# Patient Record
Sex: Male | Born: 1995 | Race: White | Hispanic: No | Marital: Single | Smoking: Never smoker
Health system: Southern US, Community
[De-identification: ages and names within clinical notes are randomized; demographics above are authoritative.]

## PROBLEM LIST (undated history)

## (undated) DIAGNOSIS — F840 Autistic disorder: Secondary | ICD-10-CM

## (undated) DIAGNOSIS — G4730 Sleep apnea, unspecified: Secondary | ICD-10-CM

## (undated) DIAGNOSIS — E669 Obesity, unspecified: Secondary | ICD-10-CM

## (undated) DIAGNOSIS — K859 Acute pancreatitis without necrosis or infection, unspecified: Secondary | ICD-10-CM

## (undated) DIAGNOSIS — R7303 Prediabetes: Secondary | ICD-10-CM

## (undated) DIAGNOSIS — I10 Essential (primary) hypertension: Secondary | ICD-10-CM

## (undated) DIAGNOSIS — R569 Unspecified convulsions: Secondary | ICD-10-CM

## (undated) DIAGNOSIS — F952 Tourette's disorder: Secondary | ICD-10-CM

## (undated) DIAGNOSIS — K760 Fatty (change of) liver, not elsewhere classified: Secondary | ICD-10-CM

## (undated) DIAGNOSIS — G2569 Other tics of organic origin: Secondary | ICD-10-CM

## (undated) DIAGNOSIS — J189 Pneumonia, unspecified organism: Secondary | ICD-10-CM

## (undated) HISTORY — DX: Other tics of organic origin: G25.69

## (undated) HISTORY — PX: TONSILLECTOMY: SUR1361

## (undated) HISTORY — DX: Autistic disorder: F84.0

---
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
## 2018-08-15 ENCOUNTER — Other Ambulatory Visit: Payer: Self-pay

## 2018-08-15 DIAGNOSIS — Z20822 Contact with and (suspected) exposure to covid-19: Secondary | ICD-10-CM

## 2018-08-17 LAB — NOVEL CORONAVIRUS, NAA: SARS-CoV-2, NAA: NOT DETECTED

## 2018-08-22 ENCOUNTER — Telehealth: Payer: Self-pay

## 2018-08-22 NOTE — Telephone Encounter (Signed)
Patient's mother advised his COVID 65 test result is negative.

## 2018-10-31 ENCOUNTER — Encounter: Payer: Self-pay | Admitting: Gastroenterology

## 2018-11-06 ENCOUNTER — Telehealth: Payer: Self-pay | Admitting: Gastroenterology

## 2018-11-06 NOTE — Telephone Encounter (Signed)
Attempted to reach patient/mother at phone number given-no answer on [phone].  Attempted to reach patient/mother at phone number given-left message for patient/mother to call back to the office at [phone];

## 2018-11-06 NOTE — Telephone Encounter (Signed)
Thanks for the heads up.  Vivien Rota, it looks like you are working with me this Friday morning- any way you could request records form [facility] hospital? Thanks-JLL

## 2018-11-06 NOTE — Telephone Encounter (Signed)
Pt's guardian called again requesting for an appt ASAP.

## 2018-11-06 NOTE — Telephone Encounter (Signed)
Please review previous message and advise Note: APP's do not have any availability until later in the week next week (RN checked schedule)

## 2018-11-06 NOTE — Telephone Encounter (Signed)
Patient's mother-[name]- called into office- RN explained to her that the patient cannot be seen today or tomorrow as Dr. Lyndel Safe does not have any openings any sooner than her son is scheduled-offered for her son to be seen by an APP on 11/09/2018 at 10:30 -[name] was appreciative of this appt; [name] reports that Dr. Ovid Curd had referred her to our office due to the patient needing to be seen immediately;   RN also informed [name] that she is welcome to take her son the the [facility] ER to be seen for immediate evaluation if she feels he needs to be seen sooner; [name] reports she has taken her son to [facility] ER numerous times; [name] was informed that this appt w/ APP Anderson Malta is at the [facility] office; [name] verbalized understanding of information/instructions; [name] was advised to call back to the office if further questions/concerns arise;   Anderson Malta -FYI=please make sure you get the records from [facility] to review as the patient's mother is assertive with [place] "knowing what all has happened to her son while in the emergency department-he needs to have the light down his throat to see what is going on-this is what Dr. Ovid Curd said he needed";

## 2018-11-06 NOTE — Telephone Encounter (Signed)
Referral MD (Dr. Lerry Paterson) requested to speak to Dr. Lyndel Safe regarding pt.  [phone]

## 2018-11-06 NOTE — Telephone Encounter (Signed)
Pt's mother called and said that her son has been vomiting all day and that there is a hernia- Not using the restroom and has lost weight. She took him to the emergency room this past week and that they did not really help him. She is trying to see if he can be seen today- she said she is worried about him and really wants him seen now that she does not think he will be okay til the 29th to his appt.

## 2018-11-07 ENCOUNTER — Encounter (HOSPITAL_COMMUNITY): Payer: Self-pay

## 2018-11-07 ENCOUNTER — Other Ambulatory Visit: Payer: Self-pay

## 2018-11-07 ENCOUNTER — Telehealth: Payer: Self-pay | Admitting: Gastroenterology

## 2018-11-07 ENCOUNTER — Observation Stay (HOSPITAL_COMMUNITY)
Admission: EM | Admit: 2018-11-07 | Discharge: 2018-11-08 | Disposition: A | Discharge status: Home / Self Care | Payer: Medicaid Other | Attending: Internal Medicine | Admitting: Internal Medicine

## 2018-11-07 DIAGNOSIS — E860 Dehydration: Secondary | ICD-10-CM | POA: Diagnosis not present

## 2018-11-07 DIAGNOSIS — F79 Unspecified intellectual disabilities: Secondary | ICD-10-CM | POA: Insufficient documentation

## 2018-11-07 DIAGNOSIS — K2100 Gastro-esophageal reflux disease with esophagitis, without bleeding: Secondary | ICD-10-CM | POA: Insufficient documentation

## 2018-11-07 DIAGNOSIS — Z6839 Body mass index (BMI) 39.0-39.9, adult: Secondary | ICD-10-CM | POA: Insufficient documentation

## 2018-11-07 DIAGNOSIS — I10 Essential (primary) hypertension: Secondary | ICD-10-CM | POA: Insufficient documentation

## 2018-11-07 DIAGNOSIS — Z8249 Family history of ischemic heart disease and other diseases of the circulatory system: Secondary | ICD-10-CM | POA: Diagnosis not present

## 2018-11-07 DIAGNOSIS — K5900 Constipation, unspecified: Secondary | ICD-10-CM | POA: Diagnosis not present

## 2018-11-07 DIAGNOSIS — R1110 Vomiting, unspecified: Secondary | ICD-10-CM | POA: Diagnosis present

## 2018-11-07 DIAGNOSIS — G4730 Sleep apnea, unspecified: Secondary | ICD-10-CM | POA: Diagnosis not present

## 2018-11-07 DIAGNOSIS — Z79899 Other long term (current) drug therapy: Secondary | ICD-10-CM | POA: Insufficient documentation

## 2018-11-07 DIAGNOSIS — F840 Autistic disorder: Secondary | ICD-10-CM | POA: Diagnosis not present

## 2018-11-07 DIAGNOSIS — R748 Abnormal levels of other serum enzymes: Secondary | ICD-10-CM | POA: Diagnosis not present

## 2018-11-07 DIAGNOSIS — K228 Other specified diseases of esophagus: Secondary | ICD-10-CM | POA: Diagnosis not present

## 2018-11-07 DIAGNOSIS — R1084 Generalized abdominal pain: Secondary | ICD-10-CM | POA: Insufficient documentation

## 2018-11-07 DIAGNOSIS — Z20828 Contact with and (suspected) exposure to other viral communicable diseases: Secondary | ICD-10-CM | POA: Insufficient documentation

## 2018-11-07 DIAGNOSIS — E669 Obesity, unspecified: Secondary | ICD-10-CM | POA: Diagnosis not present

## 2018-11-07 DIAGNOSIS — K760 Fatty (change of) liver, not elsewhere classified: Secondary | ICD-10-CM | POA: Diagnosis not present

## 2018-11-07 DIAGNOSIS — G40909 Epilepsy, unspecified, not intractable, without status epilepticus: Secondary | ICD-10-CM | POA: Insufficient documentation

## 2018-11-07 DIAGNOSIS — R451 Restlessness and agitation: Secondary | ICD-10-CM | POA: Diagnosis not present

## 2018-11-07 DIAGNOSIS — R112 Nausea with vomiting, unspecified: Principal | ICD-10-CM | POA: Insufficient documentation

## 2018-11-07 LAB — CBC WITH DIFFERENTIAL/PLATELET
Abs Immature Granulocytes: 0.01 10*3/uL (ref 0.00–0.07)
Basophils Absolute: 0 10*3/uL (ref 0.0–0.1)
Basophils Relative: 1 %
Eosinophils Absolute: 0.1 10*3/uL (ref 0.0–0.5)
Eosinophils Relative: 1 %
HCT: 45.1 % (ref 39.0–52.0)
Hemoglobin: 14.4 g/dL (ref 13.0–17.0)
Immature Granulocytes: 0 %
Lymphocytes Relative: 41 %
Lymphs Abs: 3.1 10*3/uL (ref 0.7–4.0)
MCH: 27.7 pg (ref 26.0–34.0)
MCHC: 31.9 g/dL (ref 30.0–36.0)
MCV: 86.7 fL (ref 80.0–100.0)
Monocytes Absolute: 0.7 10*3/uL (ref 0.1–1.0)
Monocytes Relative: 10 %
Neutro Abs: 3.5 10*3/uL (ref 1.7–7.7)
Neutrophils Relative %: 47 %
Platelets: 484 10*3/uL — ABNORMAL HIGH (ref 150–400)
RBC: 5.2 MIL/uL (ref 4.22–5.81)
RDW: 12.3 % (ref 11.5–15.5)
WBC: 7.5 10*3/uL (ref 4.0–10.5)
nRBC: 0 % (ref 0.0–0.2)

## 2018-11-07 LAB — URINALYSIS, ROUTINE W REFLEX MICROSCOPIC
Bacteria, UA: NONE SEEN
Glucose, UA: NEGATIVE mg/dL
Hgb urine dipstick: NEGATIVE
Ketones, ur: NEGATIVE mg/dL
Leukocytes,Ua: NEGATIVE
Nitrite: NEGATIVE
Protein, ur: 30 mg/dL — AB
Specific Gravity, Urine: 1.027 (ref 1.005–1.030)
pH: 5 (ref 5.0–8.0)

## 2018-11-07 LAB — COMPREHENSIVE METABOLIC PANEL
ALT: 98 U/L — ABNORMAL HIGH (ref 0–44)
AST: 65 U/L — ABNORMAL HIGH (ref 15–41)
Albumin: 4.5 g/dL (ref 3.5–5.0)
Alkaline Phosphatase: 79 U/L (ref 38–126)
Anion gap: 12 (ref 5–15)
BUN: 8 mg/dL (ref 6–20)
CO2: 26 mmol/L (ref 22–32)
Calcium: 9.5 mg/dL (ref 8.9–10.3)
Chloride: 99 mmol/L (ref 98–111)
Creatinine, Ser: 0.81 mg/dL (ref 0.61–1.24)
GFR calc Af Amer: >60 mL/min (ref >60)
GFR calc non Af Amer: >60 mL/min (ref >60)
Glucose, Bld: 107 mg/dL — ABNORMAL HIGH (ref 70–99)
Potassium: 3.9 mmol/L (ref 3.5–5.1)
Sodium: 137 mmol/L (ref 135–145)
Total Bilirubin: 1.3 mg/dL — ABNORMAL HIGH (ref 0.3–1.2)
Total Protein: 7.6 g/dL (ref 6.5–8.1)

## 2018-11-07 LAB — SARS CORONAVIRUS 2 (TAT 6-24 HRS): SARS Coronavirus 2: NEGATIVE

## 2018-11-07 LAB — LIPASE, BLOOD: Lipase: 21 U/L (ref 11–51)

## 2018-11-07 MED ORDER — SODIUM CHLORIDE 0.9 % IV SOLN
100.0000 mg | Freq: Two times a day (BID) | INTRAVENOUS | Status: DC
  Administered 2018-11-07 – 2018-11-08 (×2): 100 mg via INTRAVENOUS
  Filled 2018-11-07 (×4): qty 10

## 2018-11-07 MED ORDER — ENOXAPARIN SODIUM 40 MG/0.4ML ~~LOC~~ SOLN
40.0000 mg | SUBCUTANEOUS | Status: DC
  Administered 2018-11-07: 40 mg via SUBCUTANEOUS
  Filled 2018-11-07: qty 0.4

## 2018-11-07 MED ORDER — LORAZEPAM 2 MG/ML IJ SOLN: 1.0000 mg | INTRAMUSCULAR | Status: DC | PRN

## 2018-11-07 MED ORDER — CLOBAZAM 10 MG PO TABS
60.0000 mg | ORAL_TABLET | Freq: Two times a day (BID) | ORAL | Status: DC
  Administered 2018-11-07 – 2018-11-08 (×2): 60 mg via ORAL
  Filled 2018-11-07 (×2): qty 6

## 2018-11-07 MED ORDER — ONDANSETRON HCL 4 MG/2ML IJ SOLN
4.0000 mg | Freq: Once | INTRAMUSCULAR | Status: AC
  Administered 2018-11-07: 4 mg via INTRAVENOUS
  Filled 2018-11-07: qty 2

## 2018-11-07 MED ORDER — ONDANSETRON HCL 4 MG/2ML IJ SOLN: 4.0000 mg | Freq: Four times a day (QID) | INTRAMUSCULAR | Status: DC | PRN

## 2018-11-07 MED ORDER — PANTOPRAZOLE SODIUM 40 MG IV SOLR
40.0000 mg | INTRAVENOUS | Status: DC
  Administered 2018-11-07: 40 mg via INTRAVENOUS
  Filled 2018-11-07: qty 40

## 2018-11-07 MED ORDER — LORAZEPAM 2 MG/ML IJ SOLN
1.0000 mg | INTRAMUSCULAR | Status: DC | PRN
  Administered 2018-11-07 – 2018-11-08 (×3): 1 mg via INTRAVENOUS
  Filled 2018-11-07 (×3): qty 1

## 2018-11-07 MED ORDER — SODIUM CHLORIDE 0.9 % IV SOLN
INTRAVENOUS | Status: AC
  Administered 2018-11-07 – 2018-11-08 (×2): via INTRAVENOUS

## 2018-11-07 MED ORDER — HYDROMORPHONE HCL 1 MG/ML IJ SOLN
1.0000 mg | Freq: Once | INTRAMUSCULAR | Status: AC
  Administered 2018-11-07: 1 mg via INTRAVENOUS
  Filled 2018-11-07: qty 1

## 2018-11-07 MED ORDER — SODIUM CHLORIDE 0.9 % IV BOLUS
1000.0000 mL | Freq: Once | INTRAVENOUS | Status: AC
  Administered 2018-11-07: 15:00:00 1000 mL via INTRAVENOUS

## 2018-11-07 MED ORDER — SODIUM CHLORIDE 0.9 % IV BOLUS
1000.0000 mL | Freq: Once | INTRAVENOUS | Status: AC
  Administered 2018-11-07: 1000 mL via INTRAVENOUS

## 2018-11-07 MED ORDER — DIAZEPAM 20 MG RE GEL: 20.0000 mg | Freq: Every day | RECTAL | Status: DC | PRN

## 2018-11-07 NOTE — Consult Note (Signed)
Referring Provider:   [facility]        Primary Care Physician:  [facility] Primary Gastroenterologist:  Has appt with [place] this week ( establish care)           Reason for Consultation:    Abdominal pain, nausea, vomiting.                ASSESSMENT /  PLAN    21. 23 yo male with severe autism, mental retardation, seizure disorder, obesity in ED with a one month history of progressive nausea, vomiting, generalized abdominal pain and constipation. Two CT scans at outside facility have been unremarkable. Lipase normal. Mild elevation in liver tests, likely unrelated especially given unremarkable CT scan. Hospitalist has obtained records and will be in hard chart.  --Etiology of symptoms unclear at this point. Will proceed with EGD tomorrow. The risks and benefits of EGD were discussed with the patient's Mother and she agrees to proceed.  -Need to address constipation but it will be difficult right now. He isn't tolerating PO and furthermore he won't drink Miralax. We could crush Senokot and put in fluid which mother thinks might work (when he is tolerating PO).  --prn anti-emetics. --Spoke with Hospitalist and he will order anxiolytic to use as needed. Patient is pulling at IV, he is agitated.  --IV hydration. Received 2 L bolus, getting 125 ml / hr maintenance.   2. Abnormal liver tests, mild. Tbili 1.3, mildly elevated transaminases. History of hepatic steatosis.  --Repeat liver tests tomorrow.  3. Hypertension. Home BP meds on hold. Normotensive at present.   4. Hx of seizures, on antiepileptic at home. Need IV seizure med while here? Will ask Hospitalist  HPI:     Aaron Vega is a 23 y.o. male with a PMH of severe autism / retardation, seizure disorder,  Obesity, pancreatitis at age 45. He is unable to verbally communicate. Mother at bedside. A month ago patient started having intermittent nausea, vomiting. Mother detected that he was having abdominal  pain and around the same time was struggling with constipation. He would typically have 1-2 BMs a day but over the last month has been having a small BM every several days. On the toilet patient is sticking finger down throat to induce vomiting. She has used suppositories for the constipation. but they are non-productive. His symptoms have gotten progressively worse over the last month. He has had two CT scans this month ( outside facility) which have been unremarkable. Patient has an appt with [place] in two days but due to inability to keep anything down Mother brought him to ED.     Past Medical History:  Diagnosis Date  . Autistic disorder, current or active state   . Fatty liver   . Hypertension   . Obesity   . Pancreatitis    at age 36 years  . Pre-diabetes   . Seizures ([place])    as of 11/23/15 - no seizures for 3 month  . Sleep apnea    not on cpap (can't tolerate)  . Tics of organic origin   . Tourette syndrome     Past Surgical History:  Procedure Laterality Date  . RADIOLOGY WITH ANESTHESIA N/A 11/24/2015   Procedure: CT SCAN ABDOMEN AND PELVIS WITH CONTRAST;  Surgeon: Medication Radiologist, MD;  Location: [facility];  Service: Radiology;  Laterality: N/A;  . TONSILLECTOMY     and adenoidectomy    Prior to Admission medications   Medication Sig Start Date  End Date Taking? Authorizing Provider  citalopram (CELEXA) 40 MG tablet Take 40 mg by mouth daily. 04/24/14  Yes [provider]  cloBAZam (ONFI) 20 MG tablet Take 60 mg by mouth 2 (two) times daily. 10/16/18  Yes [provider]  diazepam (DIASAT) 20 MG GEL Place 20 mg rectally daily as needed (for seizures lasting more than 5 minutes.).  07/03/13  Yes [provider]  diazepam (VALIUM) 5 MG tablet Take 7.5 mg by mouth every morning. May give 0.5 tablet (2.5mg ) as needed for agitation/anxiety 10/15/18  Yes [provider]  lisinopril (PRINIVIL,ZESTRIL) 20 MG tablet Take 20 mg by mouth daily. Take  with 5mg  tablet for a total dose of 25mg  04/15/14  Yes [provider]  lisinopril (ZESTRIL) 5 MG tablet Take 5 mg by mouth daily. Take with 20mg  tablet for a total dose of 25mg  10/14/18  Yes [provider]  Melatonin 5 MG TABS Take 5 mg by mouth at bedtime.   Yes [provider]  Midazolam (NAYZILAM) 5 MG/0.1ML SOLN Place 5 mg into the nose daily as needed (seizure).   Yes [provider]  nystatin (MYCOSTATIN/NYSTOP) 100000 UNIT/GM POWD Apply 1 application topically daily. Neck, crevices, etc. 04/23/14  Yes [provider]  VIMPAT 10 MG/ML oral solution Take 10 mLs by mouth 2 (two) times daily. 10/28/18  Yes [provider]    Current Facility-Administered Medications  Medication Dose Route Frequency Provider Last Rate Last Dose  . 0.9 %  sodium chloride infusion   Intravenous Continuous Dhungel, Nishant, MD      . LORazepam (ATIVAN) injection 1 mg  1 mg Intravenous Q4H PRN Dhungel, Nishant, MD       Current Outpatient Medications  Medication Sig Dispense Refill  . citalopram (CELEXA) 40 MG tablet Take 40 mg by mouth daily.  4  . cloBAZam (ONFI) 20 MG tablet Take 60 mg by mouth 2 (two) times daily.    . diazepam (DIASAT) 20 MG GEL Place 20 mg rectally daily as needed (for seizures lasting more than 5 minutes.).     Marland Kitchen. diazepam (VALIUM) 5 MG tablet Take 7.5 mg by mouth every morning. May give 0.5 tablet (2.5mg ) as needed for agitation/anxiety    . lisinopril (PRINIVIL,ZESTRIL) 20 MG tablet Take 20 mg by mouth daily. Take with 5mg  tablet for a total dose of 25mg   9  . lisinopril (ZESTRIL) 5 MG tablet Take 5 mg by mouth daily. Take with 20mg  tablet for a total dose of 25mg     . Melatonin 5 MG TABS Take 5 mg by mouth at bedtime.    . Midazolam (NAYZILAM) 5 MG/0.1ML SOLN Place 5 mg into the nose daily as needed (seizure).    . nystatin (MYCOSTATIN/NYSTOP) 100000 UNIT/GM POWD Apply 1 application topically daily. Neck, crevices, etc.  2  . VIMPAT  10 MG/ML oral solution Take 10 mLs by mouth 2 (two) times daily.      Allergies as of 11/07/2018  . (No Known Allergies)    Family History  Problem Relation Age of Onset  . Seizures Maternal Aunt   . Anxiety disorder Paternal Uncle   . Depression Paternal Uncle   . Bipolar disorder Paternal Uncle   . Seizures Cousin        Maternal 1st and 2nd cousins have seizures  . Anxiety disorder Other        Paternal fhx  . Depression Other        Paternal fhx  .  Hypertension Other        Paternal fhx  . Bipolar disorder Other        Paternal fhx    Social History   Socioeconomic History  . Marital status: Single    Spouse name: Not on file  . Number of children: Not on file  . Years of education: Not on file  . Highest education level: Not on file  Occupational History  . Not on file  Social Needs  . Financial resource strain: Not on file  . Food insecurity    Worry: Not on file    Inability: Not on file  . Transportation needs    Medical: Not on file    Non-medical: Not on file  Tobacco Use  . Smoking status: Never Smoker  . Smokeless tobacco: Never Used  Substance and Sexual Activity  . Alcohol use: Never    Frequency: Never  . Drug use: Never  . Sexual activity: Not on file  Lifestyle  . Physical activity    Days per week: Not on file    Minutes per session: Not on file  . Stress: Not on file  Relationships  . Social Musician on phone: Not on file    Gets together: Not on file    Attends religious service: Not on file    Active member of club or organization: Not on file    Attends meetings of clubs or organizations: Not on file    Relationship status: Not on file  . Intimate partner violence    Fear of current or ex partner: Not on file    Emotionally abused: Not on file    Physically abused: Not on file    Forced sexual activity: Not on file  Other Topics Concern  . Not on file  Social History Narrative  . Not on file    Review of  Systems: All systems reviewed and negative except where noted in HPI.  Physical Exam: Vital signs in last 24 hours: Temp:  [99.3 F (37.4 C)] 99.3 F (37.4 C) (10/14 1110) Pulse Rate:  [104-124] 104 (10/14 1536) Resp:  [16-22] 22 (10/14 1536) BP: (123-149)/(56-93) 123/93 (10/14 1536) SpO2:  [98 %-100 %] 98 % (10/14 1536)   General:   Alert, obese male in NAD Psych:  cooperative. Follows most commands but seems agitated Eyes:  Pupils equal, sclera clear, no icterus.   Conjunctiva pink. Ears:  Normal auditory acuity. Nose:  No deformity, discharge,  or lesions. Neck:  Supple; no masses Lungs:  Clear throughout to auscultation.   No wheezes, crackles, or rhonchi.  Heart:  Regular rate and rhythm; no murmurs, no lower extremity edema Abdomen:  Soft, obese, non-distended, appears nontender, BS active, no palp mass   Rectal:  Deferred  Msk:  Symmetrical without gross deformities. . Neurologic:  Alert and  oriented x4;  grossly normal neurologically. Skin:  Intact without significant lesions or rashes.   Intake/Output from previous day: No intake/output data recorded. Intake/Output this shift: Total I/O In: 1000 [IV Piggyback:1000] Out: -   Lab Results: Recent Labs    11/07/18 1253  WBC 7.5  HGB 14.4  HCT 45.1  PLT 484*   BMET Recent Labs    11/07/18 1253  NA 137  K 3.9  CL 99  CO2 26  GLUCOSE 107*  BUN 8  CREATININE 0.81  CALCIUM 9.5   LFT Recent Labs    11/07/18 1253  PROT 7.6  ALBUMIN 4.5  AST 65*  ALT 98*  ALKPHOS 79  BILITOT 1.3*   PT/INR No results for input(s): LABPROT, INR in the last 72 hours. Hepatitis Panel No results for input(s): HEPBSAG, HCVAB, HEPAIGM, HEPBIGM in the last 72 hours.   . CBC Latest Ref Rng & Units 11/07/2018 11/24/2015 05/03/2014  WBC 4.0 - 10.5 K/uL 7.5 9.3 15.5(H)  Hemoglobin 13.0 - 17.0 g/dL 64.6 12.8(L) 12.5  Hematocrit 39.0 - 52.0 % 45.1 39.2 39.0  Platelets 150 - 400 K/uL 484(H) 348 430(H)    . CMP Latest  Ref Rng & Units 11/07/2018 11/24/2015 05/03/2014  Glucose 70 - 99 mg/dL 803(O) 122(Q) 825(O)  BUN 6 - 20 mg/dL 8 9 11   Creatinine 0.61 - 1.24 mg/dL 0.37) 0.48(G  Sodium 135 - 145 mmol/L 137 137 138  Potassium 3.5 - 5.1 mmol/L 3.9 3.7 3.7  Chloride 98 - 111 mmol/L 99 106 107  CO2 22 - 32 mmol/L 26 25 18(L)  Calcium 8.9 - 10.3 mg/dL 9.5 9.5 9.6  Total Protein 6.5 - 8.1 g/dL 7.6 - 6.7  Total Bilirubin 0.3 - 1.2 mg/dL 8.91) - 0.3  Alkaline Phos 38 - 126 U/L 79 - 108  AST 15 - 41 U/L 65(H) - 31  ALT 0 - 44 U/L 98(H) - 47   Studies/Results: No results found.  Active Problems:   Autistic disorder   Intractable nausea and vomiting   Obesity (BMI 30-39.9)   Seizure disorder (HCC)    6.9(I, NP-C @  11/07/2018, 4:58 PM

## 2018-11-07 NOTE — Telephone Encounter (Signed)
Attempted to reach patient/patient's mother at 1:18 pm- no answer (not a working number)-[phone]

## 2018-11-07 NOTE — ED Provider Notes (Signed)
[facility] DEPT Provider Note   CSN: [id number] Arrival date & time: 11/07/18  1036   LEVEL 5 CAVEAT - AUTISM/MENTAL DELAY  History   Chief Complaint Chief Complaint  Patient presents with  . Emesis  . Abdominal Pain    HPI Aaron Vega is a 23 y.o. male.     HPI  23 year old male presents with vomiting.  History is taken from mom based on the patient's autism disorder.  Patient has been sick for a couple months with progressively worsening vomiting.  He has been vomiting during these last couple months but now it is becoming pretty much constant.  He often is sticking his finger down his throat to vomit.  He is only had one small bowel movement in the last few weeks.  Has been to [facility] a couple times, most recently 4 days ago.  States he had a CT scan that showed an abdominal hernia but also a "blockage".  States that he was referred to gastroenterology, Dr. Lyndel Safe.  They are unable to see him until later this month.  Patient and family told to come to the ER and GI would see them here.  States next up would be endoscopy.  The patient is still passing gas. Has had low grade fevers. No cough. Mom states he's had multiple xrays and CT scans "from head to toe".  Past Medical History:  Diagnosis Date  . Autistic disorder, current or active state   . Fatty liver   . Hypertension   . Obesity   . Pancreatitis    at age 41 years  . Pre-diabetes   . Seizures ([place])    as of 11/23/15 - no seizures for 3 month  . Sleep apnea    not on cpap (can't tolerate)  . Tics of organic origin   . Tourette syndrome     Patient Active Problem List   Diagnosis Date Noted  . Intractable nausea and vomiting 11/07/2018  . Tics of organic origin 09/12/2012  . Autistic disorder, current or active state 09/12/2012    Past Surgical History:  Procedure Laterality Date  . RADIOLOGY WITH ANESTHESIA N/A 11/24/2015   Procedure: CT SCAN ABDOMEN AND PELVIS WITH  CONTRAST;  Surgeon: Medication Radiologist, MD;  Location: [facility];  Service: Radiology;  Laterality: N/A;  . TONSILLECTOMY     and adenoidectomy        Home Medications    Prior to Admission medications   Medication Sig Start Date End Date Taking? Authorizing Provider  citalopram (CELEXA) 40 MG tablet Take 40 mg by mouth daily. 04/24/14  Yes [provider]  cloBAZam (ONFI) 20 MG tablet Take 60 mg by mouth 2 (two) times daily. 10/16/18  Yes [provider]  diazepam (DIASAT) 20 MG GEL Place 20 mg rectally daily as needed (for seizures lasting more than 5 minutes.).  07/03/13  Yes [provider]  diazepam (VALIUM) 5 MG tablet Take 7.5 mg by mouth every morning. May give 0.5 tablet (2.5mg ) as needed for agitation/anxiety 10/15/18  Yes [provider]  lisinopril (PRINIVIL,ZESTRIL) 20 MG tablet Take 20 mg by mouth daily. Take with 5mg  tablet for a total dose of 25mg  04/15/14  Yes [provider]  lisinopril (ZESTRIL) 5 MG tablet Take 5 mg by mouth daily. Take with 20mg  tablet for a total dose of 25mg  10/14/18  Yes [provider]  Melatonin 5 MG TABS Take 5 mg by mouth at bedtime.   Yes [provider]  Midazolam (NAYZILAM) 5 MG/0.1ML SOLN Place 5 mg into the nose daily as needed (seizure).   Yes [provider]  nystatin (MYCOSTATIN/NYSTOP) 100000 UNIT/GM POWD Apply 1 application topically daily. Neck, crevices, etc. 04/23/14  Yes [provider]  VIMPAT 10 MG/ML oral solution Take 10 mLs by mouth 2 (two) times daily. 10/28/18  Yes [provider]    Family History Family History  Problem Relation Age of Onset  . Seizures Maternal Aunt   . Anxiety disorder Paternal Uncle   . Depression Paternal Uncle   . Bipolar disorder Paternal Uncle   . Seizures Cousin        Maternal 1st and 2nd cousins have seizures  . Anxiety disorder Other        Paternal fhx  . Depression Other        Paternal fhx  .  Hypertension Other        Paternal fhx  . Bipolar disorder Other        Paternal fhx    Social History Social History   Tobacco Use  . Smoking status: Never Smoker  . Smokeless tobacco: Never Used  Substance Use Topics  . Alcohol use: Not on file  . Drug use: Not on file     Allergies   Patient has no known allergies.   Review of Systems Review of Systems  Unable to perform ROS: Patient nonverbal     Physical Exam Updated Vital Signs BP (!) 128/56 (BP Location: Left Wrist)   Pulse (!) 118   Temp 99.3 F (37.4 C) (Axillary)   Resp 20   SpO2 98%   Physical Exam Vitals signs and nursing note reviewed.  Constitutional:      Appearance: He is well-developed. He is obese.  HENT:     Head: Normocephalic and atraumatic.     Right Ear: External ear normal.     Left Ear: External ear normal.     Nose: Nose normal.  Eyes:     General:        Right eye: No discharge.        Left eye: No discharge.  Neck:     Musculoskeletal: Neck supple.  Cardiovascular:     Rate and Rhythm: Regular rhythm. Tachycardia present.     Heart sounds: Normal heart sounds.  Pulmonary:     Effort: Pulmonary effort is normal.     Breath sounds: Normal breath sounds.  Abdominal:     Palpations: Abdomen is soft.     Tenderness: There is abdominal tenderness.     Comments: Limited exam, as patient resists during palpation but does grimace.  Skin:    General: Skin is warm and dry.  Neurological:     Mental Status: He is alert.  Psychiatric:        Mood and Affect: Mood is not anxious.      ED Treatments / Results  Labs (all labs ordered are listed, but only abnormal results are displayed) Labs Reviewed  COMPREHENSIVE METABOLIC PANEL - Abnormal; Notable for the following components:      Result Value   Glucose, Bld 107 (*)    AST 65 (*)    ALT 98 (*)    Total Bilirubin 1.3 (*)    All other components within normal limits  CBC WITH DIFFERENTIAL/PLATELET - Abnormal; Notable for  the following components:   Platelets 484 (*)    All other components within normal limits  SARS CORONAVIRUS 2 (TAT  6-24 HRS)  LIPASE, BLOOD  CBC WITH DIFFERENTIAL/PLATELET  URINALYSIS, ROUTINE W REFLEX MICROSCOPIC    EKG None  Radiology No results found.  Procedures Procedures (including critical care time)  Medications Ordered in ED Medications  sodium chloride 0.9 % bolus 1,000 mL (has no administration in time range)  sodium chloride 0.9 % bolus 1,000 mL (0 mLs Intravenous Stopped 11/07/18 1501)  ondansetron (ZOFRAN) injection 4 mg (4 mg Intravenous Given 11/07/18 1306)  HYDROmorphone (DILAUDID) injection 1 mg (1 mg Intravenous Given 11/07/18 1307)     Initial Impression / Assessment and Plan / ED Course  I have reviewed the triage vital signs and the nursing notes.  Pertinent labs & imaging results that were available during my care of the patient were reviewed by me and considered in my medical decision making (see chart for details).        Patient has not had any vomiting here.  I have reviewed his outside records, the patient has had 2 different CT scans, 10/10 and 10/5.  Both do not show any obstruction, obvious hernia, or acute abnormalities according to the reads.  I do not think he needs a third CT scan.  His labs are overall similar to outside records including mild LFT abnormalities.  I discussed with lobe our GI, Lucrezia Europe, who has arranged for patient to get an EGD tomorrow.  Dr. Gonzella Lex will admit.  He is noted to still be tachycardic despite resting comfortably after first liter of IV fluids I think there is some dehydration component and he will be given more fluids and observed.  Final Clinical Impressions(s) / ED Diagnoses   Final diagnoses:  Vomiting in adult  Dehydration    ED Discharge Orders    None       Pricilla Loveless, MD 11/07/18 [phone]

## 2018-11-07 NOTE — Telephone Encounter (Signed)
Records request sent to [facility] for all ER visits.

## 2018-11-07 NOTE — ED Notes (Signed)
ED TO INPATIENT HANDOFF REPORT  Name/Age/Gender Aaron Vega 23 y.o. male  Code Status   Home/SNF/Other Rehab  Chief Complaint constipation / emesis   Level of Care/Admitting Diagnosis ED Disposition    ED Disposition Condition Comment   Admit  [facility] [[id number]]  Level of Care: Med-Surg [16]  Covid Evaluation: N/A  Diagnosis: Intractable nausea and vomiting [720114]  Admitting Physician: Eddie NorthHUNGEL, NISHANT [phone][[id number]]  Attending Physician: Eddie NorthHUNGEL, NISHANT [[id number]]  PT Class (Do Not Modify): Observation [104]  PT Acc Code (Do Not Modify): Observation [10022]       Medical History Past Medical History:  Diagnosis Date  . Autistic disorder, current or active state   . Fatty liver   . Hypertension   . Obesity   . Pancreatitis    at age 28 years  . Pre-diabetes   . Seizures (HCC)    as of 11/23/15 - no seizures for 3 month  . Sleep apnea    not on cpap (can't tolerate)  . Tics of organic origin   . Tourette syndrome     Allergies No Known Allergies  IV Location/Drains/Wounds Patient Lines/Drains/Airways Status   Active Line/Drains/Airways    Name:   Placement date:   Placement time:   Site:   Days:   Peripheral IV 11/07/18 Left Antecubital   11/07/18    1304    Antecubital   less than 1          Labs/Imaging Results for orders placed or performed during the hospital encounter of 11/07/18 (from the past 48 hour(s))  Comprehensive metabolic panel     Status: Abnormal   Collection Time: 11/07/18 12:53 PM  Result Value Ref Range   Sodium 137 135 - 145 mmol/L   Potassium 3.9 3.5 - 5.1 mmol/L   Chloride 99 98 - 111 mmol/L   CO2 26 22 - 32 mmol/L   Glucose, Bld 107 (H) 70 - 99 mg/dL   BUN 8 6 - 20 mg/dL   Creatinine, Ser 9.600.81 0.61 - 1.24 mg/dL   Calcium 9.5 8.9 - 45.410.3 mg/dL   Total Protein 7.6 6.5 - 8.1 g/dL   Albumin 4.5 3.5 - 5.0 g/dL   AST 65 (H) 15 - 41 U/L   ALT 98 (H) 0 - 44 U/L   Alkaline Phosphatase 79 38 - 126  U/L   Total Bilirubin 1.3 (H) 0.3 - 1.2 mg/dL   GFR calc non Af Amer >60 >60 mL/min   GFR calc Af Amer >60 >60 mL/min   Anion gap 12 5 - 15    Comment: Performed at [facility], [address] [place], [place] [address]  Lipase, blood     Status: None   Collection Time: 11/07/18 12:53 PM  Result Value Ref Range   Lipase 21 11 - 51 U/L    Comment: Performed at [facility], [address] [place], [place] [address]  CBC with Differential     Status: Abnormal   Collection Time: 11/07/18 12:53 PM  Result Value Ref Range   WBC 7.5 4.0 - 10.5 K/uL   RBC 5.20 4.22 - 5.81 MIL/uL   Hemoglobin 14.4 13.0 - 17.0 g/dL   HCT 29.545.1 62.139.0 - 30.852.0 %   MCV 86.7 80.0 - 100.0 fL   MCH 27.7 26.0 - 34.0 pg   MCHC 31.9 30.0 - 36.0 g/dL   RDW 65.712.3 84.611.5 - 96.215.5 %   Platelets 484 (H) 150 - 400  K/uL   nRBC 0.0 0.0 - 0.2 %   Neutrophils Relative % 47 %   Neutro Abs 3.5 1.7 - 7.7 K/uL   Lymphocytes Relative 41 %   Lymphs Abs 3.1 0.7 - 4.0 K/uL   Monocytes Relative 10 %   Monocytes Absolute 0.7 0.1 - 1.0 K/uL   Eosinophils Relative 1 %   Eosinophils Absolute 0.1 0.0 - 0.5 K/uL   Basophils Relative 1 %   Basophils Absolute 0.0 0.0 - 0.1 K/uL   Immature Granulocytes 0 %   Abs Immature Granulocytes 0.01 0.00 - 0.07 K/uL    Comment: Performed at [facility], [place] [address] [place], [place] [address]  Urinalysis, Routine w reflex microscopic     Status: Abnormal   Collection Time: 11/07/18  3:30 PM  Result Value Ref Range   Color, Urine AMBER (A) YELLOW    Comment: BIOCHEMICALS MAY BE AFFECTED BY COLOR   APPearance CLOUDY (A) CLEAR   Specific Gravity, Urine 1.027 1.005 - 1.030   pH 5.0 5.0 - 8.0   Glucose, UA NEGATIVE NEGATIVE mg/dL   Hgb urine dipstick NEGATIVE NEGATIVE   Bilirubin Urine SMALL (A) NEGATIVE   Ketones, ur NEGATIVE NEGATIVE mg/dL   Protein, ur 30 (A) NEGATIVE mg/dL   Nitrite NEGATIVE NEGATIVE   Leukocytes,Ua NEGATIVE NEGATIVE    RBC / HPF 0-5 0 - 5 RBC/hpf   WBC, UA 11-20 0 - 5 WBC/hpf   Bacteria, UA NONE SEEN NONE SEEN   Squamous Epithelial / LPF 0-5 0 - 5   Mucus PRESENT    Hyaline Casts, UA PRESENT    Sperm, UA PRESENT     Comment: Performed at [facility], [place] [address], [place], [place] [address]   No results found.  Pending Labs Unresulted Labs (From admission, onward)    Start     Ordered   11/07/18 1601  Urine culture  ONCE - STAT,   STAT     11/07/18 1600   11/07/18 1445  SARS CORONAVIRUS 2 (TAT 6-24 HRS) Nasopharyngeal Nasopharyngeal Swab  (Asymptomatic/Tier 2 Patients Labs)  Once,   STAT    Question Answer Comment  Is this test for diagnosis or screening Screening   Symptomatic for COVID-19 as defined by CDC No   Hospitalized for COVID-19 No   Admitted to ICU for COVID-19 No   Previously tested for COVID-19 Yes   Resident in a congregate (group) care setting No   Employed in healthcare setting No      11/07/18 1444   11/07/18 1038  CBC with Differential  ONCE - STAT,   STAT     11/07/18 1038   Signed and Held  HIV Antibody (routine testing w rflx)  (HIV Antibody (Routine testing w reflex) panel)  Once,   R     Signed and Held   Signed and Held  HIV4GL Save Tube  (HIV Antibody (Routine testing w reflex) panel)  Once,   R     Signed and Held   Signed and Held  CBC  (enoxaparin (LOVENOX)    CrCl >/= 30 ml/min)  Once,   R    Comments: Baseline for enoxaparin therapy IF NOT ALREADY DRAWN.  Notify MD if PLT < 100 K.    Signed and Held   Signed and Held  Creatinine, serum  (enoxaparin (LOVENOX)    CrCl >/= 30 ml/min)  Once,   R    Comments: Baseline for enoxaparin therapy IF NOT  ALREADY DRAWN.    Signed and Held   Signed and Held  Creatinine, serum  (enoxaparin (LOVENOX)    CrCl >/= 30 ml/min)  Weekly,   R    Comments: while on enoxaparin therapy    Signed and Held   Signed and Held  Basic metabolic panel  Tomorrow morning,   R     Signed and Held   Signed and Held   Comprehensive metabolic panel  Tomorrow morning,   R     Signed and Held          Vitals/Pain Today's Vitals   11/07/18 1110 11/07/18 1446 11/07/18 1536 11/07/18 1800  BP: (!) 149/83 (!) 128/56 (!) 123/93 (!) 120/56  Pulse: (!) 124 (!) 118 (!) 104 (!) 119  Resp: 16 20 (!) 22 (!) 22  Temp: 99.3 F (37.4 C)     TempSrc: Axillary     SpO2: 100% 98% 98% 97%    Isolation Precautions No active isolations  Medications Medications  0.9 %  sodium chloride infusion (has no administration in time range)  LORazepam (ATIVAN) injection 1 mg (1 mg Intravenous Given 11/07/18 1659)  sodium chloride 0.9 % bolus 1,000 mL (0 mLs Intravenous Stopped 11/07/18 1501)  ondansetron (ZOFRAN) injection 4 mg (4 mg Intravenous Given 11/07/18 1306)  HYDROmorphone (DILAUDID) injection 1 mg (1 mg Intravenous Given 11/07/18 1307)  sodium chloride 0.9 % bolus 1,000 mL (1,000 mLs Intravenous New Bag/Given 11/07/18 1523)    Mobility walks

## 2018-11-07 NOTE — Telephone Encounter (Signed)
Pt's mother called stating that pt is at [facility], they told her that they have to wait for an hour and a half so pt can be seen. She is getting desesperate because patient has special needs and she states that he will not be able to wait that long. She would like a call back asap.

## 2018-11-07 NOTE — Telephone Encounter (Signed)
Pt's mother reported that she is taking pt to [facility] this morning.

## 2018-11-07 NOTE — Telephone Encounter (Signed)
Called [phone] and spoke with patient's mother- [name] verbalized she is better now that her son is in the ER and "they are keeping him overnight and are going to run some test and try to see what is going on with him"-[name] verbalized she is very appreciative for recommendations to take her son to the ER at [facility]; [name] was advised to call back to the office at [phone] should questions/concerns arise;  [name] verbalized understanding of information/instructions;

## 2018-11-07 NOTE — H&P (View-Only) (Signed)
Referring Provider:   [facility]        Primary Care Physician:  [facility] Primary Gastroenterologist:  Has appt with [place] this week ( establish care)           Reason for Consultation:    Abdominal pain, nausea, vomiting.                ASSESSMENT /  PLAN    21. 23 yo male with severe autism, mental retardation, seizure disorder, obesity in ED with a one month history of progressive nausea, vomiting, generalized abdominal pain and constipation. Two CT scans at outside facility have been unremarkable. Lipase normal. Mild elevation in liver tests, likely unrelated especially given unremarkable CT scan. Hospitalist has obtained records and will be in hard chart.  --Etiology of symptoms unclear at this point. Will proceed with EGD tomorrow. The risks and benefits of EGD were discussed with the patient's Mother and she agrees to proceed.  -Need to address constipation but it will be difficult right now. He isn't tolerating PO and furthermore he won't drink Miralax. We could crush Senokot and put in fluid which mother thinks might work (when he is tolerating PO).  --prn anti-emetics. --Spoke with Hospitalist and he will order anxiolytic to use as needed. Patient is pulling at IV, he is agitated.  --IV hydration. Received 2 L bolus, getting 125 ml / hr maintenance.   2. Abnormal liver tests, mild. Tbili 1.3, mildly elevated transaminases. History of hepatic steatosis.  --Repeat liver tests tomorrow.  3. Hypertension. Home BP meds on hold. Normotensive at present.   4. Hx of seizures, on antiepileptic at home. Need IV seizure med while here? Will ask Hospitalist  HPI:     Aaron Vega is a 23 y.o. male with a PMH of severe autism / retardation, seizure disorder,  Obesity, pancreatitis at age 45. He is unable to verbally communicate. Mother at bedside. A month ago patient started having intermittent nausea, vomiting. Mother detected that he was having abdominal  pain and around the same time was struggling with constipation. He would typically have 1-2 BMs a day but over the last month has been having a small BM every several days. On the toilet patient is sticking finger down throat to induce vomiting. She has used suppositories for the constipation. but they are non-productive. His symptoms have gotten progressively worse over the last month. He has had two CT scans this month ( outside facility) which have been unremarkable. Patient has an appt with [place] in two days but due to inability to keep anything down Mother brought him to ED.     Past Medical History:  Diagnosis Date  . Autistic disorder, current or active state   . Fatty liver   . Hypertension   . Obesity   . Pancreatitis    at age 36 years  . Pre-diabetes   . Seizures ([place])    as of 11/23/15 - no seizures for 3 month  . Sleep apnea    not on cpap (can't tolerate)  . Tics of organic origin   . Tourette syndrome     Past Surgical History:  Procedure Laterality Date  . RADIOLOGY WITH ANESTHESIA N/A 11/24/2015   Procedure: CT SCAN ABDOMEN AND PELVIS WITH CONTRAST;  Surgeon: Medication Radiologist, MD;  Location: [facility];  Service: Radiology;  Laterality: N/A;  . TONSILLECTOMY     and adenoidectomy    Prior to Admission medications   Medication Sig Start Date  End Date Taking? Authorizing Provider  citalopram (CELEXA) 40 MG tablet Take 40 mg by mouth daily. 04/24/14  Yes [provider]  cloBAZam (ONFI) 20 MG tablet Take 60 mg by mouth 2 (two) times daily. 10/16/18  Yes [provider]  diazepam (DIASAT) 20 MG GEL Place 20 mg rectally daily as needed (for seizures lasting more than 5 minutes.).  07/03/13  Yes [provider]  diazepam (VALIUM) 5 MG tablet Take 7.5 mg by mouth every morning. May give 0.5 tablet (2.5mg ) as needed for agitation/anxiety 10/15/18  Yes [provider]  lisinopril (PRINIVIL,ZESTRIL) 20 MG tablet Take 20 mg by mouth daily. Take  with 5mg  tablet for a total dose of 25mg  04/15/14  Yes [provider]  lisinopril (ZESTRIL) 5 MG tablet Take 5 mg by mouth daily. Take with 20mg  tablet for a total dose of 25mg  10/14/18  Yes [provider]  Melatonin 5 MG TABS Take 5 mg by mouth at bedtime.   Yes [provider]  Midazolam (NAYZILAM) 5 MG/0.1ML SOLN Place 5 mg into the nose daily as needed (seizure).   Yes [provider]  nystatin (MYCOSTATIN/NYSTOP) 100000 UNIT/GM POWD Apply 1 application topically daily. Neck, crevices, etc. 04/23/14  Yes [provider]  VIMPAT 10 MG/ML oral solution Take 10 mLs by mouth 2 (two) times daily. 10/28/18  Yes [provider]    Current Facility-Administered Medications  Medication Dose Route Frequency Provider Last Rate Last Dose  . 0.9 %  sodium chloride infusion   Intravenous Continuous Dhungel, Nishant, MD      . LORazepam (ATIVAN) injection 1 mg  1 mg Intravenous Q4H PRN Dhungel, Nishant, MD       Current Outpatient Medications  Medication Sig Dispense Refill  . citalopram (CELEXA) 40 MG tablet Take 40 mg by mouth daily.  4  . cloBAZam (ONFI) 20 MG tablet Take 60 mg by mouth 2 (two) times daily.    . diazepam (DIASAT) 20 MG GEL Place 20 mg rectally daily as needed (for seizures lasting more than 5 minutes.).     Marland Kitchen. diazepam (VALIUM) 5 MG tablet Take 7.5 mg by mouth every morning. May give 0.5 tablet (2.5mg ) as needed for agitation/anxiety    . lisinopril (PRINIVIL,ZESTRIL) 20 MG tablet Take 20 mg by mouth daily. Take with 5mg  tablet for a total dose of 25mg   9  . lisinopril (ZESTRIL) 5 MG tablet Take 5 mg by mouth daily. Take with 20mg  tablet for a total dose of 25mg     . Melatonin 5 MG TABS Take 5 mg by mouth at bedtime.    . Midazolam (NAYZILAM) 5 MG/0.1ML SOLN Place 5 mg into the nose daily as needed (seizure).    . nystatin (MYCOSTATIN/NYSTOP) 100000 UNIT/GM POWD Apply 1 application topically daily. Neck, crevices, etc.  2  . VIMPAT  10 MG/ML oral solution Take 10 mLs by mouth 2 (two) times daily.      Allergies as of 11/07/2018  . (No Known Allergies)    Family History  Problem Relation Age of Onset  . Seizures Maternal Aunt   . Anxiety disorder Paternal Uncle   . Depression Paternal Uncle   . Bipolar disorder Paternal Uncle   . Seizures Cousin        Maternal 1st and 2nd cousins have seizures  . Anxiety disorder Other        Paternal fhx  . Depression Other        Paternal fhx  .  Hypertension Other        Paternal fhx  . Bipolar disorder Other        Paternal fhx    Social History   Socioeconomic History  . Marital status: Single    Spouse name: Not on file  . Number of children: Not on file  . Years of education: Not on file  . Highest education level: Not on file  Occupational History  . Not on file  Social Needs  . Financial resource strain: Not on file  . Food insecurity    Worry: Not on file    Inability: Not on file  . Transportation needs    Medical: Not on file    Non-medical: Not on file  Tobacco Use  . Smoking status: Never Smoker  . Smokeless tobacco: Never Used  Substance and Sexual Activity  . Alcohol use: Never    Frequency: Never  . Drug use: Never  . Sexual activity: Not on file  Lifestyle  . Physical activity    Days per week: Not on file    Minutes per session: Not on file  . Stress: Not on file  Relationships  . Social connections    Talks on phone: Not on file    Gets together: Not on file    Attends religious service: Not on file    Active member of club or organization: Not on file    Attends meetings of clubs or organizations: Not on file    Relationship status: Not on file  . Intimate partner violence    Fear of current or ex partner: Not on file    Emotionally abused: Not on file    Physically abused: Not on file    Forced sexual activity: Not on file  Other Topics Concern  . Not on file  Social History Narrative  . Not on file    Review of  Systems: All systems reviewed and negative except where noted in HPI.  Physical Exam: Vital signs in last 24 hours: Temp:  [99.3 F (37.4 C)] 99.3 F (37.4 C) (10/14 1110) Pulse Rate:  [104-124] 104 (10/14 1536) Resp:  [16-22] 22 (10/14 1536) BP: (123-149)/(56-93) 123/93 (10/14 1536) SpO2:  [98 %-100 %] 98 % (10/14 1536)   General:   Alert, obese male in NAD Psych:  cooperative. Follows most commands but seems agitated Eyes:  Pupils equal, sclera clear, no icterus.   Conjunctiva pink. Ears:  Normal auditory acuity. Nose:  No deformity, discharge,  or lesions. Neck:  Supple; no masses Lungs:  Clear throughout to auscultation.   No wheezes, crackles, or rhonchi.  Heart:  Regular rate and rhythm; no murmurs, no lower extremity edema Abdomen:  Soft, obese, non-distended, appears nontender, BS active, no palp mass   Rectal:  Deferred  Msk:  Symmetrical without gross deformities. . Neurologic:  Alert and  oriented x4;  grossly normal neurologically. Skin:  Intact without significant lesions or rashes.   Intake/Output from previous day: No intake/output data recorded. Intake/Output this shift: Total I/O In: 1000 [IV Piggyback:1000] Out: -   Lab Results: Recent Labs    11/07/18 1253  WBC 7.5  HGB 14.4  HCT 45.1  PLT 484*   BMET Recent Labs    11/07/18 1253  NA 137  K 3.9  CL 99  CO2 26  GLUCOSE 107*  BUN 8  CREATININE 0.81  CALCIUM 9.5   LFT Recent Labs    11/07/18 1253  PROT 7.6  ALBUMIN 4.5    AST 65*  ALT 98*  ALKPHOS 79  BILITOT 1.3*   PT/INR No results for input(s): LABPROT, INR in the last 72 hours. Hepatitis Panel No results for input(s): HEPBSAG, HCVAB, HEPAIGM, HEPBIGM in the last 72 hours.   . CBC Latest Ref Rng & Units 11/07/2018 11/24/2015 05/03/2014  WBC 4.0 - 10.5 K/uL 7.5 9.3 15.5(H)  Hemoglobin 13.0 - 17.0 g/dL 64.6 12.8(L) 12.5  Hematocrit 39.0 - 52.0 % 45.1 39.2 39.0  Platelets 150 - 400 K/uL 484(H) 348 430(H)    . CMP Latest  Ref Rng & Units 11/07/2018 11/24/2015 05/03/2014  Glucose 70 - 99 mg/dL 803(O) 122(Q) 825(O)  BUN 6 - 20 mg/dL 8 9 11   Creatinine 0.61 - 1.24 mg/dL 0.37) 0.48(G  Sodium 135 - 145 mmol/L 137 137 138  Potassium 3.5 - 5.1 mmol/L 3.9 3.7 3.7  Chloride 98 - 111 mmol/L 99 106 107  CO2 22 - 32 mmol/L 26 25 18(L)  Calcium 8.9 - 10.3 mg/dL 9.5 9.5 9.6  Total Protein 6.5 - 8.1 g/dL 7.6 - 6.7  Total Bilirubin 0.3 - 1.2 mg/dL 8.91) - 0.3  Alkaline Phos 38 - 126 U/L 79 - 108  AST 15 - 41 U/L 65(H) - 31  ALT 0 - 44 U/L 98(H) - 47   Studies/Results: No results found.  Active Problems:   Autistic disorder   Intractable nausea and vomiting   Obesity (BMI 30-39.9)   Seizure disorder (HCC)    6.9(I, NP-C @  11/07/2018, 4:58 PM

## 2018-11-07 NOTE — ED Notes (Signed)
Per mother's request, blood draw with IV start to prevent multiple sticks.

## 2018-11-07 NOTE — Telephone Encounter (Signed)
Thank you so much

## 2018-11-07 NOTE — H&P (Signed)
TRH H&P   Patient Demographics:    Aaron Vega, is a 23 y.o. male  MRN: [id number]   DOB - 02/14/1995  Admit Date - 11/07/2018  Outpatient Primary MD for the patient is [facility]  Referring MD: Dr. Criss AlvineGoldston  Outpatient Specialists: None  Patient coming from: Home  Chief Complaint  Patient presents with  . Emesis  . Abdominal Pain      HPI:    Aaron Vega  is a 23 y.o. male, with severe autism, nonverbal, history of seizure disorder, obesity who was brought to the ED by his mother for intractable nausea and vomiting for past several weeks.  History provided by the mother as patient unable to communicate.  As per her patient had a seizure episode and fell at home about 2 months back.  Following this episode he started having infrequent episodes of gagging and vomiting of food particles.  Symptoms started to get worse and patient for the past 2-3 weeks had persistent vomiting, unable to keep any food or medications down..  Also noticed patient to be constipated with only 1-2 bowel movements in past 2 weeks.  Reports low-grade fever of 99 F.  On some occasions she has noticed patient to be retching, putting his fingers down his throat and trying to vomit when she has noticed streaks of blood mixed with the liquid that he vomits.  She denies similar symptoms prior to 2 months ago.  No recent illness, sick contact or travel.  No recent change in his medications.  She denies patient complaining of headache, dizziness, chest discomfort, shortness of breath, dysuria or diarrhea.  Patient does hold his abdomen frequently and does not let her press on it. As per ED physician he was [facility] on 10/5 in 10/10 and had abdominal CT done.  As per the mother she was told he had an abdominal hernia and needed to see GI Dr. Chales AbrahamsGupta as outpatient.  However  per ED physician on reviewing the CT report it did not comment on any acute abdominal findings, obvious hernia or obstruction. Upon discussion with GI, patient has an outpatient appointment scheduled in 2 days for evaluation. In the ED, patient had temperature of 9 9.3 F, tachycardic to 120s, respiratory rate of 22, blood pressure elevated at 149/83 mmHg.  Blood work showed normal CBC, hemoglobin of 14.4, normal platelets and electrolytes.  Mild transaminitis with AST of 65 and ALT of 98.  Total bili of 1.3.  Lipase was normal. Patient given 2 L IV normal saline bolus after which she is heart rate started to improve slowly and also given IV Zofran.  Hospitalist consulted for observation to medical floor.  GI consulted as well.    Review of systems:    In addition to the HPI above, Patient noncommunicative at baseline due to severe autism and unable to  provide any history.   With Past History of the following :    Past Medical History:  Diagnosis Date  . Autistic disorder, current or active state   . Fatty liver   . Hypertension   . Obesity   . Pancreatitis    at age 24 years  . Pre-diabetes   . Seizures (HCC)    as of 11/23/15 - no seizures for 3 month  . Sleep apnea    not on cpap (can't tolerate)  . Tics of organic origin   . Tourette syndrome       Past Surgical History:  Procedure Laterality Date  . RADIOLOGY WITH ANESTHESIA N/A 11/24/2015   Procedure: CT SCAN ABDOMEN AND PELVIS WITH CONTRAST;  Surgeon: Medication Radiologist, MD;  Location: [facility] OR;  Service: Radiology;  Laterality: N/A;  . TONSILLECTOMY     and adenoidectomy      Social History:     Social History   Tobacco Use  . Smoking status: Never Smoker  . Smokeless tobacco: Never Used  Substance Use Topics  . Alcohol use: Never    Frequency: Never     Lives -home with mother  Mobility -dependent with some unsteady gait per mother     Family History :     Family History  Problem Relation Age  of Onset  . Seizures Maternal Aunt   . Anxiety disorder Paternal Uncle   . Depression Paternal Uncle   . Bipolar disorder Paternal Uncle   . Seizures Cousin        Maternal 1st and 2nd cousins have seizures  . Anxiety disorder Other        Paternal fhx  . Depression Other        Paternal fhx  . Hypertension Other        Paternal fhx  . Bipolar disorder Other        Paternal fhx      Home Medications:   Prior to Admission medications   Medication Sig Start Date End Date Taking? Authorizing Provider  citalopram (CELEXA) 40 MG tablet Take 40 mg by mouth daily. 04/24/14  Yes [provider]  cloBAZam (ONFI) 20 MG tablet Take 60 mg by mouth 2 (two) times daily. 10/16/18  Yes [provider]  diazepam (DIASAT) 20 MG GEL Place 20 mg rectally daily as needed (for seizures lasting more than 5 minutes.).  07/03/13  Yes [provider]  diazepam (VALIUM) 5 MG tablet Take 7.5 mg by mouth every morning. May give 0.5 tablet (2.5mg ) as needed for agitation/anxiety 10/15/18  Yes [provider]  lisinopril (PRINIVIL,ZESTRIL) 20 MG tablet Take 20 mg by mouth daily. Take with 5mg  tablet for a total dose of 25mg  04/15/14  Yes [provider]  lisinopril (ZESTRIL) 5 MG tablet Take 5 mg by mouth daily. Take with 20mg  tablet for a total dose of 25mg  10/14/18  Yes [provider]  Melatonin 5 MG TABS Take 5 mg by mouth at bedtime.   Yes [provider]  Midazolam (NAYZILAM) 5 MG/0.1ML SOLN Place 5 mg into the nose daily as needed (seizure).   Yes [provider]  nystatin (MYCOSTATIN/NYSTOP) 100000 UNIT/GM POWD Apply 1 application topically daily. Neck, crevices, etc. 04/23/14  Yes [provider]  VIMPAT 10 MG/ML oral solution Take 10 mLs by mouth 2 (two) times daily. 10/28/18  Yes [provider]     Allergies:    No Known Allergies   Physical Exam:  Vitals  Blood pressure (!) 123/93, pulse (!) 104, temperature  99.3 F (37.4 C), temperature source Axillary, resp. rate (!) 22, SpO2 98 %.   Young obese male lying in bed in no acute distress, poor eye contact HEENT: No pallor, no icterus, moist mucosa, unable to examine for oral thrush (patient poorly cooperative), no cervical lymphadenopathy, supple neck Chest: Clear to auscultation bilaterally CVS: S1 and S2 tachycardic, no murmurs rub or gallop GI: Soft, nondistended, bowel sounds present, patient withdraws with discomfort on abdominal palpation Musculoskeletal: Warm, no edema CNS: Alert and awake, nonverbal and poorly communicative   Data Review:    CBC Recent Labs  Lab 11/07/18 1253  WBC 7.5  HGB 14.4  HCT 45.1  PLT 484*  MCV 86.7  MCH 27.7  MCHC 31.9  RDW 12.3  LYMPHSABS 3.1  MONOABS 0.7  EOSABS 0.1  BASOSABS 0.0   ------------------------------------------------------------------------------------------------------------------  Chemistries  Recent Labs  Lab 11/07/18 1253  NA 137  K 3.9  CL 99  CO2 26  GLUCOSE 107*  BUN 8  CREATININE 0.81  CALCIUM 9.5  AST 65*  ALT 98*  ALKPHOS 79  BILITOT 1.3*   ------------------------------------------------------------------------------------------------------------------ CrCl cannot be calculated (Unknown ideal weight.). ------------------------------------------------------------------------------------------------------------------ No results for input(s): TSH, T4TOTAL, T3FREE, THYROIDAB in the last 72 hours.  Invalid input(s): FREET3  Coagulation profile No results for input(s): INR, PROTIME in the last 168 hours. ------------------------------------------------------------------------------------------------------------------- No results for input(s): DDIMER in the last 72 hours. -------------------------------------------------------------------------------------------------------------------  Cardiac Enzymes No results for input(s): CKMB, TROPONINI, MYOGLOBIN in  the last 168 hours.  Invalid input(s): CK ------------------------------------------------------------------------------------------------------------------ No results found for: BNP   ---------------------------------------------------------------------------------------------------------------  Urinalysis    Component Value Date/Time   COLORURINE AMBER (A) 11/07/2018 1530   APPEARANCEUR CLOUDY (A) 11/07/2018 1530   LABSPEC 1.027 11/07/2018 1530   PHURINE 5.0 11/07/2018 1530   GLUCOSEU NEGATIVE 11/07/2018 [place] 11/07/2018 1530   BILIRUBINUR SMALL (A) 11/07/2018 [place] 11/07/2018 1530   PROTEINUR 30 (A) 11/07/2018 1530   NITRITE NEGATIVE 11/07/2018 1530   LEUKOCYTESUR NEGATIVE 11/07/2018 1530    ----------------------------------------------------------------------------------------------------------------   Imaging Results:    No results found.  My personal review of EKG: Pending   Assessment & Plan:   Principal problem   Intractable nausea and vomiting Differential includes esophagitis, PUD, oral/esophageal candidiasis.  As per ED note recent CT was negative for any hernia or abdominal obstruction. Keep n.p.o.  IV hydration with normal saline at 125 cc/h.  Monitor electrolytes.  IV PPI daily and PRN IV Zofran.  Check EKG. Further recommendations per GI.     Seizure disorder ([place])  resume Vimpat, switch to IV.  Resume clobazam tablet if tolerated.  PRN diazepam (rectally) for seizures (patient is on this at home).  Will order PRN IV Ativan for acute seizures.  Transaminitis Mild.  Monitor labs closely in a.m.  Dehydration Secondary to nausea and vomiting and poor p.o. intake.  Monitor with hydration  Essential hypertension Hold home blood pressure medication for now.  PRN IV hydralazine  Autistic disorder     Obesity (BMI 30-39.9)    DVT Prophylaxis subcu Lovenox  AM Labs Ordered, also please review Full Orders   Family Communication: Admission, patients condition and plan of care including tests being ordered have been discussed with the patient's mother at bedside Code Status full code  Likely DC to home in the next 24 hours once evaluated by GI and work-up completed  Condition: [place]  called: [facility]  Admission status: Observation  Time spent in minutes : 50   Jermayne Sweeney M.D on 11/07/2018 at 4:04 PM  Between 7am to 7pm - Pager - [phone]. After 7pm go to [email or web address] - password [facility]  [facility] - Office  [phone]

## 2018-11-07 NOTE — ED Triage Notes (Signed)
Patient reports to the ER with his mother. Patient has reportedly had an unsteady balance, has not had a BM in over a week, and has been having nausea and emesis. Patient reportedly has lost 28 lbs in the last few weeks.  Patient's mother called GI and they told her to come to the ED for evaluation.

## 2018-11-08 ENCOUNTER — Observation Stay (HOSPITAL_COMMUNITY): Payer: Medicaid Other | Admitting: Certified Registered Nurse Anesthetist

## 2018-11-08 ENCOUNTER — Encounter (HOSPITAL_COMMUNITY): Payer: Self-pay | Admitting: *Deleted

## 2018-11-08 ENCOUNTER — Encounter (HOSPITAL_COMMUNITY)
Admission: EM | Disposition: A | Discharge status: Home / Self Care | Payer: Self-pay | Source: Home / Self Care | Attending: Emergency Medicine

## 2018-11-08 DIAGNOSIS — K5900 Constipation, unspecified: Secondary | ICD-10-CM | POA: Diagnosis not present

## 2018-11-08 DIAGNOSIS — K2100 Gastro-esophageal reflux disease with esophagitis, without bleeding: Secondary | ICD-10-CM

## 2018-11-08 DIAGNOSIS — R7401 Elevation of levels of liver transaminase levels: Secondary | ICD-10-CM

## 2018-11-08 DIAGNOSIS — K228 Other specified diseases of esophagus: Secondary | ICD-10-CM

## 2018-11-08 DIAGNOSIS — R112 Nausea with vomiting, unspecified: Secondary | ICD-10-CM | POA: Diagnosis not present

## 2018-11-08 DIAGNOSIS — E860 Dehydration: Secondary | ICD-10-CM | POA: Diagnosis not present

## 2018-11-08 DIAGNOSIS — R1110 Vomiting, unspecified: Secondary | ICD-10-CM | POA: Diagnosis present

## 2018-11-08 DIAGNOSIS — F840 Autistic disorder: Secondary | ICD-10-CM | POA: Diagnosis not present

## 2018-11-08 DIAGNOSIS — R1084 Generalized abdominal pain: Secondary | ICD-10-CM | POA: Diagnosis not present

## 2018-11-08 HISTORY — PX: BIOPSY: SHX5522

## 2018-11-08 HISTORY — PX: ESOPHAGOGASTRODUODENOSCOPY (EGD) WITH PROPOFOL: SHX5813

## 2018-11-08 LAB — HEPATIC FUNCTION PANEL
ALT: 93 U/L — ABNORMAL HIGH (ref 0–44)
AST: 69 U/L — ABNORMAL HIGH (ref 15–41)
Albumin: 3.9 g/dL (ref 3.5–5.0)
Alkaline Phosphatase: 66 U/L (ref 38–126)
Bilirubin, Direct: 0.4 mg/dL — ABNORMAL HIGH (ref 0.0–0.2)
Indirect Bilirubin: 0.8 mg/dL (ref 0.3–0.9)
Total Bilirubin: 1.2 mg/dL (ref 0.3–1.2)
Total Protein: 6.5 g/dL (ref 6.5–8.1)

## 2018-11-08 LAB — BASIC METABOLIC PANEL
Anion gap: 12 (ref 5–15)
BUN: 8 mg/dL (ref 6–20)
CO2: 23 mmol/L (ref 22–32)
Calcium: 9.1 mg/dL (ref 8.9–10.3)
Chloride: 101 mmol/L (ref 98–111)
Creatinine, Ser: 0.69 mg/dL (ref 0.61–1.24)
GFR calc Af Amer: >60 mL/min (ref >60)
GFR calc non Af Amer: >60 mL/min (ref >60)
Glucose, Bld: 105 mg/dL — ABNORMAL HIGH (ref 70–99)
Potassium: 4.1 mmol/L (ref 3.5–5.1)
Sodium: 136 mmol/L (ref 135–145)

## 2018-11-08 LAB — URINE CULTURE

## 2018-11-08 SURGERY — ESOPHAGOGASTRODUODENOSCOPY (EGD) WITH PROPOFOL
Anesthesia: Monitor Anesthesia Care

## 2018-11-08 MED ORDER — PROPOFOL 10 MG/ML IV BOLUS
INTRAVENOUS | Status: AC
  Filled 2018-11-08: qty 20

## 2018-11-08 MED ORDER — FLEET ENEMA 7-19 GM/118ML RE ENEM: 1.0000 | ENEMA | Freq: Two times a day (BID) | RECTAL | Status: DC

## 2018-11-08 MED ORDER — MIDAZOLAM HCL 2 MG/2ML IJ SOLN
INTRAMUSCULAR | Status: DC | PRN
  Administered 2018-11-08 (×2): 1 mg via INTRAVENOUS

## 2018-11-08 MED ORDER — POLYETHYLENE GLYCOL 3350 17 GM/SCOOP PO POWD
1.0000 | Freq: Once | ORAL | Status: DC
  Filled 2018-11-08: qty 255

## 2018-11-08 MED ORDER — PROPOFOL 500 MG/50ML IV EMUL
INTRAVENOUS | Status: DC | PRN
  Administered 2018-11-08: 50 ug/kg/min via INTRAVENOUS

## 2018-11-08 MED ORDER — PROPOFOL 10 MG/ML IV BOLUS
INTRAVENOUS | Status: DC | PRN
  Administered 2018-11-08 (×8): 30 mg via INTRAVENOUS

## 2018-11-08 MED ORDER — POLYETHYLENE GLYCOL 3350 17 G PO PACK: 17.0000 g | PACK | Freq: Every day | ORAL | 0 refills | Status: DC

## 2018-11-08 MED ORDER — PROPOFOL 500 MG/50ML IV EMUL
INTRAVENOUS | Status: AC
  Filled 2018-11-08: qty 50

## 2018-11-08 MED ORDER — POLYETHYLENE GLYCOL 3350 17 GM/SCOOP PO POWD: 1.0000 | Freq: Once | ORAL | 0 refills | Status: AC

## 2018-11-08 MED ORDER — ONDANSETRON HCL 4 MG/2ML IJ SOLN: 4.0000 mg | Freq: Three times a day (TID) | INTRAMUSCULAR | Status: DC

## 2018-11-08 MED ORDER — ONDANSETRON HCL 4 MG PO TABS: 4.0000 mg | ORAL_TABLET | Freq: Three times a day (TID) | ORAL | 0 refills | Status: DC | PRN

## 2018-11-08 MED ORDER — LACTATED RINGERS IV SOLN
INTRAVENOUS | Status: DC | PRN
  Administered 2018-11-08: 13:00:00 via INTRAVENOUS

## 2018-11-08 MED ORDER — OMEPRAZOLE 2 MG/ML ORAL SUSPENSION: 40.0000 mg | Freq: Every day | ORAL | 0 refills | Status: DC

## 2018-11-08 MED ORDER — MIDAZOLAM HCL 2 MG/2ML IJ SOLN
INTRAMUSCULAR | Status: AC
  Filled 2018-11-08: qty 2

## 2018-11-08 MED ORDER — OMEPRAZOLE 2 MG/ML ORAL SUSPENSION
40.0000 mg | Freq: Every day | ORAL | Status: DC
  Filled 2018-11-08 (×2): qty 20

## 2018-11-08 SURGICAL SUPPLY — 15 items

## 2018-11-08 NOTE — Discharge Summary (Signed)
Physician Discharge Summary  Aaron Vega [name] DOB: 08-25-1995 DOA: 11/07/2018  [name]  Admit date: 11/07/2018 Discharge date: 11/08/2018  Admitted From: Home Disposition: Home  Recommendations for Outpatient Follow-up:  1. Follow-up with PCP in 1 week 2. GI will call if outpatient appointment needed and with results of the biopsy from EGD  Home Health: None Equipment/Devices: None  Discharge Condition: Fair CODE STATUS: Full code Diet recommendation: Regular    Discharge Diagnoses:  Principal Problem:   Reflux esophagitis   Active Problems:   Autistic disorder   Intractable nausea and vomiting   Obesity (BMI 30-39.9)   Seizure disorder (HCC)   Vomiting in adult   Constipation in male  Brief narrative/HPI 23 y.o. male, with severe autism, nonverbal, history of seizure disorder, obesity who was brought to the ED by his mother for intractable nausea and vomiting for past several weeks.  History provided by mother at bedside.  Symptoms ongoing for past 2 months with progressive vomiting, unable to keep food down for past 2 weeks associated with constipation.  Also had low-grade fever.  Episodes of induced vomiting.  No recent illness, sick contact or travel.  No change in his medication.  Patient was seen in the [facility] ED on 10/5 and 10/10 where abdominal CT was done which was negative for any hernia, obstruction or intra-abdominal abnormality. Patient was discharged from there with outpatient appointment arranged with [facility] which was scheduled for 10/16.  However since patient's symptoms persisted he was brought to the ED. In the ED he had temperature of 99.3 F, tachycardic to 120s, elevated blood pressure.  Blood work showed normal CBC with mild transaminitis (AST 65 and ALT 98), normal bilirubin and lipase. Given IV normal saline bolus, IV Zofran and hospitalist called for observation to medical floor.  GI  consulted.  Hospital course Intractable nausea and vomiting Patient given IV hydration, IV PPI and antiemetics as needed.  No further episodes of nausea or vomiting while in the hospital. EGD done today shows findings of  esophagitis (LA grade B) without any bleeding.  Suspect either due to acid reflux versus recent vomiting.  Nodularity found at the GE junction, biopsies taken (suspect inflammatory), sent for histology and H. pylori.  Stomach was otherwise normal.  Duodenum normal. Suspect this could be contributed by his constipation as well.  Order for MiraLAX enema but since patient was getting irritable and family wanted to take him home prescriptions given for polyethylene glycol powder X1  and daily MiraLAX. Also prescribed liquid PPI and PRN Zofran.  Transaminitis Mild.  Possible for fatty liver.  Follow as outpatient  Rest of the medical problems are stable.  Resume all home meds.  Stable for discharge.  Consults: GI Procedures: EGD Family communication: Mother at bedside Disposition: Home   Hospital course  Discharge Instructions   Allergies as of 11/08/2018   No Known Allergies     Medication List    TAKE these medications   citalopram 40 MG tablet Commonly known as: CELEXA Take 40 mg by mouth daily.   cloBAZam 20 MG tablet Commonly known as: ONFI Take 60 mg by mouth 2 (two) times daily.   diazepam 20 MG Gel Commonly known as: DIASAT Place 20 mg rectally daily as needed (for seizures lasting more than 5 minutes.).   diazepam 5 MG tablet Commonly known as: VALIUM Take 7.5 mg by mouth every morning. May give 0.5 tablet (2.5mg ) as needed for agitation/anxiety   lisinopril 20  MG tablet Commonly known as: ZESTRIL Take 20 mg by mouth daily. Take with 5mg  tablet for a total dose of 25mg    lisinopril 5 MG tablet Commonly known as: ZESTRIL Take 5 mg by mouth daily. Take with 20mg  tablet for a total dose of 25mg    Melatonin 5 MG Tabs Take 5 mg by mouth at  bedtime.   Nayzilam 5 MG/0.1ML Soln Generic drug: Midazolam Place 5 mg into the nose daily as needed (seizure).   nystatin powder Generic drug: nystatin Apply 1 application topically daily. Neck, crevices, etc.   omeprazole 2 mg/mL Susp oral suspension Commonly known as: FIRST-Omeprazole Take 20 mLs (40 mg total) by mouth daily before breakfast.   ondansetron 4 MG tablet Commonly known as: Zofran Take 1 tablet (4 mg total) by mouth every 8 (eight) hours as needed for nausea or vomiting.   polyethylene glycol 17 g packet Commonly known as: MiraLax Take 17 g by mouth daily.   polyethylene glycol powder 17 GM/SCOOP powder Commonly known as: GLYCOLAX/MIRALAX Take 255 g by mouth once for 1 dose.   Vimpat 10 MG/ML oral solution Generic drug: lacosamide Take 10 mLs by mouth 2 (two) times daily.      Follow-up Information    [facility]. Schedule an appointment as soon as possible for a visit in 1 week(s).   Specialty: Pediatrics Contact information: [place] [address] [phone]        Jerene Bears, MD Follow up.   Specialty: Gastroenterology Why: office will call with appt as necessary and following bx results Contact information: [address] [place] [address] [phone]          No Known Allergies    Subjective: No acute distress  Discharge Exam: Vitals:   11/08/18 1400 11/08/18 1410  BP: 140/81 (!) 150/95  Pulse: (!) 103 100  Resp: 18 16  Temp:    SpO2: 99% 100%   Vitals:   11/08/18 1206 11/08/18 1356 11/08/18 1400 11/08/18 1410  BP: (!) 164/93 139/72 140/81 (!) 150/95  Pulse: (!) 113 100 (!) 103 100  Resp: 20 (!) 23 18 16   Temp: 99.3 F (37.4 C) 99.5 F (37.5 C)    TempSrc: Oral Temporal    SpO2: 97% 99% 99% 100%  Weight: (!) 148.8 kg     Height: 5\' 9"  (1.753 m)       General: Young obese autistic male not in acute distress HEENT: Moist mucosa, supple  neck Chest: Clear bilaterally CVs: Normal S1-S2 GI: Soft, nondistended, nontender Musculoskeletal: Warm, no edema     The results of significant diagnostics from this hospitalization (including imaging, microbiology, ancillary and laboratory) are listed below for reference.     Microbiology: Recent Results (from the past 240 hour(s))  SARS CORONAVIRUS 2 (TAT 6-24 HRS) Nasopharyngeal Nasopharyngeal Swab     Status: None   Collection Time: 11/07/18  3:00 PM   Specimen: Nasopharyngeal Swab  Result Value Ref Range Status   SARS Coronavirus 2 NEGATIVE NEGATIVE Final    Comment: (NOTE) SARS-CoV-2 target nucleic acids are NOT DETECTED. The SARS-CoV-2 RNA is generally detectable in upper and lower respiratory specimens during the acute phase of infection. Negative results do not preclude SARS-CoV-2 infection, do not rule out co-infections with other pathogens, and should not be used as the sole basis for treatment or other patient management decisions. Negative results must be combined with clinical observations, patient history, and epidemiological information. The expected result is Negative.  Fact Sheet for Patients: HairSlick.nohttps://www.fda.gov/media/138098/download Fact Sheet for Healthcare Providers: quierodirigir.comhttps://www.fda.gov/media/138095/download This test is not yet approved or cleared by the [place] FDA and  has been authorized for detection and/or diagnosis of SARS-CoV-2 by FDA under an Emergency Use Authorization (EUA). This EUA will remain  in effect (meaning this test can be used) for the duration of the COVID-19 declaration under Section 56 4(b)(1) of the Act, 21 U.S.C. section 360bbb-3(b)(1), unless the authorization is terminated or revoked sooner. Performed at [facility], [address] [place], [place] [address]      Labs: BNP (last 3 results) No results for input(s): BNP in the last 8760 hours. Basic Metabolic Panel: Recent Labs  Lab 11/07/18 1253  11/08/18 0605  NA 137 136  K 3.9 4.1  CL 99 101  CO2 26 23  GLUCOSE 107* 105*  BUN 8 8  CREATININE 0.81 0.69  CALCIUM 9.5 9.1   Liver Function Tests: Recent Labs  Lab 11/07/18 1253 11/08/18 0605  AST 65* 69*  ALT 98* 93*  ALKPHOS 79 66  BILITOT 1.3* 1.2  PROT 7.6 6.5  ALBUMIN 4.5 3.9   Recent Labs  Lab 11/07/18 1253  LIPASE 21   No results for input(s): AMMONIA in the last 168 hours. CBC: Recent Labs  Lab 11/07/18 1253  WBC 7.5  NEUTROABS 3.5  HGB 14.4  HCT 45.1  MCV 86.7  PLT 484*   Cardiac Enzymes: No results for input(s): CKTOTAL, CKMB, CKMBINDEX, TROPONINI in the last 168 hours. BNP: Invalid input(s): POCBNP CBG: No results for input(s): GLUCAP in the last 168 hours. D-Dimer No results for input(s): DDIMER in the last 72 hours. Hgb A1c No results for input(s): HGBA1C in the last 72 hours. Lipid Profile No results for input(s): CHOL, HDL, LDLCALC, TRIG, CHOLHDL, LDLDIRECT in the last 72 hours. Thyroid function studies No results for input(s): TSH, T4TOTAL, T3FREE, THYROIDAB in the last 72 hours.  Invalid input(s): FREET3 Anemia work up No results for input(s): VITAMINB12, FOLATE, FERRITIN, TIBC, IRON, RETICCTPCT in the last 72 hours. Urinalysis    Component Value Date/Time   COLORURINE AMBER (A) 11/07/2018 1530   APPEARANCEUR CLOUDY (A) 11/07/2018 1530   LABSPEC 1.027 11/07/2018 1530   PHURINE 5.0 11/07/2018 1530   GLUCOSEU NEGATIVE 11/07/2018 1530   HGBUR NEGATIVE 11/07/2018 1530   BILIRUBINUR SMALL (A) 11/07/2018 1530   KETONESUR NEGATIVE 11/07/2018 1530   PROTEINUR 30 (A) 11/07/2018 1530   NITRITE NEGATIVE 11/07/2018 1530   LEUKOCYTESUR NEGATIVE 11/07/2018 1530   Sepsis Labs Invalid input(s): PROCALCITONIN,  WBC,  LACTICIDVEN Microbiology Recent Results (from the past 240 hour(s))  SARS CORONAVIRUS 2 (TAT 6-24 HRS) Nasopharyngeal Nasopharyngeal Swab     Status: None   Collection Time: 11/07/18  3:00 PM   Specimen: Nasopharyngeal  Swab  Result Value Ref Range Status   SARS Coronavirus 2 NEGATIVE NEGATIVE Final    Comment: (NOTE) SARS-CoV-2 target nucleic acids are NOT DETECTED. The SARS-CoV-2 RNA is generally detectable in upper and lower respiratory specimens during the acute phase of infection. Negative results do not preclude SARS-CoV-2 infection, do not rule out co-infections with other pathogens, and should not be used as the sole basis for treatment or other patient management decisions. Negative results must be combined with clinical observations, patient history, and epidemiological information. The expected result is Negative. Fact Sheet for Patients: HairSlick.nohttps://www.fda.gov/media/138098/download Fact Sheet for Healthcare Providers: quierodirigir.comhttps://www.fda.gov/media/138095/download This test is not yet approved or cleared by the [place] FDA and  has  been authorized for detection and/or diagnosis of SARS-CoV-2 by FDA under an Emergency Use Authorization (EUA). This EUA will remain  in effect (meaning this test can be used) for the duration of the COVID-19 declaration under Section 56 4(b)(1) of the Act, 21 U.S.C. section 360bbb-3(b)(1), unless the authorization is terminated or revoked sooner. Performed at [facility], [address] [place], [place] [address]      Time coordinating discharge: < 30 minutes  SIGNED:   Eddie North, MD  [facility] 11/08/2018, 5:09 PM Pager   If 7PM-7AM, please contact night-coverage [email or web address] Password [email or web address]

## 2018-11-08 NOTE — Interval H&P Note (Signed)
History and Physical Interval Note: For EGD today to eval N/V, poor PO intake. The nature of the procedure, as well as the risks, benefits, and alternatives were carefully and thoroughly reviewed with the patient's mother. Ample time for discussion and questions allowed. The patient's mother understood, was satisfied, and agreed to proceed.     11/08/2018 12:35 PM  Christella Scheuermann  has presented today for surgery, with the diagnosis of nausea, vomiting, abdominal pain.  The various methods of treatment have been discussed with the patient and family. After consideration of risks, benefits and other options for treatment, the patient has consented to  Procedure(s): ESOPHAGOGASTRODUODENOSCOPY (EGD) WITH PROPOFOL (N/A) as a surgical intervention.  The patient's history has been reviewed, patient examined, no change in status, stable for surgery.  I have reviewed the patient's chart and labs.  Questions were answered to the patient's satisfaction.     Lajuan Lines Terrelle Ruffolo

## 2018-11-08 NOTE — Anesthesia Procedure Notes (Signed)
Procedure Name: MAC Date/Time: 11/08/2018 1:04 PM Performed by: Deliah Boston, CRNA Pre-anesthesia Checklist: Patient identified, Emergency Drugs available, Suction available and Patient being monitored Patient Re-evaluated:Patient Re-evaluated prior to induction Preoxygenation: Pre-oxygenation with 100% oxygen Placement Confirmation: positive ETCO2 and breath sounds checked- equal and bilateral Dental Injury: Teeth and Oropharynx as per pre-operative assessment

## 2018-11-08 NOTE — Anesthesia Preprocedure Evaluation (Addendum)
Anesthesia Evaluation  Patient identified by MRN, date of birth, ID band Patient unresponsive    History of Anesthesia Complications Negative for: history of anesthetic complications  Airway Mallampati: IV  TM Distance: >3 FB Neck ROM: Full    Dental  (+) Dental Advisory Given   Pulmonary sleep apnea ,    breath sounds clear to auscultation       Cardiovascular hypertension,  Rhythm:Regular     Neuro/Psych Seizures -, Well Controlled,  PSYCHIATRIC DISORDERS    GI/Hepatic negative GI ROS, Neg liver ROS,   Endo/Other  negative endocrine ROS  Renal/GU negative Renal ROS     Musculoskeletal negative musculoskeletal ROS (+)   Abdominal   Peds  Hematology negative hematology ROS (+)   Anesthesia Other Findings   Reproductive/Obstetrics                           Anesthesia Physical Anesthesia Plan  ASA: III  Anesthesia Plan: MAC   Post-op Pain Management:    Induction: Intravenous  PONV Risk Score and Plan: 1 and Treatment may vary due to age or medical condition and Propofol infusion  Airway Management Planned: Nasal Cannula  Additional Equipment: None  Intra-op Plan:   Post-operative Plan:   Informed Consent: I have reviewed the patients History and Physical, chart, labs and discussed the procedure including the risks, benefits and alternatives for the proposed anesthesia with the patient or authorized representative who has indicated his/her understanding and acceptance.     Dental advisory given  Plan Discussed with: Surgeon and CRNA  Anesthesia Plan Comments:         Anesthesia Quick Evaluation

## 2018-11-08 NOTE — Transfer of Care (Signed)
Immediate Anesthesia Transfer of Care Note  Patient: Aaron Vega  Procedure(s) Performed: Procedure(s): ESOPHAGOGASTRODUODENOSCOPY (EGD) WITH PROPOFOL (N/A)  Patient Location: PACU  Anesthesia Type:MAC  Level of Consciousness: Patient easily awoken, sedated, comfortable, cooperative, following commands, responds to stimulation.   Airway & Oxygen Therapy: Patient spontaneously breathing, ventilating well, oxygen via simple oxygen mask.  Post-op Assessment: Report given to PACU RN, vital signs reviewed and stable, moving all extremities.   Post vital signs: Reviewed and stable.  Complications: No apparent anesthesia complications Last Vitals:  Vitals Value Taken Time  BP    Temp 37.5 C 11/08/18 1356  Pulse 100 11/08/18 1356  Resp 23 11/08/18 1356  SpO2 99 % 11/08/18 1356    Last Pain:  Vitals:   11/08/18 1356  TempSrc: Temporal  PainSc: 0-No pain         Complications: No apparent anesthesia complications

## 2018-11-08 NOTE — Progress Notes (Signed)
The pt's Grandparents came up to [place] to visit the pt after the pt was already taken down to Endo. The Grandparents were informed regarding the current visitor policy. The pt's Mother was already with the pt, which is the pt's one visitor. The Grandparents became verbally aggressive with staff and left the unit.   Upon the pts return to [place], the pt's Mother and Grandparents returned to the unit as well. The Grandparents were educated again regarding the visitor policy and asked to leave. The pt's Mother became verbally aggressive towards myself and indicated that they were all going to stay and there was nothing I could do about it. The Mother insisted the endo MD gave them verbal permission to have 3 visitors. Endo was called and denied giving them permission. Endo said they told the Grandparents they couldn't stay or come up to the floor d/t the current visitor policy.      Pt's Mother accused the staff of not being able to properly care for her son d/t his hx of autism. Pt's Mother refused any help from the staff and insisted we don't know what we're doing; only the Grandparents know how to help.  The Grandfather left the unit visibly upset and yelling as he walked through the hall. I was notified by endo shortly after the Grandfather left the floor that he went back down to endo and was verbally harassing the staff.    Prior to this event, the pt's Mother was given permission to stay with the pt 24/7 d/t the pt's cognitive function.     CN, Mudlogger, Surveyor, quantity, and [facility] aware of situation, which they all addressed.   Pt's Mother refused to keep the pt here another night unless the grandparents stay also.   Dhungel, Flonnie Overman, MD aware and discharging pt.

## 2018-11-08 NOTE — Progress Notes (Signed)
Pt has MEWS yellow, not an acute change; VS: BP 129/85, 98.7, HR 112, RR 18, 90% RA; pt is asleep no signs of distress noted; has standing order to page MD if HR greater than 120; will continue to monitor pt;

## 2018-11-08 NOTE — Op Note (Signed)
[facility] Patient Name: Aaron Vega Procedure Date: 11/08/2018 MRN: [id number] Attending MD: Beverley Fiedler , MD Date of Birth: 1996-01-22 CSN: [id number] Age: 23 Admit Type: Inpatient Procedure:                Upper GI endoscopy Indications:              Generalized abdominal pain, Nausea with vomiting Providers:                Carie Caddy. Rhea Belton, MD, Tillie Fantasia, RN, Beryle Beams, Technician, Randon Goldsmith, CRNA Referring MD:             [facility] Medicines:                Monitored Anesthesia Care Complications:            No immediate complications. Estimated Blood Loss:     Estimated blood loss was minimal. Procedure:                Pre-Anesthesia Assessment:                           - Prior to the procedure, a History and Physical                            was performed, and patient medications and                            allergies were reviewed. The patient's tolerance of                            previous anesthesia was also reviewed. The risks                            and benefits of the procedure and the sedation                            options and risks were discussed with the patient.                            All questions were answered, and informed consent                            was obtained. Prior Anticoagulants: The patient has                            taken no previous anticoagulant or antiplatelet                            agents. ASA Grade Assessment: III - A patient with                            severe systemic disease. After reviewing the risks  and benefits, the patient was deemed in                            satisfactory condition to undergo the procedure.                           After obtaining informed consent, the endoscope was                            passed under direct vision. Throughout the                            procedure, the patient's  blood pressure, pulse, and                            oxygen saturations were monitored continuously. The                            GIF-H190 ([id number]) Olympus gastroscope was                            introduced through the mouth, and advanced to the                            second part of duodenum. The upper GI endoscopy was                            accomplished without difficulty. The patient                            tolerated the procedure well. Scope In: Scope Out: Findings:      LA Grade B (one or more mucosal breaks greater than 5 mm, not extending       between the tops of two mucosal folds) esophagitis with no bleeding was       found 37 to 40 cm from the incisors. This is either due to acid reflux       or recent vomiting.      Mucosal changes characterized by nodularity were found at the       gastroesophageal junction. Likely inflammatory, but biopsies were taken       with a cold forceps for histology to exclude dysplasia.      The entire examined stomach was normal. Biopsies were taken with a cold       forceps for histology and Helicobacter pylori testing.      The examined duodenum was normal. Impression:               - LA Grade B reflux/vomiting related esophagitis.                           - Nodular mucosa in the esophagus. Biopsied.                           - Normal stomach. Biopsied.                           -  Normal examined duodenum.                           - No clear cause identified for nausea, vomiting.                            Constipation could be main culprit. Moderate Sedation:      N/A Recommendation:           - Return patient to hospital ward for ongoing care.                           - Advance diet as tolerated.                           - Continue present medications.                           - Begin daily (liquid given difficulty with taking                            pills) PPI.                           - Zofran PRN for  nausea/vomiting.                           - Await pathology results.                           - Bowel purge and then consideration of oral                            laxative such as Amitiza or Linzess. Procedure Code(s):        --- Professional ---                           [phone], Esophagogastroduodenoscopy, flexible,                            transoral; with biopsy, single or multiple Diagnosis Code(s):        --- Professional ---                           K21.0, Gastro-esophageal reflux disease with                            esophagitis                           K22.8, Other specified diseases of esophagus                           R10.84, Generalized abdominal pain                           R11.2, Nausea with vomiting, unspecified CPT copyright 2019 American Medical Association. All rights reserved. The codes documented  in this report are preliminary and upon coder review may  be revised to meet current compliance requirements. Beverley FiedlerJay M Pyrtle, MD 11/08/2018 1:47:55 PM This report has been signed electronically. Number of Addenda: 0

## 2018-11-08 NOTE — Progress Notes (Signed)
The pt's Mother (legal guardian) were provided with d/c instructions and prescriptions. After discussing the pt's plan of care upon d/c home, the pt's Mother reported no further questions or concerns.

## 2018-11-08 NOTE — Discharge Instructions (Signed)

## 2018-11-09 ENCOUNTER — Encounter: Payer: Self-pay | Admitting: Internal Medicine

## 2018-11-09 ENCOUNTER — Ambulatory Visit: Payer: Self-pay | Admitting: Physician Assistant

## 2018-11-09 ENCOUNTER — Encounter (HOSPITAL_COMMUNITY): Payer: Self-pay | Admitting: Internal Medicine

## 2018-11-09 LAB — SURGICAL PATHOLOGY

## 2018-11-09 NOTE — Telephone Encounter (Signed)
Spoke to Mom, will not be in today for appointment with Aaron Vega. Was in the hospital yesterday and was told to follow up in one week with Dr. Lyndel Safe. There are no appointments available in 1 week please advice what to do.

## 2018-11-09 NOTE — Telephone Encounter (Signed)
Called and spoke with patient's mother-[name]-DPR verified-[name] is requesting to make an appt with Dr. Lyndel Safe as follow up from the patient being admitted to the hospital and treated; patient has been scheduled with Dr. Lyndel Safe on 11/15/2018 at 4:00 pm; [name] advised to call back to the office at [phone] should questions/concerns arise; [name] verbalized understanding of information/instructions;   [place] from [place] office is inter office mailing the records for this patient for Dr. Lyndel Safe to review

## 2018-11-12 ENCOUNTER — Encounter (HOSPITAL_COMMUNITY): Payer: Self-pay | Admitting: Emergency Medicine

## 2018-11-12 ENCOUNTER — Emergency Department (HOSPITAL_COMMUNITY)
Admission: EM | Admit: 2018-11-12 | Discharge: 2018-11-12 | Disposition: A | Discharge status: Home / Self Care | Payer: Medicaid Other | Attending: Emergency Medicine | Admitting: Emergency Medicine

## 2018-11-12 ENCOUNTER — Other Ambulatory Visit: Payer: Self-pay

## 2018-11-12 DIAGNOSIS — K5900 Constipation, unspecified: Secondary | ICD-10-CM

## 2018-11-12 DIAGNOSIS — I10 Essential (primary) hypertension: Secondary | ICD-10-CM | POA: Insufficient documentation

## 2018-11-12 DIAGNOSIS — Z79899 Other long term (current) drug therapy: Secondary | ICD-10-CM | POA: Diagnosis not present

## 2018-11-12 MED ORDER — PEG-KCL-NACL-NASULF-NA ASC-C 100 G PO SOLR
1.0000 | Freq: Once | ORAL | Status: AC
  Administered 2018-11-12: 16:00:00 200 g via ORAL
  Filled 2018-11-12: qty 1

## 2018-11-12 MED ORDER — PROMETHAZINE HCL 25 MG RE SUPP: 25.0000 mg | Freq: Four times a day (QID) | RECTAL | 0 refills | Status: AC | PRN

## 2018-11-12 MED ORDER — ONDANSETRON 4 MG PO TBDP: 4.0000 mg | ORAL_TABLET | Freq: Three times a day (TID) | ORAL | 0 refills | Status: DC | PRN

## 2018-11-12 MED ORDER — LORAZEPAM 2 MG/ML IJ SOLN
2.0000 mg | Freq: Once | INTRAMUSCULAR | Status: AC
  Administered 2018-11-12: 2 mg via INTRAMUSCULAR
  Filled 2018-11-12: qty 1

## 2018-11-12 NOTE — Telephone Encounter (Signed)
Called and spoke with patient's mother-DPR verified-Aaron Vega reports she is wanting to take her son to the ER so they can manually disimpact him because he is trying to have a bowel movement-patient has taken stool softeners and laxatives all weekend long and has not had a bowel movement and he is putting his fist down his throat to get "things out of him because he can't poop"; RN advised Aaron Vega that would be advisable to take him to the ER since she feels like "someone needs to get the hard poop out of him before he makes himself throw up"- patient reported to his mother that he is not able to feel good because of this situation-Aaron Vega reports she will call back to the office should she need to update the MD on progress- Aaron Vega advised to call back to the office should questions/concerns arise; Aaron Vega verbalized understanding of information/instructions;

## 2018-11-12 NOTE — ED Notes (Signed)
Provider, 2 staff members, and pt mother unable to hold pt in side position for disimpaction attempt. Pt clenching his rectum and showing signs of agitation.

## 2018-11-12 NOTE — Discharge Instructions (Addendum)
You were seen in the ER for constipation  We were unable to perform rectal exam to assess for impaction  GI saw you in the ER and they recommended bowel prep to help induce bowel movement.

## 2018-11-12 NOTE — ED Triage Notes (Signed)
Patient BIB mother, reports patient has not had BM in 4 weeks. Admitted last week with endoscopy showing stool buildup.

## 2018-11-12 NOTE — Consult Note (Addendum)
Consultation  Referring Provider: Carmon Sails, PA-C     Primary Care Physician:  [facility] [place] Primary Gastroenterologist:   Dr. Lyndel Safe      Reason for Consultation: Constipation, ?Impaction?             HPI:   Aaron Vega is a 23 y.o. male with a past medical history of severe autism, mental retardation, seizure disorder and obesity, who is unable to verbally communicate, who presented to the ER today with his mother with complaints of constipation and possible impaction.    Recent admission 11/07/2018-11/08/2022 nausea and vomiting.  Patient had 2 CT scans at outside facility which were unremarkable.  Lipase is normal.  Mild elevation of liver test, likely unrelated especially given unremarkable CT scan.  Patient had an EGD that admission.  EGD 11/08/2018 with LA grade B reflux/vomiting related to esophagitis, nodular mucosa in the esophagus and otherwise normal.  There is no clear cause identified for nausea and vomiting it was thought constipation could be the main culprit.  It was recommended that time the patient undergo a bowel prep while in the hospital but patient became agitated on his second day here and family thought it best to take him home.    Today, the patient presents to the ER accompanied by his mother who tells me that "I just do not know what to do with him anymore".  Tells me he has only had one very small ball of stool over the past 4 weeks.  Apparently he fell while having a seizure about 4 weeks ago and since then has been having episodes of nausea and vomiting with constipation.  She said "maybe he hit his stomach".  As above patient has had multiple CT scans since then as well as labs.  After recent discharge mom says that she did to suppositories, she was unable to do an enema because he would not cooperate.  She was giving him Dulcolax stool softeners over the weekend, but the patient has still not had a bowel movement.  She does tell me  that he has some "hard farts" at times, and has continued to pass gas over this time period.  At home mom says that he looks uncomfortable explaining that he will move around back and forth on the couch and lean back and hold his stomach, on the toilet he seems to rock as well with no bowel movement.  He did have further nausea and vomiting on Thursday of last week, but none on Friday or Saturday and then on Sunday he started vomiting again.  She tells me that he was able to get in some food though over the past few days without vomiting.  This morning he again started to put his fingers down his throat to make himself vomit but "he vomits on his own sometimes to".    During last hospitalization the patient was very agitated and mom says it was traumatic for him.  It sounds as though there are some family social factors which play a role in this.  Mom gets a lot of opinions from all sides per her.  No one thinks that she is doing anything right for her.    Denies fever, chills or blood in his stool.  Past Medical History:  Diagnosis Date  . Autistic disorder, current or active state   . Fatty liver   . Hypertension   . Obesity   . Pancreatitis    at age 13 years  .  Pre-diabetes   . Seizures ([place])    as of 11/23/15 - no seizures for 3 month  . Sleep apnea    not on cpap (can't tolerate)  . Tics of organic origin   . Tourette syndrome     Past Surgical History:  Procedure Laterality Date  . BIOPSY  11/08/2018   Procedure: BIOPSY;  Surgeon: Jerene Bears, MD;  Location: [facility];  Service: Gastroenterology;;  . ESOPHAGOGASTRODUODENOSCOPY (EGD) WITH PROPOFOL N/A 11/08/2018   Procedure: ESOPHAGOGASTRODUODENOSCOPY (EGD) WITH PROPOFOL;  Surgeon: Jerene Bears, MD;  Location: [facility];  Service: Gastroenterology;  Laterality: N/A;  . RADIOLOGY WITH ANESTHESIA N/A 11/24/2015   Procedure: CT SCAN ABDOMEN AND PELVIS WITH CONTRAST;  Surgeon: Medication Radiologist, MD;  Location: [facility];   Service: Radiology;  Laterality: N/A;  . TONSILLECTOMY     and adenoidectomy    Family History  Problem Relation Age of Onset  . Seizures Maternal Aunt   . Anxiety disorder Paternal Uncle   . Depression Paternal Uncle   . Bipolar disorder Paternal Uncle   . Seizures Cousin        Maternal 1st and 2nd cousins have seizures  . Anxiety disorder Other        Paternal fhx  . Depression Other        Paternal fhx  . Hypertension Other        Paternal fhx  . Bipolar disorder Other        Paternal fhx    Social History   Tobacco Use  . Smoking status: Never Smoker  . Smokeless tobacco: Never Used  Substance Use Topics  . Alcohol use: Never    Frequency: Never  . Drug use: Never    Prior to Admission medications   Medication Sig Start Date End Date Taking? Authorizing Provider  citalopram (CELEXA) 40 MG tablet Take 40 mg by mouth daily. 04/24/14   [provider]  cloBAZam (ONFI) 20 MG tablet Take 60 mg by mouth 2 (two) times daily. 10/16/18   [provider]  diazepam (DIASAT) 20 MG GEL Place 20 mg rectally daily as needed (for seizures lasting more than 5 minutes.).  07/03/13   [provider]  diazepam (VALIUM) 5 MG tablet Take 7.5 mg by mouth every morning. May give 0.5 tablet (2.8m) as needed for agitation/anxiety 10/15/18   [provider]  lisinopril (PRINIVIL,ZESTRIL) 20 MG tablet Take 20 mg by mouth daily. Take with 537mtablet for a total dose of 2553m/22/16   [provider]  lisinopril (ZESTRIL) 5 MG tablet Take 5 mg by mouth daily. Take with 14m73mblet for a total dose of 25mg81m0/20   [provider]  Melatonin 5 MG TABS Take 5 mg by mouth at bedtime.    [provider]  Midazolam (NAYZILAM) 5 MG/0.1ML SOLN Place 5 mg into the nose daily as needed (seizure).    [provider]  nystatin (MYCOSTATIN/NYSTOP) 100000 UNIT/GM POWD Apply 1 application topically daily. Neck, crevices, etc. 04/23/14    [provider]  omeprazole (FIRST-OMEPRAZOLE) 2 mg/mL SUSP oral suspension Take 20 mLs (40 mg total) by mouth daily before breakfast. 11/08/18   Dhungel, Nishant, MD  ondansetron (ZOFRAN) 4 MG tablet Take 1 tablet (4 mg total) by mouth every 8 (eight) hours as needed for nausea or vomiting. 11/08/18 11/08/19  Dhungel, Nishant, MD  polyethylene glycol (MIRALAX) 17 g packet Take 17 g by mouth daily. 11/08/18   DhungLouellen Molder  VIMPAT 10 MG/ML oral solution Take 10 mLs by mouth 2 (two) times daily. 10/28/18   [provider]    Current Facility-Administered Medications  Medication Dose Route Frequency Provider Last Rate Last Dose  . peg 3350 powder (MOVIPREP) kit 200 g  1 kit Oral Once Kinnie Feil, PA-C       Current Outpatient Medications  Medication Sig Dispense Refill  . citalopram (CELEXA) 40 MG tablet Take 40 mg by mouth daily.  4  . cloBAZam (ONFI) 20 MG tablet Take 60 mg by mouth 2 (two) times daily.    . diazepam (DIASAT) 20 MG GEL Place 20 mg rectally daily as needed (for seizures lasting more than 5 minutes.).     Marland Kitchen diazepam (VALIUM) 5 MG tablet Take 7.5 mg by mouth every morning. May give 0.5 tablet (2.43m) as needed for agitation/anxiety    . lisinopril (PRINIVIL,ZESTRIL) 20 MG tablet Take 20 mg by mouth daily. Take with 569mtablet for a total dose of 2562m9  . lisinopril (ZESTRIL) 5 MG tablet Take 5 mg by mouth daily. Take with 52m11mblet for a total dose of 25mg36m. Melatonin 5 MG TABS Take 5 mg by mouth at bedtime.    . Midazolam (NAYZILAM) 5 MG/0.1ML SOLN Place 5 mg into the nose daily as needed (seizure).    . nystatin (MYCOSTATIN/NYSTOP) 100000 UNIT/GM POWD Apply 1 application topically daily. Neck, crevices, etc.  2  . omeprazole (FIRST-OMEPRAZOLE) 2 mg/mL SUSP oral suspension Take 20 mLs (40 mg total) by mouth daily before breakfast. 300 mL 0  . ondansetron (ZOFRAN) 4 MG tablet Take 1 tablet (4 mg total) by mouth every 8 (eight) hours as needed  for nausea or vomiting. 30 tablet 0  . polyethylene glycol (MIRALAX) 17 g packet Take 17 g by mouth daily. 14 each 0  . VIMPAT 10 MG/ML oral solution Take 10 mLs by mouth 2 (two) times daily.      Allergies as of 11/12/2018  . (No Known Allergies)     Review of Systems:   (per mom) Constitutional: No weight loss, fever or chills Skin: No rash  Cardiovascular: No chest pain Respiratory: No SOB Gastrointestinal: See HPI and otherwise negative Genitourinary: No dysuria  Neurological: No headache, dizziness or syncope Musculoskeletal: No new muscle or joint pain Hematologic: No bleeding  Psychiatric: +severe autism   Physical Exam:  Vital signs in last 24 hours: Pulse Rate:  [130] 130 (10/19 1243) Resp:  [18] 18 (10/19 1243) BP: (127)/(92) 127/92 (10/19 1243) SpO2:  [99 %] 99 % (10/19 1243)   General:   Pleasant obese Caucasian male appears to be in NAD, Well developed, Well nourished, alert and cooperative Head:  Normocephalic and atraumatic. Eyes:   PEERL, EOMI. No icterus. Conjunctiva pink. Ears:  Normal auditory acuity. Neck:  Supple Throat: Oral cavity and pharynx without inflammation, swelling or lesion.  Lungs: Respirations even and unlabored. Lungs clear to auscultation bilaterally.   No wheezes, crackles, or rhonchi.  Heart: Normal S1, S2. No MRG. Regular rate and rhythm. No peripheral edema, cyanosis or pallor.  Abdomen:  Soft, nondistended, nontender. No rebound or guarding. Decreased BS all 4 quadrants. No appreciable masses or hepatomegaly.(limited as patient would not get back in the bed to lay down) Rectal:  Patient did not allow Msk:  Symmetrical without gross deformities. Peripheral pulses intact.  Extremities:  Without edema, no deformity or joint abnormality. Normal ROM, normal sensation. Neurologic:  Alert  Skin:   Dry and intact without significant lesions or rashes. Psychiatric: unable to verbally communicate   Impression / Plan:   Impression: 1.   Constipation: For the past 4 weeks per mother, CT over that time period was normal, will try a bowel prep  Plan: 1.  Discussed options with mom today.  Explained that if he were admitted to the hospital we would try and have him drink a bowel prep overnight, if that did not work then would take him down to the Endo lab and give him some anesthesia and try to clean him out under anesthesia.  Reviewing the patient's last stay in the hospital it seems as it was very traumatic for him.  Together we decided that it would be a better idea to send her home with the bowel prep and have her give that a try overnight. 2.  Patient will be sent home with movi prep.  Explained to mom that he should drink as much as he possibly can tonight, at least half if possible and another half tomorrow.  She can mix this with absolutely anything in order to get him to drink it. 3.  If patient remains constipated or cannot complete bowel prep mom will call our clinic in the morning.  He may need a flex sigmoidoscopy to try and remove some stool.  She is aware of the plan.  They do have follow-up in our outpatient clinic scheduled for this Thursday 10/22 with Dr. Lyndel Safe.  In the long run patient will likely need to be on an oral laxative such as Linzess or something that can be broken up in his food. 4.  Discussed above plan with Dr. Ardis Hughs.  Thank you for your kind consultation, we will sign off.  Lavone Nian Lemmon  11/12/2018, 3:18 PM  ________________________________________________________________________  Velora Heckler GI MD note:  I personally examined the patient, reviewed the data and agree with the assessment and plan described above. Mom reports no substantial BM in weeks.  Eating poorly and he's lost some weight. He appears comfortable and clearly does not have a bowel obstruction.  I think bowel prep to purge his system is the best first step here.  His mother hopes he'll keep it down.  I recommended she alternate  cups of water with cups of prep but emphasized it is important he take all the prep.    Owens Loffler, MD [facility] Gastroenterology Pager [phone]

## 2018-11-12 NOTE — Telephone Encounter (Signed)
Is unable to get him to have a BM, tells me she has tried "everything" is looking for advice please.

## 2018-11-12 NOTE — ED Provider Notes (Addendum)
[facility] DEPT Provider Note   CSN: [id number] Arrival date & time: 11/12/18  1227     History   Chief Complaint Chief Complaint  Patient presents with  . Constipation    HPI Aaron Vega is a 23 y.o. male with history of autism, non verbal, seizures, obesity brought to ER by mother for evaluation of ongoing refractory constipation for the last 4 weeks.  She reports no bowel movements at all.  Minimal flatus.  Patient has been vomiting both spontaneously after eating and drinking but he is also shoving finger down his throat to make himself throw up. Mother can tell he is uncomfortable.  Temperature as high as 100.3.  No hematemesis, coffee ground emesis.  Mother reports several ER visits this month for same. Was seen at [facility] ER on 10/5 and 10/10 and had CT scans done. Per mother he was admitted last week and had an EGD and doctors told him he was "impacted".  Doctors tired to "disimpact" in the hospital but patient was not cooperative and was ripping off his IVs. Eventually GI unable to disimpact and mother took patient home with hopes of attempting oral medicines and laxatives. Mother has been doing laxatives, suppositories without relief.  Patient's mother just wants the stool "out". She called GI and Dr Lyndel Safe told them to come to ER for disimpaction.      HPI  Past Medical History:  Diagnosis Date  . Autistic disorder, current or active state   . Fatty liver   . Hypertension   . Obesity   . Pancreatitis    at age 57 years  . Pre-diabetes   . Seizures ([place])    as of 11/23/15 - no seizures for 3 month  . Sleep apnea    not on cpap (can't tolerate)  . Tics of organic origin   . Tourette syndrome     Patient Active Problem List   Diagnosis Date Noted  . Reflux esophagitis 11/08/2018  . Constipation in male 11/08/2018  . Vomiting in adult   . Intractable nausea and vomiting 11/07/2018  . Obesity (BMI 30-39.9) 11/07/2018  . Seizure  disorder ([place]) 11/07/2018  . Dehydration   . Tics of organic origin 09/12/2012  . Autistic disorder 09/12/2012    Past Surgical History:  Procedure Laterality Date  . BIOPSY  11/08/2018   Procedure: BIOPSY;  Surgeon: Jerene Bears, MD;  Location: [facility];  Service: Gastroenterology;;  . ESOPHAGOGASTRODUODENOSCOPY (EGD) WITH PROPOFOL N/A 11/08/2018   Procedure: ESOPHAGOGASTRODUODENOSCOPY (EGD) WITH PROPOFOL;  Surgeon: Jerene Bears, MD;  Location: [facility];  Service: Gastroenterology;  Laterality: N/A;  . RADIOLOGY WITH ANESTHESIA N/A 11/24/2015   Procedure: CT SCAN ABDOMEN AND PELVIS WITH CONTRAST;  Surgeon: Medication Radiologist, MD;  Location: [facility];  Service: Radiology;  Laterality: N/A;  . TONSILLECTOMY     and adenoidectomy        Home Medications    Prior to Admission medications   Medication Sig Start Date End Date Taking? Authorizing Provider  citalopram (CELEXA) 40 MG tablet Take 40 mg by mouth daily. 04/24/14   [provider]  cloBAZam (ONFI) 20 MG tablet Take 60 mg by mouth 2 (two) times daily. 10/16/18   [provider]  diazepam (DIASAT) 20 MG GEL Place 20 mg rectally daily as needed (for seizures lasting more than 5 minutes.).  07/03/13   [provider]  diazepam (VALIUM) 5 MG tablet Take 7.5 mg by mouth every morning. May give  0.5 tablet (2.68m) as needed for agitation/anxiety 10/15/18   [provider]  lisinopril (PRINIVIL,ZESTRIL) 20 MG tablet Take 20 mg by mouth daily. Take with 554mtablet for a total dose of 2551m/22/16   [provider]  lisinopril (ZESTRIL) 5 MG tablet Take 5 mg by mouth daily. Take with 74m100mblet for a total dose of 25mg81m0/20   [provider]  Melatonin 5 MG TABS Take 5 mg by mouth at bedtime.    [provider]  Midazolam (NAYZILAM) 5 MG/0.1ML SOLN Place 5 mg into the nose daily as needed (seizure).    [provider]  nystatin (MYCOSTATIN/NYSTOP) 100000  UNIT/GM POWD Apply 1 application topically daily. Neck, crevices, etc. 04/23/14   [provider]  omeprazole (FIRST-OMEPRAZOLE) 2 mg/mL SUSP oral suspension Take 20 mLs (40 mg total) by mouth daily before breakfast. 11/08/18   Dhungel, Nishant, MD  ondansetron (ZOFRAN ODT) 4 MG disintegrating tablet Take 1 tablet (4 mg total) by mouth every 8 (eight) hours as needed for nausea or vomiting. 11/12/18   GibboKinnie FeilC  ondansetron (ZOFRAN) 4 MG tablet Take 1 tablet (4 mg total) by mouth every 8 (eight) hours as needed for nausea or vomiting. 11/08/18 11/08/19  Dhungel, Nishant, MD  polyethylene glycol (MIRALAX) 17 g packet Take 17 g by mouth daily. 11/08/18   Dhungel, NishaFlonnie Overman promethazine (PHENERGAN) 25 MG suppository Place 1 suppository (25 mg total) rectally every 6 (six) hours as needed for nausea or vomiting. 11/12/18   GibboKinnie FeilC  VIMPAT 10 MG/ML oral solution Take 10 mLs by mouth 2 (two) times daily. 10/28/18   [provider]    Family History Family History  Problem Relation Age of Onset  . Seizures Maternal Aunt   . Anxiety disorder Paternal Uncle   . Depression Paternal Uncle   . Bipolar disorder Paternal Uncle   . Seizures Cousin        Maternal 1st and 2nd cousins have seizures  . Anxiety disorder Other        Paternal fhx  . Depression Other        Paternal fhx  . Hypertension Other        Paternal fhx  . Bipolar disorder Other        Paternal fhx    Social History Social History   Tobacco Use  . Smoking status: Never Smoker  . Smokeless tobacco: Never Used  Substance Use Topics  . Alcohol use: Never    Frequency: Never  . Drug use: Never     Allergies   Patient has no known allergies.   Review of Systems Review of Systems  Unable to perform ROS: Patient nonverbal  Gastrointestinal: Positive for constipation, nausea and vomiting.  All other systems reviewed and are negative.    Physical Exam Updated Vital  Signs BP (!) 127/92 (BP Location: Right Arm)   Pulse (!) 130   Resp 18   SpO2 99%   Physical Exam Vitals signs and nursing note reviewed.  Constitutional:      Appearance: He is well-developed.     Comments: No distress. Listening to music and playing with his mirror. Non verbal.   HENT:     Head: Normocephalic and atraumatic.     Nose: Nose normal.  Eyes:     Conjunctiva/sclera: Conjunctivae normal.  Neck:     Musculoskeletal: Normal range of motion.  Cardiovascular:     Rate and Rhythm: Normal rate  and regular rhythm.     Heart sounds: Normal heart sounds.  Pulmonary:     Effort: Pulmonary effort is normal.     Breath sounds: Normal breath sounds.  Abdominal:     General: Bowel sounds are normal.     Palpations: Abdomen is soft.     Tenderness: There is no abdominal tenderness.     Comments: Unreliable exam due to patient cooperation.  Abdomen obese, soft.  Patient is blocking my hand from touching his abdomen, mother at bedside assists with exam. Some grimace with diffuse palpation. No G/R.  Unable to auscultate BS  Genitourinary:    Comments: Attempted to do DRE and disimpaction.  Patient given ativan 2 mg prior to exam.  Wife, RN, EMT and myself unable to perform exam patient kicking, pulling away and clenching buttocks.  Unable to assess perianal skin.  Musculoskeletal: Normal range of motion.  Skin:    General: Skin is warm and dry.     Capillary Refill: Capillary refill takes less than 2 seconds.  Neurological:     Mental Status: He is alert.  Psychiatric:        Behavior: Behavior normal.      ED Treatments / Results  Labs (all labs ordered are listed, but only abnormal results are displayed) Labs Reviewed - No data to display  EKG None  Radiology No results found.  Procedures Procedures (including critical care time)  Medications Ordered in ED Medications  peg 3350 powder (MOVIPREP) kit 200 g (has no administration in time range)  LORazepam  (ATIVAN) injection 2 mg (2 mg Intramuscular Given 11/12/18 1331)     Initial Impression / Assessment and Plan / ED Course  I have reviewed the triage vital signs and the nursing notes.  Pertinent labs & imaging results that were available during my care of the patient were reviewed by me and considered in my medical decision making (see chart for details).  Clinical Course as of Nov 11 1541  Mon Nov 12, 2018  1519 GI PA Fabio Asa evaluated patient in ER.  Recommends bowel prep and discharge. GI appointment on 10/22    [CG]    Clinical Course User Index [CG] Kinnie Feil, PA-C   EMR reviewed.    I have reviewed patient's recent hospitalization.  GI unable to give miralax due to patient agitation in hospital.    CT abdomen at outside facility 10/10 and 10/5 without obstruction, hernia or acute findings.  EGD at last admission reviewed showed esophigitis and GI suspected GERD or from recent vomiting, biopsies obtained and pending.    Evaluation today in ER is reassuring.  He appears to be in no distress.  He has no abd guarding, rigidity although exam is limited due to autism however when mother palpates abdomen patient does not appear to be in pain.  No emesis here.   Attempted disimpaction/DRE but unsuccessful.  Ativan was tried but patient was too agitated, kicking.    Given chronicity of symptoms, benign abd exam, and no clinical decline on re-evaluation I don't think emergent imaging, labs indicated.   GI PA evaluated patient, appreciate their input.  Recommend bowel prep and f/u in office. GI appointment on 10/22.    Return precautions discussed with mother who is in agreement. Discussed with EDP.  Final Clinical Impressions(s) / ED Diagnoses   Final diagnoses:  Constipation, unspecified constipation type    ED Discharge Orders         Ordered  promethazine (PHENERGAN) 25 MG suppository  Every 6 hours PRN     11/12/18 1543    ondansetron (ZOFRAN ODT) 4 MG  disintegrating tablet  Every 8 hours PRN     11/12/18 1543           Kinnie Feil, PA-C 11/12/18 1543    Sherwood Gambler, MD 11/13/18 [phone]

## 2018-11-12 NOTE — Telephone Encounter (Signed)
Pt's mother called again stating that she will take pt to [facility] ED because he is in pain due to not being able to have a bm. She wanted Dr. Lyndel Safe to be aware of that.

## 2018-11-12 NOTE — ED Notes (Signed)
Pt wouldn't get close enough to the monitor so I could get his vitals

## 2018-11-15 ENCOUNTER — Ambulatory Visit: Payer: Medicaid Other | Admitting: Gastroenterology

## 2018-11-15 NOTE — Anesthesia Postprocedure Evaluation (Signed)
Anesthesia Post Note  Patient: Aaron Vega  Procedure(s) Performed: ESOPHAGOGASTRODUODENOSCOPY (EGD) WITH PROPOFOL (N/A ) BIOPSY     Patient location during evaluation: Endoscopy Anesthesia Type: MAC Level of consciousness: awake and patient cooperative Pain management: pain level controlled Vital Signs Assessment: post-procedure vital signs reviewed and stable Respiratory status: spontaneous breathing, nonlabored ventilation, respiratory function stable and patient connected to nasal cannula oxygen Cardiovascular status: stable and blood pressure returned to baseline Postop Assessment: no apparent nausea or vomiting Anesthetic complications: no    Last Vitals:  Vitals:   11/08/18 1400 11/08/18 1410  BP: 140/81 (!) 150/95  Pulse: (!) 103 100  Resp: 18 16  Temp:    SpO2: 99% 100%    Last Pain:  Vitals:   11/08/18 1410  TempSrc:   PainSc: 0-No pain                 Dallana Mavity

## 2018-11-19 ENCOUNTER — Telehealth: Payer: Self-pay | Admitting: Gastroenterology

## 2018-11-19 NOTE — Telephone Encounter (Signed)
Duplicate message. 

## 2018-11-19 NOTE — Telephone Encounter (Signed)
Pt's mother stated that she is returning your call from Friday.

## 2018-11-19 NOTE — Telephone Encounter (Signed)
Called and spoke with patient's mother reports the patient is still having constipation with nausea and vomiting-[name] report she has tried "everything from suppositories to enemas, stool softeners and Miralax and nothing is helping"; [name] reports the patient will not let her assist in removing the impacted stool-[name] reports she will keep the appt with Dr. Lyndel Safe and she reports she will feel more comfortable with her son seeing a doctor"; Zofran is not working for the nausea-patient attempts to self-induce vomiting  [name] also reports the ER MD informed her the patient has a large amount of stool in colon  Please advise of next step in plan of care - "is there anything else I can do for him while we are at home so he can have some relief?"

## 2018-11-20 ENCOUNTER — Other Ambulatory Visit: Payer: Self-pay

## 2018-11-20 ENCOUNTER — Emergency Department (HOSPITAL_COMMUNITY): Payer: Medicaid Other

## 2018-11-20 ENCOUNTER — Encounter (HOSPITAL_COMMUNITY): Payer: Self-pay | Admitting: Emergency Medicine

## 2018-11-20 ENCOUNTER — Inpatient Hospital Stay (HOSPITAL_COMMUNITY)
Admission: EM | Admit: 2018-11-20 | Discharge: 2018-11-27 | DRG: 388 | Disposition: A | Discharge status: Home Health | Payer: Medicaid Other | Attending: Family Medicine | Admitting: Family Medicine

## 2018-11-20 ENCOUNTER — Telehealth: Payer: Self-pay | Admitting: Gastroenterology

## 2018-11-20 DIAGNOSIS — G4730 Sleep apnea, unspecified: Secondary | ICD-10-CM | POA: Diagnosis present

## 2018-11-20 DIAGNOSIS — Z818 Family history of other mental and behavioral disorders: Secondary | ICD-10-CM

## 2018-11-20 DIAGNOSIS — Z79890 Hormone replacement therapy (postmenopausal): Secondary | ICD-10-CM

## 2018-11-20 DIAGNOSIS — R935 Abnormal findings on diagnostic imaging of other abdominal regions, including retroperitoneum: Secondary | ICD-10-CM

## 2018-11-20 DIAGNOSIS — R1011 Right upper quadrant pain: Secondary | ICD-10-CM

## 2018-11-20 DIAGNOSIS — F952 Tourette's disorder: Secondary | ICD-10-CM | POA: Diagnosis present

## 2018-11-20 DIAGNOSIS — Z20828 Contact with and (suspected) exposure to other viral communicable diseases: Secondary | ICD-10-CM | POA: Diagnosis present

## 2018-11-20 DIAGNOSIS — Y92230 Patient room in hospital as the place of occurrence of the external cause: Secondary | ICD-10-CM | POA: Diagnosis present

## 2018-11-20 DIAGNOSIS — R7303 Prediabetes: Secondary | ICD-10-CM | POA: Diagnosis present

## 2018-11-20 DIAGNOSIS — I10 Essential (primary) hypertension: Secondary | ICD-10-CM | POA: Diagnosis present

## 2018-11-20 DIAGNOSIS — K5904 Chronic idiopathic constipation: Secondary | ICD-10-CM

## 2018-11-20 DIAGNOSIS — T426X5A Adverse effect of other antiepileptic and sedative-hypnotic drugs, initial encounter: Secondary | ICD-10-CM | POA: Diagnosis present

## 2018-11-20 DIAGNOSIS — R112 Nausea with vomiting, unspecified: Secondary | ICD-10-CM | POA: Diagnosis not present

## 2018-11-20 DIAGNOSIS — R739 Hyperglycemia, unspecified: Secondary | ICD-10-CM

## 2018-11-20 DIAGNOSIS — K5900 Constipation, unspecified: Secondary | ICD-10-CM | POA: Diagnosis not present

## 2018-11-20 DIAGNOSIS — G40909 Epilepsy, unspecified, not intractable, without status epilepticus: Secondary | ICD-10-CM | POA: Diagnosis present

## 2018-11-20 DIAGNOSIS — K5641 Fecal impaction: Principal | ICD-10-CM | POA: Diagnosis present

## 2018-11-20 DIAGNOSIS — Z6841 Body Mass Index (BMI) 40.0 and over, adult: Secondary | ICD-10-CM

## 2018-11-20 DIAGNOSIS — K920 Hematemesis: Secondary | ICD-10-CM

## 2018-11-20 DIAGNOSIS — K760 Fatty (change of) liver, not elsewhere classified: Secondary | ICD-10-CM | POA: Diagnosis present

## 2018-11-20 DIAGNOSIS — Z8249 Family history of ischemic heart disease and other diseases of the circulatory system: Secondary | ICD-10-CM

## 2018-11-20 DIAGNOSIS — K2101 Gastro-esophageal reflux disease with esophagitis, with bleeding: Secondary | ICD-10-CM | POA: Diagnosis present

## 2018-11-20 DIAGNOSIS — E876 Hypokalemia: Secondary | ICD-10-CM | POA: Diagnosis present

## 2018-11-20 DIAGNOSIS — K5649 Other impaction of intestine: Secondary | ICD-10-CM

## 2018-11-20 DIAGNOSIS — Z0189 Encounter for other specified special examinations: Secondary | ICD-10-CM

## 2018-11-20 DIAGNOSIS — F840 Autistic disorder: Secondary | ICD-10-CM | POA: Diagnosis present

## 2018-11-20 DIAGNOSIS — R945 Abnormal results of liver function studies: Secondary | ICD-10-CM

## 2018-11-20 DIAGNOSIS — R946 Abnormal results of thyroid function studies: Secondary | ICD-10-CM | POA: Diagnosis present

## 2018-11-20 DIAGNOSIS — F418 Other specified anxiety disorders: Secondary | ICD-10-CM | POA: Diagnosis present

## 2018-11-20 DIAGNOSIS — R7989 Other specified abnormal findings of blood chemistry: Secondary | ICD-10-CM

## 2018-11-20 DIAGNOSIS — K649 Unspecified hemorrhoids: Secondary | ICD-10-CM | POA: Diagnosis present

## 2018-11-20 DIAGNOSIS — R109 Unspecified abdominal pain: Secondary | ICD-10-CM

## 2018-11-20 DIAGNOSIS — E860 Dehydration: Secondary | ICD-10-CM | POA: Diagnosis present

## 2018-11-20 DIAGNOSIS — Z79899 Other long term (current) drug therapy: Secondary | ICD-10-CM

## 2018-11-20 LAB — COMPREHENSIVE METABOLIC PANEL
ALT: 112 U/L — ABNORMAL HIGH (ref 0–44)
AST: 75 U/L — ABNORMAL HIGH (ref 15–41)
Albumin: 4.6 g/dL (ref 3.5–5.0)
Alkaline Phosphatase: 78 U/L (ref 38–126)
Anion gap: 13 (ref 5–15)
BUN: 9 mg/dL (ref 6–20)
CO2: 25 mmol/L (ref 22–32)
Calcium: 9.7 mg/dL (ref 8.9–10.3)
Chloride: 99 mmol/L (ref 98–111)
Creatinine, Ser: 0.73 mg/dL (ref 0.61–1.24)
GFR calc Af Amer: >60 mL/min (ref >60)
GFR calc non Af Amer: >60 mL/min (ref >60)
Glucose, Bld: 109 mg/dL — ABNORMAL HIGH (ref 70–99)
Potassium: 3.9 mmol/L (ref 3.5–5.1)
Sodium: 137 mmol/L (ref 135–145)
Total Bilirubin: 1.4 mg/dL — ABNORMAL HIGH (ref 0.3–1.2)
Total Protein: 7.8 g/dL (ref 6.5–8.1)

## 2018-11-20 LAB — CBC WITH DIFFERENTIAL/PLATELET
Abs Immature Granulocytes: 0.03 10*3/uL (ref 0.00–0.07)
Basophils Absolute: 0 10*3/uL (ref 0.0–0.1)
Basophils Relative: 0 %
Eosinophils Absolute: 0 10*3/uL (ref 0.0–0.5)
Eosinophils Relative: 0 %
HCT: 43.1 % (ref 39.0–52.0)
Hemoglobin: 14.1 g/dL (ref 13.0–17.0)
Immature Granulocytes: 0 %
Lymphocytes Relative: 24 %
Lymphs Abs: 2 10*3/uL (ref 0.7–4.0)
MCH: 27.8 pg (ref 26.0–34.0)
MCHC: 32.7 g/dL (ref 30.0–36.0)
MCV: 85 fL (ref 80.0–100.0)
Monocytes Absolute: 0.8 10*3/uL (ref 0.1–1.0)
Monocytes Relative: 10 %
Neutro Abs: 5.3 10*3/uL (ref 1.7–7.7)
Neutrophils Relative %: 66 %
Platelets: 389 10*3/uL (ref 150–400)
RBC: 5.07 MIL/uL (ref 4.22–5.81)
RDW: 12.5 % (ref 11.5–15.5)
WBC: 8.2 10*3/uL (ref 4.0–10.5)
nRBC: 0 % (ref 0.0–0.2)

## 2018-11-20 IMAGING — CR DG ABDOMEN ACUTE W/ 1V CHEST
8 of 9 series · 8 of 9 positions shown · non-contrast
Comparison: [DATE]

CLINICAL DATA: Constipation, pain, vomiting

EXAM:
DG ABDOMEN ACUTE W/ 1V CHEST

[x chest ap (1 of 2)]
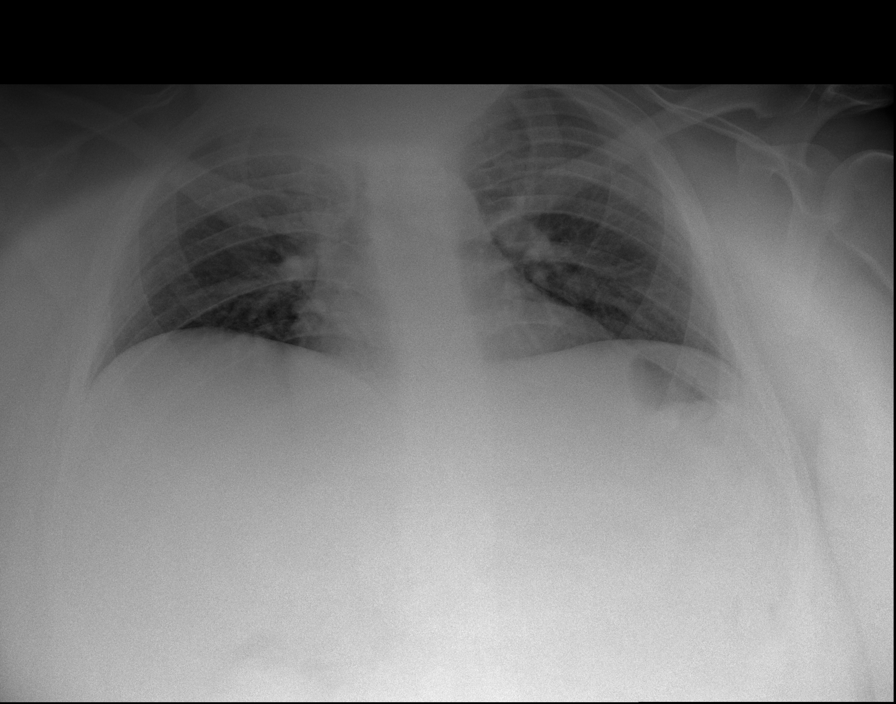

[x chest ap (2 of 2)]
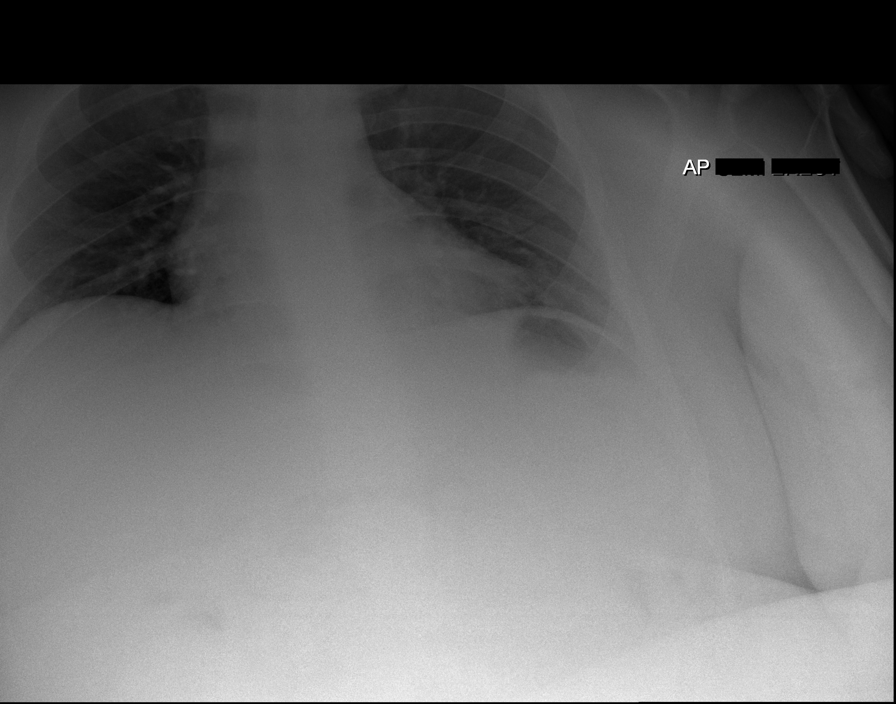

[x abdomen erect (1 of 3)]
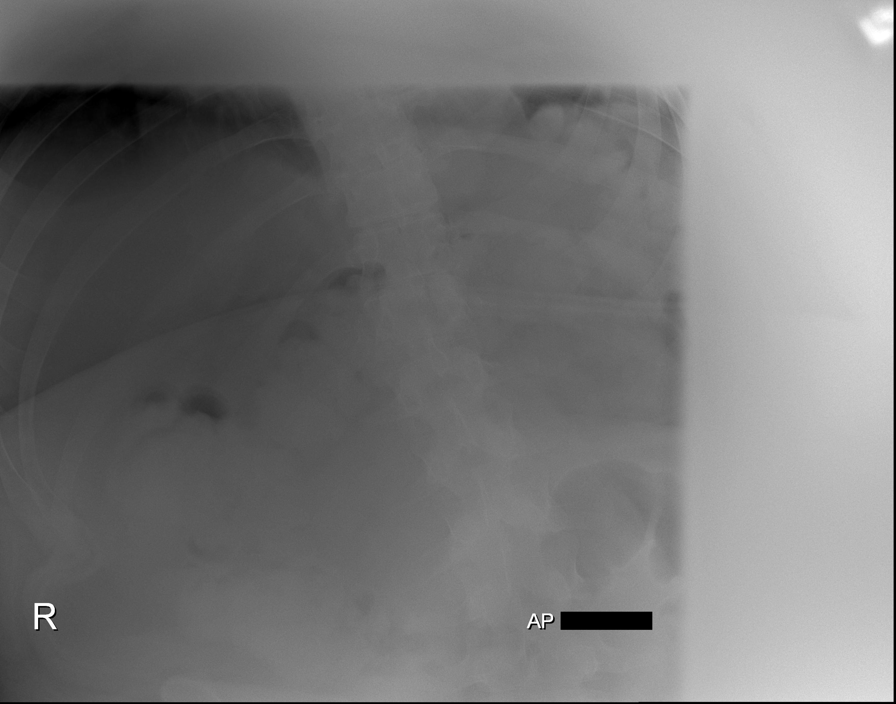

[x abdomen erect (2 of 3)]
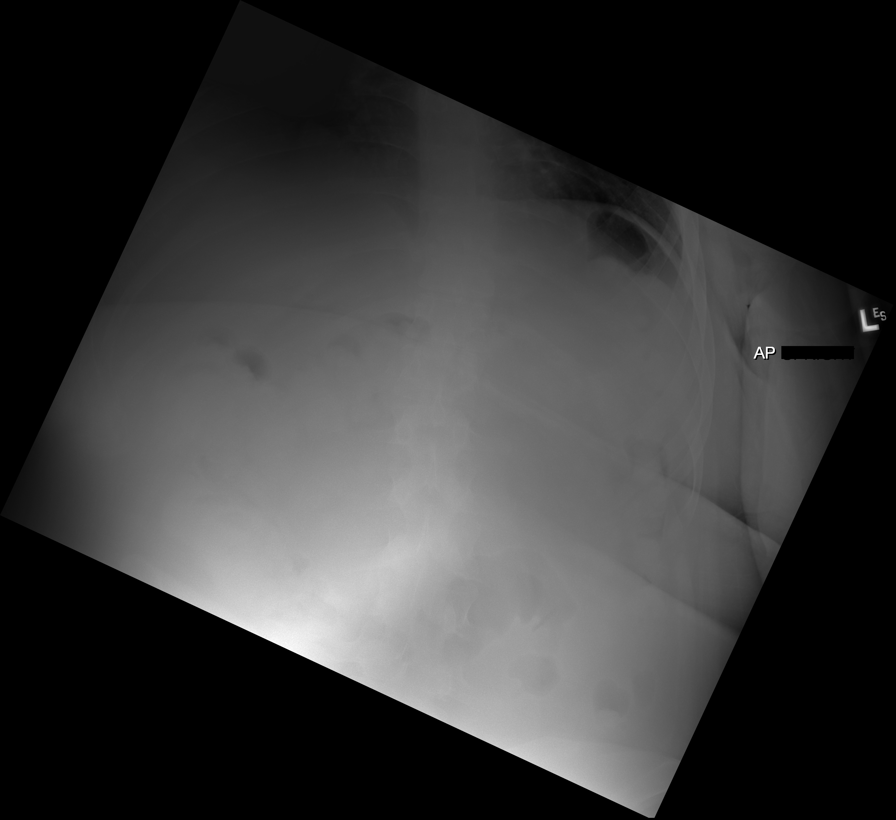

[x abdomen erect (3 of 3)]
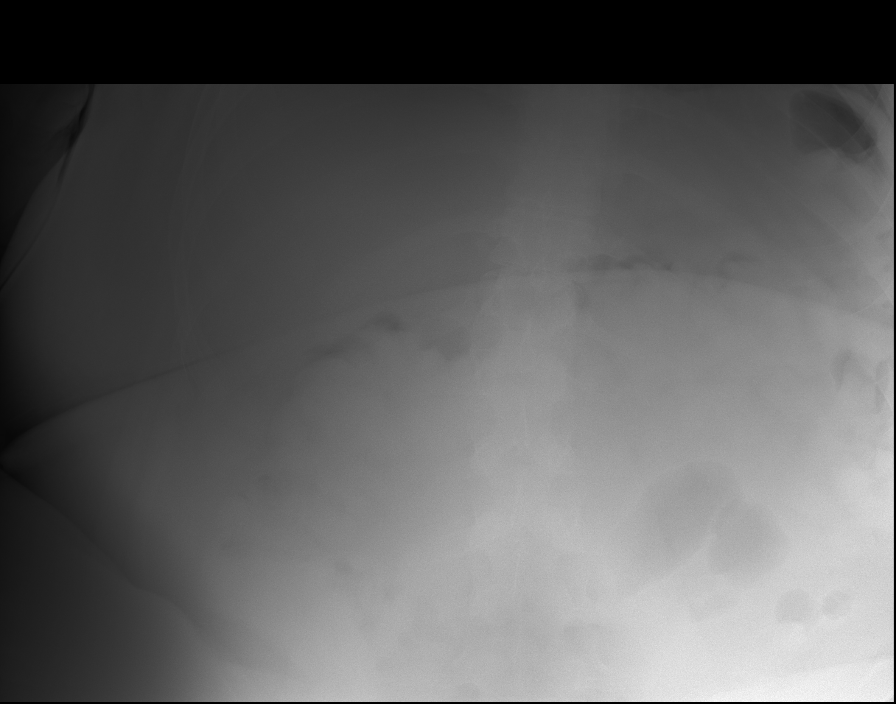

[t abdomen supine (1 of 3)]
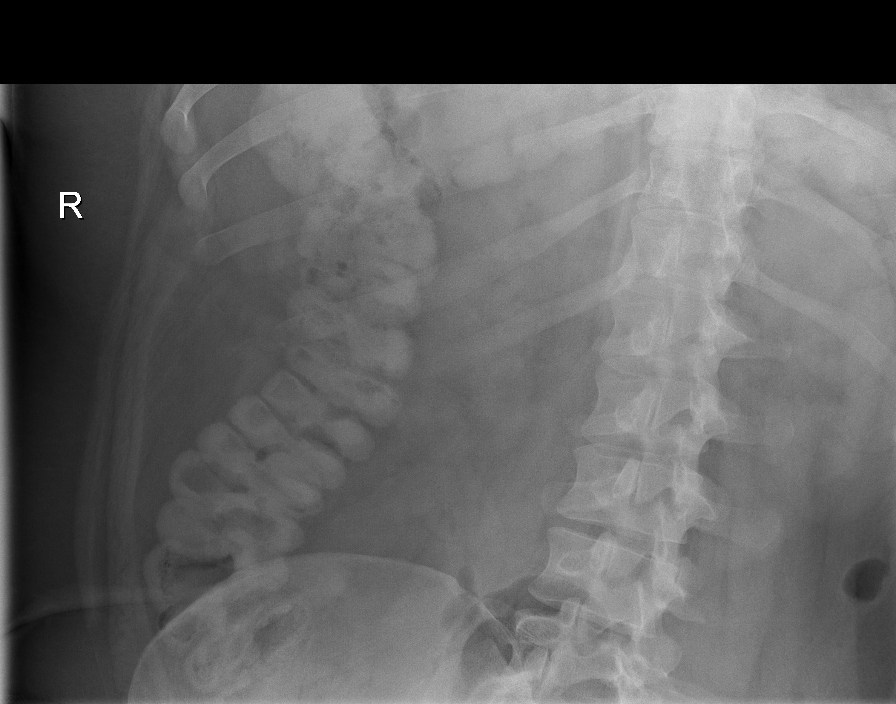

[t abdomen supine (2 of 3)]
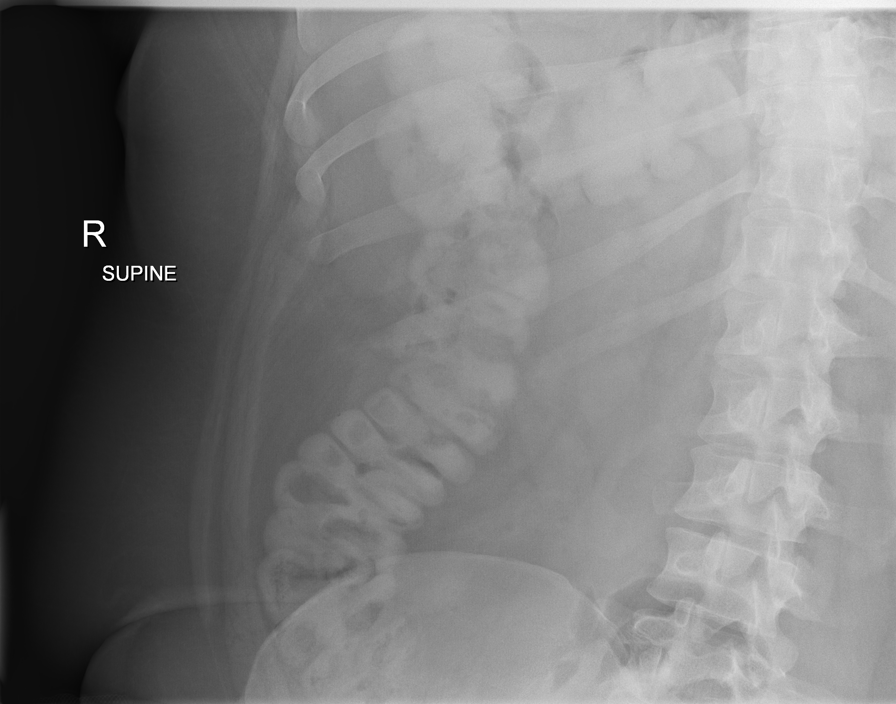

[t abdomen supine (3 of 3)]
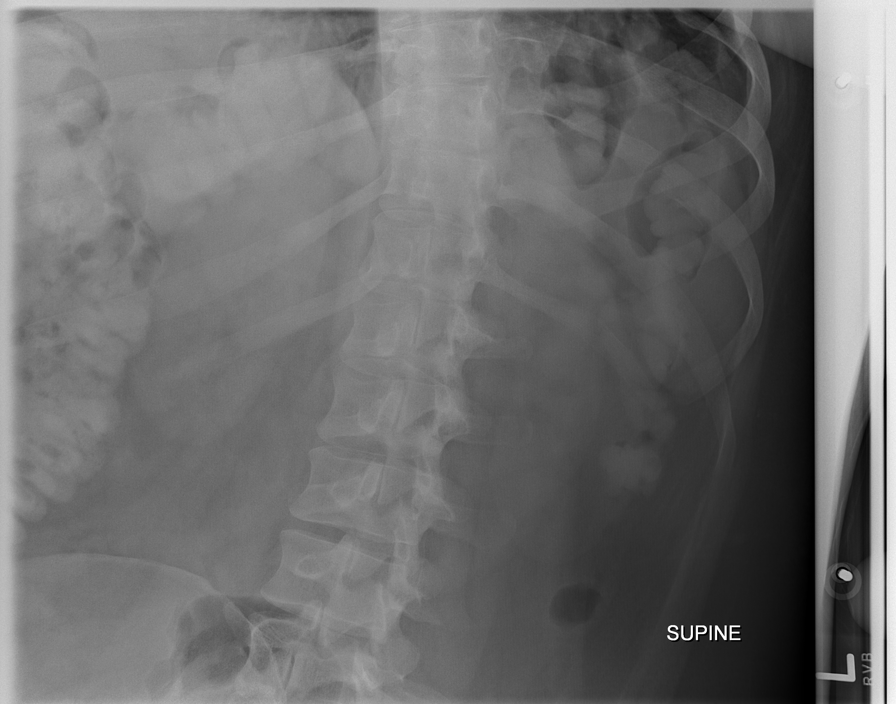

[8 of 9 positions shown; findings below may reference images not displayed]

FINDINGS: Suboptimal evaluation due to difficulty with patient positioning.
Low lung volumes secondary to poor inspiration. There is no free
air. There are no dilated loops of bowel or air-fluid levels to
suggest bowel obstruction. Overall stool burden appears mild,
greater on the right. Increased density of colon contents could
reflect some retained contrast material.
IMPRESSION: No evidence of bowel obstruction. Overall mild stool burden, greater
on the right.

## 2018-11-20 MED ORDER — MAGNESIUM CITRATE PO SOLN: 1.0000 | ORAL | Status: AC

## 2018-11-20 MED ORDER — DIAZEPAM 5 MG PO TABS
7.5000 mg | ORAL_TABLET | Freq: Every day | ORAL | Status: DC
  Administered 2018-11-23 – 2018-11-27 (×5): 7.5 mg via ORAL
  Filled 2018-11-20 (×6): qty 2

## 2018-11-20 MED ORDER — CITALOPRAM HYDROBROMIDE 20 MG PO TABS
40.0000 mg | ORAL_TABLET | Freq: Every day | ORAL | Status: DC
  Administered 2018-11-23 – 2018-11-27 (×5): 40 mg via ORAL
  Filled 2018-11-20: qty 2
  Filled 2018-11-20: qty 4
  Filled 2018-11-20 (×5): qty 2
  Filled 2018-11-20: qty 4

## 2018-11-20 MED ORDER — LORAZEPAM 2 MG/ML IJ SOLN
2.0000 mg | Freq: Four times a day (QID) | INTRAMUSCULAR | Status: DC | PRN
  Administered 2018-11-21 – 2018-11-26 (×7): 2 mg via INTRAVENOUS
  Filled 2018-11-20 (×9): qty 1

## 2018-11-20 MED ORDER — CLOBAZAM 10 MG PO TABS
60.0000 mg | ORAL_TABLET | Freq: Two times a day (BID) | ORAL | Status: DC
  Administered 2018-11-22 – 2018-11-27 (×9): 60 mg via ORAL
  Filled 2018-11-20 (×10): qty 6

## 2018-11-20 MED ORDER — SODIUM CHLORIDE 0.9 % IV SOLN
100.0000 mg | Freq: Two times a day (BID) | INTRAVENOUS | Status: DC
  Administered 2018-11-21 – 2018-11-26 (×13): 100 mg via INTRAVENOUS
  Filled 2018-11-20 (×18): qty 10

## 2018-11-20 MED ORDER — MAGNESIUM CITRATE PO SOLN
1.0000 | ORAL | Status: AC
  Filled 2018-11-20: qty 296

## 2018-11-20 MED ORDER — SODIUM CHLORIDE 0.9 % IV SOLN
INTRAVENOUS | Status: DC
  Administered 2018-11-20: 23:00:00 via INTRAVENOUS

## 2018-11-20 MED ORDER — BISACODYL 5 MG PO TBEC
10.0000 mg | DELAYED_RELEASE_TABLET | Freq: Two times a day (BID) | ORAL | Status: DC
  Administered 2018-11-23 – 2018-11-24 (×2): 10 mg via ORAL
  Filled 2018-11-20 (×7): qty 2

## 2018-11-20 MED ORDER — MIDAZOLAM 5 MG/0.1ML NA SOLN
5.0000 mg | Freq: Every day | NASAL | Status: DC | PRN
  Administered 2018-11-20: 5 mg via NASAL

## 2018-11-20 MED ORDER — LACOSAMIDE 50 MG PO TABS: 100.0000 mg | ORAL_TABLET | Freq: Two times a day (BID) | ORAL | Status: DC

## 2018-11-20 MED ORDER — POLYETHYLENE GLYCOL 3350 17 G PO PACK
17.0000 g | PACK | Freq: Two times a day (BID) | ORAL | Status: DC
  Administered 2018-11-23 – 2018-11-25 (×4): 17 g via ORAL
  Filled 2018-11-20 (×7): qty 1

## 2018-11-20 MED ORDER — DIAZEPAM 10 MG RE GEL
20.0000 mg | Freq: Every day | RECTAL | Status: DC | PRN
  Filled 2018-11-20: qty 20

## 2018-11-20 NOTE — ED Notes (Signed)
Awaiting medication from pharmacy.

## 2018-11-20 NOTE — Telephone Encounter (Signed)
Pt's mom calling this morning states pt has been having blood in rectal area and requests to speak with a nurse asap.

## 2018-11-20 NOTE — Telephone Encounter (Signed)
Pts mother called regarding this msg and would pt to seen today.

## 2018-11-20 NOTE — H&P (Signed)
History and Physical    Aaron Vega URK:[id number] DOB: Jun 17, 1995 DOA: 11/20/2018  [name] Patient coming from: Home  Chief Complaint: Constipation for 4 weeks or more  HPI: Aaron Vega is a 23 y.o. male with medical history significant of severe autism nonverbal with autism admitted with complaints of no bowel movements for the last 4 weeks.  Patient has been vomiting some of which have been induced by him by putting his finger in his mouth and vomiting.  He has been just drinking liquids 7-Up's ginger ale etc. but not been eating anything at home has lost weight mother does not know how much weight he has lost.  History is obtained from the patient's mother since patient is nonverbal.  Patient was admitted to [facility] 11/15/2018 CT scan showed constipation. Mother has tried Dulcolax, MiraLAX at home without any effect.  She denies any sick contacts.  She denies cough.  ED Course: Blood pressure 1 4698 pulse is 128 respiration 20 sats 98% on room air.  Review of Systems: As per HPI otherwise all other systems reviewed and are negative  Ambulatory Status: Ambulatory at baseline.  Past Medical History:  Diagnosis Date  . Autistic disorder, current or active state   . Fatty liver   . Hypertension   . Obesity   . Pancreatitis    at age 16 years  . Pre-diabetes   . Seizures (HCC)    as of 11/23/15 - no seizures for 3 month  . Sleep apnea    not on cpap (can't tolerate)  . Tics of organic origin   . Tourette syndrome     Past Surgical History:  Procedure Laterality Date  . BIOPSY  11/08/2018   Procedure: BIOPSY;  Surgeon: Beverley Fiedler, MD;  Location: [facility];  Service: Gastroenterology;;  . ESOPHAGOGASTRODUODENOSCOPY (EGD) WITH PROPOFOL N/A 11/08/2018   Procedure: ESOPHAGOGASTRODUODENOSCOPY (EGD) WITH PROPOFOL;  Surgeon: Beverley Fiedler, MD;  Location: [facility];  Service: Gastroenterology;  Laterality: N/A;  . RADIOLOGY WITH  ANESTHESIA N/A 11/24/2015   Procedure: CT SCAN ABDOMEN AND PELVIS WITH CONTRAST;  Surgeon: Medication Radiologist, MD;  Location: [facility];  Service: Radiology;  Laterality: N/A;  . TONSILLECTOMY     and adenoidectomy    Social History   Socioeconomic History  . Marital status: Single    Spouse name: Not on file  . Number of children: Not on file  . Years of education: Not on file  . Highest education level: Not on file  Occupational History  . Not on file  Social Needs  . Financial resource strain: Not on file  . Food insecurity    Worry: Not on file    Inability: Not on file  . Transportation needs    Medical: Not on file    Non-medical: Not on file  Tobacco Use  . Smoking status: Never Smoker  . Smokeless tobacco: Never Used  Substance and Sexual Activity  . Alcohol use: Never    Frequency: Never  . Drug use: Never  . Sexual activity: Not on file  Lifestyle  . Physical activity    Days per week: Not on file    Minutes per session: Not on file  . Stress: Not on file  Relationships  . Social Musician on phone: Not on file    Gets together: Not on file    Attends religious service: Not on file    Active member of club or organization: Not  on file    Attends meetings of clubs or organizations: Not on file    Relationship status: Not on file  . Intimate partner violence    Fear of current or ex partner: Not on file    Emotionally abused: Not on file    Physically abused: Not on file    Forced sexual activity: Not on file  Other Topics Concern  . Not on file  Social History Narrative  . Not on file    No Known Allergies  Family History  Problem Relation Age of Onset  . Seizures Maternal Aunt   . Anxiety disorder Paternal Uncle   . Depression Paternal Uncle   . Bipolar disorder Paternal Uncle   . Seizures Cousin        Maternal 1st and 2nd cousins have seizures  . Anxiety disorder Other        Paternal fhx  . Depression Other        Paternal  fhx  . Hypertension Other        Paternal fhx  . Bipolar disorder Other        Paternal fhx    Prior to Admission medications   Medication Sig Start Date End Date Taking? Authorizing Provider  acetaminophen (TYLENOL) 650 MG suppository Place 650 mg rectally every 6 (six) hours as needed. 11/05/18  Yes [provider]  citalopram (CELEXA) 40 MG tablet Take 40 mg by mouth daily. 04/24/14  Yes [provider]  cloBAZam (ONFI) 20 MG tablet Take 60 mg by mouth 2 (two) times daily. 10/16/18  Yes [provider]  diazepam (DIASAT) 20 MG GEL Place 20 mg rectally daily as needed (for seizures lasting more than 5 minutes.).  07/03/13  Yes [provider]  diazepam (VALIUM) 5 MG tablet Take 7.5 mg by mouth every morning. May give 0.5 tablet (2.5mg ) as needed for agitation/anxiety 10/15/18  Yes [provider]  docusate sodium (COLACE) 100 MG capsule Take 100 mg by mouth 2 (two) times daily. 11/15/18 11/25/18 Yes [provider]  lisinopril (PRINIVIL,ZESTRIL) 20 MG tablet Take 20 mg by mouth daily. Take with 5mg  tablet for a total dose of 25mg  04/15/14  Yes [provider]  lisinopril (ZESTRIL) 5 MG tablet Take 5 mg by mouth daily. Take with 20mg  tablet for a total dose of 25mg  10/14/18  Yes [provider]  Melatonin 5 MG TABS Take 5 mg by mouth at bedtime.   Yes [provider]  Midazolam (NAYZILAM) 5 MG/0.1ML SOLN Place 5 mg into the nose daily as needed (seizure).   Yes [provider]  nystatin (MYCOSTATIN/NYSTOP) 100000 UNIT/GM POWD Apply 1 application topically daily. Neck, crevices, etc. 04/23/14  Yes [provider]  omeprazole (FIRST-OMEPRAZOLE) 2 mg/mL SUSP oral suspension Take 20 mLs (40 mg total) by mouth daily before breakfast. 11/08/18  Yes Dhungel, Nishant, MD  ondansetron (ZOFRAN ODT) 4 MG disintegrating tablet Take 1 tablet (4 mg total) by mouth every 8 (eight) hours as needed for nausea or vomiting.  11/12/18  Yes Carmon Sails J, PA-C  polyethylene glycol (MIRALAX) 17 g packet Take 17 g by mouth daily. 11/08/18  Yes Dhungel, Nishant, MD  VIMPAT 10 MG/ML oral solution Take 10 mLs by mouth 2 (two) times daily. 10/28/18  Yes [provider]  ondansetron (ZOFRAN) 4 MG tablet Take 1 tablet (4 mg total) by mouth every 8 (eight) hours as needed for nausea or vomiting. Patient not taking: Reported on 11/20/2018 11/08/18 11/08/19  Dhungel, Nishant, MD  promethazine (PHENERGAN) 25 MG suppository Place 1 suppository (25 mg total) rectally every 6 (six) hours as needed for nausea or vomiting. 11/12/18   Liberty HandyGibbons, Claudia J, PA-C    Physical Exam: Vitals:   11/20/18 1154 11/20/18 1454  BP: 133/66 (!) 146/98  Pulse: (!) 124 (!) 128  Resp: 19 20  Temp: 98.6 F (37 C)   TempSrc: Oral   SpO2: 99% 98%     . General:  Appears in mild distress . Eyes:  PERRL, EOMI, normal lids, iris . ENT:  grossly normal hearing, lips & tongue, mmm . Neck:no LAD, masses or thyromegaly . Cardiovascular:  RRR, no m/r/g. . Respiratory:  CTA bilaterally, no w/r/r. Normal respiratory effort. . Abdomen: soft, distended NABS . Skin: no rash or induration seen on limited exam . Musculoskeletal:  grossly normal tone BUE/BLE, good ROM, no bony abnormality   Labs on Admission: I have personally reviewed following labs and imaging studies  CBC: Recent Labs  Lab 11/20/18 1436  WBC 8.2  NEUTROABS 5.3  HGB 14.1  HCT 43.1  MCV 85.0  PLT 389   Basic Metabolic Panel: Recent Labs  Lab 11/20/18 1436  NA 137  K 3.9  CL 99  CO2 25  GLUCOSE 109*  BUN 9  CREATININE 0.73  CALCIUM 9.7   GFR: Estimated Creatinine Clearance: 210.5 mL/min (by C-G formula based on SCr of 0.73 mg/dL). Liver Function Tests: Recent Labs  Lab 11/20/18 1436  AST 75*  ALT 112*  ALKPHOS 78  BILITOT 1.4*  PROT 7.8  ALBUMIN 4.6   No results for input(s): LIPASE, AMYLASE in the last 168 hours. No results for input(s):  AMMONIA in the last 168 hours. Coagulation Profile: No results for input(s): INR, PROTIME in the last 168 hours. Cardiac Enzymes: No results for input(s): CKTOTAL, CKMB, CKMBINDEX, TROPONINI in the last 168 hours. BNP (last 3 results) No results for input(s): PROBNP in the last 8760 hours. HbA1C: No results for input(s): HGBA1C in the last 72 hours. CBG: No results for input(s): GLUCAP in the last 168 hours. Lipid Profile: No results for input(s): CHOL, HDL, LDLCALC, TRIG, CHOLHDL, LDLDIRECT in the last 72 hours. Thyroid Function Tests: No results for input(s): TSH, T4TOTAL, FREET4, T3FREE, THYROIDAB in the last 72 hours. Anemia Panel: No results for input(s): VITAMINB12, FOLATE, FERRITIN, TIBC, IRON, RETICCTPCT in the last 72 hours. Urine analysis:    Component Value Date/Time   COLORURINE AMBER (A) 11/07/2018 1530   APPEARANCEUR CLOUDY (A) 11/07/2018 1530   LABSPEC 1.027 11/07/2018 1530   PHURINE 5.0 11/07/2018 1530   GLUCOSEU NEGATIVE 11/07/2018 1530   HGBUR NEGATIVE 11/07/2018 1530   BILIRUBINUR SMALL (A) 11/07/2018 1530   KETONESUR NEGATIVE 11/07/2018 1530   PROTEINUR 30 (A) 11/07/2018 1530   NITRITE NEGATIVE 11/07/2018 1530   LEUKOCYTESUR NEGATIVE 11/07/2018 1530    Creatinine Clearance: Estimated Creatinine Clearance: 210.5 mL/min (by C-G formula based on SCr of 0.73 mg/dL).  Sepsis Labs: @LABRCNTIP (procalcitonin:4,lacticidven:4) )No results found for this or any previous visit (from the past 240 hour(s)).   Radiological Exams on Admission: Dg Abdomen Acute W/chest  Result Date: 11/20/2018 CLINICAL DATA:  Constipation, pain, vomiting EXAM: DG ABDOMEN ACUTE W/ 1V CHEST COMPARISON:  October 05, 2018 FINDINGS: Suboptimal evaluation due to difficulty with patient positioning. Low lung volumes secondary to poor inspiration. There is no free air. There are no dilated loops of bowel or air-fluid levels to suggest bowel obstruction. Overall stool burden appears mild,  greater  on the right. Increased density of colon contents could reflect some retained contrast material. IMPRESSION: No evidence of bowel obstruction. Overall mild stool burden, greater on the right. Electronically Signed   By: Guadlupe Spanish M.D.   On: 11/20/2018 16:09     Assessment/Plan Active Problems:   * No active hospital problems. *    #1 impacted stool/constipation for over 4 weeks.  Will order mag citrate.  I hope you will drink that.  GI has ordered Dulcolax and MiraLAX and tap water enema.  Plan is for flex sig tomorrow to remove stool.  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  Patient is on Onfi, and Zofran.  I have put him on Ativan as needed just in case if he does not take midazolam or rectally stat.  #3 hypertension he takes lisinopril at home.  #4 depression anxiety continue Celexa  #5 mild hyperglycemia check TSH with the next blood draw  #6 elevated LFTs?  Related to seizure meds follow closely as an outpatient.  Severity of Illness: The appropriate patient status for this patient is INPATIENT. Inpatient status is judged to be reasonable and necessary in order to provide the required intensity of service to ensure the patient's safety. The patient's presenting symptoms, physical exam findings, and initial radiographic and laboratory data in the context of their chronic comorbidities is felt to place them at high risk for further clinical deterioration. Furthermore, it is not anticipated that the patient will be medically stable for discharge from the hospital within 2 midnights of admission. The following factors support the patient status of inpatient.   " The patient's presenting symptoms include nausea vomiting and constipation. " The worrisome physical exam findings include abdominal distention and constipation " The initial  radiographic and laboratory data are worrisome because of infection " The chronic co-morbidities include severe autism nonverbal epilepsy bipolar obesity   * I certify that at the point of admission it is my clinical judgment that the patient will require inpatient hospital care spanning beyond 2 midnights from the point of admission due to high intensity of service, high risk for further deterioration and high frequency of surveillance required.*  Estimated body mass index is 48.44 kg/m as calculated from the following:   Height as of 11/08/18:  (1.753 m).   Weight as of 11/08/18: 148.8 kg.   DVT prophylaxis: None due to patient's severe autism anxiety bipolar and agitation Code Status: Full code Family Communication: Discussed with mother Disposition Plan: Pending clinical improvement Consults called: GI Admission status: Observation   Alwyn Ren MD [facility]  If 7PM-7AM, please contact night-coverage [email or web address] Password [email or web address]  11/20/2018, 5:40 PM

## 2018-11-20 NOTE — H&P (View-Only) (Signed)
Consultation  Referring Provider: Dr.Dykstra     Primary Care Physician:  [facility] Primary Gastroenterologist:    Dr. Chales Abrahams     Reason for Consultation: Constipation, question impaction            HPI:   Aaron Vega is a 23 y.o. male with a past medical history of severe autism, mental retardation, seizure disorder and obesity, who is unable to verbally communicate, who returns to the ER today with his mother for complaints of constipation and possible impaction.    Recent consult note for the same on 11/12/2018.  Please see that for further detail.  Essentially patient was admitted 10/14-10/15 for nausea and vomiting and had 2 CT scans at outside facility which were unremarkable, lipase normal, mild elevation of liver tests and patient had a EGD that admission with LA grade B reflux/vomiting related to esophagitis, nodular mucosa in the esophagus and otherwise normal.  There is no clear cause for nausea and vomiting is thought constipation to be the main culprit.  It is recommended yet undergo bowel prep while in the hospital patient became agitated on the second day and family thought best to take him home.    At time of last consult 11/12/2018 patient presented to the ER and mom was frustrated.  He only had a small ball of stool over the past 4 weeks and mom explained that he "was hitting his stomach" and look like he cannot have a good bowel movement.  She was trying stool softeners etc.  At that time discussed admission with disimpaction in the Endo lab, but as patient became very irritated during last hospitalization plan was to give him movie prep and send them home.  At time of discharge patient was supposed to have an appointment with Dr. Chales Abrahams on 11/15/2018.  This was rescheduled by mom to 11/22/2018.    Today, the patient's mother tells me that he has continued with constipation, only a very small ball of stool over the past 4 to 5 weeks now.  She could  not give him the bowel prep because he just would not drink it.  She has been unsuccessful with laxatives at home.  Explains that he has nausea and vomiting with anything that he eats and tends to grab at his stomach like he is uncomfortable.  Now he is growing "weaker and weaker" in fact she tells me he is unable to make it to the bathroom by himself and she has to help him walk.  Tells me that she cannot bring him home like this.  Asking for something to sedate him.  Tells me that he has not been eating anything because he just vomits it.    Denies fever or chills.    Past Medical History:  Diagnosis Date  . Autistic disorder, current or active state   . Fatty liver   . Hypertension   . Obesity   . Pancreatitis    at age 69 years  . Pre-diabetes   . Seizures (HCC)    as of 11/23/15 - no seizures for 3 month  . Sleep apnea    not on cpap (can't tolerate)  . Tics of organic origin   . Tourette syndrome     Past Surgical History:  Procedure Laterality Date  . BIOPSY  11/08/2018   Procedure: BIOPSY;  Surgeon: Beverley Fiedler, MD;  Location: [facility];  Service: Gastroenterology;;  . ESOPHAGOGASTRODUODENOSCOPY (EGD) WITH PROPOFOL N/A 11/08/2018  Procedure: ESOPHAGOGASTRODUODENOSCOPY (EGD) WITH PROPOFOL;  Surgeon: Beverley FiedlerPyrtle, Jay M, MD;  Location: [facility];  Service: Gastroenterology;  Laterality: N/A;  . RADIOLOGY WITH ANESTHESIA N/A 11/24/2015   Procedure: CT SCAN ABDOMEN AND PELVIS WITH CONTRAST;  Surgeon: Medication Radiologist, MD;  Location: [facility] OR;  Service: Radiology;  Laterality: N/A;  . TONSILLECTOMY     and adenoidectomy    Family History  Problem Relation Age of Onset  . Seizures Maternal Aunt   . Anxiety disorder Paternal Uncle   . Depression Paternal Uncle   . Bipolar disorder Paternal Uncle   . Seizures Cousin        Maternal 1st and 2nd cousins have seizures  . Anxiety disorder Other        Paternal fhx  . Depression Other        Paternal fhx  . Hypertension  Other        Paternal fhx  . Bipolar disorder Other        Paternal fhx    Social History   Tobacco Use  . Smoking status: Never Smoker  . Smokeless tobacco: Never Used  Substance Use Topics  . Alcohol use: Never    Frequency: Never  . Drug use: Never    Prior to Admission medications   Medication Sig Start Date End Date Taking? Authorizing Provider  acetaminophen (TYLENOL) 650 MG suppository Place 650 mg rectally every 6 (six) hours as needed. 11/05/18  Yes [provider]  citalopram (CELEXA) 40 MG tablet Take 40 mg by mouth daily. 04/24/14  Yes [provider]  cloBAZam (ONFI) 20 MG tablet Take 60 mg by mouth 2 (two) times daily. 10/16/18  Yes [provider]  diazepam (DIASAT) 20 MG GEL Place 20 mg rectally daily as needed (for seizures lasting more than 5 minutes.).  07/03/13  Yes [provider]  diazepam (VALIUM) 5 MG tablet Take 7.5 mg by mouth every morning. May give 0.5 tablet (2.5mg ) as needed for agitation/anxiety 10/15/18  Yes [provider]  docusate sodium (COLACE) 100 MG capsule Take 100 mg by mouth 2 (two) times daily. 11/15/18 11/25/18 Yes [provider]  lisinopril (PRINIVIL,ZESTRIL) 20 MG tablet Take 20 mg by mouth daily. Take with 5mg  tablet for a total dose of 25mg  04/15/14  Yes [provider]  lisinopril (ZESTRIL) 5 MG tablet Take 5 mg by mouth daily. Take with 20mg  tablet for a total dose of 25mg  10/14/18  Yes [provider]  Melatonin 5 MG TABS Take 5 mg by mouth at bedtime.   Yes [provider]  Midazolam (NAYZILAM) 5 MG/0.1ML SOLN Place 5 mg into the nose daily as needed (seizure).   Yes [provider]  nystatin (MYCOSTATIN/NYSTOP) 100000 UNIT/GM POWD Apply 1 application topically daily. Neck, crevices, etc. 04/23/14  Yes [provider]  omeprazole (FIRST-OMEPRAZOLE) 2 mg/mL SUSP oral suspension Take 20 mLs (40 mg total) by mouth daily before breakfast. 11/08/18   Yes Dhungel, Nishant, MD  ondansetron (ZOFRAN ODT) 4 MG disintegrating tablet Take 1 tablet (4 mg total) by mouth every 8 (eight) hours as needed for nausea or vomiting. 11/12/18  Yes Sharen HeckGibbons, Claudia J, PA-C  polyethylene glycol (MIRALAX) 17 g packet Take 17 g by mouth daily. 11/08/18  Yes Dhungel, Nishant, MD  VIMPAT 10 MG/ML oral solution Take 10 mLs by mouth 2 (two) times daily. 10/28/18  Yes [provider]  ondansetron (ZOFRAN) 4 MG tablet Take 1 tablet (4 mg total) by mouth every 8 (eight)  hours as needed for nausea or vomiting. Patient not taking: Reported on 11/20/2018 11/08/18 11/08/19  Dhungel, Theda Belfast, MD  promethazine (PHENERGAN) 25 MG suppository Place 1 suppository (25 mg total) rectally every 6 (six) hours as needed for nausea or vomiting. 11/12/18   Liberty Handy, PA-C    No current facility-administered medications for this encounter.    Current Outpatient Medications  Medication Sig Dispense Refill  . acetaminophen (TYLENOL) 650 MG suppository Place 650 mg rectally every 6 (six) hours as needed.    . citalopram (CELEXA) 40 MG tablet Take 40 mg by mouth daily.  4  . cloBAZam (ONFI) 20 MG tablet Take 60 mg by mouth 2 (two) times daily.    . diazepam (DIASAT) 20 MG GEL Place 20 mg rectally daily as needed (for seizures lasting more than 5 minutes.).     Marland Kitchen diazepam (VALIUM) 5 MG tablet Take 7.5 mg by mouth every morning. May give 0.5 tablet (2.5mg ) as needed for agitation/anxiety    . docusate sodium (COLACE) 100 MG capsule Take 100 mg by mouth 2 (two) times daily.    Marland Kitchen lisinopril (PRINIVIL,ZESTRIL) 20 MG tablet Take 20 mg by mouth daily. Take with 5mg  tablet for a total dose of 25mg   9  . lisinopril (ZESTRIL) 5 MG tablet Take 5 mg by mouth daily. Take with 20mg  tablet for a total dose of 25mg     . Melatonin 5 MG TABS Take 5 mg by mouth at bedtime.    . Midazolam (NAYZILAM) 5 MG/0.1ML SOLN Place 5 mg into the nose daily as needed (seizure).    . nystatin  (MYCOSTATIN/NYSTOP) 100000 UNIT/GM POWD Apply 1 application topically daily. Neck, crevices, etc.  2  . omeprazole (FIRST-OMEPRAZOLE) 2 mg/mL SUSP oral suspension Take 20 mLs (40 mg total) by mouth daily before breakfast. 300 mL 0  . ondansetron (ZOFRAN ODT) 4 MG disintegrating tablet Take 1 tablet (4 mg total) by mouth every 8 (eight) hours as needed for nausea or vomiting. 20 tablet 0  . polyethylene glycol (MIRALAX) 17 g packet Take 17 g by mouth daily. 14 each 0  . VIMPAT 10 MG/ML oral solution Take 10 mLs by mouth 2 (two) times daily.    . ondansetron (ZOFRAN) 4 MG tablet Take 1 tablet (4 mg total) by mouth every 8 (eight) hours as needed for nausea or vomiting. (Patient not taking: Reported on 11/20/2018) 30 tablet 0  . promethazine (PHENERGAN) 25 MG suppository Place 1 suppository (25 mg total) rectally every 6 (six) hours as needed for nausea or vomiting. 12 each 0    Allergies as of 11/20/2018  . (No Known Allergies)     Review of Systems:  (answered by mom)  Constitutional: No weight loss, fever or chills Skin: No rash  Cardiovascular: No chest pain  Respiratory: No SOB  Gastrointestinal: See HPI and otherwise negative Genitourinary: No dysuria Neurological: No headache, dizziness or syncope Musculoskeletal: No new muscle or joint pain Hematologic: No bleeding  Psychiatric: No history of depression or anxiety    Physical Exam:  Vital signs in last 24 hours: Temp:  [98.6 F (37 C)] 98.6 F (37 C) (10/27 1154) Pulse Rate:  [124-128] 128 (10/27 1454) Resp:  [19-20] 20 (10/27 1454) BP: (133-146)/(66-98) 146/98 (10/27 1454) SpO2:  [98 %-99 %] 98 % (10/27 1454)   General:   Pleasant obese Caucasian male appears to be in NAD, Well developed, Well nourished, alert and cooperative +pulling at IV tape Head:  Normocephalic and atraumatic. Eyes:  PEERL, EOMI. No icterus.  Ears:  Normal auditory acuity. Neck:  Supple Throat: Oral cavity and pharynx without inflammation,  swelling or lesion.  Lungs: Respirations even and unlabored. Lungs clear to auscultation bilaterally.   No wheezes, crackles, or rhonchi.  Heart: Normal S1, S2. No MRG. Regular rate and rhythm. No peripheral edema, cyanosis or pallor.  Abdomen:  Soft, nondistended, nontender. No rebound or guarding. Decreased bs all 4 quadrants. No appreciable masses or hepatomegaly.(limited as patient would not lay down) Rectal:  Not performed.  Msk:  Symmetrical without gross deformities. Peripheral pulses intact.  Extremities:  Without edema, no deformity or joint abnormality.  Neurologic:  Alert and  oriented x4;  grossly normal neurologically.  Skin:   Dry and intact without significant lesions or rashes. Psychiatric: unable to communicate verbally   LAB RESULTS: Recent Labs    11/20/18 1436  WBC 8.2  HGB 14.1  HCT 43.1  PLT 389   STUDIES: Dg Abdomen Acute W/chest  Result Date: 11/20/2018 CLINICAL DATA:  Constipation, pain, vomiting EXAM: DG ABDOMEN ACUTE W/ 1V CHEST COMPARISON:  October 05, 2018 FINDINGS: Suboptimal evaluation due to difficulty with patient positioning. Low lung volumes secondary to poor inspiration. There is no free air. There are no dilated loops of bowel or air-fluid levels to suggest bowel obstruction. Overall stool burden appears mild, greater on the right. Increased density of colon contents could reflect some retained contrast material. IMPRESSION: No evidence of bowel obstruction. Overall mild stool burden, greater on the right. Electronically Signed   By: Macy Mis M.D.   On: 11/20/2018 16:09     Impression / Plan:   Impression: 1.  Constipation: 23 year old male with severe autism, no bowel movement in the past 5 weeks per mother, she has tried multiple laxatives at home, he would not drink a bowel prep, has only had one small marble sized stool over those weeks per mom, constant nausea with vomiting, now not eating and becoming weaker per mom   Plan: 1.   Patient needs to be admitted to the hospital team.  We will try to give him some prep and/or stool softeners and/or MiraLAX and/or tapwater enema as overnight, whatever he will let us do. 2.  Patient will be scheduled for a flex sigmoidoscopy tomorrow for removal of whatever stool we can get out.  Did explain this to mom.  She is aware it would not be a "clean whistle" cleanout. 3.  Ordered MiraLAX 17 g x 2 4.  Ordered bisacodyl 10 mg x 2 5.  Ordered tapwater enema x2. 6.  Patient will be on clear liquid diet and n.p.o. after midnight. 7. Added am TSH and cortisol 8.  Please await any further recommendations from Dr. Rush Landmark later today  Thank you for your kind consultation, we will continue to follow.  Lavone Nian Zyniah Ferraiolo  11/20/2018, 4:25 PM

## 2018-11-20 NOTE — ED Triage Notes (Signed)
Mother reports that patient been constipated for 4 weeks after trying many different medications. Reports that was told by PCP to come to ED for sedation and fecal disimpaction.

## 2018-11-20 NOTE — ED Provider Notes (Signed)
Aaron Vega Provider Note   CSN: [id number] Arrival date & time: 11/20/18  1141     History   Chief Complaint Chief Complaint  Patient presents with  . Constipation    HPI Aaron Vega is a 23 y.o. male.  Autistic, nonverbal, history obtained from mother.  Per mother, for the past 4 weeks patient has been struggling with severe constipation.  He has been to the emergency department multiple times for this, as well as evaluation by [facility].   Patient was [facility], discharged on 1022.  [facility].  Patient has continued to have vomiting episodes, not passing stools.    HPI  Past Medical History:  Diagnosis Date  . Autistic disorder, current or active state   . Fatty liver   . Hypertension   . Obesity   . Pancreatitis    at age 54 years  . Pre-diabetes   . Seizures (HCC)    as of 11/23/15 - no seizures for 3 month  . Sleep apnea    not on cpap (can't tolerate)  . Tics of organic origin   . Tourette syndrome     Patient Active Problem List   Diagnosis Date Noted  . Reflux esophagitis 11/08/2018  . Constipation in male 11/08/2018  . Vomiting in adult   . Intractable nausea and vomiting 11/07/2018  . Obesity (BMI 30-39.9) 11/07/2018  . Seizure disorder (HCC) 11/07/2018  . Dehydration   . Tics of organic origin 09/12/2012  . Autistic disorder 09/12/2012    Past Surgical History:  Procedure Laterality Date  . BIOPSY  11/08/2018   Procedure: BIOPSY;  Surgeon: Beverley FiedlerPyrtle, Jay M, MD;  Location: [facility];  Service: Gastroenterology;;  . ESOPHAGOGASTRODUODENOSCOPY (EGD) WITH PROPOFOL N/A 11/08/2018   Procedure: ESOPHAGOGASTRODUODENOSCOPY (EGD) WITH PROPOFOL;  Surgeon: Beverley FiedlerPyrtle, Jay M, MD;  Location: [facility];  Service: Gastroenterology;  Laterality: N/A;  . RADIOLOGY WITH ANESTHESIA N/A 11/24/2015   Procedure: CT SCAN ABDOMEN AND PELVIS WITH CONTRAST;  Surgeon:  Medication Radiologist, MD;  Location: [facility] OR;  Service: Radiology;  Laterality: N/A;  . TONSILLECTOMY     and adenoidectomy        Home Medications    Prior to Admission medications   Medication Sig Start Date End Date Taking? Authorizing Provider  acetaminophen (TYLENOL) 650 MG suppository Place 650 mg rectally every 6 (six) hours as needed. 11/05/18  Yes [provider]  citalopram (CELEXA) 40 MG tablet Take 40 mg by mouth daily. 04/24/14  Yes [provider]  cloBAZam (ONFI) 20 MG tablet Take 60 mg by mouth 2 (two) times daily. 10/16/18  Yes [provider]  diazepam (DIASAT) 20 MG GEL Place 20 mg rectally daily as needed (for seizures lasting more than 5 minutes.).  07/03/13  Yes [provider]  diazepam (VALIUM) 5 MG tablet Take 7.5 mg by mouth every morning. May give 0.5 tablet (2.5mg ) as needed for agitation/anxiety 10/15/18  Yes [provider]  docusate sodium (COLACE) 100 MG capsule Take 100 mg by mouth 2 (two) times daily. 11/15/18 11/25/18 Yes [provider]  lisinopril (PRINIVIL,ZESTRIL) 20 MG tablet Take 20 mg by mouth daily. Take with 5mg  tablet for a total dose of 25mg  04/15/14  Yes [provider]  lisinopril (ZESTRIL) 5 MG tablet Take 5 mg by mouth daily. Take with 20mg  tablet for a total dose of 25mg  10/14/18  Yes [provider]  Melatonin 5  MG TABS Take 5 mg by mouth at bedtime.   Yes [provider]  Midazolam (NAYZILAM) 5 MG/0.1ML SOLN Place 5 mg into the nose daily as needed (seizure).   Yes [provider]  nystatin (MYCOSTATIN/NYSTOP) 100000 UNIT/GM POWD Apply 1 application topically daily. Neck, crevices, etc. 04/23/14  Yes [provider]  omeprazole (FIRST-OMEPRAZOLE) 2 mg/mL SUSP oral suspension Take 20 mLs (40 mg total) by mouth daily before breakfast. 11/08/18  Yes Dhungel, Nishant, MD  ondansetron (ZOFRAN ODT) 4 MG disintegrating tablet Take 1 tablet (4 mg total) by  mouth every 8 (eight) hours as needed for nausea or vomiting. 11/12/18  Yes Sharen Heck J, PA-C  polyethylene glycol (MIRALAX) 17 g packet Take 17 g by mouth daily. 11/08/18  Yes Dhungel, Nishant, MD  VIMPAT 10 MG/ML oral solution Take 10 mLs by mouth 2 (two) times daily. 10/28/18  Yes [provider]  ondansetron (ZOFRAN) 4 MG tablet Take 1 tablet (4 mg total) by mouth every 8 (eight) hours as needed for nausea or vomiting. Patient not taking: Reported on 11/20/2018 11/08/18 11/08/19  Dhungel, Theda Belfast, MD  promethazine (PHENERGAN) 25 MG suppository Place 1 suppository (25 mg total) rectally every 6 (six) hours as needed for nausea or vomiting. 11/12/18   Liberty Handy, PA-C    Family History Family History  Problem Relation Age of Onset  . Seizures Maternal Aunt   . Anxiety disorder Paternal Uncle   . Depression Paternal Uncle   . Bipolar disorder Paternal Uncle   . Seizures Cousin        Maternal 1st and 2nd cousins have seizures  . Anxiety disorder Other        Paternal fhx  . Depression Other        Paternal fhx  . Hypertension Other        Paternal fhx  . Bipolar disorder Other        Paternal fhx    Social History Social History   Tobacco Use  . Smoking status: Never Smoker  . Smokeless tobacco: Never Used  Substance Use Topics  . Alcohol use: Never    Frequency: Never  . Drug use: Never     Allergies   Patient has no known allergies.   Review of Systems Review of Systems  Unable to perform ROS: Patient nonverbal  Gastrointestinal: Positive for vomiting.     Physical Exam Updated Vital Signs BP (!) 146/98 (BP Location: Right Arm)   Pulse (!) 128   Temp 98.6 F (37 C) (Oral)   Resp 20   SpO2 98%   Physical Exam Vitals signs and nursing note reviewed.  Constitutional:      Appearance: He is well-developed.  HENT:     Head: Normocephalic and atraumatic.  Eyes:     Conjunctiva/sclera: Conjunctivae normal.  Neck:      Musculoskeletal: Neck supple.  Cardiovascular:     Rate and Rhythm: Normal rate and regular rhythm.     Heart sounds: No murmur.  Pulmonary:     Effort: Pulmonary effort is normal. No respiratory distress.     Breath sounds: Normal breath sounds.  Abdominal:     Palpations: Abdomen is soft.     Tenderness: There is no abdominal tenderness.  Skin:    General: Skin is warm and dry.     Capillary Refill: Capillary refill takes less than 2 seconds.  Neurological:     Mental Status: He is alert.      ED  Treatments / Results  Labs (all labs ordered are listed, but only abnormal results are displayed) Labs Reviewed  COMPREHENSIVE METABOLIC PANEL - Abnormal; Notable for the following components:      Result Value   Glucose, Bld 109 (*)    AST 75 (*)    ALT 112 (*)    Total Bilirubin 1.4 (*)    All other components within normal limits  SARS CORONAVIRUS 2 (TAT 6-24 HRS)  CBC WITH DIFFERENTIAL/PLATELET  CORTISOL-AM, BLOOD    EKG None  Radiology Dg Abdomen Acute W/chest  Result Date: 11/20/2018 CLINICAL DATA:  Constipation, pain, vomiting EXAM: DG ABDOMEN ACUTE W/ 1V CHEST COMPARISON:  October 05, 2018 FINDINGS: Suboptimal evaluation due to difficulty with patient positioning. Low lung volumes secondary to poor inspiration. There is no free air. There are no dilated loops of bowel or air-fluid levels to suggest bowel obstruction. Overall stool burden appears mild, greater on the right. Increased density of colon contents could reflect some retained contrast material. IMPRESSION: No evidence of bowel obstruction. Overall mild stool burden, greater on the right. Electronically Signed   By: Macy Mis M.D.   On: 11/20/2018 16:09    Procedures Procedures (including critical care time)  Medications Ordered in ED Medications  bisacodyl (DULCOLAX) EC tablet 10 mg (has no administration in time range)  polyethylene glycol (MIRALAX / GLYCOLAX) packet 17 g (has no administration  in time range)     Initial Impression / Assessment and Plan / ED Course  I have reviewed the triage vital signs and the nursing notes.  Pertinent labs & imaging results that were available during my care of the patient were reviewed by me and considered in my medical decision making (see chart for details).  Clinical Course as of Nov 20 1722  Tue Nov 20, 2018  1610 Rechecked patient, discussed with mother, will discuss again with [facility]   [RD]  1613 Discussed with Fabio Asa, she will evaluate   [RD]    Clinical Course User Index [RD] Lucrezia Starch, MD      23 year old autistic, nonverbal patient presents to ER with significant constipation, this has been ongoing for the past month, discussed case with GI who recommends patient have formal disimpaction, bowel cleanout.  Patient's not been able to tolerate prior attempts at bowel prep, suppositories, enemas.  Will admit to medicine for bowel prep, likely cleanout tomorrow with GI in a.m.  Discussed case with Dr. Ignatius Specking who will evaluate patient from [place].  Discussed case with hospitalist who will admit.  Dr. Zigmund Daniel to accept.  Final Clinical Impressions(s) / ED Diagnoses   Final diagnoses:  Abdominal pain  Constipation, unspecified constipation type    ED Discharge Orders    None       Lucrezia Starch, MD 11/20/18 1724

## 2018-11-20 NOTE — ED Notes (Signed)
Report called to [facility], [place].  Mother remains at bedside.  Saline lock to rt ACF in place.  Pending transfer to floor.

## 2018-11-20 NOTE — ED Notes (Signed)
Pt awake, alert & responsive, no distress noted, calm at present.  Mother at bedside with pt, pending report & transfer to rm 1514.  Monitoring for safety.

## 2018-11-20 NOTE — ED Notes (Signed)
COVID swab walked to lab by NT

## 2018-11-20 NOTE — Consult Note (Addendum)
   Consultation  Referring Provider: Dr.Dykstra     Primary Care Physician:  [facility] Primary Gastroenterologist:    Dr. Gupta     Reason for Consultation: Constipation, question impaction            HPI:   Aaron Vega is a 23 y.o. male with a past medical history of severe autism, mental retardation, seizure disorder and obesity, who is unable to verbally communicate, who returns to the ER today with his mother for complaints of constipation and possible impaction.    Recent consult note for the same on 11/12/2018.  Please see that for further detail.  Essentially patient was admitted 10/14-10/15 for nausea and vomiting and had 2 CT scans at outside facility which were unremarkable, lipase normal, mild elevation of liver tests and patient had a EGD that admission with LA grade B reflux/vomiting related to esophagitis, nodular mucosa in the esophagus and otherwise normal.  There is no clear cause for nausea and vomiting is thought constipation to be the main culprit.  It is recommended yet undergo bowel prep while in the hospital patient became agitated on the second day and family thought best to take him home.    At time of last consult 11/12/2018 patient presented to the ER and mom was frustrated.  He only had a small ball of stool over the past 4 weeks and mom explained that he "was hitting his stomach" and look like he cannot have a good bowel movement.  She was trying stool softeners etc.  At that time discussed admission with disimpaction in the Endo lab, but as patient became very irritated during last hospitalization plan was to give him movie prep and send them home.  At time of discharge patient was supposed to have an appointment with Dr. Gupta on 11/15/2018.  This was rescheduled by mom to 11/22/2018.    Today, the patient's mother tells me that he has continued with constipation, only a very small ball of stool over the past 4 to 5 weeks now.  She could  not give him the bowel prep because he just would not drink it.  She has been unsuccessful with laxatives at home.  Explains that he has nausea and vomiting with anything that he eats and tends to grab at his stomach like he is uncomfortable.  Now he is growing "weaker and weaker" in fact she tells me he is unable to make it to the bathroom by himself and she has to help him walk.  Tells me that she cannot bring him home like this.  Asking for something to sedate him.  Tells me that he has not been eating anything because he just vomits it.    Denies fever or chills.    Past Medical History:  Diagnosis Date  . Autistic disorder, current or active state   . Fatty liver   . Hypertension   . Obesity   . Pancreatitis    at age 9 years  . Pre-diabetes   . Seizures (HCC)    as of 11/23/15 - no seizures for 3 month  . Sleep apnea    not on cpap (can't tolerate)  . Tics of organic origin   . Tourette syndrome     Past Surgical History:  Procedure Laterality Date  . BIOPSY  11/08/2018   Procedure: BIOPSY;  Surgeon: Pyrtle, Jay M, MD;  Location: [facility];  Service: Gastroenterology;;  . ESOPHAGOGASTRODUODENOSCOPY (EGD) WITH PROPOFOL N/A 11/08/2018     Procedure: ESOPHAGOGASTRODUODENOSCOPY (EGD) WITH PROPOFOL;  Surgeon: Beverley FiedlerPyrtle, Jay M, MD;  Location: [facility];  Service: Gastroenterology;  Laterality: N/A;  . RADIOLOGY WITH ANESTHESIA N/A 11/24/2015   Procedure: CT SCAN ABDOMEN AND PELVIS WITH CONTRAST;  Surgeon: Medication Radiologist, MD;  Location: [facility] OR;  Service: Radiology;  Laterality: N/A;  . TONSILLECTOMY     and adenoidectomy    Family History  Problem Relation Age of Onset  . Seizures Maternal Aunt   . Anxiety disorder Paternal Uncle   . Depression Paternal Uncle   . Bipolar disorder Paternal Uncle   . Seizures Cousin        Maternal 1st and 2nd cousins have seizures  . Anxiety disorder Other        Paternal fhx  . Depression Other        Paternal fhx  . Hypertension  Other        Paternal fhx  . Bipolar disorder Other        Paternal fhx    Social History   Tobacco Use  . Smoking status: Never Smoker  . Smokeless tobacco: Never Used  Substance Use Topics  . Alcohol use: Never    Frequency: Never  . Drug use: Never    Prior to Admission medications   Medication Sig Start Date End Date Taking? Authorizing Provider  acetaminophen (TYLENOL) 650 MG suppository Place 650 mg rectally every 6 (six) hours as needed. 11/05/18  Yes [provider]  citalopram (CELEXA) 40 MG tablet Take 40 mg by mouth daily. 04/24/14  Yes [provider]  cloBAZam (ONFI) 20 MG tablet Take 60 mg by mouth 2 (two) times daily. 10/16/18  Yes [provider]  diazepam (DIASAT) 20 MG GEL Place 20 mg rectally daily as needed (for seizures lasting more than 5 minutes.).  07/03/13  Yes [provider]  diazepam (VALIUM) 5 MG tablet Take 7.5 mg by mouth every morning. May give 0.5 tablet (2.5mg ) as needed for agitation/anxiety 10/15/18  Yes [provider]  docusate sodium (COLACE) 100 MG capsule Take 100 mg by mouth 2 (two) times daily. 11/15/18 11/25/18 Yes [provider]  lisinopril (PRINIVIL,ZESTRIL) 20 MG tablet Take 20 mg by mouth daily. Take with 5mg  tablet for a total dose of 25mg  04/15/14  Yes [provider]  lisinopril (ZESTRIL) 5 MG tablet Take 5 mg by mouth daily. Take with 20mg  tablet for a total dose of 25mg  10/14/18  Yes [provider]  Melatonin 5 MG TABS Take 5 mg by mouth at bedtime.   Yes [provider]  Midazolam (NAYZILAM) 5 MG/0.1ML SOLN Place 5 mg into the nose daily as needed (seizure).   Yes [provider]  nystatin (MYCOSTATIN/NYSTOP) 100000 UNIT/GM POWD Apply 1 application topically daily. Neck, crevices, etc. 04/23/14  Yes [provider]  omeprazole (FIRST-OMEPRAZOLE) 2 mg/mL SUSP oral suspension Take 20 mLs (40 mg total) by mouth daily before breakfast. 11/08/18   Yes Dhungel, Nishant, MD  ondansetron (ZOFRAN ODT) 4 MG disintegrating tablet Take 1 tablet (4 mg total) by mouth every 8 (eight) hours as needed for nausea or vomiting. 11/12/18  Yes Sharen HeckGibbons, Claudia J, PA-C  polyethylene glycol (MIRALAX) 17 g packet Take 17 g by mouth daily. 11/08/18  Yes Dhungel, Nishant, MD  VIMPAT 10 MG/ML oral solution Take 10 mLs by mouth 2 (two) times daily. 10/28/18  Yes [provider]  ondansetron (ZOFRAN) 4 MG tablet Take 1 tablet (4 mg total) by mouth every 8 (eight)  hours as needed for nausea or vomiting. Patient not taking: Reported on 11/20/2018 11/08/18 11/08/19  Dhungel, Theda Belfast, MD  promethazine (PHENERGAN) 25 MG suppository Place 1 suppository (25 mg total) rectally every 6 (six) hours as needed for nausea or vomiting. 11/12/18   Liberty Handy, PA-C    No current facility-administered medications for this encounter.    Current Outpatient Medications  Medication Sig Dispense Refill  . acetaminophen (TYLENOL) 650 MG suppository Place 650 mg rectally every 6 (six) hours as needed.    . citalopram (CELEXA) 40 MG tablet Take 40 mg by mouth daily.  4  . cloBAZam (ONFI) 20 MG tablet Take 60 mg by mouth 2 (two) times daily.    . diazepam (DIASAT) 20 MG GEL Place 20 mg rectally daily as needed (for seizures lasting more than 5 minutes.).     Marland Kitchen diazepam (VALIUM) 5 MG tablet Take 7.5 mg by mouth every morning. May give 0.5 tablet (2.5mg ) as needed for agitation/anxiety    . docusate sodium (COLACE) 100 MG capsule Take 100 mg by mouth 2 (two) times daily.    Marland Kitchen lisinopril (PRINIVIL,ZESTRIL) 20 MG tablet Take 20 mg by mouth daily. Take with 5mg  tablet for a total dose of 25mg   9  . lisinopril (ZESTRIL) 5 MG tablet Take 5 mg by mouth daily. Take with 20mg  tablet for a total dose of 25mg     . Melatonin 5 MG TABS Take 5 mg by mouth at bedtime.    . Midazolam (NAYZILAM) 5 MG/0.1ML SOLN Place 5 mg into the nose daily as needed (seizure).    . nystatin  (MYCOSTATIN/NYSTOP) 100000 UNIT/GM POWD Apply 1 application topically daily. Neck, crevices, etc.  2  . omeprazole (FIRST-OMEPRAZOLE) 2 mg/mL SUSP oral suspension Take 20 mLs (40 mg total) by mouth daily before breakfast. 300 mL 0  . ondansetron (ZOFRAN ODT) 4 MG disintegrating tablet Take 1 tablet (4 mg total) by mouth every 8 (eight) hours as needed for nausea or vomiting. 20 tablet 0  . polyethylene glycol (MIRALAX) 17 g packet Take 17 g by mouth daily. 14 each 0  . VIMPAT 10 MG/ML oral solution Take 10 mLs by mouth 2 (two) times daily.    . ondansetron (ZOFRAN) 4 MG tablet Take 1 tablet (4 mg total) by mouth every 8 (eight) hours as needed for nausea or vomiting. (Patient not taking: Reported on 11/20/2018) 30 tablet 0  . promethazine (PHENERGAN) 25 MG suppository Place 1 suppository (25 mg total) rectally every 6 (six) hours as needed for nausea or vomiting. 12 each 0    Allergies as of 11/20/2018  . (No Known Allergies)     Review of Systems:  (answered by mom)  Constitutional: No weight loss, fever or chills Skin: No rash  Cardiovascular: No chest pain  Respiratory: No SOB  Gastrointestinal: See HPI and otherwise negative Genitourinary: No dysuria Neurological: No headache, dizziness or syncope Musculoskeletal: No new muscle or joint pain Hematologic: No bleeding  Psychiatric: No history of depression or anxiety    Physical Exam:  Vital signs in last 24 hours: Temp:  [98.6 F (37 C)] 98.6 F (37 C) (10/27 1154) Pulse Rate:  [124-128] 128 (10/27 1454) Resp:  [19-20] 20 (10/27 1454) BP: (133-146)/(66-98) 146/98 (10/27 1454) SpO2:  [98 %-99 %] 98 % (10/27 1454)   General:   Pleasant obese Caucasian male appears to be in NAD, Well developed, Well nourished, alert and cooperative +pulling at IV tape Head:  Normocephalic and atraumatic. Eyes:  PEERL, EOMI. No icterus.  Ears:  Normal auditory acuity. Neck:  Supple Throat: Oral cavity and pharynx without inflammation,  swelling or lesion.  Lungs: Respirations even and unlabored. Lungs clear to auscultation bilaterally.   No wheezes, crackles, or rhonchi.  Heart: Normal S1, S2. No MRG. Regular rate and rhythm. No peripheral edema, cyanosis or pallor.  Abdomen:  Soft, nondistended, nontender. No rebound or guarding. Decreased bs all 4 quadrants. No appreciable masses or hepatomegaly.(limited as patient would not lay down) Rectal:  Not performed.  Msk:  Symmetrical without gross deformities. Peripheral pulses intact.  Extremities:  Without edema, no deformity or joint abnormality.  Neurologic:  Alert and  oriented x4;  grossly normal neurologically.  Skin:   Dry and intact without significant lesions or rashes. Psychiatric: unable to communicate verbally   LAB RESULTS: Recent Labs    11/20/18 1436  WBC 8.2  HGB 14.1  HCT 43.1  PLT 389   STUDIES: Dg Abdomen Acute W/chest  Result Date: 11/20/2018 CLINICAL DATA:  Constipation, pain, vomiting EXAM: DG ABDOMEN ACUTE W/ 1V CHEST COMPARISON:  October 05, 2018 FINDINGS: Suboptimal evaluation due to difficulty with patient positioning. Low lung volumes secondary to poor inspiration. There is no free air. There are no dilated loops of bowel or air-fluid levels to suggest bowel obstruction. Overall stool burden appears mild, greater on the right. Increased density of colon contents could reflect some retained contrast material. IMPRESSION: No evidence of bowel obstruction. Overall mild stool burden, greater on the right. Electronically Signed   By: Macy Mis M.D.   On: 11/20/2018 16:09     Impression / Plan:   Impression: 1.  Constipation: 23 year old male with severe autism, no bowel movement in the past 5 weeks per mother, she has tried multiple laxatives at home, he would not drink a bowel prep, has only had one small marble sized stool over those weeks per mom, constant nausea with vomiting, now not eating and becoming weaker per mom   Plan: 1.   Patient needs to be admitted to the hospital team.  We will try to give him some prep and/or stool softeners and/or MiraLAX and/or tapwater enema as overnight, whatever he will let us do. 2.  Patient will be scheduled for a flex sigmoidoscopy tomorrow for removal of whatever stool we can get out.  Did explain this to mom.  She is aware it would not be a "clean whistle" cleanout. 3.  Ordered MiraLAX 17 g x 2 4.  Ordered bisacodyl 10 mg x 2 5.  Ordered tapwater enema x2. 6.  Patient will be on clear liquid diet and n.p.o. after midnight. 7. Added am TSH and cortisol 8.  Please await any further recommendations from Dr. Rush Landmark later today  Thank you for your kind consultation, we will continue to follow.  Lavone Nian Venice Marcucci  11/20/2018, 4:25 PM

## 2018-11-20 NOTE — ED Notes (Signed)
Lab at bedside to obtain bloodwork, reports labs will be moved to am with am blood draw for pt to be stuck in am.

## 2018-11-20 NOTE — Telephone Encounter (Signed)
Please respond to this as you indicated previously

## 2018-11-20 NOTE — Telephone Encounter (Signed)
Called and spoke with patient's mom-Christian-verified DPR-[name] reports the patient would not let her assist with an enema, however, the patient has had suppositories, Miralax, stool softeners, without any relief; [name] reports the patient is barely able to walk, not able to keep any food down, and has been able to have a bowel movement but blood has been seen at the rectum-but she does not know if the blood is from the suppositories or the patient straining attempting to have a bowel movement;   RN spoke with Dr. Lyndel Safe and RN has been advised to inform the patient's mother to take the patient to the [facility] ER and request the patient have sedation for disimpaction-Dr. Lyndel Safe reports he will call ahead to the ER for an update in symptoms from the last visit in the ER;  [name] has been informed of this information and will take the patient to the ER now;

## 2018-11-21 ENCOUNTER — Encounter (HOSPITAL_COMMUNITY)
Admission: EM | Disposition: A | Discharge status: Home Health | Payer: Self-pay | Source: Home / Self Care | Attending: Family Medicine

## 2018-11-21 ENCOUNTER — Observation Stay (HOSPITAL_COMMUNITY): Payer: Medicaid Other | Admitting: Certified Registered Nurse Anesthetist

## 2018-11-21 ENCOUNTER — Observation Stay (HOSPITAL_COMMUNITY): Payer: Medicaid Other

## 2018-11-21 ENCOUNTER — Encounter (HOSPITAL_COMMUNITY): Payer: Self-pay | Admitting: Gastroenterology

## 2018-11-21 DIAGNOSIS — T426X5A Adverse effect of other antiepileptic and sedative-hypnotic drugs, initial encounter: Secondary | ICD-10-CM | POA: Diagnosis present

## 2018-11-21 DIAGNOSIS — R946 Abnormal results of thyroid function studies: Secondary | ICD-10-CM | POA: Diagnosis present

## 2018-11-21 DIAGNOSIS — Z79899 Other long term (current) drug therapy: Secondary | ICD-10-CM | POA: Diagnosis not present

## 2018-11-21 DIAGNOSIS — I10 Essential (primary) hypertension: Secondary | ICD-10-CM | POA: Diagnosis present

## 2018-11-21 DIAGNOSIS — K5649 Other impaction of intestine: Secondary | ICD-10-CM | POA: Diagnosis not present

## 2018-11-21 DIAGNOSIS — K760 Fatty (change of) liver, not elsewhere classified: Secondary | ICD-10-CM | POA: Diagnosis present

## 2018-11-21 DIAGNOSIS — G40909 Epilepsy, unspecified, not intractable, without status epilepticus: Secondary | ICD-10-CM | POA: Diagnosis present

## 2018-11-21 DIAGNOSIS — Z818 Family history of other mental and behavioral disorders: Secondary | ICD-10-CM | POA: Diagnosis not present

## 2018-11-21 DIAGNOSIS — F952 Tourette's disorder: Secondary | ICD-10-CM | POA: Diagnosis present

## 2018-11-21 DIAGNOSIS — R109 Unspecified abdominal pain: Secondary | ICD-10-CM | POA: Diagnosis present

## 2018-11-21 DIAGNOSIS — K5900 Constipation, unspecified: Secondary | ICD-10-CM | POA: Diagnosis present

## 2018-11-21 DIAGNOSIS — G4730 Sleep apnea, unspecified: Secondary | ICD-10-CM | POA: Diagnosis present

## 2018-11-21 DIAGNOSIS — R739 Hyperglycemia, unspecified: Secondary | ICD-10-CM

## 2018-11-21 DIAGNOSIS — K2101 Gastro-esophageal reflux disease with esophagitis, with bleeding: Secondary | ICD-10-CM | POA: Diagnosis present

## 2018-11-21 DIAGNOSIS — F840 Autistic disorder: Secondary | ICD-10-CM | POA: Diagnosis present

## 2018-11-21 DIAGNOSIS — Z20828 Contact with and (suspected) exposure to other viral communicable diseases: Secondary | ICD-10-CM | POA: Diagnosis present

## 2018-11-21 DIAGNOSIS — Y92230 Patient room in hospital as the place of occurrence of the external cause: Secondary | ICD-10-CM | POA: Diagnosis present

## 2018-11-21 DIAGNOSIS — F418 Other specified anxiety disorders: Secondary | ICD-10-CM | POA: Diagnosis present

## 2018-11-21 DIAGNOSIS — Z8249 Family history of ischemic heart disease and other diseases of the circulatory system: Secondary | ICD-10-CM | POA: Diagnosis not present

## 2018-11-21 DIAGNOSIS — R7303 Prediabetes: Secondary | ICD-10-CM | POA: Diagnosis present

## 2018-11-21 DIAGNOSIS — K5909 Other constipation: Secondary | ICD-10-CM | POA: Diagnosis not present

## 2018-11-21 DIAGNOSIS — Z6841 Body Mass Index (BMI) 40.0 and over, adult: Secondary | ICD-10-CM | POA: Diagnosis not present

## 2018-11-21 DIAGNOSIS — E860 Dehydration: Secondary | ICD-10-CM | POA: Diagnosis present

## 2018-11-21 DIAGNOSIS — Z79890 Hormone replacement therapy (postmenopausal): Secondary | ICD-10-CM | POA: Diagnosis not present

## 2018-11-21 DIAGNOSIS — K649 Unspecified hemorrhoids: Secondary | ICD-10-CM | POA: Diagnosis present

## 2018-11-21 DIAGNOSIS — R945 Abnormal results of liver function studies: Secondary | ICD-10-CM | POA: Diagnosis present

## 2018-11-21 DIAGNOSIS — K5641 Fecal impaction: Secondary | ICD-10-CM

## 2018-11-21 DIAGNOSIS — K920 Hematemesis: Secondary | ICD-10-CM | POA: Diagnosis not present

## 2018-11-21 DIAGNOSIS — E876 Hypokalemia: Secondary | ICD-10-CM | POA: Diagnosis present

## 2018-11-21 HISTORY — PX: FLEXIBLE SIGMOIDOSCOPY: SHX5431

## 2018-11-21 HISTORY — PX: IMPACTION REMOVAL: SHX5858

## 2018-11-21 LAB — COMPREHENSIVE METABOLIC PANEL
ALT: 91 U/L — ABNORMAL HIGH (ref 0–44)
AST: 50 U/L — ABNORMAL HIGH (ref 15–41)
Albumin: 3.9 g/dL (ref 3.5–5.0)
Alkaline Phosphatase: 64 U/L (ref 38–126)
Anion gap: 11 (ref 5–15)
BUN: 9 mg/dL (ref 6–20)
CO2: 24 mmol/L (ref 22–32)
Calcium: 9 mg/dL (ref 8.9–10.3)
Chloride: 100 mmol/L (ref 98–111)
Creatinine, Ser: 0.66 mg/dL (ref 0.61–1.24)
GFR calc Af Amer: >60 mL/min (ref >60)
GFR calc non Af Amer: >60 mL/min (ref >60)
Glucose, Bld: 104 mg/dL — ABNORMAL HIGH (ref 70–99)
Potassium: 3.2 mmol/L — ABNORMAL LOW (ref 3.5–5.1)
Sodium: 135 mmol/L (ref 135–145)
Total Bilirubin: 0.9 mg/dL (ref 0.3–1.2)
Total Protein: 6.7 g/dL (ref 6.5–8.1)

## 2018-11-21 LAB — LIPASE, BLOOD: Lipase: 25 U/L (ref 11–51)

## 2018-11-21 LAB — HIV ANTIBODY (ROUTINE TESTING W REFLEX): HIV Screen 4th Generation wRfx: NONREACTIVE

## 2018-11-21 LAB — CORTISOL: Cortisol, Plasma: 16.2 ug/dL

## 2018-11-21 LAB — HEPATITIS B SURFACE ANTIGEN: Hepatitis B Surface Ag: NONREACTIVE

## 2018-11-21 LAB — TSH: TSH: 10.332 u[IU]/mL — ABNORMAL HIGH (ref 0.350–4.500)

## 2018-11-21 LAB — HEPATITIS B CORE ANTIBODY, TOTAL: Hep B Core Total Ab: NONREACTIVE

## 2018-11-21 LAB — HEPATITIS A ANTIBODY, TOTAL: hep A Total Ab: REACTIVE — AB

## 2018-11-21 LAB — HEPATITIS A ANTIBODY, IGM: Hep A IgM: NONREACTIVE

## 2018-11-21 LAB — SARS CORONAVIRUS 2 (TAT 6-24 HRS): SARS Coronavirus 2: NEGATIVE

## 2018-11-21 IMAGING — DX DG ABDOMEN 1V
1 series · 1 of 1 positions shown · non-contrast
Comparison: Radiographs dated [DATE]

CLINICAL DATA: NG tube placement.

EXAM:
ABDOMEN - 1 VIEW

[abdomen kub]
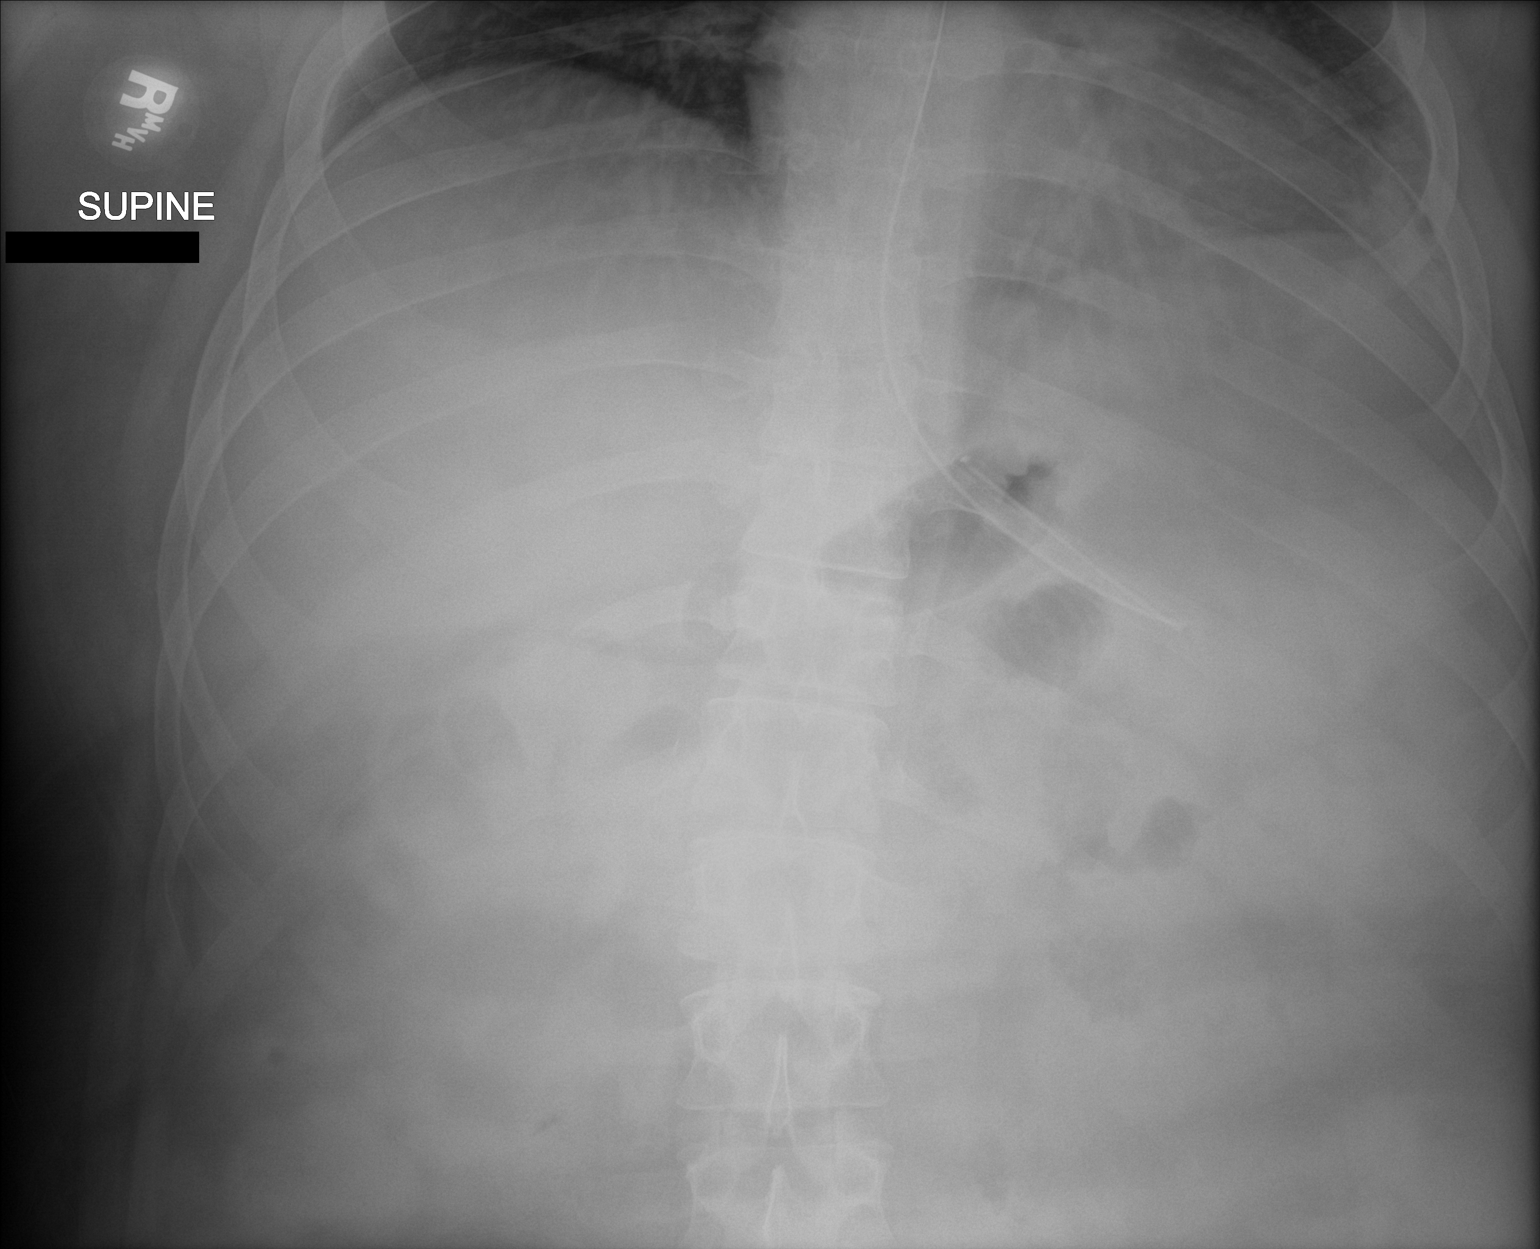

[1 of 1 positions shown; findings below may reference images not displayed]

FINDINGS: NG tube is been inserted and the tip is looped in the fundus of the
stomach. No dilated loops of large or small bowel. No visible free
air or free fluid. No significant bone abnormality.
IMPRESSION: Benign appearing abdomen. The tip of the tube is in the fundus of
the stomach.

## 2018-11-21 IMAGING — DX DG CHEST 1V PORT
1 series · 1 of 1 positions shown · non-contrast
Comparison: Chest x-ray dated [DATE]

CLINICAL DATA: NG tube placement.

EXAM:
PORTABLE CHEST 1 VIEW

[chest ap]
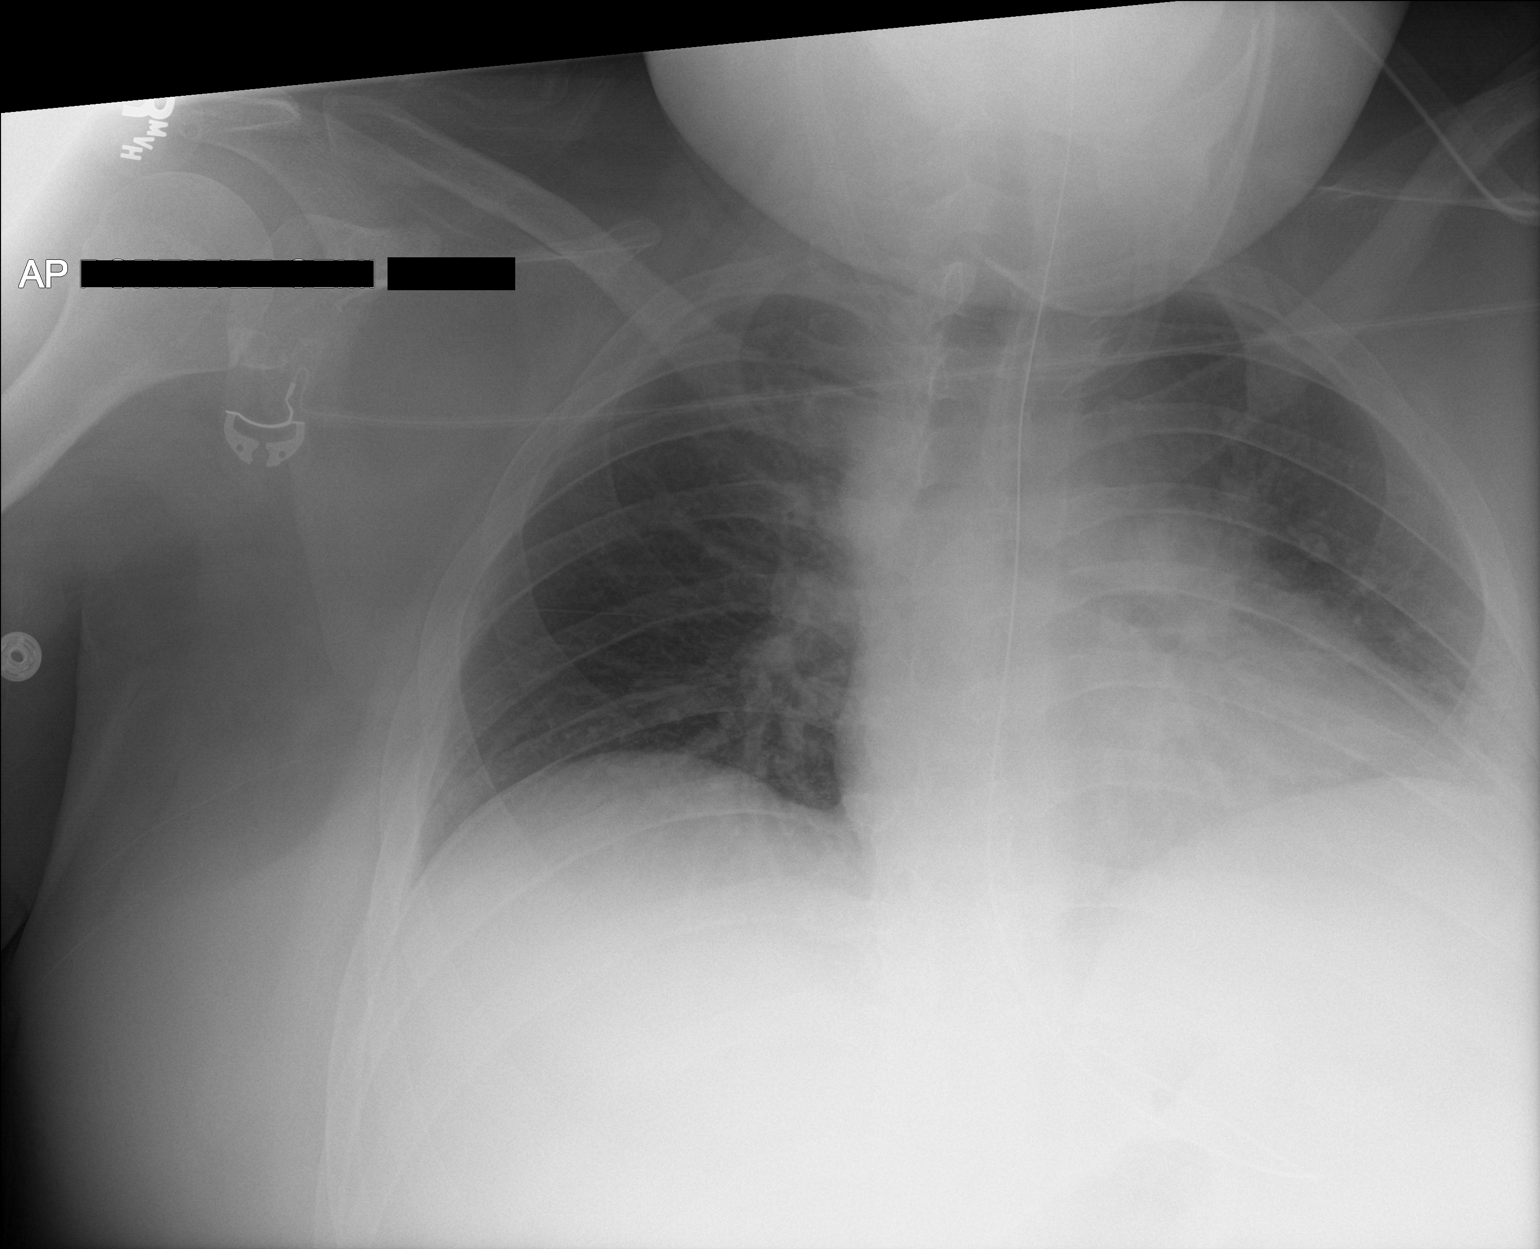

[1 of 1 positions shown; findings below may reference images not displayed]

FINDINGS: NG tube is looped in the fundus of the stomach.

Heart size and pulmonary vascularity are normal. Lungs are clear. No
acute bone abnormality.
IMPRESSION: No acute abnormalities. NG tube is looped in the fundus of the
stomach.

## 2018-11-21 SURGERY — SIGMOIDOSCOPY, FLEXIBLE
Anesthesia: Monitor Anesthesia Care

## 2018-11-21 MED ORDER — LIDOCAINE HCL URETHRAL/MUCOSAL 2 % EX GEL
CUTANEOUS | Status: AC
  Filled 2018-11-21: qty 10

## 2018-11-21 MED ORDER — PROPOFOL 500 MG/50ML IV EMUL
INTRAVENOUS | Status: DC | PRN
  Administered 2018-11-21: 100 ug/kg/min via INTRAVENOUS

## 2018-11-21 MED ORDER — FENTANYL CITRATE (PF) 100 MCG/2ML IJ SOLN
INTRAMUSCULAR | Status: DC | PRN
  Administered 2018-11-21 (×2): 50 ug via INTRAVENOUS

## 2018-11-21 MED ORDER — PROPOFOL 500 MG/50ML IV EMUL
INTRAVENOUS | Status: AC
  Filled 2018-11-21: qty 50

## 2018-11-21 MED ORDER — PROPOFOL 10 MG/ML IV BOLUS
INTRAVENOUS | Status: DC | PRN
  Administered 2018-11-21: 20 mg via INTRAVENOUS
  Administered 2018-11-21: 10 mg via INTRAVENOUS
  Administered 2018-11-21: 30 mg via INTRAVENOUS
  Administered 2018-11-21: 20 mg via INTRAVENOUS
  Administered 2018-11-21 (×2): 10 mg via INTRAVENOUS
  Administered 2018-11-21: 20 mg via INTRAVENOUS
  Administered 2018-11-21: 10 mg via INTRAVENOUS

## 2018-11-21 MED ORDER — LIDOCAINE HCL URETHRAL/MUCOSAL 2 % EX GEL
CUTANEOUS | Status: DC | PRN
  Administered 2018-11-21: 1

## 2018-11-21 MED ORDER — LACTATED RINGERS IV SOLN
INTRAVENOUS | Status: DC | PRN
  Administered 2018-11-21 (×2): via INTRAVENOUS

## 2018-11-21 MED ORDER — LISINOPRIL 5 MG PO TABS
25.0000 mg | ORAL_TABLET | Freq: Every day | ORAL | Status: DC
  Administered 2018-11-22 – 2018-11-27 (×6): 25 mg via ORAL
  Filled 2018-11-21 (×6): qty 1

## 2018-11-21 MED ORDER — FENTANYL CITRATE (PF) 100 MCG/2ML IJ SOLN
INTRAMUSCULAR | Status: AC
  Filled 2018-11-21: qty 2

## 2018-11-21 MED ORDER — PEG 3350-KCL-NA BICARB-NACL 420 G PO SOLR
4000.0000 mL | Freq: Once | ORAL | Status: AC
  Administered 2018-11-21: 4000 mL via ORAL

## 2018-11-21 MED ORDER — LIDOCAINE 2% (20 MG/ML) 5 ML SYRINGE
INTRAMUSCULAR | Status: DC | PRN
  Administered 2018-11-21: 100 mg via INTRAVENOUS

## 2018-11-21 MED ORDER — MIDAZOLAM HCL 5 MG/5ML IJ SOLN
INTRAMUSCULAR | Status: DC | PRN
  Administered 2018-11-21: 2 mg via INTRAVENOUS

## 2018-11-21 MED ORDER — SODIUM CHLORIDE 0.9 % IV SOLN: INTRAVENOUS | Status: DC

## 2018-11-21 MED ORDER — MIDAZOLAM HCL 2 MG/2ML IJ SOLN
INTRAMUSCULAR | Status: AC
  Filled 2018-11-21: qty 2

## 2018-11-21 NOTE — Op Note (Signed)
[facility] Patient Name: Aaron Vega Procedure Date: 11/21/2018 MRN: [id number] Attending MD: Justice Britain , MD Date of Birth: 03-28-1995 CSN: [id number] Age: 23 Admit Type: Inpatient Procedure:                Flexible Sigmoidoscopy Indications:              Abnormal abdominal x-ray of the GI tract, Abnormal                            CT of the GI tract, Constipation, Fecal impaction,                            Abdominal pain and nausea and vomiting Providers:                Justice Britain, MD, Carlyn Reichert, RN, Lazaro Arms, Technician, Adair Laundry, CRNA Referring MD:             Jackquline Denmark, MD, [facility] Medicines:                Monitored Anesthesia Care Complications:            No immediate complications. Estimated Blood Loss:     Estimated blood loss: none. Procedure:                Pre-Anesthesia Assessment:                           - Prior to the procedure, a History and Physical                            was performed, and patient medications and                            allergies were reviewed. The patient's tolerance of                            previous anesthesia was also reviewed. The risks                            and benefits of the procedure and the sedation                            options and risks were discussed with the patient.                            All questions were answered, and informed consent                            was obtained. Prior Anticoagulants: The patient has                            taken no previous anticoagulant or antiplatelet  agents. ASA Grade Assessment: III - A patient with                            severe systemic disease. After reviewing the risks                            and benefits, the patient was deemed in                            satisfactory condition to undergo the procedure.                           After  obtaining informed consent, the scope was                            passed under direct vision. The CF-HQ190L ([id number])                            Olympus colonoscope was introduced through the anus                            and advanced to the the left transverse colon. The                            flexible sigmoidoscopy was accomplished without                            difficulty. The patient tolerated the procedure. Scope In: 12:44:38 PM Scope Out: 1:25:21 PM Total Procedure Duration: 0 hours 40 minutes 43 seconds  Findings:      The digital rectal exam findings include hemorrhoids. For 5 minutes       disimpaction was performed manually.      Extensive amounts of semi-solid but mostly solid stool was found in the       entire colon, interfering with visualization. Lavage of the area was       performed using copious amounts, resulting in clearance with adequate       visualization. Removal of some of the stool was accomplished with a       combination of lavage and Roth net and snare. I was able to clear up       through the sigmoid colon the majority of hard stool. I lavaged       throughout the colon to aid in lubrication to hopefully help the patient.      Saline Enema was performed while patient was sedated at the completion       of procedure.      NGT placed while patient was sedated as noted below to aid in bowel       preparation. Impression:               - Hemorrhoids found on digital rectal exam.                           - Stool in the entire examined colon. Moderate Sedation:      Not Applicable - Patient had care per Anesthesia. Recommendation:           -  The patient will be observed post-procedure,                            until all discharge criteria are met.                           - Return patient to hospital ward for ongoing care.                           - NGT was placed while sedated after discussion                            with  mother/grandmother in effort of further bowel                            preparation ot aid in disimpaction. CXR/KUB for                            formal read but on my wet read look to be in good                            position. Bowel preparation 500 cc every 30 minutes                            in effort of completing the bowel preparation in                            the next 8-hours.                           - No further endoscopic intervention will aid in                            further decompression. If issues arise once again,                            then may require Cecostomy tube with instillation                            of laxative therapy or for decompression in future.                            Aggressive bowel regimen when discharged will be                            necessary.                           - Full Colonoscopy after decompression should be                            considered to ensure no subtle lesions or  obstructive processes are occuring.                           - The findings and recommendations were discussed                            with the patient's family. Procedure Code(s):        --- Professional ---                           [phone], Sigmoidoscopy, flexible; with removal of                            foreign body(s) Diagnosis Code(s):        --- Professional ---                           K64.9, Unspecified hemorrhoids                           R93.3, Abnormal findings on diagnostic imaging of                            other parts of digestive tract                           K59.00, Constipation, unspecified                           K56.41, Fecal impaction CPT copyright 2019 American Medical Association. All rights reserved. The codes documented in this report are preliminary and upon coder review may  be revised to meet current compliance requirements. Justice Britain, MD 11/21/2018 2:27:22  PM Number of Addenda: 0

## 2018-11-21 NOTE — Anesthesia Preprocedure Evaluation (Addendum)
Anesthesia Evaluation  Patient identified by MRN, date of birth, ID bandGeneral Assessment Comment:Patient awake  Reviewed: Allergy & Precautions, Patient's Chart, lab work & pertinent test results  Airway Mallampati: IV  TM Distance: >3 FB Neck ROM: Full    Dental no notable dental hx.    Pulmonary sleep apnea ,    Pulmonary exam normal breath sounds clear to auscultation       Cardiovascular hypertension, Pt. on medications Normal cardiovascular exam Rhythm:Regular Rate:Normal  ECG: ST, rate 116   Neuro/Psych Seizures -, Poorly Controlled,  PSYCHIATRIC DISORDERS Autistic disorder, current or active state    GI/Hepatic negative GI ROS, Neg liver ROS,   Endo/Other  Morbid obesity  Renal/GU negative Renal ROS     Musculoskeletal negative musculoskeletal ROS (+)   Abdominal (+) + obese,   Peds  Hematology negative hematology ROS (+)   Anesthesia Other Findings Constipation  Reproductive/Obstetrics                            Anesthesia Physical Anesthesia Plan  ASA: III  Anesthesia Plan: MAC   Post-op Pain Management:    Induction: Intravenous  PONV Risk Score and Plan: 1 and Propofol infusion and Treatment may vary due to age or medical condition  Airway Management Planned: Simple Face Mask  Additional Equipment:   Intra-op Plan:   Post-operative Plan:   Informed Consent: I have reviewed the patients History and Physical, chart, labs and discussed the procedure including the risks, benefits and alternatives for the proposed anesthesia with the patient or authorized representative who has indicated his/her understanding and acceptance.     Dental advisory given  Plan Discussed with: CRNA  Anesthesia Plan Comments:        Anesthesia Quick Evaluation

## 2018-11-21 NOTE — Progress Notes (Signed)
   11/20/18 2129  Vitals  Temp 98 F (36.7 C)  Temp Source Axillary  BP (!) 157/96  MAP (mmHg) 115  BP Location Left Arm  BP Method Automatic  Patient Position (if appropriate) Sitting  Pulse Rate (!) 134  Pulse Rate Source Dinamap  Resp (!) 23  Oxygen Therapy  SpO2 96 %  O2 Device Room Air  MEWS Score  MEWS RR 1  MEWS Pulse 3  MEWS Systolic 0  MEWS LOC 0  MEWS Temp 0  MEWS Score 4  MEWS Score Color Red  mews inititated/on call/ac notified

## 2018-11-21 NOTE — Progress Notes (Signed)
Tap water enema administered per Dr. Rush Landmark in procedure room following flexible sigmoidoscopy 11/21/2018 at 1320. Patient has not had return of stool or enema liquid at this time.

## 2018-11-21 NOTE — Transfer of Care (Signed)
Immediate Anesthesia Transfer of Care Note  Patient: Aaron Vega  Procedure(s) Performed: FLEXIBLE SIGMOIDOSCOPY (N/A ) IMPACTION REMOVAL  Patient Location: Endoscopy Unit  Anesthesia Type:MAC  Level of Consciousness: drowsy  Airway & Oxygen Therapy: Patient Spontanous Breathing and Patient connected to face mask oxygen  Post-op Assessment: Report given to RN and Post -op Vital signs reviewed and stable  Post vital signs: Reviewed and stable  Last Vitals:  Vitals Value Taken Time  BP    Temp    Pulse    Resp    SpO2      Last Pain:  Vitals:   11/21/18 1136  TempSrc: Axillary  PainSc: 0-No pain         Complications: No apparent anesthesia complications

## 2018-11-21 NOTE — Progress Notes (Signed)
Refused all Po meds/tap water enemas.  Mother at bedside

## 2018-11-21 NOTE — Interval H&P Note (Signed)
History and Physical Interval Note:  11/21/2018 12:27 PM  Aaron Vega  has presented today for surgery, with the diagnosis of Constipation.  The various methods of treatment have been discussed with the patient and family. After consideration of risks, benefits and other options for treatment, the patient has consented to  Procedure(s): FLEXIBLE SIGMOIDOSCOPY (N/A) as a surgical intervention.  The patient's history has been reviewed, patient examined, no change in status, stable for surgery.  I have reviewed the patient's chart and labs.  Questions were answered to the patient's satisfaction.     [employer]

## 2018-11-21 NOTE — Progress Notes (Signed)
During admission transport at 2105, patient with noted tonic/clonic seizure in wheelchair/hallway over one minute.  Midazolam 5mg  adm at 2110 with good relief.  Patient placed back to bed.  Postictal approx 30-45 miniutes.  Patient alert/somelent at present, mother at bedside

## 2018-11-21 NOTE — Progress Notes (Signed)
Patient pulled out ng tube with mother in the room, before she could stop it.  Patient had received 1 liter of prep before pulling out.

## 2018-11-21 NOTE — Progress Notes (Signed)
PROGRESS NOTE    Aaron Vega  ZOX:[id number] DOB: 07/31/1995 DOA: 11/20/2018 [facility]   Brief Narrative: Aaron Vega is a 23 y.o. male with a history of autism, seizure disorder, obesity. Patient presented secondary to constipation and decreased mobility. Concern for severe constipation/impaction. GI consulted for management.   Assessment & Plan:   Active Problems:   Constipation   Abnormal LFTs   Hyperglycemia   Obesity, Class III, BMI 40-49.9 (morbid obesity) (HCC)   Constipation/impaction Clinically significant. No BM in several weeks per mother. Imaging consistent with mild stool burden from recent CT performed at OSH in addition to abdominal x-ray performed on admission. GI consulted and is planning on flexible sigmoidoscopy for management. Patient not adherent with medication management secondary to behavioral baseline. -GI recommendations: Flex sig today -Continue Mag citrate if patient will take  Seizure disorder Patient with a seizure on 10/27. Patient has not received his AED medication secondary to recent vomiting. Seizure resolved with Versed. Currently baseline -Continue clobazam, Vimpat -Continue diazepam and midazolam prn  Elevated AST/ALT Likely secondary to AED medications. Recent CT scan without liver/gallbladder pathology.  Obesity Body mass index is 48.44 kg/m.   Hyperglycemia Mild.  Essential hypertension Patient is on lisinopril as an outpatient. BP is currently elevated. -Restart home lisinopril 25 mg daily  Autism/Behavioral -Continue Celexa -Continue diazepam prn   DVT prophylaxis: SCDs Code Status:   Code Status: Full Code Family Communication: Mother at bedside Disposition Plan: Discharge pending GI recommendations and improvement of impaction   Consultants:   [facility]  Procedures:   None  Antimicrobials:  None    Subjective: Seizure overnight. No other issues per mother.  Patient is non-verbal and non-communicative.   Objective: Vitals:   11/21/18 0237 11/21/18 0653 11/21/18 1051 11/21/18 1136  BP: (!) 147/85 (!) 146/90 (!) 130/95 (!) 166/87  Pulse: (!) 103 (!) 110 (!) 106 (!) 102  Resp: 17 18 18 18   Temp: 98.8 F (37.1 C) 99.5 F (37.5 C) 99.5 F (37.5 C) 98.8 F (37.1 C)  TempSrc: Axillary Axillary Oral Axillary  SpO2: 94% 97% 96% 98%  Weight:    (!) 148.8 kg  Height:    5\' 9"  (1.753 m)    Intake/Output Summary (Last 24 hours) at 11/21/2018 1339 Last data filed at 11/21/2018 0600 Gross per 24 hour  Intake 884.86 ml  Output -  Net 884.86 ml   Filed Weights   11/21/18 1136  Weight: (!) 148.8 kg    Examination:  General exam: Appears calm and comfortable Respiratory system: Clear to auscultation. Respiratory effort normal. Cardiovascular system: S1 & S2 heard, RRR. No murmurs, rubs, gallops or clicks. Gastrointestinal system: Abdomen is nondistended, soft and possibly tender as he tried to move my hands away on exam. No organomegaly or masses felt. Normal bowel sounds heard. Central nervous system: Alert Extremities: No edema. No calf tenderness Skin: No cyanosis. No rashes    Data Reviewed: I have personally reviewed following labs and imaging studies  CBC: Recent Labs  Lab 11/20/18 1436  WBC 8.2  NEUTROABS 5.3  HGB 14.1  HCT 43.1  MCV 85.0  PLT 389   Basic Metabolic Panel: Recent Labs  Lab 11/20/18 1436  NA 137  K 3.9  CL 99  CO2 25  GLUCOSE 109*  BUN 9  CREATININE 0.73  CALCIUM 9.7   GFR: Estimated Creatinine Clearance: 210.5 mL/min (by C-G formula based on SCr of 0.73 mg/dL). Liver Function Tests: Recent Labs  Lab 11/20/18  1436  AST 75*  ALT 112*  ALKPHOS 78  BILITOT 1.4*  PROT 7.8  ALBUMIN 4.6   No results for input(s): LIPASE, AMYLASE in the last 168 hours. No results for input(s): AMMONIA in the last 168 hours. Coagulation Profile: No results for input(s): INR, PROTIME in the last 168 hours.  Cardiac Enzymes: No results for input(s): CKTOTAL, CKMB, CKMBINDEX, TROPONINI in the last 168 hours. BNP (last 3 results) No results for input(s): PROBNP in the last 8760 hours. HbA1C: No results for input(s): HGBA1C in the last 72 hours. CBG: No results for input(s): GLUCAP in the last 168 hours. Lipid Profile: No results for input(s): CHOL, HDL, LDLCALC, TRIG, CHOLHDL, LDLDIRECT in the last 72 hours. Thyroid Function Tests: No results for input(s): TSH, T4TOTAL, FREET4, T3FREE, THYROIDAB in the last 72 hours. Anemia Panel: No results for input(s): VITAMINB12, FOLATE, FERRITIN, TIBC, IRON, RETICCTPCT in the last 72 hours. Sepsis Labs: No results for input(s): PROCALCITON, LATICACIDVEN in the last 168 hours.  Recent Results (from the past 240 hour(s))  SARS CORONAVIRUS 2 (TAT 6-24 HRS) Nasopharyngeal Nasopharyngeal Swab     Status: None   Collection Time: 11/20/18  6:43 PM   Specimen: Nasopharyngeal Swab  Result Value Ref Range Status   SARS Coronavirus 2 NEGATIVE NEGATIVE Final    Comment: (NOTE) SARS-CoV-2 target nucleic acids are NOT DETECTED. The SARS-CoV-2 RNA is generally detectable in upper and lower respiratory specimens during the acute phase of infection. Negative results do not preclude SARS-CoV-2 infection, do not rule out co-infections with other pathogens, and should not be used as the sole basis for treatment or other patient management decisions. Negative results must be combined with clinical observations, patient history, and epidemiological information. The expected result is Negative. Fact Sheet for Patients: HairSlick.no Fact Sheet for Healthcare Providers: quierodirigir.com This test is not yet approved or cleared by the [place] FDA and  has been authorized for detection and/or diagnosis of SARS-CoV-2 by FDA under an Emergency Use Authorization (EUA). This EUA will remain  in effect (meaning this  test can be used) for the duration of the COVID-19 declaration under Section 56 4(b)(1) of the Act, 21 U.S.C. section 360bbb-3(b)(1), unless the authorization is terminated or revoked sooner. Performed at [facility], [address] [place], [place] [address]          Radiology Studies: Dg Abdomen Acute W/chest  Result Date: 11/20/2018 CLINICAL DATA:  Constipation, pain, vomiting EXAM: DG ABDOMEN ACUTE W/ 1V CHEST COMPARISON:  October 05, 2018 FINDINGS: Suboptimal evaluation due to difficulty with patient positioning. Low lung volumes secondary to poor inspiration. There is no free air. There are no dilated loops of bowel or air-fluid levels to suggest bowel obstruction. Overall stool burden appears mild, greater on the right. Increased density of colon contents could reflect some retained contrast material. IMPRESSION: No evidence of bowel obstruction. Overall mild stool burden, greater on the right. Electronically Signed   By: Guadlupe Spanish M.D.   On: 11/20/2018 16:09        Scheduled Meds: . [MAR Hold] bisacodyl  10 mg Oral BID  . [MAR Hold] citalopram  40 mg Oral Daily  . [MAR Hold] cloBAZam  60 mg Oral BID  . [MAR Hold] diazepam  7.5 mg Oral Daily  . [MAR Hold] magnesium citrate  1 Bottle Oral NOW  . magnesium citrate  1 Bottle Oral Q4H  . [MAR Hold] polyethylene glycol  17 g Oral BID   Continuous Infusions: .  sodium chloride    . sodium chloride 150 mL/hr at 11/20/18 2246  . [MAR Hold] lacosamide (VIMPAT) IV 100 mg (11/21/18 3005)     LOS: 0 days     Cordelia Poche, MD [facility] 11/21/2018, 1:39 PM  If 7PM-7AM, please contact night-coverage [email or web address]

## 2018-11-22 ENCOUNTER — Encounter (HOSPITAL_COMMUNITY): Payer: Self-pay | Admitting: Gastroenterology

## 2018-11-22 ENCOUNTER — Encounter

## 2018-11-22 ENCOUNTER — Ambulatory Visit: Payer: Medicaid Other | Admitting: Gastroenterology

## 2018-11-22 ENCOUNTER — Ambulatory Visit: Payer: Self-pay | Admitting: Gastroenterology

## 2018-11-22 DIAGNOSIS — K5909 Other constipation: Secondary | ICD-10-CM

## 2018-11-22 DIAGNOSIS — K5641 Fecal impaction: Secondary | ICD-10-CM | POA: Diagnosis not present

## 2018-11-22 LAB — MAGNESIUM: Magnesium: 1.8 mg/dL (ref 1.7–2.4)

## 2018-11-22 LAB — HEPATITIS B SURFACE ANTIBODY, QUANTITATIVE: Hep B S AB Quant (Post): 4.6 m[IU]/mL — ABNORMAL LOW (ref >9.9)

## 2018-11-22 LAB — GLIA (IGA/G) + TTG IGA
Antigliadin Abs, IgA: 3 units (ref 0–19)
Gliadin IgG: 5 units (ref 0–19)
Tissue Transglutaminase Ab, IgA: <2 U/mL (ref 0–3)

## 2018-11-22 LAB — HCV INTERPRETATION

## 2018-11-22 LAB — HCV AB W REFLEX TO QUANT PCR: HCV Ab: <0.1 s/co ratio (ref 0.0–0.9)

## 2018-11-22 LAB — T4, FREE: Free T4: 0.99 ng/dL (ref 0.61–1.12)

## 2018-11-22 MED ORDER — POTASSIUM CHLORIDE CRYS ER 20 MEQ PO TBCR: 40.0000 meq | EXTENDED_RELEASE_TABLET | Freq: Once | ORAL | Status: DC

## 2018-11-22 MED ORDER — ONDANSETRON HCL 4 MG/2ML IJ SOLN
4.0000 mg | Freq: Four times a day (QID) | INTRAMUSCULAR | Status: DC | PRN
  Administered 2018-11-23 – 2018-11-25 (×4): 4 mg via INTRAVENOUS
  Filled 2018-11-22 (×5): qty 2

## 2018-11-22 MED ORDER — POTASSIUM CHLORIDE 10 MEQ/100ML IV SOLN
10.0000 meq | INTRAVENOUS | Status: AC
  Administered 2018-11-22 (×4): 10 meq via INTRAVENOUS
  Filled 2018-11-22 (×4): qty 100

## 2018-11-22 MED ORDER — LINACLOTIDE 145 MCG PO CAPS
290.0000 ug | ORAL_CAPSULE | Freq: Every day | ORAL | Status: DC
  Administered 2018-11-23 – 2018-11-25 (×3): 290 ug via ORAL
  Filled 2018-11-22 (×5): qty 2

## 2018-11-22 NOTE — Progress Notes (Signed)
This nurse was notified by phone that my patient in room 514 had a fall. This nurse went to check on patient staff was in the room assisting patient to bedside commode. Pt was alert. Vitals stable. Assessed patient no signs of injury. Mother was present in the room at bedside. Asked mother what happened. Mother stated she was resting heard the bed alarm going off. Pt was sitting on the side of the bed. Mother stated she was getting up to try to assist patient but he had already gotten up. Mother stated her and aide was able to assist the patient to his knee. Pt stable. Bed alarm on. Yellow socks on, fall bracelet on. Floor mats in place. Pt/mother educated about safety and using the call light for assistance. Pt possession in reach. Mother at bedside. MD notified.

## 2018-11-22 NOTE — Plan of Care (Signed)
  Problem: Safety: Goal: Ability to remain free from injury will improve Outcome: Not Progressing   

## 2018-11-22 NOTE — Progress Notes (Addendum)
This nurse and aide was assisting patient off bedside commode. Pt made it to the bed bent over with hands on bed pt getting cleaned up from bowel movement. Pt legs became weak. This nurse and aide was able to assist the pt down to his knees. Assessed patient no injuries. Mother at bedside. Unable to assist patient back to bed. Received call from monitor room that patient heart rate was sustaining at 140's pt was fatigue and unable to assist. Called a rapid response. Staff/ Rapid team arrived. Helped to assist patient to bed. Unable to get patient up. A hover lift was obtained to lift patient to bed. Patient tolerated well. Patient resting comfortably. Vitals stable. Heart rate 118 per heart monitor. Bed alarm on, yellow socks on, falls bracelet on, floor mats in place, pt possessions in reach, pt/family educated about safety and the use of call light. Mother at beside. MD notified.

## 2018-11-22 NOTE — Progress Notes (Signed)
PROGRESS NOTE    Aaron Vega  HYI:[id number] DOB: 1995-11-19 DOA: 11/20/2018 [name]   Brief Narrative: Aaron Vega is a 23 y.o. male with a history of autism, seizure disorder, obesity. Patient presented secondary to constipation and decreased mobility. Concern for severe constipation/impaction. GI consulted for management.   Assessment & Plan:   Active Problems:   Constipation   Abnormal LFTs   Hyperglycemia   Obesity, Class III, BMI 40-49.9 (morbid obesity) (HCC)   Constipation/impaction Clinically significant. No BM in several weeks per mother. Imaging consistent with mild stool burden from recent CT performed at OSH in addition to abdominal x-ray performed on admission. GI consulted and is planning on flexible sigmoidoscopy for management. Patient not adherent with medication management secondary to behavioral diagnoses. Flexible sigmoidoscopy performed on 10/28 and significant for stool in the entire examined colon; lavage performed. NG placed with bowel prep administered overnight. -GI recommendations: bowel regimen, colonoscopy at some point, will require cecostomy tube if recurrent issues arise  Weakness Patient had a witness/controlled fall overnight. No neurological deficits noted on exam. Weakness has been progressing over the past 4+ weeks per mother. -PT evaluation  Elevated TSH On chart review, he has had a recently normal free T4 with elevated TSH. It is possible he has subclinical hypothyroidism. -Free T3/T4  Seizure disorder Patient with a seizure on 10/27. Patient has not received his AED medication secondary to recent vomiting. Seizure resolved with Versed. Currently baseline -Continue clobazam, Vimpat -Continue diazepam and midazolam prn  Elevated AST/ALT Likely secondary to AED medications. Recent CT scan without liver/gallbladder pathology.  Obesity Body mass index is 48.44 kg/m.   Hyperglycemia Mild.   Essential hypertension Patient is on lisinopril as an outpatient. BP is currently elevated. -Restart home lisinopril 25 mg daily  Autism/Behavioral -Continue Celexa -Continue diazepam prn   DVT prophylaxis: SCDs Code Status:   Code Status: Full Code Family Communication: Mother at bedside Disposition Plan: Discharge pending GI recommendations and improvement of impaction   Consultants:   [facility]  Procedures:   None  Antimicrobials:  None    Subjective: Seizure overnight. No other issues per mother. Patient is non-verbal and non-communicative.   Objective: Vitals:   11/22/18 0225 11/22/18 0406 11/22/18 0620 11/22/18 1005  BP: 133/61 134/90 (!) 148/93 (!) 160/93  Pulse: (!) 125 (!) 112 (!) 110 (!) 118  Resp: 16 16 16    Temp: 97.7 F (36.5 C) 98.9 F (37.2 C) 98.6 F (37 C) 97.8 F (36.6 C)  TempSrc: Oral Oral Oral Oral  SpO2: 97% 94% 97% 93%  Weight:      Height:        Intake/Output Summary (Last 24 hours) at 11/22/2018 1238 Last data filed at 11/22/2018 0600 Gross per 24 hour  Intake 1270.61 ml  Output -  Net 1270.61 ml   Filed Weights   11/21/18 1136  Weight: (!) 148.8 kg    Examination:  General exam: Appears calm and comfortable Respiratory system: Clear to auscultation. Respiratory effort normal. Cardiovascular system: S1 & S2 heard, RRR. No murmurs, rubs, gallops or clicks. Gastrointestinal system: Abdomen is nondistended, soft and nontender. No organomegaly or masses felt. Normal bowel sounds heard. Central nervous system: Alert. No focal neurological deficits. Equal bilateral LE reflexes with good tone/strength bilaterally Extremities: No edema. No calf tenderness Skin: No cyanosis. No rashes Psych: non-verbal    Data Reviewed: I have personally reviewed following labs and imaging studies  CBC: Recent Labs  Lab 11/20/18 1436  WBC  8.2  NEUTROABS 5.3  HGB 14.1  HCT 43.1  MCV 85.0  PLT 389   Basic Metabolic Panel: Recent  Labs  Lab 11/20/18 1436 11/21/18 1519  NA 137 135  K 3.9 3.2*  CL 99 100  CO2 25 24  GLUCOSE 109* 104*  BUN 9 9  CREATININE 0.73 0.66  CALCIUM 9.7 9.0   GFR: Estimated Creatinine Clearance: 210.5 mL/min (by C-G formula based on SCr of 0.66 mg/dL). Liver Function Tests: Recent Labs  Lab 11/20/18 1436 11/21/18 1519  AST 75* 50*  ALT 112* 91*  ALKPHOS 78 64  BILITOT 1.4* 0.9  PROT 7.8 6.7  ALBUMIN 4.6 3.9   Recent Labs  Lab 11/21/18 1519  LIPASE 25   No results for input(s): AMMONIA in the last 168 hours. Coagulation Profile: No results for input(s): INR, PROTIME in the last 168 hours. Cardiac Enzymes: No results for input(s): CKTOTAL, CKMB, CKMBINDEX, TROPONINI in the last 168 hours. BNP (last 3 results) No results for input(s): PROBNP in the last 8760 hours. HbA1C: No results for input(s): HGBA1C in the last 72 hours. CBG: No results for input(s): GLUCAP in the last 168 hours. Lipid Profile: No results for input(s): CHOL, HDL, LDLCALC, TRIG, CHOLHDL, LDLDIRECT in the last 72 hours. Thyroid Function Tests: Recent Labs    11/21/18 1519  TSH 10.332*   Anemia Panel: No results for input(s): VITAMINB12, FOLATE, FERRITIN, TIBC, IRON, RETICCTPCT in the last 72 hours. Sepsis Labs: No results for input(s): PROCALCITON, LATICACIDVEN in the last 168 hours.  Recent Results (from the past 240 hour(s))  SARS CORONAVIRUS 2 (TAT 6-24 HRS) Nasopharyngeal Nasopharyngeal Swab     Status: None   Collection Time: 11/20/18  6:43 PM   Specimen: Nasopharyngeal Swab  Result Value Ref Range Status   SARS Coronavirus 2 NEGATIVE NEGATIVE Final    Comment: (NOTE) SARS-CoV-2 target nucleic acids are NOT DETECTED. The SARS-CoV-2 RNA is generally detectable in upper and lower respiratory specimens during the acute phase of infection. Negative results do not preclude SARS-CoV-2 infection, do not rule out co-infections with other pathogens, and should not be used as the sole basis  for treatment or other patient management decisions. Negative results must be combined with clinical observations, patient history, and epidemiological information. The expected result is Negative. Fact Sheet for Patients: HairSlick.nohttps://www.fda.gov/media/138098/download Fact Sheet for Healthcare Providers: quierodirigir.comhttps://www.fda.gov/media/138095/download This test is not yet approved or cleared by the [place] FDA and  has been authorized for detection and/or diagnosis of SARS-CoV-2 by FDA under an Emergency Use Authorization (EUA). This EUA will remain  in effect (meaning this test can be used) for the duration of the COVID-19 declaration under Section 56 4(b)(1) of the Act, 21 U.S.C. section 360bbb-3(b)(1), unless the authorization is terminated or revoked sooner. Performed at [facility], [address] [place], [place] [address]          Radiology Studies: Dg Abd 1 View  Result Date: 11/21/2018 CLINICAL DATA:  NG tube placement. EXAM: ABDOMEN - 1 VIEW COMPARISON:  Radiographs dated 11/20/2018 FINDINGS: NG tube is been inserted and the tip is looped in the fundus of the stomach. No dilated loops of large or small bowel. No visible free air or free fluid. No significant bone abnormality. IMPRESSION: Benign appearing abdomen. The tip of the tube is in the fundus of the stomach. Electronically Signed   By: Francene BoyersJames  Maxwell M.D.   On: 11/21/2018 14:25   Dg Chest Port 1 View  Result Date:  11/21/2018 CLINICAL DATA:  NG tube placement. EXAM: PORTABLE CHEST 1 VIEW COMPARISON:  Chest x-ray dated 11/03/2018 FINDINGS: NG tube is looped in the fundus of the stomach. Heart size and pulmonary vascularity are normal. Lungs are clear. No acute bone abnormality. IMPRESSION: No acute abnormalities. NG tube is looped in the fundus of the stomach. Electronically Signed   By: Lorriane Shire M.D.   On: 11/21/2018 14:24   Dg Abdomen Acute W/chest  Result Date: 11/20/2018 CLINICAL DATA:   Constipation, pain, vomiting EXAM: DG ABDOMEN ACUTE W/ 1V CHEST COMPARISON:  October 05, 2018 FINDINGS: Suboptimal evaluation due to difficulty with patient positioning. Low lung volumes secondary to poor inspiration. There is no free air. There are no dilated loops of bowel or air-fluid levels to suggest bowel obstruction. Overall stool burden appears mild, greater on the right. Increased density of colon contents could reflect some retained contrast material. IMPRESSION: No evidence of bowel obstruction. Overall mild stool burden, greater on the right. Electronically Signed   By: Macy Mis M.D.   On: 11/20/2018 16:09        Scheduled Meds: . bisacodyl  10 mg Oral BID  . citalopram  40 mg Oral Daily  . cloBAZam  60 mg Oral BID  . diazepam  7.5 mg Oral Daily  . [START ON 11/23/2018] linaclotide  290 mcg Oral QAC breakfast  . lisinopril  25 mg Oral Daily  . polyethylene glycol  17 g Oral BID  . potassium chloride  40 mEq Oral Once   Continuous Infusions: . lacosamide (VIMPAT) IV Stopped (11/21/18 2335)     LOS: 1 day     Cordelia Poche, MD [facility] 11/22/2018, 12:38 PM  If 7PM-7AM, please contact night-coverage [email or web address]

## 2018-11-22 NOTE — Progress Notes (Addendum)
Progress Note   Subjective  Chief Complaint: Constipation and impaction  This morning the patient is found sleeping under a sheet.  Per mom who is at bedside he fell twice yesterday because he is too weak to get up and go to the bathroom.  He was assisted to the floor by her and a nurse.  He did pull his NG tube out after eating about a liter of his prep yesterday.  Mom describes he is having liquid stool.   Objective   Vital signs in last 24 hours: Temp:  [97.7 F (36.5 C)-99.5 F (37.5 C)] 98.6 F (37 C) (10/29 0620) Pulse Rate:  [92-125] 110 (10/29 0620) Resp:  [16-19] 16 (10/29 0620) BP: (130-174)/(61-110) 148/93 (10/29 0620) SpO2:  [90 %-100 %] 97 % (10/29 0620) Weight:  [148.8 kg] 148.8 kg (10/28 1136) Last BM Date: 10/24/18(Per pt mother) General:    white male in NAD-asleep Heart:  Regular rate and rhythm; no murmurs Lungs: Respirations even and unlabored, lungs CTA bilaterally Abdomen:  Soft, nontender and nondistended. Normal bowel sounds.  Intake/Output from previous day: 10/28 0701 - 10/29 0700 In: 1786.5 [I.V.:1630.3; IV Piggyback:156.3] Out: -    Lab Results: Recent Labs    11/20/18 1436  WBC 8.2  HGB 14.1  HCT 43.1  PLT 389   BMET Recent Labs    11/20/18 1436 11/21/18 1519  NA 137 135  K 3.9 3.2*  CL 99 100  CO2 25 24  GLUCOSE 109* 104*  BUN 9 9  CREATININE 0.73 0.66  CALCIUM 9.7 9.0   LFT Recent Labs    11/21/18 1519  PROT 6.7  ALBUMIN 3.9  AST 50*  ALT 91*  ALKPHOS 64  BILITOT 0.9    Studies/Results: Dg Abd 1 View  Result Date: 11/21/2018 CLINICAL DATA:  NG tube placement. EXAM: ABDOMEN - 1 VIEW COMPARISON:  Radiographs dated 11/20/2018 FINDINGS: NG tube is been inserted and the tip is looped in the fundus of the stomach. No dilated loops of large or small bowel. No visible free air or free fluid. No significant bone abnormality. IMPRESSION: Benign appearing abdomen. The tip of the tube is in the fundus of the stomach.  Electronically Signed   By: Lorriane Shire M.D.   On: 11/21/2018 14:25   Dg Chest Port 1 View  Result Date: 11/21/2018 CLINICAL DATA:  NG tube placement. EXAM: PORTABLE CHEST 1 VIEW COMPARISON:  Chest x-ray dated 11/03/2018 FINDINGS: NG tube is looped in the fundus of the stomach. Heart size and pulmonary vascularity are normal. Lungs are clear. No acute bone abnormality. IMPRESSION: No acute abnormalities. NG tube is looped in the fundus of the stomach. Electronically Signed   By: Lorriane Shire M.D.   On: 11/21/2018 14:24   Dg Abdomen Acute W/chest  Result Date: 11/20/2018 CLINICAL DATA:  Constipation, pain, vomiting EXAM: DG ABDOMEN ACUTE W/ 1V CHEST COMPARISON:  October 05, 2018 FINDINGS: Suboptimal evaluation due to difficulty with patient positioning. Low lung volumes secondary to poor inspiration. There is no free air. There are no dilated loops of bowel or air-fluid levels to suggest bowel obstruction. Overall stool burden appears mild, greater on the right. Increased density of colon contents could reflect some retained contrast material. IMPRESSION: No evidence of bowel obstruction. Overall mild stool burden, greater on the right. Electronically Signed   By: Macy Mis M.D.   On: 11/20/2018 16:09     Assessment / Plan:   Assessment: 1.  Constipation with impaction:  23 year old male with severe autism, unable to take laxatives at home, underwent flex sig with disimpaction 11/21/2018, NG tube placed and 1 L of prep was given, then patient pulled NG tube, having liquid bowel movements per mom 2.  Nausea and vomiting: Patient is self inducing vomiting, also question relationship to above 3.  Hypokalemia  Plan: 1.  Patient has pulled his NG tube and will likely not let us give him any further prep.  Started Linzess 290 mcg qd- this should be continued outpatient-can open capsule and sprinkle granules in food-discussed with mom 2.  Mom is worried about his weakness, potassium does look  like it is dropped today, will leave to the hospitalist to correct 3.  Please await further recommendations from Dr. Meridee Score later today  Thank you for kind consultation, we will sign off.   LOS: 1 day   Unk Lightning  11/22/2018, 9:25 AM

## 2018-11-23 ENCOUNTER — Telehealth: Payer: Self-pay

## 2018-11-23 ENCOUNTER — Inpatient Hospital Stay (HOSPITAL_COMMUNITY): Payer: Medicaid Other

## 2018-11-23 LAB — CBC
HCT: 39.7 % (ref 39.0–52.0)
Hemoglobin: 12.7 g/dL — ABNORMAL LOW (ref 13.0–17.0)
MCH: 27.5 pg (ref 26.0–34.0)
MCHC: 32 g/dL (ref 30.0–36.0)
MCV: 85.9 fL (ref 80.0–100.0)
Platelets: 336 10*3/uL (ref 150–400)
RBC: 4.62 MIL/uL (ref 4.22–5.81)
RDW: 12.5 % (ref 11.5–15.5)
WBC: 6.1 10*3/uL (ref 4.0–10.5)
nRBC: 0 % (ref 0.0–0.2)

## 2018-11-23 LAB — COMPREHENSIVE METABOLIC PANEL
ALT: 93 U/L — ABNORMAL HIGH (ref 0–44)
AST: 53 U/L — ABNORMAL HIGH (ref 15–41)
Albumin: 3.9 g/dL (ref 3.5–5.0)
Alkaline Phosphatase: 67 U/L (ref 38–126)
Anion gap: 13 (ref 5–15)
BUN: 7 mg/dL (ref 6–20)
CO2: 24 mmol/L (ref 22–32)
Calcium: 9.3 mg/dL (ref 8.9–10.3)
Chloride: 100 mmol/L (ref 98–111)
Creatinine, Ser: 0.54 mg/dL — ABNORMAL LOW (ref 0.61–1.24)
GFR calc Af Amer: >60 mL/min (ref >60)
GFR calc non Af Amer: >60 mL/min (ref >60)
Glucose, Bld: 96 mg/dL (ref 70–99)
Potassium: 3.7 mmol/L (ref 3.5–5.1)
Sodium: 137 mmol/L (ref 135–145)
Total Bilirubin: 1.2 mg/dL (ref 0.3–1.2)
Total Protein: 6.9 g/dL (ref 6.5–8.1)

## 2018-11-23 LAB — T3, FREE: T3, Free: 3.3 pg/mL (ref 2.0–4.4)

## 2018-11-23 IMAGING — DX DG ABD PORTABLE 1V
2 series · 2 of 2 positions shown · non-contrast
Comparison: [DATE].

CLINICAL DATA: Colonic impaction.

EXAM:
PORTABLE ABDOMEN - 1 VIEW

[abdomen kub (1 of 2)]
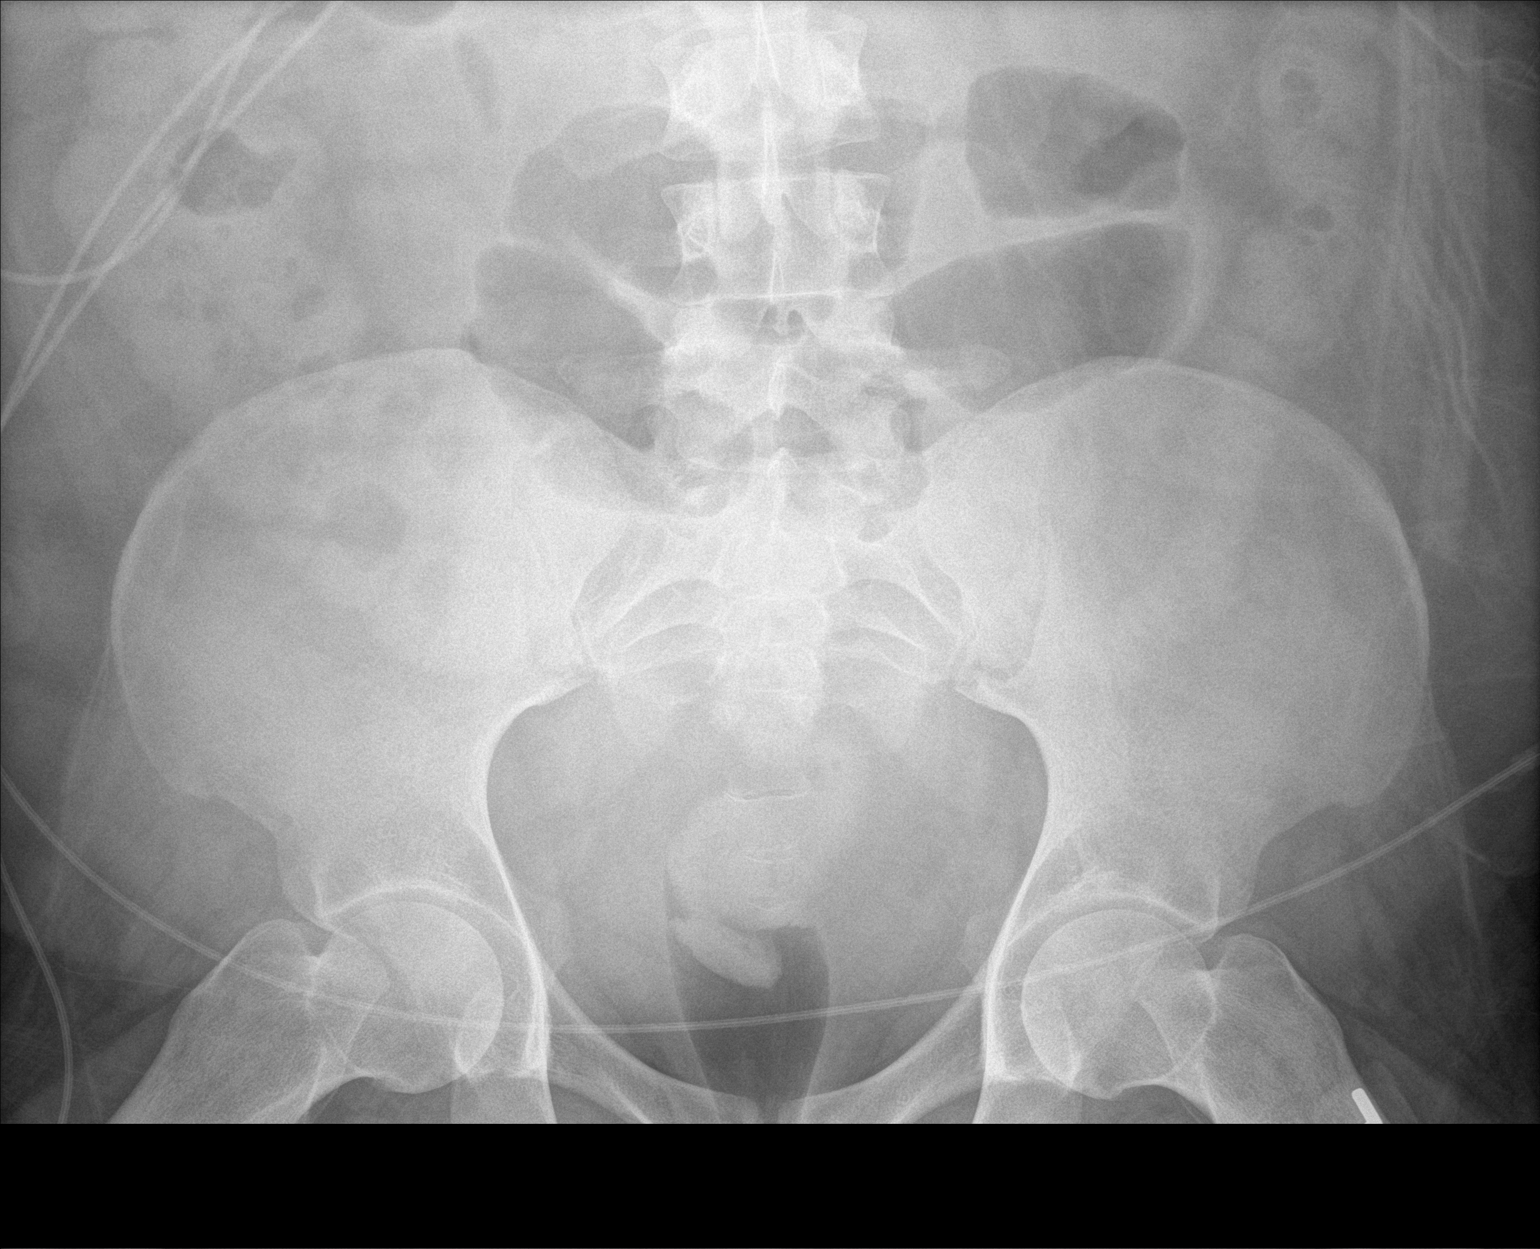

[abdomen kub (2 of 2)]
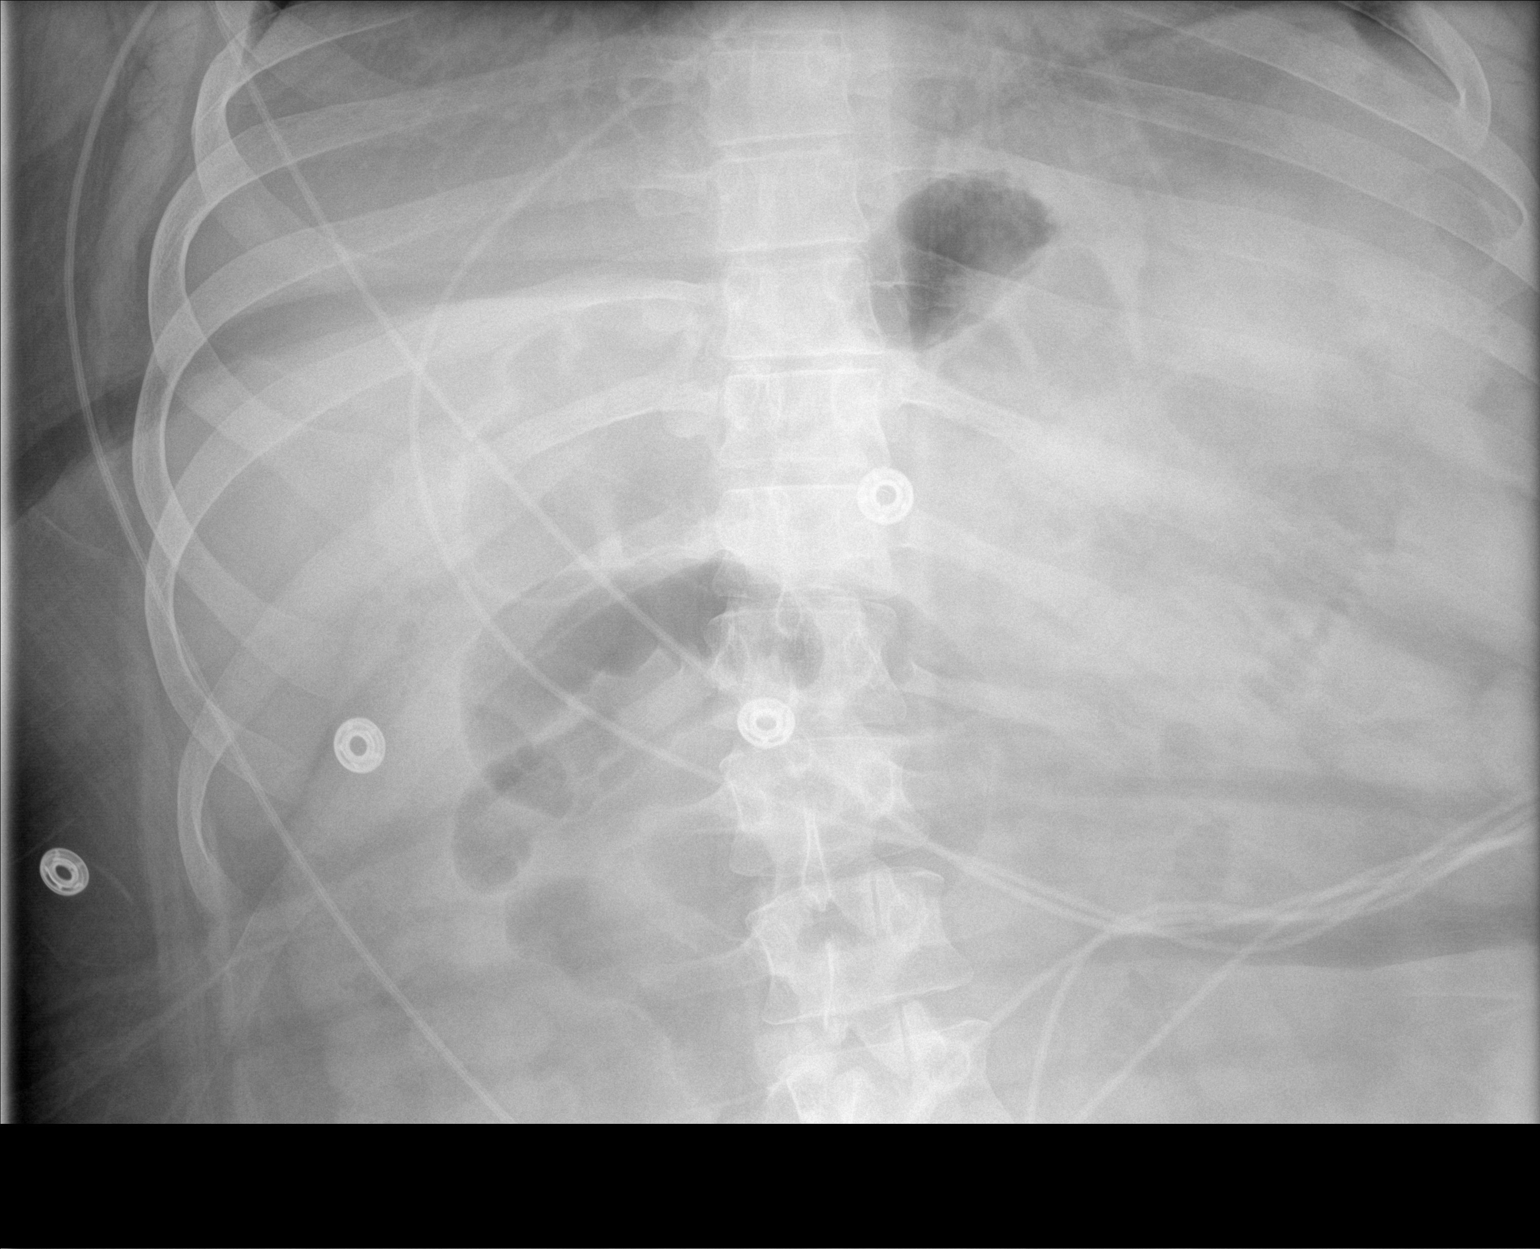

[2 of 2 positions shown; findings below may reference images not displayed]

FINDINGS: The bowel gas pattern is normal. No radio-opaque calculi or other
significant radiographic abnormality are seen.
IMPRESSION: No evidence of bowel obstruction or ileus. No significant stool
burden is noted.

## 2018-11-23 IMAGING — US US ABDOMEN LIMITED
1 series · 14 of 25 positions shown · non-contrast
Comparison: None.

CLINICAL DATA: Right upper quadrant pain 4 weeks.

EXAM:
ULTRASOUND ABDOMEN LIMITED RIGHT UPPER QUADRANT

[Series 1: us abdomen limited · 14 of 39 slices shown]
[im 1/39]
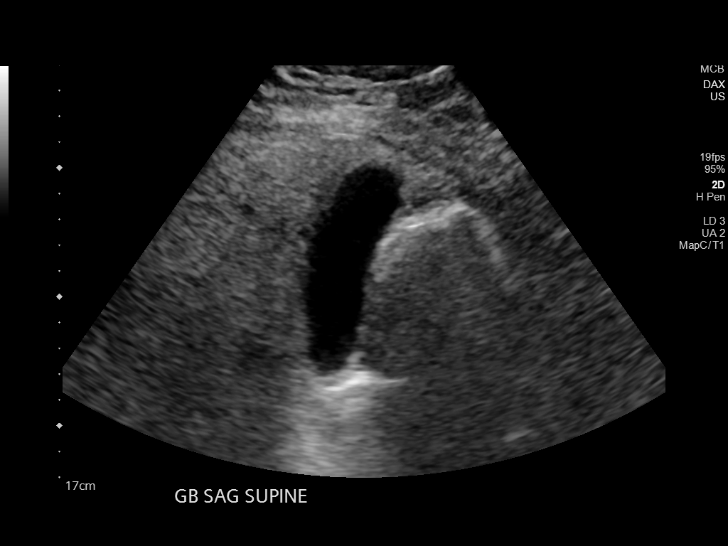
[im 4/39]
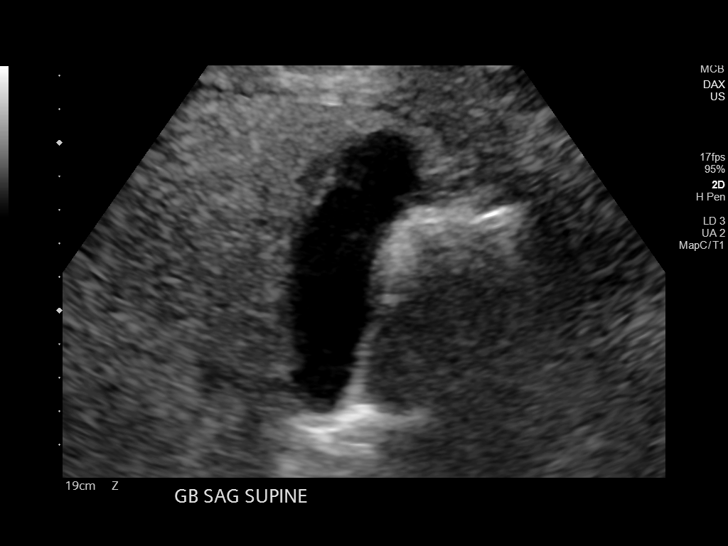
[im 7/39]
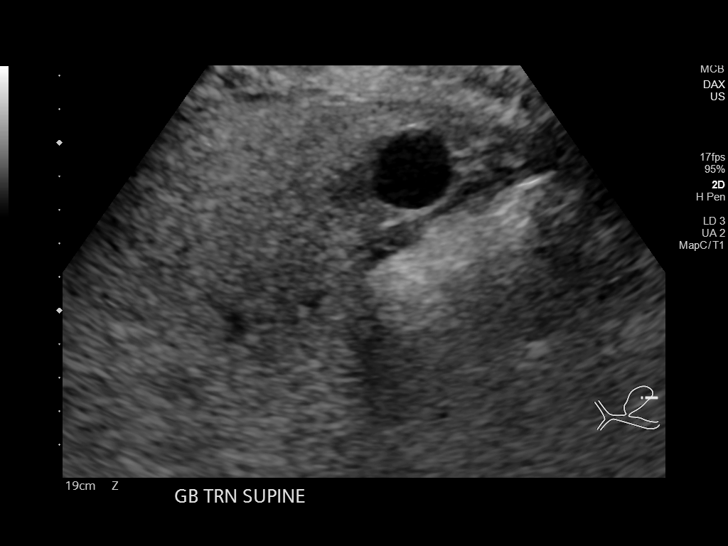
[im 10/39]
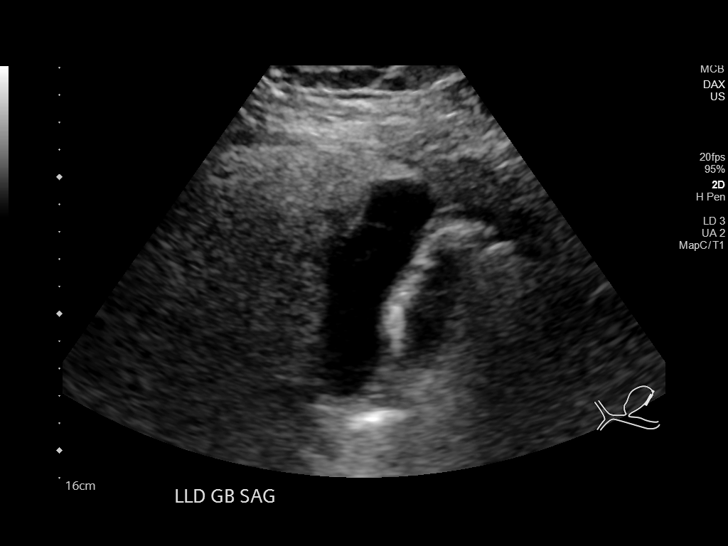
[im 13/39]
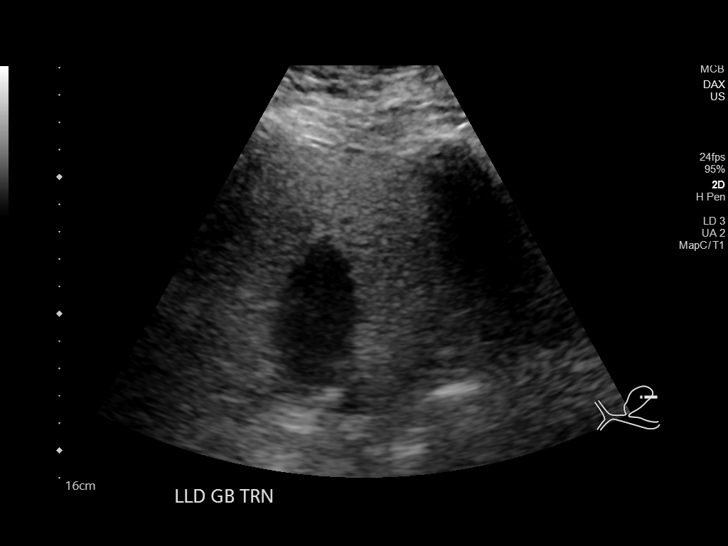
[im 15/39]
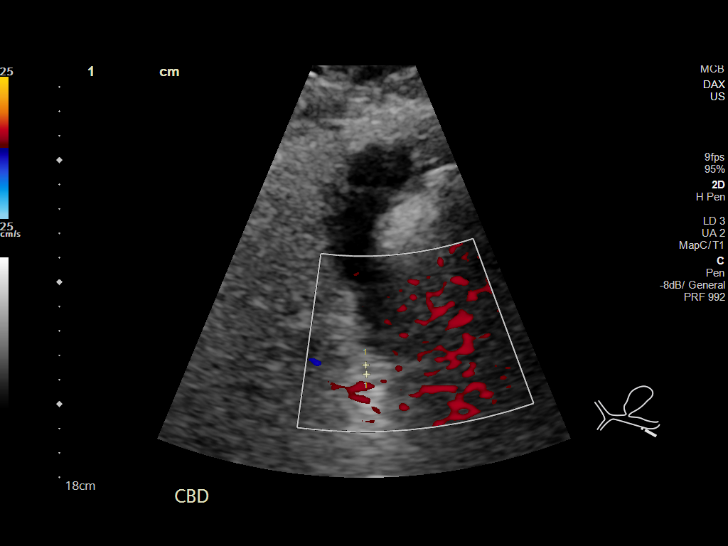
[im 18/39]
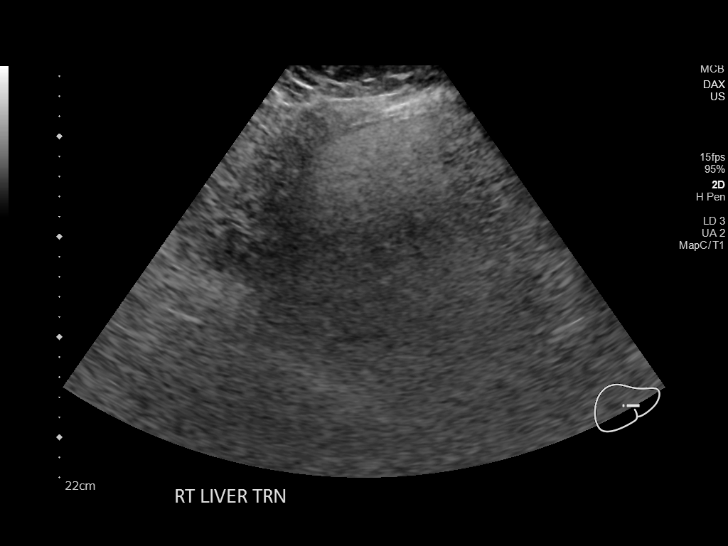
[im 21/39]
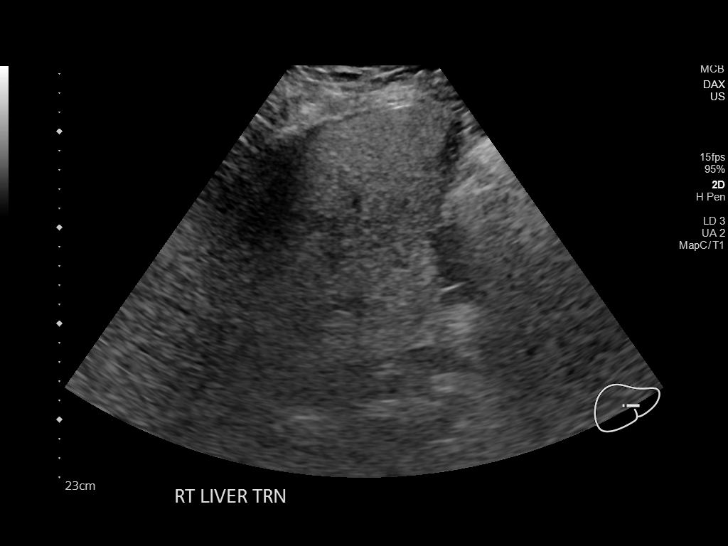
[im 24/39]
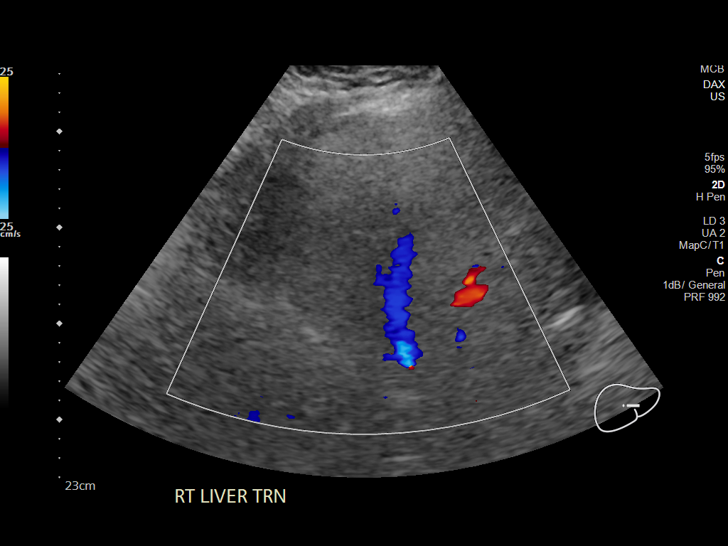
[im 26/39]
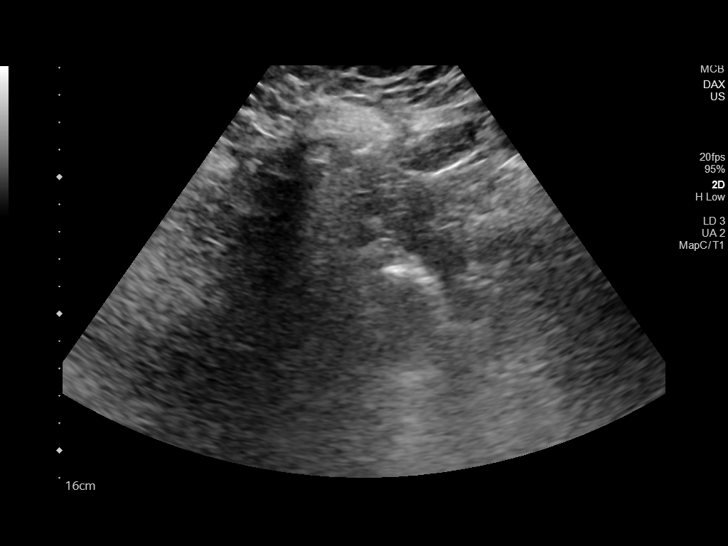
[im 29/39]
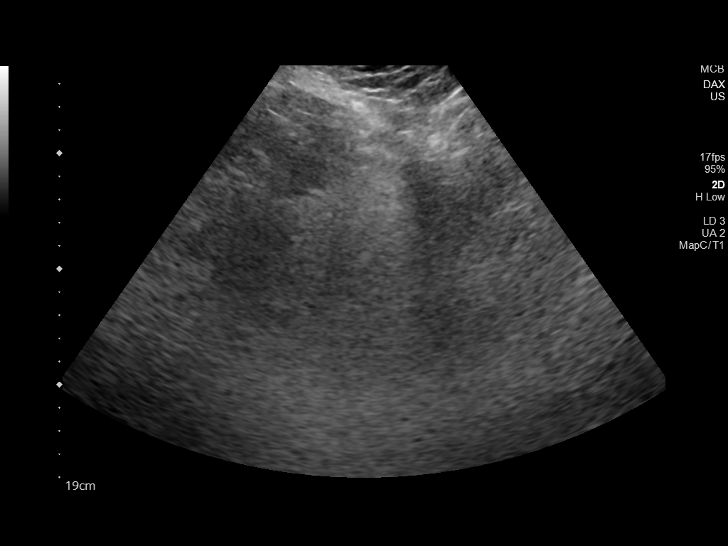
[im 32/39]
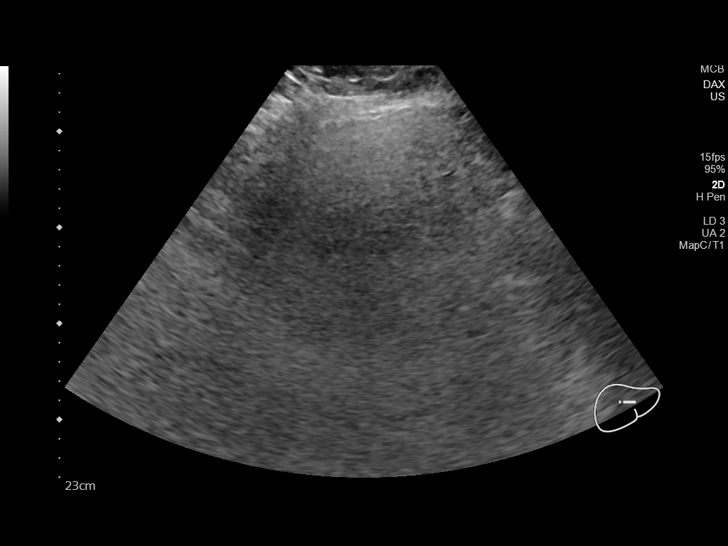
[im 35/39]
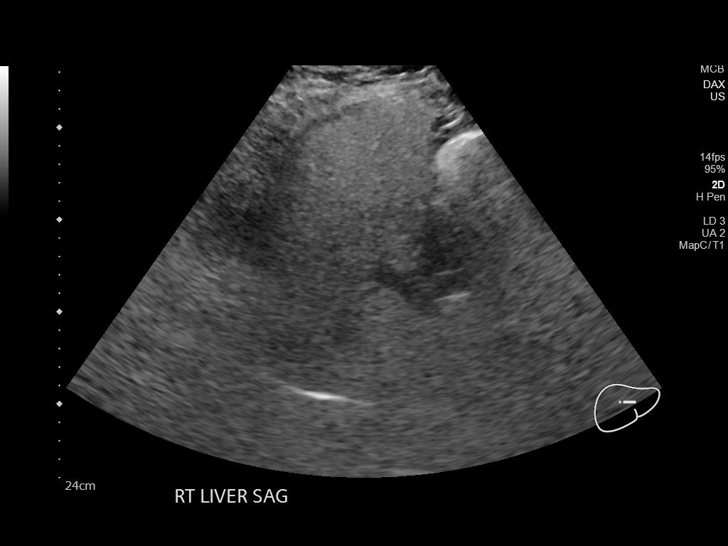
[im 39/39]
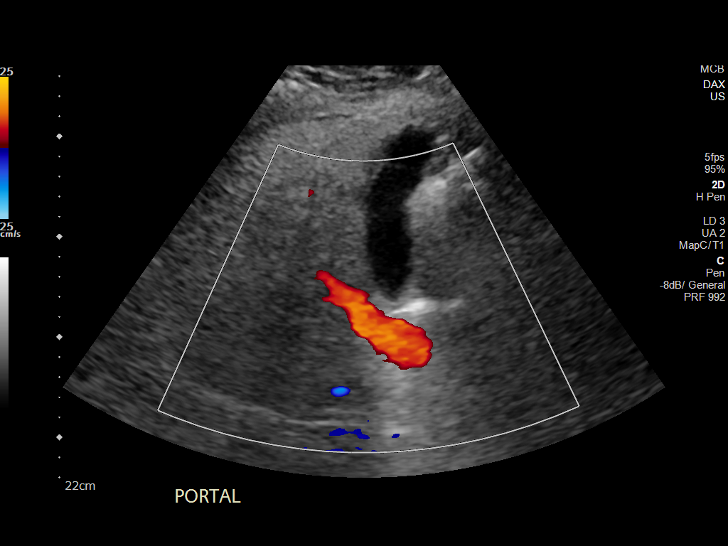

[14 of 25 positions shown; findings below may reference images not displayed]

FINDINGS: Gallbladder:

No gallstones or wall thickening visualized. No sonographic Murphy
sign noted by sonographer.

Common bile duct:

Diameter: 3.8 mm.

Liver:

Increased parenchymal echogenicity compatible with steatosis without
focal mass. Portal vein is patent on color Doppler imaging with
normal direction of blood flow towards the liver. Note that the
entire liver is not adequately visualized due to patient body
habitus.

Other: None.
IMPRESSION: No acute findings.

Suggestion of hepatic steatosis without focal mass.

## 2018-11-23 MED ORDER — LEVOTHYROXINE SODIUM 25 MCG PO TABS
25.0000 ug | ORAL_TABLET | Freq: Every day | ORAL | Status: DC
  Administered 2018-11-26 – 2018-11-27 (×2): 25 ug via ORAL
  Filled 2018-11-23 (×4): qty 1

## 2018-11-23 NOTE — Progress Notes (Signed)
PROGRESS NOTE    Aaron CapCameron Kampe  UXL:[id number] DOB: 03/21/1995 DOA: 11/20/2018 [facility]   Brief Narrative: Aaron Vega is a 23 y.o. male with a history of autism, seizure disorder, obesity. Patient presented secondary to constipation and decreased mobility. Concern for severe constipation/impaction. GI consulted for management.   Assessment & Plan:   Active Problems:   Constipation   Abnormal LFTs   Hyperglycemia   Obesity, Class III, BMI 40-49.9 (morbid obesity) (HCC)   Constipation/impaction Clinically significant. No BM in several weeks per mother. Imaging consistent with mild stool burden from recent CT performed at OSH in addition to abdominal x-ray performed on admission. GI consulted and is planning on flexible sigmoidoscopy for management. Patient not adherent with medication management secondary to behavioral diagnoses. Flexible sigmoidoscopy performed on 10/28 and significant for stool in the entire examined colon; lavage performed. NG placed with bowel prep administered but now NG tube pulled out. No recurrent bowel movements. -GI recommendations: bowel regimen, colonoscopy at some point, will require cecostomy tube if recurrent issues arise -Repeat abdominal x-ray  Weakness Patient had a witness/controlled fall overnight. No neurological deficits noted on exam. Weakness has been progressing over the past 4+ weeks per mother. -PT evaluation pending  Elevated TSH On chart review, he has had a recently normal free T4 with elevated TSH. It is possible he has subclinical hypothyroidism. Normal free T3/T4. -Will start Synthroid 25 mcg daily and have patient follow-up with PCP  Seizure disorder Patient with a seizure on 10/27. Patient has not received his AED medication secondary to recent vomiting. Seizure resolved with Versed. Currently baseline -Continue clobazam, Vimpat -Continue diazepam and midazolam prn  Elevated AST/ALT  Likely secondary to AED medications. Recent CT scan without liver/gallbladder pathology. RUQ ultrasound shows hepatic steatosis with no ductal dilation or gallbladder pathology.  Obesity Body mass index is 48.44 kg/m.   Hyperglycemia Mild.  Essential hypertension Patient is on lisinopril as an outpatient. BP is currently elevated. -Continue lisinopril 25 mg daily  Autism/Behavioral -Continue Celexa -Continue diazepam prn   DVT prophylaxis: SCDs Code Status:   Code Status: Full Code Family Communication: Mother at bedside Disposition Plan: Discharge possibly tomorrow if patient has a bowel movement   Consultants:   [facility]  Procedures:   None  Antimicrobials:  None    Subjective: No issues overnight.   Objective: Vitals:   11/22/18 1832 11/22/18 2141 11/23/18 0204 11/23/18 0610  BP: (!) 145/80 (!) 138/99 (!) 146/96 (!) 147/96  Pulse: (!) 118 (!) 114 (!) 113 (!) 116  Resp: 19 18 16 16   Temp: 100.3 F (37.9 C) 99.8 F (37.7 C) 99.3 F (37.4 C) 98.9 F (37.2 C)  TempSrc: Axillary Oral Oral Oral  SpO2: 98% 94% 95% 96%  Weight:      Height:        Intake/Output Summary (Last 24 hours) at 11/23/2018 1338 Last data filed at 11/22/2018 1500 Gross per 24 hour  Intake 395 ml  Output 775 ml  Net -380 ml   Filed Weights   11/21/18 1136  Weight: (!) 148.8 kg    Examination:  General exam: Appears calm and comfortable Respiratory system: Clear to auscultation. Respiratory effort normal. Cardiovascular system: S1 & S2 heard, RRR. No murmurs, rubs, gallops or clicks. Gastrointestinal system: Abdomen is soft and possibly tender diffusely (patient guards his whole abdomen with his hand). Normal bowel sounds heard. Central nervous system: Alert. No focal neurological deficits. Extremities: No edema. No calf tenderness Skin: No cyanosis.  No rashes Psychiatry: Non-verbal    Data Reviewed: I have personally reviewed following labs and imaging studies   CBC: Recent Labs  Lab 11/20/18 1436 11/23/18 0917  WBC 8.2 6.1  NEUTROABS 5.3  --   HGB 14.1 12.7*  HCT 43.1 39.7  MCV 85.0 85.9  PLT 389 696   Basic Metabolic Panel: Recent Labs  Lab 11/20/18 1436 11/21/18 1519 11/23/18 0917  NA 137 135 137  K 3.9 3.2* 3.7  CL 99 100 100  CO2 25 24 24   GLUCOSE 109* 104* 96  BUN 9 9 7   CREATININE 0.73 0.66 0.54*  CALCIUM 9.7 9.0 9.3  MG  --  1.8  --    GFR: Estimated Creatinine Clearance: 210.5 mL/min (A) (by C-G formula based on SCr of 0.54 mg/dL (L)). Liver Function Tests: Recent Labs  Lab 11/20/18 1436 11/21/18 1519 11/23/18 0917  AST 75* 50* 53*  ALT 112* 91* 93*  ALKPHOS 78 64 67  BILITOT 1.4* 0.9 1.2  PROT 7.8 6.7 6.9  ALBUMIN 4.6 3.9 3.9   Recent Labs  Lab 11/21/18 1519  LIPASE 25   No results for input(s): AMMONIA in the last 168 hours. Coagulation Profile: No results for input(s): INR, PROTIME in the last 168 hours. Cardiac Enzymes: No results for input(s): CKTOTAL, CKMB, CKMBINDEX, TROPONINI in the last 168 hours. BNP (last 3 results) No results for input(s): PROBNP in the last 8760 hours. HbA1C: No results for input(s): HGBA1C in the last 72 hours. CBG: No results for input(s): GLUCAP in the last 168 hours. Lipid Profile: No results for input(s): CHOL, HDL, LDLCALC, TRIG, CHOLHDL, LDLDIRECT in the last 72 hours. Thyroid Function Tests: Recent Labs    11/21/18 1508 11/21/18 1519  TSH  --  10.332*  FREET4 0.99  --   T3FREE 3.3  --    Anemia Panel: No results for input(s): VITAMINB12, FOLATE, FERRITIN, TIBC, IRON, RETICCTPCT in the last 72 hours. Sepsis Labs: No results for input(s): PROCALCITON, LATICACIDVEN in the last 168 hours.  Recent Results (from the past 240 hour(s))  SARS CORONAVIRUS 2 (TAT 6-24 HRS) Nasopharyngeal Nasopharyngeal Swab     Status: None   Collection Time: 11/20/18  6:43 PM   Specimen: Nasopharyngeal Swab  Result Value Ref Range Status   SARS Coronavirus 2 NEGATIVE  NEGATIVE Final    Comment: (NOTE) SARS-CoV-2 target nucleic acids are NOT DETECTED. The SARS-CoV-2 RNA is generally detectable in upper and lower respiratory specimens during the acute phase of infection. Negative results do not preclude SARS-CoV-2 infection, do not rule out co-infections with other pathogens, and should not be used as the sole basis for treatment or other patient management decisions. Negative results must be combined with clinical observations, patient history, and epidemiological information. The expected result is Negative. Fact Sheet for Patients: SugarRoll.be Fact Sheet for Healthcare Providers: https://www.woods-mathews.com/ This test is not yet approved or cleared by the [place] FDA and  has been authorized for detection and/or diagnosis of SARS-CoV-2 by FDA under an Emergency Use Authorization (EUA). This EUA will remain  in effect (meaning this test can be used) for the duration of the COVID-19 declaration under Section 56 4(b)(1) of the Act, 21 U.S.C. section 360bbb-3(b)(1), unless the authorization is terminated or revoked sooner. Performed at [facility], [place] [address] [place], [place] [address]          Radiology Studies: Dg Abd 1 View  Result Date: 11/21/2018 CLINICAL DATA:  NG tube placement. EXAM: ABDOMEN -  1 VIEW COMPARISON:  Radiographs dated 11/20/2018 FINDINGS: NG tube is been inserted and the tip is looped in the fundus of the stomach. No dilated loops of large or small bowel. No visible free air or free fluid. No significant bone abnormality. IMPRESSION: Benign appearing abdomen. The tip of the tube is in the fundus of the stomach. Electronically Signed   By: Francene Boyers M.D.   On: 11/21/2018 14:25   Dg Chest Port 1 View  Result Date: 11/21/2018 CLINICAL DATA:  NG tube placement. EXAM: PORTABLE CHEST 1 VIEW COMPARISON:  Chest x-ray dated 11/03/2018 FINDINGS: NG tube is looped in  the fundus of the stomach. Heart size and pulmonary vascularity are normal. Lungs are clear. No acute bone abnormality. IMPRESSION: No acute abnormalities. NG tube is looped in the fundus of the stomach. Electronically Signed   By: Francene Boyers M.D.   On: 11/21/2018 14:24   US Abdomen Limited Ruq  Result Date: 11/23/2018 CLINICAL DATA:  Right upper quadrant pain 4 weeks. EXAM: ULTRASOUND ABDOMEN LIMITED RIGHT UPPER QUADRANT COMPARISON:  None. FINDINGS: Gallbladder: No gallstones or wall thickening visualized. No sonographic Murphy sign noted by sonographer. Common bile duct: Diameter: 3.8 mm. Liver: Increased parenchymal echogenicity compatible with steatosis without focal mass. Portal vein is patent on color Doppler imaging with normal direction of blood flow towards the liver. Note that the entire liver is not adequately visualized due to patient body habitus. Other: None. IMPRESSION: No acute findings. Suggestion of hepatic steatosis without focal mass. Electronically Signed   By: Elberta Fortis M.D.   On: 11/23/2018 11:15        Scheduled Meds: . bisacodyl  10 mg Oral BID  . citalopram  40 mg Oral Daily  . cloBAZam  60 mg Oral BID  . diazepam  7.5 mg Oral Daily  . linaclotide  290 mcg Oral QAC breakfast  . lisinopril  25 mg Oral Daily  . polyethylene glycol  17 g Oral BID   Continuous Infusions: . lacosamide (VIMPAT) IV 100 mg (11/22/18 2122)     LOS: 2 days     Jacquelin Hawking, MD [facility] 11/23/2018, 1:38 PM  If 7PM-7AM, please contact night-coverage [email or web address]

## 2018-11-23 NOTE — Anesthesia Postprocedure Evaluation (Signed)
Anesthesia Post Note  Patient: Aaron Vega  Procedure(s) Performed: FLEXIBLE SIGMOIDOSCOPY (N/A ) IMPACTION REMOVAL     Patient location during evaluation: Endoscopy Anesthesia Type: MAC Level of consciousness: awake and alert Pain management: pain level controlled Vital Signs Assessment: post-procedure vital signs reviewed and stable Respiratory status: spontaneous breathing, nonlabored ventilation, respiratory function stable and patient connected to nasal cannula oxygen Cardiovascular status: blood pressure returned to baseline and stable Postop Assessment: no apparent nausea or vomiting Anesthetic complications: no    Last Vitals:  Vitals:   11/23/18 0204 11/23/18 0610  BP: (!) 146/96 (!) 147/96  Pulse: (!) 113 (!) 116  Resp: 16 16  Temp: 37.4 C 37.2 C  SpO2: 95% 96%    Last Pain:  Vitals:   11/23/18 0610  TempSrc: Oral  PainSc:                  [place]

## 2018-11-23 NOTE — Progress Notes (Signed)
Per pt mother, he is not able to keep food or liquid down.  He has been restless this afternoon even after receiving ativan IV.  This nurse did see a towel with scant amount of bright red splotches after pt tried to drink fluids.

## 2018-11-23 NOTE — Evaluation (Signed)
Physical Therapy Evaluation Patient Details Name: Aaron Vega MRN: [id number] DOB: 12-27-1995 Today's Date: 11/23/2018   History of Present Illness  Pt is 23 yo male with medical history significant of severe autism, nonverbal, morbid obesity, and seizures.  He has had constipation for 4 weeks.  Pt s/p flexible sigmoidscopy with disimpaction on 10/28.  Pt had NG tube that he removed on 10/28.  Pt had a fall on 10/29.  Clinical Impression  Pt presents with impairments below (see PT Problem List).  Evaluation and therapy will be largely limited by pt's willingness to participate - pt with severe autism.  Additionally, pt's safety limited by inability to convey wants, impulses, or symptoms.  He will need assist of 2 and recommend close chair follow if ambulating (consider bari stedy to chair then chair follow as pt was unable to back to bed at eval and bed had to be slide closer). Pt does present with mobility significantly below baseline and will benefit from skilled PT services.  Mother declines rehab or SNF, due to wanting to take her son home and feels he would do better in his own environment - which is likely true. With current level of mobility they would need w/c at d/c in order to promote safe mobility.  Additionally, BSC and wider RW for mobility.     Follow Up Recommendations Home health PT(Family declined SNF/rehab -will provide 24 hr support and need DME)    Equipment Recommendations  Wheelchair (measurements PT);3in1 (PT);Other (comment)(wide RW)    Recommendations for Other Services       Precautions / Restrictions Precautions Precautions: Fall      Mobility  Bed Mobility Overal bed mobility: Needs Assistance Bed Mobility: Rolling;Supine to Sit;Sit to Supine Rolling: Mod assist   Supine to sit: Supervision Sit to supine: Mod assist;+2 for physical assistance   General bed mobility comments: Pt was at EOB upon arrival - mother had stepped out of the room and he sat up  (bed alarm was not on) - he needs supervision with this for safety.   Ability to transfer was largely limited by pt's willingness to follow commands  Transfers Overall transfer level: Needs assistance Equipment used: Rolling walker (2 wheeled) Transfers: Sit to/from Stand Sit to Stand: Min assist;+2 physical assistance;+2 safety/equipment;From elevated surface         General transfer comment: First sit to stand with min A x 2; but pt would not attempt again despite encouragement  Ambulation/Gait Ambulation/Gait assistance: Min assist;+2 safety/equipment;+2 physical assistance Gait Distance (Feet): 2 Feet Assistive device: Rolling walker (2 wheeled) Gait Pattern/deviations: Steppage;Shuffle     General Gait Details: Pt took 3 small steps with encouragement from mother.  He then appeared to fatigue and bed had to be slide closer for him to sit.  Pt unable to express fatigue or symptoms.  Stairs            Wheelchair Mobility    Modified Rankin (Stroke Patients Only)       Balance Overall balance assessment: Needs assistance Sitting-balance support: Bilateral upper extremity supported;Feet supported Sitting balance-[name]: Fair     Standing balance support: Bilateral upper extremity supported Standing balance-[name]: Fair                               Pertinent Vitals/Pain Pain Assessment: Faces Faces Pain Scale: No hurt    Home Living Family/patient expects to be discharged to:: Private residence Living  Arrangements: Parent Available Help at Discharge: Family Type of Home: House Home Access: Stairs to enter Entrance Stairs-Rails: None Entrance Stairs-Number of Steps: Pt with 3 platform like steps (up onto curb, 1 onto porch, 1 into house) Home Layout: Two level;Able to live on main level with bedroom/bathroom        Prior Function Level of Independence: Needs assistance         Comments: Pt was independent/supervision with ADLs  and gait.  Mother provided history - reports pt could ambulate without difficulty up until about 4 weeks ago.  States steady decline since that time.     Hand Dominance        Extremity/Trunk Assessment   Upper Extremity Assessment Upper Extremity Assessment: Difficult to assess due to impaired cognition(Pt's ROM WFL but unable to follow MMT commands.)    Lower Extremity Assessment Lower Extremity Assessment: Difficult to assess due to impaired cognition(Pt's ROM WFL but unable to follow MMT commands.)       Communication   Communication: Expressive difficulties(pt non verbal)  Cognition Arousal/Alertness: Awake/alert Behavior During Therapy: Impulsive Overall Cognitive Status: Impaired/Different from baseline Area of Impairment: Following commands                       Following Commands: Follows one step commands inconsistently       General Comments: Pt with severe autism, unable to assess orientation.  He followed commands ~10% of time.      General Comments General comments (skin integrity, edema, etc.): Pt's mother and grandmother present and helpful in getting pt to participate as able.    Exercises     Assessment/Plan    PT Assessment Patient needs continued PT services  PT Problem List Decreased strength;Decreased mobility;Decreased safety awareness;Decreased activity tolerance;Decreased cognition;Decreased balance;Decreased knowledge of use of DME       PT Treatment Interventions DME instruction;Therapeutic exercise;Gait training;Balance training;Functional mobility training;Therapeutic activities;Patient/family education    PT Goals (Current goals can be found in the Care Plan section)  Acute Rehab PT Goals Patient Stated Goal: Family states take pt home PT Goal Formulation: With patient/family Time For Goal Achievement: 12/07/18 Potential to Achieve Goals: Fair    Frequency Min 3X/week   Barriers to discharge Other (comment) autism, fall  risk    Co-evaluation               AM-PAC PT "6 Clicks" Mobility  Outcome Measure Help needed turning from your back to your side while in a flat bed without using bedrails?: A Lot Help needed moving from lying on your back to sitting on the side of a flat bed without using bedrails?: A Lot Help needed moving to and from a bed to a chair (including a wheelchair)?: Total Help needed standing up from a chair using your arms (e.g., wheelchair or bedside chair)?: A Lot Help needed to walk in hospital room?: A Lot Help needed climbing 3-5 steps with a railing? : Total 6 Click Score: 10    End of Session Equipment Utilized During Treatment: Gait belt Activity Tolerance: Patient limited by fatigue;Treatment limited secondary to agitation Patient left: in bed;with call bell/phone within reach;with bed alarm set;with family/visitor present Nurse Communication: Mobility status PT Visit Diagnosis: Unsteadiness on feet (R26.81);Other abnormalities of gait and mobility (R26.89);Muscle weakness (generalized) (M62.81);History of falling (Z91.81)    Time: [id number] PT Time Calculation (min) (ACUTE ONLY): 29 min   Charges:   PT Evaluation $PT Eval Moderate Complexity: 1 Mod PT  Treatments $Therapeutic Activity: 8-22 mins        Maggie Font, PT Acute Rehab Services [phone]   Karlton Lemon 11/23/2018, 2:19 PM

## 2018-11-23 NOTE — Telephone Encounter (Signed)
Called and spoke with patient's mom-verified DPR-scheduled patient for a new patient appt on 12/10/2018 at 1:30 pm; [name] advised to call back to the office at [phone] should questions/concerns arise;  [name] verbalized understanding of information/instructions;

## 2018-11-23 NOTE — Telephone Encounter (Signed)
-----   Message from Irving Copas., MD sent at 11/23/2018  2:15 AM EDT ----- Regarding: Follow-up Aaron Vega,Please reschedule follow-up for this patient with Dr. Lyndel Safe.Hopefully, after our fecal disimpaction and aggressive bowel regimen he will keep things moving in his GI tract.  I would call next week to help with scheduling. Thanks.GM

## 2018-11-24 ENCOUNTER — Inpatient Hospital Stay (HOSPITAL_COMMUNITY): Payer: Medicaid Other

## 2018-11-24 LAB — COMPREHENSIVE METABOLIC PANEL
ALT: 87 U/L — ABNORMAL HIGH (ref 0–44)
AST: 44 U/L — ABNORMAL HIGH (ref 15–41)
Albumin: 4.1 g/dL (ref 3.5–5.0)
Alkaline Phosphatase: 68 U/L (ref 38–126)
Anion gap: 15 (ref 5–15)
BUN: 10 mg/dL (ref 6–20)
CO2: 23 mmol/L (ref 22–32)
Calcium: 9.5 mg/dL (ref 8.9–10.3)
Chloride: 99 mmol/L (ref 98–111)
Creatinine, Ser: 0.48 mg/dL — ABNORMAL LOW (ref 0.61–1.24)
GFR calc Af Amer: >60 mL/min (ref >60)
GFR calc non Af Amer: >60 mL/min (ref >60)
Glucose, Bld: 96 mg/dL (ref 70–99)
Potassium: 3.6 mmol/L (ref 3.5–5.1)
Sodium: 137 mmol/L (ref 135–145)
Total Bilirubin: 1.9 mg/dL — ABNORMAL HIGH (ref 0.3–1.2)
Total Protein: 7 g/dL (ref 6.5–8.1)

## 2018-11-24 LAB — LIPASE, BLOOD: Lipase: 32 U/L (ref 11–51)

## 2018-11-24 IMAGING — CT CT ABD-PELV W/O CM
2 of 4 series · 15 of 46 positions shown, 17 images · non-contrast
Comparison: [DATE]

CLINICAL DATA: Constipation. Possible fecal impaction. Two episodes
of diarrhea today.

EXAM:
CT ABDOMEN AND PELVIS WITHOUT CONTRAST
TECHNIQUE: Multidetector CT imaging of the abdomen and pelvis was performed
following the standard protocol without IV contrast.

[Series 2: axial st · axial · 0.98mm/px · z∈[+1061,+1611]mm · 12 of 124 slices shown, 14 images]
[im 7/124  soft-tissue]
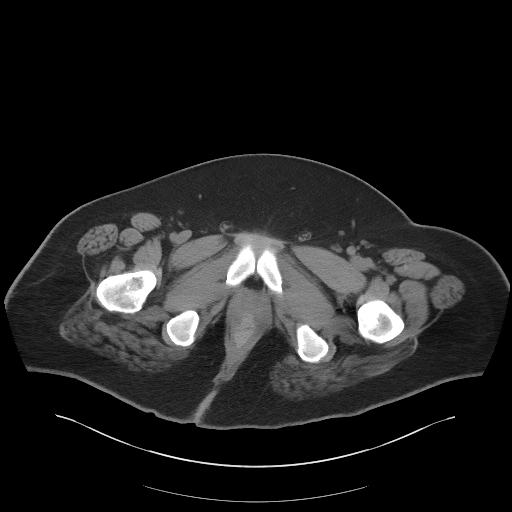
[im 7/124  bone]
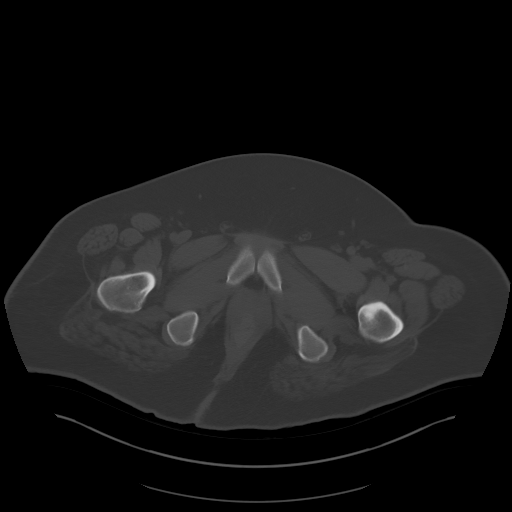
[im 21/124  soft-tissue]
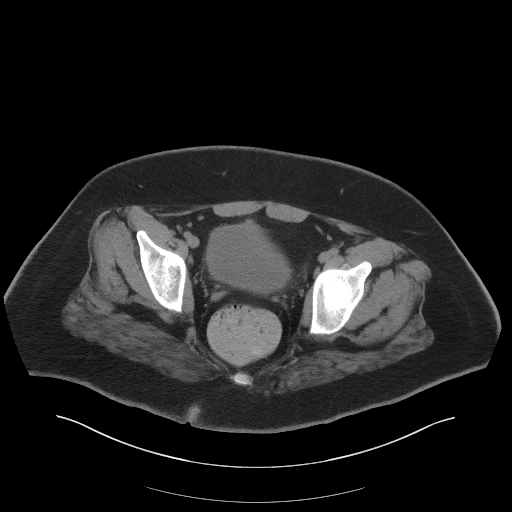
[im 28/124  soft-tissue]
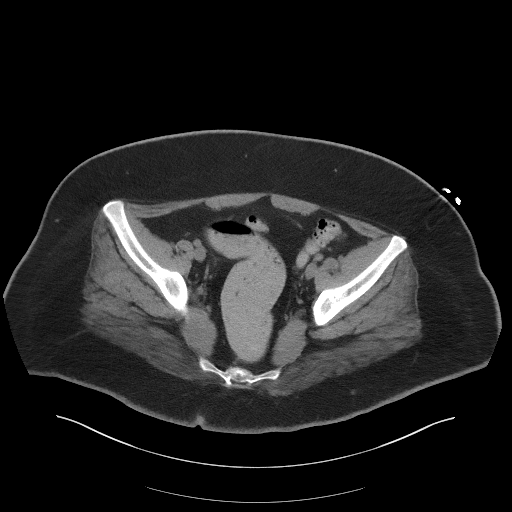
[im 35/124  soft-tissue]
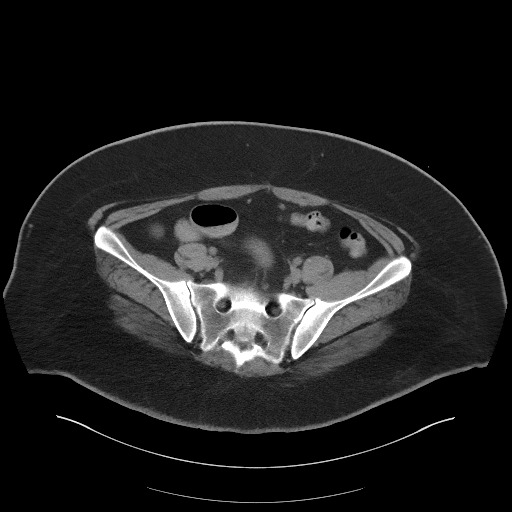
[im 48/124  soft-tissue]
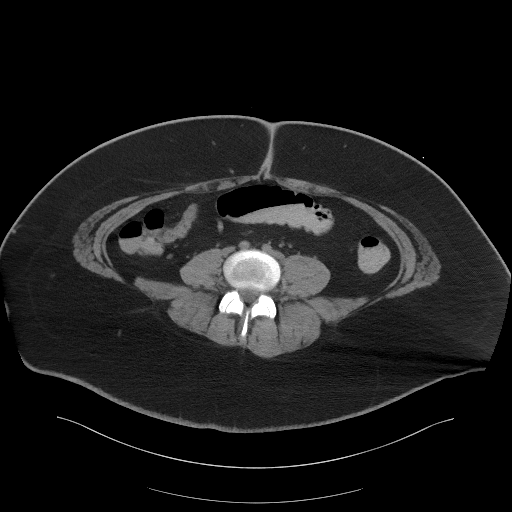
[im 55/124  soft-tissue]
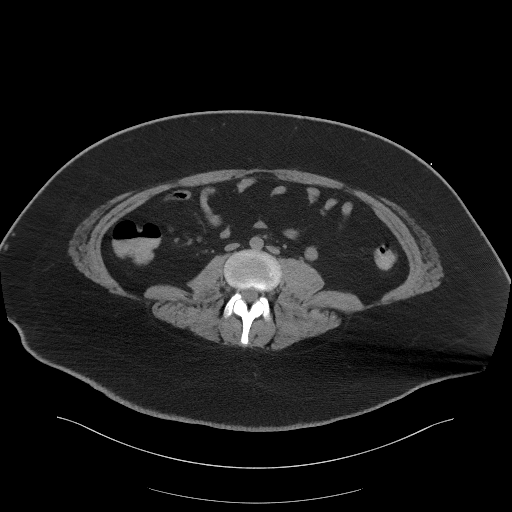
[im 69/124  soft-tissue]
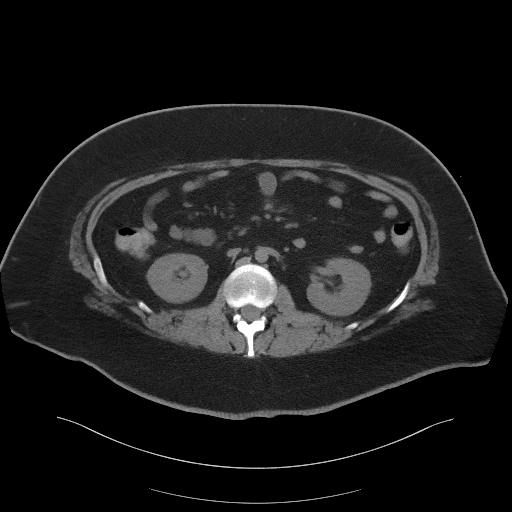
[im 76/124  soft-tissue]
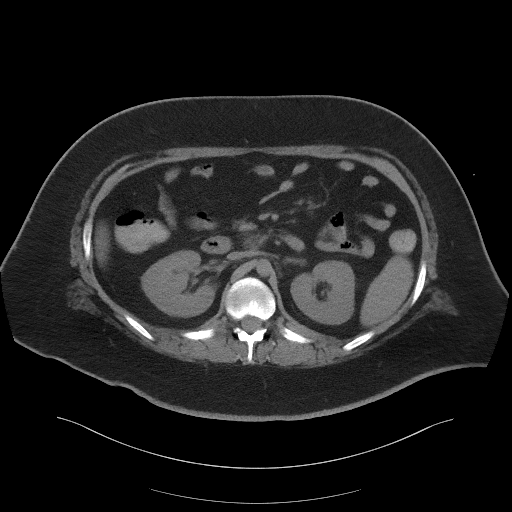
[im 89/124  soft-tissue]
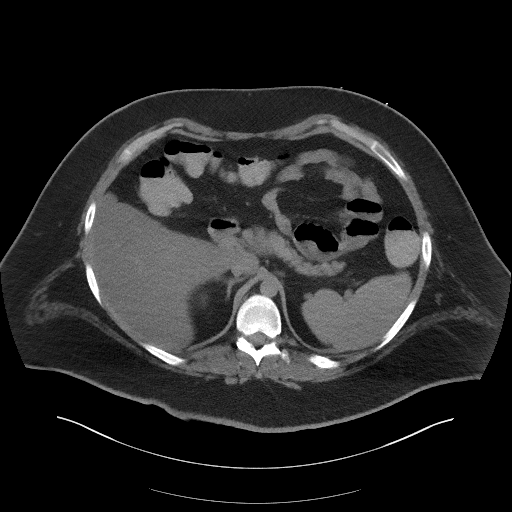
[im 89/124  bone]
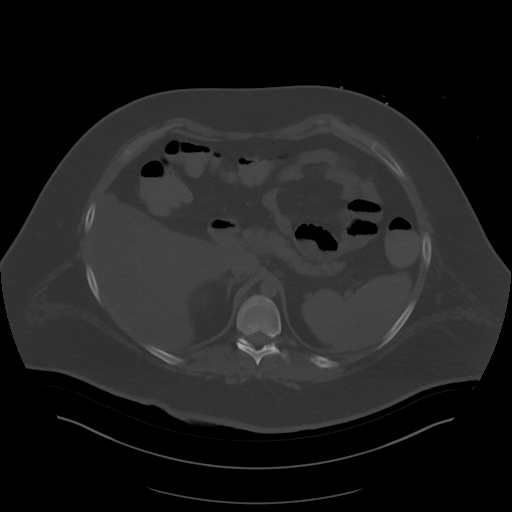
[im 96/124  soft-tissue]
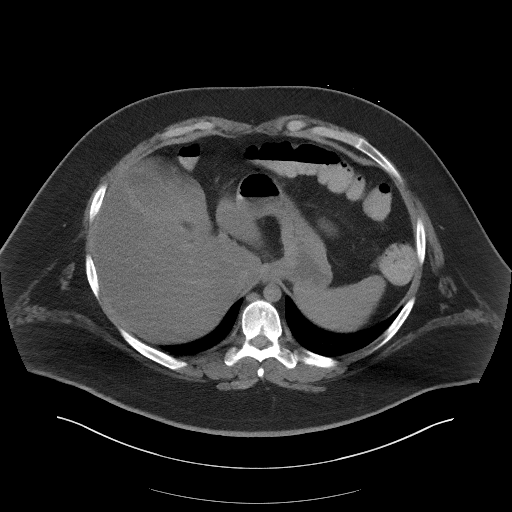
[im 103/124  soft-tissue]
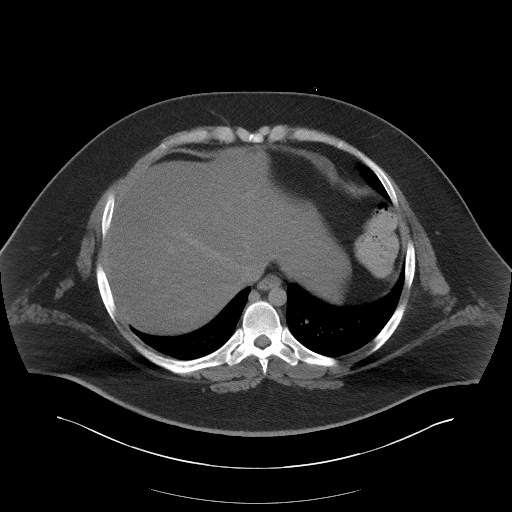
[im 117/124  soft-tissue]
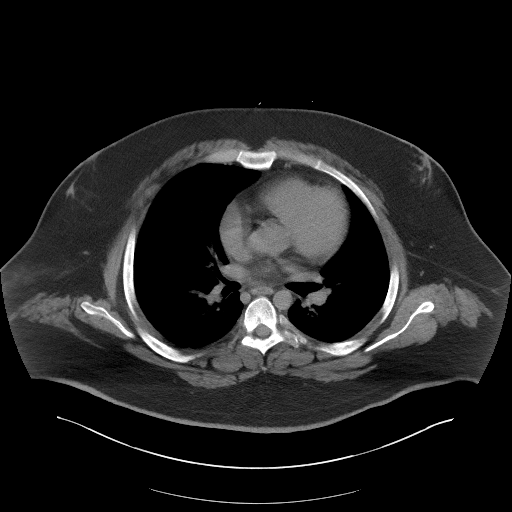

[Series 5: coronal st · coronal · 0.88mm/px · 3 of 101 slices shown]
[im 34/101  soft-tissue]
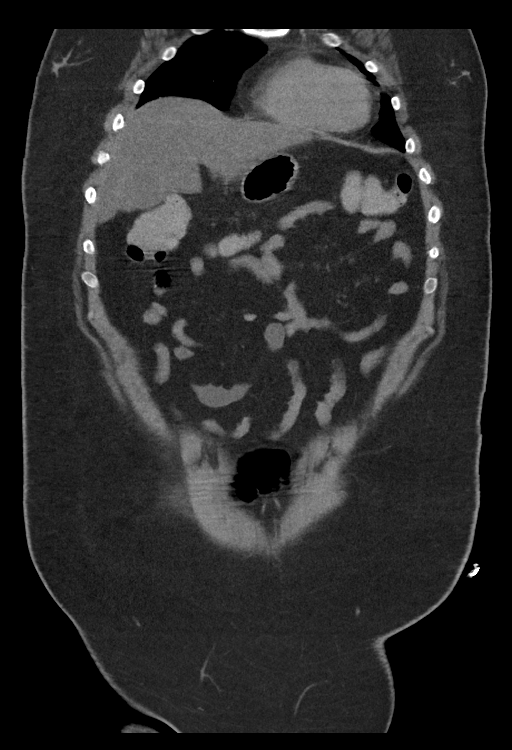
[im 45/101  soft-tissue]
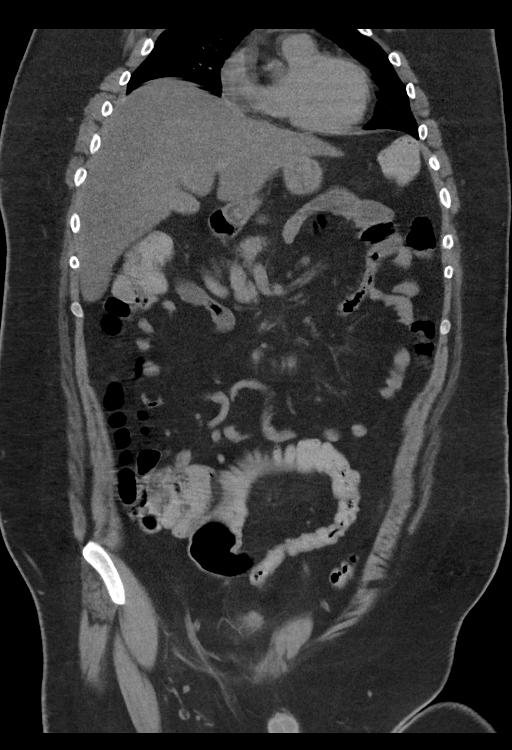
[im 56/101  soft-tissue]
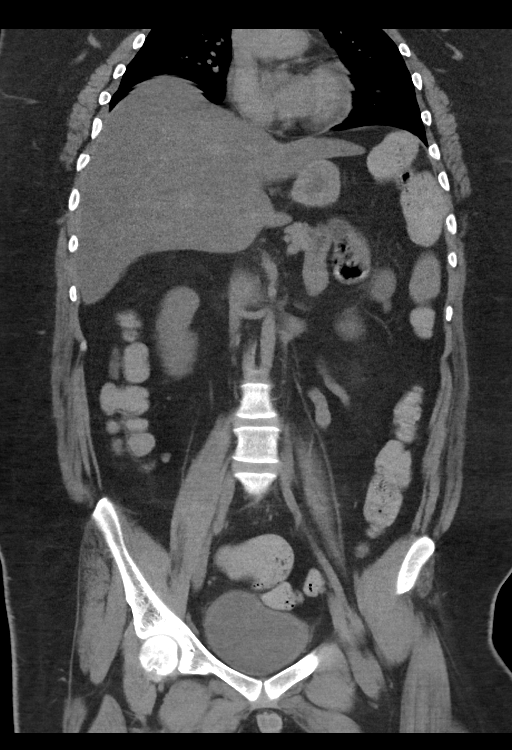

[15 of 46 positions shown; findings below may reference images not displayed]

FINDINGS: Lower chest: Clear lung bases.  Heart normal in size.

Hepatobiliary: Decreased attenuation of the liver consistent with
fatty infiltration. No liver mass or focal lesion. Normal
gallbladder. No bile duct dilation.

Pancreas: Unremarkable. No pancreatic ductal dilatation or
surrounding inflammatory changes.

Spleen: Normal in size without focal abnormality.

Adrenals/Urinary Tract: Adrenal glands are unremarkable. Kidneys are
normal, without renal calculi, focal lesion, or hydronephrosis.
Bladder is unremarkable.

Stomach/Bowel: Rectum and lower sigmoid colon are mildly distended
with stool. Stool is mostly liquid the 2 air-fluid level in the
sigmoid colon. There are additional air-fluid levels throughout the
colon. No colonic wall thickening. No inflammation.

Normal stomach. Small bowel is normal in caliber. No wall thickening
or inflammation. Normal appendix visualized.

Vascular/Lymphatic: No significant vascular findings are present. No
enlarged abdominal or pelvic lymph nodes.

Reproductive: Unremarkable.

Other: No abdominal wall hernia or abnormality. No abdominopelvic
ascites.

Musculoskeletal: No acute or significant osseous findings.
IMPRESSION: 1. No constipation. There is predominantly liquid stool in the colon
leading to air-fluid levels, mildly distending the lower sigmoid
colon and rectum.
2. No bowel inflammation.
3. Hepatic steatosis.  No other abnormalities.

## 2018-11-24 MED ORDER — SODIUM CHLORIDE 0.9 % IV SOLN
INTRAVENOUS | Status: DC
  Administered 2018-11-24: 16:00:00 via INTRAVENOUS

## 2018-11-24 NOTE — Progress Notes (Signed)
PROGRESS NOTE    Aaron Vega  ZOX:[id number] DOB: 07/30/1995 DOA: 11/20/2018 [facility]   Brief Narrative: Aaron Vega is a 23 y.o. male with a history of autism, seizure disorder, obesity. Patient presented secondary to constipation and decreased mobility. Concern for severe constipation/impaction. GI consulted for management.   Assessment & Plan:   Active Problems:   Constipation   Abnormal LFTs   Hyperglycemia   Obesity, Class III, BMI 40-49.9 (morbid obesity) (HCC)   Constipation/impaction Clinically significant. No BM in several weeks per mother. Imaging consistent with mild stool burden from recent CT performed at OSH in addition to abdominal x-ray performed on admission. GI consulted and is planning on flexible sigmoidoscopy for management. Patient not adherent with medication management secondary to behavioral diagnoses. Flexible sigmoidoscopy performed on 10/28 and significant for stool in the entire examined colon; lavage performed. NG placed with bowel prep administered but now NG tube pulled out. No recurrent bowel movements. Now with nausea/vomiting. Lipase normal. -GI recommendations: bowel regimen, colonoscopy at some point, will require cecostomy tube if recurrent issues arise -CT abdomen/pelvis  Weakness Patient had a witness/controlled fall overnight. No neurological deficits noted on exam. Weakness has been progressing over the past 4+ weeks per mother. -PT recommending HHPT, wheelchair, 3 in 1, wide RW; family declined SNF  Elevated TSH On chart review, he has had a recently normal free T4 with elevated TSH. It is possible he has subclinical hypothyroidism. Normal free T3/T4. -Continue Synthroid 25 mcg daily (new prescription) and have patient follow-up with PCP  Seizure disorder Patient with a seizure on 10/27. Patient has not received his AED medication secondary to recent vomiting. Seizure resolved with Versed. Currently  baseline -Continue clobazam, Vimpat -Continue diazepam and midazolam prn  Dehydration In setting of vomiting and decreased PO intake -NS IV fluids  Elevated AST/ALT Likely secondary to AED medications. Recent CT scan without liver/gallbladder pathology. RUQ ultrasound shows hepatic steatosis with no ductal dilation or gallbladder pathology. Stable.  Obesity Body mass index is 48.44 kg/m.   Hyperglycemia Mild.  Essential hypertension Patient is on lisinopril as an outpatient. BP is currently elevated. -Continue lisinopril 25 mg daily  Autism/Behavioral -Continue Celexa -Continue diazepam prn  Rash Appears to be heat rash. Asymptomatic. Keep area cool and dry.   DVT prophylaxis: SCDs Code Status:   Code Status: Full Code Family Communication: Mother at bedside Disposition Plan: Discharge pending improvement of vomiting/bowel movement   Consultants:   [facility]  Procedures:   None  Antimicrobials:  None    Subjective: Vomiting overnight. No bowel movement.  Objective: Vitals:   11/23/18 2051 11/24/18 0146 11/24/18 0500 11/24/18 0641  BP: (!) 160/81  (!) 149/83   Pulse: (!) 115  (!) 119 90  Resp: 19 16 20    Temp: 99 F (37.2 C)  98 F (36.7 C)   TempSrc: Oral  Oral   SpO2: 98%  98%   Weight:      Height:        Intake/Output Summary (Last 24 hours) at 11/24/2018 1113 Last data filed at 11/24/2018 0600 Gross per 24 hour  Intake 105.02 ml  Output -  Net 105.02 ml   Filed Weights   11/21/18 1136  Weight: (!) 148.8 kg    Examination:  General exam: Appears calm and comfortable Respiratory system: Clear to auscultation. Respiratory effort normal. Cardiovascular system: S1 & S2 heard, RRR. No murmurs, rubs, gallops or clicks. Gastrointestinal system: Abdomen is large, soft and seems to be  nontender. Normal bowel sounds heard. Central nervous system: Alert. No focal neurological deficits. Extremities: No edema. No calf tenderness Skin: No  cyanosis. Papular rash on right back     Data Reviewed: I have personally reviewed following labs and imaging studies  CBC: Recent Labs  Lab 11/20/18 1436 11/23/18 0917  WBC 8.2 6.1  NEUTROABS 5.3  --   HGB 14.1 12.7*  HCT 43.1 39.7  MCV 85.0 85.9  PLT 389 025   Basic Metabolic Panel: Recent Labs  Lab 11/20/18 1436 11/21/18 1519 11/23/18 0917 11/24/18 0659  NA 137 135 137 137  K 3.9 3.2* 3.7 3.6  CL 99 100 100 99  CO2 25 24 24 23   GLUCOSE 109* 104* 96 96  BUN 9 9 7 10   CREATININE 0.73 0.66 0.54* 0.48*  CALCIUM 9.7 9.0 9.3 9.5  MG  --  1.8  --   --    GFR: Estimated Creatinine Clearance: 210.5 mL/min (A) (by C-G formula based on SCr of 0.48 mg/dL (L)). Liver Function Tests: Recent Labs  Lab 11/20/18 1436 11/21/18 1519 11/23/18 0917 11/24/18 0659  AST 75* 50* 53* 44*  ALT 112* 91* 93* 87*  ALKPHOS 78 64 67 68  BILITOT 1.4* 0.9 1.2 1.9*  PROT 7.8 6.7 6.9 7.0  ALBUMIN 4.6 3.9 3.9 4.1   Recent Labs  Lab 11/21/18 1519 11/24/18 0659  LIPASE 25 32   No results for input(s): AMMONIA in the last 168 hours. Coagulation Profile: No results for input(s): INR, PROTIME in the last 168 hours. Cardiac Enzymes: No results for input(s): CKTOTAL, CKMB, CKMBINDEX, TROPONINI in the last 168 hours. BNP (last 3 results) No results for input(s): PROBNP in the last 8760 hours. HbA1C: No results for input(s): HGBA1C in the last 72 hours. CBG: No results for input(s): GLUCAP in the last 168 hours. Lipid Profile: No results for input(s): CHOL, HDL, LDLCALC, TRIG, CHOLHDL, LDLDIRECT in the last 72 hours. Thyroid Function Tests: Recent Labs    11/21/18 1508 11/21/18 1519  TSH  --  10.332*  FREET4 0.99  --   T3FREE 3.3  --    Anemia Panel: No results for input(s): VITAMINB12, FOLATE, FERRITIN, TIBC, IRON, RETICCTPCT in the last 72 hours. Sepsis Labs: No results for input(s): PROCALCITON, LATICACIDVEN in the last 168 hours.  Recent Results (from the past 240  hour(s))  SARS CORONAVIRUS 2 (TAT 6-24 HRS) Nasopharyngeal Nasopharyngeal Swab     Status: None   Collection Time: 11/20/18  6:43 PM   Specimen: Nasopharyngeal Swab  Result Value Ref Range Status   SARS Coronavirus 2 NEGATIVE NEGATIVE Final    Comment: (NOTE) SARS-CoV-2 target nucleic acids are NOT DETECTED. The SARS-CoV-2 RNA is generally detectable in upper and lower respiratory specimens during the acute phase of infection. Negative results do not preclude SARS-CoV-2 infection, do not rule out co-infections with other pathogens, and should not be used as the sole basis for treatment or other patient management decisions. Negative results must be combined with clinical observations, patient history, and epidemiological information. The expected result is Negative. Fact Sheet for Patients: SugarRoll.be Fact Sheet for Healthcare Providers: https://www.woods-mathews.com/ This test is not yet approved or cleared by the [place] FDA and  has been authorized for detection and/or diagnosis of SARS-CoV-2 by FDA under an Emergency Use Authorization (EUA). This EUA will remain  in effect (meaning this test can be used) for the duration of the COVID-19 declaration under Section 56 4(b)(1) of the Act, 21 U.S.C. section 360bbb-3(b)(1),  unless the authorization is terminated or revoked sooner. Performed at [facility], [address], [place], [place] [address]          Radiology Studies: Dg Abd Portable 1v  Result Date: 11/23/2018 CLINICAL DATA:  Colonic impaction. EXAM: PORTABLE ABDOMEN - 1 VIEW COMPARISON:  November 21, 2018. FINDINGS: The bowel gas pattern is normal. No radio-opaque calculi or other significant radiographic abnormality are seen. IMPRESSION: No evidence of bowel obstruction or ileus. No significant stool burden is noted. Electronically Signed   By: Lupita Raider M.D.   On: 11/23/2018 15:04   US Abdomen Limited Ruq   Result Date: 11/23/2018 CLINICAL DATA:  Right upper quadrant pain 4 weeks. EXAM: ULTRASOUND ABDOMEN LIMITED RIGHT UPPER QUADRANT COMPARISON:  None. FINDINGS: Gallbladder: No gallstones or wall thickening visualized. No sonographic Murphy sign noted by sonographer. Common bile duct: Diameter: 3.8 mm. Liver: Increased parenchymal echogenicity compatible with steatosis without focal mass. Portal vein is patent on color Doppler imaging with normal direction of blood flow towards the liver. Note that the entire liver is not adequately visualized due to patient body habitus. Other: None. IMPRESSION: No acute findings. Suggestion of hepatic steatosis without focal mass. Electronically Signed   By: Elberta Fortis M.D.   On: 11/23/2018 11:15        Scheduled Meds: . bisacodyl  10 mg Oral BID  . citalopram  40 mg Oral Daily  . cloBAZam  60 mg Oral BID  . diazepam  7.5 mg Oral Daily  . levothyroxine  25 mcg Oral Q0600  . linaclotide  290 mcg Oral QAC breakfast  . lisinopril  25 mg Oral Daily  . polyethylene glycol  17 g Oral BID   Continuous Infusions: . sodium chloride    . lacosamide (VIMPAT) IV 100 mg (11/24/18 0930)     LOS: 3 days     Jacquelin Hawking, MD [facility] 11/24/2018, 11:13 AM  If 7PM-7AM, please contact night-coverage [email or web address]

## 2018-11-25 DIAGNOSIS — K920 Hematemesis: Secondary | ICD-10-CM

## 2018-11-25 LAB — CBC
HCT: 42.8 % (ref 39.0–52.0)
Hemoglobin: 14.2 g/dL (ref 13.0–17.0)
MCH: 27.9 pg (ref 26.0–34.0)
MCHC: 33.2 g/dL (ref 30.0–36.0)
MCV: 84.1 fL (ref 80.0–100.0)
Platelets: 363 10*3/uL (ref 150–400)
RBC: 5.09 MIL/uL (ref 4.22–5.81)
RDW: 12.5 % (ref 11.5–15.5)
WBC: 12.7 10*3/uL — ABNORMAL HIGH (ref 4.0–10.5)
nRBC: 0 % (ref 0.0–0.2)

## 2018-11-25 MED ORDER — LEVOTHYROXINE SODIUM 25 MCG PO TABS: 25.0000 ug | ORAL_TABLET | Freq: Every day | ORAL | 0 refills | Status: AC

## 2018-11-25 MED ORDER — LINACLOTIDE 290 MCG PO CAPS: 290.0000 ug | ORAL_CAPSULE | Freq: Every day | ORAL | 0 refills | Status: AC

## 2018-11-25 MED ORDER — ONDANSETRON 4 MG PO TBDP: 4.0000 mg | ORAL_TABLET | Freq: Three times a day (TID) | ORAL | 0 refills | Status: AC | PRN

## 2018-11-25 MED ORDER — SUCRALFATE 1 GM/10ML PO SUSP
1.0000 g | Freq: Three times a day (TID) | ORAL | Status: DC
  Administered 2018-11-25 – 2018-11-26 (×3): 1 g via ORAL
  Filled 2018-11-25 (×4): qty 10

## 2018-11-25 MED ORDER — POLYETHYLENE GLYCOL 3350 17 G PO PACK: 17.0000 g | PACK | Freq: Two times a day (BID) | ORAL | 0 refills | Status: AC

## 2018-11-25 MED ORDER — PANTOPRAZOLE SODIUM 40 MG IV SOLR
40.0000 mg | Freq: Two times a day (BID) | INTRAVENOUS | Status: DC
  Administered 2018-11-25 – 2018-11-27 (×4): 40 mg via INTRAVENOUS
  Filled 2018-11-25 (×4): qty 40

## 2018-11-25 MED ORDER — PANTOPRAZOLE SODIUM 40 MG PO PACK
40.0000 mg | PACK | Freq: Every day | ORAL | Status: DC
  Administered 2018-11-25: 40 mg
  Filled 2018-11-25: qty 20

## 2018-11-25 MED ORDER — DEXTROSE-NACL 5-0.45 % IV SOLN
INTRAVENOUS | Status: DC
  Administered 2018-11-25 – 2018-11-27 (×3): via INTRAVENOUS

## 2018-11-25 NOTE — Discharge Instructions (Signed)
Aaron Vega,  You are in the hospital because of severe constipation in addition to nausea and vomiting.  You had a sigmoidoscopy with attempts to remove most of the stool in your system.  He also had a bowel movement on her own with stool softener support.  Recommendation to continue stool softener in addition to Linzess if able.  Please follow-up with your gastroenterologist as an outpatient.  Please use Zofran as needed for nausea and vomiting.  While here, he also dealt with some tachycardia.  You are also found to have evidence of possible subclinical hypothyroidism.  You are started on Synthroid and will need to follow-up with your primary care physician.  With regard to your tachycardia (fast heart rate), please follow-up with your primary care physician to have this followed.  With regard to your seizures, there is concern about Vimpat possibly contributing to the constipation.  This would be a good question for your neurologist.  Please inquire if would be beneficial to have your antiepileptic medication adjusted.

## 2018-11-25 NOTE — TOC Progression Note (Signed)
Transition of Care [facility]) - Progression Note    Patient Details  Name: Aaron Vega MRN: [id number] Date of Birth: [birth year]  [facility] CM/SW Contact  Joaquin Courts, RN Phone Number: 11/25/2018, 3:44 PM  Clinical Narrative:    CM reached out to [employer] for DME needs including wheelchair, rolling walker, 3-in-1, and hoyer lift.    Expected Discharge Plan: [place] Barriers to Discharge: Continued Medical Work up  Expected Discharge Plan and Services Expected Discharge Plan: [place]   Discharge Planning Services: CM Consult Post Acute Care Choice: [place] arrangements for the past 2 months: Single Family Home Expected Discharge Date: 11/25/18               DME Arranged: Wheelchair manual, Walker rolling, 3-N-1(hoyer lift) DME Agency: [employer] Date DME Agency Contacted: 11/25/18 Time DME Agency Contacted: [phone] Representative spoke with at DME Agency: [place]             Social Determinants of Health ([place]) Interventions    Readmission Risk Interventions No flowsheet data found.

## 2018-11-25 NOTE — Progress Notes (Signed)
Patient has had a total of 3 episodes of bloody emesis.  1st episode was bright red, 2nd and 3rd were maroon colored.

## 2018-11-25 NOTE — Discharge Summary (Addendum)
Physician Discharge Summary  Aaron Vega [name] DOB: November 03, 1995 DOA: 11/20/2018  [name]  Admit date: 11/20/2018 Discharge date: 11/27/2018  Admitted From: Home Disposition: Home  Recommendations for Outpatient Follow-up:  1. Follow up with PCP in 1 week 2. Follow up with GI in 2 weeks 3. Follow up tachycardia 4. Please obtain BMP/CBC in one week 5. Thyroid studies in 4-6 weeks 6. Please follow up on the following pending results: None  Home Health: PT Equipment/Devices: 3 in 1, RW, Wheelchair, [employer] lift  Discharge Condition: Stable CODE STATUS: Full code Diet recommendation: Regular diet   Brief/Interim Summary:  Admission HPI written by Alwyn Ren, MD   Chief Complaint: Constipation for 4 weeks or more  HPI: Aaron Vega is a 23 y.o. male with medical history significant of severe autism nonverbal with autism admitted with complaints of no bowel movements for the last 4 weeks.  Patient has been vomiting some of which have been induced by him by putting his finger in his mouth and vomiting.  He has been just drinking liquids 7-Up's ginger ale etc. but not been eating anything at home has lost weight mother does not know how much weight he has lost.  History is obtained from the patient's mother since patient is nonverbal.  Patient was admitted to [facility] 11/15/2018 CT scan showed constipation. Mother has tried Dulcolax, MiraLAX at home without any effect.  She denies any sick contacts.  She denies cough.    Hospital course:  Constipation/impaction Clinically significant. No BM in several weeks per mother. Imaging consistent with mild stool burden from recent CT performed at OSH in addition to abdominal x-ray performed on admission. GI consulted and is planning on flexible sigmoidoscopy for management. Patient not adherent with medication management secondary to behavioral diagnoses. Flexible sigmoidoscopy  performed on 10/28 and significant for stool in the entire examined colon; lavage performed. NG placed with bowel prep administered but NG tube pulled out. Lipase normal. CT abdomen/pelvis significant for some stool. Patient discharged with Miralax, Linzess (may not be adherent). Bowel movement on his own prior to discharge. Will need follow-up with outpatient gastroenterologist.  Weakness Patient had a witness/controlled fall overnight. No neurological deficits noted on exam. Weakness has been progressing over the past 4+ weeks per mother. PT recommending HHPT, wheelchair, 3 in 1, wide RW, [employer] lift; family declined SNF.  Hematemesis Emesis Patient with a history of esophagitis. 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 Pylori, dysplasia or malignancy. GI re-consulted and recommended BID PPI in addition to Carafate. Patient still with intermittent vomiting and has poor oral intake, however, mother has requested discharge. Concerns with regard to poor nutrition intake discussed with mother prior to discharge.  Elevated TSH On chart review, he has had a recently normal free T4 with elevated TSH. It is possible he has subclinical hypothyroidism. Normal free T3/T4. Discharge on Synthroid 25 mcg daily and repeat thyroid studies as an outpatient.  Seizure disorder Patient with a seizure on 10/27. Patient has not received his AED medication secondary to recent vomiting. Seizure resolved with Versed. Currently baseline. Continue clobazam, Vimpat. Continue diazepam and midazolam prn. Consider looking into if Vimpat could be contributing to patient's constipation.  Dehydration In setting of vomiting and decreased PO intake. Improved with IV fluids. No recurrent emesis overnight. Patient still with poor oral intake however, mother requesting discharge stating he would do better at home  with oral intake.  Tachycardia is stable and blood pressure is normal/elevated. No obvious signs of dehydration on discharge.  Tachycardia Appears to be chronic. Has not responded to IV fluids and was present on chart review from previous hospital/provider encounters. Sinus tachycardia. May need outpatient follow-up.  Elevated AST/ALT Likely secondary to AED medications. Recent CT scan without liver/gallbladder pathology. RUQ ultrasound shows hepatic steatosis with no ductal dilation or gallbladder pathology. Stable. PCP follow-up. Weight loss needed if able.  Obesity Body mass index is 48.44 kg/m.   Hyperglycemia Mild.  Essential hypertension Continue lisinopril 25 mg daily  Autism/Behavioral Continue Celexa, diazepam prn  Rash Appears to be heat rash. Asymptomatic. Keep area cool and dry.  Discharge Diagnoses:  Active Problems:   Constipation   Abnormal LFTs   Hyperglycemia   Obesity, Class III, BMI 40-49.9 (morbid obesity) ([place])   Hematemesis    Discharge Instructions   Allergies as of 11/27/2018   No Known Allergies     Medication List    STOP taking these medications   omeprazole 2 mg/mL Susp oral suspension Commonly known as: FIRST-Omeprazole Replaced by: omeprazole 40 MG capsule   ondansetron 4 MG tablet Commonly known as: Zofran     TAKE these medications   acetaminophen 650 MG suppository Commonly known as: TYLENOL Place 650 mg rectally every 6 (six) hours as needed.   citalopram 40 MG tablet Commonly known as: CELEXA Take 40 mg by mouth daily.   cloBAZam 20 MG tablet Commonly known as: ONFI Take 60 mg by mouth 2 (two) times daily.   diazepam 20 MG Gel Commonly known as: DIASAT Place 20 mg rectally daily as needed (for seizures lasting more than 5 minutes.).   diazepam 5 MG tablet Commonly known as: VALIUM Take 7.5 mg by mouth every morning. May give 0.5 tablet (2.5mg ) as needed for agitation/anxiety   levothyroxine 25 MCG tablet Commonly known  as: SYNTHROID Take 1 tablet (25 mcg total) by mouth daily before breakfast.   linaclotide 290 MCG Caps capsule Commonly known as: LINZESS Take 1 capsule (290 mcg total) by mouth daily before breakfast.   lisinopril 20 MG tablet Commonly known as: ZESTRIL Take 20 mg by mouth daily. Take with 5mg  tablet for a total dose of 25mg    lisinopril 5 MG tablet Commonly known as: ZESTRIL Take 5 mg by mouth daily. Take with 20mg  tablet for a total dose of 25mg    Melatonin 5 MG Tabs Take 5 mg by mouth at bedtime.   Nayzilam 5 MG/0.1ML Soln Generic drug: Midazolam Place 5 mg into the nose daily as needed (seizure).   nystatin powder Generic drug: nystatin Apply 1 application topically daily. Neck, crevices, etc.   omeprazole 40 MG capsule Commonly known as: PRILOSEC Take 1 capsule (40 mg total) by mouth 2 (two) times daily. Open capsule and sprinkle into applesauce or yoghurt. Replaces: omeprazole 2 mg/mL Susp oral suspension   ondansetron 4 MG disintegrating tablet Commonly known as: Zofran ODT Take 1 tablet (4 mg total) by mouth every 8 (eight) hours as needed for nausea or vomiting.   polyethylene glycol 17 g packet Commonly known as: MiraLax Take 17 g by mouth 2 (two) times daily. What changed: when to take this   promethazine 25 MG suppository Commonly known as: PHENERGAN Place 1 suppository (25 mg total) rectally every 6 (six) hours as needed for nausea or vomiting.   sucralfate 1 GM/10ML suspension Commonly known as: CARAFATE Take 10 mLs (1 g total) by mouth 4 (  four) times daily -  with meals and at bedtime.   Vimpat 10 MG/ML oral solution Generic drug: lacosamide Take 10 mLs by mouth 2 (two) times daily.     ASK your doctor about these medications   docusate sodium 100 MG capsule Commonly known as: COLACE Take 100 mg by mouth 2 (two) times daily. Ask about: Should I take this medication?            Durable Medical Equipment  (From admission, onward)           Start     Ordered   11/27/18 1226  For home use only DME standard manual wheelchair with seat cushion  Once    Comments: Patient suffers from Autism which impairs their ability to perform daily activities like bathing, dressing, feeding, grooming and toileting in the home.  A cane, crutch or walker will not resolve issue with performing activities of daily living. A wheelchair will allow patient to safely perform daily activities. Patient can safely propel the wheelchair in the home or has a caregiver who can provide assistance. Length of need 6 months . Accessories: elevating leg rests (ELRs), wheel locks, extensions and anti-tippers.  Extra heavy duty and back cushion   11/27/18 1225   11/27/18 1225  For home use only DME 3 n 1  Once    Comments: Heavy duty   11/27/18 1225   11/25/18 1508  For home use only DME Other see comment  Once    Comments: Hoyer lift  Patient needs lift secondary to severe LE weakness/dibilitation and 3+ for lifting. 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  Question:  Length of Need  Answer:  6 Months   11/25/18 1509   11/25/18 1426  For home use only DME Walker rolling  Once    Comments: Wide  Question Answer Comment  Patient needs a walker to treat with the following condition Autism   Patient needs a walker to treat with the following condition Constipation   Patient needs a walker to treat with the following condition Nausea & vomiting      11/25/18 1426         Follow-up Information    [facility]. Schedule an appointment as soon as possible for a visit in 1 week(s).   Specialty: Pediatrics Contact information: [address] [place] [address] [phone]        Lynann Bologna, MD. Go in 2 week(s).   Specialties: Gastroenterology, Internal Medicine Why: Severe constipation, nausea, vomiting Contact information: [address] [place] [address] [phone]          No Known Allergies  Consultations:  Gastroenterology   Procedures/Studies: Ct Abdomen Pelvis Wo Contrast  Result Date: 11/24/2018 CLINICAL DATA:  Constipation. Possible fecal impaction. Two episodes of diarrhea today. EXAM: CT ABDOMEN AND PELVIS WITHOUT CONTRAST TECHNIQUE: Multidetector CT imaging of the abdomen and pelvis was performed following the standard protocol without IV contrast. COMPARISON:  11/03/2018 FINDINGS: Lower chest: Clear lung bases.  Heart normal in size. Hepatobiliary: Decreased attenuation of the liver consistent with fatty infiltration. No liver mass or focal lesion. Normal gallbladder. No bile duct dilation. Pancreas: Unremarkable. No pancreatic ductal dilatation or surrounding inflammatory changes. Spleen: Normal in size without focal abnormality. Adrenals/Urinary Tract: Adrenal glands are unremarkable. Kidneys are normal, without renal calculi, focal lesion, or hydronephrosis. Bladder is unremarkable. Stomach/Bowel: Rectum and lower sigmoid colon are mildly distended with stool. Stool is mostly  liquid the 2 air-fluid level in the sigmoid colon. There are additional air-fluid levels throughout the colon. No colonic wall thickening. No inflammation. Normal stomach. Small bowel is normal in caliber. No wall thickening or inflammation. Normal appendix visualized. Vascular/Lymphatic: No significant vascular findings are present. No enlarged abdominal or pelvic lymph nodes. Reproductive: Unremarkable. Other: No abdominal wall hernia or abnormality. No abdominopelvic ascites. Musculoskeletal: No acute or significant osseous findings. IMPRESSION: 1. No constipation. There is predominantly liquid stool in the colon leading to air-fluid levels, mildly distending the lower sigmoid colon and rectum. 2. No bowel inflammation. 3. Hepatic steatosis.  No other abnormalities. Electronically Signed   By: Amie Portlandavid  Ormond M.D.    On: 11/24/2018 17:36   Dg Abd 1 View  Result Date: 11/21/2018 CLINICAL DATA:  NG tube placement. EXAM: ABDOMEN - 1 VIEW COMPARISON:  Radiographs dated 11/20/2018 FINDINGS: NG tube is been inserted and the tip is looped in the fundus of the stomach. No dilated loops of large or small bowel. No visible free air or free fluid. No significant bone abnormality. IMPRESSION: Benign appearing abdomen. The tip of the tube is in the fundus of the stomach. Electronically Signed   By: Francene BoyersJames  Maxwell M.D.   On: 11/21/2018 14:25   Dg Chest Port 1 View  Result Date: 11/21/2018 CLINICAL DATA:  NG tube placement. EXAM: PORTABLE CHEST 1 VIEW COMPARISON:  Chest x-ray dated 11/03/2018 FINDINGS: NG tube is looped in the fundus of the stomach. Heart size and pulmonary vascularity are normal. Lungs are clear. No acute bone abnormality. IMPRESSION: No acute abnormalities. NG tube is looped in the fundus of the stomach. Electronically Signed   By: Francene BoyersJames  Maxwell M.D.   On: 11/21/2018 14:24   Dg Abdomen Acute W/chest  Result Date: 11/20/2018 CLINICAL DATA:  Constipation, pain, vomiting EXAM: DG ABDOMEN ACUTE W/ 1V CHEST COMPARISON:  October 05, 2018 FINDINGS: Suboptimal evaluation due to difficulty with patient positioning. Low lung volumes secondary to poor inspiration. There is no free air. There are no dilated loops of bowel or air-fluid levels to suggest bowel obstruction. Overall stool burden appears mild, greater on the right. Increased density of colon contents could reflect some retained contrast material. IMPRESSION: No evidence of bowel obstruction. Overall mild stool burden, greater on the right. Electronically Signed   By: Guadlupe SpanishPraneil  Patel M.D.   On: 11/20/2018 16:09   Dg Abd Portable 1v  Result Date: 11/23/2018 CLINICAL DATA:  Colonic impaction. EXAM: PORTABLE ABDOMEN - 1 VIEW COMPARISON:  November 21, 2018. FINDINGS: The bowel gas pattern is normal. No radio-opaque calculi or other significant radiographic  abnormality are seen. IMPRESSION: No evidence of bowel obstruction or ileus. No significant stool burden is noted. Electronically Signed   By: Lupita RaiderJames  Green Jr M.D.   On: 11/23/2018 15:04   [place] Abdomen Limited Ruq  Result Date: 11/23/2018 CLINICAL DATA:  Right upper quadrant pain 4 weeks. EXAM: ULTRASOUND ABDOMEN LIMITED RIGHT UPPER QUADRANT COMPARISON:  None. FINDINGS: Gallbladder: No gallstones or wall thickening visualized. No sonographic Murphy sign noted by sonographer. Common bile duct: Diameter: 3.8 mm. Liver: Increased parenchymal echogenicity compatible with steatosis without focal mass. Portal vein is patent on color Doppler imaging with normal direction of blood flow towards the liver. Note that the entire liver is not adequately visualized due to patient body habitus. Other: None. IMPRESSION: No acute findings. Suggestion of hepatic steatosis without focal mass. Electronically Signed   By: Elberta Fortisaniel  Boyle M.D.   On: 11/23/2018 11:15  Subjective: Bowel movement. Some vomiting last night but tolerated medications with Zofran.  Discharge Exam: Vitals:   11/27/18 0550 11/27/18 0656  BP: (!) 163/97   Pulse: (!) 113 100  Resp: 18   Temp: 98.7 F (37.1 C)   SpO2: 99%    Vitals:   11/26/18 1415 11/26/18 2110 11/27/18 0550 11/27/18 0656  BP: (!) 147/97 (!) 145/88 (!) 163/97   Pulse: (!) 125 (!) 112 (!) 113 100  Resp: Temp: 99.3 F (37.4 C) 99.3 F (37.4 C) 98.7 F (37.1 C)   TempSrc: Oral Oral Oral   SpO2: 97% 98% 99%   Weight:      Height:        General: Pt is alert, awake, not in acute distress Cardiovascular: RRR, S1/S2 +, no rubs, no gallops Respiratory: CTA bilaterally, no wheezing, no rhonchi Abdominal: Soft, NT, ND, bowel sounds + Extremities: no edema, no cyanosis    The results of significant diagnostics from this hospitalization (including imaging, microbiology, ancillary and laboratory) are listed below for reference.     Microbiology: Recent  Results (from the past 240 hour(s))  SARS CORONAVIRUS 2 (TAT 6-24 HRS) Nasopharyngeal Nasopharyngeal Swab     Status: None   Collection Time: 11/20/18  6:43 PM   Specimen: Nasopharyngeal Swab  Result Value Ref Range Status   SARS Coronavirus 2 NEGATIVE NEGATIVE Final    Comment: (NOTE) SARS-CoV-2 target nucleic acids are NOT DETECTED. The SARS-CoV-2 RNA is generally detectable in upper and lower respiratory specimens during the acute phase of infection. Negative results do not preclude SARS-CoV-2 infection, do not rule out co-infections with other pathogens, and should not be used as the sole basis for treatment or other patient management decisions. Negative results must be combined with clinical observations, patient history, and epidemiological information. The expected result is Negative. Fact Sheet for Patients: HairSlick.no Fact Sheet for Healthcare Providers: quierodirigir.com This test is not yet approved or cleared by the [place] FDA and  has been authorized for detection and/or diagnosis of SARS-CoV-2 by FDA under an Emergency Use Authorization (EUA). This EUA will remain  in effect (meaning this test can be used) for the duration of the COVID-19 declaration under Section 56 4(b)(1) of the Act, 21 U.S.C. section 360bbb-3(b)(1), unless the authorization is terminated or revoked sooner. Performed at [facility], [address] [place], [place] [address]      Labs: BNP (last 3 results) No results for input(s): BNP in the last 8760 hours. Basic Metabolic Panel: Recent Labs  Lab 11/21/18 1519 11/23/18 0917 11/24/18 0659 11/26/18 1543 11/27/18 0524  NA 135 137 137 138 137  K 3.2* 3.7 3.6 3.1* 3.2*  CL 100 100 99 103 101  CO2 GLUCOSE 104* 96 96 117* 125*  BUN CREATININE 0.66 0.54* 0.48* 0.59* 0.49*  CALCIUM 9.0 9.3 9.5 9.3 9.1  MG 1.8  --   --   --   --    Liver  Function Tests: Recent Labs  Lab 11/20/18 1436 11/21/18 1519 11/23/18 0917 11/24/18 0659  AST 75* 50* 53* 44*  ALT 112* 91* 93* 87*  ALKPHOS 78 64 67 68  BILITOT 1.4* 0.9 1.2 1.9*  PROT 7.8 6.7 6.9 7.0  ALBUMIN 4.6 3.9 3.9 4.1   Recent Labs  Lab 11/21/18 1519 11/24/18 0659  LIPASE 25 32   No results for input(s): AMMONIA in the last  168 hours. CBC: Recent Labs  Lab 11/20/18 1436 11/23/18 0917 11/25/18 1526 11/26/18 0536 11/27/18 0524  WBC 8.2 6.1 12.7* 10.0 7.3  NEUTROABS 5.3  --   --   --   --   HGB 14.1 12.7* 14.2 12.8* 12.6*  HCT 43.1 39.7 42.8 39.8 39.4  MCV 85.0 85.9 84.1 86.3 86.6  PLT 389 336 363 386 331   Cardiac Enzymes: No results for input(s): CKTOTAL, CKMB, CKMBINDEX, TROPONINI in the last 168 hours. BNP: Invalid input(s): POCBNP CBG: No results for input(s): GLUCAP in the last 168 hours. D-Dimer No results for input(s): DDIMER in the last 72 hours. Hgb A1c No results for input(s): HGBA1C in the last 72 hours. Lipid Profile No results for input(s): CHOL, HDL, LDLCALC, TRIG, CHOLHDL, LDLDIRECT in the last 72 hours. Thyroid function studies No results for input(s): TSH, T4TOTAL, T3FREE, THYROIDAB in the last 72 hours.  Invalid input(s): FREET3 Anemia work up No results for input(s): VITAMINB12, FOLATE, FERRITIN, TIBC, IRON, RETICCTPCT in the last 72 hours. Urinalysis    Component Value Date/Time   COLORURINE AMBER (A) 11/07/2018 1530   APPEARANCEUR CLOUDY (A) 11/07/2018 1530   LABSPEC 1.027 11/07/2018 1530   PHURINE 5.0 11/07/2018 1530   GLUCOSEU NEGATIVE 11/07/2018 1530   HGBUR NEGATIVE 11/07/2018 1530   BILIRUBINUR SMALL (A) 11/07/2018 1530   KETONESUR NEGATIVE 11/07/2018 1530   PROTEINUR 30 (A) 11/07/2018 1530   NITRITE NEGATIVE 11/07/2018 1530   LEUKOCYTESUR NEGATIVE 11/07/2018 1530   Sepsis Labs Invalid input(s): PROCALCITONIN,  WBC,  LACTICIDVEN Microbiology Recent Results (from the past 240 hour(s))  SARS CORONAVIRUS 2 (TAT  6-24 HRS) Nasopharyngeal Nasopharyngeal Swab     Status: None   Collection Time: 11/20/18  6:43 PM   Specimen: Nasopharyngeal Swab  Result Value Ref Range Status   SARS Coronavirus 2 NEGATIVE NEGATIVE Final    Comment: (NOTE) SARS-CoV-2 target nucleic acids are NOT DETECTED. The SARS-CoV-2 RNA is generally detectable in upper and lower respiratory specimens during the acute phase of infection. Negative results do not preclude SARS-CoV-2 infection, do not rule out co-infections with other pathogens, and should not be used as the sole basis for treatment or other patient management decisions. Negative results must be combined with clinical observations, patient history, and epidemiological information. The expected result is Negative. Fact Sheet for Patients: HairSlick.no Fact Sheet for Healthcare Providers: quierodirigir.com This test is not yet approved or cleared by the [place] FDA and  has been authorized for detection and/or diagnosis of SARS-CoV-2 by FDA under an Emergency Use Authorization (EUA). This EUA will remain  in effect (meaning this test can be used) for the duration of the COVID-19 declaration under Section 56 4(b)(1) of the Act, 21 U.S.C. section 360bbb-3(b)(1), unless the authorization is terminated or revoked sooner. Performed at [facility], [address], [place], [place] [address]      Time coordinating discharge: 35 minutes  SIGNED:   Jacquelin Hawking, MD [facility] 11/27/2018, 12:29 PM

## 2018-11-25 NOTE — Progress Notes (Addendum)
Patient according to mother did not want to get back into bed, because he wanted to leave and sat down on the ground, laid down and rolled over.  Patient did not fall, but a lift was needed to get patient into wheelchair.  Due to patients weakness mother was encouraged that patient might not be fit to go home and may be better off going to rehab facility.  Mother and grandmother both insistent that patient go home despite this discussion.  Attempted to get patient home but patient unable to support weight and sat down immediately halfway in wheelchair and legs dangling out.  Attempted to slide patient back into the wheelchair, but he was too big.  Wheelchair had to be moved out from under the patient in order to get lift pad underneath patient.  Patient had to be assisted up with a lift and had soiled on the ground.  Patient started vomiting blood after discharge instructions were given to mother.  Dr. Lonny Prude notified and came to see and discuss with family.  Patient to be seen by gastroenterologist tomorrow to evaluate.

## 2018-11-25 NOTE — Progress Notes (Addendum)
PROGRESS NOTE    Aaron Vega  IDP:[id number] DOB: 11/24/1995 DOA: 11/20/2018 PCP: [facility]   Brief Narrative: Aaron Vega is a 23 y.o. male with a history of autism, seizure disorder, obesity. Patient presented secondary to constipation and decreased mobility. Concern for severe constipation/impaction. GI consulted for management. Patient underwent flexible sigmoidoscopy with lavage. Having bowel movements now but prior to discharge, had an episode of hematemesis.   Assessment & Plan:   Active Problems:   Constipation   Abnormal LFTs   Hyperglycemia   Obesity, Class III, BMI 40-49.9 (morbid obesity) (HCC)   Constipation/impaction Clinically significant. No BM in several weeks per mother. Imaging consistent with mild stool burden from recent CT performed at OSH in addition to abdominal x-ray performed on admission. GI consulted and is planning on flexible sigmoidoscopy for management. Patient not adherent with medication management secondary to behavioral diagnoses. Flexible sigmoidoscopy performed on 10/28 and significant for stool in the entire examined colon; lavage performed. NG placed with bowel prep administered but now NG tube pulled out. Now with nausea/vomiting. Lipase normal. He has had a bowel movement on his own. -Continue Linzess, Miralax  Hematemesis Patient with a history of esophagitis. 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 Pylori, dysplasia or malignancy. -Stat CBC -GI consult: IV protonix, carafate  Weakness Patient had a witness/controlled fall overnight. No neurological deficits noted on exam. Weakness has been progressing over the past 4+ weeks per mother. -PT recommending HHPT, wheelchair, 3 in 1, wide RW; family declined SNF  Elevated TSH On chart review, he has had a recently normal free T4 with elevated TSH. It is  possible he has subclinical hypothyroidism. Normal free T3/T4. -Continue Synthroid 25 mcg daily (new prescription) and have patient follow-up with PCP  Seizure disorder Patient with a seizure on 10/27. Patient has not received his AED medication secondary to recent vomiting. Seizure resolved with Versed. Currently baseline -Continue clobazam, Vimpat -Continue diazepam and midazolam prn  Dehydration In setting of vomiting and decreased PO intake  Elevated AST/ALT Likely secondary to AED medications. Recent CT scan without liver/gallbladder pathology. RUQ ultrasound shows hepatic steatosis with no ductal dilation or gallbladder pathology. Stable.  Obesity Body mass index is 48.44 kg/m.   Hyperglycemia Mild.  Essential hypertension Patient is on lisinopril as an outpatient. BP is currently elevated. -Continue lisinopril 25 mg daily  Autism/Behavioral -Continue Celexa -Continue diazepam prn  Rash Appears to be heat rash. Asymptomatic. Keep area cool and dry.   DVT prophylaxis: SCDs Code Status:   Code Status: Full Code Family Communication: Mother at bedside Disposition Plan: Discharge pending improvement of vomiting/bowel movement   Consultants:   [facility]  Procedures:   None  Antimicrobials:  None    Subjective: Episode of hematemesis prior to discharge this afternoon. Bowel movement earlier. Still with some nausea.  Objective: Vitals:   11/24/18 1100 11/24/18 1621 11/24/18 2053 11/25/18 1300  BP: (!) 157/112 130/81 (!) 151/80   Pulse: (!) 120 (!) 119 (!) 119 (!) 127  Resp:  18 19   Temp:  99.3 F (37.4 C) 98.9 F (37.2 C)   TempSrc:  Oral Oral   SpO2: 97% 94% 94% 98%  Weight:      Height:        Intake/Output Summary (Last 24 hours) at 11/25/2018 1504 Last data filed at 11/25/2018 0300 Gross per 24 hour  Intake 1108.72 ml  Output -  Net  1108.72 ml   Filed Weights   11/21/18 1136  Weight: (!) 148.8 kg    Examination:  General exam:  Appears calm and comfortable Respiratory system: Clear to auscultation. Respiratory effort normal. Cardiovascular system: S1 & S2 heard, RRR. No murmurs, rubs, gallops or clicks. Gastrointestinal system: Abdomen is nondistended, soft and nontender. No organomegaly or masses felt. Normal bowel sounds heard. Central nervous system: Alert. No focal neurological deficits. Extremities: No edema. No calf tenderness Skin: No cyanosis. No rashes Psychiatry: Judgement and insight appear impaired. Blunt affect    Data Reviewed: I have personally reviewed following labs and imaging studies  CBC: Recent Labs  Lab 11/20/18 1436 11/23/18 0917  WBC 8.2 6.1  NEUTROABS 5.3  --   HGB 14.1 12.7*  HCT 43.1 39.7  MCV 85.0 85.9  PLT 389 336   Basic Metabolic Panel: Recent Labs  Lab 11/20/18 1436 11/21/18 1519 11/23/18 0917 11/24/18 0659  NA 137 135 137 137  K 3.9 3.2* 3.7 3.6  CL 99 100 100 99  CO2 25 24 24 23   GLUCOSE 109* 104* 96 96  BUN 9 9 7 10   CREATININE 0.73 0.66 0.54* 0.48*  CALCIUM 9.7 9.0 9.3 9.5  MG  --  1.8  --   --    GFR: Estimated Creatinine Clearance: 210.5 mL/min (A) (by C-G formula based on SCr of 0.48 mg/dL (L)). Liver Function Tests: Recent Labs  Lab 11/20/18 1436 11/21/18 1519 11/23/18 0917 11/24/18 0659  AST 75* 50* 53* 44*  ALT 112* 91* 93* 87*  ALKPHOS 78 64 67 68  BILITOT 1.4* 0.9 1.2 1.9*  PROT 7.8 6.7 6.9 7.0  ALBUMIN 4.6 3.9 3.9 4.1   Recent Labs  Lab 11/21/18 1519 11/24/18 0659  LIPASE 25 32   No results for input(s): AMMONIA in the last 168 hours. Coagulation Profile: No results for input(s): INR, PROTIME in the last 168 hours. Cardiac Enzymes: No results for input(s): CKTOTAL, CKMB, CKMBINDEX, TROPONINI in the last 168 hours. BNP (last 3 results) No results for input(s): PROBNP in the last 8760 hours. HbA1C: No results for input(s): HGBA1C in the last 72 hours. CBG: No results for input(s): GLUCAP in the last 168 hours. Lipid  Profile: No results for input(s): CHOL, HDL, LDLCALC, TRIG, CHOLHDL, LDLDIRECT in the last 72 hours. Thyroid Function Tests: No results for input(s): TSH, T4TOTAL, FREET4, T3FREE, THYROIDAB in the last 72 hours. Anemia Panel: No results for input(s): VITAMINB12, FOLATE, FERRITIN, TIBC, IRON, RETICCTPCT in the last 72 hours. Sepsis Labs: No results for input(s): PROCALCITON, LATICACIDVEN in the last 168 hours.  Recent Results (from the past 240 hour(s))  SARS CORONAVIRUS 2 (TAT 6-24 HRS) Nasopharyngeal Nasopharyngeal Swab     Status: None   Collection Time: 11/20/18  6:43 PM   Specimen: Nasopharyngeal Swab  Result Value Ref Range Status   SARS Coronavirus 2 NEGATIVE NEGATIVE Final    Comment: (NOTE) SARS-CoV-2 target nucleic acids are NOT DETECTED. The SARS-CoV-2 RNA is generally detectable in upper and lower respiratory specimens during the acute phase of infection. Negative results do not preclude SARS-CoV-2 infection, do not rule out co-infections with other pathogens, and should not be used as the sole basis for treatment or other patient management decisions. Negative results must be combined with clinical observations, patient history, and epidemiological information. The expected result is Negative. Fact Sheet for Patients: 11/26/18 Fact Sheet for Healthcare Providers: 11/22/18 This test is not yet approved or cleared by the [place] FDA and  has been authorized for detection and/or diagnosis of SARS-CoV-2 by FDA under an Emergency Use Authorization (EUA). This EUA will remain  in effect (meaning this test can be used) for the duration of the COVID-19 declaration under Section 56 4(b)(1) of the Act, 21 U.S.C. section 360bbb-3(b)(1), unless the authorization is terminated or revoked sooner. Performed at [facility], [address] [place], [place] [address]          Radiology Studies: Ct  Abdomen Pelvis Wo Contrast  Result Date: 11/24/2018 CLINICAL DATA:  Constipation. Possible fecal impaction. Two episodes of diarrhea today. EXAM: CT ABDOMEN AND PELVIS WITHOUT CONTRAST TECHNIQUE: Multidetector CT imaging of the abdomen and pelvis was performed following the standard protocol without IV contrast. COMPARISON:  11/03/2018 FINDINGS: Lower chest: Clear lung bases.  Heart normal in size. Hepatobiliary: Decreased attenuation of the liver consistent with fatty infiltration. No liver mass or focal lesion. Normal gallbladder. No bile duct dilation. Pancreas: Unremarkable. No pancreatic ductal dilatation or surrounding inflammatory changes. Spleen: Normal in size without focal abnormality. Adrenals/Urinary Tract: Adrenal glands are unremarkable. Kidneys are normal, without renal calculi, focal lesion, or hydronephrosis. Bladder is unremarkable. Stomach/Bowel: Rectum and lower sigmoid colon are mildly distended with stool. Stool is mostly liquid the 2 air-fluid level in the sigmoid colon. There are additional air-fluid levels throughout the colon. No colonic wall thickening. No inflammation. Normal stomach. Small bowel is normal in caliber. No wall thickening or inflammation. Normal appendix visualized. Vascular/Lymphatic: No significant vascular findings are present. No enlarged abdominal or pelvic lymph nodes. Reproductive: Unremarkable. Other: No abdominal wall hernia or abnormality. No abdominopelvic ascites. Musculoskeletal: No acute or significant osseous findings. IMPRESSION: 1. No constipation. There is predominantly liquid stool in the colon leading to air-fluid levels, mildly distending the lower sigmoid colon and rectum. 2. No bowel inflammation. 3. Hepatic steatosis.  No other abnormalities. Electronically Signed   By: Amie Portlandavid  Ormond M.D.   On: 11/24/2018 17:36        Scheduled Meds: . bisacodyl  10 mg Oral BID  . citalopram  40 mg Oral Daily  . cloBAZam  60 mg Oral BID  . diazepam  7.5  mg Oral Daily  . levothyroxine  25 mcg Oral Q0600  . linaclotide  290 mcg Oral QAC breakfast  . lisinopril  25 mg Oral Daily  . pantoprazole (PROTONIX) IV  40 mg Intravenous Q12H  . polyethylene glycol  17 g Oral BID  . sucralfate  1 g Oral TID WC & HS   Continuous Infusions: . sodium chloride 100 mL/hr at 11/25/18 0300  . lacosamide (VIMPAT) IV 100 mg (11/25/18 [id number])     LOS: 4 days     Jacquelin Hawkingalph Krisann Mckenna, MD [facility] 11/25/2018, 3:04 PM  If 7PM-7AM, please contact night-coverage [email or web address]

## 2018-11-26 LAB — BASIC METABOLIC PANEL
Anion gap: 11 (ref 5–15)
BUN: 9 mg/dL (ref 6–20)
CO2: 24 mmol/L (ref 22–32)
Calcium: 9.3 mg/dL (ref 8.9–10.3)
Chloride: 103 mmol/L (ref 98–111)
Creatinine, Ser: 0.59 mg/dL — ABNORMAL LOW (ref 0.61–1.24)
GFR calc Af Amer: >60 mL/min (ref >60)
GFR calc non Af Amer: >60 mL/min (ref >60)
Glucose, Bld: 117 mg/dL — ABNORMAL HIGH (ref 70–99)
Potassium: 3.1 mmol/L — ABNORMAL LOW (ref 3.5–5.1)
Sodium: 138 mmol/L (ref 135–145)

## 2018-11-26 LAB — CBC
HCT: 39.8 % (ref 39.0–52.0)
Hemoglobin: 12.8 g/dL — ABNORMAL LOW (ref 13.0–17.0)
MCH: 27.8 pg (ref 26.0–34.0)
MCHC: 32.2 g/dL (ref 30.0–36.0)
MCV: 86.3 fL (ref 80.0–100.0)
Platelets: 386 10*3/uL (ref 150–400)
RBC: 4.61 MIL/uL (ref 4.22–5.81)
RDW: 12.7 % (ref 11.5–15.5)
WBC: 10 10*3/uL (ref 4.0–10.5)
nRBC: 0 % (ref 0.0–0.2)

## 2018-11-26 MED ORDER — POLYETHYLENE GLYCOL 3350 17 G PO PACK: 17.0000 g | PACK | Freq: Every day | ORAL | Status: DC

## 2018-11-26 MED ORDER — ONDANSETRON HCL 4 MG/2ML IJ SOLN
4.0000 mg | Freq: Four times a day (QID) | INTRAMUSCULAR | Status: DC
  Administered 2018-11-26 – 2018-11-27 (×4): 4 mg via INTRAVENOUS
  Filled 2018-11-26 (×3): qty 2

## 2018-11-26 NOTE — TOC Progression Note (Signed)
Transition of Care [facility]) - Progression Note    Patient Details  Name: Aaron Vega MRN: [id number] Date of Birth: Jun 09, 1995  Transition of Care [facility]) CM/SW [place], [place] Phone Number: [phone] 11/26/2018, 4:33 PM  Clinical Narrative:   Unable as of yet to secure home health for pt with Medicaid insurance, will continue referring. DME has been ordered through [employer] (3n1, hoyer lift, manual wheelchair, rolling walker), potential lack of availability of wheelchair- will follow up.     Expected Discharge Plan: [place] Barriers to Discharge: Continued Medical Work up  Expected Discharge Plan and Services Expected Discharge Plan: [place]   Discharge Planning Services: CM Consult Post Acute Care Choice: [place] arrangements for the past 2 months: Single Family Home Expected Discharge Date: 11/25/18               DME Arranged: Wheelchair manual, Walker rolling, 3-N-1(hoyer lift) DME Agency: [employer] Date DME Agency Contacted: 11/25/18 Time DME Agency Contacted: [phone] Representative spoke with at DME Agency: [place]             Social Determinants of Health ([place]) Interventions    Readmission Risk Interventions No flowsheet data found.

## 2018-11-26 NOTE — Consult Note (Addendum)
Consultation  Referring Provider: Fayne Mediate MD Primary Care Physician:  [facility] Primary Gastroenterologist:  Dr.Aleysha Meckler  CC; Hematemesis  HPI: Aaron Vega is a 23 y.o. male, who was seen in consultation, by GI, last week while he was admitted with fecal impaction..  Patient was discharged home yesterday, however patient had loose incontinent stool, outside the hospital while trying to be placed in the car to go home.  He was also very weak and unable to stand on his own.  He was transported back to the room, and then after that had some episodes of nausea and vomiting with hematemesis.  We are asked to reconsult regarding the hematemesis. Patient has history of severe autism, and is nonverbal.  His family says he is very independent at home.  Also with morbid obesity, seizure disorder, sleep apnea, and Tourette's. Last week during admission he underwent flexible sigmoidoscopy with disimpaction, and has been having loose stools since.  He is currently on a regimen of Linzess 290 mcg daily, Dulcolax 2 daily and MiraLAX twice daily.  Family showed me pictures of the hematemesis today, this was small-volume and bright red, apparently became more maroonish late yesterday.  He has not had any further hematemesis overnight. Patient had very recent EGD done on 11/08/2018 per Dr. Hilarie Fredrickson when he was admitted with nausea and vomiting.  He was found to have grade B esophagitis and some nodular mucosa in the esophagus otherwise negative exam.  Biopsies were consistent with reflux esophagitis, there was no evidence of H. pylori.  He has been on twice daily Protonix and Carafate suspension between meals and at bedtime. Hemoglobin yesterday was 14.2, down to 12.8 today.  Patient's mother is very frustrated this morning and angry.  She feels that he was treated poorly yesterday and pushed out of the hospital.  She is also very frustrated with the care he received while trying  to get him into the car etc. and feels that he was significantly disrespected. She feels that he is doing much better from a GI standpoint, she does not feel that he is having any abdominal discomfort that his bowels are moving well.  She is not concerned about the amount of blood that he vomited. Her main concern is his progressive weakness that he has developed since he has been hospitalized, and his inability to ambulate at this point.  She feels that he has gotten worse since he has been hospitalized.   Past Medical History:  Diagnosis Date  . Autistic disorder, current or active state   . Fatty liver   . Hypertension   . Obesity   . Pancreatitis    at age 29 years  . Pre-diabetes   . Seizures ([place])    as of 11/23/15 - no seizures for 3 month  . Sleep apnea    not on cpap (can't tolerate)  . Tics of organic origin   . Tourette syndrome     Past Surgical History:  Procedure Laterality Date  . BIOPSY  11/08/2018   Procedure: BIOPSY;  Surgeon: Jerene Bears, MD;  Location: [facility];  Service: Gastroenterology;;  . ESOPHAGOGASTRODUODENOSCOPY (EGD) WITH PROPOFOL N/A 11/08/2018   Procedure: ESOPHAGOGASTRODUODENOSCOPY (EGD) WITH PROPOFOL;  Surgeon: Jerene Bears, MD;  Location: [facility];  Service: Gastroenterology;  Laterality: N/A;  . FLEXIBLE SIGMOIDOSCOPY N/A 11/21/2018   Procedure: FLEXIBLE SIGMOIDOSCOPY;  Surgeon: Irving Copas., MD;  Location: [facility];  Service: Gastroenterology;  Laterality: N/A;  . IMPACTION REMOVAL  11/21/2018   Procedure: IMPACTION REMOVAL;  Surgeon: Irving Copas., MD;  Location: [facility];  Service: Gastroenterology;;  . RADIOLOGY WITH ANESTHESIA N/A 11/24/2015   Procedure: CT SCAN ABDOMEN AND PELVIS WITH CONTRAST;  Surgeon: Medication Radiologist, MD;  Location: [facility];  Service: Radiology;  Laterality: N/A;  . TONSILLECTOMY     and adenoidectomy    Prior to Admission medications   Medication Sig Start Date End Date  Taking? Authorizing Provider  acetaminophen (TYLENOL) 650 MG suppository Place 650 mg rectally every 6 (six) hours as needed. 11/05/18  Yes [provider]  citalopram (CELEXA) 40 MG tablet Take 40 mg by mouth daily. 04/24/14  Yes [provider]  cloBAZam (ONFI) 20 MG tablet Take 60 mg by mouth 2 (two) times daily. 10/16/18  Yes [provider]  diazepam (DIASAT) 20 MG GEL Place 20 mg rectally daily as needed (for seizures lasting more than 5 minutes.).  07/03/13  Yes [provider]  diazepam (VALIUM) 5 MG tablet Take 7.5 mg by mouth every morning. May give 0.5 tablet (2.2m) as needed for agitation/anxiety 10/15/18  Yes [provider]  lisinopril (PRINIVIL,ZESTRIL) 20 MG tablet Take 20 mg by mouth daily. Take with 532mtablet for a total dose of 2515m/22/16  Yes [provider]  lisinopril (ZESTRIL) 5 MG tablet Take 5 mg by mouth daily. Take with 68m9mblet for a total dose of 25mg22m0/20  Yes [provider]  Melatonin 5 MG TABS Take 5 mg by mouth at bedtime.   Yes [provider]  Midazolam (NAYZILAM) 5 MG/0.1ML SOLN Place 5 mg into the nose daily as needed (seizure).   Yes [provider]  nystatin (MYCOSTATIN/NYSTOP) 100000 UNIT/GM POWD Apply 1 application topically daily. Neck, crevices, etc. 04/23/14  Yes [provider]  omeprazole (FIRST-OMEPRAZOLE) 2 mg/mL SUSP oral suspension Take 20 mLs (40 mg total) by mouth daily before breakfast. 11/08/18  Yes Dhungel, Nishant, MD  VIMPAT 10 MG/ML oral solution Take 10 mLs by mouth 2 (two) times daily. 10/28/18  Yes [provider]  levothyroxine (SYNTHROID) 25 MCG tablet Take 1 tablet (25 mcg total) by mouth daily before breakfast. 11/25/18   NetteMariel Aloe linaclotide ([facility] MCG CAPS capsule Take 1 capsule (290 mcg total) by mouth daily before breakfast. 11/26/18   NetteMariel Aloe ondansetron (ZOFRAN ODT) 4 MG disintegrating tablet Take 1  tablet (4 mg total) by mouth every 8 (eight) hours as needed for nausea or vomiting. 11/25/18   NetteMariel Aloe ondansetron (ZOFRAN) 4 MG tablet Take 1 tablet (4 mg total) by mouth every 8 (eight) hours as needed for nausea or vomiting. Patient not taking: Reported on 11/20/2018 11/08/18 11/08/19  Dhungel, NishaFlonnie Overman polyethylene glycol (MIRALAX) 17 g packet Take 17 g by mouth 2 (two) times daily. 11/25/18   NetteMariel Aloe promethazine (PHENERGAN) 25 MG suppository Place 1 suppository (25 mg total) rectally every 6 (six) hours as needed for nausea or vomiting. 11/12/18   GibboKinnie FeilC    Current Facility-Administered Medications  Medication Dose Route Frequency Provider Last Rate Last Dose  . bisacodyl (DULCOLAX) EC tablet 10 mg  10 mg Oral BID LemmoEllouise NewereLima  [place] mg at 11/24/18 0919 2831italopram (CELEXA) tablet 40 mg  40 mg Oral Daily MatheGeorgette Shell  40 mg at 11/25/18 0936  . cloBAZam (ONFI) tablet 60 mg  60 mg Oral BID Georgette Shell, MD   60 mg at 11/25/18 2129  . dextrose 5 %-0.45 % sodium chloride infusion   Intravenous Continuous Mariel Aloe, MD 100 mL/hr at 11/26/18 0600    . diazepam (DIASTAT ACUDIAL) rectal kit 20 mg  20 mg Rectal Daily PRN Georgette Shell, MD      . diazepam (VALIUM) tablet 7.5 mg  7.5 mg Oral Daily Georgette Shell, MD   7.5 mg at 11/25/18 0936  . lacosamide (VIMPAT) 100 mg in sodium chloride 0.9 % 25 mL IVPB  100 mg Intravenous Q12H Gardiner Barefoot, NP 70 mL/hr at 11/26/18 0820 100 mg at 11/26/18 0820  . levothyroxine (SYNTHROID) tablet 25 mcg  25 mcg Oral Q0600 Mariel Aloe, MD   25 mcg at 11/26/18 0516  . linaclotide (LINZESS) capsule 290 mcg  290 mcg Oral QAC breakfast Ellouise Newer [place], [place]   290 mcg at 11/25/18 0944  . lisinopril (ZESTRIL) tablet 25 mg  25 mg Oral Daily Mariel Aloe, MD   25 mg at 11/25/18 0935  . LORazepam (ATIVAN) injection 2 mg  2 mg Intravenous Q6H PRN Georgette Shell, MD   2 mg at 11/25/18 1830  . Midazolam SOLN 5 mg  5 mg Nasal Daily PRN Georgette Shell, MD   5 mg at 11/20/18 2114  . ondansetron (ZOFRAN) injection 4 mg  4 mg Intravenous Q6H PRN Omar Person, NP   4 mg at 11/25/18 1830  . pantoprazole (PROTONIX) injection 40 mg  40 mg Intravenous Q12H Mariel Aloe, MD   40 mg at 11/25/18 2129  . polyethylene glycol (MIRALAX / GLYCOLAX) packet 17 g  17 g Oral BID Ellouise Newer [place], [place]   17 g at 11/25/18 1638  . sucralfate (CARAFATE) 1 GM/10ML suspension 1 g  1 g Oral TID WC & HS Mariel Aloe, MD   1 g at 11/25/18 2135    Allergies as of 11/20/2018  . (No Known Allergies)    Family History  Problem Relation Age of Onset  . Seizures Maternal Aunt   . Anxiety disorder Paternal Uncle   . Depression Paternal Uncle   . Bipolar disorder Paternal Uncle   . Seizures Cousin        Maternal 1st and 2nd cousins have seizures  . Anxiety disorder Other        Paternal fhx  . Depression Other        Paternal fhx  . Hypertension Other        Paternal fhx  . Bipolar disorder Other        Paternal fhx    Social History   Socioeconomic History  . Marital status: Single    Spouse name: Not on file  . Number of children: Not on file  . Years of education: Not on file  . Highest education level: Not on file  Occupational History  . Not on file  Social Needs  . Financial resource strain: Not on file  . Food insecurity    Worry: Not on file    Inability: Not on file  . Transportation needs    Medical: Not on file    Non-medical: Not on file  Tobacco Use  . Smoking status: Never Smoker  . Smokeless tobacco: Never Used  Substance and Sexual Activity  . Alcohol use: Never    Frequency: Never  . Drug use: Never  . Sexual activity: Not on  file  Lifestyle  . Physical activity    Days per week: Not on file    Minutes per session: Not on file  . Stress: Not on file  Relationships  . Social Herbalist on  phone: Not on file    Gets together: Not on file    Attends religious service: Not on file    Active member of club or organization: Not on file    Attends meetings of clubs or organizations: Not on file    Relationship status: Not on file  . Intimate partner violence    Fear of current or ex partner: Not on file    Emotionally abused: Not on file    Physically abused: Not on file    Forced sexual activity: Not on file  Other Topics Concern  . Not on file  Social History Narrative  . Not on file    Review of Systems: Pt unable to offer - severe Autism  Physical Exam: Vital signs in last 24 hours: Temp:  [98 F (36.7 C)-98.3 F (36.8 C)] 98.3 F (36.8 C) (11/02 0518) Pulse Rate:  [120-127] 122 (11/02 0518) Resp:  [18-20] 18 (11/02 0518) BP: (133-150)/(64-100) 133/73 (11/02 0518) SpO2:  [97 %-98 %] 97 % (11/02 0518) Last BM Date: 11/24/18 General:   Alert,  Well-developed, morbidly obese young white male, sleeping did not arouse to exam  head:  Normocephalic and atraumatic. Eyes:  Sclera clear, no icterus.   Conjunctiva pink. Ears:  Normal auditory acuity. Nose:  No deformity, discharge,  or lesions. Mouth:  No deformity or lesions.   Neck:  Supple; no masses or thyromegaly. Lungs:  Clear throughout to auscultation.   No wheezes, crackles, or rhonchi. Heart:  Regular rate and rhythm; no murmurs, clicks, rubs,  or gallops. Abdomen:  Soft, obese nontender, BS active,nonpalp mass or hsm.   Rectal:  Deferred  Msk:  Symmetrical without gross deformities. . Pulses:  Normal pulses noted. Extremities:  Without clubbing or edema. Neurologic:  Alert and  oriented x4;  grossly normal neurologically. Skin:  Intact without significant lesions or rashes.. Psych: Sleeping and sedated  Intake/Output from previous day: 11/01 0701 - 11/02 0700 In: 1449.4 [I.V.:1379.4; IV Piggyback:70] Out: 406 [Urine:400; Emesis/NG output:3; Stool:3] Intake/Output this shift: No intake/output data  recorded.  Lab Results: Recent Labs    11/25/18 1526 11/26/18 0536  WBC 12.7* 10.0  HGB 14.2 12.8*  HCT 42.8 39.8  PLT 363 386   BMET Recent Labs    11/24/18 0659  NA 137  K 3.6  CL 99  CO2 23  GLUCOSE 96  BUN 10  CREATININE 0.48*  CALCIUM 9.5   LFT Recent Labs    11/24/18 0659  PROT 7.0  ALBUMIN 4.1  AST 44*  ALT 87*  ALKPHOS 68  BILITOT 1.9*   PT/INR No results for input(s): LABPROT, INR in the last 72 hours. Hepatitis Panel No results for input(s): HEPBSAG, HCVAB, HEPAIGM, HEPBIGM in the last 72 hours.     IMPRESSION:  #71 23 year old white male with severe autism, nonverbal, who was admitted last week with fecal impaction and severe constipation.  He required flexible sigmoidoscopy for disimpaction, and had been improving from that standpoint when he was discharged yesterday.  #2 self-limited hematemesis-this is in the setting of known grade B esophagitis, and very recent EGD done for nausea and vomiting on 11/08/2018. Patient did not have large-volume hematemesis and suspect the drop in hemoglobin was multifactoral.  His  bleeding is not consistent with that which would be seen with Mallory-Weiss tear.  Bleeding is secondary to active esophagitis.  #3 morbid obesity 4.  Seizure disorder 5.  Deconditioning.  Patient had apparently been weak at home over the past few weeks and having some trouble with his balance.  He has had decline in ability to care for himself and ambulate over the past week or so.  Plan; regular diet as tolerated Can leave on IV PPI twice daily for now, convert to oral PPI, likely omeprazole 40 mg twice daily.  Capsule needs to be opened and sprinkled on applesauce or yogurt as patient does not swallow pills. Continue Carafate suspension 1 g between meals and at bedtime It would help to elevate head of the bed and also get the patient out of bed and up to a chair. We will continue Dulcolax 2 daily, and Linzess to 290 mcg currently,  decrease MiraLAX to 17 g in 8 ounces of water once daily.  This may need to be titrated back further depending on number of bowel movements.  We will check back tomorrow.     Amy Esterwood PA-C 11/26/2018, 8:55 AM   Attending physician's note   I have taken an interval history, reviewed the chart and examined the patient. I agree with the Advanced Practitioner's note, impression and recommendations.   Spent over 1 hour answering patient's mother's questions and reassuring.  36 year old with severe autism admitted with fecal impaction s/p manual, endoscopic (FS) disimpaction, currently on OTC laxatives.  Doing well from GI standpoint.  Also longstanding history of intermittent nausea/vomiting.  EGD 11/08/2018 showed grade B esophagitis.  Has mild intermittent hematemesis (expected). Hb stable.  He was discharged yesterday.  He was unable to stand d/t deconditioning and was readmitted.  Plan: -Continue Dulcolax 2/day, decrease MiraLAX 17 g p.o. once a day (having significant diarrhea), continue Linzess 290 for now.  If still with diarrhea can stop Linzess. -Continue IV PPI twice daily.  Once able to tolerate p.o., change to omeprazole 40 mg p.o. twice daily. -Continue Carafate suspension 1 g 3 times daily x 2 weeks. -Follow-up with me as an outpatient in 3 to 4 weeks. -PT consult for ambulation. -No endoscopic procedures planned. -We will sign off for now.  Please call with any questions.   Carmell Austria, MD Velora Heckler Fabienne Bruns [phone]

## 2018-11-26 NOTE — Progress Notes (Signed)
Physical Therapy Treatment Patient Details Name: Aaron Vega MRN: [id number] DOB: [birth year] Today's Date: 11/26/2018    History of Present Illness Pt is 23 yo male with medical history significant of severe autism, nonverbal, morbid obesity, and seizures.  He has had constipation for 4 weeks.  Pt s/p flexible sigmoidscopy with disimpaction on 10/28.  Pt had NG tube that he removed on 10/28.  Pt had multiple falls in hospital    PT Comments    Pt requiring total assist today and was not able/willing to assist with transfers. He did attempt to pivot to chair , but in very unsafe manner. He does have decreased strength and mobility; however, remains largely limited by his willingness to participate and ability to follow commands.  Pt requiring SNF level of care but family refuses.  Will need equipment listed below if going home (notified social work).    Follow Up Recommendations  SNF((Family adamently refuses SNF, so HHPT/OT))     Equipment Recommendations  Wheelchair (measurements PT);3in1 (PT);Hospital bed(hoyer lift, EMS transport)    Recommendations for Other Services       Precautions / Restrictions Precautions Precautions: Fall    Mobility  Bed Mobility Overal bed mobility: Needs Assistance             General bed mobility comments: Pt was at EOB upon arrival.  Mother was present and reports pt did this by himself.  States he has been trying to get up and she has sat close to keep him in the bed.  Transfers Overall transfer level: Needs assistance   Transfers: Squat Pivot Transfers     Squat pivot transfers: Total assist     General transfer comment: Pt not participating with sit to stand despite multiple attempts and methods to motivate.  He started to reach for chair for transfer but was very unsafe.  Required max x 2/total squat pivot to chair with bed elevated.  Ambulation/Gait                 Stairs             Wheelchair Mobility     Modified Rankin (Stroke Patients Only)       Balance Overall balance assessment: Needs assistance Sitting-balance support: Bilateral upper extremity supported;Feet supported Sitting balance-[name]: Fair         Standing balance comment: unable                            Cognition Arousal/Alertness: Awake/alert Behavior During Therapy: Impulsive;Agitated Overall Cognitive Status: Impaired/Different from baseline Area of Impairment: Following commands                               General Comments: Pt with severe autism, unable to assess orientation.  He followed no commands today. Poor safety awareness.  Mother and Grandmother present.      Exercises      General Comments General comments (skin integrity, edema, etc.): Pt's mother and grandmother present and attempted to get pt to participate but unable.  Spent increased time encouraging pt to participate, but unable.  Discussed recommendations for SNF with family due to pt requiring total care, but they adamantly refuse.  They feel pt may do better in his own environment (which may very well be true) and don't want increased COVID risk.  Discussed if going home would need 24 hr care, hospital  bed, w/c, bsc, hoyer lift, and EMS transport - as well as HHPT/OT.   Family asking how to help improve pt's strength and mobility.  Discussed that pt is weak but his biggest barrier is his willingness to participate.  They asked about exercises.  Discussed pt only participating with PROM which can assist with ROM and blood flow but will not improve strength.  Attempted AROM with tactile and visual cues but pt not participating.  Gave T-band and demonstrated with family incase they could motivate pt to use.  Also, encouraged them if they could get him to participate with AROM (kicking legs, heel slides, etc) that would be great and beneficial.       Pertinent Vitals/Pain Pain Assessment: Faces Faces Pain Scale:  Hurts a little bit    Home Living                      Prior Function            PT Goals (current goals can now be found in the care plan section) Progress towards PT goals: Not progressing toward goals - comment(limited by willingness to participate)    Frequency    Min 3X/week      PT Plan Discharge plan needs to be updated    Co-evaluation              AM-PAC PT "6 Clicks" Mobility   Outcome Measure  Help needed turning from your back to your side while in a flat bed without using bedrails?: A Lot Help needed moving from lying on your back to sitting on the side of a flat bed without using bedrails?: A Lot Help needed moving to and from a bed to a chair (including a wheelchair)?: Total Help needed standing up from a chair using your arms (e.g., wheelchair or bedside chair)?: Total Help needed to walk in hospital room?: Total Help needed climbing 3-5 steps with a railing? : Total 6 Click Score: 8    End of Session Equipment Utilized During Treatment: Gait belt Activity Tolerance: Patient limited by fatigue;Treatment limited secondary to agitation(limited by williningness to participate) Patient left: in chair;with chair alarm set;with family/visitor present(hoyer pad under pt) Nurse Communication: Mobility status PT Visit Diagnosis: Unsteadiness on feet (R26.81);Other abnormalities of gait and mobility (R26.89);Muscle weakness (generalized) (M62.81);History of falling (Z91.81)     Time: 1410-1444 PT Time Calculation (min) (ACUTE ONLY): 34 min  Charges:  $Therapeutic Activity: 23-37 mins                     Maggie Font, PT Acute Rehab Services [phone]    Karlton Lemon 11/26/2018, 3:42 PM

## 2018-11-26 NOTE — Progress Notes (Signed)
PT Cancellation Note  Patient Details Name: Aaron Vega MRN: [id number] DOB: Jun 12, 1995   Cancelled Treatment:    Reason Eval/Treat Not Completed: Fatigue/lethargy limiting ability to participate  Spoke with RN who reports pt is asleep and request to let him rest as he just went to sleep.  Will f/u in afternoon.   Mikael Spray Gertha Lichtenberg 11/26/2018, 11:15 AM

## 2018-11-26 NOTE — Progress Notes (Signed)
PROGRESS NOTE    Aaron Vega  MEQ:[id number] DOB: [birth year] DOA: 11/20/2018 PCP: [facility]   Brief Narrative: Aaron Vega is a 23 y.o. male with a history of autism, seizure disorder, obesity. Patient presented secondary to constipation and decreased mobility. Concern for severe constipation/impaction. GI consulted for management. Patient underwent flexible sigmoidoscopy with lavage. Having bowel movements now but prior to discharge, had an episode of hematemesis.   Assessment & Plan:   Active Problems:   Constipation   Abnormal LFTs   Hyperglycemia   Obesity, Class III, BMI 40-49.9 (morbid obesity) (HCC)   Hematemesis   Constipation/impaction Clinically significant. No BM in several weeks per mother. Imaging consistent with mild stool burden from recent CT performed at OSH in addition to abdominal x-ray performed on admission. GI consulted and is planning on flexible sigmoidoscopy for management. Patient not adherent with medication management secondary to behavioral diagnoses. Flexible sigmoidoscopy performed on 10/28 and significant for stool in the entire examined colon; lavage performed. NG placed with bowel prep administered but now NG tube pulled out. Now with nausea/vomiting. Lipase normal. He has had a bowel movement on his own. -Continue Linzess, Miralax  Hematemesis Patient with a history of esophagitis. 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 Pylori, dysplasia or malignancy. -CBC daily -GI recommendations: Continue IV protonix, carafate  Weakness Patient had a witness/controlled fall while inpatient. No neurological deficits noted on exam. Weakness has been progressing over the past 4+ weeks per mother. During attempted discharge, patient apparently had another controlled fall while trying to transfer from wheelchair to personal vehicle.  Patient would not qualify for CIR secondary to behavioral limitations. -PT recommending HHPT, wheelchair, 3 in 1, wide RW; family declined SNF -Will try to maximize [facility]. Hoyer lift order already placed and CM working to see if it will be approved -Continue PT as able  Elevated TSH On chart review, he has had a recently normal free T4 with elevated TSH. It is possible he has subclinical hypothyroidism. Normal free T3/T4. -Continue Synthroid 25 mcg daily (new prescription) and have patient follow-up with PCP -Lacosamide level  Seizure disorder Patient with a seizure on 10/27. Patient has not received his AED medication secondary to recent vomiting. Seizure resolved with Versed. Currently baseline -Continue clobazam, Vimpat -Continue diazepam and midazolam prn  Dehydration In setting of vomiting and decreased PO intake. Improved with IV fluids. No recurrent emesis overnight. Patient still with poor oral intake.  Tachycardia Appears to be chronic. Has not responded to IV fluids and was present on chart review from previous hospital/provider encounters. Sinus tachycardia. May need outpatient follow-up.  Elevated AST/ALT Likely secondary to AED medications. Recent CT scan without liver/gallbladder pathology. RUQ ultrasound shows hepatic steatosis with no ductal dilation or gallbladder pathology. Stable.  Obesity Body mass index is 48.44 kg/m.   Hyperglycemia Mild.  Essential hypertension Patient is on lisinopril as an outpatient. BP is currently elevated. -Continue lisinopril 25 mg daily  Autism/Behavioral -Continue Celexa -Continue diazepam prn  Rash Appears to be heat rash. Asymptomatic. Keep area cool and dry.  Concern regarding discharge Patient's mother is unhappy with regard to discharge attempt on 11/1 as mentioned in subjective. Discharge planning was performed daily. On day of discharge, discussed my concerns with regard to the patient going home in his physically  debilitated state in addition to him having an episode of emesis the morning of discharge. Patient's mother indicated to me at  the time that she felt home would be the best environment for her son and that he would likely improve functionally; she also stated he would likely eat more when in a more familiar environment (referenced his red bowl and Cheerios cereal). She declined to wait to see if patient would tolerate his lunch as she felt the Zofran had helped manage his emesis. We also discussed his physical requirements and the patient's mother expressed that she had already arranged for her parents (mother and father) to be in the house at the time of discharge to help with taking care of the patient. She declined SNF on previous recommendation. At the time of discharge, the patient's mother appeared to understand my concerns and still preferred patient at home.   DVT prophylaxis: SCDs Code Status:   Code Status: Full Code Family Communication: Mother at bedside Disposition Plan: Discharge pending improvement of vomiting/bowel movement   Consultants:   [facility]  Procedures:   None  Antimicrobials:  None    Subjective: Patient's mother angry about events on 11/1 regard planned discharge. States that her should not have been discharged because he cannot ambulate on his own at this time. States that he was dehydrated and should not have been discharged. States that after he had his episode of emesis, that I did not evaluate her son at bedside.  No further hematemesis overnight. No vomiting.  Objective: Vitals:   11/25/18 1925 11/25/18 2040 11/26/18 0518 11/26/18 1415  BP: (!) 150/100 137/64 133/73 (!) 147/97  Pulse: (!) 120 (!) 120 (!) 122 (!) 125  Resp: Temp: 98 F (36.7 C)  98.3 F (36.8 C) 99.3 F (37.4 C)  TempSrc: Oral  Oral Oral  SpO2: 97%  97% 97%  Weight:      Height:        Intake/Output Summary (Last 24 hours) at 11/26/2018 1458 Last data filed at  11/26/2018 1400 Gross per 24 hour  Intake 1269.44 ml  Output 406 ml  Net 863.44 ml   Filed Weights   11/21/18 1136  Weight: (!) 148.8 kg    Examination:  General exam: Appears calm and comfortable Respiratory system: Clear to auscultation. Respiratory effort normal. Cardiovascular system: S1 & S2 heard, RRR. No murmurs, rubs, gallops or clicks. Gastrointestinal system: Abdomen is nondistended, soft and nontender. No organomegaly or masses felt. Normal bowel sounds heard. Central nervous system: Sleeping. Extremities: No edema. No calf tenderness Skin: No cyanosis. No rashes   Data Reviewed: I have personally reviewed following labs and imaging studies  CBC: Recent Labs  Lab 11/20/18 1436 11/23/18 0917 11/25/18 1526 11/26/18 0536  WBC 8.2 6.1 12.7* 10.0  NEUTROABS 5.3  --   --   --   HGB 14.1 12.7* 14.2 12.8*  HCT 43.1 39.7 42.8 39.8  MCV 85.0 85.9 84.1 86.3  PLT 389 336 363 386   Basic Metabolic Panel: Recent Labs  Lab 11/20/18 1436 11/21/18 1519 11/23/18 0917 11/24/18 0659  NA 137 135 137 137  K 3.9 3.2* 3.7 3.6  CL 99 100 100 99  CO2 GLUCOSE 109* 104* 96 96  BUN CREATININE 0.73 0.66 0.54* 0.48*  CALCIUM 9.7 9.0 9.3 9.5  MG  --  1.8  --   --    GFR: Estimated Creatinine Clearance: 210.5 mL/min (A) (by C-G formula based on SCr of 0.48 mg/dL (L)). Liver Function Tests: Recent Labs  Lab 11/20/18 1436  11/21/18 1519 11/23/18 0917 11/24/18 0659  AST 75* 50* 53* 44*  ALT 112* 91* 93* 87*  ALKPHOS 78 64 67 68  BILITOT 1.4* 0.9 1.2 1.9*  PROT 7.8 6.7 6.9 7.0  ALBUMIN 4.6 3.9 3.9 4.1   Recent Labs  Lab 11/21/18 1519 11/24/18 0659  LIPASE 25 32   No results for input(s): AMMONIA in the last 168 hours. Coagulation Profile: No results for input(s): INR, PROTIME in the last 168 hours. Cardiac Enzymes: No results for input(s): CKTOTAL, CKMB, CKMBINDEX, TROPONINI in the last 168 hours. BNP (last 3 results) No results for  input(s): PROBNP in the last 8760 hours. HbA1C: No results for input(s): HGBA1C in the last 72 hours. CBG: No results for input(s): GLUCAP in the last 168 hours. Lipid Profile: No results for input(s): CHOL, HDL, LDLCALC, TRIG, CHOLHDL, LDLDIRECT in the last 72 hours. Thyroid Function Tests: No results for input(s): TSH, T4TOTAL, FREET4, T3FREE, THYROIDAB in the last 72 hours. Anemia Panel: No results for input(s): VITAMINB12, FOLATE, FERRITIN, TIBC, IRON, RETICCTPCT in the last 72 hours. Sepsis Labs: No results for input(s): PROCALCITON, LATICACIDVEN in the last 168 hours.  Recent Results (from the past 240 hour(s))  SARS CORONAVIRUS 2 (TAT 6-24 HRS) Nasopharyngeal Nasopharyngeal Swab     Status: None   Collection Time: 11/20/18  6:43 PM   Specimen: Nasopharyngeal Swab  Result Value Ref Range Status   SARS Coronavirus 2 NEGATIVE NEGATIVE Final    Comment: (NOTE) SARS-CoV-2 target nucleic acids are NOT DETECTED. The SARS-CoV-2 RNA is generally detectable in upper and lower respiratory specimens during the acute phase of infection. Negative results do not preclude SARS-CoV-2 infection, do not rule out co-infections with other pathogens, and should not be used as the sole basis for treatment or other patient management decisions. Negative results must be combined with clinical observations, patient history, and epidemiological information. The expected result is Negative. Fact Sheet for Patients: HairSlick.no Fact Sheet for Healthcare Providers: quierodirigir.com This test is not yet approved or cleared by the [place] FDA and  has been authorized for detection and/or diagnosis of SARS-CoV-2 by FDA under an Emergency Use Authorization (EUA). This EUA will remain  in effect (meaning this test can be used) for the duration of the COVID-19 declaration under Section 56 4(b)(1) of the Act, 21 U.S.C. section 360bbb-3(b)(1),  unless the authorization is terminated or revoked sooner. Performed at [facility], [address] [place], [place] [address]          Radiology Studies: Ct Abdomen Pelvis Wo Contrast  Result Date: 11/24/2018 CLINICAL DATA:  Constipation. Possible fecal impaction. Two episodes of diarrhea today. EXAM: CT ABDOMEN AND PELVIS WITHOUT CONTRAST TECHNIQUE: Multidetector CT imaging of the abdomen and pelvis was performed following the standard protocol without IV contrast. COMPARISON:  11/03/2018 FINDINGS: Lower chest: Clear lung bases.  Heart normal in size. Hepatobiliary: Decreased attenuation of the liver consistent with fatty infiltration. No liver mass or focal lesion. Normal gallbladder. No bile duct dilation. Pancreas: Unremarkable. No pancreatic ductal dilatation or surrounding inflammatory changes. Spleen: Normal in size without focal abnormality. Adrenals/Urinary Tract: Adrenal glands are unremarkable. Kidneys are normal, without renal calculi, focal lesion, or hydronephrosis. Bladder is unremarkable. Stomach/Bowel: Rectum and lower sigmoid colon are mildly distended with stool. Stool is mostly liquid the 2 air-fluid level in the sigmoid colon. There are additional air-fluid levels throughout the colon. No colonic wall thickening. No inflammation. Normal stomach. Small bowel is normal in caliber. No wall thickening  or inflammation. Normal appendix visualized. Vascular/Lymphatic: No significant vascular findings are present. No enlarged abdominal or pelvic lymph nodes. Reproductive: Unremarkable. Other: No abdominal wall hernia or abnormality. No abdominopelvic ascites. Musculoskeletal: No acute or significant osseous findings. IMPRESSION: 1. No constipation. There is predominantly liquid stool in the colon leading to air-fluid levels, mildly distending the lower sigmoid colon and rectum. 2. No bowel inflammation. 3. Hepatic steatosis.  No other abnormalities. Electronically Signed   By:  Amie Portlandavid  Ormond M.D.   On: 11/24/2018 17:36        Scheduled Meds:  bisacodyl  10 mg Oral BID   citalopram  40 mg Oral Daily   cloBAZam  60 mg Oral BID   diazepam  7.5 mg Oral Daily   levothyroxine  25 mcg Oral Q0600   linaclotide  290 mcg Oral QAC breakfast   lisinopril  25 mg Oral Daily   ondansetron (ZOFRAN) IV  4 mg Intravenous Q6H   pantoprazole (PROTONIX) IV  40 mg Intravenous Q12H   polyethylene glycol  17 g Oral Daily   sucralfate  1 g Oral TID WC & HS   Continuous Infusions:  dextrose 5 % and 0.45% NaCl 100 mL/hr at 11/26/18 0600   lacosamide (VIMPAT) IV 100 mg (11/26/18 0820)     LOS: 5 days     Jacquelin Hawkingalph Cem Kosman, MD [facility] 11/26/2018, 2:58 PM  If 7PM-7AM, please contact night-coverage [email or web address]

## 2018-11-26 NOTE — Progress Notes (Signed)
   11/26/18 0518  MEWS Score  Resp 18  Pulse Rate (!) 122  BP 133/73  Temp 98.3 F (36.8 C)  SpO2 97 %  O2 Device Room Air  MEWS Score  MEWS RR 0  MEWS Pulse 2  MEWS Systolic 0  MEWS LOC 0  MEWS Temp 0  MEWS Score 2  MEWS Score Color Yellow  MEWS Assessment  Is this an acute change? No   Pts pulse has remained elevated throughout his hospital stay with no s/s of pain noted.  Pt is resting in bed comfortably with mother at the bedside pt is in NAD at this time. Will continue to monitor

## 2018-11-27 DIAGNOSIS — K5900 Constipation, unspecified: Secondary | ICD-10-CM

## 2018-11-27 DIAGNOSIS — K920 Hematemesis: Secondary | ICD-10-CM

## 2018-11-27 LAB — BASIC METABOLIC PANEL
Anion gap: 13 (ref 5–15)
BUN: 8 mg/dL (ref 6–20)
CO2: 23 mmol/L (ref 22–32)
Calcium: 9.1 mg/dL (ref 8.9–10.3)
Chloride: 101 mmol/L (ref 98–111)
Creatinine, Ser: 0.49 mg/dL — ABNORMAL LOW (ref 0.61–1.24)
GFR calc Af Amer: >60 mL/min (ref >60)
GFR calc non Af Amer: >60 mL/min (ref >60)
Glucose, Bld: 125 mg/dL — ABNORMAL HIGH (ref 70–99)
Potassium: 3.2 mmol/L — ABNORMAL LOW (ref 3.5–5.1)
Sodium: 137 mmol/L (ref 135–145)

## 2018-11-27 LAB — CBC
HCT: 39.4 % (ref 39.0–52.0)
Hemoglobin: 12.6 g/dL — ABNORMAL LOW (ref 13.0–17.0)
MCH: 27.7 pg (ref 26.0–34.0)
MCHC: 32 g/dL (ref 30.0–36.0)
MCV: 86.6 fL (ref 80.0–100.0)
Platelets: 331 10*3/uL (ref 150–400)
RBC: 4.55 MIL/uL (ref 4.22–5.81)
RDW: 12.6 % (ref 11.5–15.5)
WBC: 7.3 10*3/uL (ref 4.0–10.5)
nRBC: 0 % (ref 0.0–0.2)

## 2018-11-27 LAB — LACOSAMIDE: Lacosamide: 4.7 ug/mL — ABNORMAL LOW (ref 5.0–10.0)

## 2018-11-27 MED ORDER — OMEPRAZOLE 40 MG PO CPDR: 40.0000 mg | DELAYED_RELEASE_CAPSULE | Freq: Two times a day (BID) | ORAL | 0 refills | Status: AC

## 2018-11-27 MED ORDER — SUCRALFATE 1 GM/10ML PO SUSP: 1.0000 g | Freq: Three times a day (TID) | ORAL | 0 refills | Status: AC

## 2018-11-27 NOTE — Progress Notes (Signed)
Patient ID: Aaron Vega, male   DOB: [birth year], 23 y.o.   MRN: [id number]    Progress Note   Subjective  Day # 6  CC;Hematemesis/ Fecal impaction   Mom at bedside, patient awake, able to pull himself up and sit on the side of the bed and trying to get out of bed.  He had an episode of vomiting while he was in the room, had just had some 7-Up and vomited clear material, no blood.  Patient's mom very concerned about keeping him sedated, and therefore Aaron Vega having no motivation to get up, eat etc.  She feels he would be better off at home with physical therapy and in his own environment.  We also discussed meds.  Mom relates that he was started on Vimpat for seizures at the beginning of September as he had been having some breakthrough seizures.  All of his problems started shortly thereafter, with balance issues, some progressive weakness, then episodes of nausea and vomiting, and not wanting to eat.     hgb 12.6 stable    Objective   Vital signs in last 24 hours: Temp:  [98.7 F (37.1 C)-99.3 F (37.4 C)] 98.7 F (37.1 C) (11/03 0550) Pulse Rate:  [100-125] 100 (11/03 0656) Resp:  [17-18] 18 (11/03 0550) BP: (145-163)/(88-97) 163/97 (11/03 0550) SpO2:  [97 %-99 %] 99 % (11/03 0550) Last BM Date: 11/26/18 General: Obese young   white male in NAD, nonverbal  heart:  Regular rate and rhythm; no murmurs Lungs: Respirations even and unlabored, lungs CTA bilaterally Abdomen:  Soft, obese nontender and nondistended. Normal bowel sounds. Extremities:  Without edema. Neurologic:  Alert  Psych:  Cooperative. Normal mood and affect.  Intake/Output from previous day: 11/02 0701 - 11/03 0700 In: 1088.7 [P.O.:240; I.V.:779.9; IV Piggyback:68.8] Out: 500 [Urine:500] Intake/Output this shift: No intake/output data recorded.  Lab Results: Recent Labs    11/25/18 1526 11/26/18 0536 11/27/18 0524  WBC 12.7* 10.0 7.3  HGB 14.2 12.8* 12.6*  HCT 42.8 39.8 39.4  PLT 363 386 331    BMET Recent Labs    11/26/18 1543 11/27/18 0524  NA 138 137  K 3.1* 3.2*  CL 103 101  CO2 24 23  GLUCOSE 117* 125*  BUN 9 8  CREATININE 0.59* 0.49*  CALCIUM 9.3 9.1   LFT No results for input(s): PROT, ALBUMIN, AST, ALT, ALKPHOS, BILITOT, BILIDIR, IBILI in the last 72 hours. PT/INR No results for input(s): LABPROT, INR in the last 72 hours.  Studies/Results: No results found.     Assessment / Plan:     #20 23 year old white male with severe autism, nonverbal, and with history of seizure disorder, admitted last week with fecal impaction. Patient had been diagnosed with acute grade B esophagitis in mid October.  Patient was to be discharged on 11 1, had an episode of hematemesis and has been kept in the hospital..  No evidence of acute GI bleeding and hemoglobin is stable.  Not unexpected that he would bring up a small amount of blood with acute esophagitis.  I suspect that his change in his seizure meds, or what have precipitated his current issues.  I think he is having adverse side effects from Vimpat with nausea vomiting which likely called such a esophagitis.  He had also been having decline in function with weakness balance disturbance etc.  He may be having vertigo that he cannot express.  Patient's mom would like to take him home and have physical therapy at home.  I encouraged her to call patient's Neurologist at [facility] this morning and relay concerns about adverse effects from Vimpat.  Perhaps he can be placed back on his previous med with tolerated well. I think getting him off Ativan is very important as well.  He is stable from a GI standpoint.  When he is discharged please send out on omeprazole capsules 40 mg twice daily to be opened and sprinkled on applesauce or yogurt. Zofran 4 mg every 6 hours as needed Carafate suspension 1 g between meals x1 month then stop. Linzess 290 mcg once daily, okay to open capsule again and sprinkle with food Mom to titrate  MiraLAX 17 g once to twice daily as needed to keep bowels moving. GI will sign off, available if needed.  Patient has been established with Dr. Chales Abrahams.    Active Problems:   Constipation   Abnormal LFTs   Hyperglycemia   Obesity, Class III, BMI 40-49.9 (morbid obesity) (HCC)   Hematemesis     LOS: 6 days   Aaron Hyson PA-C 11/27/2018, 8:51 AM

## 2018-11-27 NOTE — Progress Notes (Signed)
Pt's mother believes pt would be more motivated to work with PT if he were in their home. States she will have EMS take him home and she and PT would work with him. Patient is large and has frequent falls. Grandparents are involved and can be of some help.

## 2018-11-27 NOTE — Progress Notes (Signed)
    Durable Medical Equipment  (From admission, onward)         Start     Ordered   11/27/18 1226  For home use only DME standard manual wheelchair with seat cushion  Once    Comments: Patient suffers from Autism which impairs their ability to perform daily activities like bathing, dressing, feeding, grooming and toileting in the home.  A cane, crutch or walker will not resolve issue with performing activities of daily living. A wheelchair will allow patient to safely perform daily activities. Patient can safely propel the wheelchair in the home or has a caregiver who can provide assistance. Length of need 6 months . Accessories: elevating leg rests (ELRs), wheel locks, extensions and anti-tippers.  Extra heavy duty and back cushion   11/27/18 1225   11/27/18 1225  For home use only DME 3 n 1  Once    Comments: Heavy duty   11/27/18 1225   11/25/18 1508  For home use only DME Other see comment  Once    Comments: Hoyer lift  Patient needs lift secondary to severe LE weakness/dibilitation and 3+ for lifting. 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  Question:  Length of Need  Answer:  6 Months   11/25/18 1509   11/25/18 1426  For home use only DME Walker rolling  Once    Comments: Wide  Question Answer Comment  Patient needs a walker to treat with the following condition Autism   Patient needs a walker to treat with the following condition Constipation   Patient needs a walker to treat with the following condition Nausea & vomiting      11/25/18 1426

## 2018-11-27 NOTE — TOC Transition Note (Signed)
Transition of Care [facility]) - CM/SW Discharge Note   Patient Details  Name: Aaron Vega MRN: [id number] Date of Birth: 02-Jun-1995  [facility] CM/SW Contact:  Nila Nephew, LCSW Phone Number: [phone] 11/27/2018, 1:43 PM   Clinical Narrative:   Pt's DME (below) approved and being provided by [employer] to his home today. Arranged [facility] transport home due to pt's inability to ambulate/transfer safely.  Discussed with pt's mother that as of yet no home health agency contacted able to accept referral due to staffing and insurance (Medicaid). Mother reports not wanting to wait further on HHPT services, states "we have so many family members who can help [place], and we will reach out to his primary doctor's office for help if we decide we want to look for PT again once we get home."    Final next level of care: [place] Barriers to Discharge: No Barriers Identified   Patient Goals and CMS Choice Patient states their goals for this hospitalization and ongoing recovery are:: to return home (per mother, patient is nonverbal)      Discharge Placement                  Name of family member notified: mother Christon    Discharge Plan and Services   Discharge Planning Services: CM Consult Post Acute Care Choice: Home Health          DME Arranged: (hoyer lift) DME Agency: [employer] Date DME Agency Contacted: 11/25/18 Time DME Agency Contacted: [phone] Representative spoke with at DME Agency: [place]            Social Determinants of Health ([place]) Interventions     Readmission Risk Interventions No flowsheet data found.

## 2018-11-27 NOTE — Progress Notes (Signed)
Pts IV removed with a clean and dry dressing intact, pts mother educated on d/c instructions, follow ups, and medications with all questions answered at the time. Pt is ready for transport via [facility].

## 2018-12-03 ENCOUNTER — Telehealth: Payer: Self-pay | Admitting: Gastroenterology

## 2018-12-03 NOTE — Telephone Encounter (Signed)
Spoke with pts mother and let her know she should contact the pts PCP for the order for hospital bed.

## 2018-12-04 ENCOUNTER — Telehealth: Payer: Self-pay | Admitting: Gastroenterology

## 2018-12-04 NOTE — Telephone Encounter (Signed)
Aaron Vega called back and stated that [name] has lost all motor function.  She stated that pt "cannot move, cannot walk, cannot make a fist and has not been eating or drinking."  She requested for a referral to [facility] for an evaluation.

## 2018-12-04 NOTE — Telephone Encounter (Signed)
Patient's mother is calling back into the office-[name] reports that the patient currently has:BP 176/100; HR 129; no fever, unable to drink anything without vomiting, has "rattling in his chest";  RN advised that she needs to call EMS if she feels like Jsaon is not doing well-[name] reports that Efrain's hands are pulling in towards his body (decorticate posturing)-and "nothing is being done for my son" "all EMS is going to do is give him fluids and tell me to make him comfortable"; [name] reports she does not feel like anyone is doing anything for her son because all she needs is the referral sent to [facility] for the patient;   Please review and advise

## 2018-12-04 NOTE — Telephone Encounter (Signed)
Pt is scheduled for tomorrow with Dr. Lyndel Safe.  Pt will not be able to make appt unless [facility] brings him.  Mother thinks he needs hospice care and wants to know if Dr. Lyndel Safe will order Hospice.  He has only ate a 1/2 of a banana since he was discharged from the hospital.  Mother states she feels like he has had a stroke and "I feel helpless and no one will help me, this is my son we are talking about"

## 2018-12-04 NOTE — Telephone Encounter (Signed)
Called and spoke with patient's mother-verified DPR-[name] reports the patient's condition is declining and the patient has been in/out of the hospital and he is not getting any better-   [name] in requesting 'a referral for [facility]- will Dr. Lyndel Safe please order this" -[name] in needing someone to help with Aaron Vega at the house-patient's urine is dark, he is not eating, he has not had a bowel movement in 6 days Please advise

## 2018-12-04 NOTE — Telephone Encounter (Signed)
I have discussed with the mom in detail over the phone.  Dr. Ovid Curd has called in hospice of [facility] already.  We will await further recommendations.  RG

## 2018-12-08 ENCOUNTER — Encounter (HOSPITAL_COMMUNITY): Payer: Self-pay | Admitting: Emergency Medicine

## 2018-12-08 ENCOUNTER — Inpatient Hospital Stay (HOSPITAL_COMMUNITY)
Admission: EM | Admit: 2018-12-08 | Discharge: 2019-02-25 | DRG: 871 | Disposition: E | Source: Hospice | Attending: Family Medicine | Admitting: Family Medicine

## 2018-12-08 ENCOUNTER — Emergency Department (HOSPITAL_COMMUNITY)

## 2018-12-08 ENCOUNTER — Other Ambulatory Visit: Payer: Self-pay

## 2018-12-08 DIAGNOSIS — J9811 Atelectasis: Secondary | ICD-10-CM | POA: Diagnosis present

## 2018-12-08 DIAGNOSIS — Z79899 Other long term (current) drug therapy: Secondary | ICD-10-CM

## 2018-12-08 DIAGNOSIS — Z79890 Hormone replacement therapy (postmenopausal): Secondary | ICD-10-CM

## 2018-12-08 DIAGNOSIS — G709 Myoneural disorder, unspecified: Secondary | ICD-10-CM | POA: Diagnosis not present

## 2018-12-08 DIAGNOSIS — R6521 Severe sepsis with septic shock: Secondary | ICD-10-CM | POA: Diagnosis present

## 2018-12-08 DIAGNOSIS — G4730 Sleep apnea, unspecified: Secondary | ICD-10-CM | POA: Diagnosis present

## 2018-12-08 DIAGNOSIS — G931 Anoxic brain damage, not elsewhere classified: Secondary | ICD-10-CM | POA: Diagnosis present

## 2018-12-08 DIAGNOSIS — K117 Disturbances of salivary secretion: Secondary | ICD-10-CM

## 2018-12-08 DIAGNOSIS — E561 Deficiency of vitamin K: Secondary | ICD-10-CM | POA: Diagnosis present

## 2018-12-08 DIAGNOSIS — Z431 Encounter for attention to gastrostomy: Secondary | ICD-10-CM

## 2018-12-08 DIAGNOSIS — G9341 Metabolic encephalopathy: Secondary | ICD-10-CM | POA: Diagnosis present

## 2018-12-08 DIAGNOSIS — R000 Tachycardia, unspecified: Secondary | ICD-10-CM | POA: Diagnosis present

## 2018-12-08 DIAGNOSIS — A419 Sepsis, unspecified organism: Principal | ICD-10-CM | POA: Diagnosis present

## 2018-12-08 DIAGNOSIS — R1310 Dysphagia, unspecified: Secondary | ICD-10-CM | POA: Diagnosis present

## 2018-12-08 DIAGNOSIS — R0600 Dyspnea, unspecified: Secondary | ICD-10-CM

## 2018-12-08 DIAGNOSIS — R0602 Shortness of breath: Secondary | ICD-10-CM

## 2018-12-08 DIAGNOSIS — R52 Pain, unspecified: Secondary | ICD-10-CM | POA: Diagnosis present

## 2018-12-08 DIAGNOSIS — J9690 Respiratory failure, unspecified, unspecified whether with hypoxia or hypercapnia: Secondary | ICD-10-CM

## 2018-12-08 DIAGNOSIS — D65 Disseminated intravascular coagulation [defibrination syndrome]: Secondary | ICD-10-CM | POA: Diagnosis not present

## 2018-12-08 DIAGNOSIS — Z0189 Encounter for other specified special examinations: Secondary | ICD-10-CM

## 2018-12-08 DIAGNOSIS — Z66 Do not resuscitate: Secondary | ICD-10-CM | POA: Diagnosis not present

## 2018-12-08 DIAGNOSIS — E872 Acidosis: Secondary | ICD-10-CM | POA: Diagnosis present

## 2018-12-08 DIAGNOSIS — K567 Ileus, unspecified: Secondary | ICD-10-CM

## 2018-12-08 DIAGNOSIS — K760 Fatty (change of) liver, not elsewhere classified: Secondary | ICD-10-CM | POA: Diagnosis not present

## 2018-12-08 DIAGNOSIS — D72829 Elevated white blood cell count, unspecified: Secondary | ICD-10-CM

## 2018-12-08 DIAGNOSIS — K560 Paralytic ileus: Secondary | ICD-10-CM | POA: Diagnosis present

## 2018-12-08 DIAGNOSIS — F840 Autistic disorder: Secondary | ICD-10-CM | POA: Diagnosis present

## 2018-12-08 DIAGNOSIS — E876 Hypokalemia: Secondary | ICD-10-CM | POA: Diagnosis not present

## 2018-12-08 DIAGNOSIS — J9621 Acute and chronic respiratory failure with hypoxia: Secondary | ICD-10-CM | POA: Diagnosis present

## 2018-12-08 DIAGNOSIS — Z9981 Dependence on supplemental oxygen: Secondary | ICD-10-CM

## 2018-12-08 DIAGNOSIS — T17908A Unspecified foreign body in respiratory tract, part unspecified causing other injury, initial encounter: Secondary | ICD-10-CM | POA: Diagnosis present

## 2018-12-08 DIAGNOSIS — Z20822 Contact with and (suspected) exposure to covid-19: Secondary | ICD-10-CM | POA: Diagnosis present

## 2018-12-08 DIAGNOSIS — I9589 Other hypotension: Secondary | ICD-10-CM | POA: Diagnosis present

## 2018-12-08 DIAGNOSIS — K920 Hematemesis: Secondary | ICD-10-CM | POA: Diagnosis present

## 2018-12-08 DIAGNOSIS — Z4659 Encounter for fitting and adjustment of other gastrointestinal appliance and device: Secondary | ICD-10-CM

## 2018-12-08 DIAGNOSIS — Z818 Family history of other mental and behavioral disorders: Secondary | ICD-10-CM

## 2018-12-08 DIAGNOSIS — E039 Hypothyroidism, unspecified: Secondary | ICD-10-CM | POA: Diagnosis present

## 2018-12-08 DIAGNOSIS — J690 Pneumonitis due to inhalation of food and vomit: Secondary | ICD-10-CM | POA: Diagnosis present

## 2018-12-08 DIAGNOSIS — E512 Wernicke's encephalopathy: Secondary | ICD-10-CM | POA: Diagnosis present

## 2018-12-08 DIAGNOSIS — S0990XS Unspecified injury of head, sequela: Secondary | ICD-10-CM

## 2018-12-08 DIAGNOSIS — E43 Unspecified severe protein-calorie malnutrition: Secondary | ICD-10-CM | POA: Diagnosis not present

## 2018-12-08 DIAGNOSIS — R29898 Other symptoms and signs involving the musculoskeletal system: Secondary | ICD-10-CM

## 2018-12-08 DIAGNOSIS — I10 Essential (primary) hypertension: Secondary | ICD-10-CM | POA: Diagnosis present

## 2018-12-08 DIAGNOSIS — M6284 Sarcopenia: Secondary | ICD-10-CM | POA: Diagnosis present

## 2018-12-08 DIAGNOSIS — Z820 Family history of epilepsy and other diseases of the nervous system: Secondary | ICD-10-CM

## 2018-12-08 DIAGNOSIS — R509 Fever, unspecified: Secondary | ICD-10-CM | POA: Diagnosis not present

## 2018-12-08 DIAGNOSIS — G40919 Epilepsy, unspecified, intractable, without status epilepticus: Secondary | ICD-10-CM | POA: Diagnosis present

## 2018-12-08 DIAGNOSIS — R7303 Prediabetes: Secondary | ICD-10-CM | POA: Diagnosis present

## 2018-12-08 DIAGNOSIS — K2100 Gastro-esophageal reflux disease with esophagitis, without bleeding: Secondary | ICD-10-CM | POA: Diagnosis present

## 2018-12-08 DIAGNOSIS — Z931 Gastrostomy status: Secondary | ICD-10-CM

## 2018-12-08 DIAGNOSIS — K5900 Constipation, unspecified: Secondary | ICD-10-CM | POA: Diagnosis present

## 2018-12-08 DIAGNOSIS — J9600 Acute respiratory failure, unspecified whether with hypoxia or hypercapnia: Secondary | ICD-10-CM

## 2018-12-08 DIAGNOSIS — R339 Retention of urine, unspecified: Secondary | ICD-10-CM | POA: Diagnosis not present

## 2018-12-08 DIAGNOSIS — D649 Anemia, unspecified: Secondary | ICD-10-CM | POA: Diagnosis not present

## 2018-12-08 DIAGNOSIS — X58XXXS Exposure to other specified factors, sequela: Secondary | ICD-10-CM | POA: Diagnosis present

## 2018-12-08 DIAGNOSIS — Z789 Other specified health status: Secondary | ICD-10-CM

## 2018-12-08 DIAGNOSIS — E860 Dehydration: Secondary | ICD-10-CM | POA: Diagnosis present

## 2018-12-08 DIAGNOSIS — G40909 Epilepsy, unspecified, not intractable, without status epilepticus: Secondary | ICD-10-CM

## 2018-12-08 DIAGNOSIS — E870 Hyperosmolality and hypernatremia: Secondary | ICD-10-CM | POA: Diagnosis not present

## 2018-12-08 DIAGNOSIS — Z7189 Other specified counseling: Secondary | ICD-10-CM

## 2018-12-08 DIAGNOSIS — G610 Guillain-Barre syndrome: Secondary | ICD-10-CM | POA: Diagnosis present

## 2018-12-08 DIAGNOSIS — Z515 Encounter for palliative care: Secondary | ICD-10-CM | POA: Diagnosis not present

## 2018-12-08 DIAGNOSIS — Z452 Encounter for adjustment and management of vascular access device: Secondary | ICD-10-CM

## 2018-12-08 DIAGNOSIS — R600 Localized edema: Secondary | ICD-10-CM | POA: Diagnosis not present

## 2018-12-08 DIAGNOSIS — L8990 Pressure ulcer of unspecified site, unspecified stage: Secondary | ICD-10-CM | POA: Insufficient documentation

## 2018-12-08 DIAGNOSIS — E538 Deficiency of other specified B group vitamins: Secondary | ICD-10-CM | POA: Diagnosis present

## 2018-12-08 DIAGNOSIS — J189 Pneumonia, unspecified organism: Secondary | ICD-10-CM

## 2018-12-08 DIAGNOSIS — L89316 Pressure-induced deep tissue damage of right buttock: Secondary | ICD-10-CM | POA: Diagnosis present

## 2018-12-08 DIAGNOSIS — Z8249 Family history of ischemic heart disease and other diseases of the circulatory system: Secondary | ICD-10-CM

## 2018-12-08 DIAGNOSIS — R197 Diarrhea, unspecified: Secondary | ICD-10-CM

## 2018-12-08 DIAGNOSIS — I959 Hypotension, unspecified: Secondary | ICD-10-CM

## 2018-12-08 DIAGNOSIS — Z6841 Body Mass Index (BMI) 40.0 and over, adult: Secondary | ICD-10-CM | POA: Diagnosis not present

## 2018-12-08 DIAGNOSIS — R451 Restlessness and agitation: Secondary | ICD-10-CM | POA: Diagnosis not present

## 2018-12-08 DIAGNOSIS — R112 Nausea with vomiting, unspecified: Secondary | ICD-10-CM | POA: Diagnosis present

## 2018-12-08 DIAGNOSIS — Z7401 Bed confinement status: Secondary | ICD-10-CM

## 2018-12-08 DIAGNOSIS — R0902 Hypoxemia: Secondary | ICD-10-CM

## 2018-12-08 DIAGNOSIS — R270 Ataxia, unspecified: Secondary | ICD-10-CM | POA: Diagnosis present

## 2018-12-08 DIAGNOSIS — R627 Adult failure to thrive: Secondary | ICD-10-CM | POA: Diagnosis not present

## 2018-12-08 DIAGNOSIS — R1110 Vomiting, unspecified: Secondary | ICD-10-CM

## 2018-12-08 DIAGNOSIS — F952 Tourette's disorder: Secondary | ICD-10-CM | POA: Diagnosis present

## 2018-12-08 DIAGNOSIS — H5509 Other forms of nystagmus: Secondary | ICD-10-CM | POA: Diagnosis present

## 2018-12-08 DIAGNOSIS — G4733 Obstructive sleep apnea (adult) (pediatric): Secondary | ICD-10-CM | POA: Diagnosis present

## 2018-12-08 DIAGNOSIS — R0682 Tachypnea, not elsewhere classified: Secondary | ICD-10-CM

## 2018-12-08 DIAGNOSIS — K861 Other chronic pancreatitis: Secondary | ICD-10-CM | POA: Diagnosis present

## 2018-12-08 HISTORY — DX: Pneumonia, unspecified organism: J18.9

## 2018-12-08 LAB — URINALYSIS, ROUTINE W REFLEX MICROSCOPIC
Bacteria, UA: NONE SEEN
Glucose, UA: NEGATIVE mg/dL
Hgb urine dipstick: NEGATIVE
Ketones, ur: NEGATIVE mg/dL
Leukocytes,Ua: NEGATIVE
Nitrite: NEGATIVE
Protein, ur: 30 mg/dL — AB
Specific Gravity, Urine: 1.025 (ref 1.005–1.030)
pH: 5 (ref 5.0–8.0)

## 2018-12-08 LAB — CBC WITH DIFFERENTIAL/PLATELET
Abs Immature Granulocytes: 0.06 10*3/uL (ref 0.00–0.07)
Basophils Absolute: 0 10*3/uL (ref 0.0–0.1)
Basophils Relative: 0 %
Eosinophils Absolute: 0 10*3/uL (ref 0.0–0.5)
Eosinophils Relative: 0 %
HCT: 42.3 % (ref 39.0–52.0)
Hemoglobin: 14 g/dL (ref 13.0–17.0)
Immature Granulocytes: 1 %
Lymphocytes Relative: 23 %
Lymphs Abs: 2.1 10*3/uL (ref 0.7–4.0)
MCH: 27.2 pg (ref 26.0–34.0)
MCHC: 33.1 g/dL (ref 30.0–36.0)
MCV: 82.1 fL (ref 80.0–100.0)
Monocytes Absolute: 0.5 10*3/uL (ref 0.1–1.0)
Monocytes Relative: 6 %
Neutro Abs: 6.4 10*3/uL (ref 1.7–7.7)
Neutrophils Relative %: 70 %
Platelets: 603 10*3/uL — ABNORMAL HIGH (ref 150–400)
RBC: 5.15 MIL/uL (ref 4.22–5.81)
RDW: 12.7 % (ref 11.5–15.5)
WBC: 9.1 10*3/uL (ref 4.0–10.5)
nRBC: 0.3 % — ABNORMAL HIGH (ref 0.0–0.2)

## 2018-12-08 LAB — PROTIME-INR
INR: 2.9 — ABNORMAL HIGH (ref 0.8–1.2)
Prothrombin Time: 29.7 seconds — ABNORMAL HIGH (ref 11.4–15.2)

## 2018-12-08 LAB — COMPREHENSIVE METABOLIC PANEL
ALT: 159 U/L — ABNORMAL HIGH (ref 0–44)
AST: 93 U/L — ABNORMAL HIGH (ref 15–41)
Albumin: 3.5 g/dL (ref 3.5–5.0)
Alkaline Phosphatase: 49 U/L (ref 38–126)
Anion gap: 21 — ABNORMAL HIGH (ref 5–15)
BUN: 19 mg/dL (ref 6–20)
CO2: 23 mmol/L (ref 22–32)
Calcium: 9.5 mg/dL (ref 8.9–10.3)
Chloride: 88 mmol/L — ABNORMAL LOW (ref 98–111)
Creatinine, Ser: 1.18 mg/dL (ref 0.61–1.24)
GFR calc Af Amer: >60 mL/min (ref >60)
GFR calc non Af Amer: >60 mL/min (ref >60)
Glucose, Bld: 137 mg/dL — ABNORMAL HIGH (ref 70–99)
Potassium: 3.1 mmol/L — ABNORMAL LOW (ref 3.5–5.1)
Sodium: 132 mmol/L — ABNORMAL LOW (ref 135–145)
Total Bilirubin: 1.1 mg/dL (ref 0.3–1.2)
Total Protein: 6.5 g/dL (ref 6.5–8.1)

## 2018-12-08 LAB — APTT: aPTT: 39 seconds — ABNORMAL HIGH (ref 24–36)

## 2018-12-08 LAB — LACTIC ACID, PLASMA
Lactic Acid, Venous: 5.4 mmol/L (ref 0.5–1.9)
Lactic Acid, Venous: 4.1 mmol/L (ref 0.5–1.9)

## 2018-12-08 LAB — SARS CORONAVIRUS 2 (TAT 6-24 HRS): SARS Coronavirus 2: NEGATIVE

## 2018-12-08 IMAGING — CT CT HEAD W/O CM
4 series · 17 of 47 positions shown, 19 images · non-contrast
Comparison: [DATE]

CLINICAL DATA: Altered level of consciousness

EXAM:
CT HEAD WITHOUT CONTRAST
TECHNIQUE: Contiguous axial images were obtained from the base of the skull
through the vertex without intravenous contrast.

[Series 3: head without · axial · non-contrast · 0.49mm/px · z∈[-186,-36]mm · 7 of 40 slices shown, 9 images]
[im 5/40  brain]
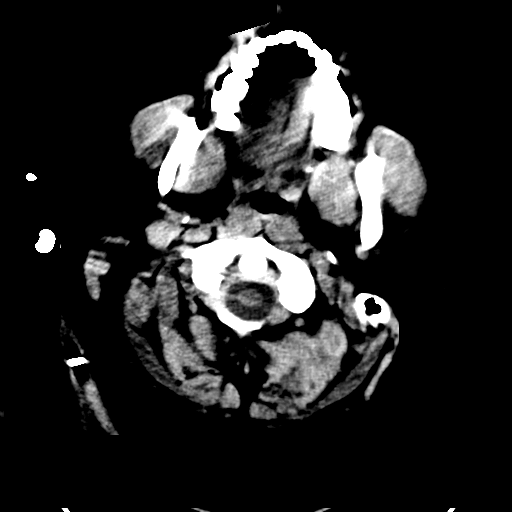
[im 5/40  bone]
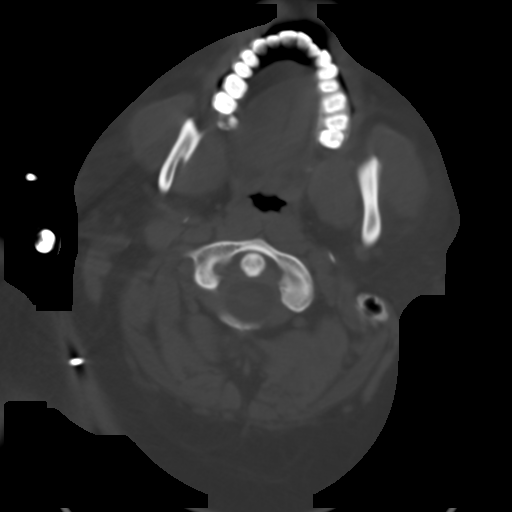
[im 10/40  brain]
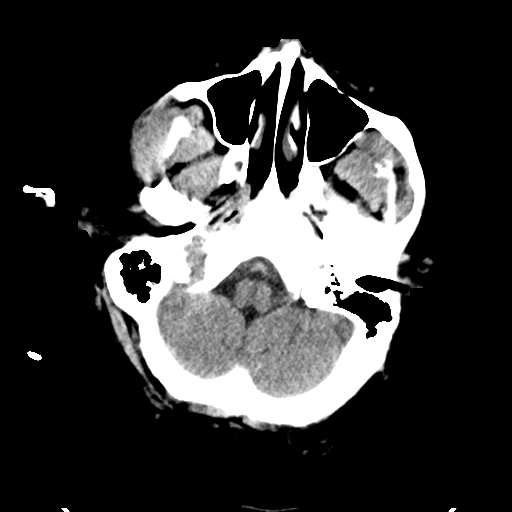
[im 15/40  brain]
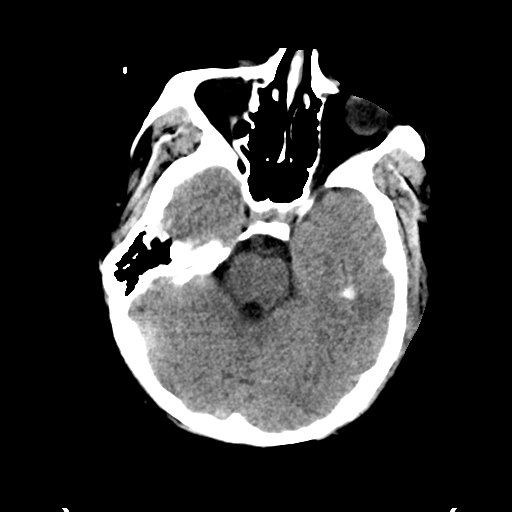
[im 20/40  brain]
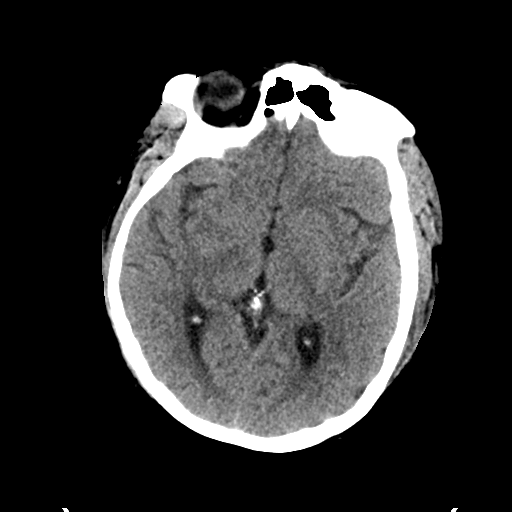
[im 25/40  brain]
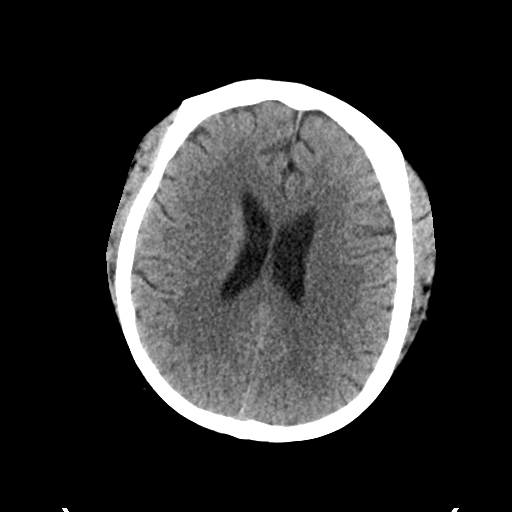
[im 25/40  bone]
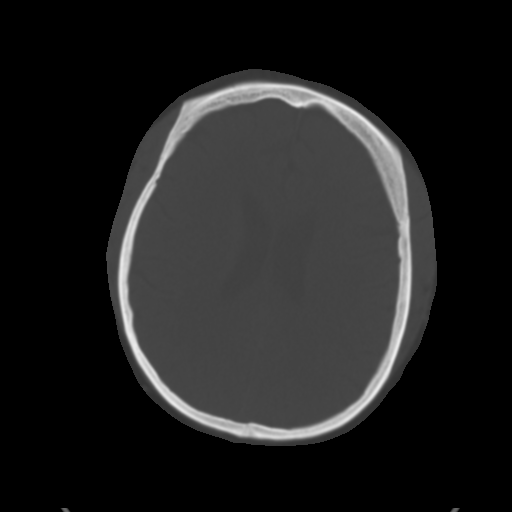
[im 30/40  brain]
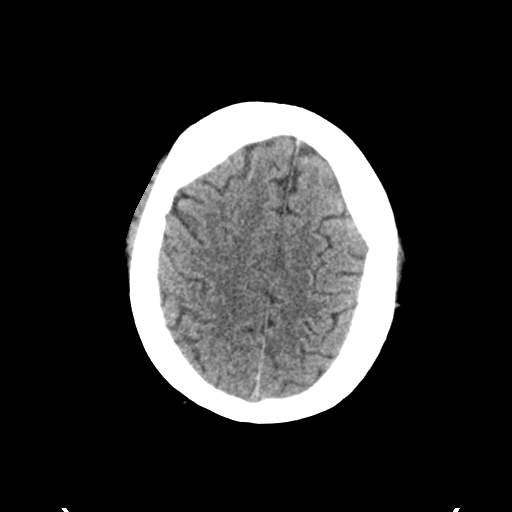
[im 35/40  brain]
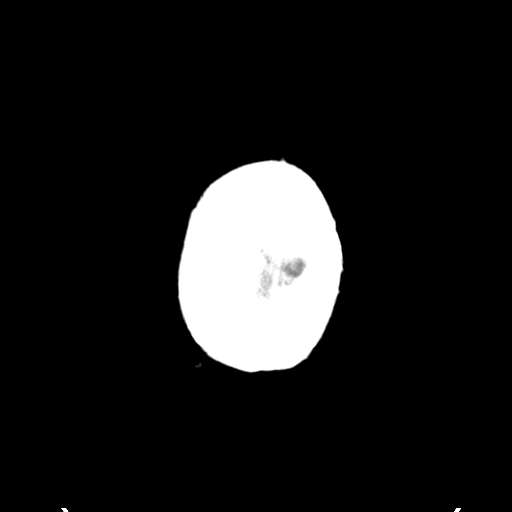

[Series 4: head bone · axial · 0.49mm/px · z∈[-188,-118]mm · 4 of 100 slices shown]
[im 10/100  bone]
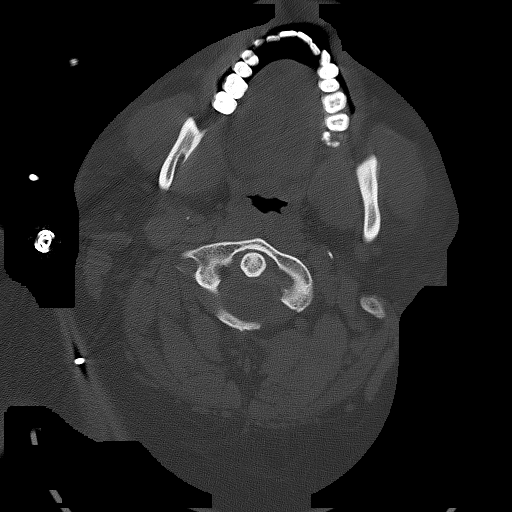
[im 20/100  bone]
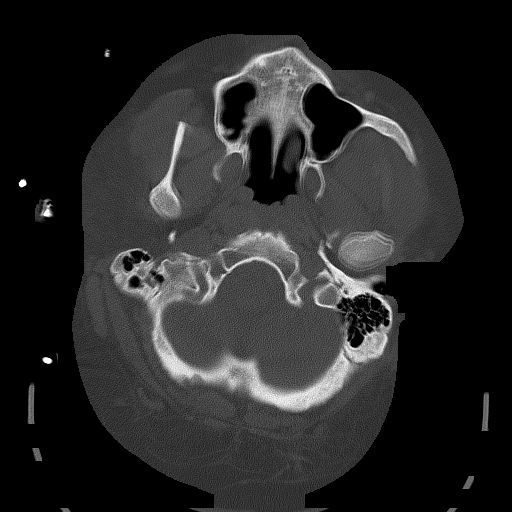
[im 30/100  bone]
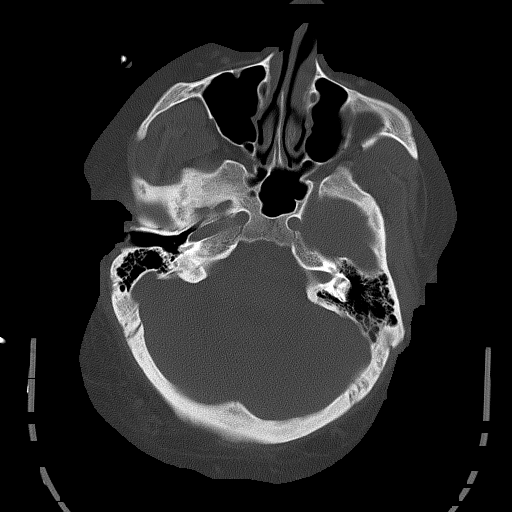
[im 45/100  bone]
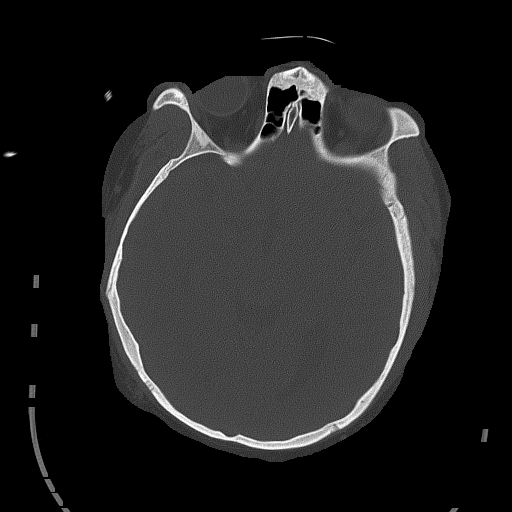

[Series 5: head without cor · coronal · non-contrast · 0.39mm/px · 3 of 75 slices shown]
[im 25/75  brain]
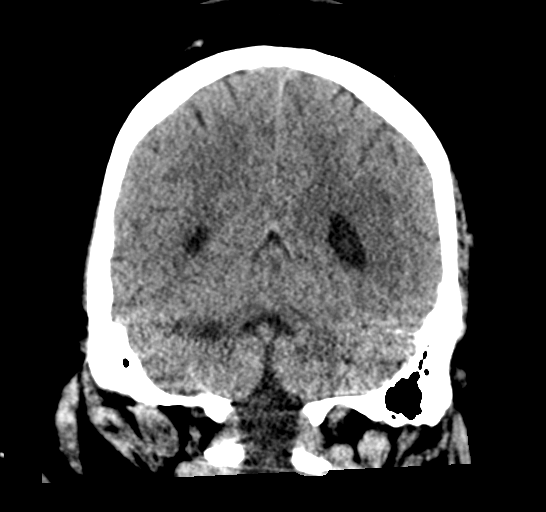
[im 33/75  brain]
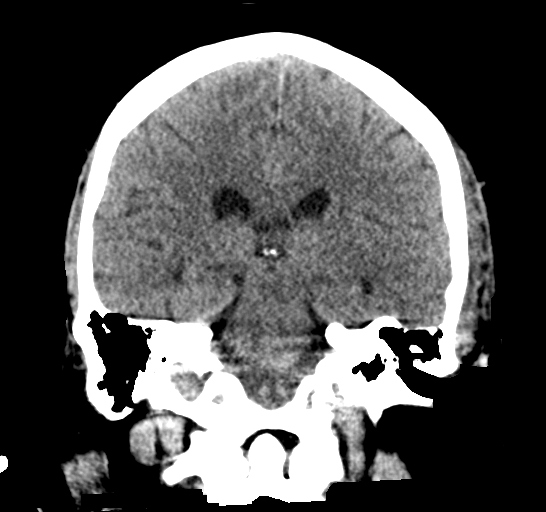
[im 42/75  brain]
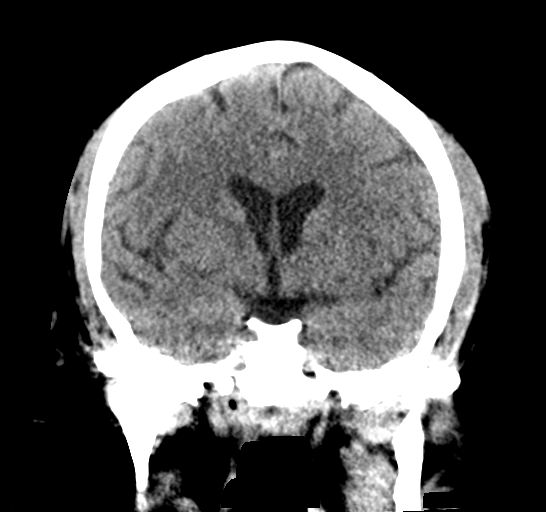

[Series 6: head without sag · sagittal · non-contrast · 0.39mm/px · 3 of 67 slices shown]
[im 23/67  brain]
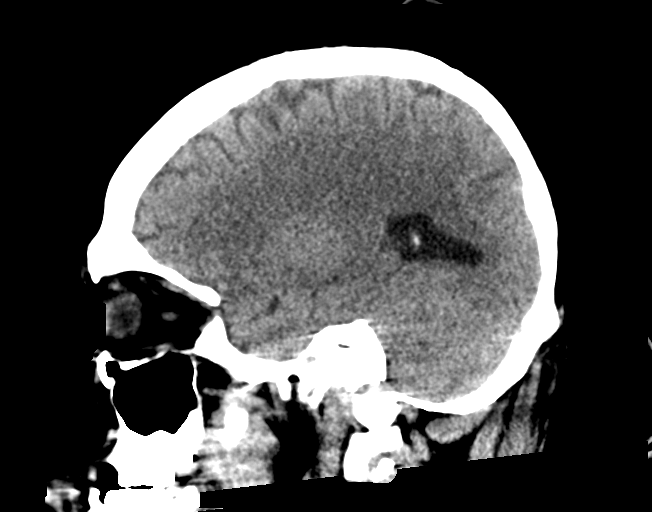
[im 34/67  brain]
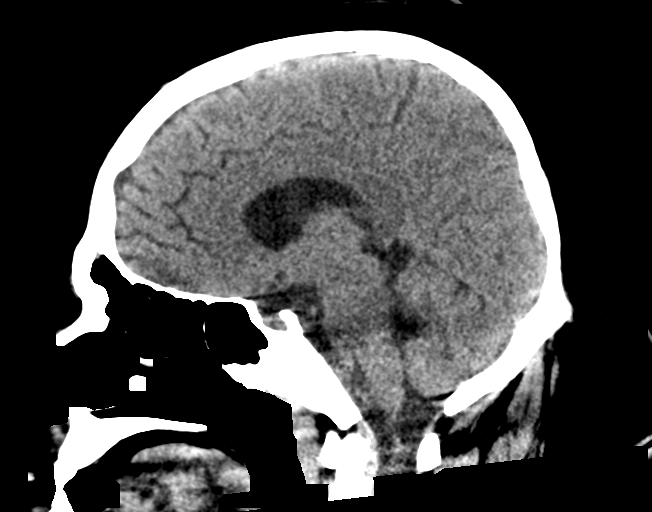
[im 45/67  brain]
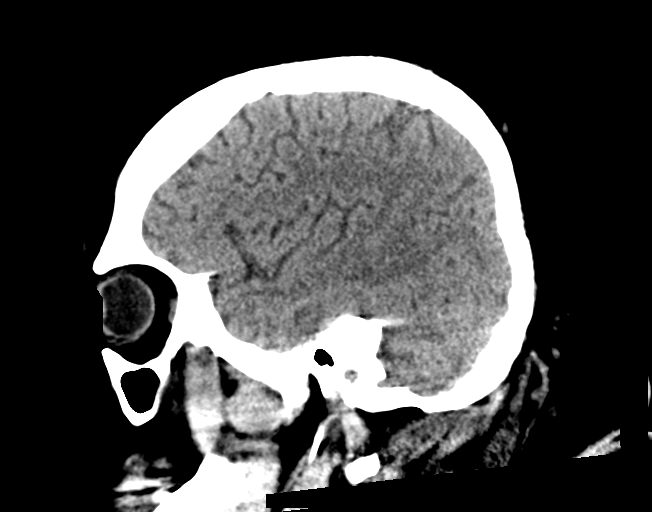

[17 of 47 positions shown; findings below may reference images not displayed]

FINDINGS: Brain: The brainstem, cerebellum, cerebral peduncles, thalami, basal
ganglia, basilar cisterns, and ventricular system appear within
normal limits. No intracranial hemorrhage, mass lesion, or acute
CVA.

Vascular: Unremarkable

Skull: Unremarkable

Sinuses/Orbits: Mild chronic right frontal sinusitis.

Other: No supplemental non-categorized findings.
IMPRESSION: 1. No significant intracranial abnormality is observed.
2. Mild chronic right frontal sinusitis.

## 2018-12-08 IMAGING — DX DG CHEST 1V PORT
1 series · 1 of 1 positions shown · non-contrast
Comparison: [DATE]

CLINICAL DATA: Sepsis.

EXAM:
PORTABLE CHEST 1 VIEW

[chest ap]
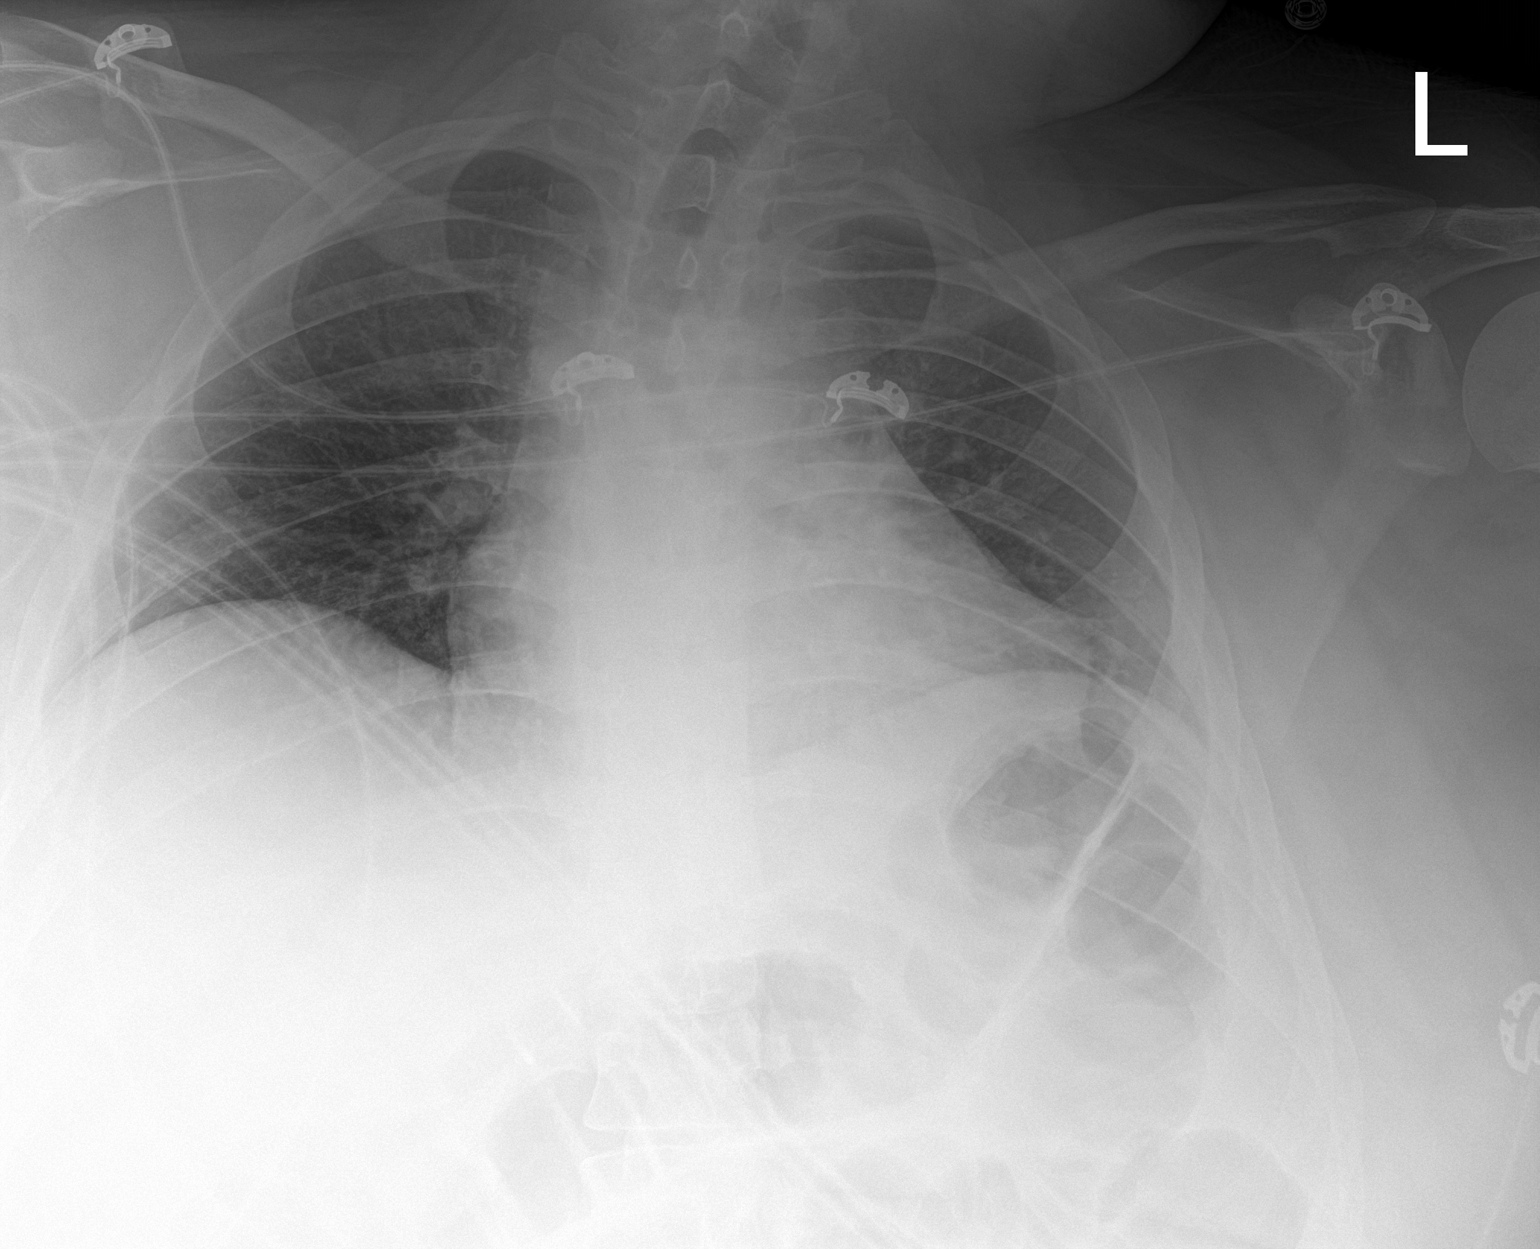

[1 of 1 positions shown; findings below may reference images not displayed]

FINDINGS: A very poor inspiration is again demonstrated. The heart remains
grossly normal in size. Interval minimal patchy and linear density
in the left lower lobe. Clear right lung. Mild peribronchial
thickening. Unremarkable bones.
IMPRESSION: 1. Interval minimal patchy and linear atelectasis or pneumonia in
the left lower lobe.
2. Mild bronchitic changes.
3. Stable very poor inspiration.

## 2018-12-08 IMAGING — CT CT ABD-PELV W/ CM
2 of 5 series · 15 of 46 positions shown, 17 images · IV contrast (Omni 300)
Comparison: [DATE]

CLINICAL DATA: Abdominal pain. Shortness of breath.

EXAM:
CT ABDOMEN AND PELVIS WITH CONTRAST
TECHNIQUE: Multidetector CT imaging of the abdomen and pelvis was performed
using the standard protocol following bolus administration of
intravenous contrast.
CONTRAST:  120mL OMNIPAQUE IOHEXOL 350 MG/ML SOLN

[Series 3: a/p w/ 5mm · axial · 0.98mm/px · z∈[-1027,-462]mm · 12 of 125 slices shown, 14 images]
[im 6/125  soft-tissue]
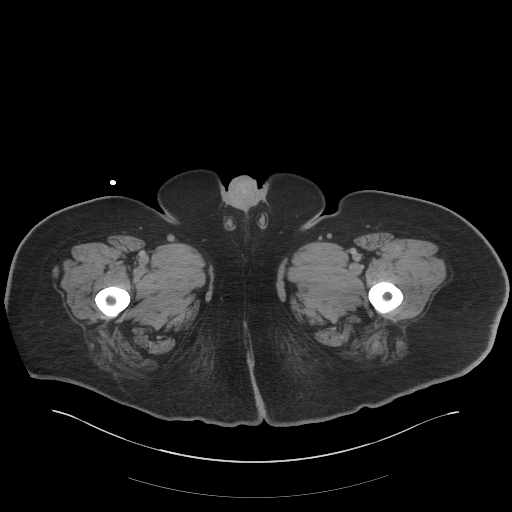
[im 6/125  bone]
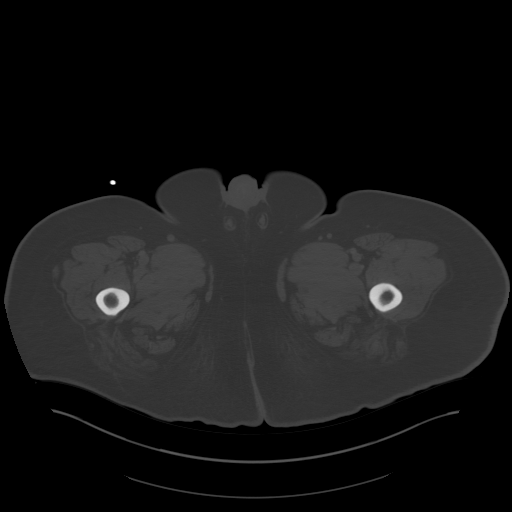
[im 18/125  soft-tissue]
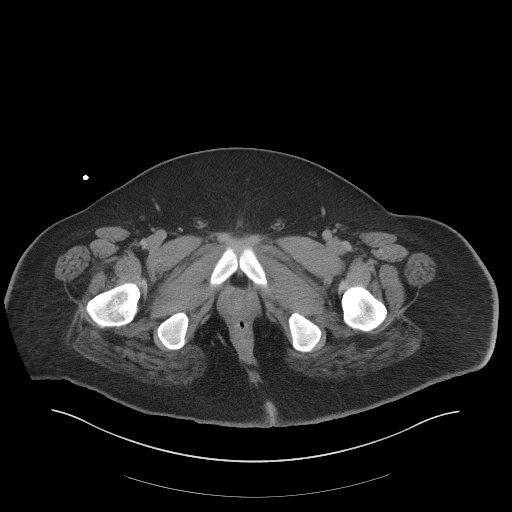
[im 30/125  soft-tissue]
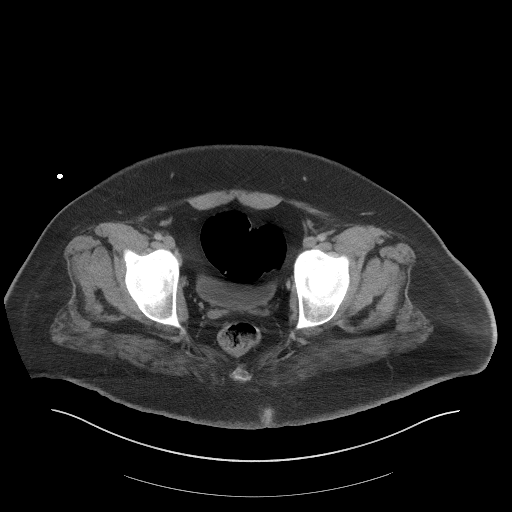
[im 36/125  soft-tissue]
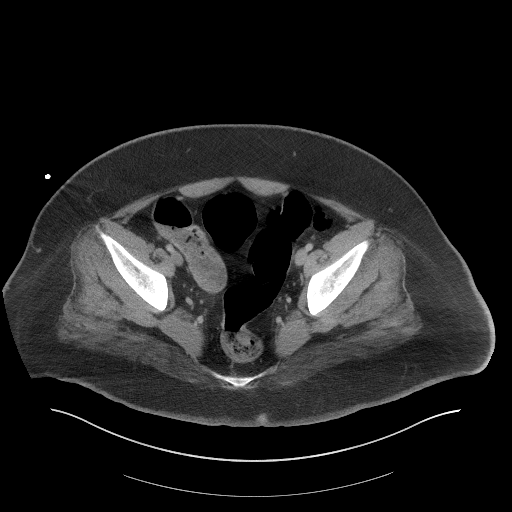
[im 48/125  soft-tissue]
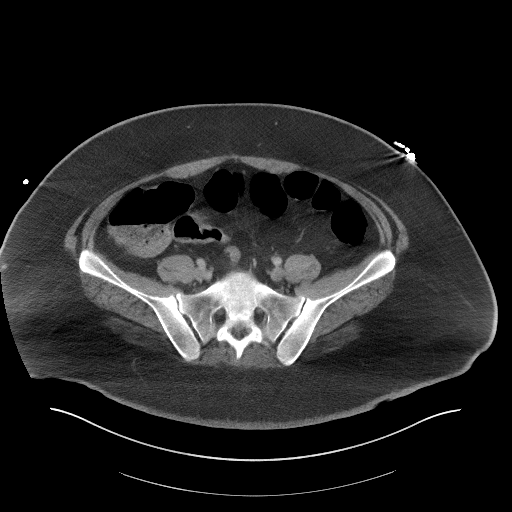
[im 60/125  soft-tissue]
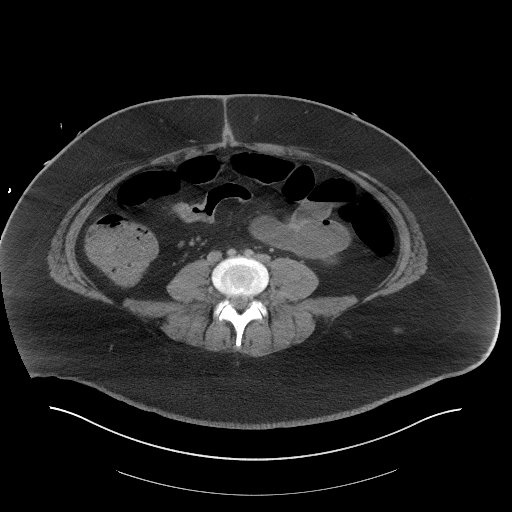
[im 65/125  soft-tissue]
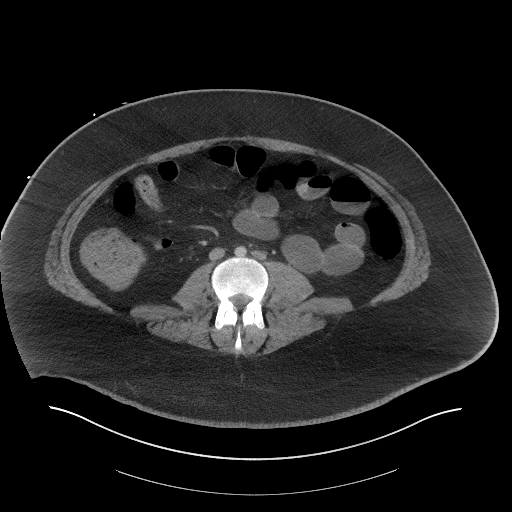
[im 77/125  soft-tissue]
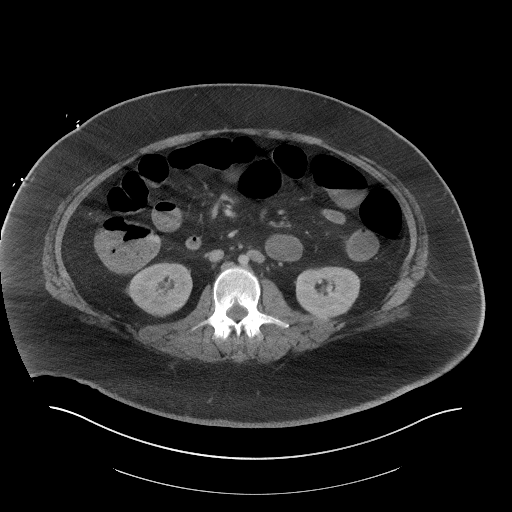
[im 89/125  soft-tissue]
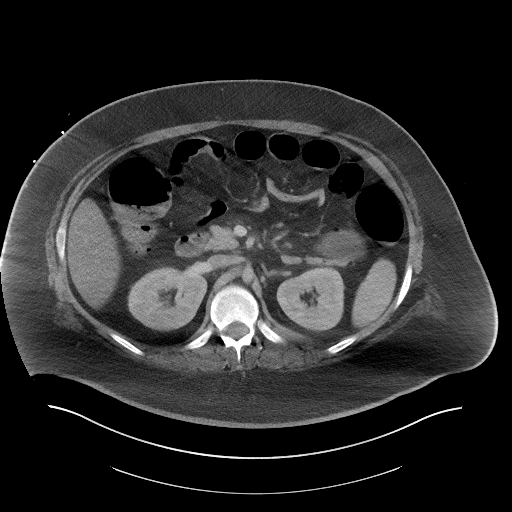
[im 89/125  bone]
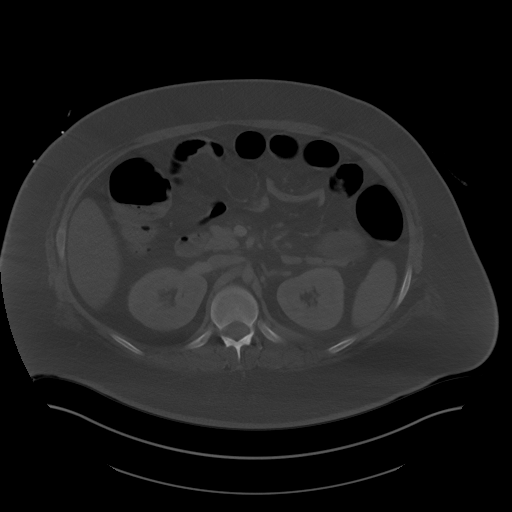
[im 95/125  soft-tissue]
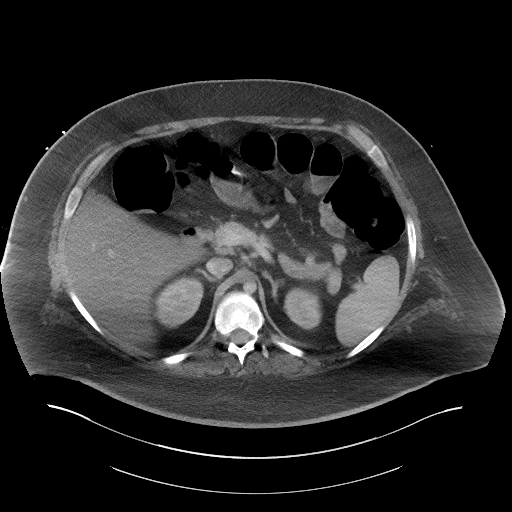
[im 107/125  soft-tissue]
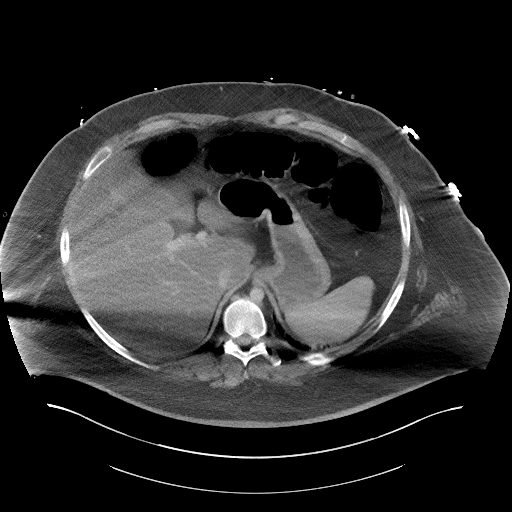
[im 119/125  soft-tissue]
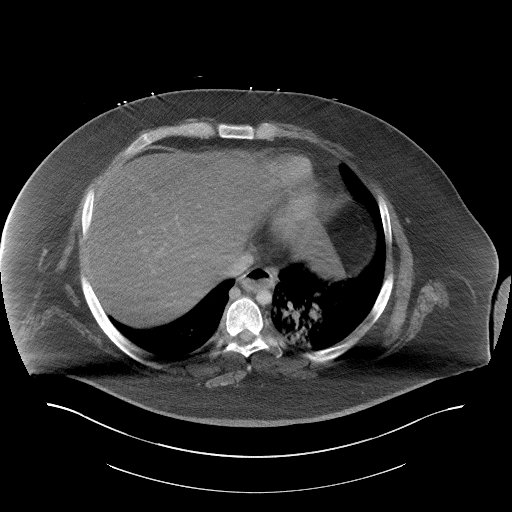

[Series 6: a/p w/ cor · coronal · 0.88mm/px · 3 of 156 slices shown]
[im 52/156  soft-tissue]
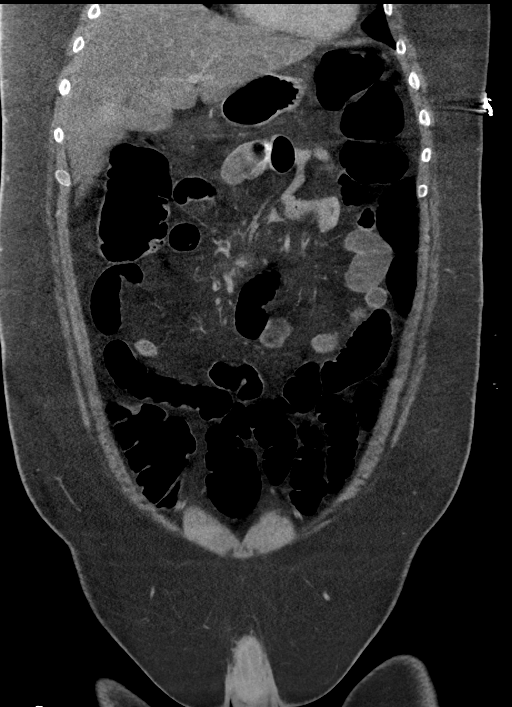
[im 69/156  soft-tissue]
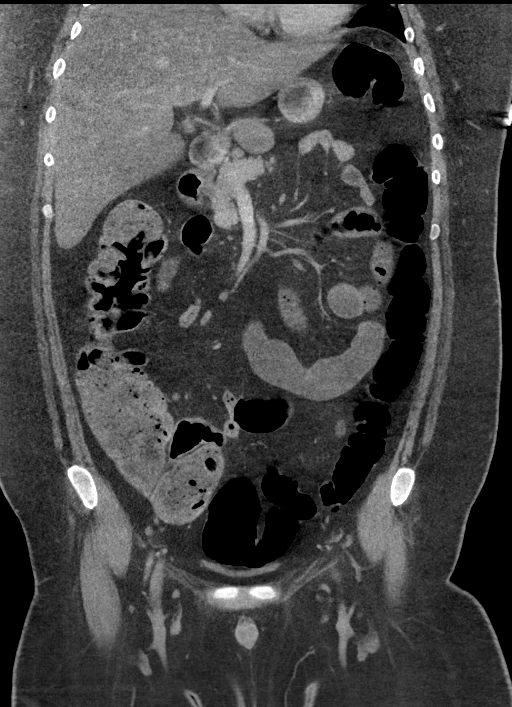
[im 87/156  soft-tissue]
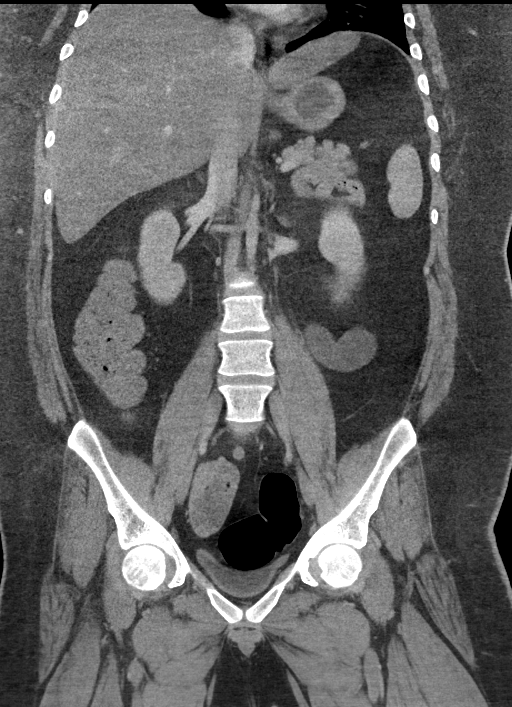

[15 of 46 positions shown; findings below may reference images not displayed]

FINDINGS: Lower chest: New airspace opacity in the left lower lobe suspicious
for pneumonia.

Air fluid level in the distal esophagus probably from reflux.

Hepatobiliary: Diffuse hepatic steatosis is suspected. Otherwise
unremarkable.

Pancreas: Unremarkable

Spleen: Unremarkable

Adrenals/Urinary Tract: Unremarkable

Stomach/Bowel: There are a few mildly dilated loops of small bowel
measuring up to 3.5 cm in diameter, with scattered air-fluid levels
but without an obvious transition point or site of obstruction.

Vascular/Lymphatic: Borderline prominent right gastric node 0.9 cm
in short axis on image [DATE], previously 0.8 cm.

Patient has an unusually small in caliber abdominal aorta, measuring
0.9 cm in diameter. Common origin of the celiac trunk and SMA. No
obvious periaortic inflammatory findings.

Reproductive: Unremarkable

Other: No supplemental non-categorized findings.

Musculoskeletal: Unremarkable
IMPRESSION: 1. New airspace opacity in the left lower lobe suspicious for
pneumonia.
2. There are a few mildly dilated loops of small bowel with
scattered air-fluid levels but without an obvious transition point
or site of obstruction. Ileus is favored over low-grade partial
small bowel obstruction.
3. Diffuse hepatic steatosis.
4. Air fluid level in the distal esophagus probably from reflux.
5. Small caliber abdominal aorta, only about 0.9 cm in diameter,
without periaortic inflammatory findings. The possibility of
COCHEV syndrome is raised. Consider follow up vascular surgical
consultation.

## 2018-12-08 IMAGING — DX DG ABD PORTABLE 1V
2 series · 2 of 2 positions shown · non-contrast
Comparison: [DATE] and abdomen and pelvis CT dated [DATE].

CLINICAL DATA: Sepsis. Fever.

EXAM:
PORTABLE ABDOMEN - 1 VIEW

[abdomen kub (1 of 2)]
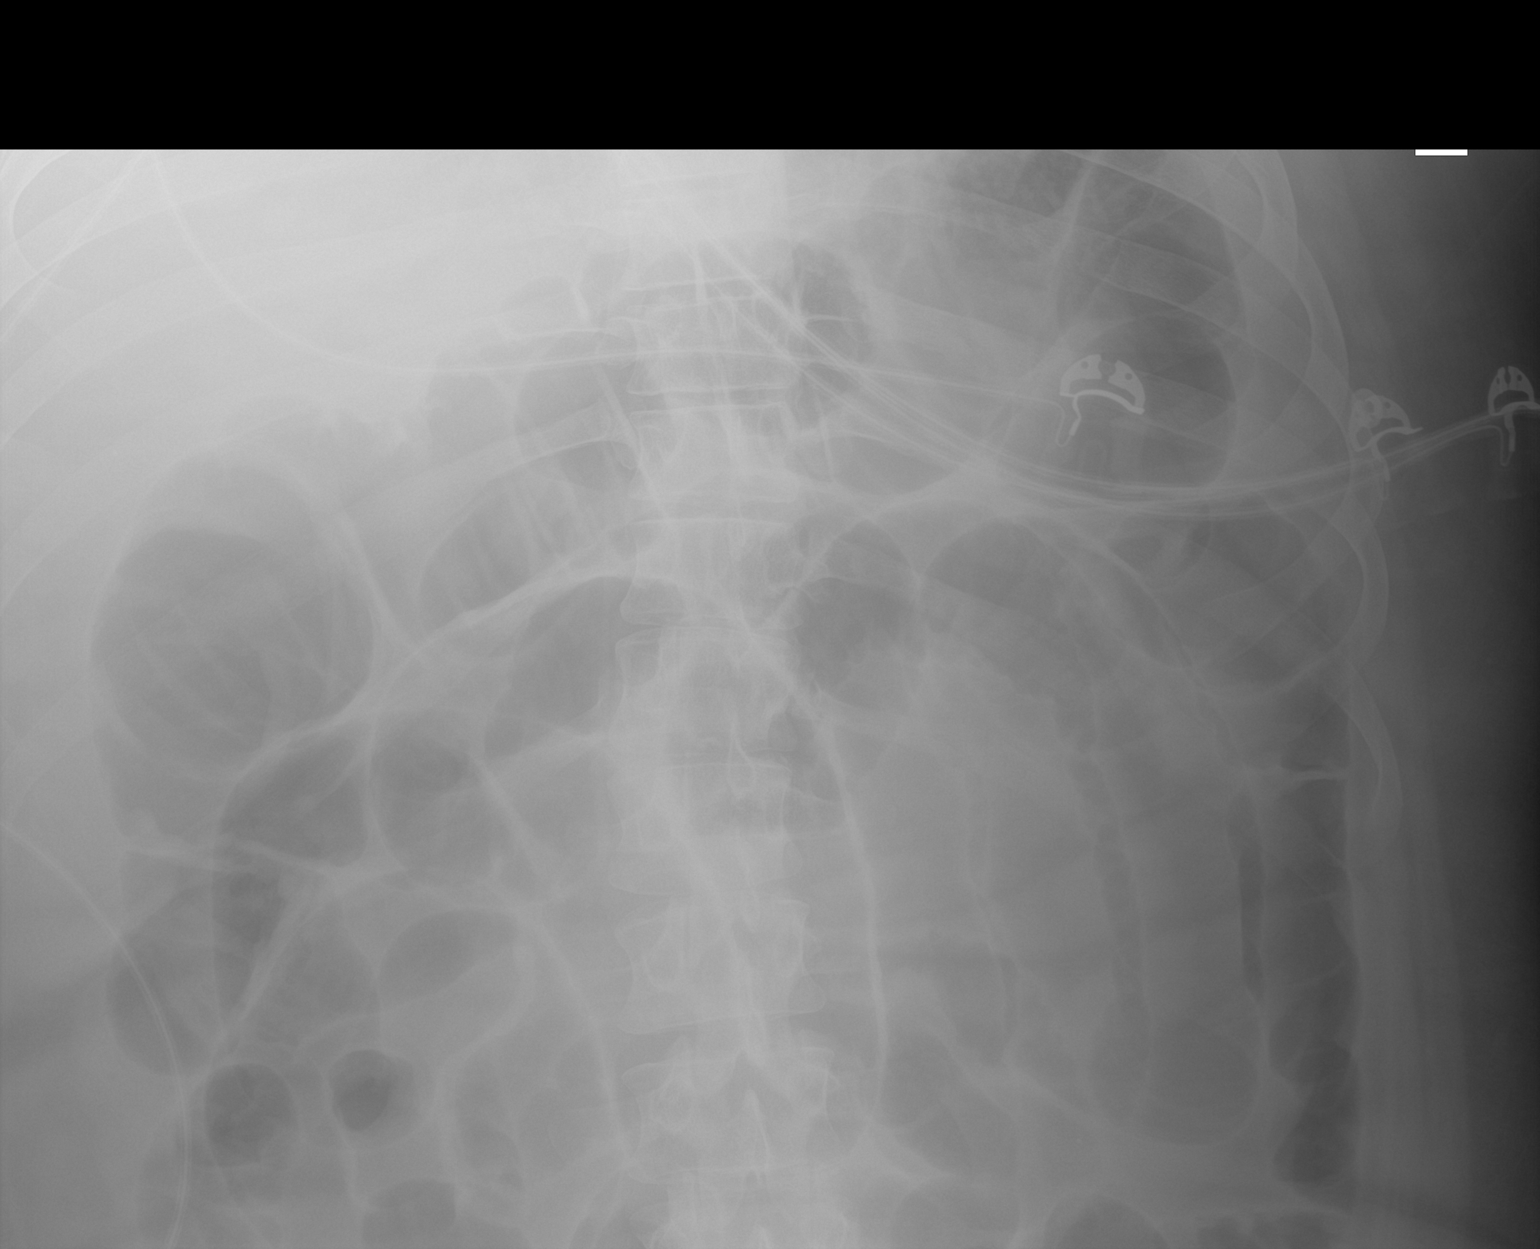

[abdomen kub (2 of 2)]
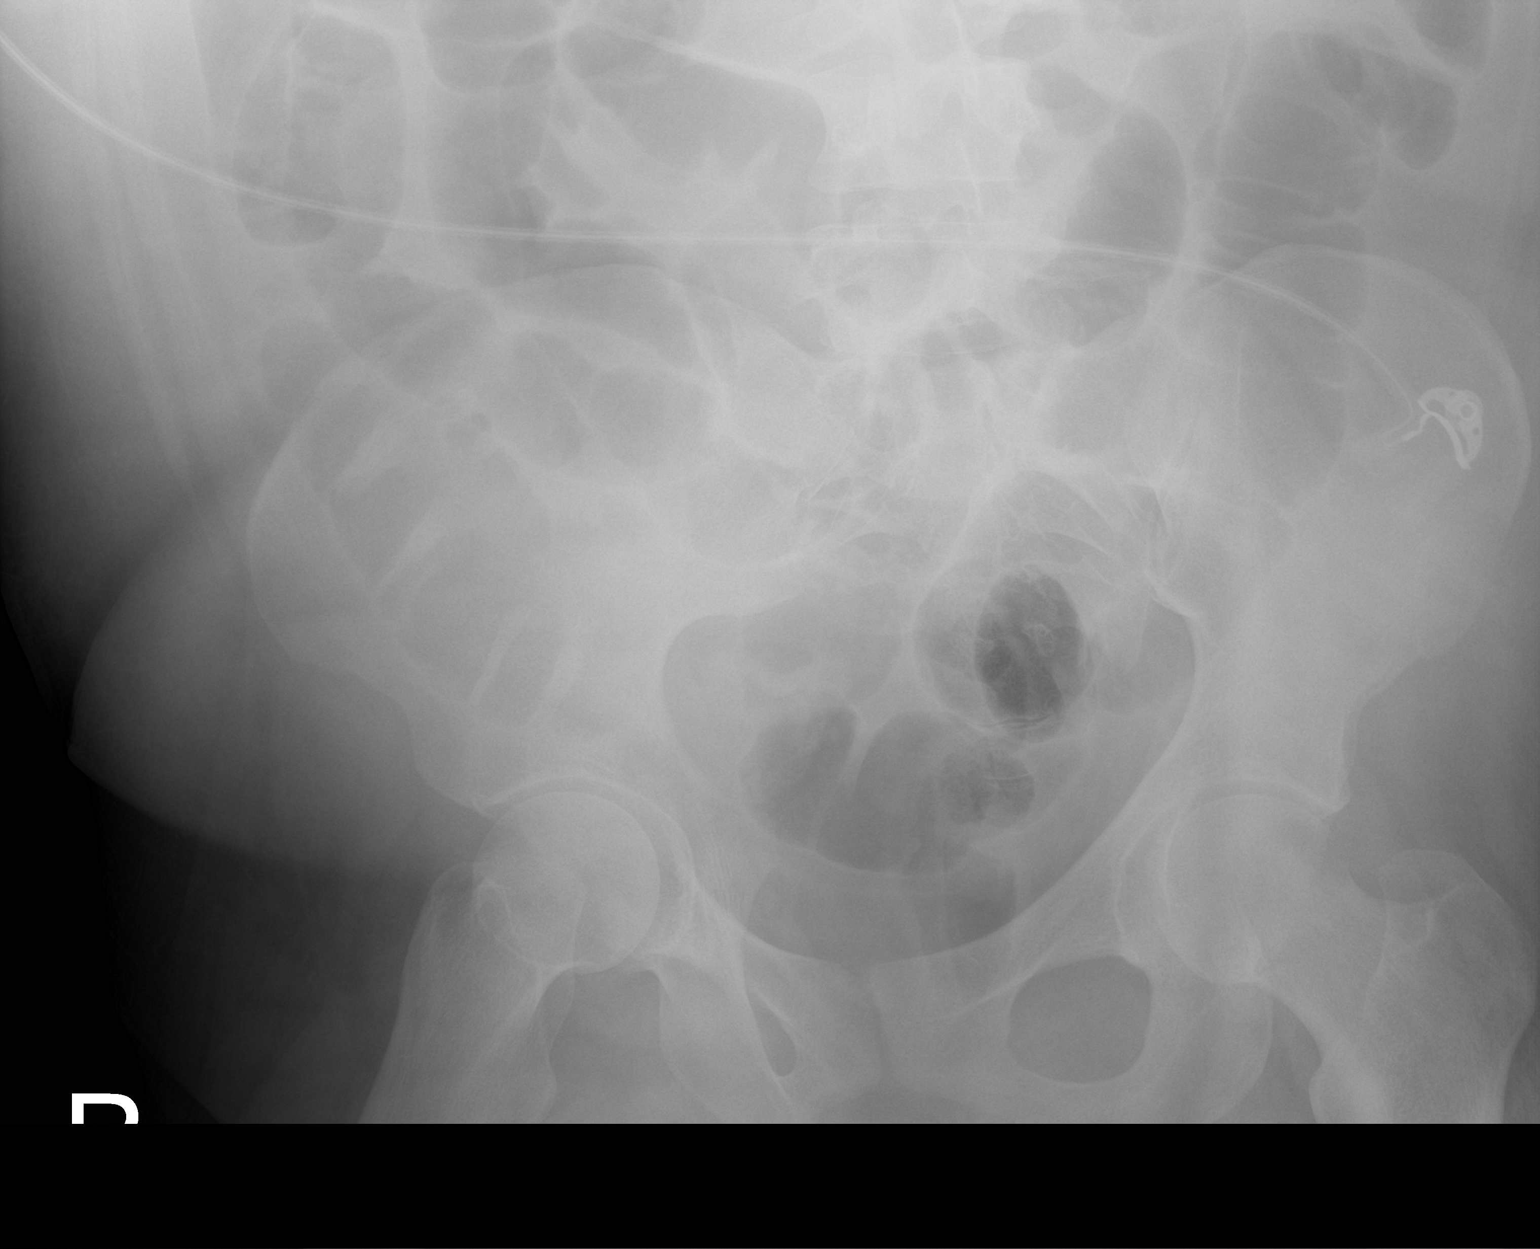

[2 of 2 positions shown; findings below may reference images not displayed]

FINDINGS: Interval multiple mildly dilated loops of proximal small bowel with
gas-filled normal caliber distal small bowel and colon. Unremarkable
bones.
IMPRESSION: Interval mild small bowel ileus or partial obstruction. No gross
free air seen at this time.

## 2018-12-08 MED ORDER — POTASSIUM CHLORIDE 10 MEQ/100ML IV SOLN
10.0000 meq | INTRAVENOUS | Status: AC
  Administered 2018-12-08 – 2018-12-09 (×3): 10 meq via INTRAVENOUS
  Filled 2018-12-08 (×3): qty 100

## 2018-12-08 MED ORDER — SODIUM CHLORIDE 0.9 % IV BOLUS
1000.0000 mL | Freq: Once | INTRAVENOUS | Status: AC
  Administered 2018-12-08: 1000 mL via INTRAVENOUS

## 2018-12-08 MED ORDER — SODIUM CHLORIDE 0.9 % IV SOLN
INTRAVENOUS | Status: DC
  Administered 2018-12-08 – 2018-12-10 (×6): via INTRAVENOUS

## 2018-12-08 MED ORDER — SODIUM CHLORIDE 0.9 % IV SOLN
2.0000 g | Freq: Three times a day (TID) | INTRAVENOUS | Status: DC
  Administered 2018-12-09 – 2018-12-11 (×9): 2 g via INTRAVENOUS
  Filled 2018-12-08 (×12): qty 2

## 2018-12-08 MED ORDER — SODIUM CHLORIDE 0.9 % IV BOLUS
1500.0000 mL | Freq: Once | INTRAVENOUS | Status: AC
  Administered 2018-12-08: 1500 mL via INTRAVENOUS

## 2018-12-08 MED ORDER — VANCOMYCIN HCL IN DEXTROSE 1-5 GM/200ML-% IV SOLN: 1000.0000 mg | Freq: Once | INTRAVENOUS | Status: DC

## 2018-12-08 MED ORDER — METRONIDAZOLE IN NACL 5-0.79 MG/ML-% IV SOLN
500.0000 mg | Freq: Three times a day (TID) | INTRAVENOUS | Status: DC
  Administered 2018-12-09 – 2018-12-11 (×9): 500 mg via INTRAVENOUS
  Filled 2018-12-08 (×9): qty 100

## 2018-12-08 MED ORDER — IOHEXOL 350 MG/ML SOLN
120.0000 mL | Freq: Once | INTRAVENOUS | Status: AC | PRN
  Administered 2018-12-08: 120 mL via INTRAVENOUS

## 2018-12-08 MED ORDER — VANCOMYCIN HCL 10 G IV SOLR
2500.0000 mg | Freq: Once | INTRAVENOUS | Status: AC
  Administered 2018-12-08: 2500 mg via INTRAVENOUS
  Filled 2018-12-08: qty 2500

## 2018-12-08 MED ORDER — SODIUM CHLORIDE 0.9 % IV SOLN
2.0000 g | Freq: Once | INTRAVENOUS | Status: AC
  Administered 2018-12-08: 2 g via INTRAVENOUS
  Filled 2018-12-08: qty 2

## 2018-12-08 MED ORDER — METRONIDAZOLE IN NACL 5-0.79 MG/ML-% IV SOLN
500.0000 mg | Freq: Once | INTRAVENOUS | Status: AC
  Administered 2018-12-08: 15:00:00 500 mg via INTRAVENOUS
  Filled 2018-12-08: qty 100

## 2018-12-08 MED ORDER — VANCOMYCIN HCL 10 G IV SOLR
1250.0000 mg | Freq: Two times a day (BID) | INTRAVENOUS | Status: DC
  Administered 2018-12-09 – 2018-12-10 (×3): 1250 mg via INTRAVENOUS
  Filled 2018-12-08 (×4): qty 1250

## 2018-12-08 NOTE — ED Notes (Signed)
Patient dad calling asking for an update Aaron Vega [phone]  Please call when we can, he is very worried for patient

## 2018-12-08 NOTE — Progress Notes (Signed)
Made RN [place] aware that last LA was 4.1 with regards to future orders for LA

## 2018-12-08 NOTE — ED Notes (Signed)
Provided EDP with status update on bp and fluids.

## 2018-12-08 NOTE — ED Notes (Signed)
Pt's mom walked outside to get air, will return after making phone calls

## 2018-12-08 NOTE — ED Provider Notes (Signed)
[facility] EMERGENCY DEPARTMENT Provider Note   CSN: [id number] Arrival date & time: 01/01/19  1351     History   Chief Complaint Chief Complaint  Patient presents with   Hypotension    HPI Aaron Vega is a 23 y.o. male.     Patient is a 23 year old male who presents with possible sepsis.  He has a history of obesity, hypertension, severe autism and seizures.  He is nonverbal at baseline.  He had a seizure with associated head injury in August and has had a progressive decline since that time.  His mom states that he prior to that was ambulatory and active.  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  She reportedly has been told that he has a poor outcome and did put the patient in hospice care starting about a week ago.  Patient currently is in hospice care.  Mom states however that she wants aggressive care and feels that patient will improve.  Patient does not have DNR orders.  He has had declining urination over the last 2 days.  He has had ongoing vomiting which is not uncommon for him because he has had problems with constipation over the last few months.  However now he is not urinating and when he does urinate it is tea colored.  He seems to have some discomfort with urination.  The hospice nurse thought that he had some crackles in his lungs and he was sent here for further evaluation.  History is limited due to his nonverbal state.     Past Medical History:  Diagnosis Date   Autistic disorder, current or active state    Fatty liver    Hypertension    Obesity    Pancreatitis    at age 89 years   Pre-diabetes    Seizures (HCC)    as of 11/23/15 - no seizures for 3 month   Sleep apnea    not on cpap (can't tolerate)   Tics of organic origin    Tourette syndrome     Patient Active Problem List   Diagnosis Date Noted   Hematemesis 11/25/2018   Constipation  11/20/2018   Abnormal LFTs 11/20/2018   Hyperglycemia 11/20/2018   Obesity, Class III, BMI 40-49.9 (morbid obesity) (HCC) 11/20/2018   Reflux esophagitis 11/08/2018   Constipation in male 11/08/2018   Vomiting in adult    Intractable nausea and vomiting 11/07/2018   Obesity (BMI 30-39.9) 11/07/2018   Seizure disorder (HCC) 11/07/2018   Dehydration    Tics of organic origin 09/12/2012   Autistic disorder 09/12/2012    Past Surgical History:  Procedure Laterality Date   BIOPSY  11/08/2018   Procedure: BIOPSY;  Surgeon: Beverley Fiedler, MD;  Location: [facility];  Service: Gastroenterology;;   ESOPHAGOGASTRODUODENOSCOPY (EGD) WITH PROPOFOL N/A 11/08/2018   Procedure: ESOPHAGOGASTRODUODENOSCOPY (EGD) WITH PROPOFOL;  Surgeon: Beverley Fiedler, MD;  Location: [facility];  Service: Gastroenterology;  Laterality: N/A;   FLEXIBLE SIGMOIDOSCOPY N/A 11/21/2018   Procedure: FLEXIBLE SIGMOIDOSCOPY;  Surgeon: Meridee Score Netty Starring., MD;  Location: [facility];  Service: Gastroenterology;  Laterality: N/A;   IMPACTION REMOVAL  11/21/2018   Procedure: IMPACTION REMOVAL;  Surgeon: Lemar Lofty., MD;  Location: [facility];  Service: Gastroenterology;;   RADIOLOGY WITH ANESTHESIA N/A 11/24/2015   Procedure: CT SCAN ABDOMEN AND PELVIS WITH CONTRAST;  Surgeon: Medication Radiologist, MD;  Location: [facility] OR;  Service: Radiology;  Laterality: N/A;   TONSILLECTOMY     and adenoidectomy        Home Medications    Prior to Admission medications   Medication Sig Start Date End Date Taking? Authorizing Provider  acetaminophen (TYLENOL) 650 MG suppository Place 650 mg rectally every 6 (six) hours as needed for mild pain or fever.  11/05/18  Yes [provider]  citalopram (CELEXA) 40 MG tablet Take 40 mg by mouth daily. 04/24/14  Yes [provider]  cloBAZam (ONFI) 20 MG tablet Take 60 mg by mouth 2 (two) times daily. 10/16/18  Yes [provider]    diazepam (DIASAT) 20 MG GEL Place 20 mg rectally daily as needed (for seizures lasting more than 5 minutes.).  07/03/13  Yes [provider]  diazepam (VALIUM) 5 MG tablet Take 7.5 mg by mouth every morning. May give 0.5 tablet (2.5mg ) as needed for agitation/anxiety 10/15/18  Yes [provider]  levothyroxine (SYNTHROID) 25 MCG tablet Take 1 tablet (25 mcg total) by mouth daily before breakfast. 11/25/18  Yes Narda BondsNettey, Ralph A, MD  lisinopril (PRINIVIL,ZESTRIL) 20 MG tablet Take 20 mg by mouth daily. Take with 5mg  tablet for a total dose of 25mg  04/15/14  Yes [provider]  lisinopril (ZESTRIL) 5 MG tablet Take 5 mg by mouth daily. Take with 20mg  tablet for a total dose of 25mg  10/14/18  Yes [provider]  Melatonin 5 MG TABS Take 5 mg by mouth at bedtime.   Yes [provider]  Midazolam (NAYZILAM) 5 MG/0.1ML SOLN Place 5 mg into the nose daily as needed (seizure).   Yes [provider]  nystatin (MYCOSTATIN/NYSTOP) 100000 UNIT/GM POWD Apply 1 application topically daily. Neck, crevices, etc. 04/23/14  Yes [provider]  omeprazole (PRILOSEC) 40 MG capsule Take 1 capsule (40 mg total) by mouth 2 (two) times daily. Open capsule and sprinkle into applesauce or yoghurt. 11/27/18  Yes Narda BondsNettey, Ralph A, MD  ondansetron (ZOFRAN ODT) 4 MG disintegrating tablet Take 1 tablet (4 mg total) by mouth every 8 (eight) hours as needed for nausea or vomiting. 11/25/18  Yes Narda BondsNettey, Ralph A, MD  polyethylene glycol (MIRALAX) 17 g packet Take 17 g by mouth 2 (two) times daily. Patient taking differently: Take 17 g by mouth daily as needed for mild constipation.  11/25/18  Yes Narda BondsNettey, Ralph A, MD  promethazine (PHENERGAN) 25 MG suppository Place 1 suppository (25 mg total) rectally every 6 (six) hours as needed for nausea or vomiting. 11/12/18  Yes Sharen HeckGibbons, Claudia J, PA-C  sucralfate (CARAFATE) 1 GM/10ML suspension Take 10 mLs (1 g total) by mouth 4 (four)  times daily -  with meals and at bedtime. 11/27/18  Yes Narda BondsNettey, Ralph A, MD  linaclotide Karlene Einstein(LINZESS) 290 MCG CAPS capsule Take 1 capsule (290 mcg total) by mouth daily before breakfast. Patient not taking: Reported on 11/25/2018 11/26/18   Narda BondsNettey, Ralph A, MD  MILK OF MAGNESIA 400 MG/5ML suspension Take 30-60 mLs by mouth daily as needed for constipation. 12/06/18   [provider]  zonisamide (ZONEGRAN) 100 MG capsule Take 100-200 mg by mouth See admin instructions. Take one capsule every other night for two weeks, then take on capsule every night for two weeks, then take 200mg  every night. 11/27/18   [provider]    Family History Family History  Problem Relation Age of Onset   Seizures Maternal Aunt    Anxiety disorder Paternal Uncle    Depression Paternal Uncle  Bipolar disorder Paternal Uncle    Seizures Cousin        Maternal 1st and 2nd cousins have seizures   Anxiety disorder Other        Paternal fhx   Depression Other        Paternal fhx   Hypertension Other        Paternal fhx   Bipolar disorder Other        Paternal fhx    Social History Social History   Tobacco Use   Smoking status: Never Smoker   Smokeless tobacco: Never Used  Substance Use Topics   Alcohol use: Never    Frequency: Never   Drug use: Never     Allergies   Patient has no known allergies.   Review of Systems Review of Systems  Unable to perform ROS: Patient nonverbal     Physical Exam Updated Vital Signs BP (!) 97/58 (BP Location: Left Arm)    Pulse (!) 123    Temp (!) 101.2 F (38.4 C) (Rectal)    Resp (!) 38    Ht 5\' 11"  (1.803 m)    Wt 135.2 kg Comment: stated by mother   SpO2 97%    BMI 41.56 kg/m   Physical Exam Constitutional:      Comments: Patient is markedly obese.  He is awake with eyes open but is nonverbal.  He will tracking with his eyes.  HENT:     Head: Normocephalic and atraumatic.     Mouth/Throat:     Mouth: Mucous membranes are  moist.  Eyes:     Conjunctiva/sclera: Conjunctivae normal.     Pupils: Pupils are equal, round, and reactive to light.  Neck:     Musculoskeletal: Normal range of motion and neck supple.  Cardiovascular:     Rate and Rhythm: Tachycardia present.     Pulses: Normal pulses.     Heart sounds: Normal heart sounds.  Pulmonary:     Effort: Pulmonary effort is normal.     Breath sounds: Rales present.  Abdominal:     General: Bowel sounds are normal. There is no distension.     Palpations: Abdomen is soft.     Tenderness: There is no abdominal tenderness.  Genitourinary:    Penis: Normal.   Musculoskeletal:        General: No swelling or deformity.  Skin:    General: Skin is warm and dry.  Neurological:     Mental Status: Mental status is at baseline.      ED Treatments / Results  Labs (all labs ordered are listed, but only abnormal results are displayed) Labs Reviewed  LACTIC ACID, PLASMA - Abnormal; Notable for the following components:      Result Value   Lactic Acid, Venous 5.4 (*)    All other components within normal limits  LACTIC ACID, PLASMA - Abnormal; Notable for the following components:   Lactic Acid, Venous 4.1 (*)    All other components within normal limits  COMPREHENSIVE METABOLIC PANEL - Abnormal; Notable for the following components:   Sodium 132 (*)    Potassium 3.1 (*)    Chloride 88 (*)    Glucose, Bld 137 (*)    AST 93 (*)    ALT 159 (*)    Anion gap 21 (*)    All other components within normal limits  CBC WITH DIFFERENTIAL/PLATELET - Abnormal; Notable for the following components:   Platelets 603 (*)    nRBC 0.3 (*)  All other components within normal limits  APTT - Abnormal; Notable for the following components:   aPTT 39 (*)    All other components within normal limits  PROTIME-INR - Abnormal; Notable for the following components:   Prothrombin Time 29.7 (*)    INR 2.9 (*)    All other components within normal limits  URINALYSIS,  ROUTINE W REFLEX MICROSCOPIC - Abnormal; Notable for the following components:   Color, Urine AMBER (*)    APPearance CLOUDY (*)    Bilirubin Urine SMALL (*)    Protein, ur 30 (*)    All other components within normal limits  CULTURE, BLOOD (ROUTINE X 2)  CULTURE, BLOOD (ROUTINE X 2)  URINE CULTURE  SARS CORONAVIRUS 2 (TAT 6-24 HRS)    EKG EKG Interpretation  Date/Time:  Saturday Dec 10, 2018 14:05:34 EST Ventricular Rate:  138 PR Interval:    QRS Duration: 85 QT Interval:  310 QTC Calculation: 470 R Axis:   20 Text Interpretation: Sinus tachycardia Probable left atrial enlargement Repol abnrm suggests ischemia, diffuse leads since last tracing no significant change Confirmed by Rolan Bucco [phone]) on 12-10-18 2:22:27 PM   Radiology Ct Head Wo Contrast  Result Date: 2018/12/10 CLINICAL DATA:  Altered level of consciousness EXAM: CT HEAD WITHOUT CONTRAST TECHNIQUE: Contiguous axial images were obtained from the base of the skull through the vertex without intravenous contrast. COMPARISON:  11/03/2018 FINDINGS: Brain: The brainstem, cerebellum, cerebral peduncles, thalami, basal ganglia, basilar cisterns, and ventricular system appear within normal limits. No intracranial hemorrhage, mass lesion, or acute CVA. Vascular: Unremarkable Skull: Unremarkable Sinuses/Orbits: Mild chronic right frontal sinusitis. Other: No supplemental non-categorized findings. IMPRESSION: 1. No significant intracranial abnormality is observed. 2. Mild chronic right frontal sinusitis. Electronically Signed   By: Gaylyn Rong M.D.   On: 12/10/18 19:02   Ct Abdomen Pelvis W Contrast  Result Date: 2018-12-10 CLINICAL DATA:  Abdominal pain. Shortness of breath. EXAM: CT ABDOMEN AND PELVIS WITH CONTRAST TECHNIQUE: Multidetector CT imaging of the abdomen and pelvis was performed using the standard protocol following bolus administration of intravenous contrast. CONTRAST:  OMNIPAQUE IOHEXOL  350 MG/ML SOLN COMPARISON:  11/24/2018 FINDINGS: Lower chest: New airspace opacity in the left lower lobe suspicious for pneumonia. Air fluid level in the distal esophagus probably from reflux. Hepatobiliary: Diffuse hepatic steatosis is suspected. Otherwise unremarkable. Pancreas: Unremarkable Spleen: Unremarkable Adrenals/Urinary Tract: Unremarkable Stomach/Bowel: There are a few mildly dilated loops of small bowel measuring up to 3.5 cm in diameter, with scattered air-fluid levels but without an obvious transition point or site of obstruction. Vascular/Lymphatic: Borderline prominent right gastric node 0.9 cm in short axis on image 24/3, previously 0.8 cm. Patient has an unusually small in caliber abdominal aorta, measuring 0.9 cm in diameter. Common origin of the celiac trunk and SMA. No obvious periaortic inflammatory findings. Reproductive: Unremarkable Other: No supplemental non-categorized findings. Musculoskeletal: Unremarkable IMPRESSION: 1. New airspace opacity in the left lower lobe suspicious for pneumonia. 2. There are a few mildly dilated loops of small bowel with scattered air-fluid levels but without an obvious transition point or site of obstruction. Ileus is favored over low-grade partial small bowel obstruction. 3. Diffuse hepatic steatosis. 4. Air fluid level in the distal esophagus probably from reflux. 5. Small caliber abdominal aorta, only about 0.9 cm in diameter, without periaortic inflammatory findings. 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 Consider follow up vascular surgical consultation. Electronically Signed   By: Gaylyn Rong M.D.   On: 12-10-2018 19:15  Dg Chest Port 1 View  Result Date: December 15, 2018 CLINICAL DATA:  Sepsis. EXAM: PORTABLE CHEST 1 VIEW COMPARISON:  11/21/2018 FINDINGS: A very poor inspiration is again demonstrated. The heart remains grossly normal in size. Interval minimal patchy and linear density in the left lower lobe. Clear right lung. Mild  peribronchial thickening. Unremarkable bones. IMPRESSION: 1. Interval minimal patchy and linear atelectasis or pneumonia in the left lower lobe. 2. Mild bronchitic changes. 3. Stable very poor inspiration. Electronically Signed   By: Beckie Salts M.D.   On: 15-Dec-2018 15:47   Dg Abd Portable 1v  Result Date: 2018-12-15 CLINICAL DATA:  Sepsis. Fever. EXAM: PORTABLE ABDOMEN - 1 VIEW COMPARISON:  11/23/2018 and abdomen and pelvis CT dated 11/24/2018. FINDINGS: Interval multiple mildly dilated loops of proximal small bowel with gas-filled normal caliber distal small bowel and colon. Unremarkable bones. IMPRESSION: Interval mild small bowel ileus or partial obstruction. No gross free air seen at this time. Electronically Signed   By: Beckie Salts M.D.   On: December 15, 2018 15:49    Procedures Procedures (including critical care time)  Medications Ordered in ED Medications  ceFEPIme (MAXIPIME) 2 g in sodium chloride 0.9 % 100 mL IVPB (has no administration in time range)  vancomycin (VANCOCIN) 1,250 mg in sodium chloride 0.9 % 250 mL IVPB (has no administration in time range)  ceFEPIme (MAXIPIME) 2 g in sodium chloride 0.9 % 100 mL IVPB (0 g Intravenous Stopped 12/15/2018 1538)  metroNIDAZOLE (FLAGYL) IVPB 500 mg (0 mg Intravenous Stopped 12-15-2018 1609)  sodium chloride 0.9 % bolus 1,000 mL (0 mLs Intravenous Stopped 2018-12-15 1517)  vancomycin (VANCOCIN) 2,500 mg in sodium chloride 0.9 % 500 mL IVPB (0 mg Intravenous Stopped 12-15-18 1710)  sodium chloride 0.9 % bolus 1,000 mL (0 mLs Intravenous Stopped 12-15-2018 1738)  sodium chloride 0.9 % bolus 1,000 mL (0 mLs Intravenous Stopped 12-15-18 1847)  sodium chloride 0.9 % bolus 1,500 mL (1,500 mLs Intravenous New Bag/Given 2018/12/15 1734)  iohexol (OMNIPAQUE) 350 MG/ML injection 120 mL (120 mLs Intravenous Contrast Given 12-15-2018 1821)     Initial Impression / Assessment and Plan / ED Course  I have reviewed the triage vital signs and the nursing  notes.  Pertinent labs & imaging results that were available during my care of the patient were reviewed by me and considered in my medical decision making (see chart for details).        Patient is a 23 year old male who presents by EMS with his mom for ongoing vomiting with some rails noted by hospice nurse.  He is also had decreased urine output with tea colored urine.  He presented in sepsis.  He was hypotensive on arrival.  He was treated per sepsis protocol with a full 30 cc/kg fluid bolus as well as broad-spectrum IV antibiotics.  His chest x-ray shows evidence of pneumonia.  His Covid test is pending.  KUB shows possible obstruction so a CT scan was performed which shows probable ileus, less likely bowel obstruction.  His urine does not appear to be infected.  His kidney function is normal.  He was noted to be markedly hypotensive on arrival however this has improved with IV fluids.  His blood pressure is still borderline but not requiring pressors at this point.  He is oxygenating normally without supplemental oxygen.  His lactate was over 5 on arrival but has improved to 4.1.  I spoke with the hospitalist, Dr. Nelda Bucks who will admit the patient for further treatment.  CRITICAL CARE Performed  by: Rolan Bucco Total critical care time: 75 minutes Critical care time was exclusive of separately billable procedures and treating other patients. Critical care was necessary to treat or prevent imminent or life-threatening deterioration. Critical care was time spent personally by me on the following activities: development of treatment plan with patient and/or surrogate as well as nursing, discussions with consultants, evaluation of patient's response to treatment, examination of patient, obtaining history from patient or surrogate, ordering and performing treatments and interventions, ordering and review of laboratory studies, ordering and review of radiographic studies, pulse oximetry and  re-evaluation of patient's condition.   Final Clinical Impressions(s) / ED Diagnoses   Final diagnoses:  Septic shock (HCC)  Community acquired pneumonia of left lower lobe of lung    ED Discharge Orders    None       Rolan Bucco, MD 12/21/18 [phone]

## 2018-12-08 NOTE — Progress Notes (Signed)
Pharmacy Antibiotic Note  Aaron Vega is a 23 y.o. male admitted on 16-Dec-2018 with sepsis. Pharmacy has been consulted for vancomycin and cefepime dosing. Pt is febrile with Tmax 101.2 and WBC is WNL. Scr is WNL but more than double patients normal baseline.   Plan: Vancomycin 2500mg  IV x 1 then 1250mg  IV Q12H Cefepime 2gm IV Q8H  F/u renal fxn, C&S, clinical status and peak/trough at SS  Height: 5\' 11"  (180.3 cm) Weight: 298 lb (135.2 kg)(stated by mother) IBW/kg (Calculated) : 75.3  Temp (24hrs), Avg:100.2 F (37.9 C), Min:99.1 F (37.3 C), Max:101.2 F (38.4 C)  Recent Labs  Lab 2018-12-16 1425 12-16-2018 1511  WBC 9.1  --   CREATININE 1.18  --   LATICACIDVEN  --  5.4*    Estimated Creatinine Clearance: 139.1 mL/min (by C-G formula based on SCr of 1.18 mg/dL).    No Known Allergies  Antimicrobials this admission: Vanc 11/14>> Cefepime 11/14>> Flagyl x 1 11/14  Dose adjustments this admission: N/A  Microbiology results: Pending  Thank you for allowing pharmacy to be a part of this patient's care.  Ivey Nembhard, Rande Lawman 16-Dec-2018 2:14 PM

## 2018-12-08 NOTE — ED Triage Notes (Signed)
Patient from home with [facility]. Called out for shortness of breath by mother. 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 Patient progressively less active after TBI. Patient does not follow commands but does respond to stimulus.

## 2018-12-08 NOTE — ED Notes (Signed)
Called CT to have them pick up pt.

## 2018-12-08 NOTE — ED Notes (Signed)
Report attempted 

## 2018-12-09 ENCOUNTER — Other Ambulatory Visit (HOSPITAL_COMMUNITY): Payer: Medicaid Other

## 2018-12-09 ENCOUNTER — Other Ambulatory Visit: Payer: Self-pay

## 2018-12-09 ENCOUNTER — Inpatient Hospital Stay (HOSPITAL_COMMUNITY)

## 2018-12-09 ENCOUNTER — Encounter (HOSPITAL_COMMUNITY): Payer: Self-pay

## 2018-12-09 DIAGNOSIS — J690 Pneumonitis due to inhalation of food and vomit: Secondary | ICD-10-CM | POA: Diagnosis not present

## 2018-12-09 DIAGNOSIS — F840 Autistic disorder: Secondary | ICD-10-CM | POA: Diagnosis not present

## 2018-12-09 DIAGNOSIS — A419 Sepsis, unspecified organism: Secondary | ICD-10-CM | POA: Diagnosis not present

## 2018-12-09 DIAGNOSIS — K5900 Constipation, unspecified: Secondary | ICD-10-CM | POA: Diagnosis not present

## 2018-12-09 LAB — COMPREHENSIVE METABOLIC PANEL
ALT: 124 U/L — ABNORMAL HIGH (ref 0–44)
AST: 71 U/L — ABNORMAL HIGH (ref 15–41)
Albumin: 2.9 g/dL — ABNORMAL LOW (ref 3.5–5.0)
Alkaline Phosphatase: 39 U/L (ref 38–126)
Anion gap: 14 (ref 5–15)
BUN: 14 mg/dL (ref 6–20)
CO2: 23 mmol/L (ref 22–32)
Calcium: 8.5 mg/dL — ABNORMAL LOW (ref 8.9–10.3)
Chloride: 98 mmol/L (ref 98–111)
Creatinine, Ser: 0.56 mg/dL — ABNORMAL LOW (ref 0.61–1.24)
GFR calc Af Amer: >60 mL/min (ref >60)
GFR calc non Af Amer: >60 mL/min (ref >60)
Glucose, Bld: 107 mg/dL — ABNORMAL HIGH (ref 70–99)
Potassium: 3.5 mmol/L (ref 3.5–5.1)
Sodium: 135 mmol/L (ref 135–145)
Total Bilirubin: 1.3 mg/dL — ABNORMAL HIGH (ref 0.3–1.2)
Total Protein: 5.6 g/dL — ABNORMAL LOW (ref 6.5–8.1)

## 2018-12-09 LAB — CBC WITH DIFFERENTIAL/PLATELET
Abs Immature Granulocytes: 0.04 10*3/uL (ref 0.00–0.07)
Basophils Absolute: 0 10*3/uL (ref 0.0–0.1)
Basophils Relative: 0 %
Eosinophils Absolute: 0 10*3/uL (ref 0.0–0.5)
Eosinophils Relative: 0 %
HCT: 37.3 % — ABNORMAL LOW (ref 39.0–52.0)
Hemoglobin: 12.4 g/dL — ABNORMAL LOW (ref 13.0–17.0)
Immature Granulocytes: 1 %
Lymphocytes Relative: 19 %
Lymphs Abs: 1.1 10*3/uL (ref 0.7–4.0)
MCH: 27.6 pg (ref 26.0–34.0)
MCHC: 33.2 g/dL (ref 30.0–36.0)
MCV: 82.9 fL (ref 80.0–100.0)
Monocytes Absolute: 0.5 10*3/uL (ref 0.1–1.0)
Monocytes Relative: 10 %
Neutro Abs: 3.9 10*3/uL (ref 1.7–7.7)
Neutrophils Relative %: 70 %
Platelets: 358 10*3/uL (ref 150–400)
RBC: 4.5 MIL/uL (ref 4.22–5.81)
RDW: 13 % (ref 11.5–15.5)
WBC: 5.6 10*3/uL (ref 4.0–10.5)
nRBC: 0 % (ref 0.0–0.2)

## 2018-12-09 LAB — URINE CULTURE: Culture: NO GROWTH

## 2018-12-09 LAB — PROTIME-INR
INR: 2.8 — ABNORMAL HIGH (ref 0.8–1.2)
Prothrombin Time: 29.5 seconds — ABNORMAL HIGH (ref 11.4–15.2)

## 2018-12-09 LAB — CORTISOL-AM, BLOOD: Cortisol - AM: 17.9 ug/dL (ref 6.7–22.6)

## 2018-12-09 LAB — GLUCOSE, CAPILLARY: Glucose-Capillary: 99 mg/dL (ref 70–99)

## 2018-12-09 LAB — PROCALCITONIN: Procalcitonin: 0.36 ng/mL

## 2018-12-09 LAB — LACTIC ACID, PLASMA
Lactic Acid, Venous: 4.5 mmol/L (ref 0.5–1.9)
Lactic Acid, Venous: 4.7 mmol/L (ref 0.5–1.9)
Lactic Acid, Venous: 7.2 mmol/L (ref 0.5–1.9)

## 2018-12-09 IMAGING — DX DG CHEST 1V PORT
1 series · 1 of 1 positions shown · non-contrast
Comparison: [DATE]

CLINICAL DATA: Tachypnea, low-grade fever

EXAM:
PORTABLE CHEST 1 VIEW

[chest ap]
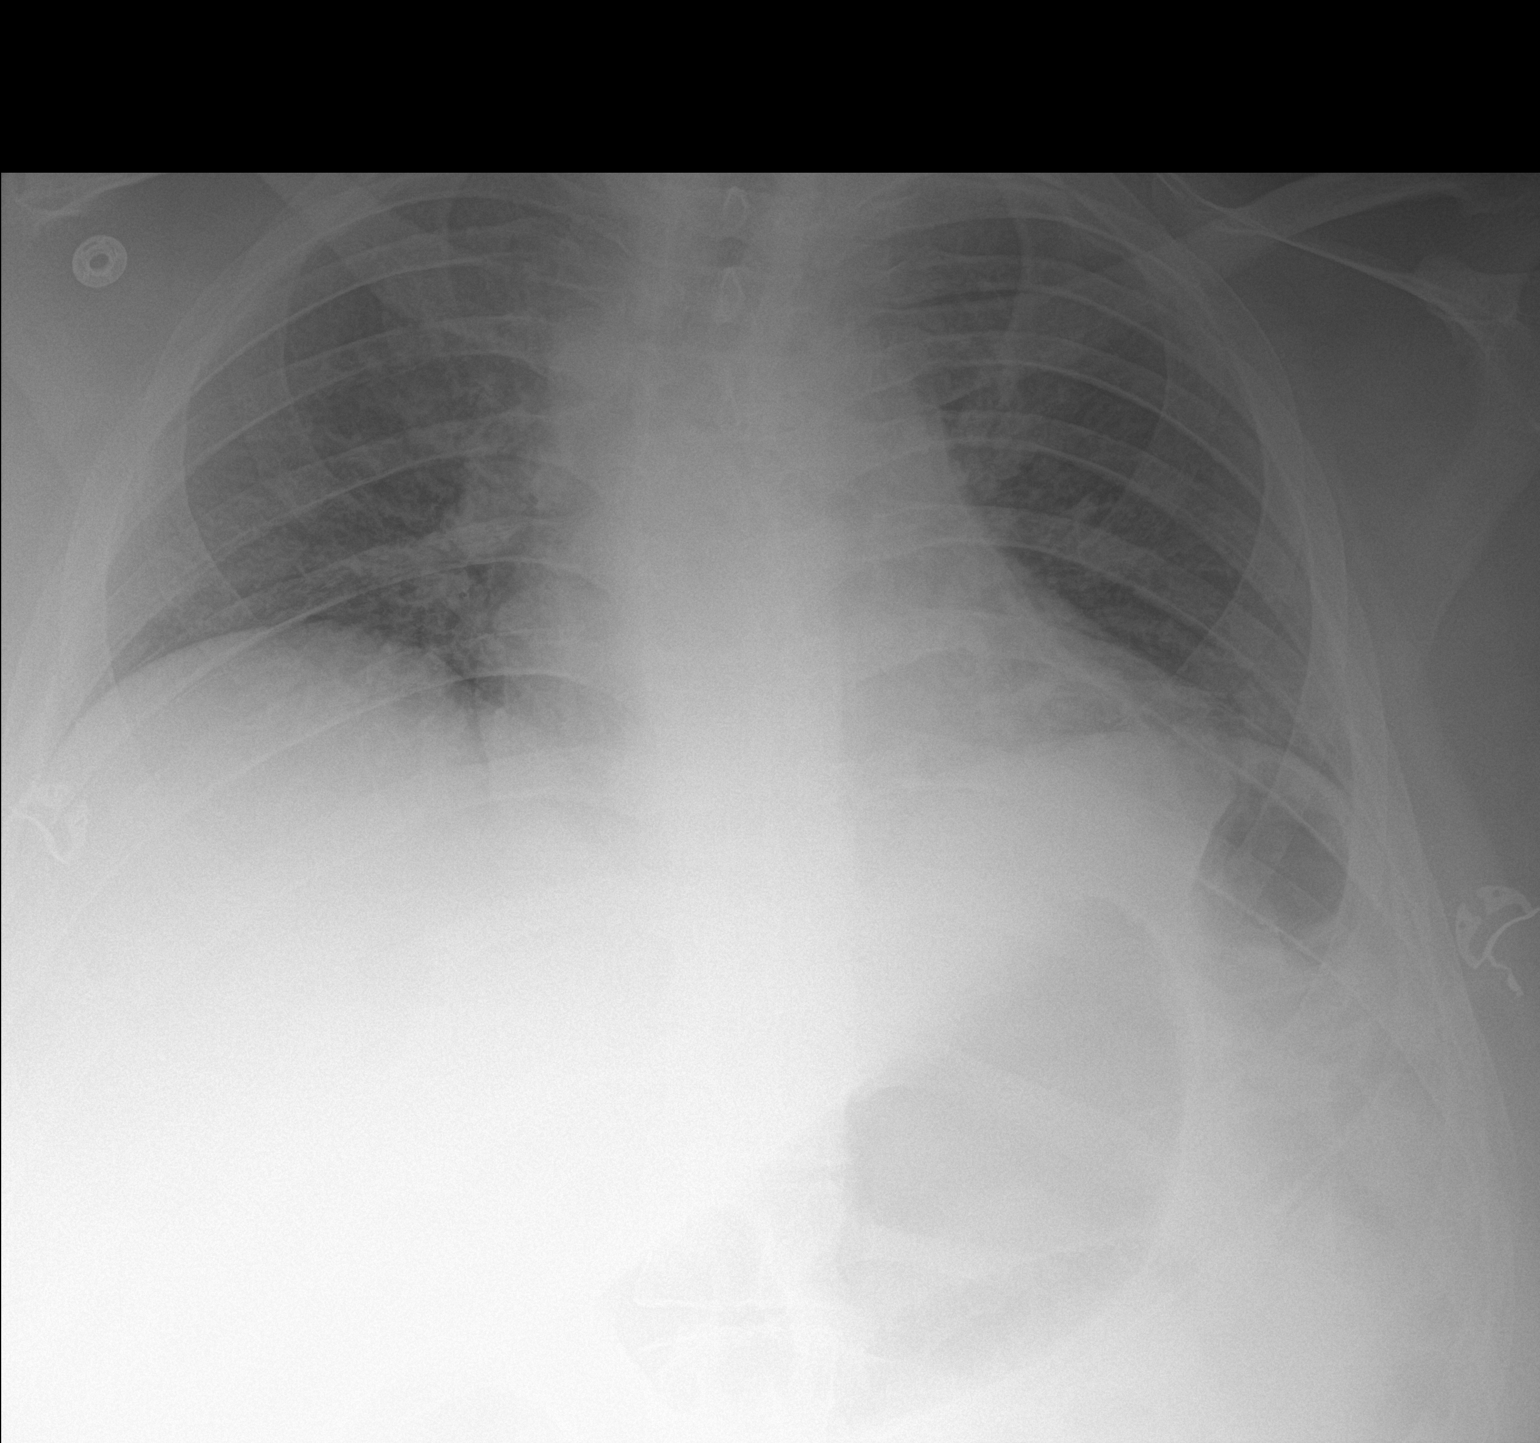

[1 of 1 positions shown; findings below may reference images not displayed]

FINDINGS: Low lung volumes. Heart size is accentuated by the low volumes.
Bibasilar atelectasis. No effusions. No acute bony abnormality.
IMPRESSION: Low lung volumes, bibasilar atelectasis.

## 2018-12-09 IMAGING — DX DG ABD PORTABLE 1V
1 series · 1 of 1 positions shown · non-contrast
Comparison: [DATE]

CLINICAL DATA: Nasogastric tube placement.

EXAM:
PORTABLE ABDOMEN - 1 VIEW

[abdomen kub]
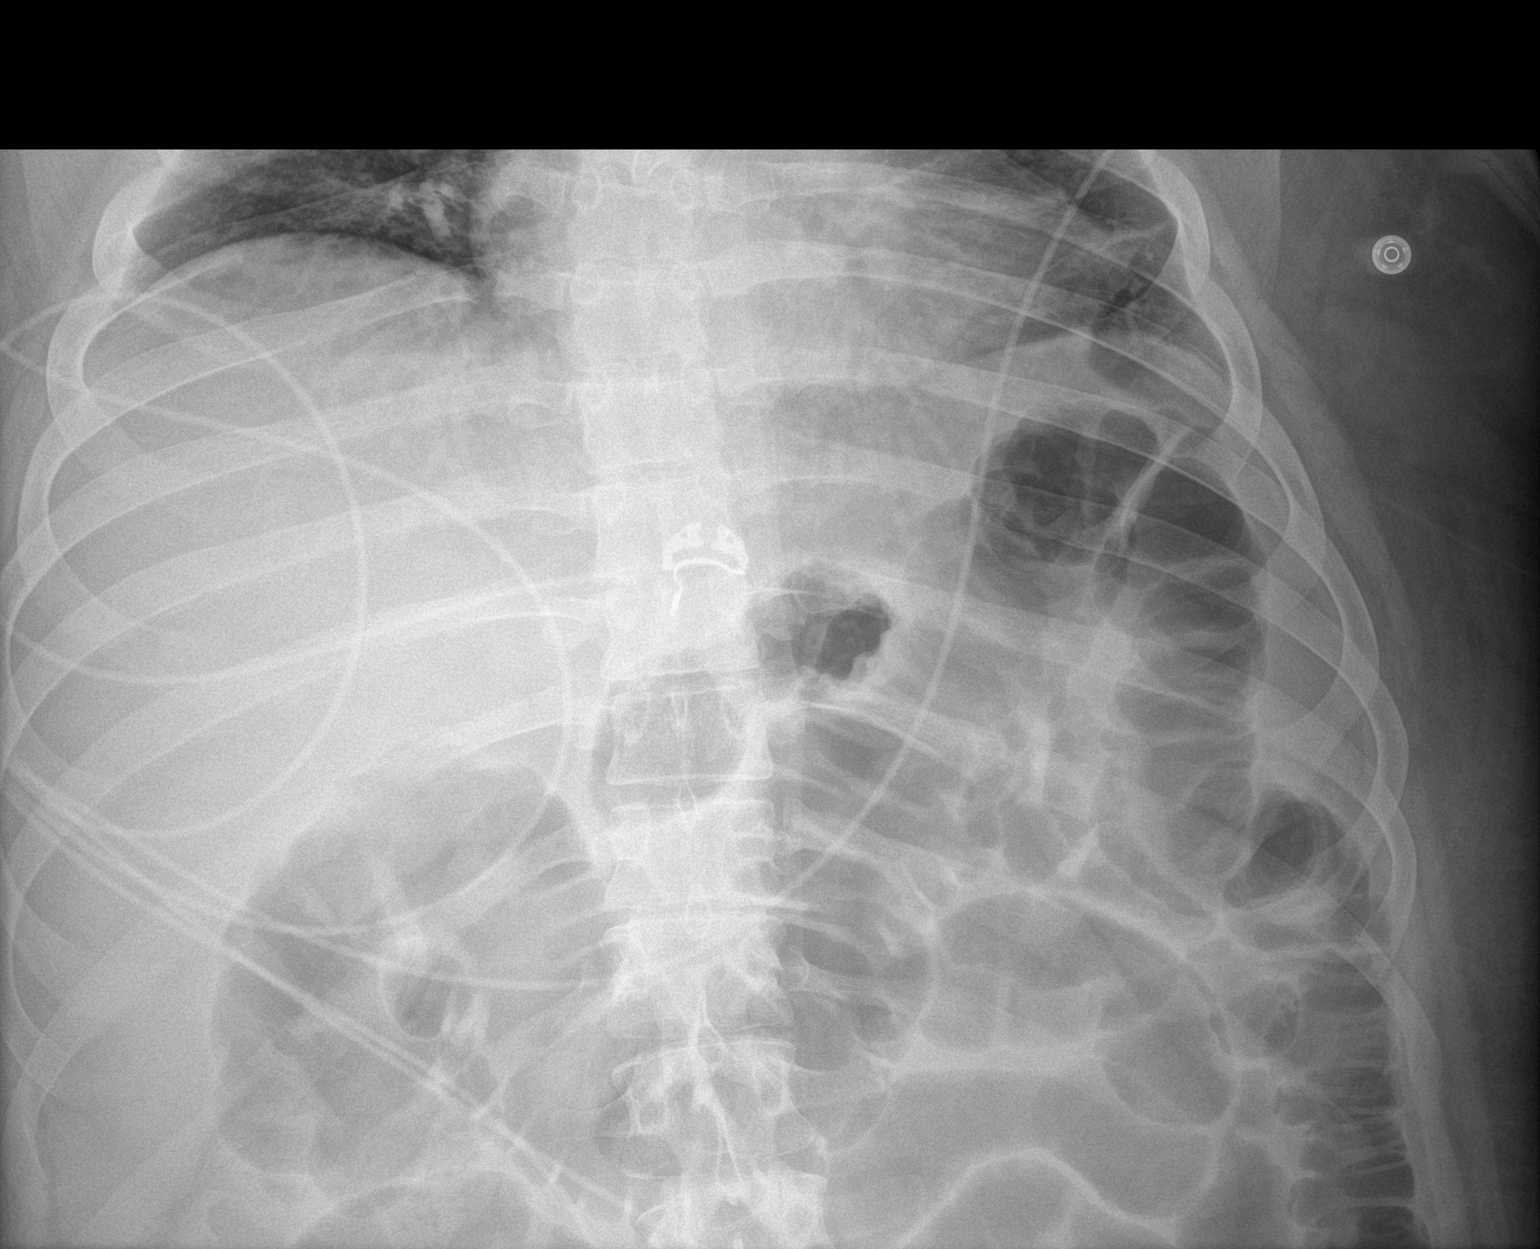

[1 of 1 positions shown; findings below may reference images not displayed]

FINDINGS: No nasogastric tube seen in the lower chest or upper abdomen.
Multiple dilated small bowel loops with mild improvement. Gas is
again demonstrated in normal caliber colon. Mild scoliosis.
IMPRESSION: 1. No nasogastric tube seen in the lower chest or upper abdomen.
2. Mildly improved small bowel obstruction.

## 2018-12-09 MED ORDER — BISACODYL 10 MG RE SUPP
10.0000 mg | Freq: Every day | RECTAL | Status: DC | PRN
  Administered 2018-12-13: 09:00:00 10 mg via RECTAL
  Filled 2018-12-09 (×2): qty 1

## 2018-12-09 MED ORDER — PANTOPRAZOLE SODIUM 40 MG IV SOLR: 40.0000 mg | INTRAVENOUS | Status: DC

## 2018-12-09 MED ORDER — NYSTATIN 100000 UNIT/GM EX POWD
Freq: Every day | CUTANEOUS | Status: DC
  Administered 2018-12-09 – 2018-12-20 (×11): via TOPICAL
  Administered 2018-12-22: 1 via TOPICAL
  Administered 2018-12-23 – 2019-01-07 (×14): via TOPICAL
  Administered 2019-01-20: 1 via TOPICAL
  Filled 2018-12-09 (×6): qty 15

## 2018-12-09 MED ORDER — SODIUM CHLORIDE 0.9% FLUSH: 10.0000 mL | INTRAVENOUS | Status: DC | PRN

## 2018-12-09 MED ORDER — MELATONIN 3 MG PO TABS
6.0000 mg | ORAL_TABLET | Freq: Every day | ORAL | Status: DC
  Filled 2018-12-09: qty 2

## 2018-12-09 MED ORDER — SODIUM CHLORIDE 0.9 % IV BOLUS
1000.0000 mL | INTRAVENOUS | Status: AC
  Administered 2018-12-09: 22:00:00 1000 mL via INTRAVENOUS
  Administered 2018-12-09: 650 mL via INTRAVENOUS

## 2018-12-09 MED ORDER — SODIUM CHLORIDE 0.9% FLUSH
10.0000 mL | Freq: Two times a day (BID) | INTRAVENOUS | Status: DC
  Administered 2018-12-09 – 2019-01-10 (×48): 10 mL

## 2018-12-09 MED ORDER — LEVOTHYROXINE SODIUM 25 MCG PO TABS: 25.0000 ug | ORAL_TABLET | Freq: Every day | ORAL | Status: DC

## 2018-12-09 MED ORDER — SUCRALFATE 1 GM/10ML PO SUSP: 1.0000 g | Freq: Three times a day (TID) | ORAL | Status: DC

## 2018-12-09 MED ORDER — LORAZEPAM 2 MG/ML IJ SOLN
1.0000 mg | Freq: Four times a day (QID) | INTRAMUSCULAR | Status: DC | PRN
  Administered 2018-12-09 – 2018-12-23 (×5): 1 mg via INTRAVENOUS
  Filled 2018-12-09 (×5): qty 1

## 2018-12-09 MED ORDER — ACETAMINOPHEN 650 MG RE SUPP
650.0000 mg | Freq: Four times a day (QID) | RECTAL | Status: DC | PRN
  Administered 2018-12-09 – 2018-12-12 (×8): 650 mg via RECTAL
  Filled 2018-12-09 (×8): qty 1

## 2018-12-09 MED ORDER — BISACODYL 5 MG PO TBEC
5.0000 mg | DELAYED_RELEASE_TABLET | Freq: Every day | ORAL | Status: DC | PRN
  Administered 2018-12-11: 5 mg via ORAL
  Filled 2018-12-09 (×3): qty 1

## 2018-12-09 MED ORDER — LIP MEDEX EX OINT
TOPICAL_OINTMENT | CUTANEOUS | Status: DC | PRN
  Administered 2018-12-09: 01:00:00 via TOPICAL
  Filled 2018-12-09 (×2): qty 7

## 2018-12-09 MED ORDER — CLOBAZAM 10 MG PO TABS
60.0000 mg | ORAL_TABLET | Freq: Two times a day (BID) | ORAL | Status: DC
  Administered 2018-12-09 – 2018-12-11 (×4): 60 mg via ORAL
  Filled 2018-12-09 (×4): qty 6

## 2018-12-09 MED ORDER — LACTATED RINGERS IV BOLUS
1000.0000 mL | Freq: Once | INTRAVENOUS | Status: AC
  Administered 2018-12-09: 03:00:00 1000 mL via INTRAVENOUS

## 2018-12-09 MED ORDER — HYDROCORTISONE NA SUCCINATE PF 100 MG IJ SOLR
50.0000 mg | Freq: Four times a day (QID) | INTRAMUSCULAR | Status: DC
  Administered 2018-12-09 – 2018-12-11 (×10): 50 mg via INTRAVENOUS
  Filled 2018-12-09 (×10): qty 2

## 2018-12-09 MED ORDER — MORPHINE SULFATE (PF) 2 MG/ML IV SOLN
2.0000 mg | Freq: Once | INTRAVENOUS | Status: AC
  Administered 2018-12-09: 2 mg via INTRAVENOUS
  Filled 2018-12-09: qty 1

## 2018-12-09 MED ORDER — LINACLOTIDE 145 MCG PO CAPS
290.0000 ug | ORAL_CAPSULE | Freq: Every day | ORAL | Status: DC
  Filled 2018-12-09: qty 2

## 2018-12-09 MED ORDER — SODIUM CHLORIDE 0.9% FLUSH
3.0000 mL | Freq: Two times a day (BID) | INTRAVENOUS | Status: DC
  Administered 2018-12-09 – 2018-12-18 (×18): 3 mL via INTRAVENOUS

## 2018-12-09 MED ORDER — SODIUM CHLORIDE 0.9% FLUSH: 3.0000 mL | INTRAVENOUS | Status: DC | PRN

## 2018-12-09 MED ORDER — SODIUM CHLORIDE 0.9 % IV SOLN
250.0000 mL | INTRAVENOUS | Status: DC | PRN
  Administered 2018-12-12 – 2018-12-16 (×2): 250 mL via INTRAVENOUS

## 2018-12-09 MED ORDER — PROMETHAZINE HCL 25 MG RE SUPP
25.0000 mg | Freq: Four times a day (QID) | RECTAL | Status: DC | PRN
  Filled 2018-12-09: qty 1

## 2018-12-09 MED ORDER — PANTOPRAZOLE SODIUM 40 MG PO TBEC: 40.0000 mg | DELAYED_RELEASE_TABLET | Freq: Every day | ORAL | Status: DC

## 2018-12-09 MED ORDER — LISINOPRIL 10 MG PO TABS: 20.0000 mg | ORAL_TABLET | Freq: Every day | ORAL | Status: DC

## 2018-12-09 MED ORDER — CLOBAZAM 10 MG PO TABS: 60.0000 mg | ORAL_TABLET | Freq: Two times a day (BID) | ORAL | Status: DC

## 2018-12-09 MED ORDER — LORAZEPAM 2 MG/ML IJ SOLN: 2.0000 mg | INTRAMUSCULAR | Status: DC | PRN

## 2018-12-09 MED ORDER — METOPROLOL TARTRATE 5 MG/5ML IV SOLN
5.0000 mg | Freq: Once | INTRAVENOUS | Status: AC
  Administered 2018-12-09: 5 mg via INTRAVENOUS
  Filled 2018-12-09: qty 5

## 2018-12-09 MED ORDER — ONDANSETRON HCL 4 MG/2ML IJ SOLN
4.0000 mg | Freq: Four times a day (QID) | INTRAMUSCULAR | Status: DC | PRN
  Administered 2018-12-09 – 2018-12-30 (×3): 4 mg via INTRAVENOUS
  Filled 2018-12-09 (×3): qty 2

## 2018-12-09 NOTE — H&P (Signed)
History and Physical    Aaron Vega YKZ:[id number] DOB: [birth year] DOA: 12-28-18  [name]    Patient coming from: Home    Chief Complaint: Change in mental status lethargic  HPI: Aaron Vega is a 23 y.o. male with medical history significant of seizures, obesity hypertension constipation and severe outages. Patient was able to have almost a normal life except without his till he has a seizures in August. 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 Since then his health had a progressive decline. Patient currently is under hospice care..  For the last 2 days patient was more lethargic, had ongoing vomiting which is are not uncommon for him because he has history of constipation over the last few months.  He is not urinating.  He has discomfort with urination/ the hospice nurse thought that he has some crackles in his lungs and he was sent here for further evaluation.  Patient is nonverbal and currently follows some commands  ED Course:  In the emergency room he was found to be septic With left lower lobe infiltrate Elevated lactic acid 4.5 CT abdomen and pelvis small bowel obstruction versus ileus with suspicion of mid aortic syndrome. Patient was started to call on cefepime Flagyl and vancomycin  Review of Systems: As per HPI otherwise 10 point review of systems negative.  Unable to evaluate because of the patient mental status Vomiting Discomfort with urination   Past Medical History:  Diagnosis Date  . Autistic disorder, current or active state   . Fatty liver   . Hypertension   . Obesity   . Pancreatitis    at age 79 years  . Pneumonia   . Pre-diabetes   . Seizures (HCC)    as of 11/23/15 - no seizures for 3 month  . Sleep apnea    not on cpap (can't tolerate)  . Tics of organic origin   . Tourette syndrome     Past Surgical History:  Procedure Laterality Date  . BIOPSY  11/08/2018   Procedure: BIOPSY;  Surgeon: Beverley Fiedler, MD;  Location: [facility];  Service: Gastroenterology;;  . ESOPHAGOGASTRODUODENOSCOPY (EGD) WITH PROPOFOL N/A 11/08/2018   Procedure: ESOPHAGOGASTRODUODENOSCOPY (EGD) WITH PROPOFOL;  Surgeon: Beverley Fiedler, MD;  Location: [facility];  Service: Gastroenterology;  Laterality: N/A;  . FLEXIBLE SIGMOIDOSCOPY N/A 11/21/2018   Procedure: FLEXIBLE SIGMOIDOSCOPY;  Surgeon: Lemar Lofty., MD;  Location: [facility];  Service: Gastroenterology;  Laterality: N/A;  . IMPACTION REMOVAL  11/21/2018   Procedure: IMPACTION REMOVAL;  Surgeon: Lemar Lofty., MD;  Location: [facility];  Service: Gastroenterology;;  . RADIOLOGY WITH ANESTHESIA N/A 11/24/2015   Procedure: CT SCAN ABDOMEN AND PELVIS WITH CONTRAST;  Surgeon: Medication Radiologist, MD;  Location: [facility];  Service: Radiology;  Laterality: N/A;  . TONSILLECTOMY     and adenoidectomy     reports that he has never smoked. He has never used smokeless tobacco. He reports that he does not drink alcohol or use drugs.  No Known Allergies  Family History  Problem Relation Age of Onset  . Seizures Maternal Aunt   . Anxiety disorder Paternal Uncle   . Depression Paternal Uncle   . Bipolar disorder Paternal Uncle   . Seizures Cousin        Maternal 1st and 2nd cousins have seizures  . Anxiety disorder Other        Paternal fhx  . Depression Other        Paternal fhx  . Hypertension Other  Paternal fhx  . Bipolar disorder Other        Paternal fhx     Prior to Admission medications   Medication Sig Start Date End Date Taking? Authorizing Provider  acetaminophen (TYLENOL) 650 MG suppository Place 650 mg rectally every 6 (six) hours as needed for mild pain or fever.  11/05/18  Yes [provider]  citalopram (CELEXA) 40 MG tablet Take 40 mg by mouth daily. 04/24/14  Yes [provider]  cloBAZam (ONFI) 20 MG tablet Take 60 mg by mouth 2 (two) times daily. 10/16/18  Yes [provider]   diazepam (DIASAT) 20 MG GEL Place 20 mg rectally daily as needed (for seizures lasting more than 5 minutes.).  07/03/13  Yes [provider]  diazepam (VALIUM) 5 MG tablet Take 7.5 mg by mouth every morning. May give 0.5 tablet (2.5mg ) as needed for agitation/anxiety 10/15/18  Yes [provider]  levothyroxine (SYNTHROID) 25 MCG tablet Take 1 tablet (25 mcg total) by mouth daily before breakfast. 11/25/18  Yes Narda Bonds, MD  lisinopril (PRINIVIL,ZESTRIL) 20 MG tablet Take 20 mg by mouth daily. Take with  tablet for a total dose of  04/15/14  Yes [provider]  lisinopril (ZESTRIL) 5 MG tablet Take 5 mg by mouth daily. Take with  tablet for a total dose of  10/14/18  Yes [provider]  Melatonin 5 MG TABS Take 5 mg by mouth at bedtime.   Yes [provider]  Midazolam (NAYZILAM) 5 MG/0.1ML SOLN Place 5 mg into the nose daily as needed (seizure).   Yes [provider]  nystatin (MYCOSTATIN/NYSTOP) 100000 UNIT/GM POWD Apply 1 application topically daily. Neck, crevices, etc. 04/23/14  Yes [provider]  omeprazole (PRILOSEC) 40 MG capsule Take 1 capsule (40 mg total) by mouth 2 (two) times daily. Open capsule and sprinkle into applesauce or yoghurt. 11/27/18  Yes Narda Bonds, MD  ondansetron (ZOFRAN ODT) 4 MG disintegrating tablet Take 1 tablet (4 mg total) by mouth every 8 (eight) hours as needed for nausea or vomiting. 11/25/18  Yes Narda Bonds, MD  polyethylene glycol (MIRALAX) 17 g packet Take 17 g by mouth 2 (two) times daily. Patient taking differently: Take 17 g by mouth daily as needed for mild constipation.  11/25/18  Yes Narda Bonds, MD  promethazine (PHENERGAN) 25 MG suppository Place 1 suppository (25 mg total) rectally every 6 (six) hours as needed for nausea or vomiting. 11/12/18  Yes Sharen Heck J, PA-C  sucralfate (CARAFATE) 1 GM/10ML suspension Take 10 mLs (1 g total) by mouth 4 (four) times  daily -  with meals and at bedtime. 11/27/18  Yes Narda Bonds, MD  linaclotide Karlene Einstein) 290 MCG CAPS capsule Take 1 capsule (290 mcg total) by mouth daily before breakfast. Patient not taking: Reported on 16-Dec-2018 11/26/18   Narda Bonds, MD  MILK OF MAGNESIA 400 MG/5ML suspension Take 30-60 mLs by mouth daily as needed for constipation. 12/06/18   [provider]  zonisamide (ZONEGRAN) 100 MG capsule Take 100-200 mg by mouth See admin instructions. Take one capsule every other night for two weeks, then take on capsule every night for two weeks, then take  every night. 11/27/18   [provider]    Physical Exam: Vitals:   12/09/18 0235 12/09/18 0300 12/09/18 0325 12/09/18 0451  BP: 135/80 (!) 150/122 (!) 146/94 (!) 131/91  Pulse: (!) 143 (!) 146 (!) 147 (!) 153  Resp: Marland Kitchen)  24 (!) 29 (!) 33 (!) 42  Temp: 99 F (37.2 C)   (!) 100.9 F (38.3 C)  TempSrc: Oral   Oral  SpO2: 96% 95% 96% 96%  Weight:      Height:        Constitutional: NAD, calm, comfortable Vitals:   12/09/18 0235 12/09/18 0300 12/09/18 0325 12/09/18 0451  BP: 135/80 (!) 150/122 (!) 146/94 (!) 131/91  Pulse: (!) 143 (!) 146 (!) 147 (!) 153  Resp: (!) 24 (!) 29 (!) 33 (!) 42  Temp: 99 F (37.2 C)   (!) 100.9 F (38.3 C)  TempSrc: Oral   Oral  SpO2: 96% 95% 96% 96%  Weight:      Height:       Eyes: PERRL, lids and conjunctivae normal ENMT: Mucous membranes are moist. Posterior pharynx clear of any exudate or lesions.Normal dentition.  Neck: normal, supple, no masses, no thyromegaly Respiratory: clear to auscultation bilaterally, no wheezing, no crackles. Normal respiratory effort. No accessory muscle use.  Cardiovascular: Regular rate and rhythm, no murmurs / rubs / gallops. No extremity edema. 2+ pedal pulses. No carotid bruits.  Abdomen: no tenderness, no masses palpated. No hepatosplenomegaly. Bowel sounds positive.  Musculoskeletal: no clubbing / cyanosis. No joint deformity upper  and lower extremities. Good ROM, no contractures. Normal muscle tone.  Skin: no rashes, lesions, ulcers. No induration Neurologic: No focal neurologic deficits Psychiatric: Unable to evaluate   Labs on Admission: I have personally reviewed following labs and imaging studies  CBC: Recent Labs  Lab 12/07/2018 1425 12/09/18 0114  WBC 9.1 5.6  NEUTROABS 6.4 3.9  HGB 14.0 12.4*  HCT 42.3 37.3*  MCV 82.1 82.9  PLT 603* 358   Basic Metabolic Panel: Recent Labs  Lab 12/19/2018 1425 12/09/18 0114  NA 132* 135  K 3.1* 3.5  CL 88* 98  CO2 23 23  GLUCOSE 137* 107*  BUN 19 14  CREATININE 1.18 0.56*  CALCIUM 9.5 8.5*   GFR: Estimated Creatinine Clearance: 205.2 mL/min (A) (by C-G formula based on SCr of 0.56 mg/dL (L)). Liver Function Tests: Recent Labs  Lab 11/25/2018 1425 12/09/18 0114  AST 93* 71*  ALT 159* 124*  ALKPHOS 49 39  BILITOT 1.1 1.3*  PROT 6.5 5.6*  ALBUMIN 3.5 2.9*   No results for input(s): LIPASE, AMYLASE in the last 168 hours. No results for input(s): AMMONIA in the last 168 hours. Coagulation Profile: Recent Labs  Lab 11/30/2018 1425 12/09/18 0114  INR 2.9* 2.8*   Cardiac Enzymes: No results for input(s): CKTOTAL, CKMB, CKMBINDEX, TROPONINI in the last 168 hours. BNP (last 3 results) No results for input(s): PROBNP in the last 8760 hours. HbA1C: No results for input(s): HGBA1C in the last 72 hours. CBG: No results for input(s): GLUCAP in the last 168 hours. Lipid Profile: No results for input(s): CHOL, HDL, LDLCALC, TRIG, CHOLHDL, LDLDIRECT in the last 72 hours. Thyroid Function Tests: No results for input(s): TSH, T4TOTAL, FREET4, T3FREE, THYROIDAB in the last 72 hours. Anemia Panel: No results for input(s): VITAMINB12, FOLATE, FERRITIN, TIBC, IRON, RETICCTPCT in the last 72 hours. Urine analysis:    Component Value Date/Time   COLORURINE AMBER (A) 12/21/2018 1733   APPEARANCEUR CLOUDY (A) 12/21/2018 1733   LABSPEC 1.025 12/07/2018 1733    PHURINE 5.0 12/09/2018 1733   GLUCOSEU NEGATIVE 12/24/2018 1733   HGBUR NEGATIVE 12/15/2018 1733   BILIRUBINUR SMALL (A) 11/29/2018 1733   KETONESUR NEGATIVE 12/14/2018 1733   PROTEINUR 30 (A) 12/19/2018 1733  NITRITE NEGATIVE 2019-01-06 1733   LEUKOCYTESUR NEGATIVE 06-Jan-2019 1733    Radiological Exams on Admission: Ct Head Wo Contrast  Result Date: 2019/01/06 CLINICAL DATA:  Altered level of consciousness EXAM: CT HEAD WITHOUT CONTRAST TECHNIQUE: Contiguous axial images were obtained from the base of the skull through the vertex without intravenous contrast. COMPARISON:  11/03/2018 FINDINGS: Brain: The brainstem, cerebellum, cerebral peduncles, thalami, basal ganglia, basilar cisterns, and ventricular system appear within normal limits. No intracranial hemorrhage, mass lesion, or acute CVA. Vascular: Unremarkable Skull: Unremarkable Sinuses/Orbits: Mild chronic right frontal sinusitis. Other: No supplemental non-categorized findings. IMPRESSION: 1. No significant intracranial abnormality is observed. 2. Mild chronic right frontal sinusitis. Electronically Signed   By: Van Clines M.D.   On: Jan 06, 2019 19:02   Ct Abdomen Pelvis W Contrast  Result Date: 01/06/19 CLINICAL DATA:  Abdominal pain. Shortness of breath. EXAM: CT ABDOMEN AND PELVIS WITH CONTRAST TECHNIQUE: Multidetector CT imaging of the abdomen and pelvis was performed using the standard protocol following bolus administration of intravenous contrast. CONTRAST:  138mL OMNIPAQUE IOHEXOL 350 MG/ML SOLN COMPARISON:  11/24/2018 FINDINGS: Lower chest: New airspace opacity in the left lower lobe suspicious for pneumonia. Air fluid level in the distal esophagus probably from reflux. Hepatobiliary: Diffuse hepatic steatosis is suspected. Otherwise unremarkable. Pancreas: Unremarkable Spleen: Unremarkable Adrenals/Urinary Tract: Unremarkable Stomach/Bowel: There are a few mildly dilated loops of small bowel measuring up to 3.5 cm in  diameter, with scattered air-fluid levels but without an obvious transition point or site of obstruction. Vascular/Lymphatic: Borderline prominent right gastric node 0.9 cm in short axis on image 24/3, previously 0.8 cm. Patient has an unusually small in caliber abdominal aorta, measuring 0.9 cm in diameter. Common origin of the celiac trunk and SMA. No obvious periaortic inflammatory findings. Reproductive: Unremarkable Other: No supplemental non-categorized findings. Musculoskeletal: Unremarkable IMPRESSION: 1. New airspace opacity in the left lower lobe suspicious for pneumonia. 2. There are a few mildly dilated loops of small bowel with scattered air-fluid levels but without an obvious transition point or site of obstruction. Ileus is favored over low-grade partial small bowel obstruction. 3. Diffuse hepatic steatosis. 4. Air fluid level in the distal esophagus probably from reflux. 5. Small caliber abdominal aorta, only about 0.9 cm in diameter, without periaortic inflammatory findings. 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 Consider follow up vascular surgical consultation. Electronically Signed   By: Van Clines M.D.   On: Jan 06, 2019 19:15   Dg Chest Port 1 View  Result Date: January 06, 2019 CLINICAL DATA:  Sepsis. EXAM: PORTABLE CHEST 1 VIEW COMPARISON:  11/21/2018 FINDINGS: A very poor inspiration is again demonstrated. The heart remains grossly normal in size. Interval minimal patchy and linear density in the left lower lobe. Clear right lung. Mild peribronchial thickening. Unremarkable bones. IMPRESSION: 1. Interval minimal patchy and linear atelectasis or pneumonia in the left lower lobe. 2. Mild bronchitic changes. 3. Stable very poor inspiration. Electronically Signed   By: Claudie Revering M.D.   On: January 06, 2019 15:47   Dg Abd Portable 1v  Result Date: January 06, 2019 CLINICAL DATA:  Sepsis. Fever. EXAM: PORTABLE ABDOMEN - 1 VIEW COMPARISON:  11/23/2018 and abdomen and pelvis CT dated  11/24/2018. FINDINGS: Interval multiple mildly dilated loops of proximal small bowel with gas-filled normal caliber distal small bowel and colon. Unremarkable bones. IMPRESSION: Interval mild small bowel ileus or partial obstruction. No gross free air seen at this time. Electronically Signed   By: Claudie Revering M.D.   On: 2019-01-06 15:49    EKG: Independently reviewed.  Assessment/Plan Principal Problem:   Severe sepsis (HCC) Active Problems:   Autistic disorder   Intractable nausea and vomiting   Seizure disorder (HCC)   Constipation in male   Obesity, Class III, BMI 40-49.9 (morbid obesity) (HCC)   Ileus (HCC)   Aspiration pneumonia (HCC)   Assessment and plan  Acute encephalopathy Secondary to severe sepsis  Change in mental status patient very lethargic Patient with history of seizures, on Ativan as needed CT scan of the head negative Plan IV fluids antibiotics  Patient with severe sepsis criteria secondary to aspiration pneumonia By old criteria he is in septic shock Blood pressure came back to normal after IV fluids 30 cc/kg no need of vasopressors Elevated lactic acid 4.7 Patient probably aspirated  Plan blood cultures, urine cultures, cefepime Flagyl vancomycin, serial lactic acid, IV fluids  Aspiration pneumonia Airspace opacity in the left lower lobe suspicion for pneumonia, usually aspiration from the right Patient with multiple episodes of vomiting the last week Plan continue with cefepime Flagyl and vancomycin await for blood cultures  Ileus versus partial small bowel obstruction 2. There are a few mildly dilated loops of small bowel with scattered air-fluid levels but without an obvious transition point or site of obstruction. Ileus is favored over low-grade partial small bowel obstruction. 3. Diffuse hepatic steatosis. 4. Air fluid level in the distal esophagus probably from reflux. 5. Small caliber abdominal aorta, only about 0.9 cm in diameter, without  periaortic inflammatory findings. 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 Consider follow up vascular surgical consultation. Plan NG tube, n.p.o., surgery consult, consider vascular surgical consultation for mid aortic syndrome  Reflux esophagitis PPI  History of seizures Ativan as needed  Hyperglycemia Insulin sliding scale per sensitivity factor  Morbid obesity BMI more than 40 Encouraged to lose weight  Autistic disorders  DVT prophylaxis: SCD patient with elevated INR Code Status: Full code patient under hospice Family Communication: Yes his mother in the room Disposition Plan: Discharge skilled nursing home Consults called: Unable to contact the intensivist Admission status: Full admission   Victorino Sparrow Jerid Catherman MD [facility]  If 7PM-7AM, please contact night-coverage [email or web address]   12/09/2018, 5:09 AM

## 2018-12-09 NOTE — Significant Event (Signed)
Rapid Response Event Note  Overview: Time Called: 0748 Arrival Time: 0752 Event Type: MEWS  Initial Focused Assessment: Patient admitted last night from ED treating for sepsis/asp pneumonia and partial small bowel obstruction Per family member he is non verbal at baseline. He currently is "awake" and helps a little when we turned him. He is hot to the touch per RN he has a fever.  MEWS 6 BP 129/74  ST 140s  RR 26- 30  O2 sat 95% on RA Per records/report his VS have been similar since midnight.   He is breathing easily no increased WOB He looks comfortable.  Lactic Acid 4.7 at 0114,  His lactic acid has ranged from 5.4-4.1 since he arrived.  Per nurse he has had 6L fluid bolus and is currently on NS at 125cc/hr  Interventions: Tylenol given PR RED MEWS guidelines:   MEWS Guidelines (patients age 85 and over) Scroll down to see additional guidelines  Red - At High Risk for Deterioration  1. Go to room and assess patient 2. Validate data. Is this patient's baseline? If data confirmed: 3. Is this an acute change? 4. Administer prn meds/treatments as ordered. 5. Note Sepsis score 6. Review goals of care 7. Sports coach, RRT nurse and Provider. 8. Ask Provider to come to bedside.  9. Document patient condition/interventions/response. 10. Increase frequency of vital signs and focused assessments to at least q15 minutes x 4, then q30 minutes x2. - If stable, then q1h x3, then q4h x3 and then q8h or dept. routine. - If unstable, contact Provider & RRT nurse. Prepare for possible transfer. 11. Add entry in progress notes using the smart phrase ".MEWS".                   Plan of Care (if not transferred): RN to call if patient becomes distressed, difficult to arouse  Increased WOB or hypotensive.  Event Summary:   at      at          Raliegh Ip

## 2018-12-09 NOTE — Progress Notes (Addendum)
HPI: Aaron Vega is a 23 y.o. male with medical history significant of seizures, obesity hypertension constipation and severe outages. Patient was able to have almost a normal life except without his till he has a seizures in August. 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 Since then his health had a progressive decline. Patient currently is under hospice care..  For the last 2 days patient was more lethargic, had ongoing vomiting which is are not uncommon for him because he has history of constipation over the last few months.  He is not urinating.  He has discomfort with urination/ the hospice nurse thought that he has some crackles in his lungs and he was sent here for further evaluation.  Patient is nonverbal and currently follows some commands ED Course:  In the emergency room he was found to be septic With left lower lobe infiltrate Elevated lactic acid 4.5 CT abdomen and pelvis small bowel obstruction versus ileus with suspicion of mid aortic syndrome. Patient was started to call on cefepime Flagyl and vancomycin   Assessment and plan:  Chart reviewed. Cont current plan   - NG tube was not placed properly. Nurse re-attempted but failed. Spoke with IR on call but suggested fluro radio doest it. Placed a stat radio consult for NG tube place.  - Spoke with Dr. Donne Hazel. Suggested to give some CLD and rectal suppository. If Pt develops N/V or distention. Stop the diet. Place the NG tube. And call surgery then.  - Spoke with Vascular Surgeon, Dr. Carlis Abbott. Went over the CT-Ab. Doesn't think it's midaortic syndrome. More likely natural for this Pt. No intervention or follow-up indicated.  - Placed the orders.  - Appreciate Surgery recs - Echo for persistent tachy cardia  - Ekg   Acute encephalopathy Secondary to severe sepsis  Change in mental status patient very lethargic Patient with history of seizures, on Ativan as needed CT scan of the head negative Plan IV fluids  antibiotics  Patient with severe sepsis criteria secondary to aspiration pneumonia By old criteria he is in septic shock Blood pressure came back to normal after IV fluids 30 cc/kg no need of vasopressors Elevated lactic acid 4.7 Patient probably aspirated  Plan blood cultures, urine cultures, cefepime Flagyl vancomycin, serial lactic acid, IV fluids  Aspiration pneumonia Airspace opacity in the left lower lobe suspicion for pneumonia, usually aspiration from the right Patient with multiple episodes of vomiting the last week Plan continue with cefepime Flagyl and vancomycin await for blood cultures  Ileus versus partial small bowel obstruction 2. There are a few mildly dilated loops of small bowel with scattered air-fluid levels but without an obvious transition point or site of obstruction. Ileus is favored over low-grade partial small bowel obstruction. 3. Diffuse hepatic steatosis. 4. Air fluid level in the distal esophagus probably from reflux. 5. Small caliber abdominal aorta, only about 0.9 cm in diameter, without periaortic inflammatory findings. 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 Consider follow up vascular surgical consultation. Plan NG tube, n.p.o., surgery consult, consider vascular surgical consultation for mid aortic syndrome  Reflux esophagitis PPI  History of seizures Ativan as needed  Hyperglycemia Insulin sliding scale per sensitivity factor  Morbid obesity BMI more than 40 Encouraged to lose weight  Autistic disorders

## 2018-12-09 NOTE — Progress Notes (Signed)
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 Patient has had recurrent hospitalizations at [facility] in last 3 months for GI symptoms-nausea/vomiting/constipation. Per mother patient was tapered off Vimpat by [facility] last week and transitioned to [facility].  ED Course: In the emergency room he was non verbal, following some commands,found to be septic with left lower lobe infiltrate,elevated lactic acid 4.5. CT head negative.CT abdomen and pelvis showed SBO versus ileus with suspicion of mid aortic syndrome (felt to be non significant by vascular sx). NGT placement unsuccessful in ED. Patient was started on abx- cefepime Flagyl and vancomycin.  Hospital course: Admitted by [facility] with abx for pna/aspiration. Gen sx on admission recommended CLD, rectal suppository to Rx constripation and NGT if has abdominal distension . Echo ordered for tachycardia. Blood cx pending. Full code per record.   Subjective: Called by nurse this morning that patient is scheduled for NG tube placement through IR but MEWS score elevated all night and lactate level this morning came back at 7.0.  On my arrival, patient noted to be tachycardic (sinus tach at heart rate 130s), blood pressure 120/60, patient febrile at 101.9 and tachypneic, appears to have upper airway secretions and coarse breath sounds.  Patient did turn towards me on calling his name.  Follows some commands.  Appears lethargic.  Mother at bedside and stated patient has had worsening upper airway secretions since he had  clear liquid diet last night.  She also reports on and off dysphagia while eating at home as well.  She states patient was referred for home hospice services through his pediatrician Dr. Victory Dakin last week given overall poor prognosis/decline.  Per bedside nurse, patient has had no response to suppositories x2 but has been passing gas.   Objective: Vitals:   12/10/18 0515 12/10/18 0600 12/10/18 0700 12/10/18 0800  BP: (!) 145/70 (!) 148/80 131/89 124/87  Pulse:  (!) 130 (!) 130 (!) 134  Resp: 20 (!) 24 (!) 33 20  Temp:  (!) 101.3 F (38.5 C) 100.3 F (37.9 C) (!) 101.9 F (38.8 C)  TempSrc:  Axillary Axillary Axillary  SpO2: 98% 97%    Weight:      Height:        Intake/Output Summary (Last 24 hours) at 12/10/2018 0823 Last data filed at 12/10/2018 0537 Gross per 24 hour  Intake 4500 ml  Output 2900 ml  Net 1600 ml   Filed Weights   2019-01-02 1402 2019/01/02 1418  Weight: (!) 148.8 kg 135.2 kg    Physical Examination:  General exam: Appears tachypneic, lethargic Respiratory system: Decreased breath sounds at bases, upper airway secretions/rhonchi.  Respiratory effort somewhat increased Cardiovascular system: S1 & S2 heard, RRR, tachycardic. No JVD, murmurs, rubs, gallops or clicks. No pedal edema. Gastrointestinal system: Abdomen is nondistended, soft and nontender. No organomegaly or masses felt. Normal bowel sounds heard. Central nervous system: Awake but appears tired/lethargic, cannot test orientation as patient nonverbal but responds to his name.  Follows some commands, moves both upper extremities Skin: No rashes, lesions or ulcers Psychiatry: Judgement and insight impaired at baseline with autism    Data Reviewed: I have personally reviewed following labs  and imaging studies  CBC: Recent Labs  Lab 12/17/2018 1425 12/09/18 0114 12/10/18 0250  WBC 9.1 5.6 4.6  NEUTROABS 6.4 3.9 2.8  HGB 14.0 12.4* 10.9*  HCT 42.3 37.3* 33.3*  MCV 82.1 82.9 84.5  PLT 603* 358 331     Basic Metabolic Panel: Recent Labs  Lab 12/20/2018 1425 12/09/18 0114 12/10/18 0250  NA 132* 135 137  K 3.1* 3.5 4.0  CL 88* 98 104  CO2 23 23 16*  GLUCOSE 137* 107* 90  BUN 19 14 10   CREATININE 1.18 0.56* 0.71  CALCIUM 9.5 8.5* 8.4*   GFR: Estimated Creatinine Clearance: 205.2 mL/min (by C-G formula based on SCr of 0.71 mg/dL). Liver Function Tests: Recent Labs  Lab 12/21/2018 1425 12/09/18 0114 12/10/18 0250  AST 93* 71* 69*  ALT 159* 124* 109*  ALKPHOS 49 39 35*  BILITOT 1.1 1.3* 0.8  PROT 6.5 5.6* 5.4*  ALBUMIN 3.5 2.9* 2.5*   No results for input(s): LIPASE, AMYLASE in the last 168 hours. No results for input(s): AMMONIA in the last 168 hours. Coagulation Profile: Recent Labs  Lab 12/21/2018 1425 12/09/18 0114 12/09/18 2252  INR 2.9* 2.8* 3.1*   Cardiac Enzymes: No results for input(s): CKTOTAL, CKMB, CKMBINDEX, TROPONINI in the last 168 hours. BNP (last 3 results) No results for input(s): PROBNP in the last 8760 hours. HbA1C: No results for input(s): HGBA1C in the last 72 hours. CBG: Recent Labs  Lab 12/09/18 1122  GLUCAP 99   Lipid Profile: No results for input(s): CHOL, HDL, LDLCALC, TRIG, CHOLHDL, LDLDIRECT in the last 72 hours. Thyroid Function Tests: No results for input(s): TSH, T4TOTAL, FREET4, T3FREE, THYROIDAB in the last 72 hours. Anemia Panel: No results for input(s): VITAMINB12, FOLATE, FERRITIN, TIBC, IRON, RETICCTPCT in the last 72 hours. Sepsis Labs: Recent Labs  Lab 12/09/18 0114 12/09/18 1939 12/09/18 2202 12/10/18 0245 12/10/18 0545  PROCALCITON 0.36  --   --   --   --   LATICACIDVEN 4.7* 7.2* 7.5* 8.1* 8.1*    Recent Results (from the past 240 hour(s))  Urine culture     Status: None   Collection Time: 12/06/2018  2:11 PM   Specimen: In/Out Cath Urine  Result Value Ref Range Status   Specimen Description IN/OUT CATH URINE  Final   Special Requests NONE  Final   Culture   Final    NO GROWTH Performed at [facility], [address] [place], [place]    Report Status 12/09/2018 FINAL  Final  Blood Culture (routine x 2)     Status: None (Preliminary result)   Collection Time: 12/06/2018  3:00 PM   Specimen: BLOOD RIGHT HAND  Result Value Ref Range Status   Specimen Description BLOOD RIGHT HAND  Final   Special Requests   Final    BOTTLES DRAWN AEROBIC ONLY Blood Culture results may not be optimal due to an inadequate volume of blood received in culture bottles   Culture   Final    NO GROWTH < 24 HOURS Performed at [facility], [address] [place], [place]    Report Status PENDING  Incomplete  Blood Culture (routine x 2)     Status: None (Preliminary result)   Collection Time: 11/26/2018  3:08 PM   Specimen: BLOOD  Result Value Ref Range Status   Specimen Description BLOOD RIGHT ANTECUBITAL  Final   Special Requests   Final    BOTTLES DRAWN AEROBIC AND ANAEROBIC  Blood Culture results may not be optimal due to an inadequate volume of blood received in culture bottles   Culture   Final    NO GROWTH < 24 HOURS Performed at [facility], [address] [place], [place] [address]    Report Status PENDING  Incomplete  SARS CORONAVIRUS 2 (TAT 6-24 HRS) Nasopharyngeal Nasopharyngeal Swab     Status: None   Collection Time: 11/25/2018  4:06 PM   Specimen: Nasopharyngeal Swab  Result Value Ref Range Status   SARS Coronavirus 2 NEGATIVE NEGATIVE Final    Comment: (NOTE) SARS-CoV-2 target nucleic acids are NOT DETECTED. The SARS-CoV-2 RNA is generally detectable in upper and lower respiratory specimens during the acute phase of infection. Negative results do not preclude SARS-CoV-2 infection, do not rule out co-infections with other pathogens, and should not be used as the sole basis for treatment or other patient management decisions. Negative results must be combined with clinical observations, patient history, and epidemiological information. The  expected result is Negative. Fact Sheet for Patients: HairSlick.no Fact Sheet for Healthcare Providers: quierodirigir.com This test is not yet approved or cleared by the [place] FDA and  has been authorized for detection and/or diagnosis of SARS-CoV-2 by FDA under an Emergency Use Authorization (EUA). This EUA will remain  in effect (meaning this test can be used) for the duration of the COVID-19 declaration under Section 56 4(b)(1) of the Act, 21 U.S.C. section 360bbb-3(b)(1), unless the authorization is terminated or revoked sooner. Performed at [facility], [address], [place], [place] [address]       Radiology Studies: Ct Head Wo Contrast  Result Date: 12/10/2018 CLINICAL DATA:  Altered level of consciousness EXAM: CT HEAD WITHOUT CONTRAST TECHNIQUE: Contiguous axial images were obtained from the base of the skull through the vertex without intravenous contrast. COMPARISON:  11/03/2018 FINDINGS: Brain: The brainstem, cerebellum, cerebral peduncles, thalami, basal ganglia, basilar cisterns, and ventricular system appear within normal limits. No intracranial hemorrhage, mass lesion, or acute CVA. Vascular: Unremarkable Skull: Unremarkable Sinuses/Orbits: Mild chronic right frontal sinusitis. Other: No supplemental non-categorized findings. IMPRESSION: 1. No significant intracranial abnormality is observed. 2. Mild chronic right frontal sinusitis. Electronically Signed   By: Gaylyn Rong M.D.   On: 12/09/2018 19:02   Ct Abdomen Pelvis W Contrast  Result Date: 12/06/2018 CLINICAL DATA:  Abdominal pain. Shortness of breath. EXAM: CT ABDOMEN AND PELVIS WITH CONTRAST TECHNIQUE: Multidetector CT imaging of the abdomen and pelvis was performed using the standard protocol following bolus administration of intravenous contrast. CONTRAST:  OMNIPAQUE IOHEXOL 350 MG/ML SOLN COMPARISON:  11/24/2018 FINDINGS: Lower  chest: New airspace opacity in the left lower lobe suspicious for pneumonia. Air fluid level in the distal esophagus probably from reflux. Hepatobiliary: Diffuse hepatic steatosis is suspected. Otherwise unremarkable. Pancreas: Unremarkable Spleen: Unremarkable Adrenals/Urinary Tract: Unremarkable Stomach/Bowel: There are a few mildly dilated loops of small bowel measuring up to 3.5 cm in diameter, with scattered air-fluid levels but without an obvious transition point or site of obstruction. Vascular/Lymphatic: Borderline prominent right gastric node 0.9 cm in short axis on image 24/3, previously 0.8 cm. Patient has an unusually small in caliber abdominal aorta, measuring 0.9 cm in diameter. Common origin of the celiac trunk and SMA. No obvious periaortic inflammatory findings. Reproductive: Unremarkable Other: No supplemental non-categorized findings. Musculoskeletal: Unremarkable IMPRESSION: 1. New airspace opacity in the left lower lobe suspicious for pneumonia. 2. There are a few mildly dilated loops of small bowel with scattered air-fluid  levels but without an obvious transition point or site of obstruction. Ileus is favored over low-grade partial small bowel obstruction. 3. Diffuse hepatic steatosis. 4. Air fluid level in the distal esophagus probably from reflux. 5. Small caliber abdominal aorta, only about 0.9 cm in diameter, without periaortic inflammatory findings. 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 Consider follow up vascular surgical consultation. Electronically Signed   By: Gaylyn RongWalter  Liebkemann M.D.   On: 12/18/2018 19:15   Dg Chest Port 1 View  Result Date: 12/09/2018 CLINICAL DATA:  Tachypnea, low-grade fever EXAM: PORTABLE CHEST 1 VIEW COMPARISON:  12/01/2018 FINDINGS: Low lung volumes. Heart size is accentuated by the low volumes. Bibasilar atelectasis. No effusions. No acute bony abnormality. IMPRESSION: Low lung volumes, bibasilar atelectasis. Electronically Signed   By: Charlett NoseKevin   Dover M.D.   On: 12/09/2018 21:38   Dg Chest Port 1 View  Result Date: 11/30/2018 CLINICAL DATA:  Sepsis. EXAM: PORTABLE CHEST 1 VIEW COMPARISON:  11/21/2018 FINDINGS: A very poor inspiration is again demonstrated. The heart remains grossly normal in size. Interval minimal patchy and linear density in the left lower lobe. Clear right lung. Mild peribronchial thickening. Unremarkable bones. IMPRESSION: 1. Interval minimal patchy and linear atelectasis or pneumonia in the left lower lobe. 2. Mild bronchitic changes. 3. Stable very poor inspiration. Electronically Signed   By: Beckie SaltsSteven  Reid M.D.   On: 12/11/2018 15:47   Dg Abd Portable 1v  Result Date: 12/09/2018 CLINICAL DATA:  Nasogastric tube placement. EXAM: PORTABLE ABDOMEN - 1 VIEW COMPARISON:  11/25/2018 FINDINGS: No nasogastric tube seen in the lower chest or upper abdomen. Multiple dilated small bowel loops with mild improvement. Gas is again demonstrated in normal caliber colon. Mild scoliosis. IMPRESSION: 1. No nasogastric tube seen in the lower chest or upper abdomen. 2. Mildly improved small bowel obstruction. Electronically Signed   By: Beckie SaltsSteven  Reid M.D.   On: 12/09/2018 11:40   Dg Abd Portable 1v  Result Date: 12/23/2018 CLINICAL DATA:  Sepsis. Fever. EXAM: PORTABLE ABDOMEN - 1 VIEW COMPARISON:  11/23/2018 and abdomen and pelvis CT dated 11/24/2018. FINDINGS: Interval multiple mildly dilated loops of proximal small bowel with gas-filled normal caliber distal small bowel and colon. Unremarkable bones. IMPRESSION: Interval mild small bowel ileus or partial obstruction. No gross free air seen at this time. Electronically Signed   By: Beckie SaltsSteven  Reid M.D.   On: 12/22/2018 15:49        Scheduled Meds:  cloBAZam  60 mg Oral BID   hydrocortisone sod succinate (SOLU-CORTEF) inj  50 mg Intravenous Q6H   nystatin   Topical Daily   sodium chloride flush  10-40 mL Intracatheter Q12H   sodium chloride flush  3 mL Intravenous Q12H    Continuous Infusions:  sodium chloride     sodium chloride 125 mL/hr at 12/10/18 0537   ceFEPime (MAXIPIME) IV 2 g (12/09/18 2352)   metronidazole 500 mg (12/09/18 2353)   vancomycin 1,250 mg (12/10/18 0600)    Assessment & Plan:    1.  Severe sepsis syndrome in the setting of partial SBO/ileus and dysphagia/aspiration:  Patient febrile, tachypneic, tachycardic and lactate level profoundly elevated at 8.0.  Patient also noted to be coagulopathic with INR 3.1.  Check D-dimer/fibrinogen?  DIC.Continue broad-spectrum antibiotics.  Reduce IV fluid rate 200/h and concern for possible fluid overload until chest x-ray/ABG/BNP results are available.Requested intensivist (Dr. Denese KillingsAgarwala) to evaluate patient for ICU transfer as high risk for rapid decline.  It appears the patient was started on IV hydrocortisone  on admission (50 mg every 6 hours) but never required pressors.  2.  Partial SBO/ileus: His abdomen is soft on exam.  PCCM reviewed admission CT and felt that patient still has moderate stool burden.  Since he has not had any significant response to suppositories, ordered enema x1.  Abdominal x-ray for follow-up being done now.  Will keep n.p.o. with IV fluids for now.  Will assess need for NG tube based on x-ray results  3.  Respiratory distress with acute hypoxic respiratory failure: Patient requiring 3 L of O2 via nasal cannula currently.  Will obtain ABG/chest x-ray as explained above.  He appears to have upper airway secretions.  Requested bedside deep oral suctioning and NT suction by RT.  Titrate O2 as needed to keep sats greater than 92%.  Patient on broad-spectrum antibiotics.  He does not have a white count but is febrile.  Added scheduled neb treatments in addition to as needed.  On IV steroids  4.  Dysphagia: Patient may or may not need NG tube for problem #2 but will likely need NG tube for feeding/meds.  Will need speech therapy evaluation when more stable.  Discussed with mother  regarding care goals especially since patient been on home hospice.  She wants aggressive measures at this time "what ever it takes to keep him alive".  She is open to palliative care discussions.  Consult placed.  5.  Sinus tachycardia: In the setting of problem #1.  Treat underlying cause.  Has required metoprolol IV x2 overnight.  6.  Abnormal liver profile: Secondary to problem #1 versus medication induced.  Mild elevation in ALT/AST noted.  Monitor for now.  7.  Seizure disorder: Patient recently tapered off Vimpat and transitioned to [place].  Will need to resume via NG tube as soon as possible.  8. 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  Since then he has progressively declined and has pretty much been bedbound.  9.  Reflux esophagitis: On PPI at home, add IV while NPO.  CT on admission did show air-fluid level in the lower esophagus suggestive of reflux.  DVT prophylaxis: Coagulopathy with INR at 3, SCDs Code Status: Full code as discussed with mother at bedside Family / Patient Communication: Discussed extensively with mother at bedside regarding overall poor prognosis and high risk of rapid decline.  She wishes to continue aggressive measures and full CODE STATUS for now.  Palliative care consult placed. Disposition Plan: Currently on progressive care unit.  Requested intensivist evaluation.  Ultimate disposition to be decided based on clinical course     LOS: 2 days    Critical care time spent: 45 minutes.    Guilford Shi, MD [facility] Pager [phone]  If 7PM-7AM, please contact night-coverage [email or web address] Password [facility] 12/10/2018, 8:23 AM

## 2018-12-09 NOTE — Progress Notes (Addendum)
   Vital Signs MEWS/VS Documentation      12/09/2018 1901 12/09/2018 1956 12/09/2018 2000 12/09/2018 2048   MEWS Score:  8  5  5  5    MEWS Score Color:  Red  Red  Red  Red   Resp:  -  (!) 28  (!) 29  -   Pulse:  -  (!) 135  (!) 136  -   BP:  -  (!) 142/83  (!) 142/78  -   Temp:  -  99.5 F (37.5 C)  -  98.9 F (37.2 C)   O2 Device:  -  Room Air  -  -     I was notified by the staff regarding climbing LA to 7.2 from 4.7. Only maintenance IVF were given. Pt with low grade temp, tachycardic and tachypneic with little to no increased WOB. Tracey RN notified C. Bodenheimer and orders received for 2L IVF bolus, PCXR, follow up LA. Will continue to follow.  Addendum: 0401-Call received regarding LA 8.1 after multiple IVF boluses tonight. Tracey RN notified C. Bodenheimer NP and orders received.      Madelynn Done 12/09/2018,9:04 PM

## 2018-12-09 NOTE — Progress Notes (Addendum)
Notified MD of lactic acid 4.7 and the following vitals:    12/09/18 0235  Vitals  Temp 99 F (37.2 C)  Temp Source Oral  BP 135/80  MAP (mmHg) 96  Pulse Rate (!) 143  ECG Heart Rate (!) 142  Resp (!) 24  Oxygen Therapy  SpO2 96 %  O2 Device Room Air   Orders received.

## 2018-12-09 NOTE — Progress Notes (Signed)
Placed NGT per MD order.  Patient tolerated well.

## 2018-12-09 NOTE — Progress Notes (Signed)
PT is not on prophylaxis for PE because of high INR high risk of PE/need repeat INR start on prophylaxis

## 2018-12-09 NOTE — Progress Notes (Signed)
Called rapid response to assess patient for MEWS of 6. MD made aware. Patient given tylenol suppository. IV team consult for additional access. Continuous pulse ox.

## 2018-12-10 ENCOUNTER — Inpatient Hospital Stay (HOSPITAL_COMMUNITY)

## 2018-12-10 ENCOUNTER — Ambulatory Visit: Payer: Medicaid Other | Admitting: Gastroenterology

## 2018-12-10 DIAGNOSIS — J690 Pneumonitis due to inhalation of food and vomit: Secondary | ICD-10-CM | POA: Diagnosis not present

## 2018-12-10 DIAGNOSIS — G6289 Other specified polyneuropathies: Secondary | ICD-10-CM

## 2018-12-10 DIAGNOSIS — G931 Anoxic brain damage, not elsewhere classified: Secondary | ICD-10-CM

## 2018-12-10 DIAGNOSIS — K5900 Constipation, unspecified: Secondary | ICD-10-CM | POA: Diagnosis not present

## 2018-12-10 DIAGNOSIS — J9601 Acute respiratory failure with hypoxia: Secondary | ICD-10-CM | POA: Diagnosis not present

## 2018-12-10 DIAGNOSIS — E512 Wernicke's encephalopathy: Secondary | ICD-10-CM

## 2018-12-10 DIAGNOSIS — F840 Autistic disorder: Secondary | ICD-10-CM | POA: Diagnosis not present

## 2018-12-10 DIAGNOSIS — A419 Sepsis, unspecified organism: Secondary | ICD-10-CM | POA: Diagnosis not present

## 2018-12-10 DIAGNOSIS — G40909 Epilepsy, unspecified, not intractable, without status epilepticus: Secondary | ICD-10-CM

## 2018-12-10 DIAGNOSIS — R4182 Altered mental status, unspecified: Secondary | ICD-10-CM

## 2018-12-10 LAB — CBC WITH DIFFERENTIAL/PLATELET
Abs Immature Granulocytes: 0.04 10*3/uL (ref 0.00–0.07)
Basophils Absolute: 0 10*3/uL (ref 0.0–0.1)
Basophils Relative: 0 %
Eosinophils Absolute: 0 10*3/uL (ref 0.0–0.5)
Eosinophils Relative: 0 %
HCT: 33.3 % — ABNORMAL LOW (ref 39.0–52.0)
Hemoglobin: 10.9 g/dL — ABNORMAL LOW (ref 13.0–17.0)
Immature Granulocytes: 1 %
Lymphocytes Relative: 31 %
Lymphs Abs: 1.4 10*3/uL (ref 0.7–4.0)
MCH: 27.7 pg (ref 26.0–34.0)
MCHC: 32.7 g/dL (ref 30.0–36.0)
MCV: 84.5 fL (ref 80.0–100.0)
Monocytes Absolute: 0.4 10*3/uL (ref 0.1–1.0)
Monocytes Relative: 9 %
Neutro Abs: 2.8 10*3/uL (ref 1.7–7.7)
Neutrophils Relative %: 59 %
Platelets: 331 10*3/uL (ref 150–400)
RBC: 3.94 MIL/uL — ABNORMAL LOW (ref 4.22–5.81)
RDW: 13.1 % (ref 11.5–15.5)
WBC: 4.6 10*3/uL (ref 4.0–10.5)
nRBC: 0 % (ref 0.0–0.2)

## 2018-12-10 LAB — BLOOD GAS, ARTERIAL
Acid-base deficit: 7.3 mmol/L — ABNORMAL HIGH (ref 0.0–2.0)
Bicarbonate: 16.8 mmol/L — ABNORMAL LOW (ref 20.0–28.0)
Drawn by: 53527
FIO2: 32
O2 Saturation: 97.2 %
Patient temperature: 38.8
pCO2 arterial: 31.7 mmHg — ABNORMAL LOW (ref 32.0–48.0)
pH, Arterial: 7.355 (ref 7.350–7.450)
pO2, Arterial: 97.5 mmHg (ref 83.0–108.0)

## 2018-12-10 LAB — COMPREHENSIVE METABOLIC PANEL
ALT: 109 U/L — ABNORMAL HIGH (ref 0–44)
AST: 69 U/L — ABNORMAL HIGH (ref 15–41)
Albumin: 2.5 g/dL — ABNORMAL LOW (ref 3.5–5.0)
Alkaline Phosphatase: 35 U/L — ABNORMAL LOW (ref 38–126)
Anion gap: 17 — ABNORMAL HIGH (ref 5–15)
BUN: 10 mg/dL (ref 6–20)
CO2: 16 mmol/L — ABNORMAL LOW (ref 22–32)
Calcium: 8.4 mg/dL — ABNORMAL LOW (ref 8.9–10.3)
Chloride: 104 mmol/L (ref 98–111)
Creatinine, Ser: 0.71 mg/dL (ref 0.61–1.24)
GFR calc Af Amer: >60 mL/min (ref >60)
GFR calc non Af Amer: >60 mL/min (ref >60)
Glucose, Bld: 90 mg/dL (ref 70–99)
Potassium: 4 mmol/L (ref 3.5–5.1)
Sodium: 137 mmol/L (ref 135–145)
Total Bilirubin: 0.8 mg/dL (ref 0.3–1.2)
Total Protein: 5.4 g/dL — ABNORMAL LOW (ref 6.5–8.1)

## 2018-12-10 LAB — VITAMIN B12: Vitamin B-12: 159 pg/mL — ABNORMAL LOW (ref 180–914)

## 2018-12-10 LAB — LACTIC ACID, PLASMA
Lactic Acid, Venous: 7.5 mmol/L (ref 0.5–1.9)
Lactic Acid, Venous: 8.1 mmol/L (ref 0.5–1.9)
Lactic Acid, Venous: 8.1 mmol/L (ref 0.5–1.9)
Lactic Acid, Venous: 7.2 mmol/L (ref 0.5–1.9)
Lactic Acid, Venous: 5.3 mmol/L (ref 0.5–1.9)

## 2018-12-10 LAB — FIBRINOGEN: Fibrinogen: 368 mg/dL (ref 210–475)

## 2018-12-10 LAB — PROTIME-INR
INR: 3.1 — ABNORMAL HIGH (ref 0.8–1.2)
Prothrombin Time: 31.4 seconds — ABNORMAL HIGH (ref 11.4–15.2)

## 2018-12-10 LAB — GLUCOSE, CAPILLARY
Glucose-Capillary: 75 mg/dL (ref 70–99)
Glucose-Capillary: 87 mg/dL (ref 70–99)

## 2018-12-10 LAB — D-DIMER, QUANTITATIVE: D-Dimer, Quant: <0.27 ug/mL-FEU (ref 0.00–0.50)

## 2018-12-10 LAB — BRAIN NATRIURETIC PEPTIDE: B Natriuretic Peptide: 67.3 pg/mL (ref 0.0–100.0)

## 2018-12-10 LAB — MRSA PCR SCREENING: MRSA by PCR: NEGATIVE

## 2018-12-10 IMAGING — MR MR HEAD WO/W CM
13 of 20 series · 28 of 48 positions shown · IV contrast (cc GV)
Comparison: Head CT [DATE]

CLINICAL DATA: Slowly progressive ataxia.  History of seizures.

EXAM:
MRI HEAD WITHOUT AND WITH CONTRAST
TECHNIQUE: Multiplanar, multiecho pulse sequences of the brain and surrounding
structures were obtained without and with intravenous contrast.
CONTRAST:  10mL GADAVIST GADOBUTROL 1 MMOL/ML IV SOLN

[Series 2: DWI · axial · 3.0mm · 0.94mm/px · z∈[-79,+83]mm · 5 of 110 slices shown (1 of 2)]
[im 1/110]
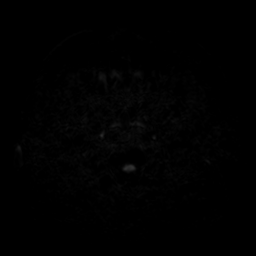
[im 28/110]
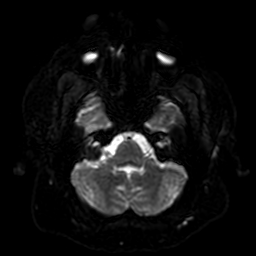
[im 55/110]
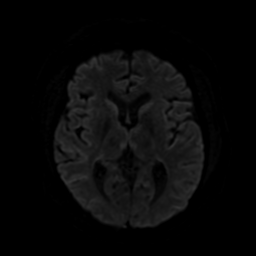
[im 82/110]
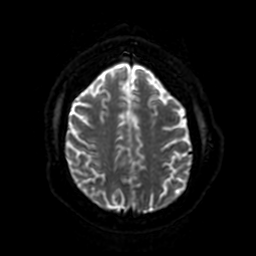
[im 110/110]
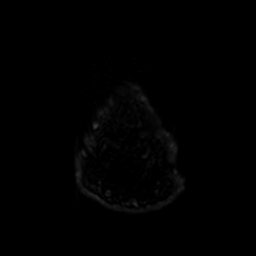

[Series 3: DWI · coronal · 4.0mm · 0.94mm/px · 3 of 72 slices shown (2 of 2)]
[im 1/72]
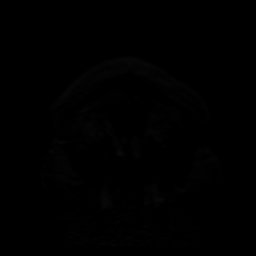
[im 36/72]
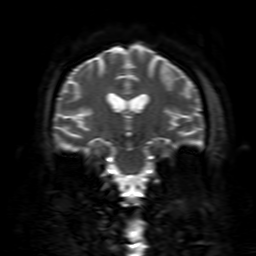
[im 72/72]
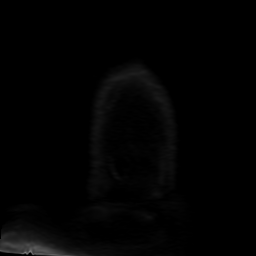

[Series 4: FLAIR · axial · 3.0mm · 0.45mm/px · 1 of 26 slices shown (1 of 3)]
[im 1/26]
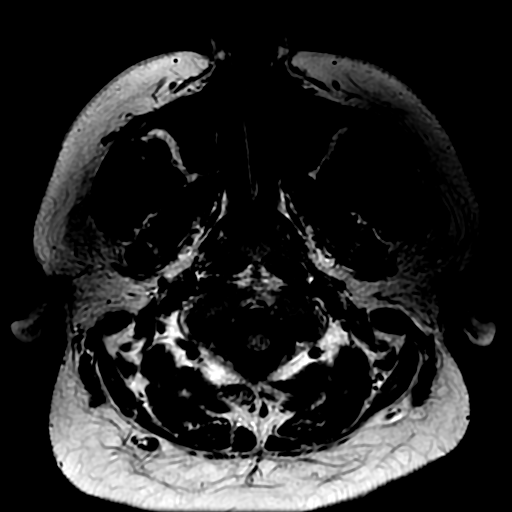

[Series 5: SWI · axial · 3.0mm · 0.47mm/px · z∈[-67,+24]mm · 4 of 104 slices shown]
[im 1/104]
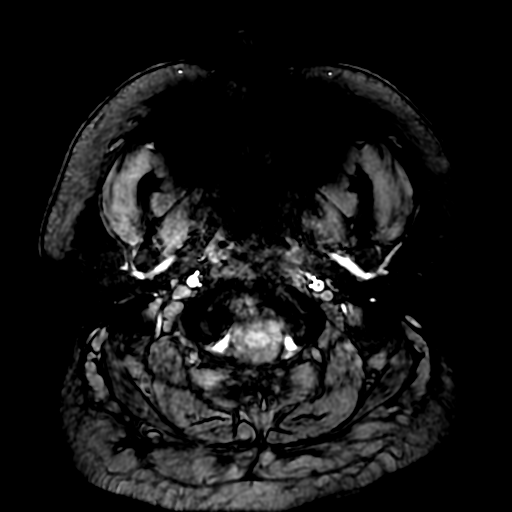
[im 21/104]
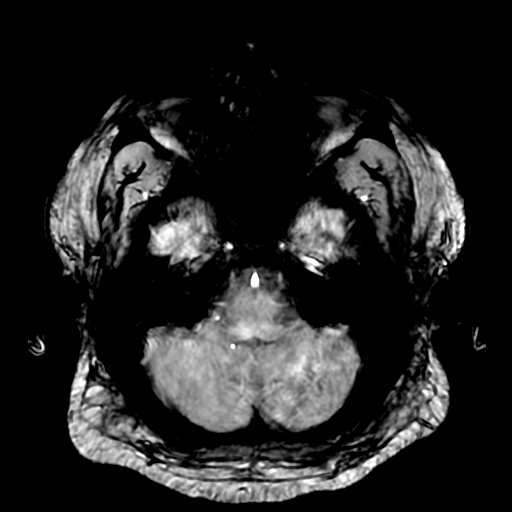
[im 42/104]
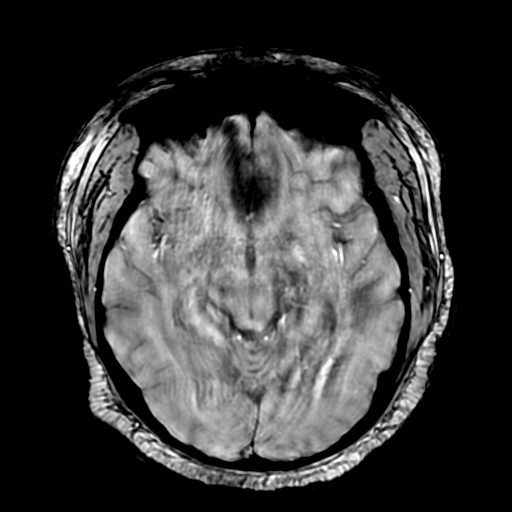
[im 62/104]
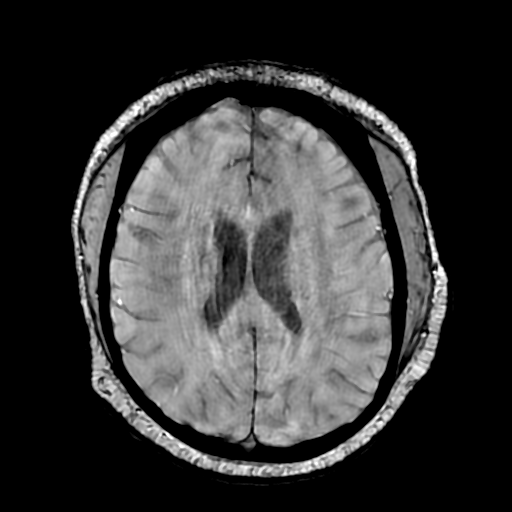

[Series 7: FLAIR · sagittal · 5.0mm · 0.47mm/px · 1 of 25 slices shown (2 of 3)]
[im 1/25]
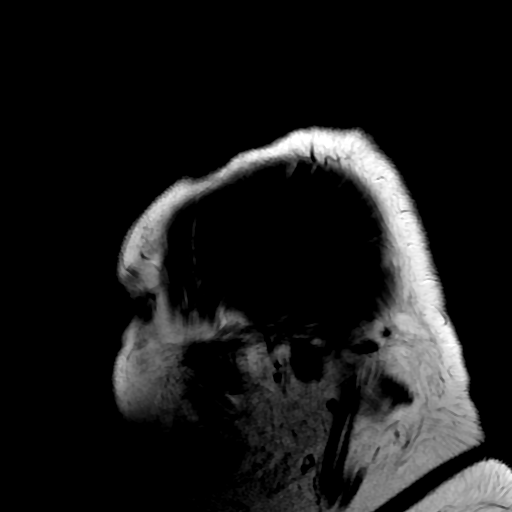

[Series 8: T2 · axial · 5.0mm · 0.45mm/px · 1 of 26 slices shown (1 of 3)]
[im 1/26]
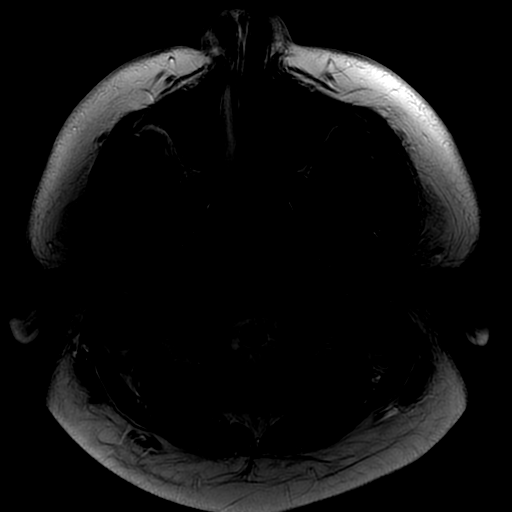

[Series 10: T2 · sagittal · 3.0mm · 0.43mm/px · 1 of 16 slices shown (2 of 3)]
[im 1/16]
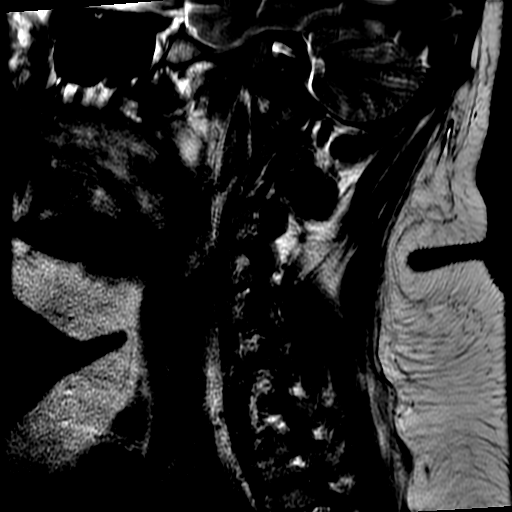

[Series 11: FLAIR · sagittal · 3.0mm · 0.43mm/px · 1 of 16 slices shown (3 of 3)]
[im 1/16]
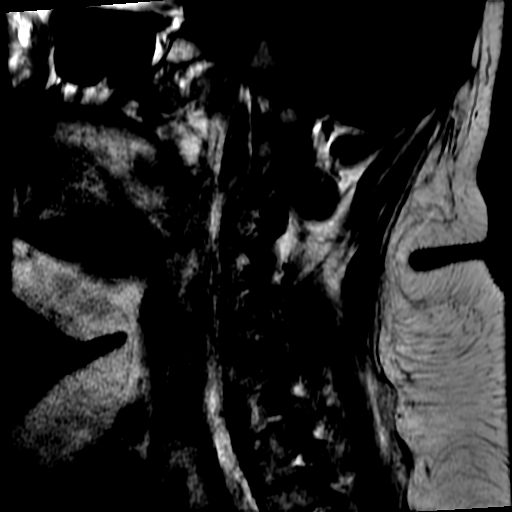

[Series 15: T2 · axial · 3.0mm · 0.35mm/px · z∈[-254,-145]mm · 2 of 30 slices shown (3 of 3)]
[im 1/30]
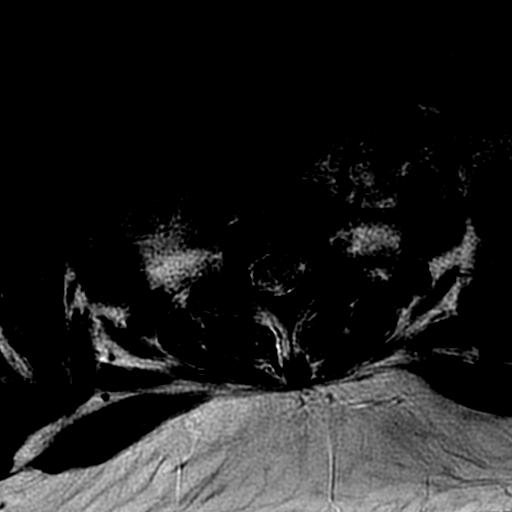
[im 30/30]
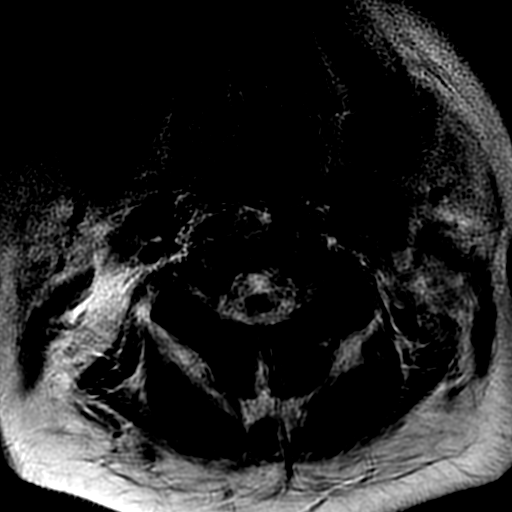

[Series 17: T1 post-contrast · axial · 3.0mm · 0.35mm/px · z∈[-254,-145]mm · 2 of 30 slices shown]
[im 1/30]
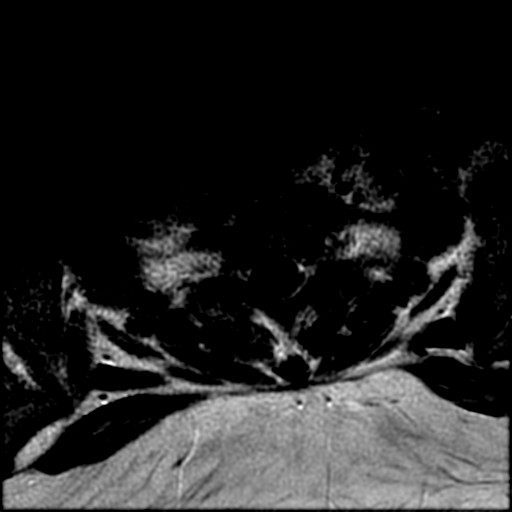
[im 30/30]
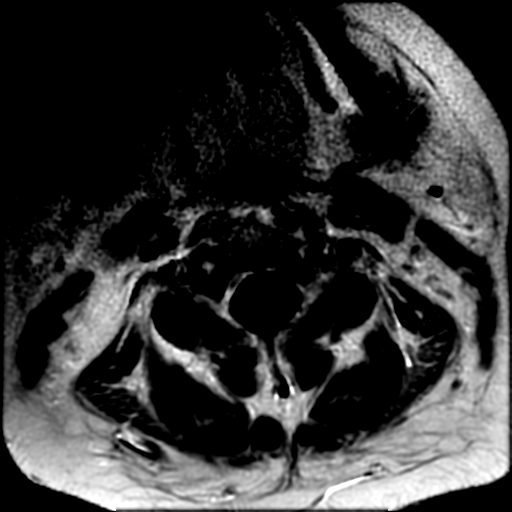

[Series 21: T2 post-contrast · coronal · 5.0mm · 0.47mm/px · 2 of 30 slices shown]
[im 1/30]
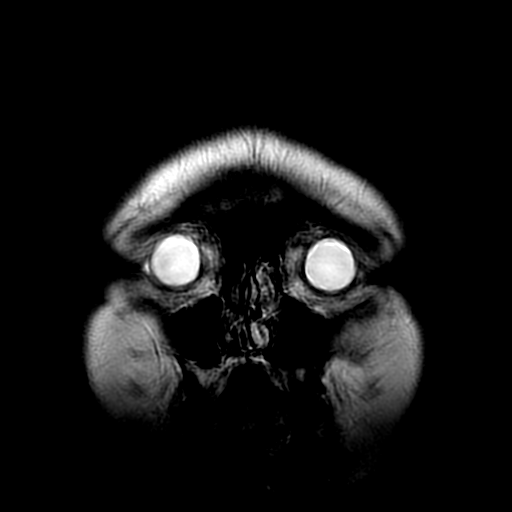
[im 30/30]
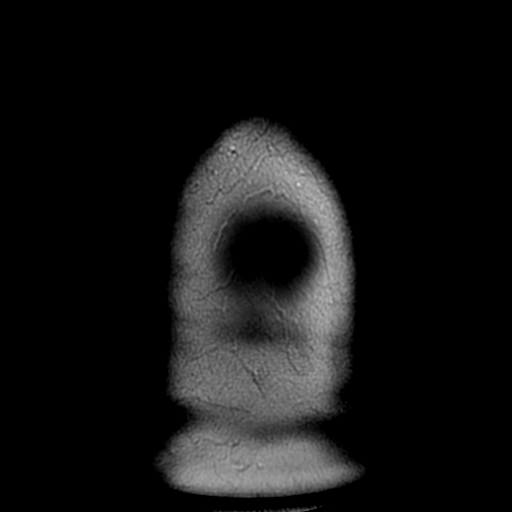

[Series 250: ADC · axial · 3.0mm · 0.94mm/px · z∈[-79,+83]mm · 3 of 55 slices shown (1 of 2)]
[im 1/55]
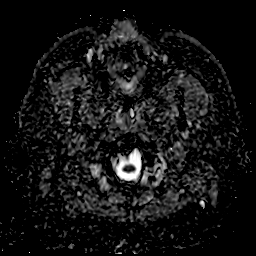
[im 28/55]
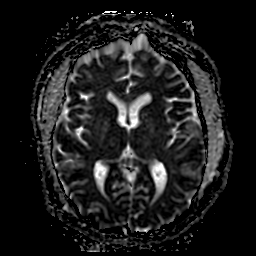
[im 55/55]
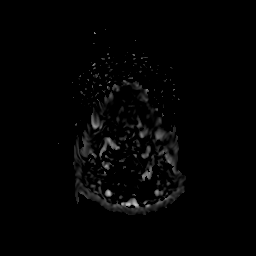

[Series 350: ADC · coronal · 4.0mm · 0.94mm/px · 2 of 36 slices shown (2 of 2)]
[im 1/36]
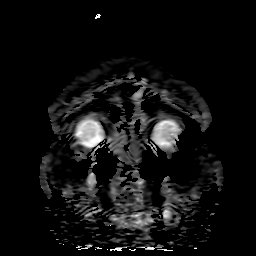
[im 36/36]
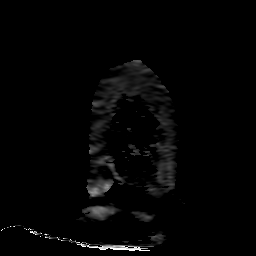

[28 of 48 positions shown; findings below may reference images not displayed]

FINDINGS: Brain: There is considerable motion degradation. There is abnormal
T2 signal and restricted diffusion in both medial thalami. The most
likely explanation is Wernicke encephalopathy. The remainder of the
brain appears normal. No evidence of ischemic infarction, mass
lesion, hemorrhage, hydrocephalus or extra-axial collection. No
abnormal contrast enhancement is demonstrated.

Vascular: Major vessels at the base of the brain show flow.

Skull and upper cervical spine: Negative

Sinuses/Orbits: Clear/normal

Other: None
IMPRESSION: Abnormal T2 signal and restricted diffusion in both medial thalami.
Most likely diagnosis is Wernicke encephalopathy.

## 2018-12-10 IMAGING — DX DG CHEST 1V PORT
1 series · 1 of 1 positions shown · non-contrast
Comparison: [DATE].

CLINICAL DATA: Dyspnea.

EXAM:
PORTABLE CHEST 1 VIEW

[chest ap]
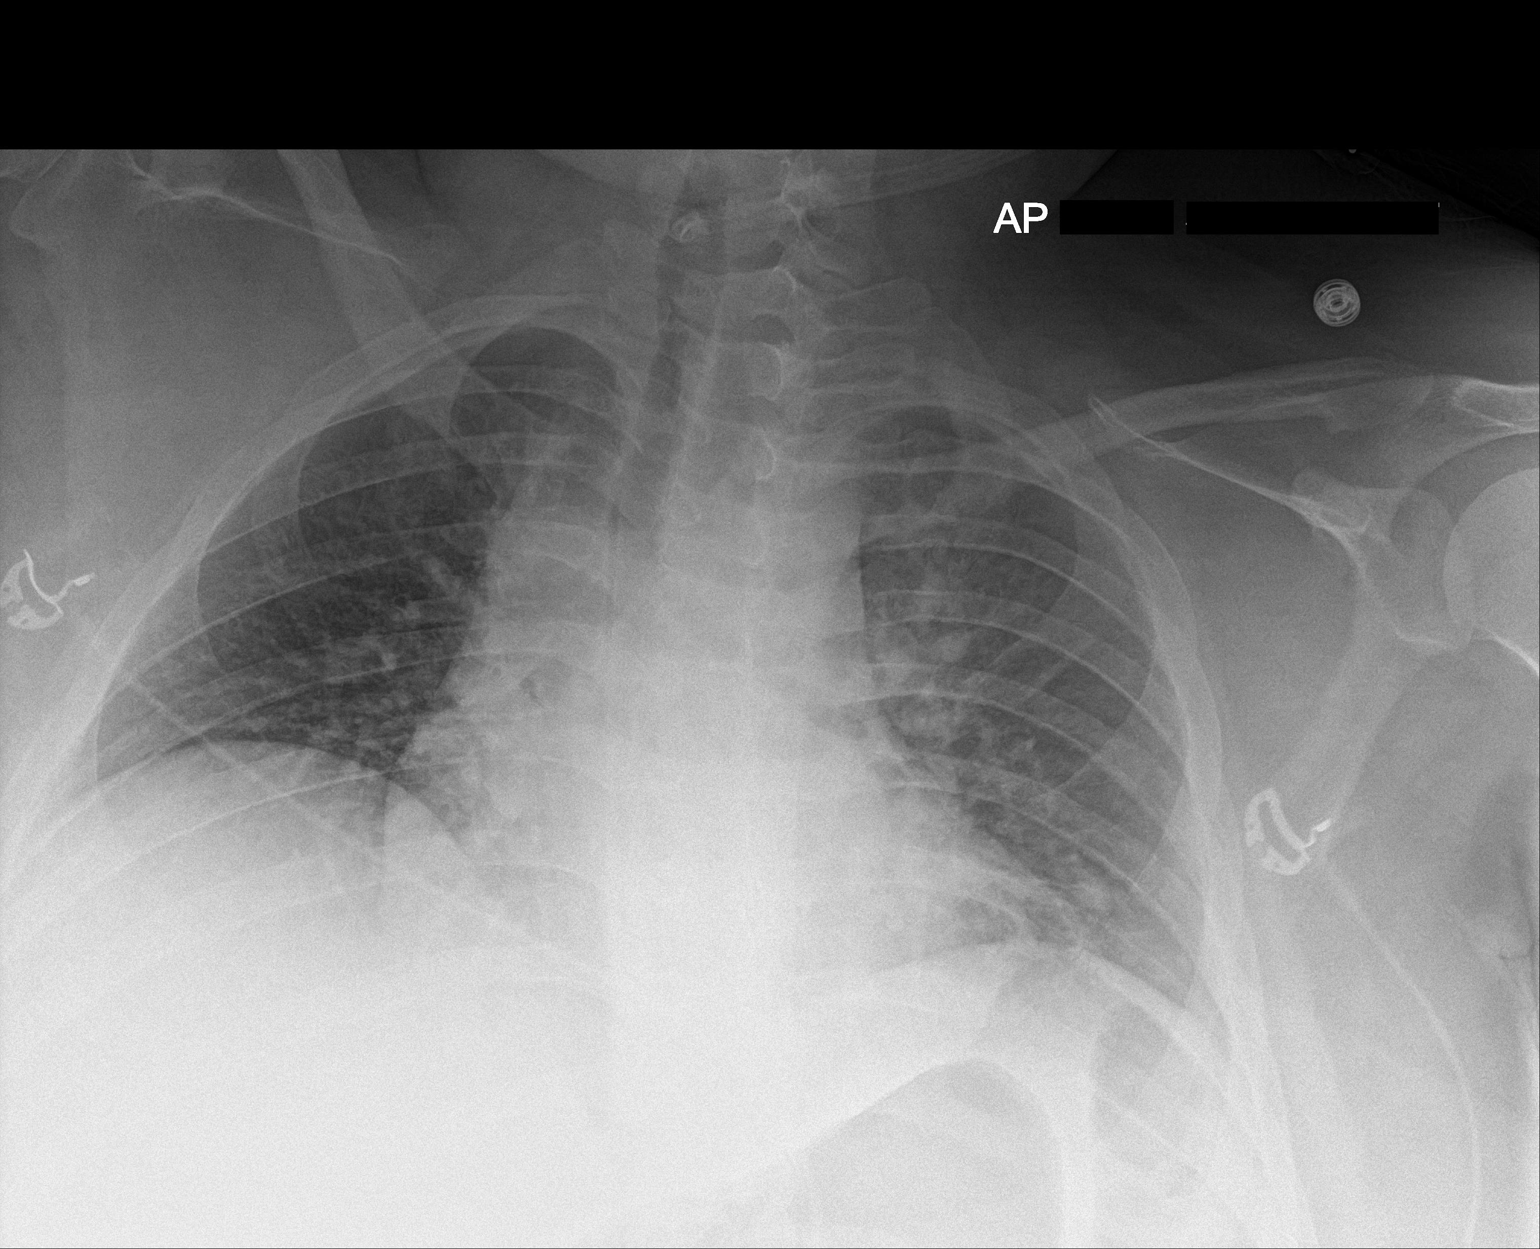

[1 of 1 positions shown; findings below may reference images not displayed]

FINDINGS: The heart size and mediastinal contours are within normal limits. No
pneumothorax or pleural effusion is noted. Hypoinflation of the
lungs is noted with minimal bibasilar subsegmental atelectasis. The
visualized skeletal structures are unremarkable.
IMPRESSION: Hypoinflation of the lungs with minimal bibasilar subsegmental
atelectasis.

## 2018-12-10 IMAGING — DX DG ABD PORTABLE 1V
2 series · 2 of 2 positions shown · non-contrast
Comparison: [DATE] abdominal radiograph

CLINICAL DATA: Dyspnea, abdominal pain and distention

EXAM:
PORTABLE ABDOMEN - 1 VIEW

[abdomen kub (1 of 2)]
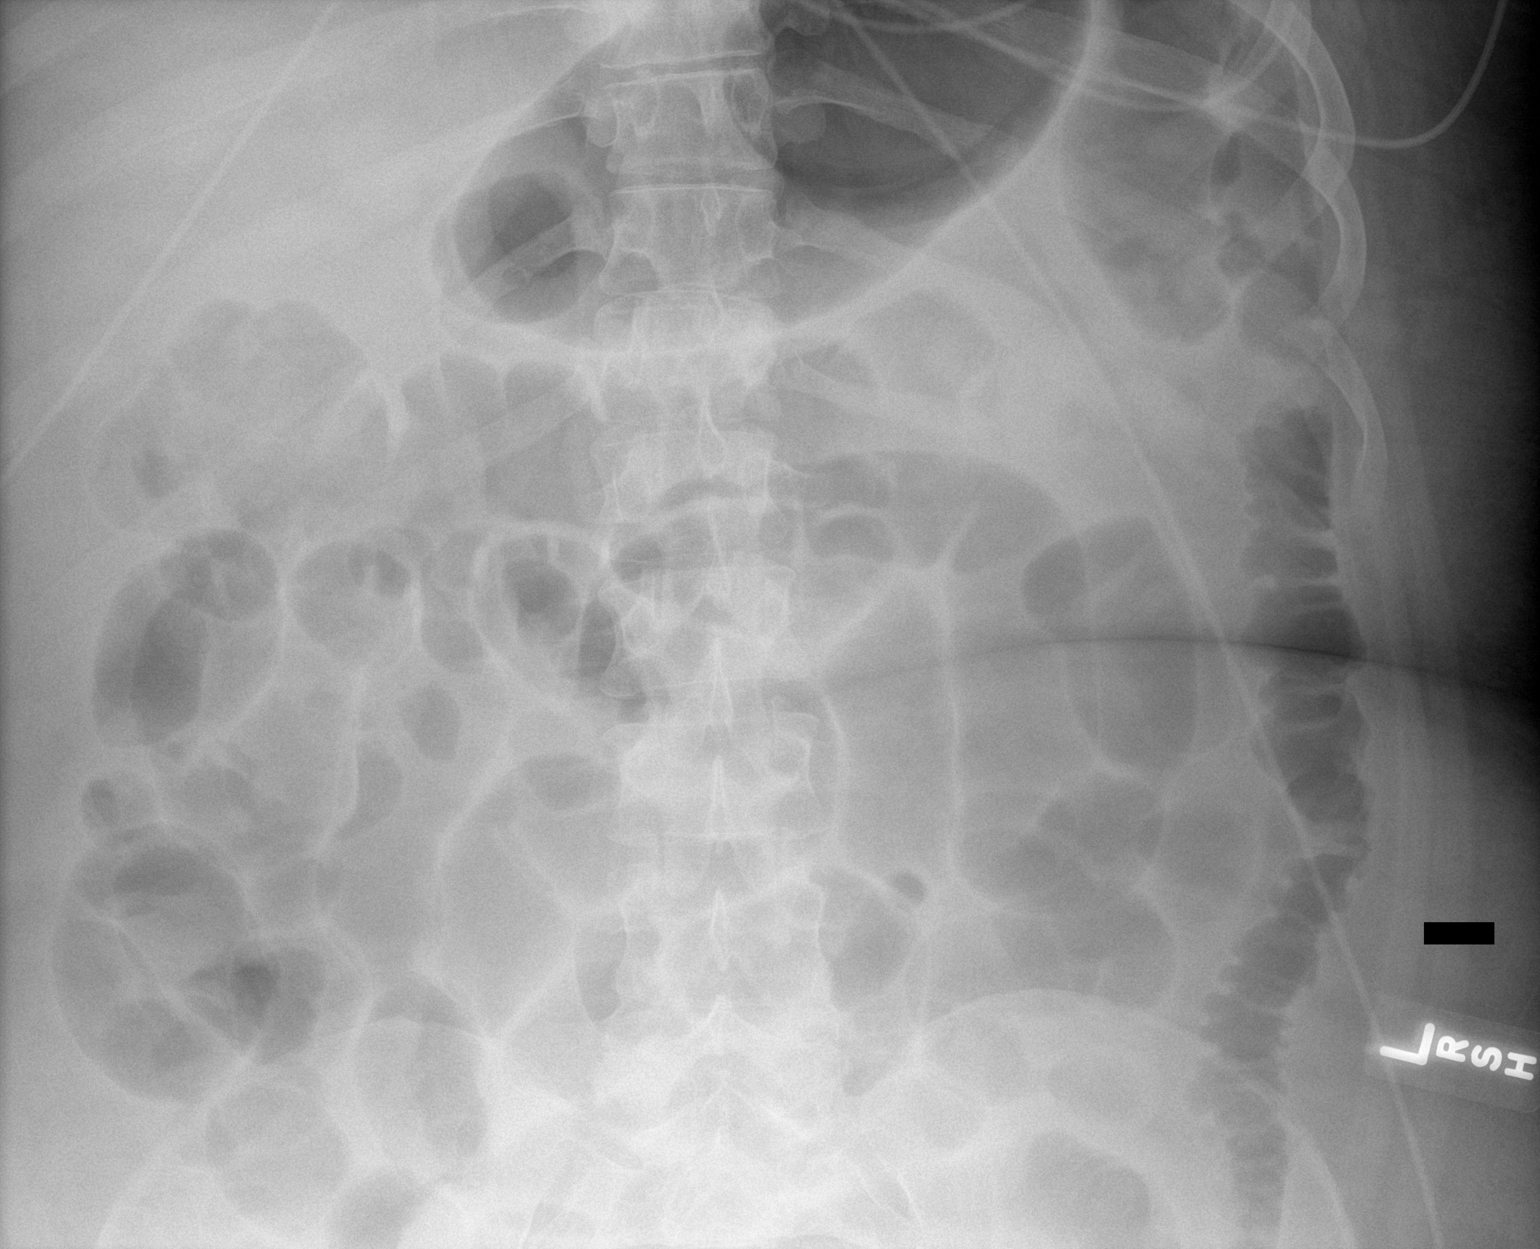

[abdomen kub (2 of 2)]
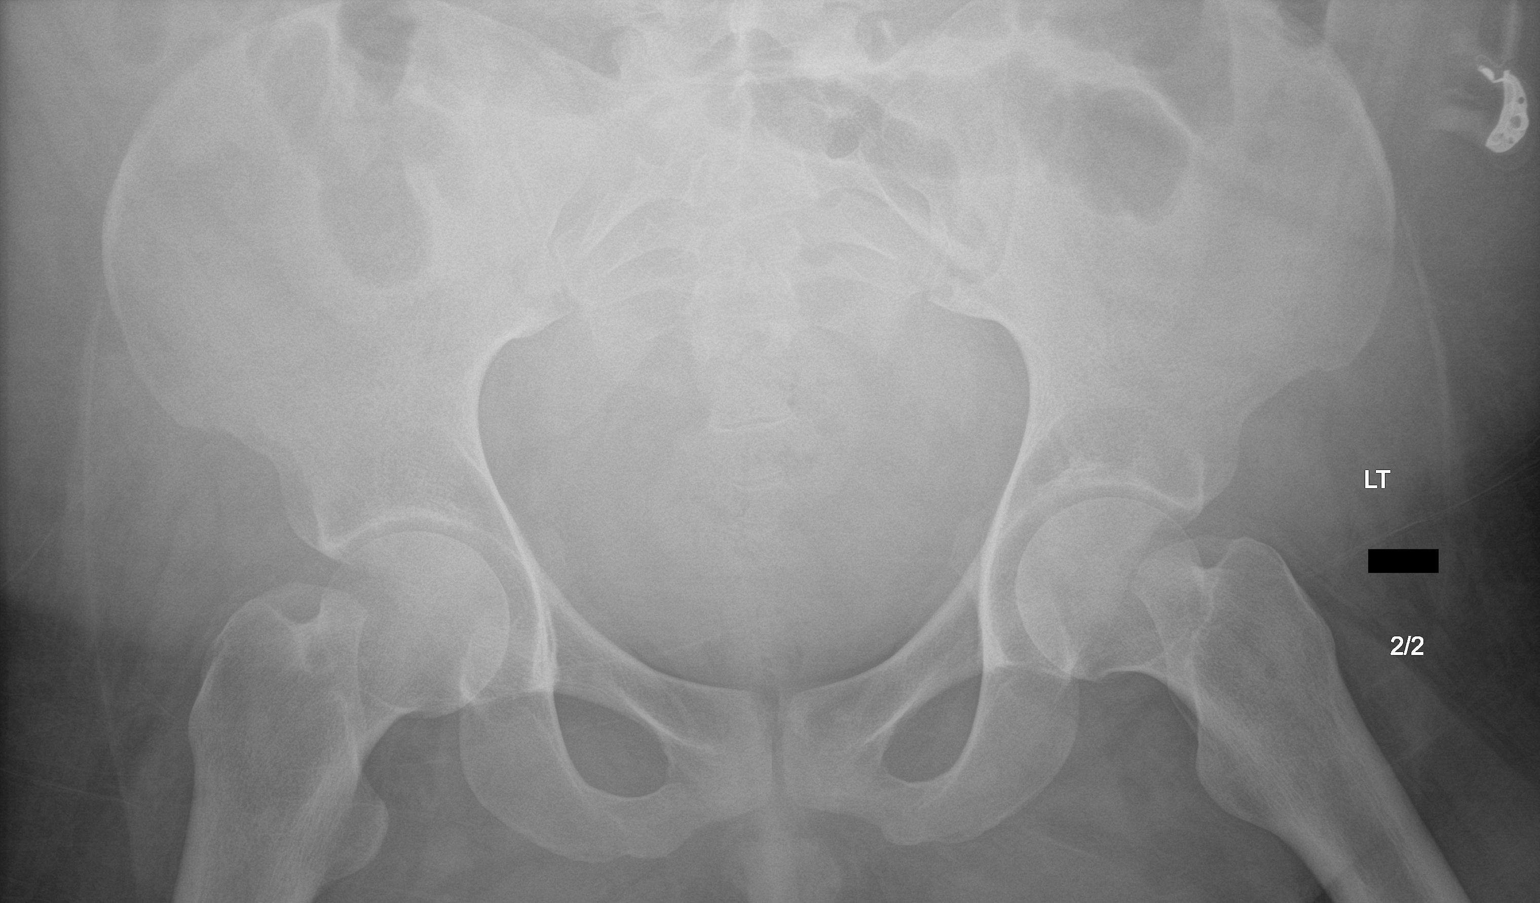

[2 of 2 positions shown; findings below may reference images not displayed]

FINDINGS: There are mildly dilated small bowel loops throughout the abdomen
measuring up to 3.9 cm diameter in the left abdomen, mildly
decreased in caliber. No evidence of pneumatosis or
pneumoperitoneum. No radiopaque nephrolithiasis.
IMPRESSION: Diffuse mild small bowel dilatation, mildly decreased, which could
represent either improving adynamic ileus or improving partial
distal small bowel obstruction.

## 2018-12-10 IMAGING — MR MR CERVICAL SPINE WO/W CM
15 of 21 series · 31 of 48 positions shown · IV contrast (gadavist)
Comparison: None.

CLINICAL DATA: Slowly progressive ataxia and seizures.

EXAM:
MRI CERVICAL SPINE WITHOUT AND WITH CONTRAST
TECHNIQUE: Multiplanar and multiecho pulse sequences of the cervical spine, to
include the craniocervical junction and cervicothoracic junction,
were obtained without and with intravenous contrast.
CONTRAST:  10mL GADAVIST GADOBUTROL 1 MMOL/ML IV SOLN

[Series 2: DWI · axial · 3.0mm · 0.94mm/px · z∈[-79,+83]mm · 6 of 110 slices shown (1 of 2)]
[im 1/110]
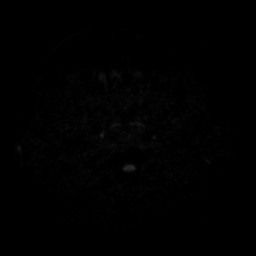
[im 22/110]
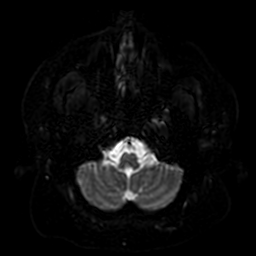
[im 44/110]
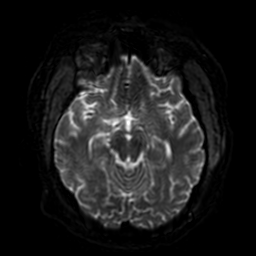
[im 66/110]
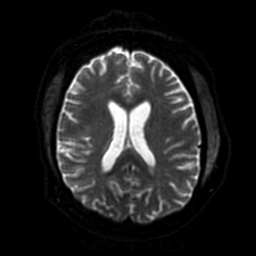
[im 88/110]
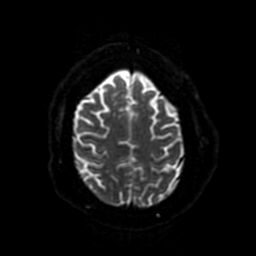
[im 110/110]
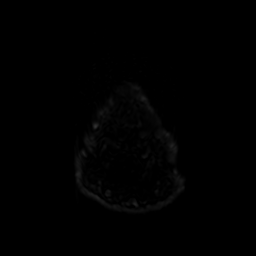

[Series 3: DWI · coronal · 4.0mm · 0.94mm/px · 4 of 72 slices shown (2 of 2)]
[im 1/72]
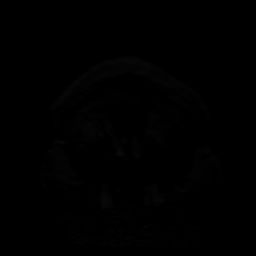
[im 24/72]
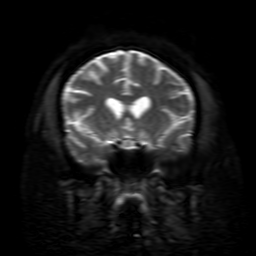
[im 48/72]
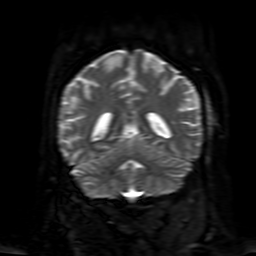
[im 72/72]
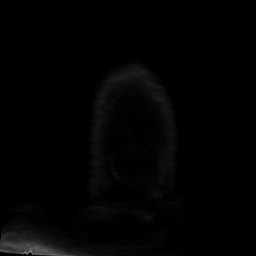

[Series 4: FLAIR · axial · 3.0mm · 0.45mm/px · 1 of 26 slices shown]
[im 1/26]
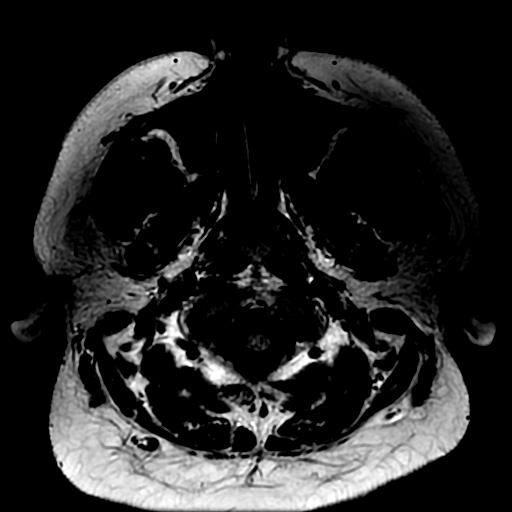

[Series 5: SWI · axial · 3.0mm · 0.47mm/px · z∈[-67,+86]mm · 5 of 104 slices shown]
[im 1/104]
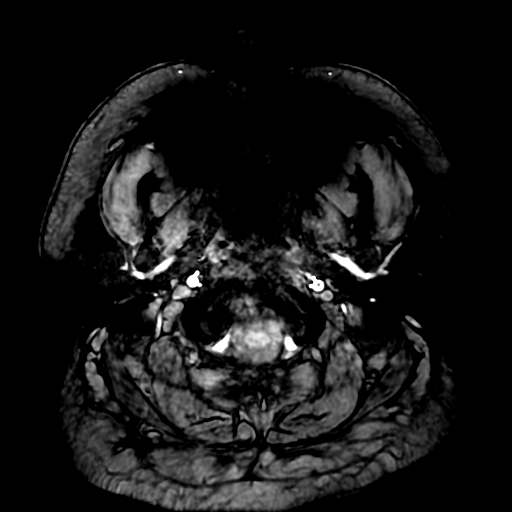
[im 26/104]
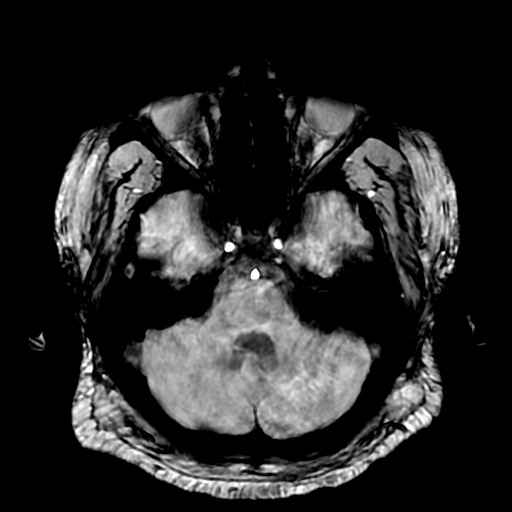
[im 52/104]
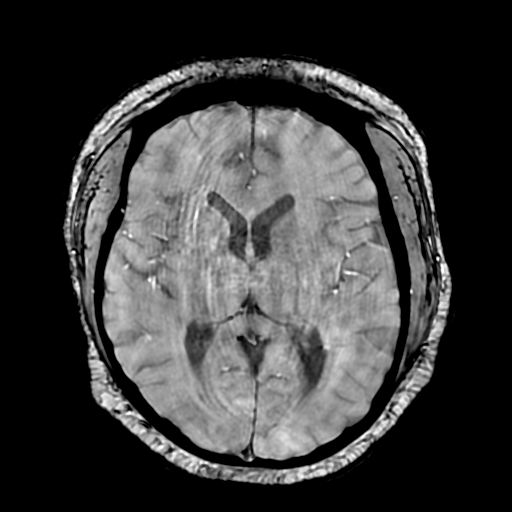
[im 78/104]
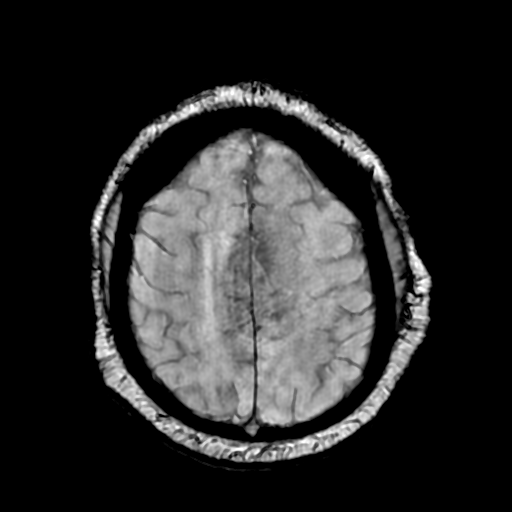
[im 104/104]
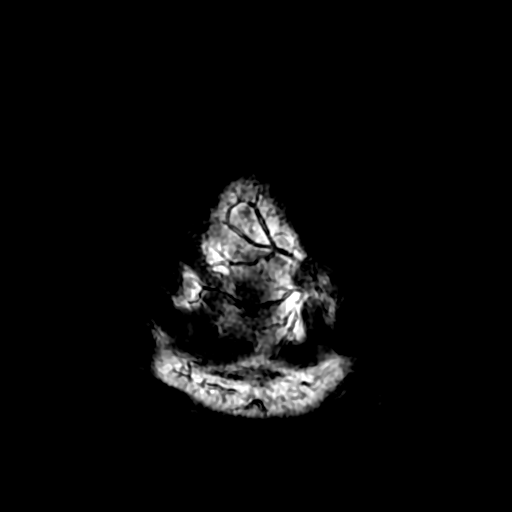

[Series 8: T2 · axial · 5.0mm · 0.45mm/px · 1 of 26 slices shown (1 of 3)]
[im 1/26]
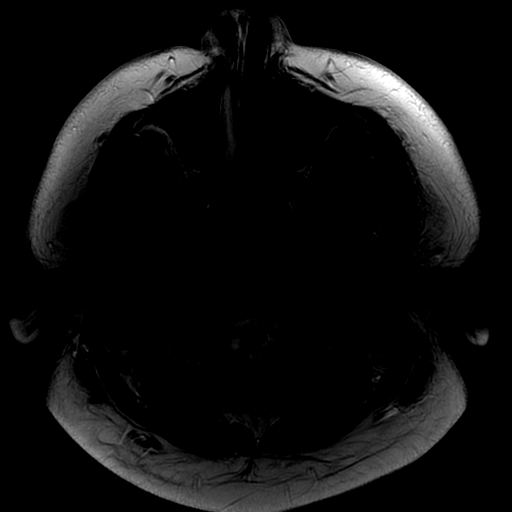

[Series 9: T1 · axial · non-contrast · 3.0mm · 0.94mm/px · z∈[-67,+85]mm · 3 of 52 slices shown (1 of 4)]
[im 1/52]
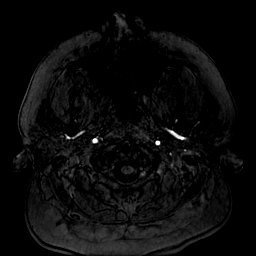
[im 26/52]
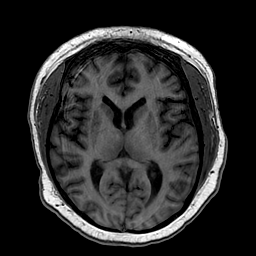
[im 52/52]
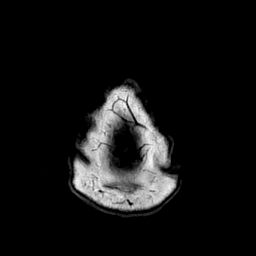

[Series 10: T2 · sagittal · 3.0mm · 0.43mm/px · 1 of 16 slices shown (2 of 3)]
[im 1/16]
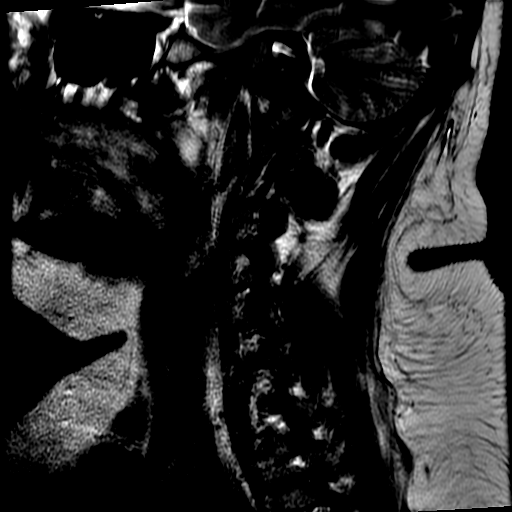

[Series 12: STIR · sagittal · 3.0mm · 0.43mm/px · 1 of 16 slices shown]
[im 1/16]
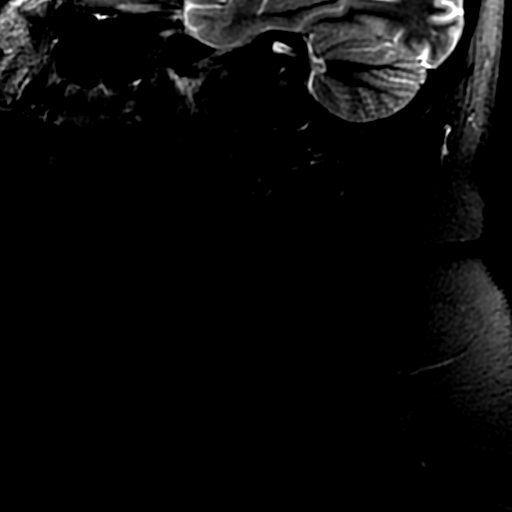

[Series 14: T1 · axial · 3.0mm · 0.35mm/px · 1 of 30 slices shown (2 of 4)]
[im 1/30]
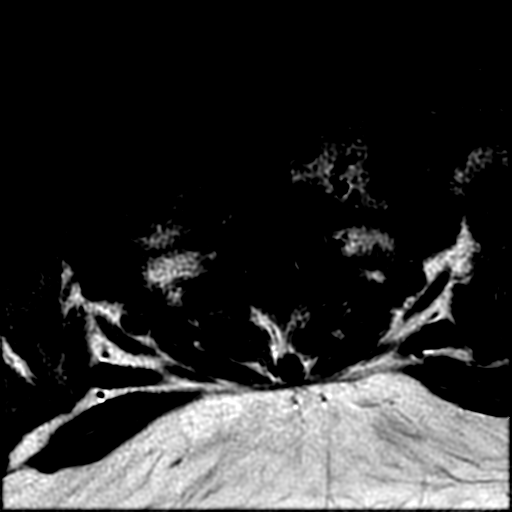

[Series 15: T2 · axial · 3.0mm · 0.35mm/px · 1 of 30 slices shown (3 of 3)]
[im 1/30]
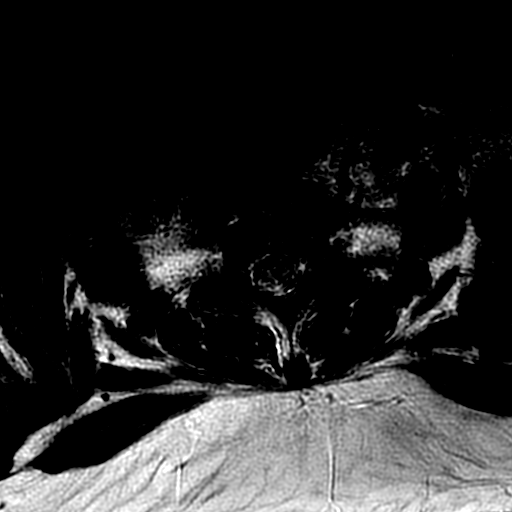

[Series 16: T1 fat-sat post-contrast · sagittal · 3.0mm · 0.43mm/px · 1 of 16 slices shown]
[im 1/16]
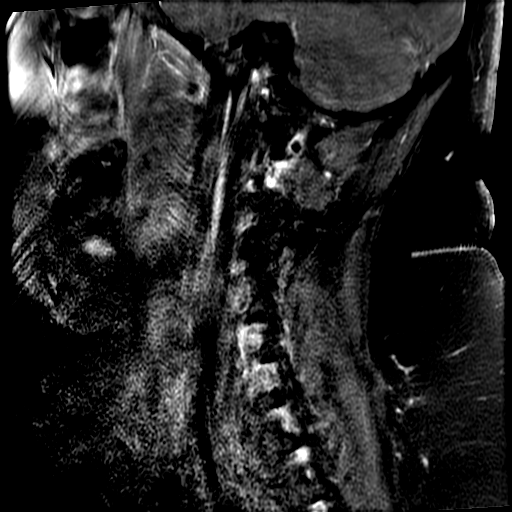

[Series 17: T1 post-contrast · axial · 3.0mm · 0.35mm/px · 1 of 30 slices shown]
[im 1/30]
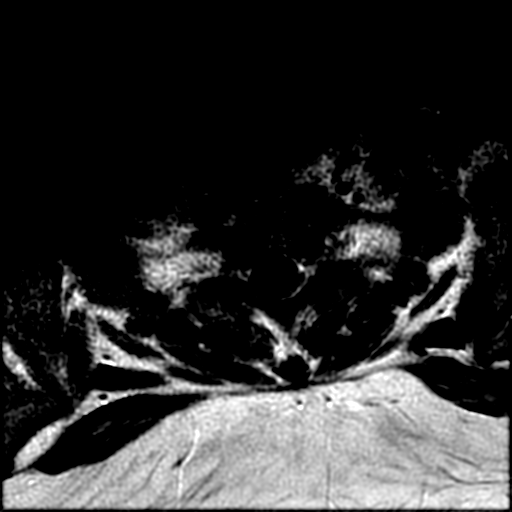

[Series 19: T1 · axial · 3.0mm · 0.94mm/px · z∈[-67,+85]mm · 3 of 52 slices shown (3 of 4)]
[im 1/52]
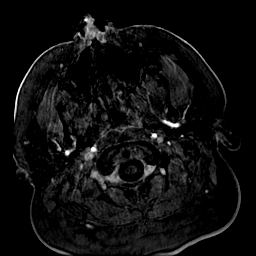
[im 26/52]
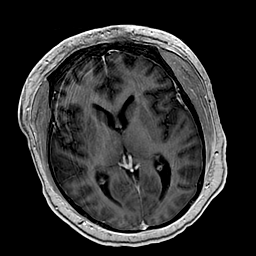
[im 52/52]
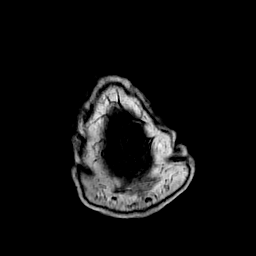

[Series 20: T1 · coronal · 5.0mm · 0.43mm/px · 1 of 30 slices shown (4 of 4)]
[im 1/30]
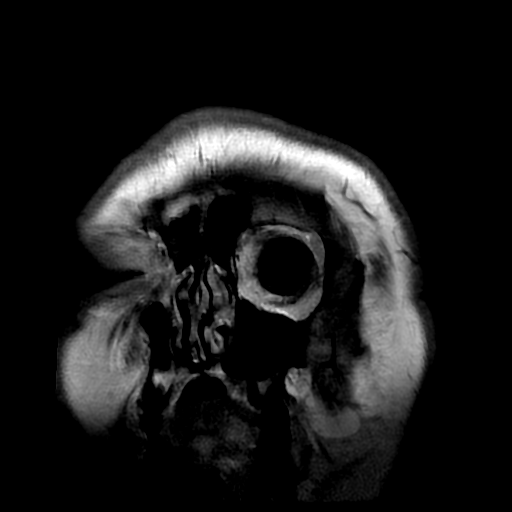

[Series 21: T2 post-contrast · coronal · 5.0mm · 0.47mm/px · 1 of 30 slices shown]
[im 1/30]
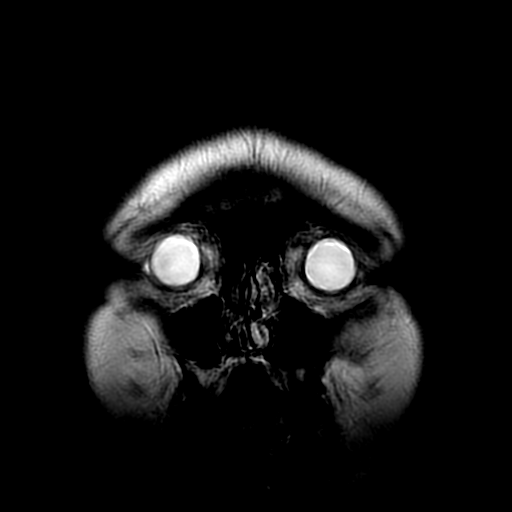

[31 of 48 positions shown; findings below may reference images not displayed]

FINDINGS: The study suffers from considerable motion degradation.

Alignment: Normal

Vertebrae: Normal

Cord: Normal

Posterior Fossa, vertebral arteries, paraspinal tissues: See results
of brain MRI. Paraspinal regions negative.

Disc levels:

Normal
IMPRESSION: Motion degraded exam.  No abnormality seen in the cervical region.

## 2018-12-10 IMAGING — RF DG ABDOMEN 1V
1 series · 1 of 1 positions shown · non-contrast
Comparison: Abdominal radiograph dated [DATE].

CLINICAL DATA: 23-year-old male status post NG tube placement.

EXAM:
ABDOMEN - 1 VIEW

[Series 1: fluoro_barium 1fps_bw · 0.17mm/px · 1 of 1 slices shown]
[im 1/1]
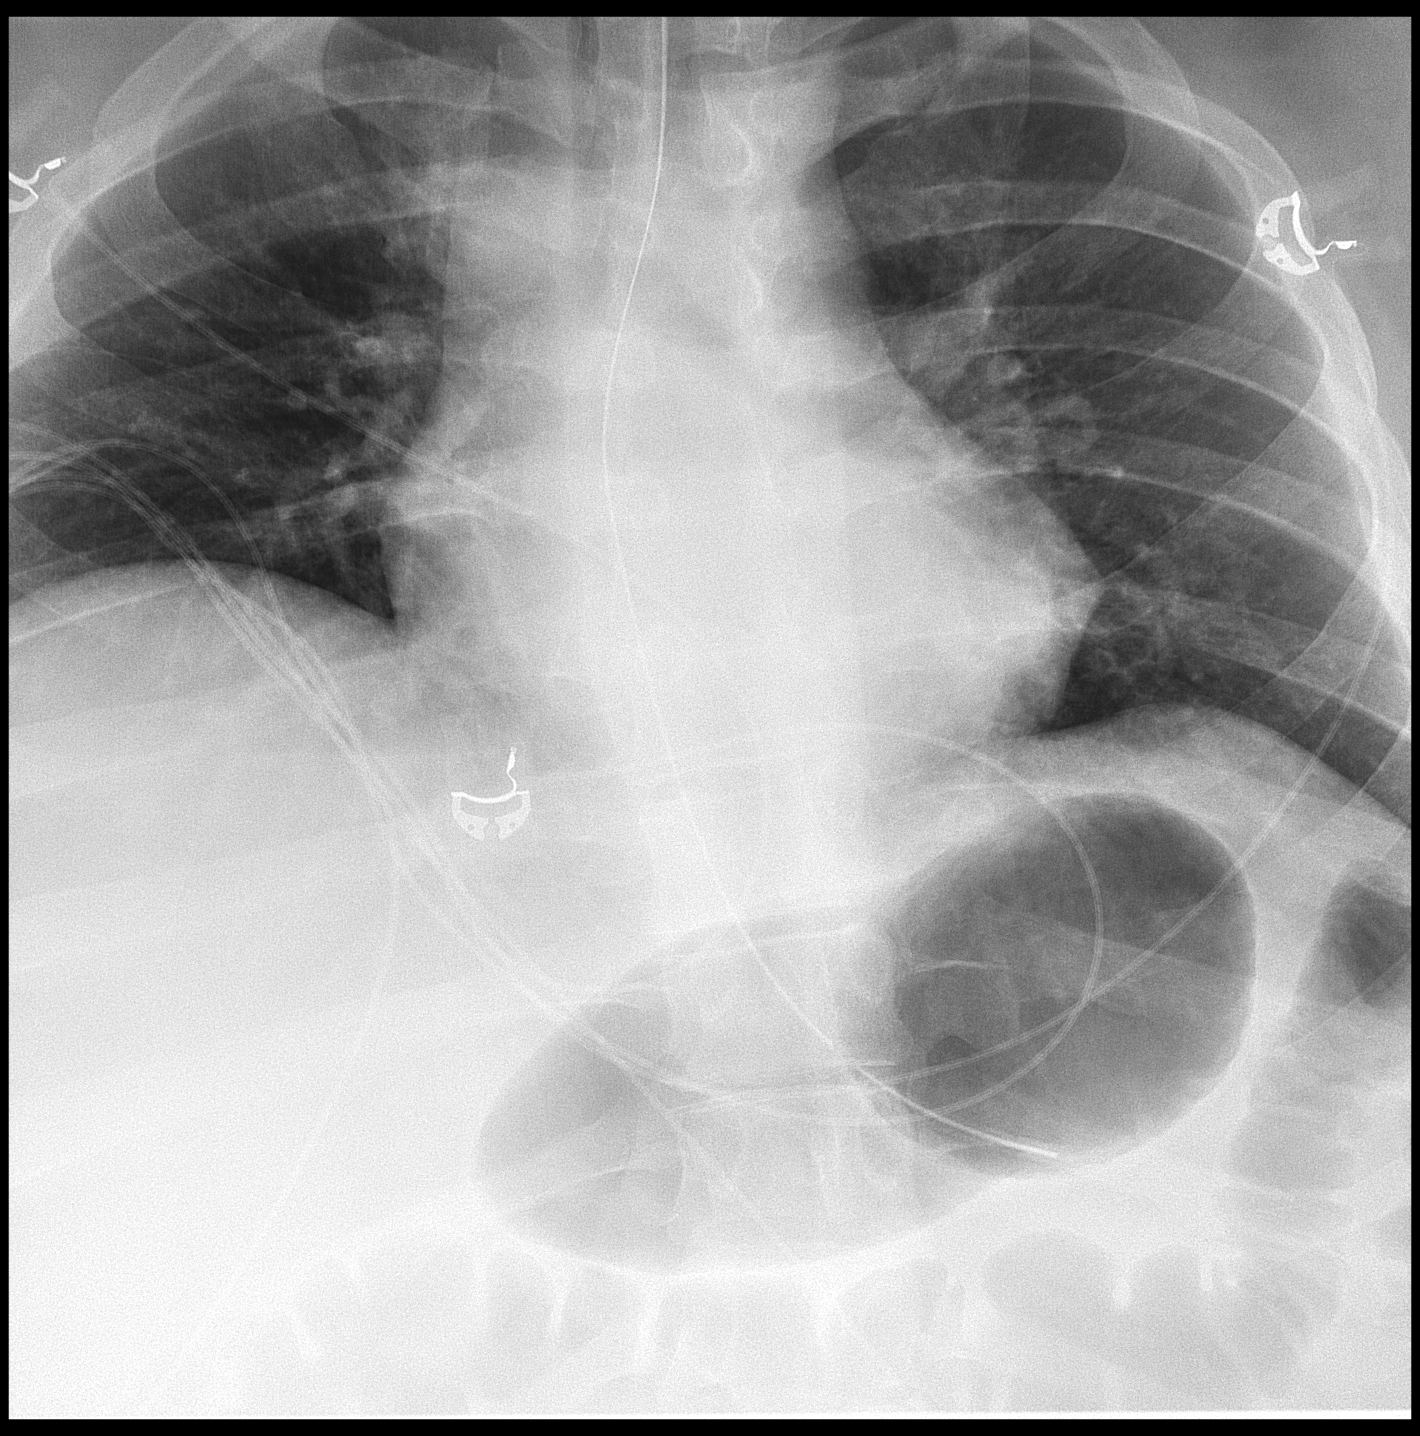

[1 of 1 positions shown; findings below may reference images not displayed]

FINDINGS: Partially visualized enteric tube with tip and side-port over the
gastric air. The osseous structures and soft tissues appear
unremarkable.
IMPRESSION: Enteric tube with tip and side-port over the gastric air.

## 2018-12-10 MED ORDER — THIAMINE HCL 100 MG/ML IJ SOLN: 100.0000 mg | INTRAMUSCULAR | Status: DC

## 2018-12-10 MED ORDER — THIAMINE HCL 100 MG/ML IJ SOLN: 500.0000 mg | Freq: Three times a day (TID) | INTRAVENOUS | Status: DC

## 2018-12-10 MED ORDER — LEVETIRACETAM IN NACL 1000 MG/100ML IV SOLN
1000.0000 mg | Freq: Two times a day (BID) | INTRAVENOUS | Status: DC
  Administered 2018-12-10 – 2018-12-19 (×17): 1000 mg via INTRAVENOUS
  Filled 2018-12-10 (×18): qty 100

## 2018-12-10 MED ORDER — METOPROLOL TARTRATE 5 MG/5ML IV SOLN
2.5000 mg | Freq: Four times a day (QID) | INTRAVENOUS | Status: DC | PRN
  Administered 2018-12-10 – 2018-12-16 (×7): 2.5 mg via INTRAVENOUS
  Filled 2018-12-10 (×7): qty 5

## 2018-12-10 MED ORDER — THIAMINE HCL 100 MG/ML IJ SOLN
500.0000 mg | INTRAVENOUS | Status: DC
  Filled 2018-12-10: qty 5

## 2018-12-10 MED ORDER — LORAZEPAM 2 MG/ML IJ SOLN
INTRAMUSCULAR | Status: AC
  Filled 2018-12-10: qty 1

## 2018-12-10 MED ORDER — METOPROLOL TARTRATE 5 MG/5ML IV SOLN
5.0000 mg | Freq: Once | INTRAVENOUS | Status: AC
  Administered 2018-12-10: 05:00:00 5 mg via INTRAVENOUS
  Filled 2018-12-10: qty 5

## 2018-12-10 MED ORDER — LIDOCAINE VISCOUS HCL 2 % MT SOLN
15.0000 mL | Freq: Once | OROMUCOSAL | Status: AC
  Administered 2018-12-10: 3 mL via OROMUCOSAL

## 2018-12-10 MED ORDER — THIAMINE HCL 100 MG/ML IJ SOLN
500.0000 mg | Freq: Three times a day (TID) | INTRAVENOUS | Status: AC
  Administered 2018-12-10 – 2018-12-13 (×9): 500 mg via INTRAVENOUS
  Filled 2018-12-10 (×9): qty 5

## 2018-12-10 MED ORDER — LACTATED RINGERS IV BOLUS
1000.0000 mL | Freq: Once | INTRAVENOUS | Status: AC
  Administered 2018-12-10: 1000 mL via INTRAVENOUS

## 2018-12-10 MED ORDER — FLEET ENEMA 7-19 GM/118ML RE ENEM
1.0000 | ENEMA | Freq: Once | RECTAL | Status: DC
  Filled 2018-12-10: qty 1

## 2018-12-10 MED ORDER — CHLORHEXIDINE GLUCONATE CLOTH 2 % EX PADS
6.0000 | MEDICATED_PAD | Freq: Every day | CUTANEOUS | Status: DC
  Administered 2018-12-10 – 2018-12-13 (×5): 6 via TOPICAL

## 2018-12-10 MED ORDER — GADOBUTROL 1 MMOL/ML IV SOLN
10.0000 mL | Freq: Once | INTRAVENOUS | Status: AC | PRN
  Administered 2018-12-10: 10 mL via INTRAVENOUS

## 2018-12-10 MED ORDER — CYANOCOBALAMIN 1000 MCG/ML IJ SOLN
1000.0000 ug | Freq: Every day | INTRAMUSCULAR | Status: AC
  Administered 2018-12-10 – 2018-12-16 (×7): 1000 ug via SUBCUTANEOUS
  Filled 2018-12-10 (×7): qty 1

## 2018-12-10 MED ORDER — PANTOPRAZOLE SODIUM 40 MG IV SOLR
40.0000 mg | Freq: Two times a day (BID) | INTRAVENOUS | Status: DC
  Administered 2018-12-10 – 2018-12-12 (×5): 40 mg via INTRAVENOUS
  Filled 2018-12-10 (×5): qty 40

## 2018-12-10 MED ORDER — IPRATROPIUM-ALBUTEROL 0.5-2.5 (3) MG/3ML IN SOLN
3.0000 mL | Freq: Four times a day (QID) | RESPIRATORY_TRACT | Status: DC
  Administered 2018-12-10 – 2018-12-12 (×6): 3 mL via RESPIRATORY_TRACT
  Filled 2018-12-10 (×5): qty 3

## 2018-12-10 MED ORDER — LORAZEPAM 2 MG/ML IJ SOLN
0.5000 mg | Freq: Once | INTRAMUSCULAR | Status: DC | PRN
  Filled 2018-12-10: qty 1

## 2018-12-10 NOTE — Progress Notes (Signed)
EEG complete - results pending 

## 2018-12-10 NOTE — Consult Note (Addendum)
NAME:  Aaron Vega, MRN:  [id number], DOB:  1995-04-19, LOS: 2 ADMISSION DATE:  12/07/2018, CONSULTATION DATE:  12/10/2018 REFERRING MD:  Lajuana Ripple - [facility], CHIEF COMPLAINT:  Rising lactate level.  Brief History   23 year old autistic man with abdominal distension and vomiting. Increasing lactic acidosis.   History of present illness   Known for poor and declining health status from severe autism with seizures, severe constipation recently.  His QoL was significantly better prior to 08/2018 when he suffered a severe seizure at home and struck his head. Seen in ED only but has has worsening mental status since. Per chart, this was ascribed to  hypoxic ischemic encephalopathy following prolonged seizure.   Presented via ED for increasing lethargy and vomiting and decreased urine output.   Significant disconnect regarding goals of care as currently receiving hospice care at home but still full code and mother not yet willing to place limits on care.  Unable to place NGTx2. Increasing respiratory distress and worsening lactic acidosis in spite of fluid resuscitation and normal blood pressure.  Past Medical History   Past Medical History:  Diagnosis Date  . Autistic disorder, current or active state   . Fatty liver   . Hypertension   . Obesity   . Pancreatitis    at age 72 years  . Pneumonia   . Pre-diabetes   . Seizures (HCC)    as of 11/23/15 - no seizures for 3 month  . Sleep apnea    not on cpap (can't tolerate)  . Tics of organic origin   . Tourette syndrome    Past Surgical History:  Procedure Laterality Date  . BIOPSY  11/08/2018   Procedure: BIOPSY;  Surgeon: Beverley Fiedler, MD;  Location: [facility];  Service: Gastroenterology;;  . ESOPHAGOGASTRODUODENOSCOPY (EGD) WITH PROPOFOL N/A 11/08/2018   Procedure: ESOPHAGOGASTRODUODENOSCOPY (EGD) WITH PROPOFOL;  Surgeon: Beverley Fiedler, MD;  Location: [facility];  Service: Gastroenterology;  Laterality: N/A;  . FLEXIBLE  SIGMOIDOSCOPY N/A 11/21/2018   Procedure: FLEXIBLE SIGMOIDOSCOPY;  Surgeon: Lemar Lofty., MD;  Location: [facility];  Service: Gastroenterology;  Laterality: N/A;  . IMPACTION REMOVAL  11/21/2018   Procedure: IMPACTION REMOVAL;  Surgeon: Lemar Lofty., MD;  Location: [facility];  Service: Gastroenterology;;  . RADIOLOGY WITH ANESTHESIA N/A 11/24/2015   Procedure: CT SCAN ABDOMEN AND PELVIS WITH CONTRAST;  Surgeon: Medication Radiologist, MD;  Location: [facility] OR;  Service: Radiology;  Laterality: N/A;  . TONSILLECTOMY     and adenoidectomy    Significant Hospital Events   11/14 admission to [facility]. 11/15 increasing lactate despite fluids.  Consults:  PCCM 11/16.  Procedures:  none  Significant Diagnostic Tests:  11/14 - CT abdomen/pelvis: obese, small LLL infiltrate, dilated gas filled loops of small and large bowel to rectum, no transition. No gastric distension, moderate stool burden in ascending colon.  Micro Data:  SARS-Cov2 -negative Blood cultures - negative Urine culture - negative  Antimicrobials:  Cefepime 11/14 -  Flagyl 11/14-  Interim history/subjective:  Mother reports worsening mental status with fluctuations since August but no clear increase in clinical seizure activity. Generalized weakness attributed to 'neuropathy'. Vomiting has been a increasing and persistent problem as has poor oral intake. Has not taken his Omfi for seizures for several days.  Objective   Blood pressure 124/87, pulse (!) 134, temperature (!) 101.9 F (38.8 C), temperature source Axillary, resp. rate 20, height  (1.803 m), weight 135.2 kg, SpO2 97 %.  Intake/Output Summary (Last 24 hours) at 12/10/2018 0845 Last data filed at 12/10/2018 0537 Gross per 24 hour  Intake 4500 ml  Output 2900 ml  Net 1600 ml   Filed Weights   Feb 06, 2018 1402 Feb 06, 2018 1418  Weight: (!) 148.8 kg 135.2 kg    Examination: General: obese man with abnormal facies, decreased  responsiveness.  HENT: no icterus, roving eye movements. Dry oral mucosa. Lungs: sonorous breathing but lungs are clear. Cardiovascular: extremities are warm and appears well perfused. HS normal, no edema. Abdomen: non-tender, not distended, bowel sounds are present.  Extremities: no active joints Neuro: responds appropriately to painful stimuli. No clear seizure activity. GU: Condom catheter in place.  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 Seizure disorder Obstipation and possible SB ileus.  Aspiration pneumonia Oliguria with possible early acute kidney injury.   Believe that we are largely dealing with SIRS from SBO and dehydration rather than true sepsis.  Bacterial overgrowth may also be contributing to rising lactate as would impaired renal clearance. Subacute history of worsening mentation attributed to anoxia, but CT not impressive and index seizure in August not prolonged per mom. NCSE needs to be excluded.   Will admit to ICU for more aggressive fluid resuscitation and advanced hemodynamic monitoring, need to balance fluid administration against pulmonary status, non-invasive volume assessment with ClearSight may be useful. Will obtain EEG and neurology consultation for seizure management. NGT for decompression and administration of laxatives and trial of prokinetics (Linzess from home never started).  Daily Goals Checklist  Pain/Anxiety/Delirium protocol (if indicated): none, prn lorazepam for seizures. VAP protocol (if indicated): N/A Respiratory support goals: continue Crowder. Blood pressure target: allow autoregulation. DVT prophylaxis: UFH tid Nutritional status and feeding goals: initiate NGT feeds once stomach decompressed. Progress slowly starting with trickle feeds. GI prophylaxis: Protonix bid for esophagitis Fluid status  goals: continue fluid resuscitation, likely total body water dehydration.  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 Monitor when start feeding. Mobility/therapy needs: bedrest for now. Antibiotic de-escalation: Continue current empiric antibiotics Home medication reconciliation: Restart Omfi once enteral access available Daily labs: BMP daily Code Status: FULL Family Communication: spoke with mother and father. Explained above and told them that will attempt to correct lactic acidosis and rule out seizures in attempt to get to underlying cause for decline. They understand that there may be nothing reversible to find. Disposition: Transfer to ICU.  Labs   CBC: Recent Labs  Lab Feb 06, 2018 1425 12/09/18 0114 12/10/18 0250  WBC 9.1 5.6 4.6  NEUTROABS 6.4 3.9 2.8  HGB 14.0 12.4* 10.9*  HCT 42.3 37.3* 33.3*  MCV 82.1 82.9 84.5  PLT 603* 358 331    Basic Metabolic Panel: Recent Labs  Lab Feb 06, 2018 1425 12/09/18 0114 12/10/18 0250  NA 132* 135 137  K 3.1* 3.5 4.0  CL 88* 98 104  CO2 23 23 16*  GLUCOSE 137* 107* 90  BUN 19 14 10   CREATININE 1.18 0.56* 0.71  CALCIUM 9.5 8.5* 8.4*   GFR: Estimated Creatinine Clearance: 205.2 mL/min (by C-G formula based on SCr of 0.71 mg/dL). Recent Labs  Lab Feb 06, 2018 1425  12/09/18 0114 12/09/18 1939 12/09/18 2202 12/10/18 0245 12/10/18 0250 12/10/18 0545  PROCALCITON  --   --  0.36  --   --   --   --   --  WBC 9.1  --  5.6  --   --   --  4.6  --   LATICACIDVEN  --    < > 4.7* 7.2* 7.5* 8.1*  --  8.1*   < > = values in this interval not displayed.    Liver Function Tests: Recent Labs  Lab 2018/12/21 1425 12/09/18 0114 12/10/18 0250  AST 93* 71* 69*  ALT 159* 124* 109*  ALKPHOS 49 39 35*  BILITOT 1.1 1.3* 0.8  PROT 6.5 5.6* 5.4*  ALBUMIN 3.5 2.9* 2.5*   No results for input(s): LIPASE, AMYLASE in the last 168 hours. No results for input(s):  AMMONIA in the last 168 hours.  ABG No results found for: PHART, PCO2ART, PO2ART, HCO3, TCO2, ACIDBASEDEF, O2SAT   Coagulation Profile: Recent Labs  Lab 12/21/2018 1425 12/09/18 0114 12/09/18 2252  INR 2.9* 2.8* 3.1*    Cardiac Enzymes: No results for input(s): CKTOTAL, CKMB, CKMBINDEX, TROPONINI in the last 168 hours.  HbA1C: No results found for: HGBA1C  CBG: Recent Labs  Lab 12/09/18 1122  GLUCAP 99    Review of Systems:   Patient is non-verbal and cannot provide review of systems.  Past Medical History  He,  has a past medical history of Autistic disorder, current or active state, Fatty liver, Hypertension, Obesity, Pancreatitis, Pneumonia, Pre-diabetes, Seizures (HCC), Sleep apnea, Tics of organic origin, and Tourette syndrome.   Surgical History    Past Surgical History:  Procedure Laterality Date  . BIOPSY  11/08/2018   Procedure: BIOPSY;  Surgeon: Beverley Fiedler, MD;  Location: [facility];  Service: Gastroenterology;;  . ESOPHAGOGASTRODUODENOSCOPY (EGD) WITH PROPOFOL N/A 11/08/2018   Procedure: ESOPHAGOGASTRODUODENOSCOPY (EGD) WITH PROPOFOL;  Surgeon: Beverley Fiedler, MD;  Location: [facility];  Service: Gastroenterology;  Laterality: N/A;  . FLEXIBLE SIGMOIDOSCOPY N/A 11/21/2018   Procedure: FLEXIBLE SIGMOIDOSCOPY;  Surgeon: Lemar Lofty., MD;  Location: [facility];  Service: Gastroenterology;  Laterality: N/A;  . IMPACTION REMOVAL  11/21/2018   Procedure: IMPACTION REMOVAL;  Surgeon: Lemar Lofty., MD;  Location: [facility];  Service: Gastroenterology;;  . RADIOLOGY WITH ANESTHESIA N/A 11/24/2015   Procedure: CT SCAN ABDOMEN AND PELVIS WITH CONTRAST;  Surgeon: Medication Radiologist, MD;  Location: [facility];  Service: Radiology;  Laterality: N/A;  . TONSILLECTOMY     and adenoidectomy     Social History   reports that he has never smoked. He has never used smokeless tobacco. He reports that he does not drink alcohol or use drugs.    Family History   His family history includes Anxiety disorder in his paternal uncle and another family member; Bipolar disorder in his paternal uncle and another family member; Depression in his paternal uncle and another family member; Hypertension in an other family member; Seizures in his cousin and maternal aunt.   Allergies No Known Allergies   Home Medications  Prior to Admission medications   Medication Sig Start Date End Date Taking? Authorizing Provider  acetaminophen (TYLENOL) 650 MG suppository Place 650 mg rectally every 6 (six) hours as needed for mild pain or fever.  11/05/18  Yes [provider]  citalopram (CELEXA) 40 MG tablet Take 40 mg by mouth daily. 04/24/14  Yes [provider]  cloBAZam (ONFI) 20 MG tablet Take 60 mg by mouth 2 (two) times daily. 10/16/18  Yes [provider]  diazepam (DIASAT) 20 MG GEL Place 20 mg rectally daily as needed (for seizures lasting more than 5 minutes.).  07/03/13  Yes [provider]  diazepam (VALIUM) 5 MG tablet Take 7.5 mg by mouth every morning. May give 0.5 tablet (2.5mg ) as needed for agitation/anxiety 10/15/18  Yes [provider]  levothyroxine (SYNTHROID) 25 MCG tablet Take 1 tablet (25 mcg total) by mouth daily before breakfast. 11/25/18  Yes Mariel Aloe, MD  lisinopril (PRINIVIL,ZESTRIL) 20 MG tablet Take 20 mg by mouth daily. Take with 5mg  tablet for a total dose of 25mg  04/15/14  Yes [provider]  lisinopril (ZESTRIL) 5 MG tablet Take 5 mg by mouth daily. Take with 20mg  tablet for a total dose of 25mg  10/14/18  Yes [provider]  Melatonin 5 MG TABS Take 5 mg by mouth at bedtime.   Yes [provider]  Midazolam (NAYZILAM) 5 MG/0.1ML SOLN Place 5 mg into the nose daily as needed (seizure).   Yes [provider]  nystatin (MYCOSTATIN/NYSTOP) 100000 UNIT/GM POWD Apply 1 application topically daily. Neck, crevices, etc. 04/23/14  Yes [provider]  omeprazole (PRILOSEC) 40 MG capsule Take 1 capsule (40 mg total) by mouth 2 (two) times daily. Open capsule and sprinkle into applesauce or yoghurt. 11/27/18  Yes Mariel Aloe, MD  ondansetron (ZOFRAN ODT) 4 MG disintegrating tablet Take 1 tablet (4 mg total) by mouth every 8 (eight) hours as needed for nausea or vomiting. 11/25/18  Yes Mariel Aloe, MD  polyethylene glycol (MIRALAX) 17 g packet Take 17 g by mouth 2 (two) times daily. Patient taking differently: Take 17 g by mouth daily as needed for mild constipation.  11/25/18  Yes Mariel Aloe, MD  promethazine (PHENERGAN) 25 MG suppository Place 1 suppository (25 mg total) rectally every 6 (six) hours as needed for nausea or vomiting. 11/12/18  Yes Carmon Sails J, PA-C  sucralfate (CARAFATE) 1 GM/10ML suspension Take 10 mLs (1 g total) by mouth 4 (four) times daily -  with meals and at bedtime. 11/27/18  Yes Mariel Aloe, MD  linaclotide Rolan Lipa) 290 MCG CAPS capsule Take 1 capsule (290 mcg total) by mouth daily before breakfast. Patient not taking: Reported on 12/02/2018 11/26/18   Mariel Aloe, MD  MILK OF MAGNESIA 400 MG/5ML suspension Take 30-60 mLs by mouth daily as needed for constipation. 12/06/18   [provider]  zonisamide (ZONEGRAN) 100 MG capsule Take 100-200 mg by mouth See admin instructions. Take one capsule every other night for two weeks, then take on capsule every night for two weeks, then take 200mg  every night. 11/27/18   [provider]    [name], MD [facility] ICU Physician [place]  Pager: [phone] Mobile: [phone] After hours: [phone].  12/10/2018, 8:45 AM

## 2018-12-10 NOTE — Consult Note (Addendum)
Neurology Consultation  Reason for Consult: Decline in mental status since August after seizure  Referring Physician: Dr. Denese Killings  History is obtained from: Mother  HPI: Aaron Vega is a 23 y.o. male with history of Tourette's syndrome, tics of organic origin, sleep apnea, seizures, pneumonia, pancreatitis, obesity, hypertension, fatty liver, and autistic disorder.  Per mother on August 22 patient had a significant seizure in which he did hit his head.  He sees a neurologist as an outpatient at [facility] who has been working with his medications.  Over the time period of August till now he was in the process of being titrated down on Vimpat but kept on Onfi at his usual dose.  Per patient's mother he was also placed on another drug but she does not recall the name.  Looking through the chart it appears that he was placed on Zonegran.  He was to take a Zonegran one 100 mg capsule every other night for 2 weeks then take 1 capsule every night for 2 weeks and then increase to 200 mg every night.  He currently is on 60 mg twice daily of Onfi.  Mother states that since the seizure she has noted a decline in the walking and cognition of the patient, in addition to onset of severe daily nausea with vomiting that has limited his PO intake. He has lost about 50 lbs since onset of the N/V.  She has noticed him progressively worsening regarding his gait, walking as though drunk. She also noted peripheral neuropathic symptoms; they initially started out with the fact he did not want his feet touched - this is very abnormal for patient, as he usually likes to have his mother rub his feet with lotion.  She then noticed that he was off balance.  This then progressed to the point where he had a hard time walking and she had to help him walk by holding him.  He began requiring a walker and then became bedbound.  This was then complicated by a short bowel obstruction, vomiting and unable to keep food down.  Unfortunately I  cannot get any further history as patient is mute.  Currently patient is sleeping heavily as he did receive 1 mg of Ativan at 1135.  Per mother when asked that he usually has deep tendon reflexes during his well visit she states that usually his knee jerk is very brisk.  She denies any other viral illness.  However, patient currently is being treated for severe sepsis syndrome in the setting of partial small bowel obstruction/ileus and dysphagia/aspiration, Respiratory distress with acute hypoxic respiratory failure, Dysphagia.  Of note patient is also on palliative care secondary to the decline in the patient's health.   Past Medical History:  Diagnosis Date  . Autistic disorder, current or active state   . Fatty liver   . Hypertension   . Obesity   . Pancreatitis    at age 29 years  . Pneumonia   . Pre-diabetes   . Seizures (HCC)    as of 11/23/15 - no seizures for 3 month  . Sleep apnea    not on cpap (can't tolerate)  . Tics of organic origin   . Tourette syndrome      Family History  Problem Relation Age of Onset  . Seizures Maternal Aunt   . Anxiety disorder Paternal Uncle   . Depression Paternal Uncle   . Bipolar disorder Paternal Uncle   . Seizures Cousin  Maternal 1st and 2nd cousins have seizures  . Anxiety disorder Other        Paternal fhx  . Depression Other        Paternal fhx  . Hypertension Other        Paternal fhx  . Bipolar disorder Other        Paternal fhx     Social History:   reports that he has never smoked. He has never used smokeless tobacco. He reports that he does not drink alcohol or use drugs.  Medications  Current Facility-Administered Medications:  .  0.9 %  sodium chloride infusion, 250 mL, Intravenous, PRN, Cristescu, Mircea G, MD .  0.9 %  sodium chloride infusion, , Intravenous, Continuous, Kamineni, Neelima, MD, Last Rate: 100 mL/hr at 12/10/18 0830 .  acetaminophen (TYLENOL) suppository 650 mg, 650 mg, Rectal, Q6H PRN,  Cristescu, Mircea G, MD, 650 mg at 12/10/18 1145 .  bisacodyl (DULCOLAX) EC tablet 5 mg, 5 mg, Oral, Daily PRN, Thomasenia BottomsAlam, Tawfikul, MD .  bisacodyl (DULCOLAX) suppository 10 mg, 10 mg, Rectal, Daily PRN, Thomasenia BottomsAlam, Tawfikul, MD .  ceFEPIme (MAXIPIME) 2 g in sodium chloride 0.9 % 100 mL IVPB, 2 g, Intravenous, Q8H, Cristescu, Mircea G, MD, Last Rate: 200 mL/hr at 12/10/18 0836, 2 g at 12/10/18 0836 .  cloBAZam (ONFI) tablet 60 mg, 60 mg, Oral, BID, Thomasenia BottomsAlam, Tawfikul, MD, 60 mg at 12/09/18 2218 .  hydrocortisone sodium succinate (SOLU-CORTEF) 100 MG injection 50 mg, 50 mg, Intravenous, Q6H, Cristescu, Mircea G, MD, 50 mg at 12/10/18 1120 .  ipratropium-albuterol (DUONEB) 0.5-2.5 (3) MG/3ML nebulizer solution 3 mL, 3 mL, Nebulization, Q6H, Kamineni, Neelima, MD .  lip balm (CARMEX) ointment, , Topical, PRN, Cristescu, Mircea G, MD .  LORazepam (ATIVAN) injection 1 mg, 1 mg, Intravenous, Q6H PRN, Thomasenia BottomsAlam, Tawfikul, MD, 1 mg at 12/10/18 1135 .  metoprolol tartrate (LOPRESSOR) injection 2.5 mg, 2.5 mg, Intravenous, Q6H PRN, Alessandra BevelsKamineni, Neelima, MD, 2.5 mg at 12/10/18 0939 .  metroNIDAZOLE (FLAGYL) IVPB 500 mg, 500 mg, Intravenous, Q8H, Cristescu, Mircea G, MD, Last Rate: 100 mL/hr at 12/10/18 0834, 500 mg at 12/10/18 0834 .  nystatin (MYCOSTATIN/NYSTOP) topical powder, , Topical, Daily, Cristescu, Mircea G, MD .  ondansetron (ZOFRAN) injection 4 mg, 4 mg, Intravenous, Q6H PRN, Cristescu, Mircea G, MD, 4 mg at 12/09/18 0516 .  pantoprazole (PROTONIX) injection 40 mg, 40 mg, Intravenous, Q12H, Kamineni, Neelima, MD, 40 mg at 12/10/18 0947 .  sodium chloride flush (NS) 0.9 % injection 10-40 mL, 10-40 mL, Intracatheter, Q12H, Thomasenia BottomsAlam, Tawfikul, MD, 10 mL at 12/09/18 0952 .  sodium chloride flush (NS) 0.9 % injection 10-40 mL, 10-40 mL, Intracatheter, PRN, Thomasenia BottomsAlam, Tawfikul, MD .  sodium chloride flush (NS) 0.9 % injection 3 mL, 3 mL, Intravenous, Q12H, Cristescu, Mircea G, MD, 3 mL at 12/09/18 0950 .  sodium chloride flush (NS) 0.9  % injection 3 mL, 3 mL, Intravenous, PRN, Cristescu, Mircea G, MD .  sodium phosphate (FLEET) 7-19 GM/118ML enema 1 enema, 1 enema, Rectal, Once, Kamineni, Neelima, MD   Exam: Current vital signs: BP 135/77   Pulse (!) 129   Temp (!) 101.1 F (38.4 C) (Axillary)   Resp (!) 26   Ht 5\' 11"  (1.803 m)   Wt 135.2 kg Comment: stated by mother  SpO2 99%   BMI 41.56 kg/m  Vital signs in last 24 hours: Temp:  [98.9 F (37.2 C)-101.9 F (38.8 C)] 101.1 F (38.4 C) (11/16 1125) Pulse Rate:  [120-142] 129 (11/16 1200)  Resp:  [20-36] 26 (11/16 1200) BP: (124-153)/(70-140) 135/77 (11/16 1200) SpO2:  [93 %-99 %] 99 % (11/16 1200)  ROS:-Obtained through mother  General ROS: negative for - chills, fatigue, fever, night sweats, weight gain or weight loss Psychological ROS: negative for - behavioral disorder, hallucinations, memory difficulties, mood swings or suicidal ideation Ophthalmic ROS: negative for - blurry vision, double vision, eye pain or loss of vision ENT ROS: negative for - epistaxis, nasal discharge, oral lesions, sore throat, tinnitus or vertigo Respiratory ROS: Positive for - shortness of breath  Cardiovascular ROS: negative for - chest pain, dyspnea on exertion, edema or irregular heartbeat Gastrointestinal ROS: Positive for -  nausea/vomiting Genito-Urinary ROS: negative for - dysuria, hematuria, incontinence or urinary frequency/urgency Musculoskeletal ROS: negative for - joint swelling or muscular weakness Neurological ROS: as noted in HPI Dermatological ROS: negative for rash and skin lesion changes   Physical Exam   Constitutional: Appears well-developed and well-nourished.  Psych: Currently extremely lethargic secondary to Ativan administration Eyes: No scleral injection HENT: No OP obstrucion, blood noted inside mouth Head: Normocephalic.  Cardiovascular: Normal rate and regular rhythm.  Respiratory: Effort normal, non-labored breathing GI: Soft.  distension.  There is no tenderness.  Skin: WDI  Neuro: Mental Status: Patient is resting comfortably, snoring and lethargic secondary to administration of Ativan.  Exam is very difficult secondary to patient being very lethargic. Cranial Nerves: II: No significant blink to threat. PERRL.  III,IV, VI: Exotropia. Has occasional slow saccades towards the direction of noxious stimuli. Non-rhythmic vertical nystagmus seen bilaterally (fast up-beating).  V: Winces to pain VII: Facial movement is symmetric.  Motor/Sensory: Decreased tone in all 4 extremities, worse in the lower extremities.  Will move BUE antigravity semipurposefully to noxious stimuli after a delay. No asymmetry noted.  No movement of RLE to persistent noxious plantar stimulation.  Weak 1-2/5 withdrawal of LLE to persistent noxious plantar stimulation.  With plantar stimulation, patient winces and waves upper extremities antigravity.  Deep Tendon Reflexes: 1+ and symmetric in the biceps and brachioradialis bilaterally. Absent patellar and achilles reflexes bilaterally.  Plantars: Mute bilaterally Cerebellar/Gait: Unable to test  Labs I have reviewed labs in epic and the results pertinent to this consultation are:   CBC    Component Value Date/Time   WBC 4.6 12/10/2018 0250   RBC 3.94 (L) 12/10/2018 0250   HGB 10.9 (L) 12/10/2018 0250   HCT 33.3 (L) 12/10/2018 0250   PLT 331 12/10/2018 0250   MCV 84.5 12/10/2018 0250   MCH 27.7 12/10/2018 0250   MCHC 32.7 12/10/2018 0250   RDW 13.1 12/10/2018 0250   LYMPHSABS 1.4 12/10/2018 0250   MONOABS 0.4 12/10/2018 0250   EOSABS 0.0 12/10/2018 0250   BASOSABS 0.0 12/10/2018 0250    CMP     Component Value Date/Time   NA 137 12/10/2018 0250   K 4.0 12/10/2018 0250   CL 104 12/10/2018 0250   CO2 16 (L) 12/10/2018 0250   GLUCOSE 90 12/10/2018 0250   BUN 10 12/10/2018 0250   CREATININE 0.71 12/10/2018 0250   CALCIUM 8.4 (L) 12/10/2018 0250   PROT 5.4 (L) 12/10/2018 0250    ALBUMIN 2.5 (L) 12/10/2018 0250   AST 69 (H) 12/10/2018 0250   ALT 109 (H) 12/10/2018 0250   ALKPHOS 35 (L) 12/10/2018 0250   BILITOT 0.8 12/10/2018 0250   GFRNONAA >60 12/10/2018 0250   GFRAA >60 12/10/2018 0250    Lipid Panel  No results found for: CHOL, TRIG, HDL, CHOLHDL, VLDL,  LDLCALC, LDLDIRECT   Imaging I have reviewed the images obtained:  CT-scan of the brain-no significant intracranial abnormality observed  LTM EEG - Running. No electrographic seizures seen on bedside review.   Felicie Morn PA-C [facility] [phone] 12/10/2018, 1:28 PM    Assessment: 23 year old autistic male with known epilepsy, nonverbal at baseline who presents with a 6 week history of rapid decline in cognition and gait in addition to new onset of peripheral neuropathic symptoms since the onset of intractable N/V approximately 6 weeks ago. He has sustained an approximate 50 lb weight loss over this time period.  1. 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 2. MRI findings are also consistent with Wernicke's encephalopathy.  3. Severe B12 deficiency with level of 159 today. This most likely is contributing to, or may be the sole etiology for his BLE weakness as well as the areflexia seen on exam. A sensory ataxia from this could be synergistic with the effects of acute thiamine deficiency.   4. AIDP or CIDP are possible etiologies for the BLE weakness and areflexia, but would be unlikely given proven B12 deficiency and high likelihood of acute thiamine deficiency. MRI of brain and cervical spine did not reveal any lesions to explain his BLE weakness and areflexia.   5. Multiple medical comorbidities this admission including small bowel obstruction, sepsis, aspiration pneumonia.   6. Recurrent  subclinical seizures also possible as an etiology or contributing factor regarding his AMS.    Recommendations: -Continue LTM EEG -Continue to treat underlying infection and metabolic abnormalities --B12 injections 1000 mcg qd x 7 days (ordered), followed by qweek for one month prior to rechecking levels and reassessing supplementation requirements. --Has been started on high-dose thiamine 500 mg IV TID x 3 days, then 250 mg IV TID x 3 days. Will need oral supplementation at 100 mg qd thereafter.  --MRI of thoracic and lumbar spine.  --Discussed at length with the patient's mother. All questions answered. She expressed understanding and agreement with the plan.  --Consider a fluoro-guided LP to assess for possible albuminocytologic dissociation if MRI T-spine and L-spine are also negative.  --Copper level (ordered) --Continue Onfi (clobazam) via tube --Keppra 1000 mg IV BID has been started  I have seen and examined the patient. I have formulated the assessment and plan. 23 year old male presenting with AMS, ataxia and nystagmus. MRI shows findings most consistent with acute thiamine deficiency. B12 also abnormally low. Most likely an acute nutritional encephalopathy and neuropathy secondary to prolonged intractable N/V. Recommendations as above.  Electronically signed: Dr. Caryl Pina

## 2018-12-10 NOTE — Progress Notes (Signed)
Pt unavailable; going to MRI.

## 2018-12-10 NOTE — Progress Notes (Addendum)
@   2054 12/09/2018 notified Dr Bodenheimer(on call) of Lactic Acid up form 4.7 to 7.2. Also informed MEWS remains red with score 5-8 due to tachycardia (138) and tacypenia (28) and temp100. Rapid was informed of same by charge nurse. Both Rapid Response nurse and MD came to bedside within 15 minutes. Pt resting quietly with eyes closed, however easily awakened. Mom of pt at bedside. Pt able to make faces toward staff on demand and moves toward straw and drinks when fluids offered. Boluses given as ordered as well as Metoprolol and lab work sent.  @ 0115 12/10/2018 notified Dr Silas [place] Lactic Acid up to 7.5 following 2 L bolus. INR - 3.1. Pt continues to rest quietly, easily awakened with mom at bedside. Also update MD that heart rate remains 130s.  @0410 . MD on call informed that Lactic Acid now 8.1.

## 2018-12-10 NOTE — Progress Notes (Addendum)
[facility]) hospital liaison note.  This patient is an active home care patient with [facility] with a hospice diagnosis of Idiopathic Progressive Neuropathy.   Patient is currently hospitalized for Aspiration pneumonia. Assessment and Plan: Subclinical shock with rising lactic acidosis and persistent tachycardia Acute on chronic encephalopathy due to aspiration. Seizure disorder Obstipation and possible SB ileus.  Aspiration pneumonia Oliguria with possible early acute kidney injury.   Left Vm with mother to return call.   Hospice will continue to follow and anticipate discharge needs.   Please call with any hospice questions.  Thank you, Farrel Gordon, [facility] (listed on [place]) [phone]

## 2018-12-10 NOTE — Significant Event (Addendum)
Rapid Response Event Note  Overview: Time Called:0809 Arrival OVZC:[id number]  Called to take a look at pt with high MEWS and worsening lung sounds per RN  Initial Focused Assessment: On arrival, pt laying in bed with mother at bedside. HR 133, BP 137/92, RR 37, spO2 94% on 3L [place]. Lung sounds rhonchi throughout. Pt non-verbal at baseline. Does follow commands and tracks with eyes. Temp 101.9.  Not due for tylenol. Ice packs placed for fever control.   Interventions: TRH Md at bedside to see pt  Ice packs  CXR abg NTS Palliative and CCM consulted  Plan of Care (if not transferred): Continue to monitor pt status. I was called away to another emergency. RN instructed to call with any changes or concerns. Awaiting results of abg, CXR and recommendations of consulted teams. Will follow up when available.   1045: CCM ordered to transfer pt to ICU for closer monitoring.Spoke with pt mother and bedside RN about plan of care.          Sherilyn Dacosta

## 2018-12-10 NOTE — Progress Notes (Signed)
Paged by bedside RN regarding pts lactic acid of 7.2 which is up from 4.7. Pt tachycardic in the 130's, tachypneic into the 30's and temp was 100. Upon entering room patient tachypneic but no increased work of breathing noted. Patient drowsy but awakens easily. Lung sounds slightly rhonchus bilaterally and he is ST on the monitor. Mother at bedside with pt. Ordered a CXR, 1L NS Bolus x2, and metoprolol 5mg  IV x 1. Repeat Lactic after boluses completed.   Arby Barrette AGPCNP-BC, AGNP-C [facility] Pager [phone]   Addendum: Repeat Lactic up to 7.5. No change in pts vitals he is still tachypneic, and tachycardic. O2 sats 97% on 3L via [place]. Will order 1L LR Bolus and re-trend lactic acids.   CXR: Low lung volumes, bibasilar atelectasis.

## 2018-12-10 NOTE — Progress Notes (Addendum)
Fluoroscopy called at 0645 to possibly get pt for NG. Page to MD on call to clarify if pt could go because of Lactic Acid and MEWS. Spoke to Production assistant, radio agrees needs clarification. Page to Dr Earnest Conroy at this time to clarify okay to go to fluoroscopy. Also informed Dr Earnest Conroy Lactic Acid is 8.1 still. Dr Earnest Conroy called back immediately and will follow up with family and orders soon.

## 2018-12-11 ENCOUNTER — Encounter (HOSPITAL_COMMUNITY): Payer: Self-pay | Admitting: Primary Care

## 2018-12-11 DIAGNOSIS — R6520 Severe sepsis without septic shock: Secondary | ICD-10-CM

## 2018-12-11 DIAGNOSIS — Z7189 Other specified counseling: Secondary | ICD-10-CM | POA: Diagnosis not present

## 2018-12-11 DIAGNOSIS — R4182 Altered mental status, unspecified: Secondary | ICD-10-CM | POA: Diagnosis not present

## 2018-12-11 DIAGNOSIS — A419 Sepsis, unspecified organism: Secondary | ICD-10-CM | POA: Diagnosis not present

## 2018-12-11 DIAGNOSIS — Z515 Encounter for palliative care: Secondary | ICD-10-CM

## 2018-12-11 DIAGNOSIS — K5900 Constipation, unspecified: Secondary | ICD-10-CM | POA: Diagnosis not present

## 2018-12-11 DIAGNOSIS — J189 Pneumonia, unspecified organism: Secondary | ICD-10-CM | POA: Diagnosis not present

## 2018-12-11 DIAGNOSIS — G40909 Epilepsy, unspecified, not intractable, without status epilepticus: Secondary | ICD-10-CM | POA: Diagnosis not present

## 2018-12-11 LAB — COMPREHENSIVE METABOLIC PANEL
ALT: 121 U/L — ABNORMAL HIGH (ref 0–44)
AST: 84 U/L — ABNORMAL HIGH (ref 15–41)
Albumin: 2.6 g/dL — ABNORMAL LOW (ref 3.5–5.0)
Alkaline Phosphatase: 32 U/L — ABNORMAL LOW (ref 38–126)
Anion gap: 14 (ref 5–15)
BUN: 17 mg/dL (ref 6–20)
CO2: 26 mmol/L (ref 22–32)
Calcium: 8.4 mg/dL — ABNORMAL LOW (ref 8.9–10.3)
Chloride: 111 mmol/L (ref 98–111)
Creatinine, Ser: 0.59 mg/dL — ABNORMAL LOW (ref 0.61–1.24)
GFR calc Af Amer: >60 mL/min (ref >60)
GFR calc non Af Amer: >60 mL/min (ref >60)
Glucose, Bld: 82 mg/dL (ref 70–99)
Potassium: 3.7 mmol/L (ref 3.5–5.1)
Sodium: 151 mmol/L — ABNORMAL HIGH (ref 135–145)
Total Bilirubin: 1.2 mg/dL (ref 0.3–1.2)
Total Protein: 5 g/dL — ABNORMAL LOW (ref 6.5–8.1)

## 2018-12-11 LAB — CBC WITH DIFFERENTIAL/PLATELET
Abs Immature Granulocytes: 0.06 10*3/uL (ref 0.00–0.07)
Basophils Absolute: 0 10*3/uL (ref 0.0–0.1)
Basophils Relative: 0 %
Eosinophils Absolute: 0 10*3/uL (ref 0.0–0.5)
Eosinophils Relative: 0 %
HCT: 32 % — ABNORMAL LOW (ref 39.0–52.0)
Hemoglobin: 10.5 g/dL — ABNORMAL LOW (ref 13.0–17.0)
Immature Granulocytes: 1 %
Lymphocytes Relative: 31 %
Lymphs Abs: 1.5 10*3/uL (ref 0.7–4.0)
MCH: 27.7 pg (ref 26.0–34.0)
MCHC: 32.8 g/dL (ref 30.0–36.0)
MCV: 84.4 fL (ref 80.0–100.0)
Monocytes Absolute: 0.3 10*3/uL (ref 0.1–1.0)
Monocytes Relative: 6 %
Neutro Abs: 3.1 10*3/uL (ref 1.7–7.7)
Neutrophils Relative %: 62 %
Platelets: 306 10*3/uL (ref 150–400)
RBC: 3.79 MIL/uL — ABNORMAL LOW (ref 4.22–5.81)
RDW: 13.3 % (ref 11.5–15.5)
WBC: 5 10*3/uL (ref 4.0–10.5)
nRBC: 0 % (ref 0.0–0.2)

## 2018-12-11 LAB — BASIC METABOLIC PANEL
Anion gap: 12 (ref 5–15)
BUN: 17 mg/dL (ref 6–20)
CO2: 23 mmol/L (ref 22–32)
Calcium: 8.3 mg/dL — ABNORMAL LOW (ref 8.9–10.3)
Chloride: 116 mmol/L — ABNORMAL HIGH (ref 98–111)
Creatinine, Ser: 0.6 mg/dL — ABNORMAL LOW (ref 0.61–1.24)
GFR calc Af Amer: >60 mL/min (ref >60)
GFR calc non Af Amer: >60 mL/min (ref >60)
Glucose, Bld: 87 mg/dL (ref 70–99)
Potassium: 3.3 mmol/L — ABNORMAL LOW (ref 3.5–5.1)
Sodium: 151 mmol/L — ABNORMAL HIGH (ref 135–145)

## 2018-12-11 LAB — GLUCOSE, CAPILLARY
Glucose-Capillary: 70 mg/dL (ref 70–99)
Glucose-Capillary: 82 mg/dL (ref 70–99)
Glucose-Capillary: 82 mg/dL (ref 70–99)
Glucose-Capillary: 88 mg/dL (ref 70–99)

## 2018-12-11 LAB — MAGNESIUM: Magnesium: 1.8 mg/dL (ref 1.7–2.4)

## 2018-12-11 LAB — LACTIC ACID, PLASMA: Lactic Acid, Venous: 1.6 mmol/L (ref 0.5–1.9)

## 2018-12-11 LAB — PHOSPHORUS: Phosphorus: 2.2 mg/dL — ABNORMAL LOW (ref 2.5–4.6)

## 2018-12-11 MED ORDER — HYDROCORTISONE NA SUCCINATE PF 100 MG IJ SOLR
50.0000 mg | Freq: Two times a day (BID) | INTRAMUSCULAR | Status: DC
  Administered 2018-12-11: 50 mg via INTRAVENOUS
  Filled 2018-12-11: qty 2

## 2018-12-11 MED ORDER — PHYTONADIONE 1 MG/0.5 ML ORAL SOLUTION
2.0000 mg | Freq: Once | ORAL | Status: DC
  Filled 2018-12-11: qty 1

## 2018-12-11 MED ORDER — ORAL CARE MOUTH RINSE
15.0000 mL | Freq: Two times a day (BID) | OROMUCOSAL | Status: DC
  Administered 2018-12-12 – 2018-12-21 (×20): 15 mL via OROMUCOSAL

## 2018-12-11 MED ORDER — HEPARIN SODIUM (PORCINE) 5000 UNIT/ML IJ SOLN
5000.0000 [IU] | Freq: Three times a day (TID) | INTRAMUSCULAR | Status: DC
  Filled 2018-12-11: qty 1

## 2018-12-11 MED ORDER — LEVOTHYROXINE SODIUM 25 MCG PO TABS
25.0000 ug | ORAL_TABLET | Freq: Every day | ORAL | Status: DC
  Administered 2018-12-12 – 2019-01-24 (×41): 25 ug
  Filled 2018-12-11 (×42): qty 1

## 2018-12-11 MED ORDER — VITAL 1.5 CAL PO LIQD
1000.0000 mL | ORAL | Status: DC
  Administered 2018-12-11 – 2018-12-13 (×3): 1000 mL
  Filled 2018-12-11 (×4): qty 1000

## 2018-12-11 MED ORDER — POLYETHYLENE GLYCOL 3350 17 G PO PACK
17.0000 g | PACK | Freq: Two times a day (BID) | ORAL | Status: DC
  Administered 2018-12-11 (×2): 17 g via ORAL
  Filled 2018-12-11 (×2): qty 1

## 2018-12-11 MED ORDER — CHLORHEXIDINE GLUCONATE 0.12 % MT SOLN
15.0000 mL | Freq: Two times a day (BID) | OROMUCOSAL | Status: DC
  Administered 2018-12-11 – 2018-12-21 (×22): 15 mL via OROMUCOSAL
  Filled 2018-12-11 (×18): qty 15

## 2018-12-11 MED ORDER — RINGERS IV SOLN: INTRAVENOUS | Status: DC

## 2018-12-11 MED ORDER — LACTATED RINGERS IV SOLN
INTRAVENOUS | Status: DC
  Administered 2018-12-11 – 2018-12-12 (×2): via INTRAVENOUS

## 2018-12-11 NOTE — Progress Notes (Signed)
Pharmacy Antibiotic Note  Aaron Vega is a 23 y.o. male admitted on 12/15/2018 with sepsis. Pharmacy has been consulted for cefepime dosing. Infectious sources include aspiration pneumonia. WBC are wnl at 5.0. His creatinine is labile, today at 0.59 (CrCl~205 ml/min). He is still febrile, Tmax over past 24 hours is 101.9. Initial CXR and CT on 11/14 shows possible pneumonia in left lower lobe, but there is no mention of pneumonia in further imaging. He is on 4 L [place]. He is on day 4 of antibiotics with no growth on any cultures.   Plan: Continue cefepime 2gm IV Q8H  Continue Metronidazole 500 q8h F/u renal fxn, C&S, and clinical status F/u de-escalation and length of therapy   Height: 5\' 11"  (180.3 cm) Weight: 298 lb (135.2 kg)(stated by mother) IBW/kg (Calculated) : 75.3  Temp (24hrs), Avg:101 F (38.3 C), Min:100.3 F (37.9 C), Max:101.6 F (38.7 C)  Recent Labs  Lab 12/11/2018 1425  12/09/18 0114  12/09/18 2202 12/10/18 0245 12/10/18 0250 12/10/18 0545 12/10/18 1107 12/10/18 1702 12/11/18 0659  WBC 9.1  --  5.6  --   --   --  4.6  --   --   --  5.0  CREATININE 1.18  --  0.56*  --   --   --  0.71  --   --   --  0.59*  LATICACIDVEN  --    < > 4.7*   < > 7.5* 8.1*  --  8.1* 7.2* 5.3*  --    < > = values in this interval not displayed.    Estimated Creatinine Clearance: 205.2 mL/min (A) (by C-G formula based on SCr of 0.59 mg/dL (L)).    No Known Allergies  Antimicrobials this admission: Vanc 11/14>>11/16 Cefepime 11/14>> Flagyl 11/14 >>  Dose adjustments this admission: N/A  Microbiology results: 11/14 bcx: ngtd 11/14 urine Cx: ng Covid neg MCR pcr neg   Thank you for allowing pharmacy to be a part of this patient's care.  Eddie Candle, PharmD PGY-1 Pharmacy Resident   Please check amion for clinical pharmacist contact number   12/11/2018 8:59 AM

## 2018-12-11 NOTE — Progress Notes (Addendum)
NEUROLOGY PROGRESS NOTE  Subjective: Patient currently is breathing on his own.  He is nonverbal at baseline.  Exam: Vitals:   12/11/18 1400 12/11/18 1500  BP: 118/88 116/77  Pulse: (!) 120 (!) 121  Resp: 18 (!) 23  Temp:    SpO2: 100% 100%    ROS Unable to obtain secondary to patient being nonverbal   Physical Exam  Constitutional: Appears well-developed and well-nourished.  Psych: Nonverbal Eyes: No scleral injection HENT: Congested but no OP obstruction Head: Normocephalic.  Cardiovascular: Tachycardic.  Respiratory: Effort normal, non-labored breathing GI: Soft.    Distended Skin: WDI   Neuro:  Mental Status: Resting comfortably, snoring, Cranial Nerves: II: Blinks to threat bilaterally III,IV, VI: If not opened eyes remain closed, pupils equal round reactive, when eyelids are pulled up patient has nonrhythmic-vertical nystagmus ( fast up-beating ) V,VII: Face symmetric, winces to pain Motor: Patient is significantly lethargic today.  Decreased tone in all 4 extremities.  Minimal withdrawal to noxious stimuli in the plantar surfaces. Sensory: Winces to noxious stimuli Deep Tendon Reflexes: No patellar or Achilles reflex but does have 1+ symmetric reflex in the bicep and brachioradialis Plantars: Mute bilaterally     Medications:  Scheduled: . chlorhexidine  15 mL Mouth Rinse BID  . Chlorhexidine Gluconate Cloth  6 each Topical Daily  . cloBAZam  60 mg Oral BID  . cyanocobalamin  1,000 mcg Subcutaneous Daily  . hydrocortisone sod succinate (SOLU-CORTEF) inj  50 mg Intravenous Q12H  . ipratropium-albuterol  3 mL Nebulization Q6H  . [START ON 12/12/2018] levothyroxine  25 mcg Per Tube Q0600  . mouth rinse  15 mL Mouth Rinse q12n4p  . nystatin   Topical Daily  . pantoprazole (PROTONIX) IV  40 mg Intravenous Q12H  . polyethylene glycol  17 g Oral BID  . sodium chloride flush  10-40 mL Intracatheter Q12H  . sodium chloride flush  3 mL Intravenous Q12H  .  sodium phosphate  1 enema Rectal Once   Continuous: . sodium chloride    . sodium chloride 100 mL/hr at 12/11/18 1500  . ceFEPime (MAXIPIME) IV Stopped (12/11/18 0756)  . levETIRAcetam Stopped (12/11/18 1056)  . metronidazole Stopped (12/11/18 0827)  . thiamine injection Stopped (12/11/18 1138)  . [START ON 12/13/2018] thiamine injection      Pertinent Labs/Diagnostics: B12 159 Thiamine pending Sodium 151 Creatinine 1.59 Protein 5.0 Albumin 2.6 AST 84 ALT 121 Copper level pending   EEG is suggestive of mild diffuse encephalopathy, nonspecific to etiology.  No seizures or epileptiform discharges noted  Mr Jeri Cos Wo Contrast  Result Date: 12/10/2018  IMPRESSION: Abnormal T2 signal and restricted diffusion in both medial thalami. 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 Electronically Signed   By: Nelson Chimes M.D.   On: 12/10/2018 17:16   Mr Cervical Spine W Wo Contrast  Result Date: 12/10/2018  IMPRESSION: Motion degraded exam.  No abnormality seen in the cervical region. Electronically Signed   By: Nelson Chimes M.D.   On: 12/10/2018 17:20    Etta Quill PA-C [facility] [phone]   Assessment: 23 year old autistic male with known epilepsy, nonverbal at baseline who presented with a 6-week history of rapid decline in cognition and gait in addition to new onset peripheral neuropathic symptoms since the onset of intractable nausea vomiting for approximately 6 weeks.  He has sustained an approximately 50 pound weight loss over this time. 1.  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  MRI findings also consistent with Wernicke's encephalopathy 3.  Severe B12 deficiency with level of 159.  This most likely is contributing to, or may be the sole  etiology of his bilateral extremity weakness as well as areflexia seen on exam. 4.  As noted prior AIDP or CIDP are possible etiologies for the bilateral extremity weakness and areflexia, but would be unlikely given proven B12 deficiency and high likelihood of acute thiamine deficiency. 5.  MRI of brain and cervical spine did not reveal any lesions to explain his bilateral lower extremity weakness and areflexia 6.  EEG did not show any epileptiform activity thus high likelihood this is not subclinical seizures.  Recommendations: -Continue to treat underlying infection and metabolic abnormalities -B12 injections 1000 mcg daily x7 days, followed by weekly for 1 month prior to rechecking levels and reassessing supplementation requirements -MRI of thoracic and lumbar spine -He has been started on high-dose thiamine 500 mg IV 3 times daily for 3 days then 250 mg IV 3 times daily for 3 days and at that point supplemental 100 mg daily thereafter -If MRI of T-spine and L-spine are negative may consider fluoroscopy guided LP to assess for possible albuminocytologic dissociation -Continue Onfi via tube and Keppra 1000 mg IV twice daily  Electronically signed: Dr. Caryl Pina 12/11/2018, 3:38 PM

## 2018-12-11 NOTE — Progress Notes (Signed)
 Initial Nutrition Assessment  DOCUMENTATION CODES:   Morbid obesity  INTERVENTION:   Tube Feeding:  Initiate Vital 1.5 at 20 ml/hr today Goal rate: Vital 1.5 at 60 ml/hr Pro-Stat 30 mL BID Provides 122 g of protein, 2360 kcals, 1094 mL of free water Meets 100% of estimated calorie and protein needs  Vitamin deficiencies being addressed by MD   NUTRITION DIAGNOSIS:   Inadequate oral intake related to acute illness as evidenced by NPO status.  GOAL:   Patient will meet greater than or equal to 90% of their needs  MONITOR:   TF tolerance, Diet advancement, Labs, Weight trends  REASON FOR ASSESSMENT:   Consult Enteral/tube feeding initiation and management, Assessment of nutrition requirement/status  ASSESSMENT:   23 yo male with autism and epilepsy with 6 week hx of rapid decline in cognition and gait in addition to new peripheral neuropathic symptoms since the onset of intractable N/V for 6 weeks. Per neurology, findings most consistent with acute thiamine deficiency, pt with B12 deficiency as well. Pt is nonverbal at baseline. Additional PMH includes fatty liver, HTN, pancreatitis, OSA  11/14 CT abdomen: dilated gas filled loops of small and large bowel, moderate stool burden 11/16 MRI brain: consistent with Wernicke's encephalopathy  Unable to obtain diet and weight history at this time  Noted palliative care has been consulted.   Small bore NG tube inserted by radiology; plan to start trickle TF today. Per abd xray, NG tube with tip in stomach. Possible ileus, obstipation per MD. Noted pt on bowel regimen at present  He has sustained approximately 50 pound weight loss in 6 weeks per report  Treating for B12 and Thiamine deficiencies; noted copper level pending B12: 159 (L) B1: pending Copper: pending  Labs: sodium 151 (H) Meds: B12 injection, IV thiamine, miralax daily, NS at 100 ml/hr  NUTRITION - FOCUSED PHYSICAL EXAM:  Unable to assess, working  remotely.   Diet Order:   Diet Order            Diet NPO time specified  Diet effective now              EDUCATION NEEDS:   Not appropriate for education at this time  Skin:  Skin Assessment: Reviewed RN Assessment  Last BM:  no documented BM  Height:   Ht Readings from Last 1 Encounters:  12/14/2018 5\' 11"  (1.803 m)    Weight:   Wt Readings from Last 1 Encounters:  12/16/2018 135.2 kg    Ideal Body Weight:  78.1 kg  BMI:  Body mass index is 41.56 kg/m.  Estimated Nutritional Needs:   Kcal:  2200-2500 kcals  Protein:  110-135 g  Fluid:  >/= 2 L    Aaron Merendino MS, RDN, LDN, CNSC [phone] Pager  [phone] Weekend/On-Call Pager

## 2018-12-11 NOTE — Progress Notes (Signed)
LTM EEG discontinued - no skin breakdown at unhook.   

## 2018-12-11 NOTE — Procedures (Addendum)
Patient Name: Aaron Vega  MRN: [id number]  Epilepsy Attending: Lora Havens  Referring Physician/Provider: Dr [name] Duration: 12/10/2018 1946 to 12/11/2018 1218  Patient history: 23 year old male with known epilepsy presented with altered mental status.  EEG to evaluate for seizures.    Level of alertness: Awake, asleep  AEDs during EEG study: Keppra, Onfi  Technical aspects: This EEG study was done with scalp electrodes positioned according to the 10-20 International system of electrode placement. Electrical activity was acquired at a sampling rate of 500Hz  and reviewed with a high frequency filter of 70Hz  and a low frequency filter of 1Hz . EEG data were recorded continuously and digitally stored.   Description: During awake state, no clear posterior dominant rhythm was seen.  Sleep was characterized by vertex waves, sleep spindles (12 to 14 Hz), maximal frontocentral.  EEG showed continuous generalized 5 to 6 Hz theta slowing admixed with an excessive amount of 15 to 18 Hz, 2-3 uV beta activity with irregular morphology distributed symmetrically and diffusely.  Hyperventilation and photic stimulation were not performed.  Abnormality -Continuous slow, generalized -Excessive beta, generalized      IMPRESSION: This study is suggestive of mild diffuse encephalopathy, nonspecific to etiology. No seizures or epileptiform discharges were seen throughout the recording.  The excessive beta activity seen in the background is most likely due to the effect of benzodiazepine and is a benign EEG pattern.  Evone Arseneau Barbra Sarks

## 2018-12-11 NOTE — Plan of Care (Signed)

## 2018-12-11 NOTE — Consult Note (Signed)
Consultation Note Date: 12/11/2018   Patient Name: Aaron Vega  DOB: 10/12/1995  MRN: [id number]  Age / Sex: 23 y.o., male  PCP: [facility] [place] Referring Physician: [name], MD  Reason for Consultation: Establishing goals of care and Psychosocial/spiritual support  HPI/Patient Profile: 23 y.o. male  with past medical history of autism, nonverbal at baseline, hypertension, obesity, fatty liver, sleep apnea not on CPAP, seizure disorder as of October 2017, prediabetes, pancreatitis at age 4, Tourette's syndrome, admitted on 12/19/2018 with acute on chronic respiratory distress in the setting of untreated obstructive sleep apnea, altered mental status, seizure activity, suspected Warnicke's encephalopathy, suspected sepsis.  Clinical Assessment and Goals of Care: I have reviewed medical records including EPIC notes, labs and imaging, received report from CCM NP, assessed the patient and then met at the bedside along with mother/legal guardian, Aaron, to discuss diagnosis prognosis, GOC, EOL wishes, disposition and options.  I introduced Palliative Medicine as specialized medical care for people living with serious illness. It focuses on providing relief from the symptoms and stress of a serious illness. The goal is to improve quality of life for both the patient and the family.  We discussed a brief life review of the patient. Aaron tells me that Aaron Vega is nonverbal, but was able to enjoy playing on his computer pad, able to participate in some simple games.  As far as functional and nutritional status, Aaron Vega had a good appetite, wanting to go to [place] daily for his drink.  Aaron states that he was able to self toilet, but she had to wipe him.   We discussed current illness and what it means in the larger context of on-going co-morbidities.  I share a diagram of the  chronic illness pathway, what is normal and expected.  Natural disease trajectory and expectations about recovery were discussed.   Aaron is able to share some of her concerns about Aaron Vega's ability to recover, what this would mean for him.  Aaron shares that she is reassured by my visit, and understands that Aaron Vega will be okay regardless of outcomes.  The Aaron Vega family is active with [facility] services, and is anticipated to continue the services outpatient.    Questions and concerns were addressed.  The family was encouraged to call with questions or concerns.  PMT to continue to follow, working on relationship building.  HCPOA    LEGAL GUARDIAN - Mother Aaron Vega, Aaron's mother Aaron Vega is very involved and source of support.    SUMMARY OF RECOMMENDATIONS   24 to 48 hours for outcomes Continue to treat the treatable, full scope treatment problem PMT to have CODE STATUS discussions after relationship building.  Code Status/Advance Care Planning:  Full code -CODE STATUS not discussed today.  PMT will continue CODE STATUS discussions as relationship building continues.  Symptom Management:   Per hospitalist/CCM, no additional needs at this time.  Palliative Prophylaxis:   Aspiration, Palliative Wound Care and Turn Reposition  Additional Recommendations (Limitations, Scope, Preferences):  Full Scope Treatment  Psycho-social/Spiritual:   Desire for further Chaplaincy support:no  Additional Recommendations: Caregiving  Support/Resources and Education on Hospice  Prognosis:   Unable to determine, based on outcomes.  6 months or less would not be surprising based on worsening functional status, acuity of illness, 3 hospital stays and 1 ED visit in the last 6 months.  Already active with [facility].  Discharge Planning: To be determined, based on outcomes.  Anticipate return to home regardless of improvement or lack thereof.       Primary Diagnoses: Present on Admission: . Severe sepsis ([place]) . Ileus ([place]) . Intractable nausea and vomiting . Constipation in male . Obesity, Class III, BMI 40-49.9 (morbid obesity) ([place]) . Autistic disorder . Aspiration pneumonia ([place])   I have reviewed the medical record, interviewed the patient and family, and examined the patient. The following aspects are pertinent.  Past Medical History:  Diagnosis Date  . Autistic disorder, current or active state   . Fatty liver   . Hypertension   . Obesity   . Pancreatitis    at age 37 years  . Pneumonia   . Pre-diabetes   . Seizures ([place])    as of 11/23/15 - no seizures for 3 month  . Sleep apnea    not on cpap (can't tolerate)  . Tics of organic origin   . Tourette syndrome    Social History   Socioeconomic History  . Marital status: Single    Spouse name: Not on file  . Number of children: Not on file  . Years of education: Not on file  . Highest education level: Not on file  Occupational History  . Not on file  Social Needs  . Financial resource strain: Not on file  . Food insecurity    Worry: Not on file    Inability: Not on file  . Transportation needs    Medical: Not on file    Non-medical: Not on file  Tobacco Use  . Smoking status: Never Smoker  . Smokeless tobacco: Never Used  Substance and Sexual Activity  . Alcohol use: Never    Frequency: Never  . Drug use: Never  . Sexual activity: Not on file  Lifestyle  . Physical activity    Days per week: Not on file    Minutes per session: Not on file  . Stress: Not on file  Relationships  . Social Herbalist on phone: Not on file    Gets together: Not on file    Attends religious service: Not on file    Active member of club or organization: Not on file    Attends meetings of clubs or organizations: Not on file    Relationship status: Not on file  Other Topics Concern  . Not on file  Social History Narrative  . Not on file   Family  History  Problem Relation Age of Onset  . Seizures Maternal Aunt   . Anxiety disorder Paternal Uncle   . Depression Paternal Uncle   . Bipolar disorder Paternal Uncle   . Seizures Cousin        Maternal 1st and 2nd cousins have seizures  . Anxiety disorder Other        Paternal fhx  . Depression Other        Paternal fhx  . Hypertension Other        Paternal fhx  . Bipolar disorder Other        Paternal fhx  Scheduled Meds: . Chlorhexidine Gluconate Cloth  6 each Topical Daily  . cloBAZam  60 mg Oral BID  . cyanocobalamin  1,000 mcg Subcutaneous Daily  . hydrocortisone sod succinate (SOLU-CORTEF) inj  50 mg Intravenous Q12H  . ipratropium-albuterol  3 mL Nebulization Q6H  . [START ON 12/12/2018] levothyroxine  25 mcg Per Tube Q0600  . nystatin   Topical Daily  . pantoprazole (PROTONIX) IV  40 mg Intravenous Q12H  . polyethylene glycol  17 g Oral BID  . sodium chloride flush  10-40 mL Intracatheter Q12H  . sodium chloride flush  3 mL Intravenous Q12H  . sodium phosphate  1 enema Rectal Once   Continuous Infusions: . sodium chloride    . sodium chloride 100 mL/hr at 12/11/18 1200  . ceFEPime (MAXIPIME) IV Stopped (12/11/18 0756)  . levETIRAcetam Stopped (12/11/18 1056)  . metronidazole Stopped (12/11/18 0827)  . thiamine injection Stopped (12/11/18 1138)  . [START ON 12/13/2018] thiamine injection     PRN Meds:.sodium chloride, acetaminophen, bisacodyl, bisacodyl, lip balm, LORazepam, LORazepam, metoprolol tartrate, ondansetron (ZOFRAN) IV, sodium chloride flush, sodium chloride flush Medications Prior to Admission:  Prior to Admission medications   Medication Sig Start Date End Date Taking? Authorizing Provider  acetaminophen (TYLENOL) 650 MG suppository Place 650 mg rectally every 6 (six) hours as needed for mild pain or fever.  11/05/18  Yes [provider]  citalopram (CELEXA) 40 MG tablet Take 40 mg by mouth daily. 04/24/14  Yes [provider]   cloBAZam (ONFI) 20 MG tablet Take 60 mg by mouth 2 (two) times daily. 10/16/18  Yes [provider]  diazepam (DIASAT) 20 MG GEL Place 20 mg rectally daily as needed (for seizures lasting more than 5 minutes.).  07/03/13  Yes [provider]  diazepam (VALIUM) 5 MG tablet Take 7.5 mg by mouth every morning. May give 0.5 tablet (2.21m) as needed for agitation/anxiety 10/15/18  Yes [provider]  levothyroxine (SYNTHROID) 25 MCG tablet Take 1 tablet (25 mcg total) by mouth daily before breakfast. 11/25/18  Yes NMariel Aloe MD  lisinopril (PRINIVIL,ZESTRIL) 20 MG tablet Take 20 mg by mouth daily. Take with 548mtablet for a total dose of 253m/22/16  Yes [provider]  lisinopril (ZESTRIL) 5 MG tablet Take 5 mg by mouth daily. Take with 32m54mblet for a total dose of 25mg12m0/20  Yes [provider]  Melatonin 5 MG TABS Take 5 mg by mouth at bedtime.   Yes [provider]  Midazolam (NAYZILAM) 5 MG/0.1ML SOLN Place 5 mg into the nose daily as needed (seizure).   Yes [provider]  nystatin (MYCOSTATIN/NYSTOP) 100000 UNIT/GM POWD Apply 1 application topically daily. Neck, crevices, etc. 04/23/14  Yes [provider]  omeprazole (PRILOSEC) 40 MG capsule Take 1 capsule (40 mg total) by mouth 2 (two) times daily. Open capsule and sprinkle into applesauce or yoghurt. 11/27/18  Yes NetteMariel Aloe ondansetron (ZOFRAN ODT) 4 MG disintegrating tablet Take 1 tablet (4 mg total) by mouth every 8 (eight) hours as needed for nausea or vomiting. 11/25/18  Yes NetteMariel Aloe polyethylene glycol (MIRALAX) 17 g packet Take 17 g by mouth 2 (two) times daily. Patient taking differently: Take 17 g by mouth daily as needed for mild constipation.  11/25/18  Yes NetteMariel Aloe promethazine (PHENERGAN) 25 MG suppository Place 1 suppository (25 mg total) rectally every 6 (six) hours as needed for nausea or  vomiting. 11/12/18  Yes  Carmon Sails J, PA-C  sucralfate (CARAFATE) 1 GM/10ML suspension Take 10 mLs (1 g total) by mouth 4 (four) times daily -  with meals and at bedtime. 11/27/18  Yes Mariel Aloe, MD  linaclotide Rolan Lipa) 290 MCG CAPS capsule Take 1 capsule (290 mcg total) by mouth daily before breakfast. Patient not taking: Reported on 12/07/2018 11/26/18   Mariel Aloe, MD  MILK OF MAGNESIA 400 MG/5ML suspension Take 30-60 mLs by mouth daily as needed for constipation. 12/06/18   [provider]  zonisamide (ZONEGRAN) 100 MG capsule Take 100-200 mg by mouth See admin instructions. Take one capsule every other night for two weeks, then take on capsule every night for two weeks, then take 2100m every night. 11/27/18   [provider]   No Known Allergies Review of Systems  Unable to perform ROS: Patient nonverbal    Physical Exam Vitals signs and nursing note reviewed.  Constitutional:      General: He is not in acute distress.    Appearance: He is obese. He is ill-appearing.  Cardiovascular:     Rate and Rhythm: Normal rate.  Pulmonary:     Effort: Pulmonary effort is normal. No respiratory distress.     Breath sounds: Rhonchi present.  Abdominal:     General: Abdomen is flat. There is distension.     Palpations: Abdomen is soft.     Tenderness: There is no abdominal tenderness.  Musculoskeletal:        General: Swelling present.  Skin:    General: Skin is warm and dry.     Vital Signs: BP 114/80 (BP Location: Right Wrist)   Pulse (!) 116   Temp 100.3 F (37.9 C) (Axillary)   Resp (!) 24   Ht _0  (1.803 m)   Wt 135.2 kg Comment: stated by mother  SpO2 100%   BMI 41.56 kg/m  Pain Scale: CPOT   Pain Score: 0-No pain   SpO2: SpO2: 100 % O2 Device:SpO2: 100 % O2 Flow Rate: .O2 Flow Rate (L/min): 3 L/min  IO: Intake/output summary:   Intake/Output Summary (Last 24 hours) at 12/11/2018 1456 Last data filed at 12/11/2018 1200 Gross per 24 hour  Intake  4163.84 ml  Output 5325 ml  Net -1161.16 ml    LBM: Last BM Date: 11/30/18(per mother) Baseline Weight: Weight: (!) 148.8 kg Most recent weight: Weight: 135.2 kg(stated by mother)     Palliative Assessment/Data:   Flowsheet Rows     Most Recent Value  Intake Tab  Referral Department  Hospitalist  Unit at Time of Referral  ICU  Palliative Care Primary Diagnosis  Sepsis/Infectious Disease  Date Notified  12/10/18  Palliative Care Type  New Palliative care  Reason for referral  Clarify Goals of Care, Psychosocial or Spiritual support  Date of Admission  12/09/2018  Date first seen by Palliative Care  12/11/18  # of days Palliative referral response time  1 Day(s)  # of days IP prior to Palliative referral  2  Clinical Assessment  Palliative Performance Scale Score  40%  Pain Max last 24 hours  Not able to report  Pain Min Last 24 hours  Not able to report  Dyspnea Max Last 24 Hours  Not able to report  Dyspnea Min Last 24 hours  Not able to report  Psychosocial & Spiritual Assessment  Palliative Care Outcomes      Time In: 1005 Time Out: 1115 Time Total: 70 minutes  Greater than 50%  of this time was spent counseling and coordinating care related to the above assessment and plan.  Signed by: Drue Novel, NP   Please contact Palliative Medicine Team phone at [phone] for questions and concerns.  For individual provider: See Shea Evans

## 2018-12-11 NOTE — Procedures (Addendum)
Patient Name: Lark Runk  MRN: [id number]  Epilepsy Attending: Lora Havens  Referring Physician/Provider: Dr [name] Date: 12/11/2018 Duration: 21.32 mins  Patient history: 23 year old male with known epilepsy presented with altered mental status.  EEG to evaluate for seizures.    Level of alertness: Awake, asleep  AEDs during EEG study: Keppra, Onfi  Technical aspects: This EEG study was done with scalp electrodes positioned according to the 10-20 International system of electrode placement. Electrical activity was acquired at a sampling rate of 500Hz  and reviewed with a high frequency filter of 70Hz  and a low frequency filter of 1Hz . EEG data were recorded continuously and digitally stored.   Description: During awake state, no clear posterior dominant rhythm was seen.  Sleep was characterized by vertex waves, maximal frontocentral.  EEG showed continuous generalized 5 to 6 Hz theta slowing admixed with an excessive amount of 15 to 18 Hz, 2-3 uV beta activity with irregular morphology distributed symmetrically and diffusely.  Hyperventilation and photic stimulation were not performed.  Abnormality -Continuous slow, generalized -Excessive beta, generalized      IMPRESSION: This study is suggestive of mild diffuse encephalopathy, nonspecific to etiology. No seizures or epileptiform discharges were seen throughout the recording.  The excessive beta activity seen in the background is most likely due to the effect of benzodiazepine and is a benign EEG pattern.  Shahira Fiske Barbra Sarks

## 2018-12-11 NOTE — Progress Notes (Addendum)
NAME:  Aaron Vega, MRN:  [id number], DOB:  1995-08-04, LOS: 3 ADMISSION DATE:  12/19/18, CONSULTATION DATE:  12/10/2018 REFERRING MD:  Earnest Conroy - TRH, CHIEF COMPLAINT:  Rising lactate level.  Brief History   23 year old autistic man with abdominal distension and vomiting. Increasing lactic acidosis.   History of present illness   Known for poor and declining health status from severe autism with seizures, severe constipation recently.  His QoL was significantly better prior to 08/2018 when he suffered a severe seizure at home and struck his head. Seen in ED only but has has worsening mental status since. Per chart, this was ascribed to  hypoxic ischemic encephalopathy following prolonged seizure.   Presented via ED for increasing lethargy and vomiting and decreased urine output.   Significant disconnect regarding goals of care as currently receiving hospice care at home but still full code and mother not yet willing to place limits on care.  Unable to place NGTx2. Increasing respiratory distress and worsening lactic acidosis in spite of fluid resuscitation and normal blood pressure.  Past Medical History   Past Medical History:  Diagnosis Date  . Autistic disorder, current or active state   . Fatty liver   . Hypertension   . Obesity   . Pancreatitis    at age 40 years  . Pneumonia   . Pre-diabetes   . Seizures ([place])    as of 11/23/15 - no seizures for 3 month  . Sleep apnea    not on cpap (can't tolerate)  . Tics of organic origin   . Tourette syndrome    Past Surgical History:  Procedure Laterality Date  . BIOPSY  11/08/2018   Procedure: BIOPSY;  Surgeon: Jerene Bears, MD;  Location: [facility];  Service: Gastroenterology;;  . ESOPHAGOGASTRODUODENOSCOPY (EGD) WITH PROPOFOL N/A 11/08/2018   Procedure: ESOPHAGOGASTRODUODENOSCOPY (EGD) WITH PROPOFOL;  Surgeon: Jerene Bears, MD;  Location: [facility];  Service: Gastroenterology;  Laterality: N/A;  . FLEXIBLE  SIGMOIDOSCOPY N/A 11/21/2018   Procedure: FLEXIBLE SIGMOIDOSCOPY;  Surgeon: Irving Copas., MD;  Location: [facility];  Service: Gastroenterology;  Laterality: N/A;  . IMPACTION REMOVAL  11/21/2018   Procedure: IMPACTION REMOVAL;  Surgeon: Irving Copas., MD;  Location: [facility];  Service: Gastroenterology;;  . RADIOLOGY WITH ANESTHESIA N/A 11/24/2015   Procedure: CT SCAN ABDOMEN AND PELVIS WITH CONTRAST;  Surgeon: Medication Radiologist, MD;  Location: [place];  Service: Radiology;  Laterality: N/A;  . TONSILLECTOMY     and adenoidectomy    Significant Hospital Events   11/14 admission to [facility]. 11/15 increasing lactate despite fluids. 12/10/2018 transfer to intensive care unit  Consults:  PCCM 11/16. Urology 12/10/2018  Procedures:  11/09/2018 NG tube placed per interventional radiology  Significant Diagnostic Tests:  11/14 - CT abdomen/pelvis: obese, small LLL infiltrate, dilated gas filled loops of small and large bowel to rectum, no transition. No gastric distension, moderate stool burden in ascending colon. 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  Mother is at the bedside states he is nonverbal at baseline.  Objective   Blood pressure 90/60, pulse (!) 119, temperature 100.3 F (37.9 C), temperature source Axillary, resp. rate (!) 22, height 5\' 11"  (1.803 m), weight 135.2 kg, SpO2 96 %.  Intake/Output Summary (Last 24 hours) at 12/11/2018 0814 Last data filed at 12/11/2018 0600 Gross per 24 hour  Intake 3084.64 ml  Output 7725 ml  Net -4640.36 ml   Filed Weights   12/07/2018 1402 12/19/2018 1418  Weight: (!) 148.8 kg  135.2 kg    Examination: General: Overweight 23 year old male who is nonverbal secondary to autism HEENT: Short thick neck pupils equal react to light Neuro: Does not follow commands.  Is nonverbal.  Approaching normal baseline per mother CV: Heart sounds regular regular rate rhythm sinus tach 104 PULM: Coarse rhonchi, upper airway obstruction GI: Soft faint bowel sounds Extremities: warm/dry, 1+ edema  Skin: no rashes or lesions   Resolved Hospital Problem list   None  Assessment & Plan:  Acute on chronic respiratory distress in the setting of untreated obstructive sleep apnea receiving sedation for multiple treatments.  Nontolerant noninvasive mechanical ventilatory support secondary to autism.  Note arterial blood gases 12/10/2018 demonstrated a PCO2 of 32 with a normal pH not consistent with chronic CO2 retention. Suspected aspiration Plan: Minimize sedation as tolerated Mobilize as tolerated He has been unable to tolerate noninvasive mechanical ventilatory support secondary to autism Continue empirical antimicrobial therapy if intubated sputum culture.  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 Plan: Appreciate neurology's input Currently on high-dose thiamine vitamin B Ongoing testing with MRI of thoracic spine planned Questionable LP in the future Continue antiseizure medication  Suspected sepsis with procalcitonin 0.36, lactic acid of 7 suspected small bowel obstruction versus ileus.  Shock now resolved.  Questionable component of aspiration pneumonia. Plan: Continue cefepime and Flagyl currently on day 2 Continue to monitor micro data note urine culture 1114 was negative, blood cultures 1114 showed no growth to date Continue IV fluids at 100 Recheck lactic acid to ensure improvement  50 pound weight loss with current small bowel obstruction versus ileus Plan: Attempt trickle tube feeds Continue  to monitor for bowel obstruction Continue IV fluids at 100 Resume MiraLAX on a twice daily basis for chronic constipation Start trickle tube feedings after obtaining nutritional consult place NG tube to gravity suction until tube feeding started   Daily Goals Checklist  Pain/Anxiety/Delirium protocol (if indicated): none, prn lorazepam for seizures. VAP protocol (if indicated): N/A Respiratory support goals: continue [place]. Blood pressure target: allow autoregulation. DVT prophylaxis: UFH tid Nutritional status and feeding goals: initiate NGT feeds once stomach decompressed. Progress slowly starting with trickle feeds. GI prophylaxis: Protonix bid for esophagitis Fluid status goals: continue fluid resuscitation, likely total body water dehydration.  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 Monitor when start feeding. Mobility/therapy needs: bedrest for now. Antibiotic de-escalation: Continue current empiric antibiotics Home medication reconciliation: Restart Omfi once enteral access available Daily labs: BMP daily Code Status: FULL this was confirmed 12/11/2018 with mother Family Communication: 12/11/2018 mother updated at the bedside. Disposition: Transfer to ICU.  Labs   CBC: Recent Labs  Lab 12/04/2018 1425 12/09/18 0114 12/10/18 0250  WBC 9.1 5.6 4.6  NEUTROABS 6.4 3.9 2.8  HGB 14.0 12.4* 10.9*  HCT 42.3 37.3* 33.3*  MCV 82.1 82.9 84.5  PLT 603* 358 331    Basic Metabolic Panel: Recent Labs  Lab 12/01/2018 1425 12/09/18 0114 12/10/18 0250 12/11/18 0659  NA 132* 135 137 151*  K 3.1* 3.5 4.0 3.7  CL 88* 98 104 111  CO2 23 23 16* 26  GLUCOSE 137* 107* 90 82  BUN  19 14 10 17   CREATININE 1.18 0.56* 0.71 0.59*  CALCIUM 9.5 8.5* 8.4* 8.4*   GFR: Estimated Creatinine Clearance: 205.2 mL/min (A) (by C-G formula based on SCr of 0.59 mg/dL (L)). Recent Labs  Lab 01-04-2019 1425  12/09/18  0114  12/10/18 0245 12/10/18 0250 12/10/18 0545 12/10/18 1107 12/10/18 1702  PROCALCITON  --   --  0.36  --   --   --   --   --   --   WBC 9.1  --  5.6  --   --  4.6  --   --   --   LATICACIDVEN  --    < > 4.7*   < > 8.1*  --  8.1* 7.2* 5.3*   < > = values in this interval not displayed.    Liver Function Tests: Recent Labs  Lab 2019-01-04 1425 12/09/18 0114 12/10/18 0250 12/11/18 0659  AST 93* 71* 69* 84*  ALT 159* 124* 109* 121*  ALKPHOS 49 39 35* 32*  BILITOT 1.1 1.3* 0.8 1.2  PROT 6.5 5.6* 5.4* 5.0*  ALBUMIN 3.5 2.9* 2.5* 2.6*   No results for input(s): LIPASE, AMYLASE in the last 168 hours. No results for input(s): AMMONIA in the last 168 hours.  ABG    Component Value Date/Time   PHART 7.355 12/10/2018 0912   PCO2ART 31.7 (L) 12/10/2018 0912   PO2ART 97.5 12/10/2018 0912   HCO3 16.8 (L) 12/10/2018 0912   ACIDBASEDEF 7.3 (H) 12/10/2018 0912   O2SAT 97.2 12/10/2018 0912     Coagulation Profile: Recent Labs  Lab 04-Jan-2019 1425 12/09/18 0114 12/09/18 2252  INR 2.9* 2.8* 3.1*    Cardiac Enzymes: No results for input(s): CKTOTAL, CKMB, CKMBINDEX, TROPONINI in the last 168 hours.  HbA1C: No results found for: HGBA1C  CBG: Recent Labs  Lab 12/09/18 1122 12/10/18 2024 12/10/18 2336 12/11/18 0313  GLUCAP 99 87 75 82     Steve Minor ACNP 12/13/18 PCCM

## 2018-12-11 NOTE — Progress Notes (Addendum)
[facility] Liaison note. Chart notes reviewed, noted move to ICU for increased needs. NG placed yesterday with plan for initiation of trickle feeds.  Patient remains on IV antibiotics, steroids, fluids, Keppra and IV thiamine.  Note referral to hospital Palliative Care team. Telephone call placed to patient's mother, unable to leave a message. Will continue to follow and anticipate discharge needs as well as update the hospice team.  Please call with any hospice questions Flo Shanks BSN, RN, [place] [phone]

## 2018-12-11 NOTE — Plan of Care (Signed)
  Problem: Clinical Measurements: Goal: Ability to maintain clinical measurements within normal limits will improve Outcome: Progressing Goal: Diagnostic test results will improve Outcome: Progressing Note: Lactic Acid trending down Goal: Respiratory complications will improve Outcome: Progressing   Problem: Elimination: Goal: Will not experience complications related to urinary retention Outcome: Progressing   Problem: Skin Integrity: Goal: Risk for impaired skin integrity will decrease Outcome: Progressing   Problem: Activity: Goal: Risk for activity intolerance will decrease Outcome: Not Progressing Note: Pt is bedbound    Problem: Nutrition: Goal: Adequate nutrition will be maintained Outcome: Not Progressing Note: Pt has been NPO. NG is in place. RN waiting for dietary to evaluate for tube feed

## 2018-12-12 ENCOUNTER — Inpatient Hospital Stay (HOSPITAL_COMMUNITY)

## 2018-12-12 DIAGNOSIS — Z7189 Other specified counseling: Secondary | ICD-10-CM | POA: Diagnosis not present

## 2018-12-12 DIAGNOSIS — F840 Autistic disorder: Secondary | ICD-10-CM | POA: Diagnosis not present

## 2018-12-12 DIAGNOSIS — R9431 Abnormal electrocardiogram [ECG] [EKG]: Secondary | ICD-10-CM

## 2018-12-12 DIAGNOSIS — R4182 Altered mental status, unspecified: Secondary | ICD-10-CM | POA: Diagnosis not present

## 2018-12-12 DIAGNOSIS — R6520 Severe sepsis without septic shock: Secondary | ICD-10-CM | POA: Diagnosis not present

## 2018-12-12 DIAGNOSIS — K5900 Constipation, unspecified: Secondary | ICD-10-CM | POA: Diagnosis not present

## 2018-12-12 DIAGNOSIS — A419 Sepsis, unspecified organism: Secondary | ICD-10-CM | POA: Diagnosis not present

## 2018-12-12 LAB — GLUCOSE, CAPILLARY
Glucose-Capillary: 80 mg/dL (ref 70–99)
Glucose-Capillary: 90 mg/dL (ref 70–99)
Glucose-Capillary: 104 mg/dL — ABNORMAL HIGH (ref 70–99)
Glucose-Capillary: 113 mg/dL — ABNORMAL HIGH (ref 70–99)
Glucose-Capillary: 113 mg/dL — ABNORMAL HIGH (ref 70–99)
Glucose-Capillary: 102 mg/dL — ABNORMAL HIGH (ref 70–99)

## 2018-12-12 LAB — ECHOCARDIOGRAM COMPLETE
Height: 71 in
Weight: 4768 oz

## 2018-12-12 LAB — PROTIME-INR
INR: 3.6 — ABNORMAL HIGH (ref 0.8–1.2)
Prothrombin Time: 35.6 seconds — ABNORMAL HIGH (ref 11.4–15.2)

## 2018-12-12 LAB — PHOSPHORUS
Phosphorus: 2.6 mg/dL (ref 2.5–4.6)
Phosphorus: 3.1 mg/dL (ref 2.5–4.6)

## 2018-12-12 LAB — MAGNESIUM
Magnesium: 1.8 mg/dL (ref 1.7–2.4)
Magnesium: 1.5 mg/dL — ABNORMAL LOW (ref 1.7–2.4)

## 2018-12-12 IMAGING — DX DG ABD PORTABLE 1V
1 series · 1 of 1 positions shown · non-contrast
Comparison: Radiograph dated [DATE]

CLINICAL DATA: NG tube placement.

EXAM:
PORTABLE ABDOMEN - 1 VIEW

[abdomen kub]
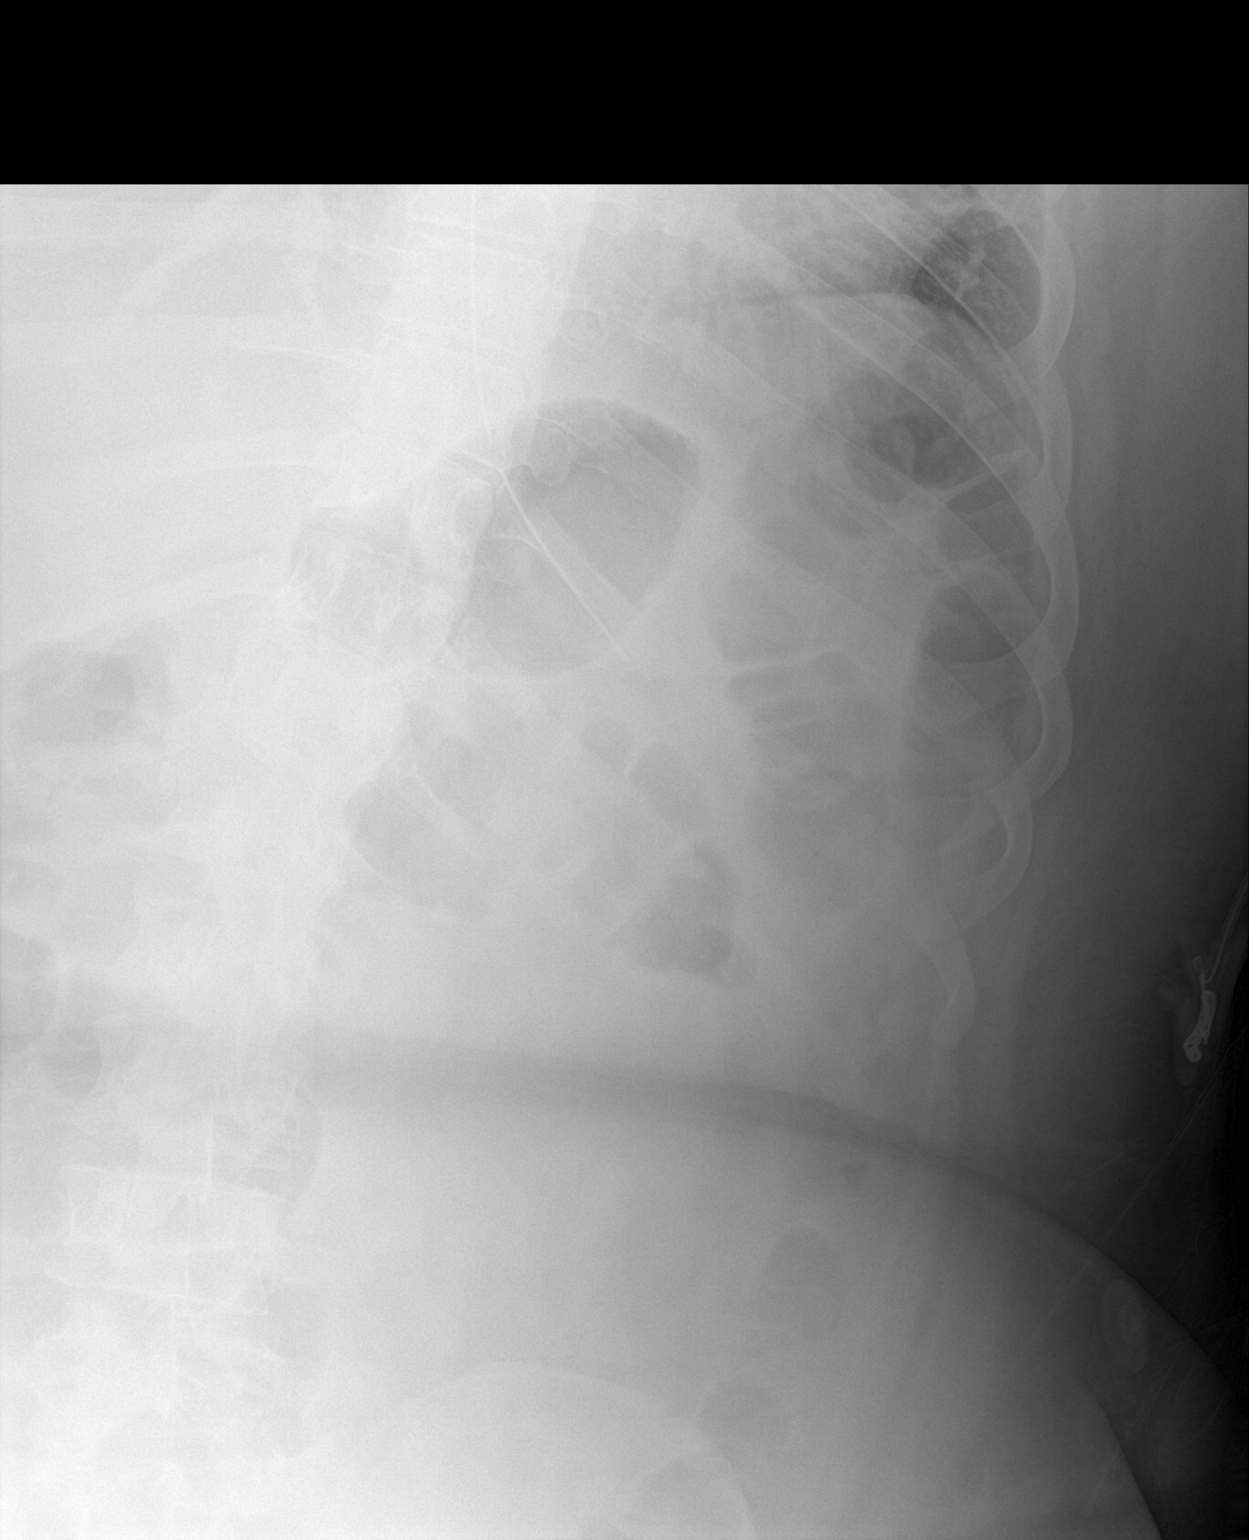

[1 of 1 positions shown; findings below may reference images not displayed]

FINDINGS: NG tube has been inserted and the tip is in the midbody of the
stomach. The visualized bowel gas pattern is normal. No visible
acute bone abnormality.
IMPRESSION: NG tube in the body of the stomach.

## 2018-12-12 MED ORDER — POLYETHYLENE GLYCOL 3350 17 G PO PACK
17.0000 g | PACK | Freq: Two times a day (BID) | ORAL | Status: DC
  Administered 2018-12-12 – 2018-12-20 (×17): 17 g
  Filled 2018-12-12 (×19): qty 1

## 2018-12-12 MED ORDER — CLOBAZAM 10 MG PO TABS
60.0000 mg | ORAL_TABLET | Freq: Two times a day (BID) | ORAL | Status: DC
  Administered 2018-12-12 – 2019-01-26 (×88): 60 mg
  Filled 2018-12-12 (×89): qty 6

## 2018-12-12 MED ORDER — POTASSIUM CHLORIDE 10 MEQ/50ML IV SOLN: 10.0000 meq | INTRAVENOUS | Status: DC

## 2018-12-12 MED ORDER — VITAMIN B-1 100 MG PO TABS
100.0000 mg | ORAL_TABLET | Freq: Every day | ORAL | Status: DC
  Administered 2018-12-17 – 2018-12-21 (×5): 100 mg
  Filled 2018-12-12 (×6): qty 1

## 2018-12-12 MED ORDER — ACETAMINOPHEN 160 MG/5ML PO SOLN
650.0000 mg | ORAL | Status: AC | PRN
  Administered 2018-12-13 – 2018-12-14 (×3): 650 mg
  Filled 2018-12-12 (×3): qty 20.3

## 2018-12-12 MED ORDER — IPRATROPIUM-ALBUTEROL 0.5-2.5 (3) MG/3ML IN SOLN
3.0000 mL | Freq: Four times a day (QID) | RESPIRATORY_TRACT | Status: DC | PRN
  Administered 2018-12-12 – 2018-12-26 (×2): 3 mL via RESPIRATORY_TRACT
  Filled 2018-12-12 (×2): qty 3

## 2018-12-12 MED ORDER — THIAMINE HCL 100 MG/ML IJ SOLN
250.0000 mg | Freq: Three times a day (TID) | INTRAVENOUS | Status: AC
  Administered 2018-12-14 – 2018-12-16 (×8): 250 mg via INTRAVENOUS
  Filled 2018-12-12 (×11): qty 2.5

## 2018-12-12 MED ORDER — SENNOSIDES 8.8 MG/5ML PO SYRP
5.0000 mL | ORAL_SOLUTION | Freq: Two times a day (BID) | ORAL | Status: DC
  Administered 2018-12-12 – 2018-12-22 (×19): 5 mL
  Filled 2018-12-12 (×22): qty 5

## 2018-12-12 MED ORDER — PHYTONADIONE 1 MG/0.5 ML ORAL SOLUTION
2.0000 mg | Freq: Once | ORAL | Status: AC
  Administered 2018-12-12: 2 mg via ORAL
  Filled 2018-12-12: qty 1

## 2018-12-12 MED ORDER — PERFLUTREN LIPID MICROSPHERE
1.0000 mL | INTRAVENOUS | Status: AC | PRN
  Administered 2018-12-12: 5 mL via INTRAVENOUS
  Filled 2018-12-12: qty 10

## 2018-12-12 MED ORDER — POTASSIUM CHLORIDE 10 MEQ/100ML IV SOLN
10.0000 meq | INTRAVENOUS | Status: AC
  Administered 2018-12-12 (×3): 10 meq via INTRAVENOUS
  Filled 2018-12-12 (×2): qty 100

## 2018-12-12 MED ORDER — PANTOPRAZOLE SODIUM 40 MG PO PACK
40.0000 mg | PACK | Freq: Two times a day (BID) | ORAL | Status: DC
  Administered 2018-12-12 – 2019-01-10 (×57): 40 mg
  Filled 2018-12-12 (×57): qty 20

## 2018-12-12 MED ORDER — SODIUM CHLORIDE 0.9 % IV SOLN
6.0000 g | Freq: Once | INTRAVENOUS | Status: AC
  Administered 2018-12-12: 6 g via INTRAVENOUS
  Filled 2018-12-12: qty 10

## 2018-12-12 MED ORDER — DEXTROSE 5 % IV SOLN
INTRAVENOUS | Status: DC
  Administered 2018-12-12 (×2): via INTRAVENOUS

## 2018-12-12 MED FILL — Perflutren Lipid Microsphere IV Susp 1.1 MG/ML: INTRAVENOUS | Qty: 10 | Status: AC

## 2018-12-12 NOTE — Progress Notes (Signed)
Pt restless, slightly tachypnic, congested but very weak cough.  Attempted oropharyngeal suction w/ minimal improvement.  Spoke w/ RT to request neb tx.

## 2018-12-12 NOTE — Progress Notes (Signed)
Pt came down for MRI spine imaging without and with contrast.  Pt was unable to cooperate for exam even with giving Ativan, pt kept lifting his head every minute or two and acted like his airway would get blocked laying flat on his back.  Unable to obtain any diagnostic images, would possibly be better airway protection if pt was done under anesthesia.  RN to contact physician to decide the best way to proceed.  Exams will most likely take at least 90-120 minutes to complete for both studies.

## 2018-12-12 NOTE — Progress Notes (Signed)
[place] Progress Note Patient Name: Aaron Vega DOB: Apr 11, 1995 MRN: [id number]   Date of Service  12/12/2018  HPI/Events of Note    eICU Interventions  Tylenol supp d/c'd, Tylenol liquid 650 mg Q 4 hours prn x 2 days.        [name] 12/12/2018, 11:56 PM

## 2018-12-12 NOTE — Progress Notes (Signed)
  Echocardiogram 2D Echocardiogram has been performed with Definity.  Aaron Vega 12/12/2018, 12:47 PM

## 2018-12-12 NOTE — Progress Notes (Addendum)
Palliative: Aaron Vega is lying quietly in bed.  Aaron Vega is sleepy but will briefly open his eyes and look in my direction.  Aaron Vega is nonverbal at baseline.  His mother Aaron Vega is at bedside.  We talk about Aaron Vega's acute health concerns in detail.  Aaron Vega states multiple times that this is difficult for her, she shares her concern over Aaron Vega's ability to recover. Aaron Vega shares that she does not understand how so many things could go so wrong, all at one time.  We talked about Aaron Vega's constipation, his poor by mouth intake, vitamin deficiency and confusion.  We also talked about likely aspiration pneumonia.    We talk briefly about CODE STATUS.  At this point family remains full code/full scope.  I asked Aaron Vega to consider if Aaron Vega does need life support, for how long?  Would she accept tracheostomy.  I reassure her that she does not have to make this choice right now, but it is better to consider the what if's and maybe's.  Conference with bedside nursing staff related to patient condition, needs.  PMT to continue to follow.  Follow-up Friday.  Plan:   Continue to treat the treatable, 24 to 48 hours for outcomes.  CODE STATUS discussed today, at this point full scope full code, considering if family would accept long-term life support.  2 minutes Aaron Axe, NP Palliative Medicine Team Team Phone # [phone] Greater than 50% of this time was spent counseling and coordinating care related to the above assessment and plan.

## 2018-12-12 NOTE — Progress Notes (Signed)
[facility] Liaison note Writer spoke via telephone to patient's mother [name] and to his grandmother Manuela Schwartz to provide support and let her know that the hospital liaison was following and was available for any questions/concerns needs. She reports that Rylend has been sleeping most of the time and that his fever continues to spike. She met again this morning with Palliative NP Quinn Axe and is very appreciative of the support she is receiving. Writer's contact information given to [place] and Manuela Schwartz. Will continue to follow and update hospice team. Flo Shanks BSN, RN, [place] [phone]

## 2018-12-12 NOTE — Progress Notes (Signed)
NG tube dislodged slightly, did not fully come out. Stopped tube feed. Replaced and ordered abd xray. Notified Elink and asked to review xray to advise on whether or not it is good to use again. Patient maintained sats and vital signs stable. No obvious distress. Will continue to monitor.

## 2018-12-12 NOTE — Progress Notes (Signed)
NAME:  Aaron Vega, MRN:  [id number], DOB:  12-Nov-1995, LOS: 4 ADMISSION DATE:  12-19-2018, CONSULTATION DATE:  12/10/2018 REFERRING MD:  Earnest Conroy - TRH, CHIEF COMPLAINT:  Rising lactate level.  Brief History   23 year old autistic man with abdominal distension and vomiting. Increasing lactic acidosis.   History of present illness   Known for poor and declining health status from severe autism with seizures, severe constipation recently.  His QoL was significantly better prior to 08/2018 when he suffered a severe seizure at home and struck his head. Seen in ED only but has has worsening mental status since. Per chart, this was ascribed to  hypoxic ischemic encephalopathy following prolonged seizure.   Presented via ED for increasing lethargy and vomiting and decreased urine output.   Significant disconnect regarding goals of care as currently receiving hospice care at home but still full code and mother not yet willing to place limits on care.  Unable to place NGTx2. Increasing respiratory distress and worsening lactic acidosis in spite of fluid resuscitation and normal blood pressure.  Past Medical History   Past Medical History:  Diagnosis Date  . Autistic disorder, current or active state   . Fatty liver   . Hypertension   . Obesity   . Pancreatitis    at age 26 years  . Pneumonia   . Pre-diabetes   . Seizures ([place])    as of 11/23/15 - no seizures for 3 month  . Sleep apnea    not on cpap (can't tolerate)  . Tics of organic origin   . Tourette syndrome    Past Surgical History:  Procedure Laterality Date  . BIOPSY  11/08/2018   Procedure: BIOPSY;  Surgeon: Jerene Bears, MD;  Location: [facility];  Service: Gastroenterology;;  . ESOPHAGOGASTRODUODENOSCOPY (EGD) WITH PROPOFOL N/A 11/08/2018   Procedure: ESOPHAGOGASTRODUODENOSCOPY (EGD) WITH PROPOFOL;  Surgeon: Jerene Bears, MD;  Location: [facility];  Service: Gastroenterology;  Laterality: N/A;  . FLEXIBLE  SIGMOIDOSCOPY N/A 11/21/2018   Procedure: FLEXIBLE SIGMOIDOSCOPY;  Surgeon: Irving Copas., MD;  Location: [facility];  Service: Gastroenterology;  Laterality: N/A;  . IMPACTION REMOVAL  11/21/2018   Procedure: IMPACTION REMOVAL;  Surgeon: Irving Copas., MD;  Location: [facility];  Service: Gastroenterology;;  . RADIOLOGY WITH ANESTHESIA N/A 11/24/2015   Procedure: CT SCAN ABDOMEN AND PELVIS WITH CONTRAST;  Surgeon: Medication Radiologist, MD;  Location: [place];  Service: Radiology;  Laterality: N/A;  . TONSILLECTOMY     and adenoidectomy    Significant Hospital Events   11/14 admission to [facility]. 11/15 increasing lactate despite fluids. 12/10/2018 transfer to intensive care unit  Consults:  PCCM 11/16. Urology 12/10/2018  Procedures:  11/09/2018 NG tube placed per interventional radiology  Significant Diagnostic Tests:  11/14 - CT abdomen/pelvis: obese, small LLL infiltrate, dilated gas filled loops of small and large bowel to rectum, no transition. No gastric distension, moderate stool burden in ascending colon. 12/10/2018 MRI brain cervical vertebrae with abnormal T2 signal restricted diffusion both medial thalami most likely diagnosis of Warnicke's encephalopathy Micro Data:  SARS-Cov2 -negative Blood cultures -no growth to date Urine culture - negative  Antimicrobials:  Cefepime 11/14 -  Flagyl 11/14-  Interim history/subjective:  Appears hemodynamically stable.  Mother is at the bedside states he is nonverbal at baseline. Not agitated  Objective   Blood pressure 110/86, pulse (!) 134, temperature (!) 102.5 F (39.2 C), temperature source Axillary, resp. rate (!) 25, height 5\' 11"  (1.803 m), weight 135.2  kg, SpO2 97 %.        Intake/Output Summary (Last 24 hours) at 12/12/2018 0850 Last data filed at 12/12/2018 0600 Gross per 24 hour  Intake 2827.31 ml  Output 2050 ml  Net 777.31 ml   Filed Weights   November 11, 2018 1402 November 11, 2018 1418  Weight: (!)  148.8 kg 135.2 kg    Examination: General: Overweight, comfortable HEENT: Dry oral mucosa Neuro: Nonverbal, resting comfortably CV: S1-S2 appreciated PULM: Rhonchi GI: Soft faint bowel sounds Extremities: warm/dry, 1+ edema  Skin: no rashes or lesions  Chest x-ray reviewed-hypoventilated lung fields  Resolved Hospital Problem list   None  Assessment & Plan:  Acute on chronic respiratory failure Obstructive sleep apnea history-not tolerant of positive pressure ventilation -Minimize sedation -Mobilize as tolerated -Empiric antibiotics for possible aspiration pneumonia  Suspected sepsis Procalcitonin 0.36 lactic acid of 7 T-max 102.5 Cultures negative -Empiric cefepime, metronidazole-day 3 -Fevers concerning  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 VAP protocol (if indicated): N/A Respiratory support goals: Nasal cannula DVT prophylaxis: UFH tid Nutritional status and feeding goals: Trickle feeds  GI prophylaxis: Protonix bid for esophagitis Fluid status goals: continue fluid resuscitation, likely total body water dehydration.  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 Monitor when start feeding. Mobility/therapy needs: bedrest for now. Antibiotic de-escalation: Continue current empiric antibiotics Home medication reconciliation: Restart Omfi once enteral access available Daily labs: BMP daily Code Status: Full code Family Communication: Discussed with mom  at bedside Disposition: ICU  Labs   CBC: Recent Labs  Lab November 11, 2018 1425 12/09/18 0114 12/10/18 0250 12/11/18 0659  WBC 9.1 5.6 4.6 5.0  NEUTROABS 6.4 3.9 2.8 3.1  HGB 14.0 12.4* 10.9* 10.5*  HCT 42.3 37.3* 33.3* 32.0*  MCV 82.1 82.9 84.5 84.4  PLT 603* 358 331 306    Basic Metabolic Panel: Recent Labs  Lab November 11, 2018 1425 12/09/18 0114 12/10/18 0250 12/11/18 0659 12/11/18 1719 12/11/18 1836 12/12/18 0532  NA 132* 135 137 151*  --  151*  --   K 3.1* 3.5 4.0 3.7  --  3.3*  --   CL 88* 98 104 111  --  116*  --   CO2 23 23 16* 26  --  23  --   GLUCOSE 137* 107* 90 82  --  87  --   BUN 19 14 10 17   --  17  --   CREATININE 1.18 0.56* 0.71 0.59*  --  0.60*  --   CALCIUM 9.5 8.5* 8.4* 8.4*  --  8.3*  --   MG  --   --   --   --  1.8  --  1.8  PHOS  --   --   --   --  2.2*  --  2.6   GFR: Estimated Creatinine Clearance: 205.2 mL/min (A) (by C-G formula based on SCr of 0.6 mg/dL (L)). Recent Labs  Lab November 11, 2018 1425  12/09/18 0114  12/10/18 0250 12/10/18 0545 12/10/18 1107 12/10/18 1702 12/11/18 0659 12/11/18 1029  PROCALCITON  --   --  0.36  --   --   --   --   --   --   --  WBC 9.1  --  5.6  --  4.6  --   --   --  5.0  --   LATICACIDVEN  --    < > 4.7*   < >  --  8.1* 7.2* 5.3*  --  1.6   < > = values in this interval not displayed.    Liver Function Tests: Recent Labs  Lab December 14, 2018 1425 12/09/18 0114 12/10/18 0250 12/11/18 0659  AST 93* 71* 69* 84*  ALT 159* 124* 109* 121*  ALKPHOS 49 39 35* 32*  BILITOT 1.1 1.3* 0.8 1.2  PROT 6.5 5.6* 5.4* 5.0*  ALBUMIN 3.5 2.9* 2.5* 2.6*   No results for input(s): LIPASE, AMYLASE in the last 168 hours. No results for input(s): AMMONIA in the last 168 hours.  ABG    Component Value Date/Time   PHART 7.355 12/10/2018 0912   PCO2ART 31.7 (L) 12/10/2018 0912   PO2ART 97.5 12/10/2018 0912   HCO3 16.8 (L) 12/10/2018 0912   ACIDBASEDEF 7.3 (H) 12/10/2018 0912   O2SAT 97.2 12/10/2018 0912    The patient is  critically ill with multiple organ systems failure and requires high complexity decision making for assessment and support, frequent evaluation and titration of therapies, application of advanced monitoring technologies and extensive interpretation of multiple databases. Critical Care Time devoted to patient care services described in this note independent of APP/resident time (if applicable)  is 30 minutes.   Virl Diamond MD [place] [facility] Critical [facility] Personal pager: [phone] If unanswered, please page CCM On-call: #[phone]

## 2018-12-12 NOTE — Progress Notes (Signed)
eLink Physician-Brief Progress Note Patient Name: Aaron Vega DOB: 10-Feb-1995 MRN: [id number]   Date of Service  12/12/2018  HPI/Events of Note  Mg++ 1.5  eICU Interventions  MgSO4 6 gm iv bolus over 6 hours per Elink electrolyte replacement protocol        Kerry Kass Teja Costen 12/12/2018, 8:12 PM

## 2018-12-12 NOTE — Progress Notes (Signed)
NEUROLOGY PROGRESS NOTE  Subjective: Patient continues to breathe on his own.  Nonverbal at baseline  Exam: Vitals:   12/12/18 0600 12/12/18 0800  BP: 110/86   Pulse: (!) 134   Resp: (!) 25   Temp:  (!) 102.5 F (39.2 C)  SpO2: 97%     ROS Unable to obtain as he is nonverbal    Physical Exam  Constitutional: Appears well-developed and well-nourished.  Psych: Nonverbal Eyes: No scleral injection HENT: No OP obstrucion Head: Normocephalic.  Cardiovascular: Tachycardic Respiratory: Effort normal, non-labored breathing GI: Soft.  Distended Skin: WDI   Neuro:  Mental Status: Resting comfortably, snoring Cranial Nerves: II: Blinks to threat bilaterally III,IV, VI: Eyelids remain closed, pupils equal round and reactive.  When eyelids open patient has vertical nystagmus (fast up-beating) V,VII: Face is symmetrical and winces to pain Motor: Flaccid throughout no withdrawal to noxious stimuli Sensory: No wincing to noxious stimuli Deep Tendon Reflexes: 2+ bilateral upper extremities no ankle jerk or knee jerk Plantars: Mute bilaterally   Medications:  Scheduled: . chlorhexidine  15 mL Mouth Rinse BID  . Chlorhexidine Gluconate Cloth  6 each Topical Daily  . cloBAZam  60 mg Per Tube BID  . cyanocobalamin  1,000 mcg Subcutaneous Daily  . feeding supplement (VITAL 1.5 CAL)  1,000 mL Per Tube Q24H  . [START ON 12/13/2018] heparin injection (subcutaneous)  5,000 Units Subcutaneous Q8H  . levothyroxine  25 mcg Per Tube Q0600  . mouth rinse  15 mL Mouth Rinse q12n4p  . nystatin   Topical Daily  . pantoprazole (PROTONIX) IV  40 mg Intravenous Q12H  . phytonadione  2 mg Oral Once  . polyethylene glycol  17 g Per Tube BID  . sodium chloride flush  10-40 mL Intracatheter Q12H  . sodium chloride flush  3 mL Intravenous Q12H  . sodium phosphate  1 enema Rectal Once   Continuous: . sodium chloride    . lactated ringers 75 mL/hr at 12/12/18 0636  . levETIRAcetam Stopped  (12/11/18 2123)  . thiamine injection Stopped (12/11/18 2149)  . [START ON 12/13/2018] thiamine injection      Pertinent Labs/Diagnostics: Thiamine level pending Copper level pending  MRI Brain W Wo Contrast Result Date: 12/10/2018  IMPRESSION: Abnormal T2 signal and restricted diffusion in both medial thalami. 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 Electronically Signed   By: Aaron Vega M.D.   On: 12/10/2018 17:16    Aaron Morn PA-C [facility] [phone]   Assessment: 23 year old autistic male with known epilepsy, nonverbal at baseline who presented with 6-week history of rapid decline in cognition and gait in addition to new onset peripheral neuropathic symptoms since the onset of intractable nausea/vomiting for approximately 6 weeks.  He sustained an approximately 50 pound weight loss over that time period. 1.  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 2.  MRI findings also consistent with Warnicke's encephalopathy 3.  Severe B12 deficiency with level of 159.  This most likely is contributing to or may be the sole etiology of bilateral extremity weakness as well as areflexia seen on exam. 4. AIDP or CIDP would be unlikely given confirmed B12 deficiency and high likelihood of acute thiamine deficiency as the etiologies for the patient's lower extremity areflexia and weakness 5.  MRI brain and cervical spine did not reveal  any lesions to explain his bilateral lower extremity weakness and areflexia 6.  EEG did not show any epileptiform activity thus high likelihood this is not subclinical seizures.   Recommendations: -Patient continues to have fever with no source of infection -B12 injection 1000 mcg daily x7 days followed by weekly for 1  month prior to rechecking levels and assessing supplementation requirements -MRI of thoracic and lumbar spine-called MRI today and they will try to obtain MRI.  Discussed with PCCM who also feels he is stable at this point in time to have MRI -He has been started on high-dose thiamine 500 mg IV 3 times daily for 3 days then decrease to 250 mg IV 3 times daily for 3 days and at that point continue supplemental 100 mg daily thereafter. -May consider fluoroscopy guided LP to assess for possible albuminocytologic dissociation -Continue Onfi via tube and Keppra 1000 mg IV twice daily   Electronically signed: Dr. Kerney Vega 12/12/2018, 8:56 AM

## 2018-12-12 NOTE — Progress Notes (Addendum)
In MRI w/ pt attempting to get MRI of thoracic and lumbar spine.  Pt unable to lay still long enough to obtain images. Gave 1mg  ativan w/ minimal improvement. Pt continues to move head.  MRI tech states will be unable to get any pictures without heavy sedation. Concerns for pt safety if additional sedation given in this setting.   Spoke w/ Dr. Ander Slade, CCM MD, who provided verbal orders to cancel MRI and notify Neuro.  Spoke w/ Dr. Cheral Marker to relay.

## 2018-12-12 NOTE — Progress Notes (Signed)
[place] Progress Note Patient Name: Aaron Vega DOB: [birth year] MRN: [id number]   Date of Service  12/12/2018  HPI/Events of Note  RN requests check of NG tube position on KUB  eICU Interventions  NG tube confirmed in the stomach, okay to use.        [name] 12/12/2018, 9:46 PM

## 2018-12-13 ENCOUNTER — Inpatient Hospital Stay (HOSPITAL_COMMUNITY)

## 2018-12-13 DIAGNOSIS — E512 Wernicke's encephalopathy: Secondary | ICD-10-CM | POA: Diagnosis not present

## 2018-12-13 DIAGNOSIS — R6520 Severe sepsis without septic shock: Secondary | ICD-10-CM | POA: Diagnosis not present

## 2018-12-13 DIAGNOSIS — A419 Sepsis, unspecified organism: Secondary | ICD-10-CM | POA: Diagnosis not present

## 2018-12-13 LAB — BASIC METABOLIC PANEL
Anion gap: 11 (ref 5–15)
BUN: 5 mg/dL — ABNORMAL LOW (ref 6–20)
CO2: 29 mmol/L (ref 22–32)
Calcium: 8.5 mg/dL — ABNORMAL LOW (ref 8.9–10.3)
Chloride: 98 mmol/L (ref 98–111)
Creatinine, Ser: 0.47 mg/dL — ABNORMAL LOW (ref 0.61–1.24)
GFR calc Af Amer: >60 mL/min (ref >60)
GFR calc non Af Amer: >60 mL/min (ref >60)
Glucose, Bld: 112 mg/dL — ABNORMAL HIGH (ref 70–99)
Potassium: 4.4 mmol/L (ref 3.5–5.1)
Sodium: 138 mmol/L (ref 135–145)

## 2018-12-13 LAB — GLUCOSE, CAPILLARY
Glucose-Capillary: 113 mg/dL — ABNORMAL HIGH (ref 70–99)
Glucose-Capillary: 108 mg/dL — ABNORMAL HIGH (ref 70–99)
Glucose-Capillary: 99 mg/dL (ref 70–99)
Glucose-Capillary: 91 mg/dL (ref 70–99)

## 2018-12-13 LAB — TYPE AND SCREEN
ABO/RH(D): A POS
Antibody Screen: NEGATIVE

## 2018-12-13 LAB — CULTURE, BLOOD (ROUTINE X 2)
Culture: NO GROWTH
Culture: NO GROWTH

## 2018-12-13 LAB — PHOSPHORUS: Phosphorus: 3.6 mg/dL (ref 2.5–4.6)

## 2018-12-13 LAB — PROTIME-INR
INR: 1.1 (ref 0.8–1.2)
Prothrombin Time: 13.7 seconds (ref 11.4–15.2)

## 2018-12-13 LAB — ABO/RH: ABO/RH(D): A POS

## 2018-12-13 LAB — MAGNESIUM: Magnesium: 2.2 mg/dL (ref 1.7–2.4)

## 2018-12-13 MED ORDER — LACTATED RINGERS IV SOLN
INTRAVENOUS | Status: DC
  Administered 2018-12-13 – 2018-12-16 (×3): via INTRAVENOUS

## 2018-12-13 MED ORDER — HEPARIN SODIUM (PORCINE) 5000 UNIT/ML IJ SOLN
5000.0000 [IU] | Freq: Three times a day (TID) | INTRAMUSCULAR | Status: DC
  Administered 2018-12-13 – 2018-12-21 (×24): 5000 [IU] via SUBCUTANEOUS
  Filled 2018-12-13 (×23): qty 1

## 2018-12-13 MED ORDER — CHLORHEXIDINE GLUCONATE CLOTH 2 % EX PADS
6.0000 | MEDICATED_PAD | Freq: Every day | CUTANEOUS | Status: DC
  Administered 2018-12-15 – 2018-12-21 (×7): 6 via TOPICAL

## 2018-12-13 MED ORDER — SODIUM CHLORIDE 0.9% IV SOLUTION
Freq: Once | INTRAVENOUS | Status: AC
  Administered 2018-12-13: 16:00:00 via INTRAVENOUS

## 2018-12-13 NOTE — Progress Notes (Signed)
[facility] Liaison note Telephone cal to patient's mother [name], unable to reach or leave a message. Contacted patient's grandmother Randal Buba, emotional support provided. Family remains hopeful for improvement Chart notes reviewed. Noted that was  MRI was unable to be performed yesterday or the LP for today.  NG remains in place for medications and feedings. IV antibiotics, fluids and thiamine also continue as well as supplemental oxygen. No discharge plan a this time. Hospice will continue to follow and anticipate discharge needs.  Flo Shanks BSN, RN, [place] [phone]

## 2018-12-13 NOTE — Progress Notes (Addendum)
Informed about elevated INR   Discussed with mother  FFP order placed  Re-check INR

## 2018-12-13 NOTE — Progress Notes (Addendum)
NEUROLOGY PROGRESS NOTE  Subjective: Patient remains unchanged at this time.  Very lethargic.  Does not follow verbal commands.  Responds to pain  Exam: Vitals:   12/13/18 0600 12/13/18 0747  BP: 129/66   Pulse: (!) 125   Resp: (!) 26   Temp:  (!) 100.4 F (38 C)  SpO2: 98%     ROS Unable to be obtained secondary to nonverbal   Physical Exam  Constitutional: Appears well-developed and well-nourished.  Psych: Nonverbal and lethargic Eyes: No scleral injection HENT: No OP obstrucion Head: Normocephalic.  Cardiovascular: Tachycardic Respiratory: labored breathing GI: Soft.  distension.   Skin: WDI   Neuro:  Mental Status:  Patient is nonverbal. Shakes head and opens eyes to noxious. Not following commands.  Cranial Nerves: II: Blinks to threat III,IV, VI: Eyelids remain closed, pupils equal round reactive.  When eyelids are open patient has vertical nystagmus again fast up beating V,VII: Face equal and winces to pain Motor: Flaccid throughout with no withdrawal to noxious stimuli Sensory: Winces to pain and bilateral upper thighs and bilateral upper extremities Deep Tendon Reflexes: 2+ bilateral upper extremities with no knee jerk or ankle jerk Plantars: Mute bilaterally   Medications:  Scheduled: . chlorhexidine  15 mL Mouth Rinse BID  . Chlorhexidine Gluconate Cloth  6 each Topical Daily  . cloBAZam  60 mg Per Tube BID  . cyanocobalamin  1,000 mcg Subcutaneous Daily  . feeding supplement (VITAL 1.5 CAL)  1,000 mL Per Tube Q24H  . heparin injection (subcutaneous)  5,000 Units Subcutaneous Q8H  . levothyroxine  25 mcg Per Tube Q0600  . mouth rinse  15 mL Mouth Rinse q12n4p  . nystatin   Topical Daily  . pantoprazole sodium  40 mg Per Tube BID  . polyethylene glycol  17 g Per Tube BID  . sennosides  5 mL Per Tube BID  . sodium chloride flush  10-40 mL Intracatheter Q12H  . sodium chloride flush  3 mL Intravenous Q12H  . [START ON 12/17/2018] thiamine  100 mg  Per Tube Daily   Continuous: . sodium chloride Stopped (12/12/18 2140)  . lactated ringers 50 mL/hr at 12/13/18 0853  . levETIRAcetam 1,000 mg (12/13/18 0841)  . [START ON 12/14/2018] thiamine injection    . 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  As noted prior, he sustained an approximate 50 pound weight loss over that time. 1.  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 2.  MRI brain findings also consistent with Wernicke's encephalopathy 3.  Severe B12 deficiency with level of 159.  This most likely is contributing to or may be the sole etiology of bilateral lower extremity weakness and areflexia seen on exam. 4. AIDP or CIDP would be unlikely given confirmed B12 deficiency and high likelihood of acute thiamine deficiency as the etiologies for the patient's lower extremity areflexia and weakness. However, will  need LP under fluoroscopy for cell count, protein, glucose and IgG index to rule out an autoimmune demyelinating polyneuropathy (evaluate CSF for albuminocytologic dissociation). Will also need LP as patient has a FUO in conjunction with encephalopathy (although the latter is most likely secondary to acute thiamine deficiency).  5.  MRI brain and cervical spine did  not reveal any lesions to explain his bilateral lower extremity weakness and areflexia 6.  EEG did not show any epileptiform activity thus high likelihood this is not subclinical seizures. 7. Unable to obtain MRI of thoracic and lumbar spine without anesthesia/intubation as patient unable to tolerate lying flat and could not hold still during yesterday's imaging attempt.    Recommendations: -Patient continues to have fever with no source of infection found -B12 injection 1000 mcg daily x7 days followed by weekly for 1 month prior to rechecking levels and assessing supplementation requirements -He has been started on high-dose thiamine 500 mg IV 3 times daily for 3 days then decrease to 250 mg IV 3 times daily for 3 days and at that point continue supplemental 100 mg daily thereafter. -Will obtain fluoroscopy guided LP to assess for possible albuminocytologic dissociation as well as for gram stain, cultures and white count-this was explained to mother who is in the room -Continue Onfi via tube and Keppra 1000 mg IV twice daily  Electronically signed: Dr. Caryl Pina 12/13/2018, 10:17 AM

## 2018-12-13 NOTE — Progress Notes (Signed)
Type and Screen and PT/INR ordered stat, still not collected.  Spoke w/ phlebotomy to relay waiting on this in order to hang FFP. Phlebotomy states understanding.

## 2018-12-13 NOTE — Progress Notes (Signed)
NAME:  Aaron Vega, MRN:  [id number], DOB:  04/21/1995, LOS: 5 ADMISSION DATE:  12/03/2018, CONSULTATION DATE:  12/10/2018 REFERRING MD:  Lajuana RippleKamineni - [facility], CHIEF COMPLAINT:  Rising lactate level.  Brief History   23 year old autistic man with abdominal distension and vomiting.  History of present illness   Known for poor and declining health status from severe autism with seizures, severe constipation recently.  His QoL was significantly better prior to 08/2018 when he suffered a severe seizure at home and struck his head. Seen in ED only but has has worsening mental status since. Per chart, this was ascribed to  hypoxic ischemic encephalopathy following prolonged seizure.   Presented via ED for increasing lethargy and vomiting and decreased urine output.   Significant disconnect regarding goals of care as currently receiving hospice care at home but still full code and mother not yet willing to place limits on care.  Was unable to have MRI performed 12/12/2018 secondary to agitation and risk of respiratory depression with sedation  Past Medical History   Past Medical History:  Diagnosis Date  . Autistic disorder, current or active state   . Fatty liver   . Hypertension   . Obesity   . Pancreatitis    at age 18 years  . Pneumonia   . Pre-diabetes   . Seizures (HCC)    as of 11/23/15 - no seizures for 3 month  . Sleep apnea    not on cpap (can't tolerate)  . Tics of organic origin   . Tourette syndrome    Past Surgical History:  Procedure Laterality Date  . BIOPSY  11/08/2018   Procedure: BIOPSY;  Surgeon: Beverley FiedlerPyrtle, Jay M, MD;  Location: [facility];  Service: Gastroenterology;;  . ESOPHAGOGASTRODUODENOSCOPY (EGD) WITH PROPOFOL N/A 11/08/2018   Procedure: ESOPHAGOGASTRODUODENOSCOPY (EGD) WITH PROPOFOL;  Surgeon: Beverley FiedlerPyrtle, Jay M, MD;  Location: [facility];  Service: Gastroenterology;  Laterality: N/A;  . FLEXIBLE SIGMOIDOSCOPY N/A 11/21/2018   Procedure: FLEXIBLE  SIGMOIDOSCOPY;  Surgeon: Lemar LoftyMansouraty, Gabriel Jr., MD;  [facility];  Service: Gastroenterology;  Laterality: N/A;  . IMPACTION REMOVAL  11/21/2018   Procedure: IMPACTION REMOVAL;  Surgeon: Lemar LoftyMansouraty, Gabriel Jr., MD;  [facility];  Service: Gastroenterology;;  . RADIOLOGY WITH ANESTHESIA N/A 11/24/2015   Procedure: CT SCAN ABDOMEN AND PELVIS WITH CONTRAST;  Surgeon: Medication Radiologist, MD;  Location: [facility];  Service: Radiology;  Laterality: N/A;  . TONSILLECTOMY     and adenoidectomy    Significant Hospital Events   [facility] 11/15 increasing lactate despite fluids. 12/10/2018 transfer to intensive care unit  Consults:  PCCM 11/16. Urology 12/10/2018 Neurology  Procedures:  11/09/2018 NG tube placed per interventional radiology  Significant Diagnostic Tests:  11/14 - CT abdomen/pelvis: obese, small LLL infiltrate, dilated gas filled loops of small and large bowel to rectum, no transition. No gastric distension, moderate stool burden in ascending colon. 12/10/2018 MRI brain cervical vertebrae with abnormal T2 signal restricted diffusion both medial thalami most likely diagnosis of Warnicke's encephalopathy  Micro Data:  SARS-Cov2 -negative Blood cultures -no growth to date Urine culture - negative  Antimicrobials:  Cefepime 11/14 - 11/18 Flagyl 11/14-11/18  Interim history/subjective:  Appears hemodynamically stable.  Mother is at the bedside states he is nonverbal at baseline. Not agitated   Objective   Blood pressure 129/66, pulse (!) 125, temperature (!) 100.4 F (38 C), temperature source Axillary, resp. rate (!) 26, height 5\' 11"  (1.803 m), weight 134.7 kg, SpO2 98 %.  Intake/Output Summary (Last 24 hours) at 12/13/2018 5409 Last data filed at 12/13/2018 0800 Gross per 24 hour  Intake 4010.27 ml  Output 4050 ml  Net -39.73 ml   Filed Weights   Dec 29, 2018 1402 12/29/2018 1418 12/13/18 0500  Weight: (!) 148.8 kg 135.2  kg 134.7 kg    Examination: General: Overweight HEENT: Dry oral mucosa Neuro: Nonverbal, writhing around but does not appear generally uncomfortable CV: S1-S2 appreciated PULM: Rhonchi GI: Very faint bowel sounds Extremities: Warm and dry, 1+ edema Skin: No rash or lesions  Chest x-ray reviewed-hypoventilated lung fields  Resolved Hospital Problem list   Hypernatremia  Assessment & Plan:  Acute on chronic respiratory failure Obstructive sleep apnea history, not tolerant of positive pressure ventilation -Minimize sedation  Possible aspiration pneumonia -Antibiotics were deescalated -Discontinued antibiotic 12/12/2018  Suspected sepsis Procalcitonin 0.36 Lactic acid of 7 T-max of 100.3 -Fever pattern better -Monitor off antibiotics at present  Encephalopathy Wernicke's encephalopathy secondary thiamine deficiency -High-dose thiamine -Antiseizure medications -Neurology continues to follow  50 pound weight loss, small bowel ileus -Continue trickle feeds -IV fluids -Switch to lactated Ringer's -MiraLAX, senna, docusate  Patient was scheduled for MRI yesterday 11/18 -He was not cooperative for the test -Significant risk of respiratory compromise with sedation, he does have an underlying obstructive sleep apnea that is untreated secondary to inability to cooperate with use of positive pressure ventilation   Daily Goals Checklist  Pain/Anxiety/Delirium protocol (if indicated): As needed lorazepam for seizures Respiratory support goals: Nasal cannula DVT prophylaxis: UFH tid Nutritional status and feeding goals: Continue trickle feeds, once able to move bowels may increase GI prophylaxis: Protonix twice daily Fluid status goals: Lactated Ringer Central lines: LUE midline Glucose control: 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 Monitor when start feeding. Mobility/therapy needs: Bedrest Antibiotic de-escalation: Antibiotics discontinued 12/12/2018 Home medication reconciliation:  Restart Omfi once enteral access available Daily labs: BMP daily Code Status: Full code Family Communication: Discussed with mom at bedside Disposition: ICU  Labs   CBC: Recent Labs  Lab 2018/12/29 1425 12/09/18 0114 12/10/18 0250 12/11/18 0659  WBC 9.1 5.6 4.6 5.0  NEUTROABS 6.4 3.9 2.8 3.1  HGB 14.0 12.4* 10.9* 10.5*  HCT 42.3 37.3* 33.3* 32.0*  MCV 82.1 82.9 84.5 84.4  PLT 603* 358 331 811    Basic Metabolic Panel: Recent Labs  Lab 12/09/18 0114 12/10/18 0250 12/11/18 0659 12/11/18 1719 12/11/18 1836 12/12/18 0532 12/12/18 1648 12/13/18 0633  NA 135 137 151*  --  151*  --   --  138  K 3.5 4.0 3.7  --  3.3*  --   --  4.4  CL 98 104 111  --  116*  --   --  98  CO2 23 16* 26  --  23  --   --  29  GLUCOSE 107* 90 82  --  87  --   --  112*  BUN 14 10 17   --  17  --   --  5*  CREATININE 0.56* 0.71 0.59*  --  0.60*  --   --  0.47*  CALCIUM 8.5* 8.4* 8.4*  --  8.3*  --   --  8.5*  MG  --   --   --  1.8  --  1.8 1.5* 2.2  PHOS  --   --   --  2.2*  --  2.6 3.1 3.6   GFR: Estimated Creatinine Clearance: 204.7 mL/min (A) (by C-G formula based on SCr of 0.47 mg/dL (L)). Recent Labs  Lab 01-02-2019 1425  12/09/18 0114  12/10/18 0250 12/10/18 0545 12/10/18 1107 12/10/18 1702 12/11/18 0659 12/11/18 1029  PROCALCITON  --   --  0.36  --   --   --   --   --   --   --   WBC 9.1  --  5.6  --  4.6  --   --   --  5.0  --   LATICACIDVEN  --    < > 4.7*   < >  --  8.1* 7.2* 5.3*  --  1.6   < > = values in this interval not displayed.    Liver Function Tests: Recent Labs  Lab Jan 02, 2019 1425 12/09/18 0114 12/10/18 0250 12/11/18 0659  AST 93* 71* 69* 84*  ALT 159* 124* 109* 121*  ALKPHOS 49 39 35* 32*  BILITOT 1.1 1.3* 0.8 1.2  PROT 6.5 5.6* 5.4* 5.0*  ALBUMIN 3.5 2.9* 2.5* 2.6*   No results for input(s): LIPASE, AMYLASE in the last 168 hours. No results for input(s): AMMONIA in the last 168 hours.  ABG    Component Value Date/Time   PHART 7.355 12/10/2018 0912    PCO2ART 31.7 (L) 12/10/2018 0912   PO2ART 97.5 12/10/2018 0912   HCO3 16.8 (L) 12/10/2018 0912   ACIDBASEDEF 7.3 (H) 12/10/2018 0912   O2SAT 97.2 12/10/2018 0912    The patient is critically ill with multiple organ systems failure and requires high complexity decision making for assessment and support, frequent evaluation and titration of therapies, application of advanced monitoring technologies and extensive interpretation of multiple databases. Critical Care Time devoted to patient care services described in this note independent of APP/resident time (if applicable)  is 32 minutes.   Virl Diamond MD [facility] Personal pager: [phone] If unanswered, please page CCM On-call: [phone]

## 2018-12-13 NOTE — Progress Notes (Signed)
Received call from Radiology. Per radiology, pts INR too high for LP today.  Radiology states this has been relayed to Neuro PA who will speak w/ CCM MD. For now, LP moved to tomorrow.

## 2018-12-13 NOTE — Progress Notes (Addendum)
Pts PT/INR came back wnr; 13.7/1.1.  Drawn before first unit of FFP hung.  Relayed to Dr. Ander Slade. Per MD, hold second unit of FFP.  Pt had small episode of emesis, appears to be tube feeding.  Tube feeding stopped. Pt still has not had BM.  Relayed to Dr. Ander Slade. Received verbal orders for soap suds enema, keep tube feeds off.

## 2018-12-14 ENCOUNTER — Inpatient Hospital Stay (HOSPITAL_COMMUNITY)

## 2018-12-14 DIAGNOSIS — Z7189 Other specified counseling: Secondary | ICD-10-CM | POA: Diagnosis not present

## 2018-12-14 DIAGNOSIS — K5900 Constipation, unspecified: Secondary | ICD-10-CM | POA: Diagnosis not present

## 2018-12-14 DIAGNOSIS — Z515 Encounter for palliative care: Secondary | ICD-10-CM | POA: Diagnosis not present

## 2018-12-14 DIAGNOSIS — E512 Wernicke's encephalopathy: Secondary | ICD-10-CM | POA: Diagnosis not present

## 2018-12-14 DIAGNOSIS — F840 Autistic disorder: Secondary | ICD-10-CM | POA: Diagnosis not present

## 2018-12-14 DIAGNOSIS — J690 Pneumonitis due to inhalation of food and vomit: Secondary | ICD-10-CM

## 2018-12-14 DIAGNOSIS — R112 Nausea with vomiting, unspecified: Secondary | ICD-10-CM | POA: Diagnosis not present

## 2018-12-14 DIAGNOSIS — K567 Ileus, unspecified: Secondary | ICD-10-CM | POA: Diagnosis not present

## 2018-12-14 DIAGNOSIS — A419 Sepsis, unspecified organism: Secondary | ICD-10-CM | POA: Diagnosis not present

## 2018-12-14 LAB — PROTIME-INR
INR: 1 (ref 0.8–1.2)
Prothrombin Time: 13.2 seconds (ref 11.4–15.2)

## 2018-12-14 LAB — PREPARE FRESH FROZEN PLASMA: Unit division: 0

## 2018-12-14 LAB — GLUCOSE, CAPILLARY
Glucose-Capillary: 73 mg/dL (ref 70–99)
Glucose-Capillary: 80 mg/dL (ref 70–99)
Glucose-Capillary: 85 mg/dL (ref 70–99)
Glucose-Capillary: 87 mg/dL (ref 70–99)
Glucose-Capillary: 99 mg/dL (ref 70–99)

## 2018-12-14 LAB — CSF CELL COUNT WITH DIFFERENTIAL
RBC Count, CSF: 550 /mm3 — ABNORMAL HIGH
WBC, CSF: 0 /mm3 (ref 0–5)

## 2018-12-14 LAB — BPAM FFP
Blood Product Expiration Date: 202011222359
Blood Product Expiration Date: 202011242359
ISSUE DATE / TIME: 202011191516
Unit Type and Rh: 6200
Unit Type and Rh: 6200

## 2018-12-14 LAB — GLUCOSE, CSF: Glucose, CSF: 60 mg/dL (ref 40–70)

## 2018-12-14 LAB — PROTEIN, CSF: Total  Protein, CSF: 172 mg/dL — ABNORMAL HIGH (ref 15–45)

## 2018-12-14 LAB — COPPER, SERUM: Copper: 122 ug/dL (ref 72–166)

## 2018-12-14 IMAGING — CT CT L SPINE W/O CM
3 of 4 series · 13 of 33 positions shown, 16 images · non-contrast
Comparison: None.

CLINICAL DATA: Myelopathy, acute or progressive.

EXAM:
CT LUMBAR SPINE WITHOUT CONTRAST
TECHNIQUE: Multidetector CT imaging of the lumbar spine was performed without
intravenous contrast administration. Multiplanar CT image
reconstructions were also generated.

[Series 5: l spine 2.0 st · axial · 0.46mm/px · z∈[-517,-327]mm · 5 of 143 slices shown, 7 images]
[im 24/143  soft-tissue]
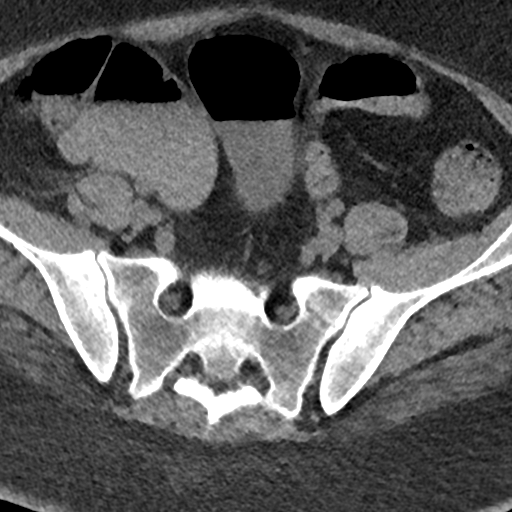
[im 24/143  bone]
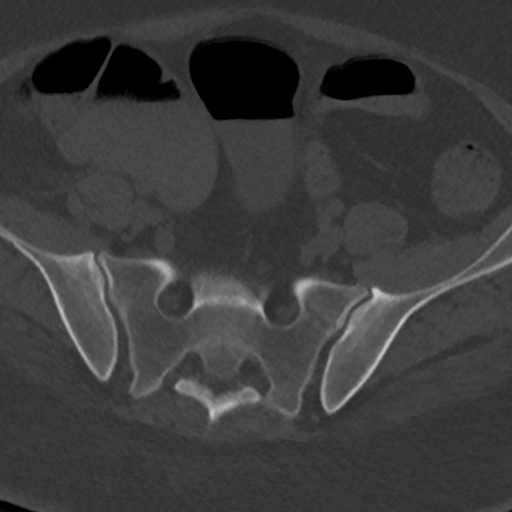
[im 48/143  bone]
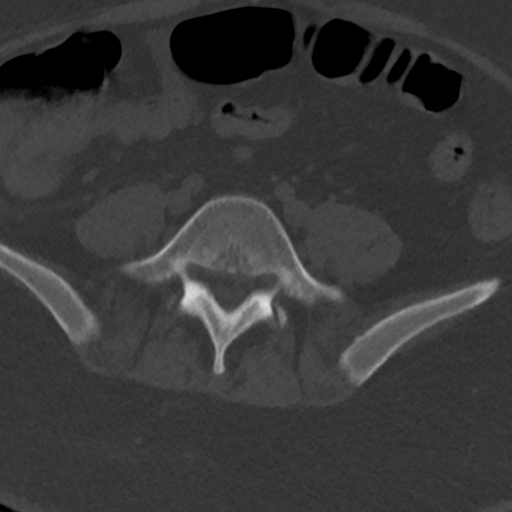
[im 72/143  bone]
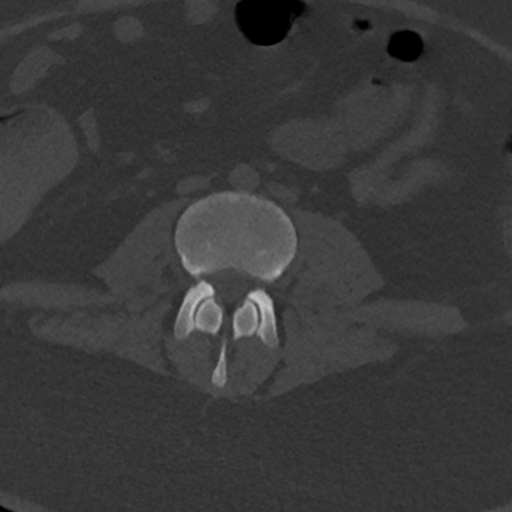
[im 95/143  bone]
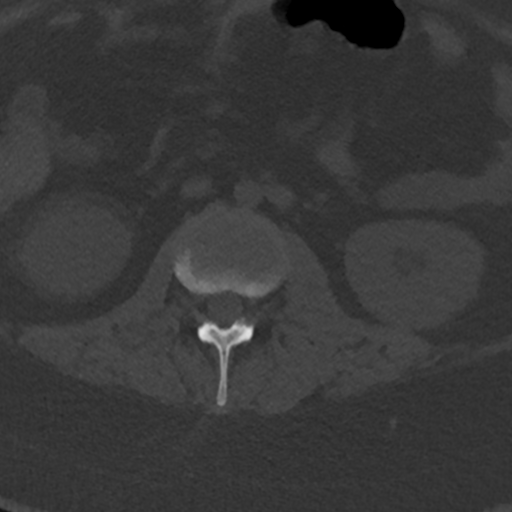
[im 119/143  soft-tissue]
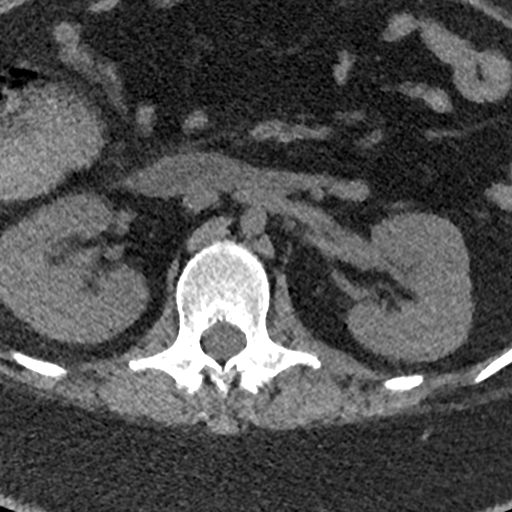
[im 119/143  bone]
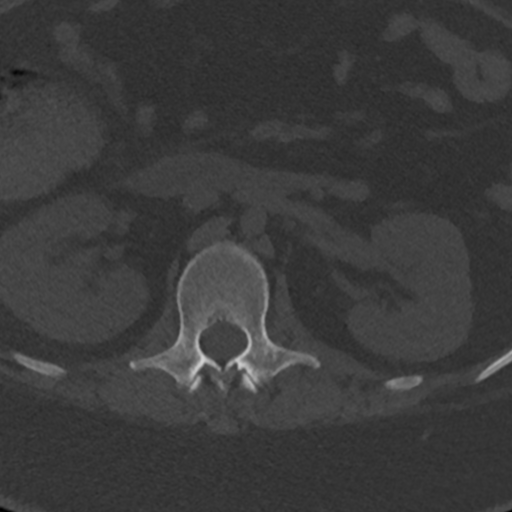

[Series 8: coronal st · coronal · 0.42mm/px · 3 of 86 slices shown]
[im 18/86  bone]
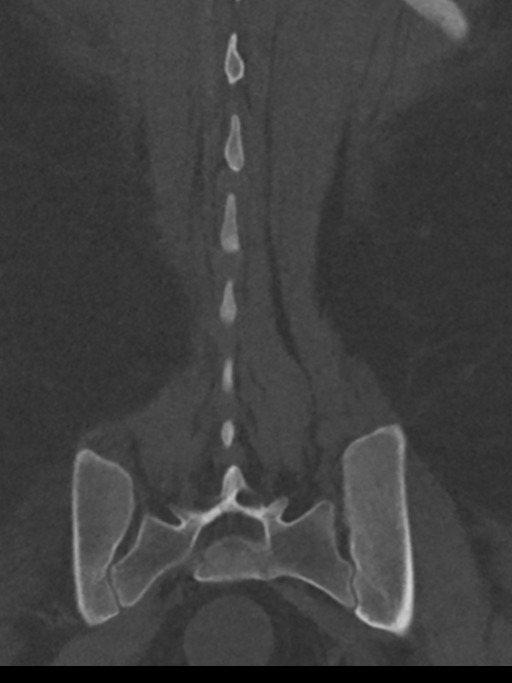
[im 35/86  bone]
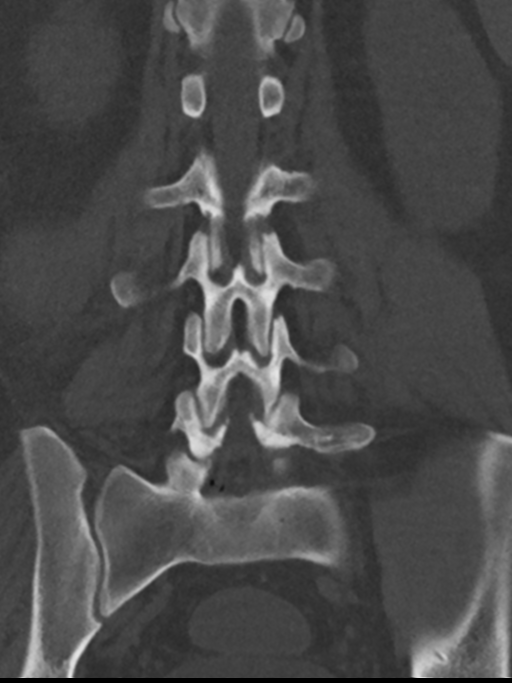
[im 52/86  bone]
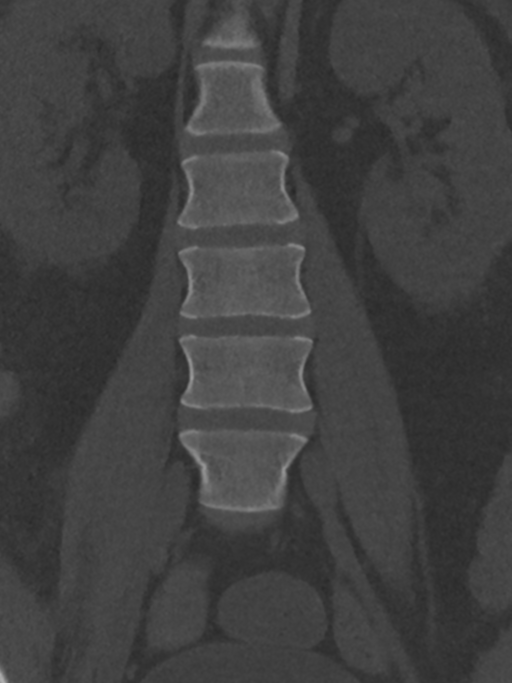

[Series 9: sagittal st · sagittal · 0.37mm/px · 5 of 70 slices shown, 6 images]
[im 24/70  bone]
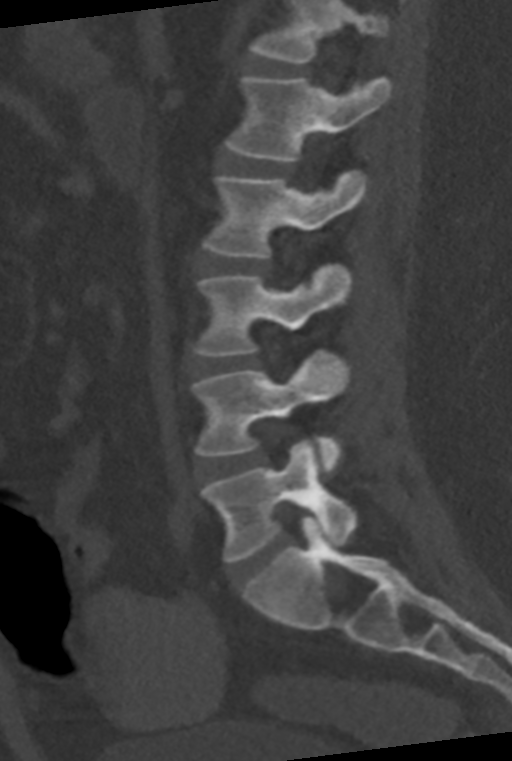
[im 29/70  bone]
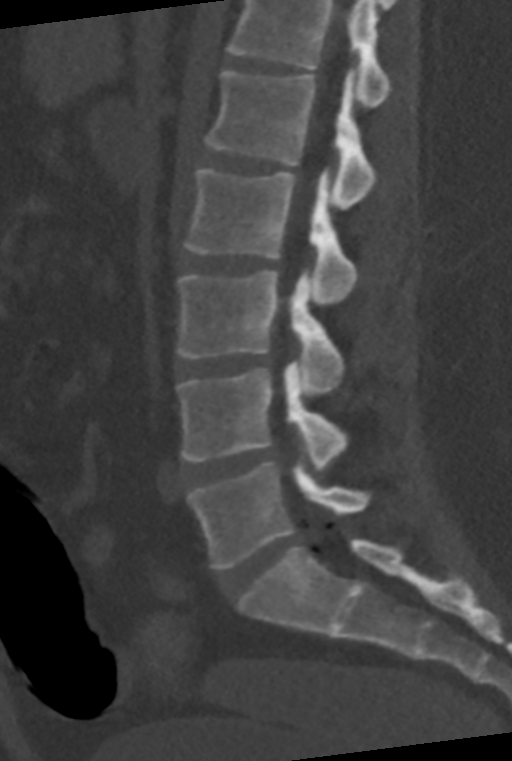
[im 35/70  soft-tissue]
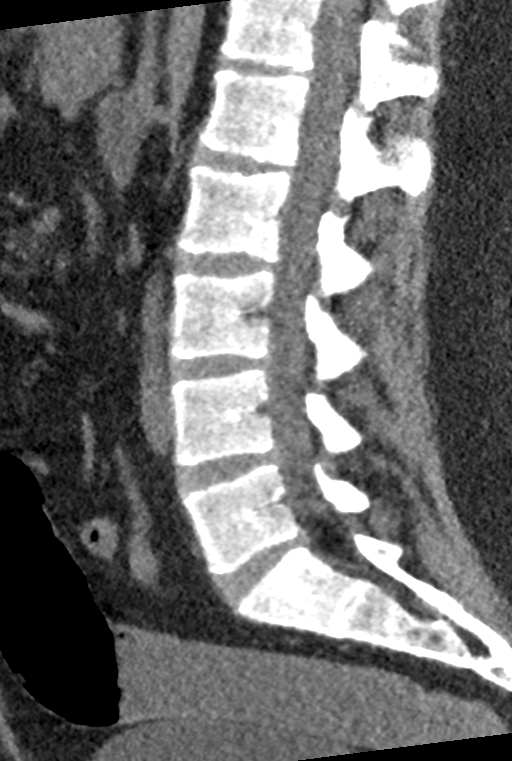
[im 35/70  bone]
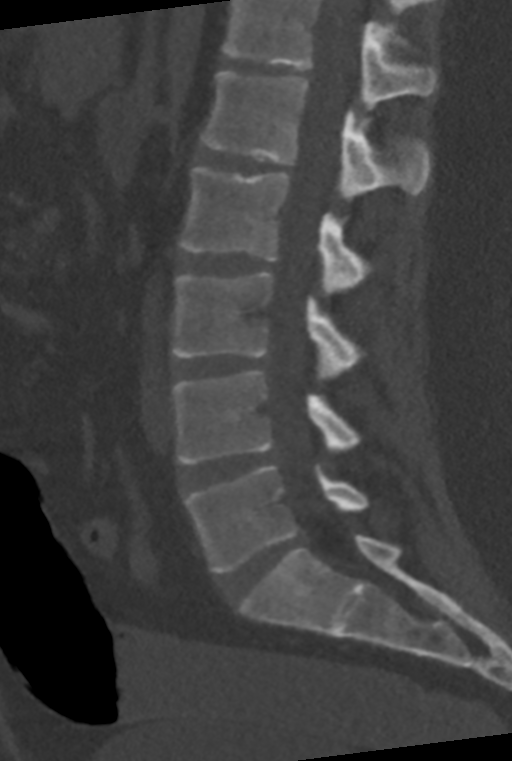
[im 41/70  bone]
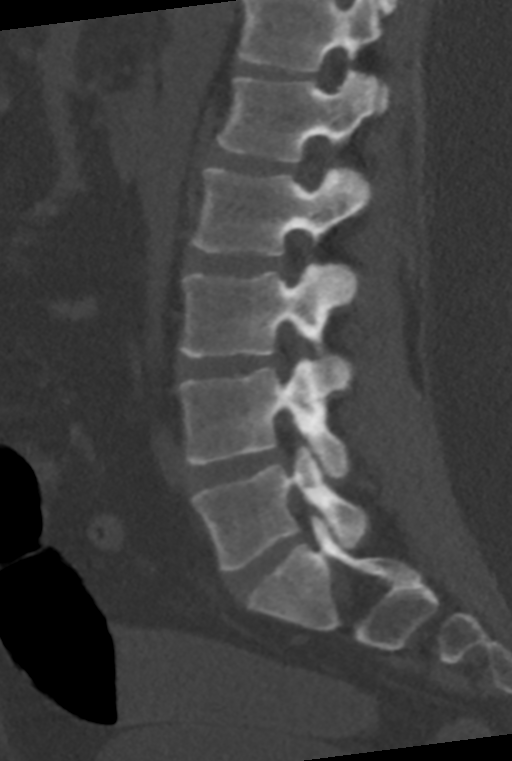
[im 47/70  bone]
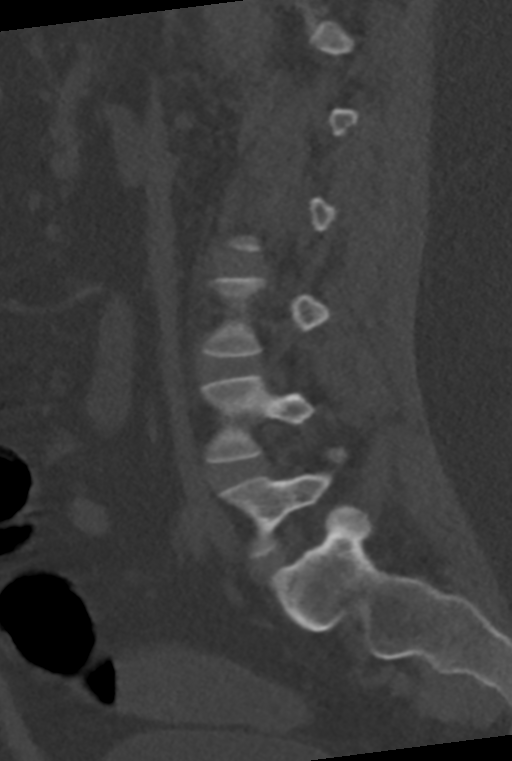

[13 of 33 positions shown; findings below may reference images not displayed]

FINDINGS: Segmentation: Normal

Alignment: Normal

Vertebrae: Negative for fracture or mass.  Normal vertebra.

Paraspinal and other soft tissues: Negative for paraspinous mass or
adenopathy. No soft tissue edema.

Disc levels: Disc spaces maintained. No significant disc
degeneration or spinal stenosis.
IMPRESSION: Negative CT lumbar spine.

## 2018-12-14 IMAGING — RF DG SPINAL PUNCT LUMBAR DIAG WITH FL CT GUIDANCE
8 series · 8 of 8 positions shown · non-contrast
Comparison: none

CLINICAL DATA: Lower extremity weakness, decreased PO intake,
dehydration, concern for Guillain-Barre syndrome

[Series 1: cp_standard · 0.25mm/px · 1 of 1 slices shown (1 of 3)]
[im 1/1]
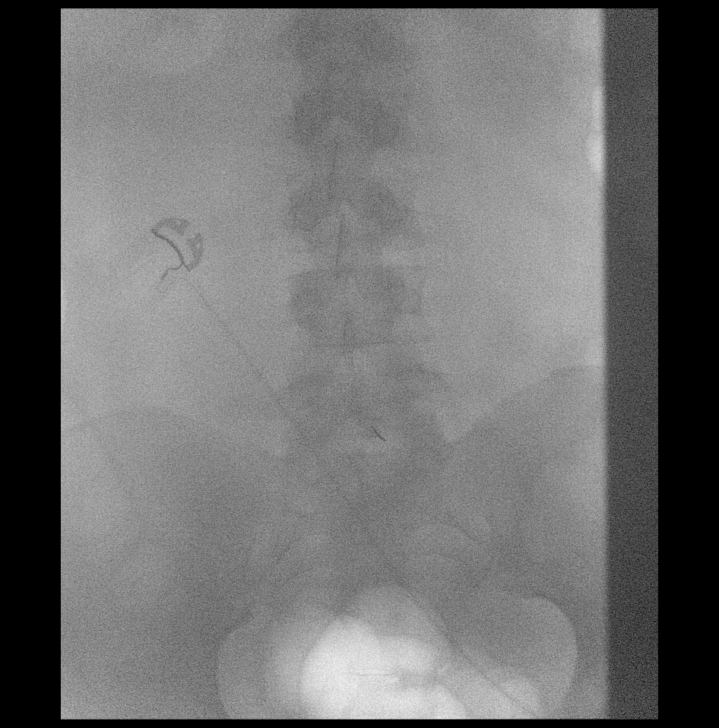

[Series 2: cp_standard · 0.25mm/px · 1 of 1 slices shown (2 of 3)]
[im 1/1]
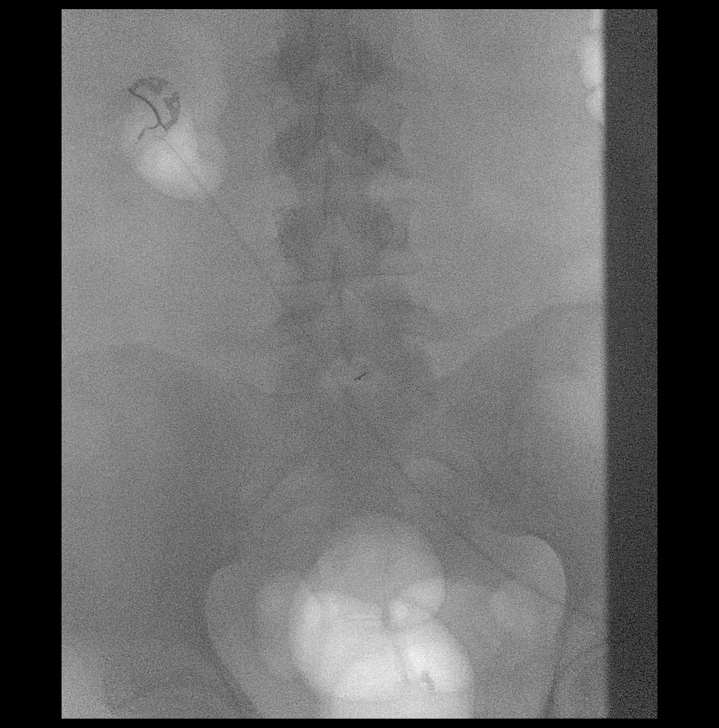

[Series 4: x thoracic spine lat · 0.15mm/px · 1 of 1 slices shown (1 of 4)]
[im 1/1]
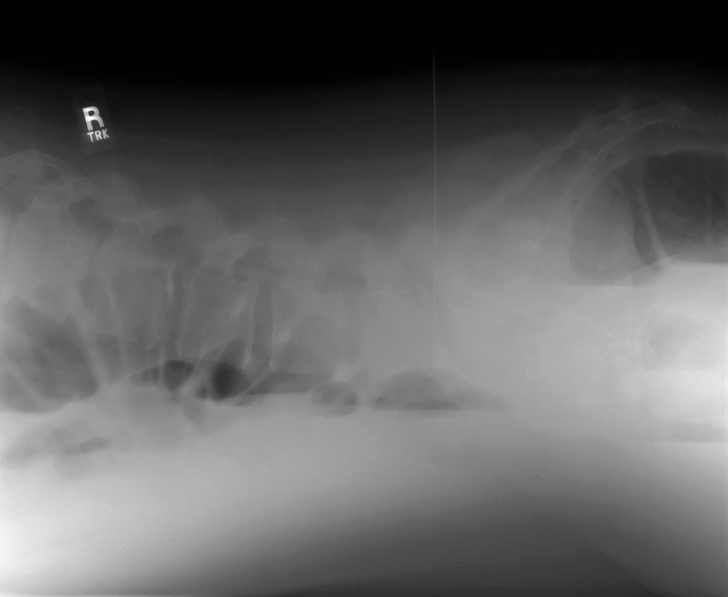

[Series 5: x thoracic spine lat · 0.15mm/px · 1 of 1 slices shown (2 of 4)]
[im 1/1]
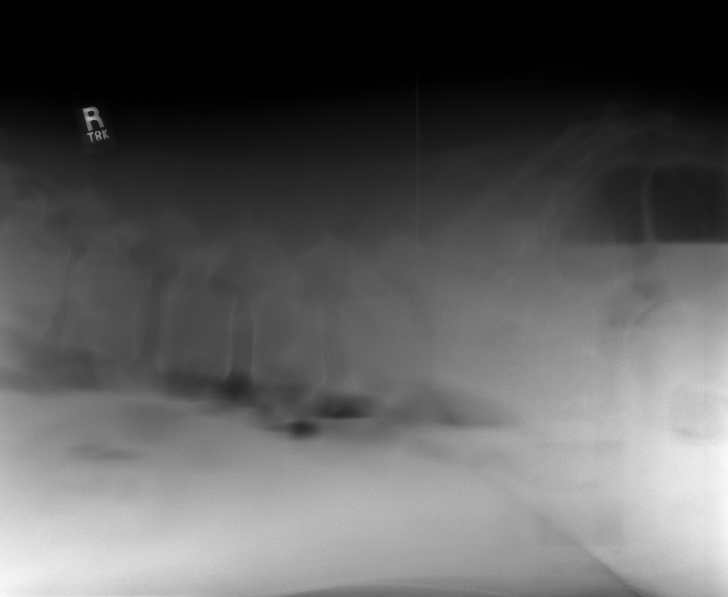

[Series 6: x thoracic spine lat · 0.15mm/px · 1 of 1 slices shown (3 of 4)]
[im 1/1]
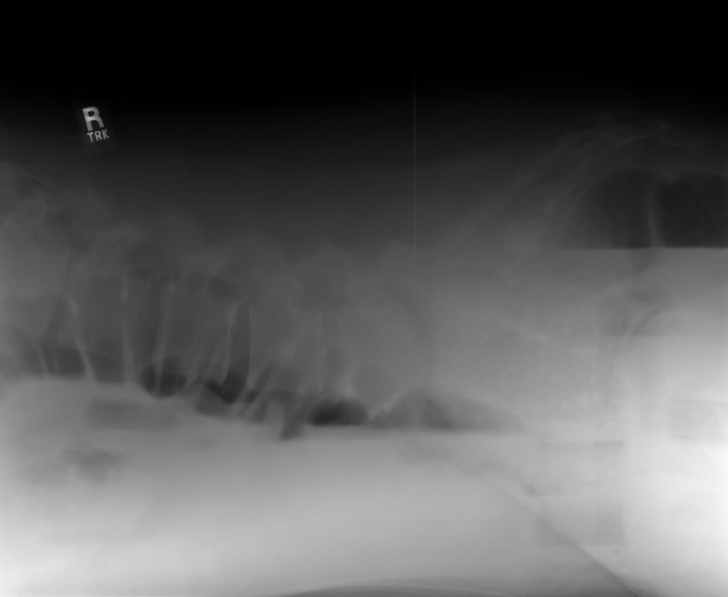

[Series 7: x thoracic spine lat · 0.15mm/px · 1 of 1 slices shown (4 of 4)]
[im 1/1]
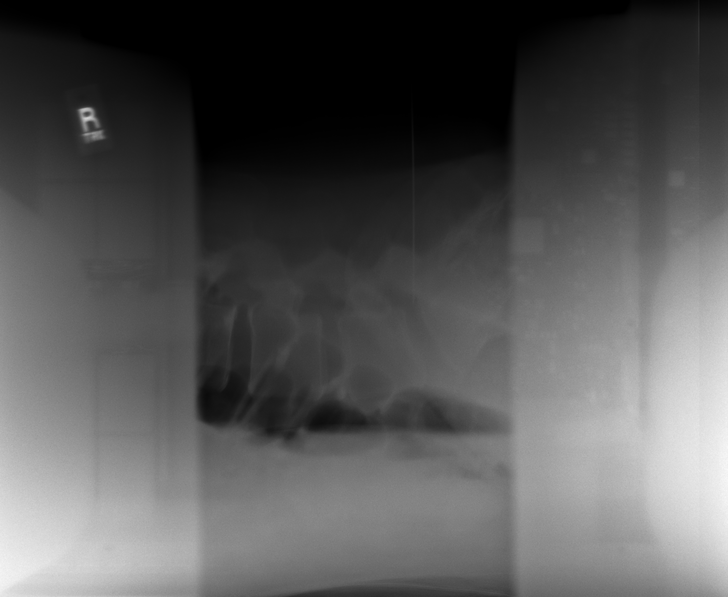

[Series 8: cp_standard · 0.27mm/px · 1 of 1 slices shown (3 of 3)]
[im 1/1]
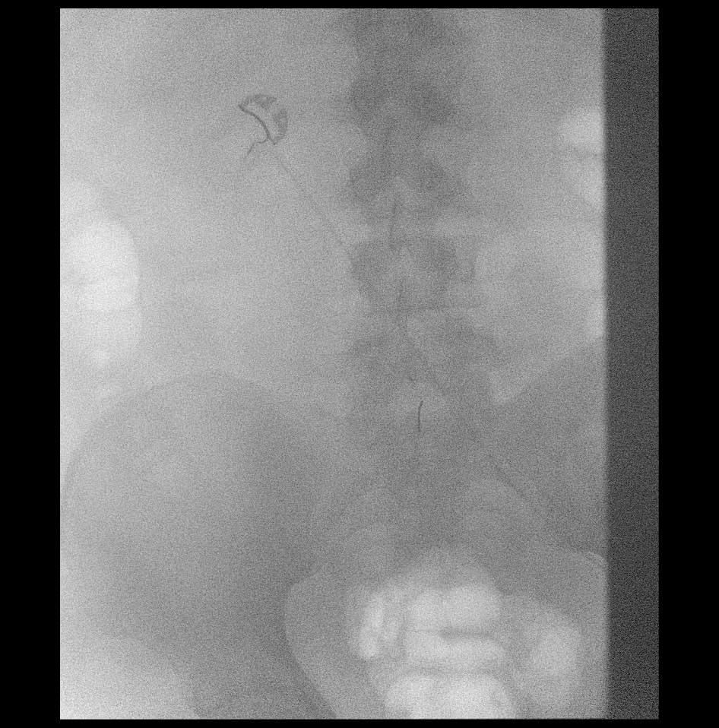

[Series 9: fluoro_iodine 2fps_bw · 0.19mm/px · 1 of 1 slices shown]
[im 1/1]
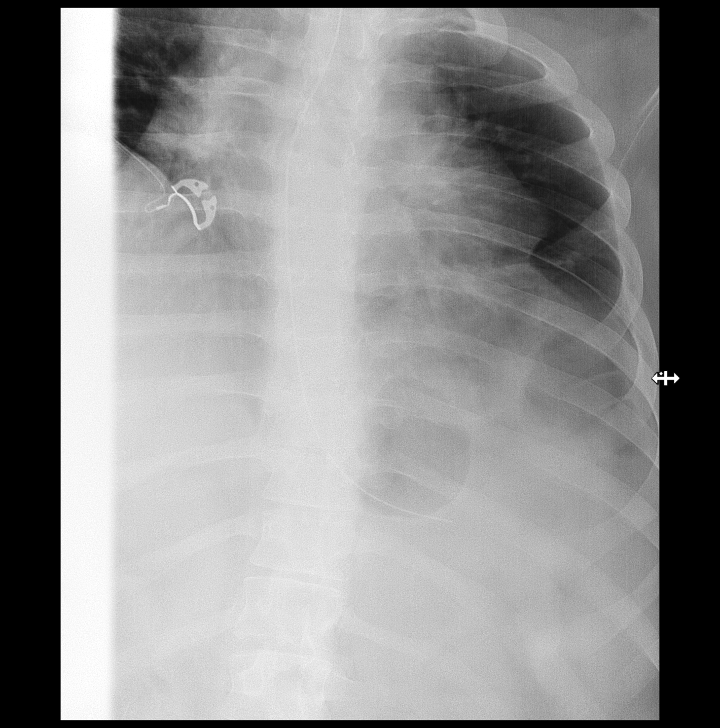

[8 of 8 positions shown; findings below may reference images not displayed]

EXAM:
DIAGNOSTIC LUMBAR PUNCTURE UNDER FLUOROSCOPIC GUIDANCE

FLUOROSCOPY TIME:  Fluoroscopy Time: 72 seconds

Radiation Exposure Index (if provided by the fluoroscopic device):
395.4 mGy

Number of Acquired Spot Images: 3

PROCEDURE:
Informed consent was obtained from the patient prior to the
procedure, including potential complications of headache, allergy,
and pain. With the patient prone, the lower back was prepped with
Betadine. 1% Lidocaine was used for local anesthesia. Lumbar
puncture was performed at the L5-S1 level using a 20 gauge needle
with return of initially blood tinged, subsequently clear CSF.
Extremely low opening pressure, which was not measured. 10.5 ml of
CSF were obtained for laboratory studies. The patient tolerated the
procedure well and there were no apparent complications.

During the procedure, the patient's NG tube started to withdraw.
This was returned to the original position in the gastric cardia and
advanced slightly into the mid body of the stomach.
IMPRESSION: Successful fluoroscopic guided lumbar puncture at L5-S1.

Patient's NG tube readjusted, as described above.

## 2018-12-14 IMAGING — DX DG CHEST 1V PORT
1 series · 1 of 1 positions shown · non-contrast
Comparison: Chest radiograph [DATE], abdominal radiograph
[DATE]

CLINICAL DATA: Shortness of breath.

EXAM:
PORTABLE CHEST 1 VIEW

[chest ap]
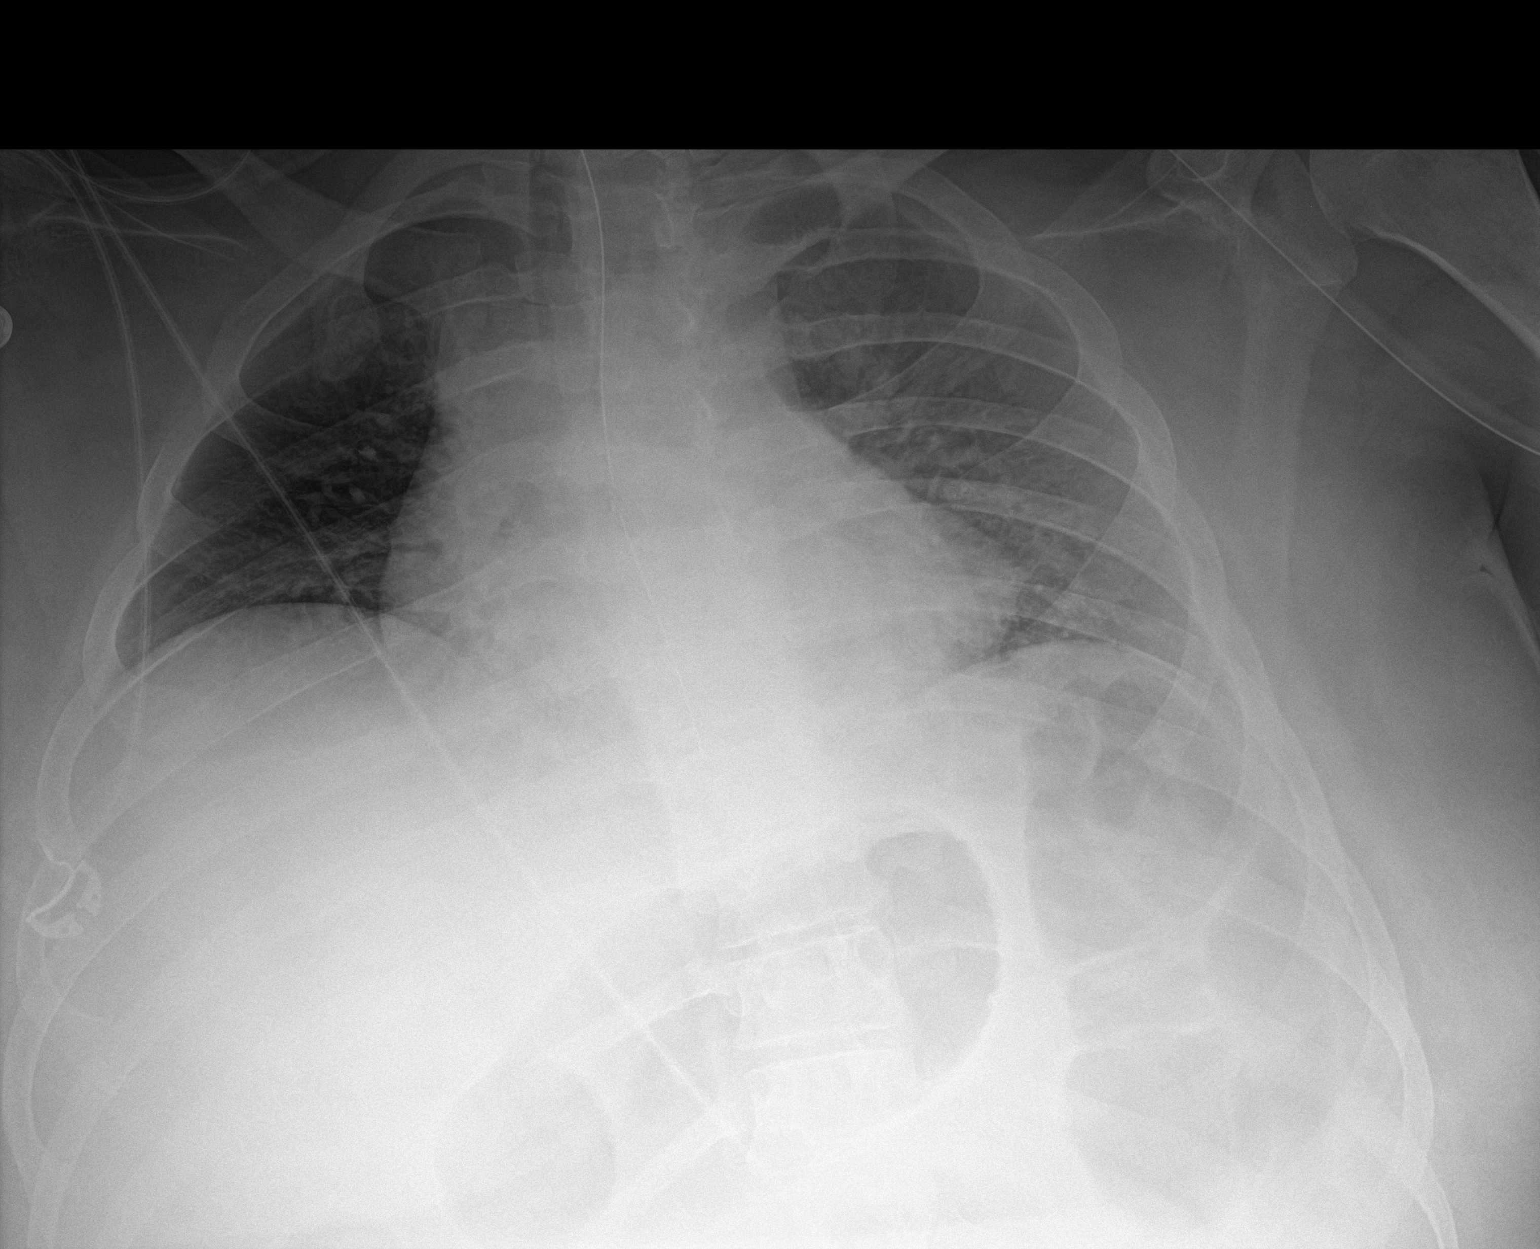

[1 of 1 positions shown; findings below may reference images not displayed]

FINDINGS: Enteric tube is been retracted, tip now in the distal esophagus,
side-port in the mid esophagus. Low lung volumes similar to prior
exam. Slight increasing patchy opacities at the left lung base.
Unchanged heart size and mediastinal contours. No pleural fluid or
pneumothorax.
IMPRESSION: 1. Enteric tube has been retracted, tip now in the distal esophagus,
side-port in the mid esophagus. Recommend advancement of least 10 cm
place the side-port below the diaphragm.
2. Persistent low lung volumes. Increasing patchy opacities at the
left lung base, may be atelectasis or pneumonia.

## 2018-12-14 IMAGING — DX DG ABDOMEN 1V
2 series · 2 of 2 positions shown · non-contrast
Comparison: One view abdomen [DATE] and [DATE]. CT
[DATE].

CLINICAL DATA: Nasogastric tube advancement.

EXAM:
ABDOMEN - 1 VIEW

[abdomen kub (1 of 2)]
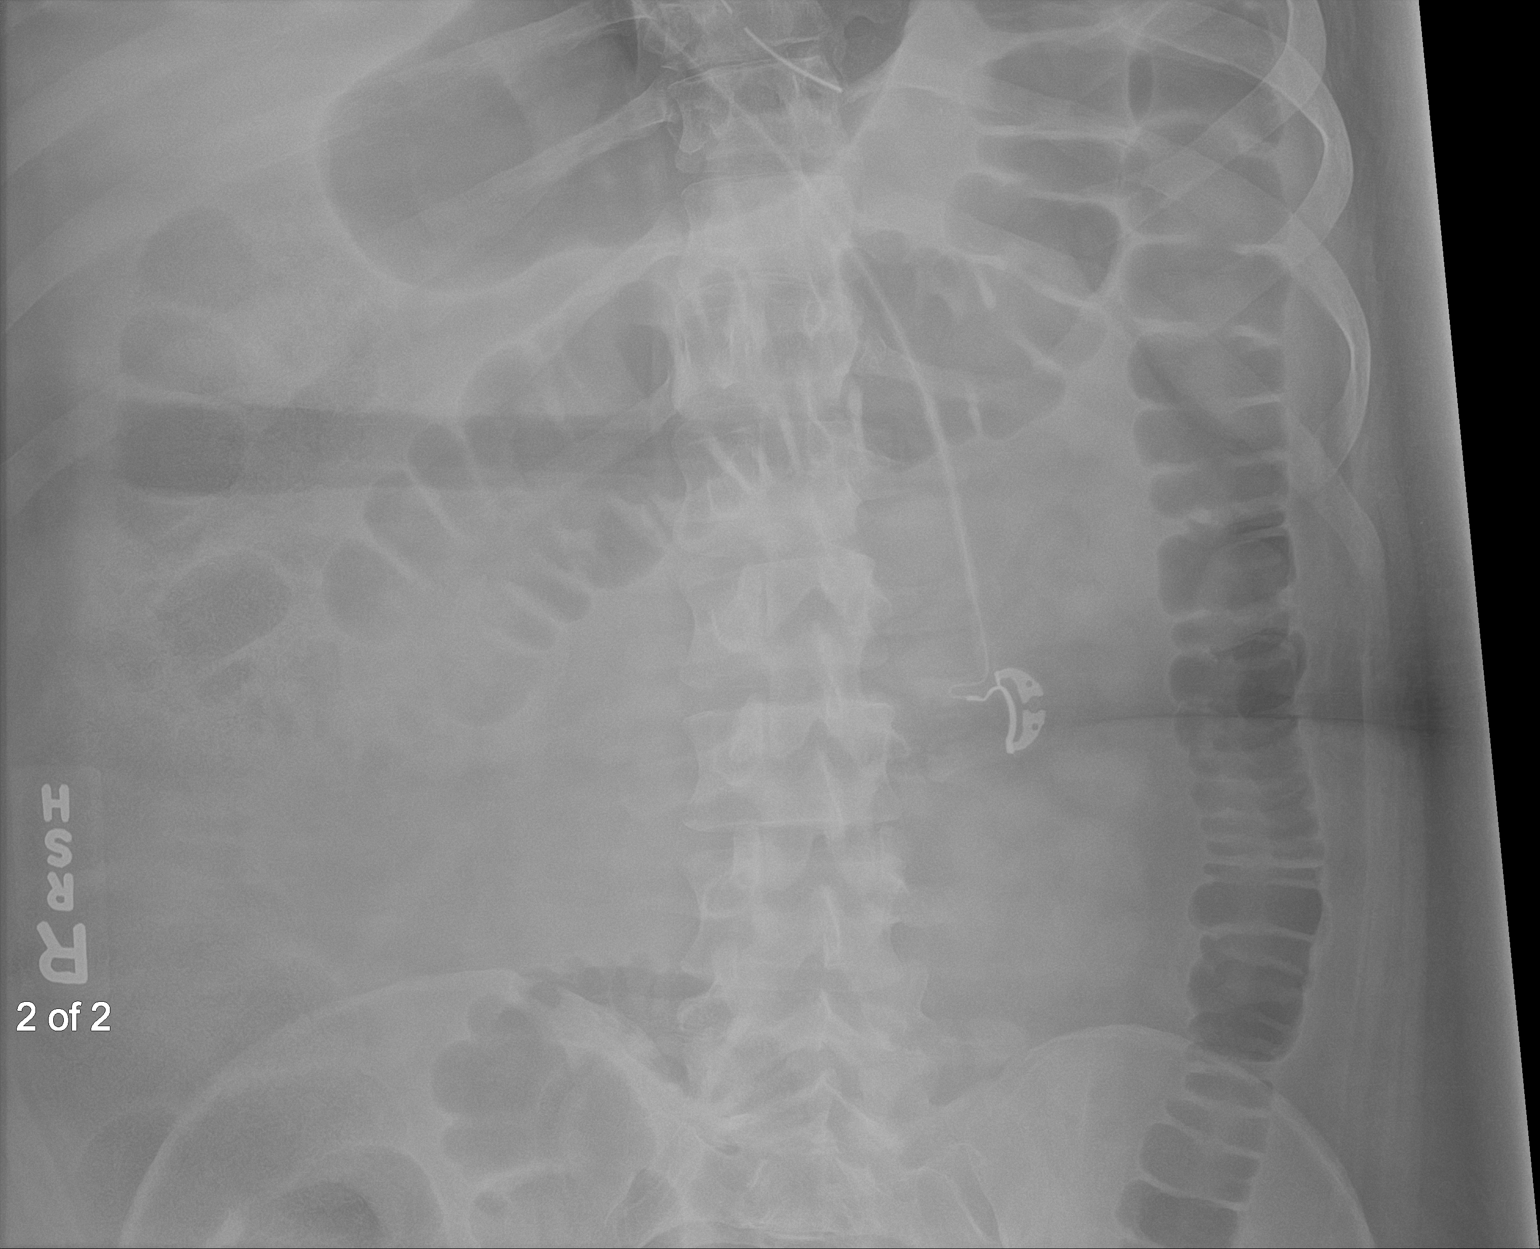

[abdomen kub (2 of 2)]
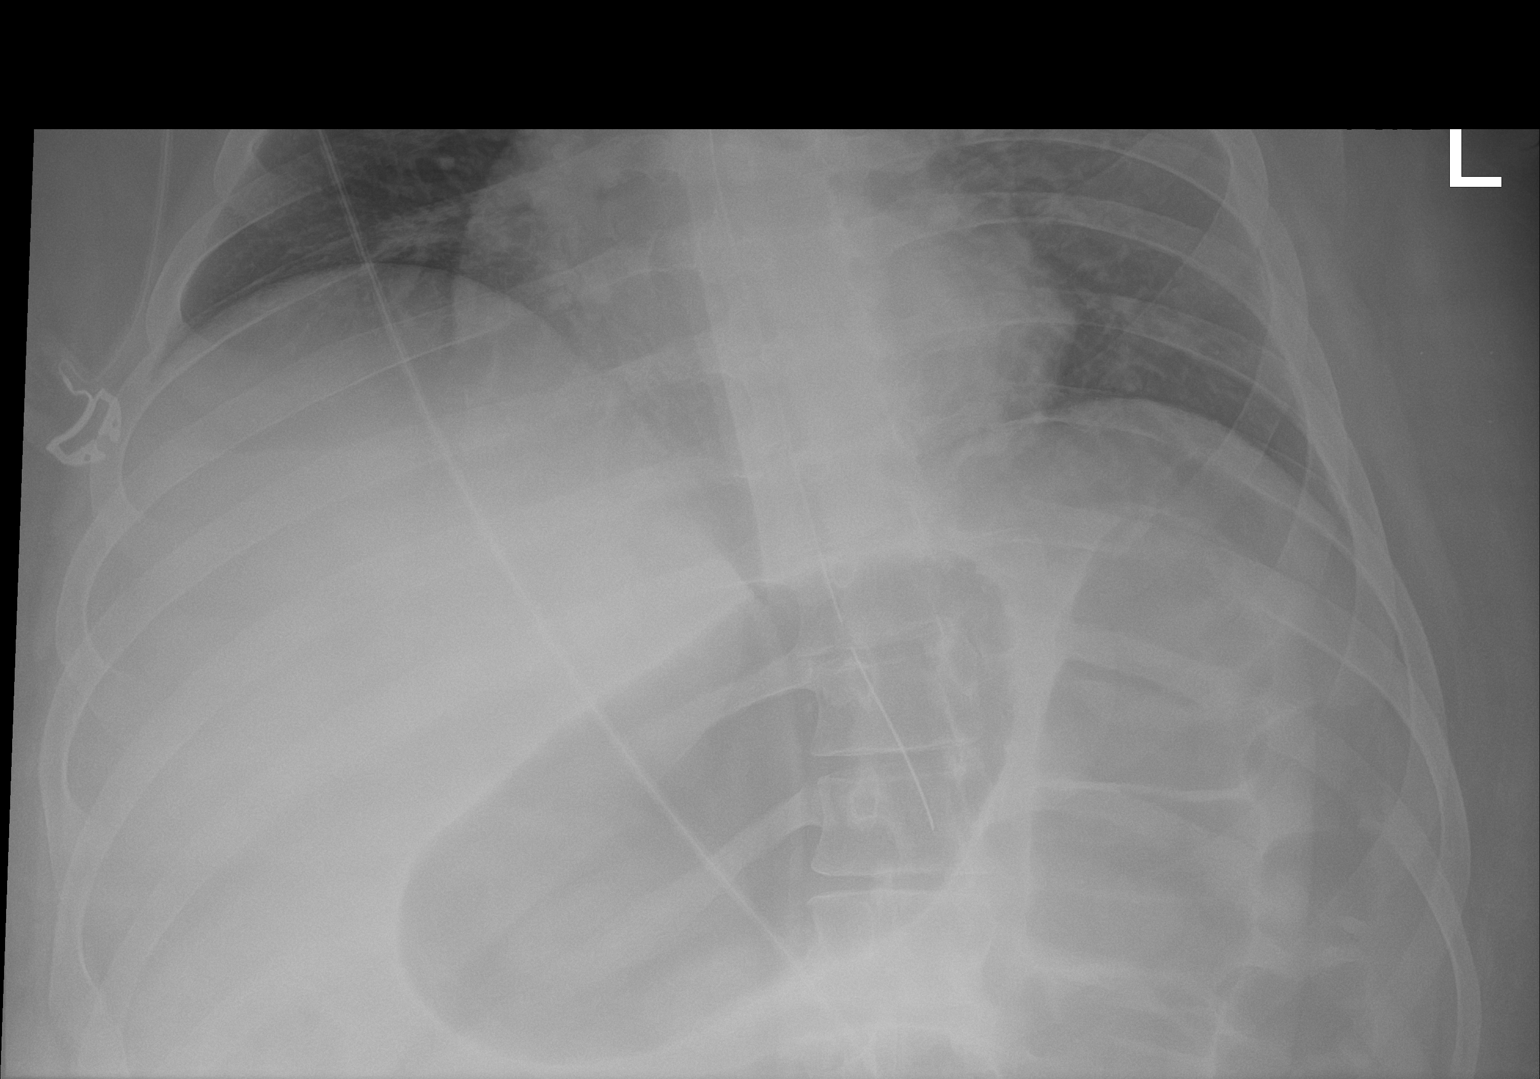

[2 of 2 positions shown; findings below may reference images not displayed]

FINDINGS: [OE] hours. Two views are submitted. The tip of the endotracheal
tube is unchanged, overlying the mid stomach. The stomach and colon
are mildly distended with gas. No significant small bowel distention
identified. There is no evidence of free intraperitoneal air.
Atelectasis is present at both lung bases.
IMPRESSION: Stable location of the nasogastric tube. No evidence of bowel
obstruction.

## 2018-12-14 IMAGING — CT CT T SPINE W/O CM
3 of 5 series · 12 of 33 positions shown, 14 images · non-contrast
Comparison: None.

CLINICAL DATA: Myelopathy, acute or progressive

EXAM:
CT THORACIC SPINE WITHOUT CONTRAST
TECHNIQUE: Multidetector CT images of the thoracic were obtained using the
standard protocol without intravenous contrast.

[Series 7: coronal st · coronal · 0.36mm/px · 3 of 87 slices shown]
[im 18/87  bone]
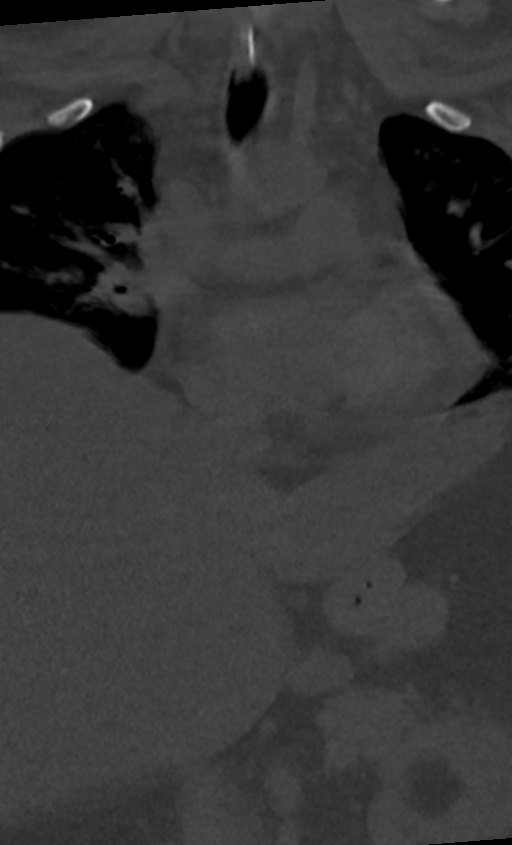
[im 35/87  bone]
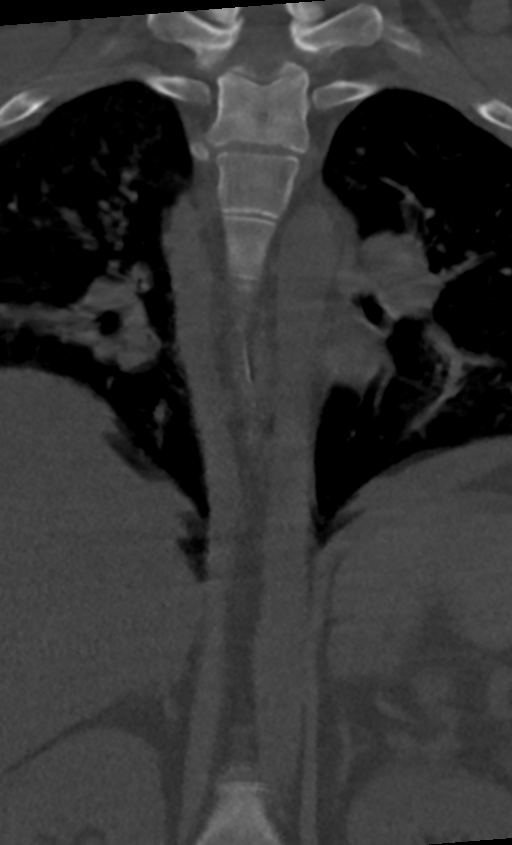
[im 52/87  bone]
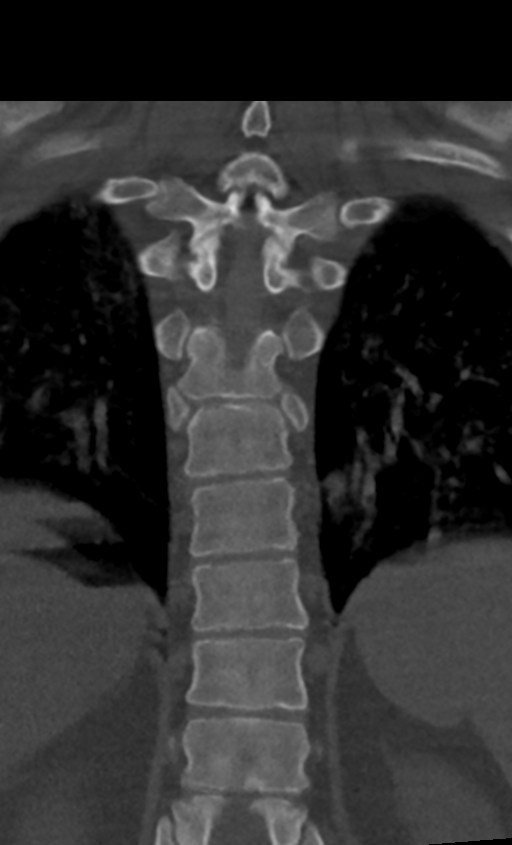

[Series 8: sagittal st · sagittal · 0.36mm/px · 5 of 71 slices shown, 6 images]
[im 24/71  bone]
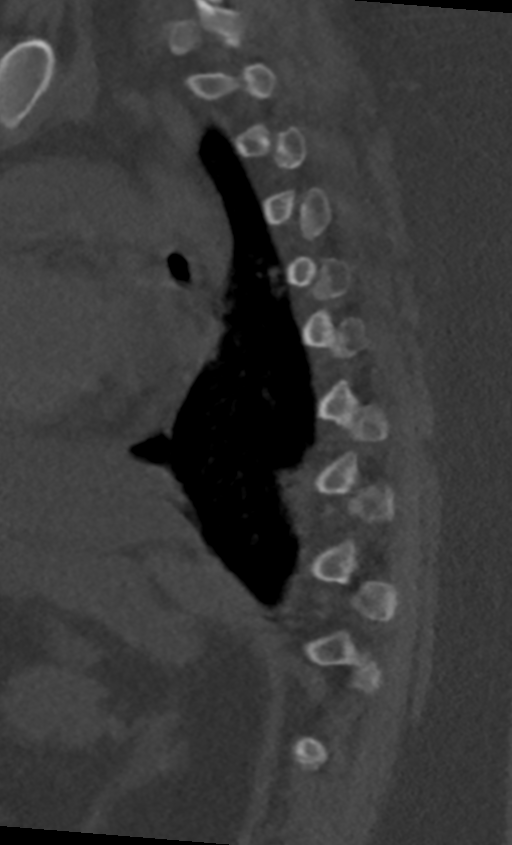
[im 30/71  bone]
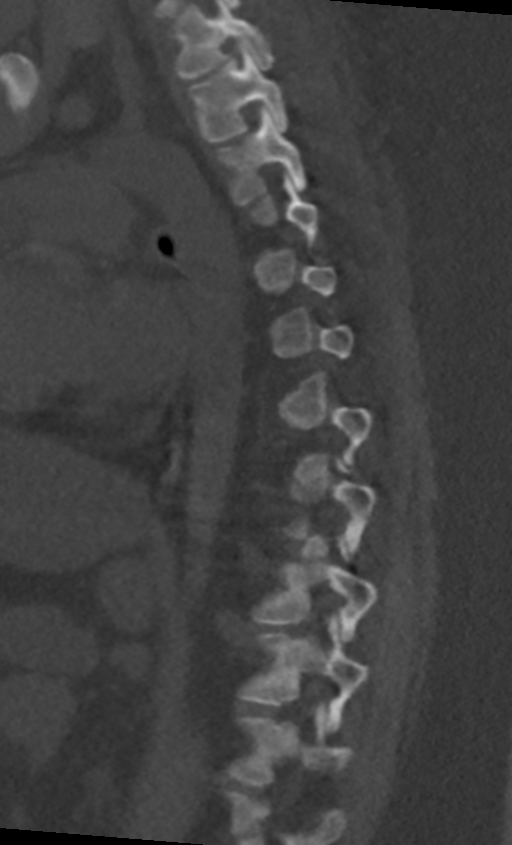
[im 36/71  soft-tissue]
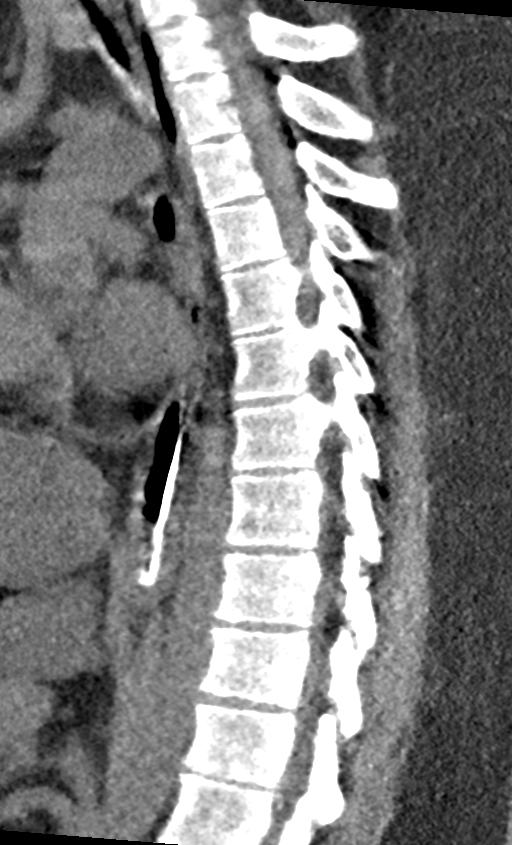
[im 36/71  bone]
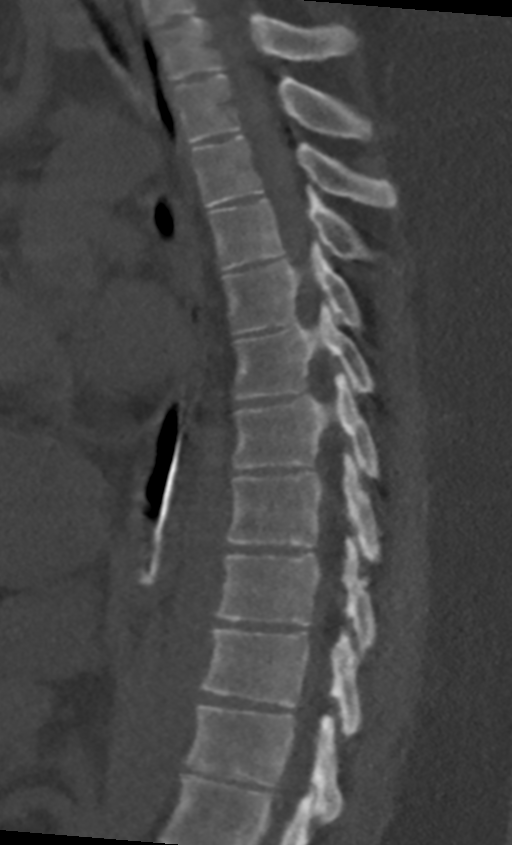
[im 41/71  bone]
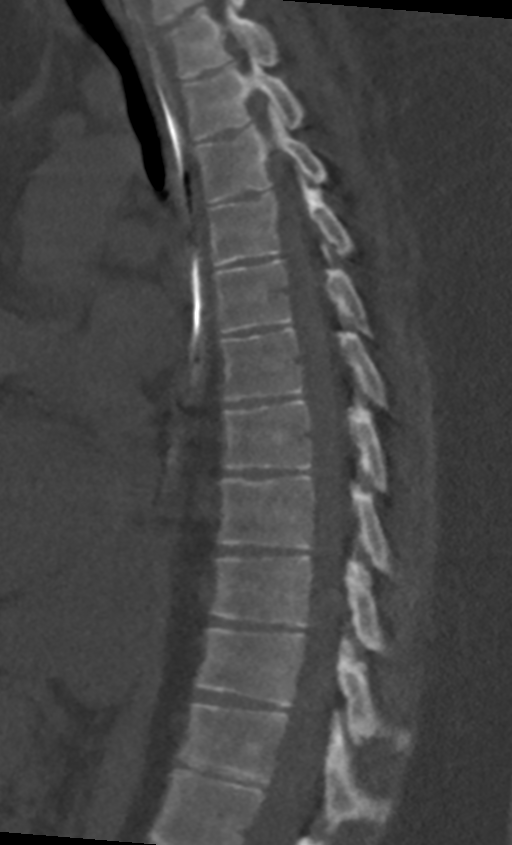
[im 47/71  bone]
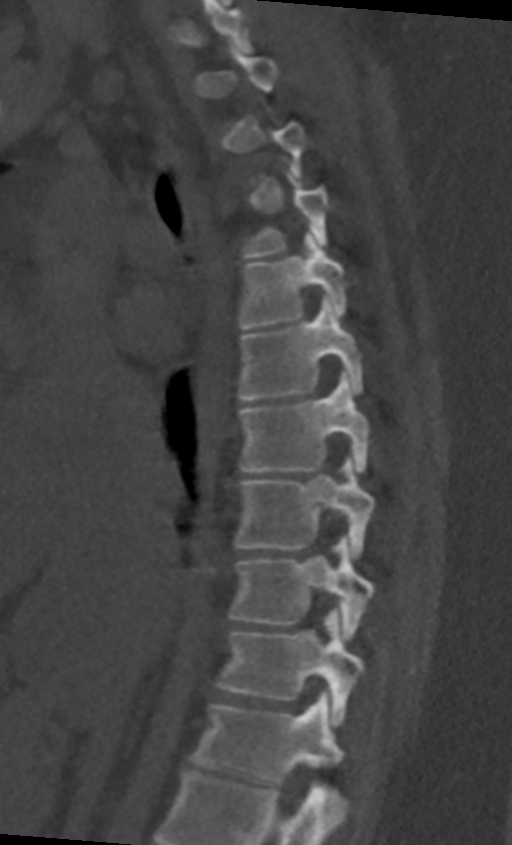

[Series 10: t-spine 1.0 st · axial · 0.52mm/px · z∈[-260,-33]mm · 4 of 605 slices shown, 5 images]
[im 76/605  soft-tissue]
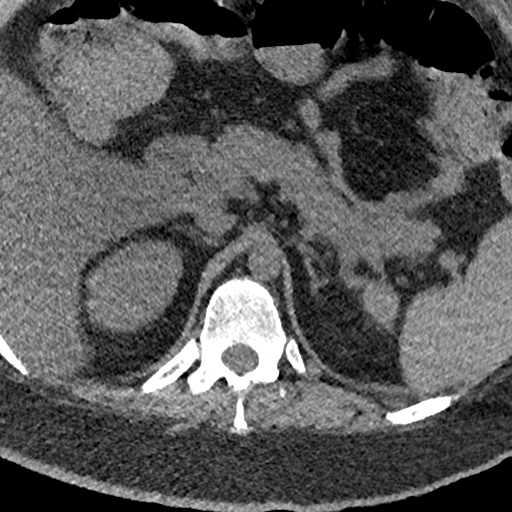
[im 76/605  bone]
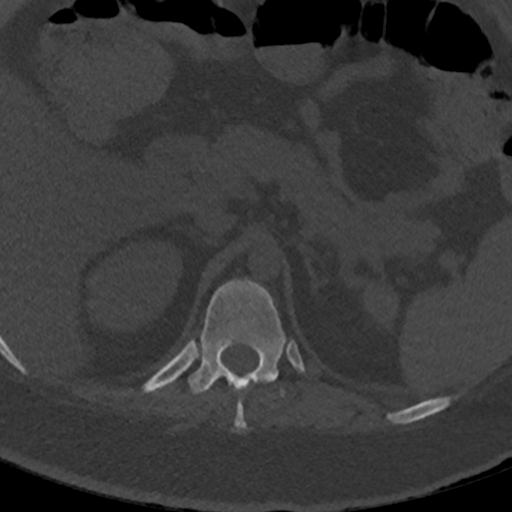
[im 227/605  bone]
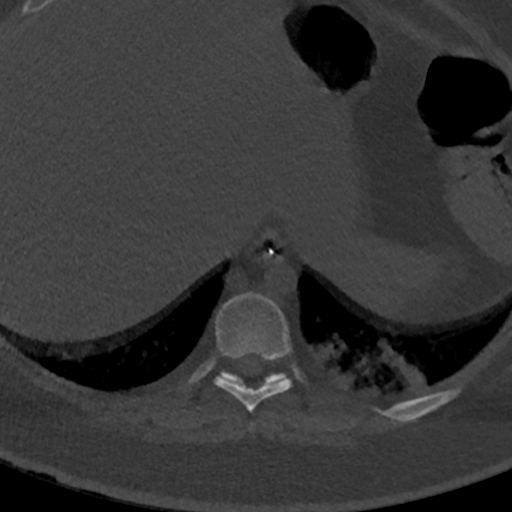
[im 378/605  bone]
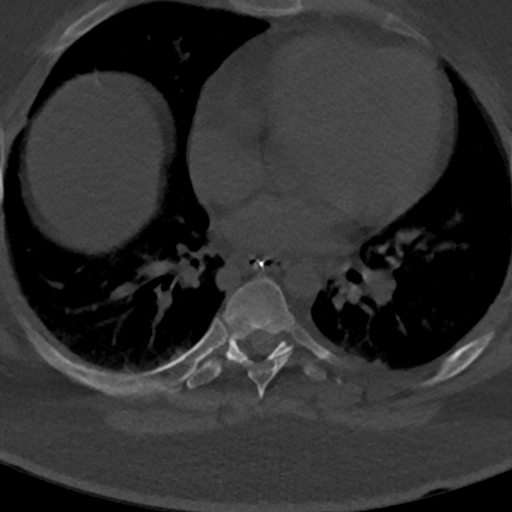
[im 529/605  bone]
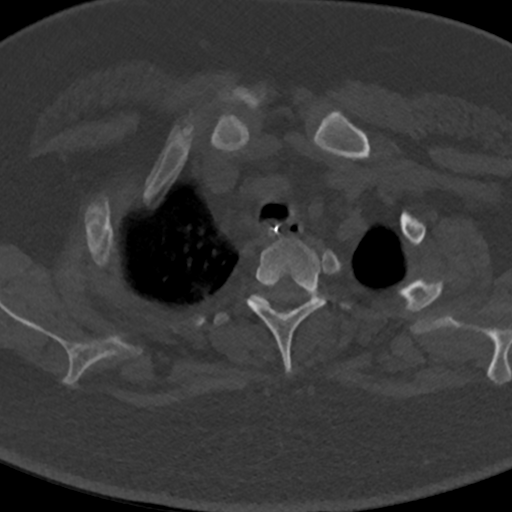

[12 of 33 positions shown; findings below may reference images not displayed]

FINDINGS: Alignment: Normal

Vertebrae: Normal vertebra.  No fracture or mass.

Paraspinal and other soft tissues: Negative for paraspinous mass or
adenopathy. NG tube in place

Left lower lobe small infiltrate most likely pneumonia.

Fatty liver with hepatomegaly.

Disc levels: Disc spaces are well preserved. No significant disc
degeneration or spurring. Negative for spinal stenosis.
IMPRESSION: Negative CT thoracic spine

Left lower lobe infiltrate, probable pneumonia

## 2018-12-14 MED ORDER — LIDOCAINE HCL (PF) 1 % IJ SOLN
INTRAMUSCULAR | Status: AC
  Filled 2018-12-14: qty 5

## 2018-12-14 MED ORDER — LORAZEPAM 2 MG/ML IJ SOLN
INTRAMUSCULAR | Status: AC
  Filled 2018-12-14: qty 1

## 2018-12-14 MED ORDER — WHITE PETROLATUM EX OINT
TOPICAL_OINTMENT | CUTANEOUS | Status: DC | PRN
  Administered 2018-12-14: 19:00:00 via TOPICAL
  Administered 2018-12-16 – 2019-01-20 (×4): 0.2 via TOPICAL
  Filled 2018-12-14 (×2): qty 28.35

## 2018-12-14 MED ORDER — LIDOCAINE HCL (PF) 1 % IJ SOLN: 5.0000 mL | Freq: Once | INTRAMUSCULAR | Status: DC

## 2018-12-14 NOTE — Progress Notes (Signed)
Unable to perform 1600 assessment as pt is off unit in a procedure.

## 2018-12-14 NOTE — Progress Notes (Signed)
 Nutrition Follow-up  DOCUMENTATION CODES:   Severe malnutrition in context of acute illness/injury  INTERVENTION:   Recommend re-initiation trial of trickle TF as able. Recommend considering post-pyloric tube placement if unable to tolerate gastric feedings.   Tube Feeding Recommendations as able:  Initiate Vital 1.5 at 20 ml/hr with advancement as tolerated to goal 60 ml/hr Pro-Stat 30 mL BID Provides 122 g of protein, 2360 kcals, 1094 mL of free water Meets 100% of estimated calorie and protein needs  Recommend considering checking a Zinc level as well   NUTRITION DIAGNOSIS:   Severe Malnutrition related to acute illness as evidenced by energy intake < or equal to 50% for > or equal to 5 days, percent weight loss.  Being addressed via nutrition support  GOAL:   Patient will meet greater than or equal to 90% of their needs  Not Met  MONITOR:   TF tolerance, Diet advancement, Labs, Weight trends  REASON FOR ASSESSMENT:   Consult Enteral/tube feeding initiation and management, Assessment of nutrition requirement/status  ASSESSMENT:   23 yo male with autism and epilepsy with 6 week hx of rapid decline in cognition and gait in addition to new peripheral neuropathic symptoms since the onset of intractable N/V for 6 weeks. Per neurology, findings most consistent with acute thiamine deficiency, pt with B12 deficiency as well. Pt is nonverbal at baseline. Additional PMH includes fatty liver, HTN, pancreatitis, OSA  Pt obtunded. Mother at bedside  +fever with no source of infection; plan for LP today  Mother reports pt has been vomiting for 2 months, unable to keep anything down. Mother indicates that this all began after pt fell and bumped his head and had a seizure. Prior to this, pt eating well. She indicates that pt "loved to eat." Pt liked a variety of foods but did not like vegetables and did not eat any dairy. Pt ate lots of chicken and liked fruits. Pt did not really  eat breads or sweets but loved plain cheerios and would snack on these all day long. Pt drank water and sprite. No alcohol or tobacco use. Pt did not take any vitamins or supplements.   Mother does confirm that pt has not been able to walk since the fall and can barely hold a cup.   Pt has experienced 50 pound wt loss; 13% wt loss in 2 months  NG tube in place, LIS. Pt has not tolerated trickle TF, vomited this AM and yesterday  Abd xray this AM showing mild gastric and colonic distention. Chest xray initially showing NG tube had been retracted and was located in esophagus, later abd xray indicating advancement in stomach.   No BM since admission. +enema today with little result. Mother reports last BM was on admission to [facility] on Oct 22nd when pt required disimpaction.   Micronutrients:  Vitamin B-12: 159 (L) supplementing Thiamine: pending (supplementing) Copper: 122 (wdl)  Labs: reviewed Meds: B-12 injections, LR at 50 ml/hr, thiamine   NUTRITION - FOCUSED PHYSICAL EXAM:    Most Recent Value  Orbital Region  No depletion  Upper Arm Region  No depletion  Thoracic and Lumbar Region  No depletion  Buccal Region  No depletion  Temple Region  No depletion  Clavicle Bone Region  No depletion  Clavicle and Acromion Bone Region  No depletion  Scapular Bone Region  No depletion  Dorsal Hand  No depletion  Patellar Region  No depletion  Anterior Thigh Region  No depletion  Posterior Calf Region  No depletion  Edema (RD Assessment)  Mild  Hair  Reviewed  Eyes  Reviewed  Mouth  -- [difficult to asses, pt obtunded, could not observe tongue]  Skin  Reviewed  Nails  Reviewed       Diet Order:   Diet Order            Diet NPO time specified  Diet effective now              EDUCATION NEEDS:   Not appropriate for education at this time  Skin:  Skin Assessment: Reviewed RN Assessment  Last BM:  11/20- small mucousy result post enema, otherwise no recent BM  Height:   Ht  Readings from Last 1 Encounters:  12/07/2018 _0  (1.803 m)    Weight:   Wt Readings from Last 1 Encounters:  12/14/18 (!) 148.7 kg    Ideal Body Weight:  78.1 kg  BMI:  Body mass index is 45.72 kg/m.  Estimated Nutritional Needs:   Kcal:  2200-2500 kcals  Protein:  110-135 g  Fluid:  >/= 2 L     Alaya Iverson MS, RDN, LDN, CNSC [phone] Pager  [phone] Weekend/On-Call Pager

## 2018-12-14 NOTE — Progress Notes (Signed)
Per CXR, NG in esophagus. Advanced to pre-marked (taped) area on tube. Air auscultated in stomach. ABD xray ordered. Holding PO meds until xray performed and read.

## 2018-12-14 NOTE — Progress Notes (Addendum)
NEUROLOGY PROGRESS NOTE  Subjective:  Patient still lethargic, not following commands. Patient moves bilateral extremities spontaneously. NGT in place. Plan for LP today pending INR level.   Exam: Vitals:   12/14/18 0800 12/14/18 0834  BP: (!) 125/93   Pulse: (!) 135   Resp: (!) 24   Temp:  98.7 F (37.1 C)  SpO2: 93%     ROS Unable to be obtained secondary to nonverbal   Physical Exam  Constitutional: Appears well-developed and well-nourished.  Psych: Nonverbal and lethargic Eyes: No scleral injection HENT: No OP obstrucion Head: Normocephalic.  Cardiovascular: Tachycardic Respiratory: non- labored breathing, but Respirations 27. GI: Soft.  distension.   Skin: WDI   Neuro:  Mental Status:  Patient is nonverbal. opens eyes spontaneously. Did not shake his head for my exam today.  Not following commands. Grimaces and open eyes to noxious. Moving BUE spontaneously. No spontaneous movements noted in BLE. Cranial Nerves: II: Blinks to threat III,IV, VI:  pupils equal round reactive.  When eyelids are open patient has vertical nystagmus again fast up beating still present. V,VII: Face equal and winces to pain Motor/ sensory: patient grimaces to pain in all 4 extremities. No withdrawal noted.  Deep Tendon Reflexes: 2+ bilateral upper extremities with no knee jerk or ankle jerk Plantars: Mute bilaterally, but patient smiled when testing plantars.   Medications:  Scheduled: . chlorhexidine  15 mL Mouth Rinse BID  . [START ON 12/15/2018] Chlorhexidine Gluconate Cloth  6 each Topical Q0600  . cloBAZam  60 mg Per Tube BID  . cyanocobalamin  1,000 mcg Subcutaneous Daily  . feeding supplement (VITAL 1.5 CAL)  1,000 mL Per Tube Q24H  . heparin injection (subcutaneous)  5,000 Units Subcutaneous Q8H  . levothyroxine  25 mcg Per Tube Q0600  . mouth rinse  15 mL Mouth Rinse q12n4p  . nystatin   Topical Daily  . pantoprazole sodium  40 mg Per Tube BID  . polyethylene glycol  17 g  Per Tube BID  . sennosides  5 mL Per Tube BID  . sodium chloride flush  10-40 mL Intracatheter Q12H  . sodium chloride flush  3 mL Intravenous Q12H  . [START ON 12/17/2018] thiamine  100 mg Per Tube Daily   Continuous: . sodium chloride Stopped (12/12/18 2140)  . lactated ringers 50 mL/hr at 12/14/18 0800  . levETIRAcetam Stopped (12/13/18 2303)  . thiamine injection      Pertinent Labs/Diagnostics:  Serum copper WNL Thiamine pending INR: pending  Unable to obtain thoracic and lumbar spine MRI secondary to patient unable to remain still. (holding MRI for now)   Assessment: 23 year old autistic male with known epilepsy, nonverbal at baseline who presented with 6-week history of rapid decline in cognition and gait in addition to new onset peripheral neuropathic symptoms since the onset of intractable nausea/vomiting for approximately 6 weeks.  As noted prior, he sustained an approximate 50 pound weight loss over that time. 1.  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 2.  MRI brain findings also consistent with Wernicke's encephalopathy 3.  Severe B12 deficiency with level of 159.  This most likely is contributing to or may be the sole etiology of bilateral lower extremity weakness and areflexia seen on exam. 4. AIDP or CIDP would be unlikely given confirmed B12 deficiency and high likelihood  of acute thiamine deficiency as the etiologies for the patient's lower extremity areflexia and weakness. However, will need LP under fluoroscopy for cell count, protein, glucose and IgG index to rule out an autoimmune demyelinating polyneuropathy (evaluate CSF for albuminocytologic dissociation). Will also need LP as patient has a FUO in conjunction with encephalopathy (although the  latter is most likely secondary to acute thiamine deficiency).  5.  MRI brain and cervical spine did not reveal any lesions to explain his bilateral lower extremity weakness and areflexia 6.  EEG did not show any epileptiform activity thus high likelihood this is not subclinical seizures. 7. Unable to obtain MRI of thoracic and lumbar spine without anesthesia/intubation as patient unable to tolerate lying flat and could not hold still during yesterday's imaging attempt.    Recommendations: -Patient continues to have fever with no source of infection found ( t-max past 24 hours 38.1c) -B12 injection 1000 mcg daily x7 days followed by weekly for 1 month prior to rechecking levels and assessing supplementation requirements -He has been started on high-dose thiamine 500 mg IV 3 times daily for 3 days  ( 1st dose 12/14/18) then decrease to 250 mg IV 3 times daily for 3 days and at that point continue supplemental 100 mg daily thereafter. -Will obtain fluoroscopy guided LP to assess for possible albuminocytologic dissociation as well as for gram stain, cultures and white count-this was explained to mother who is in the room -Continue Onfi via tube and Keppra 1000 mg IV twice daily  Addendum: -- LP completed under fluoro. Bloody tap with elevated protein. 0 WBC. Unclear if this represents artifactual protein elevation due to RBCs in bloody tap, versus true albuminocytologic dissociation.  -- As LP is not informative and unable to obtain thoracic and lumbar MRI, will order CT of thoracic and lumbar spine.   Electronically signed: Dr. [name]

## 2018-12-14 NOTE — Progress Notes (Signed)
Palliative: Mr. Remington, [place], is resting quietly in bed.  He appears morbidly obese, acutely/chronically ill, and frail.  He is nonverbal at baseline, but is unable to make his basic needs known at this time.  His mother is at bedside.  We talk in detail about his acute and chronic health concerns.  [name] shares her feelings about Zaydenn's decline since he had a seizure and hit his head.  She shares since this happened in August he has really declined.  We talked about the treatment plan in detail including but not limited to images, labs, medical interventions.  We talked about lumbar puncture.  We talked about CODE STATUS, "treat the treatable but allowing natural passing".  I share my concern that if we are doing all these things for Kwesi and he still worsens, CPR and intubation would likely not change things.  At this point, [name] would like to continue full scope/full code.  She states that if she has to unburden [employer] from the ventilator, she and her mother will discuss.  [name] shares that she wants to give "my baby" every chance to recover.  Psychosocial support provided with patient, family, nursing staff.  Conference with bedside nursing staff and attending related to goals of care discussion.  Plan:  Continue to treat the treatable, FULL SCOPE/CODE PMT to follow after weekend.   71 minutes Quinn Axe, NP Palliative Medicine Team Team Phone # [phone] Greater than 50% of this time was spent counseling and coordinating care related to the above assessment and plan.

## 2018-12-14 NOTE — Progress Notes (Signed)
NAME:  Aaron Vega, MRN:  [id number], DOB:  08/20/1995, LOS: 6 ADMISSION DATE:  11/30/2018, CONSULTATION DATE:  12/10/2018 REFERRING MD:  Lajuana RippleKamineni - TR[facility], CHIEF COMPLAINT:  Rising lactate level.  Brief History   23 year old autistic man with abdominal distension and vomiting.  History of present illness   Known for poor and declining health status from severe autism with seizures, severe constipation recently.  His QoL was significantly better prior to 08/2018 when he suffered a severe seizure at home and struck his head. Seen in ED only but has has worsening mental status since. Per chart, this was ascribed to  hypoxic ischemic encephalopathy following prolonged seizure.   Presented via ED for increasing lethargy and vomiting and decreased urine output.   Significant disconnect regarding goals of care as currently receiving hospice care at home but still full code and mother not yet willing to place limits on care.  Was unable to have MRI performed 12/12/2018 secondary to agitation and risk of respiratory depression with sedation  Past Medical History   Past Medical History:  Diagnosis Date  . Autistic disorder, current or active state   . Fatty liver   . Hypertension   . Obesity   . Pancreatitis    at age 21 years  . Pneumonia   . Pre-diabetes   . Seizures (HCC)    as of 11/23/15 - no seizures for 3 month  . Sleep apnea    not on cpap (can't tolerate)  . Tics of organic origin   . Tourette syndrome    Past Surgical History:  Procedure Laterality Date  . BIOPSY  11/08/2018   Procedure: BIOPSY;  Surgeon: Beverley FiedlerPyrtle, Jay M, MD;  Location: [facility];  Service: Gastroenterology;;  . ESOPHAGOGASTRODUODENOSCOPY (EGD) WITH PROPOFOL N/A 11/08/2018   Procedure: ESOPHAGOGASTRODUODENOSCOPY (EGD) WITH PROPOFOL;  Surgeon: Beverley FiedlerPyrtle, Jay M, MD;  Location: [facility];  Service: Gastroenterology;  Laterality: N/A;  . FLEXIBLE SIGMOIDOSCOPY N/A 11/21/2018   Procedure: FLEXIBLE  SIGMOIDOSCOPY;  Surgeon: Lemar LoftyMansouraty, Gabriel Jr., MD;  [facility];  Service: Gastroenterology;  Laterality: N/A;  . IMPACTION REMOVAL  11/21/2018   Procedure: IMPACTION REMOVAL;  Surgeon: Lemar LoftyMansouraty, Gabriel Jr., MD;  [facility];  Service: Gastroenterology;;  . RADIOLOGY WITH ANESTHESIA N/A 11/24/2015   Procedure: CT SCAN ABDOMEN AND PELVIS WITH CONTRAST;  Surgeon: Medication Radiologist, MD;  Location: [facility];  Service: Radiology;  Laterality: N/A;  . TONSILLECTOMY     and adenoidectomy    Significant Hospital Events   [facility] 11/15 increasing lactate despite fluids. 12/10/2018 transfer to intensive care unit  Consults:  PCCM 11/16. Urology 12/10/2018 Neurology  Procedures:  11/09/2018 NG tube placed per interventional radiology 12/14/2018 plan for lumbar puncture per interventional radiology>> Significant Diagnostic Tests:  11/14 - CT abdomen/pelvis: obese, small LLL infiltrate, dilated gas filled loops of small and large bowel to rectum, no transition. No gastric distension, moderate stool burden in ascending colon. 12/10/2018 MRI brain cervical vertebrae with abnormal T2 signal restricted diffusion both medial thalami most likely diagnosis of Warnicke's encephalopathy  Micro Data:  SARS-Cov2 -negative Blood cultures -no growth to date Urine culture - negative  Antimicrobials:  Cefepime 11/14 - 11/18 Flagyl 11/14-11/18  Interim history/subjective:  Currently asleep with a heart rate of 133 respiratory rate of 26 adequate saturations  Objective   Blood pressure (!) 127/114, pulse (!) 134, temperature (!) 100.6 F (38.1 C), temperature source Axillary, resp. rate (!) 23, height 5\' 11"  (1.803 m), weight Marland Kitchen(!)  148.7 kg, SpO2 98 %.        Intake/Output Summary (Last 24 hours) at 12/14/2018 0802 Last data filed at 12/14/2018 0700 Gross per 24 hour  Intake 1669.6 ml  Output 2980 ml  Net -1310.4 ml   Filed Weights   11/30/2018 1418  12/13/18 0500 12/14/18 0344  Weight: 135.2 kg 134.7 kg (!) 148.7 kg    Examination: General: Morbidly obese male who is asleep at time of examination with a heart rate of 133 respiratory rate of 28 HEENT: Short neck. Neuro: Currently sleeping CV: Sinus tach 34 PULM: Managed in the bases, upper airway mild obstruction consistent with sleep apnea GI: soft, bsx4 active, obese slight bowel movement from soapsuds enema 12/13/2018 we will repeat today Extremities: warm/dry, 2+ edema  Skin: no rashes or lesions   Chest x-ray with low lung volumes demonstrates feeding tube at the distal esophagus on 12/14/2018  Resolved Hospital Problem list   Hypernatremia  Assessment & Plan:  Acute on chronic respiratory failure Obstructive sleep apnea history, not tolerant of positive pressure ventilation Minimize sedation  Titrate FiO2 to keep sats greater than 90%   Possible aspira minimize sedation minimize sedation/EP associat minimize sedationeMinimize sedationtion pneumonia Currently off antimicrobial therapy Chest x-ray with low lung volumes Note feeding tube is in the distal esophagus have asked the nurse to advance 12/14/2018   Suspected sepsis Procalcitonin 0.36 Lactic acid of 7 T-max of 100.3 Continue to monitor off antimicrobial therapy  Encephalopathy Wernicke's encephalopathy secondary thiamine deficiency Continue high-dose thiamine Continue antiseizure medication Neurology is on board  50 pound weight loss, small bowel ileus Tube feedings are on hold Scant results from soapsuds enema Continue Dulcolax suppository Repeat soapsuds enema 12/14/2018 May need further pharmaceutical intervention  Patient was scheduled for MRI yesterday 11/18 12/14/2018 plan for lumbar puncture today in interventional radiology    Daily Goals Checklist  Pain/Anxiety/Delirium protocol (if indicated): As needed lorazepam for seizures Respiratory support goals: Nasal cannula DVT  prophylaxis: UFH tid Nutritional status and feeding goals: Continue trickle feeds, once able to move bowels may increase GI prophylaxis: Protonix twice daily Fluid status goals: Lactated Ringer Central lines: LUE midline Glucose control: 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 Monitor when start feeding. Mobility/therapy needs: Bedrest Antibiotic de-escalation: Antibiotics discontinued 12/12/2018 Home medication reconciliation: Restart Omfi once enteral access available Daily labs: BMP daily Code Status: Full code Family Communication: Discussed with mom at bedside Disposition: ICU  Labs   CBC: Recent Labs  Lab 12/04/2018 1425 12/09/18 0114 12/10/18 0250 12/11/18 0659  WBC 9.1 5.6 4.6 5.0  NEUTROABS 6.4 3.9 2.8 3.1  HGB 14.0 12.4* 10.9* 10.5*  HCT 42.3 37.3* 33.3* 32.0*  MCV 82.1 82.9 84.5 84.4  PLT 603* 358 331 660    Basic Metabolic Panel: Recent Labs  Lab 12/09/18 0114 12/10/18 0250 12/11/18 0659 12/11/18 1719 12/11/18 1836 12/12/18 0532 12/12/18 1648 12/13/18 0633  NA 135 137 151*  --  151*  --   --  138  K 3.5 4.0 3.7  --  3.3*  --   --  4.4  CL 98 104 111  --  116*  --   --  98  CO2 23 16* 26  --  23  --   --  29  GLUCOSE 107* 90 82  --  87  --   --  112*  BUN 14 10 17   --  17  --   --  5*  CREATININE 0.56* 0.71 0.59*  --  0.60*  --   --  0.47*  CALCIUM 8.5* 8.4* 8.4*  --  8.3*  --   --  8.5*  MG  --   --   --  1.8  --  1.8 1.5* 2.2  PHOS  --   --   --  2.2*  --  2.6 3.1 3.6   GFR: Estimated Creatinine Clearance: 216.3 mL/min (A) (by C-G formula based on SCr of 0.47 mg/dL (L)). Recent Labs  Lab 2018/12/26 1425  12/09/18 0114  12/10/18 0250 12/10/18 0545 12/10/18 1107 12/10/18 1702 12/11/18 0659 12/11/18 1029  PROCALCITON  --   --  0.36  --   --   --   --   --   --   --   WBC 9.1  --  5.6  --  4.6  --   --   --  5.0  --   LATICACIDVEN  --    < > 4.7*   < >  --  8.1* 7.2* 5.3*  --  1.6   < > = values in this interval not displayed.    Liver Function Tests:  Recent Labs  Lab Dec 26, 2018 1425 12/09/18 0114 12/10/18 0250 12/11/18 0659  AST 93* 71* 69* 84*  ALT 159* 124* 109* 121*  ALKPHOS 49 39 35* 32*  BILITOT 1.1 1.3* 0.8 1.2  PROT 6.5 5.6* 5.4* 5.0*  ALBUMIN 3.5 2.9* 2.5* 2.6*   No results for input(s): LIPASE, AMYLASE in the last 168 hours. No results for input(s): AMMONIA in the last 168 hours.  ABG    Component Value Date/Time   PHART 7.355 12/10/2018 0912   PCO2ART 31.7 (L) 12/10/2018 0912   PO2ART 97.5 12/10/2018 0912   HCO3 16.8 (L) 12/10/2018 0912   ACIDBASEDEF 7.3 (H) 12/10/2018 0912   O2SAT 97.2 12/10/2018 0912    App cct 30 min  Brett Canales Aidah Forquer ACNP [facility] PCCM

## 2018-12-14 NOTE — Progress Notes (Addendum)
[facility] Liaison note Telephone call to patient's mother [name], message left to offer support. Writer encouraged her to contact hospice [phone] over the weekend for any needs. Chart notes reviewed, Palliative NP support very much appreciated. [name] continues to want full scope of treatment at this time. Please contact [facility] hosp[facility] at [phone] with any hospice needs Flo Shanks BSN, RN, [place]

## 2018-12-15 ENCOUNTER — Inpatient Hospital Stay (HOSPITAL_COMMUNITY)

## 2018-12-15 ENCOUNTER — Other Ambulatory Visit (HOSPITAL_COMMUNITY): Payer: Medicaid Other

## 2018-12-15 ENCOUNTER — Encounter (HOSPITAL_COMMUNITY): Payer: Medicaid Other

## 2018-12-15 DIAGNOSIS — E512 Wernicke's encephalopathy: Secondary | ICD-10-CM | POA: Diagnosis not present

## 2018-12-15 DIAGNOSIS — A419 Sepsis, unspecified organism: Secondary | ICD-10-CM | POA: Diagnosis not present

## 2018-12-15 DIAGNOSIS — R6520 Severe sepsis without septic shock: Secondary | ICD-10-CM | POA: Diagnosis not present

## 2018-12-15 LAB — BASIC METABOLIC PANEL
Anion gap: 14 (ref 5–15)
BUN: <5 mg/dL — ABNORMAL LOW (ref 6–20)
CO2: 24 mmol/L (ref 22–32)
Calcium: 9 mg/dL (ref 8.9–10.3)
Chloride: 100 mmol/L (ref 98–111)
Creatinine, Ser: 0.43 mg/dL — ABNORMAL LOW (ref 0.61–1.24)
GFR calc Af Amer: >60 mL/min (ref >60)
GFR calc non Af Amer: >60 mL/min (ref >60)
Glucose, Bld: 79 mg/dL (ref 70–99)
Potassium: 5.3 mmol/L — ABNORMAL HIGH (ref 3.5–5.1)
Sodium: 138 mmol/L (ref 135–145)

## 2018-12-15 LAB — GLUCOSE, CAPILLARY
Glucose-Capillary: 75 mg/dL (ref 70–99)
Glucose-Capillary: 75 mg/dL (ref 70–99)
Glucose-Capillary: 80 mg/dL (ref 70–99)
Glucose-Capillary: 81 mg/dL (ref 70–99)
Glucose-Capillary: 82 mg/dL (ref 70–99)
Glucose-Capillary: 82 mg/dL (ref 70–99)
Glucose-Capillary: 84 mg/dL (ref 70–99)

## 2018-12-15 LAB — PHOSPHORUS: Phosphorus: 3.5 mg/dL (ref 2.5–4.6)

## 2018-12-15 LAB — MAGNESIUM: Magnesium: 1.8 mg/dL (ref 1.7–2.4)

## 2018-12-15 IMAGING — DX DG CHEST 1V PORT
1 series · 1 of 1 positions shown · non-contrast
Comparison: [DATE]

CLINICAL DATA: Respiratory failure.

EXAM:
PORTABLE CHEST 1 VIEW

[chest]
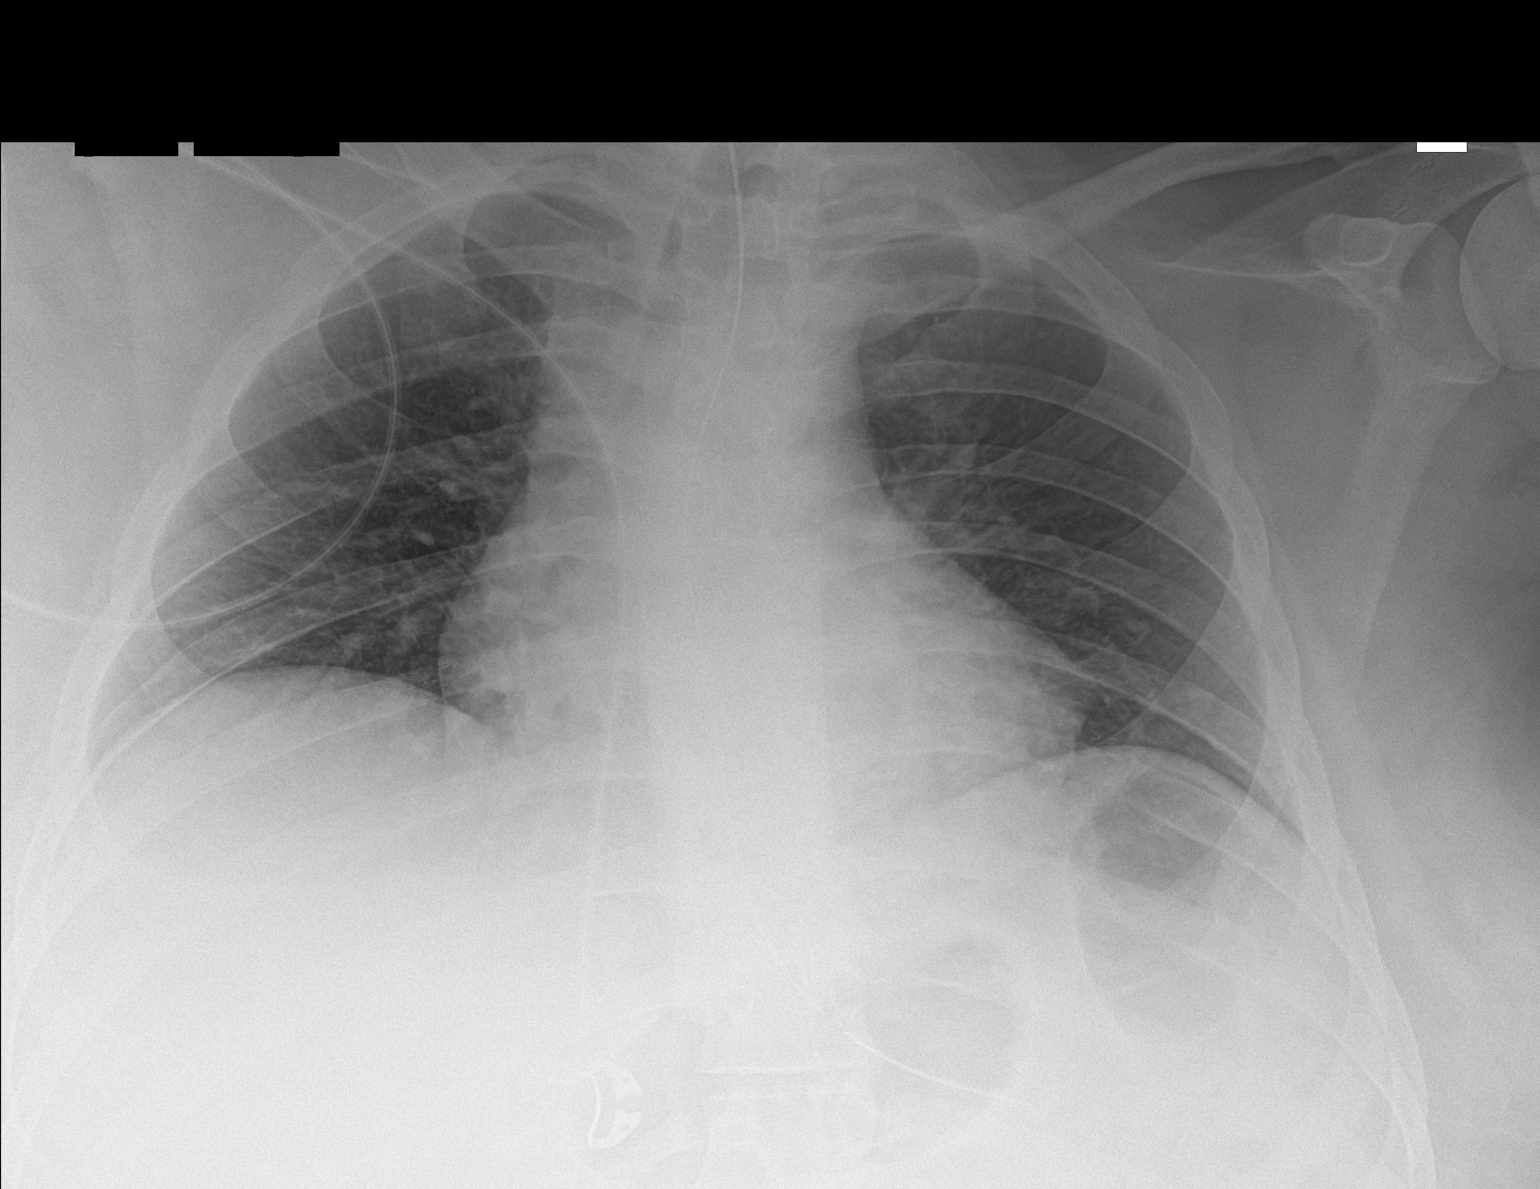

[1 of 1 positions shown; findings below may reference images not displayed]

FINDINGS: [51] hours. Low lung volumes. Interval improvement in left basilar
aeration. The lungs are clear without focal pneumonia, edema,
pneumothorax or pleural effusion. Cardiopericardial silhouette
accentuated by low volume AP technique. NG tube tip is in the mid
stomach. Telemetry leads overlie the chest.
IMPRESSION: Low volume film without acute cardiopulmonary findings.

## 2018-12-15 MED ORDER — IMMUNE GLOBULIN (HUMAN) 10 GM/100ML IV SOLN
400.0000 mg/kg | INTRAVENOUS | Status: DC
  Filled 2018-12-15: qty 600

## 2018-12-15 MED ORDER — SODIUM CHLORIDE 0.9 % IV BOLUS
1000.0000 mL | Freq: Once | INTRAVENOUS | Status: AC
  Administered 2018-12-15: 04:00:00 1000 mL via INTRAVENOUS

## 2018-12-15 MED ORDER — ERYTHROMYCIN ETHYLSUCCINATE 400 MG/5ML PO SUSR
400.0000 mg | Freq: Three times a day (TID) | ORAL | Status: DC
  Filled 2018-12-15: qty 5

## 2018-12-15 MED ORDER — IMMUNE GLOBULIN (HUMAN) 10 GM/100ML IV SOLN
400.0000 mg/kg | INTRAVENOUS | Status: AC
  Administered 2018-12-15 – 2018-12-18 (×4): 60 g via INTRAVENOUS
  Filled 2018-12-15 (×5): qty 600
  Filled 2018-12-15: qty 400

## 2018-12-15 MED ORDER — SODIUM CHLORIDE 0.9 % IV SOLN
500.0000 mg | Freq: Three times a day (TID) | INTRAVENOUS | Status: DC
  Administered 2018-12-15 – 2018-12-18 (×9): 500 mg via INTRAVENOUS
  Filled 2018-12-15 (×17): qty 10

## 2018-12-15 NOTE — Progress Notes (Addendum)
NAME:  Aaron Vega, MRN:  [id number], DOB:  03/12/1995, LOS: 7 ADMISSION DATE:  11/25/2018, CONSULTATION DATE:  12/10/2018 REFERRING MD:  Lajuana RippleKamineni - [facility], CHIEF COMPLAINT:  Rising lactate level.  Brief History   23 year old autistic man with abdominal distension and vomiting.  History of present illness   Known for poor and declining health status from severe autism with seizures, severe constipation recently.  His QoL was significantly better prior to 08/2018 when he suffered a severe seizure at home and struck his head. Seen in ED only but has has worsening mental status since. Per chart, this was ascribed to  hypoxic ischemic encephalopathy following prolonged seizure.   Presented via ED for increasing lethargy and vomiting and decreased urine output.   Significant disconnect regarding goals of care as currently receiving hospice care at home but still full code and mother not yet willing to place limits on care.  Was unable to have MRI performed 12/12/2018 secondary to agitation and risk of respiratory depression with sedation  Post lumbar puncture 12/14/2018  Past Medical History   Past Medical History:  Diagnosis Date  . Autistic disorder, current or active state   . Fatty liver   . Hypertension   . Obesity   . Pancreatitis    at age 23 years  . Pneumonia   . Pre-diabetes   . Seizures (HCC)    as of 11/23/15 - no seizures for 3 month  . Sleep apnea    not on cpap (can't tolerate)  . Tics of organic origin   . Tourette syndrome    Past Surgical History:  Procedure Laterality Date  . BIOPSY  11/08/2018   Procedure: BIOPSY;  Surgeon: Beverley FiedlerPyrtle, Jay M, MD;  Location: [facility];  Service: Gastroenterology;;  . ESOPHAGOGASTRODUODENOSCOPY (EGD) WITH PROPOFOL N/A 11/08/2018   Procedure: ESOPHAGOGASTRODUODENOSCOPY (EGD) WITH PROPOFOL;  Surgeon: Beverley FiedlerPyrtle, Jay M, MD;  Location: [facility];  Service: Gastroenterology;  Laterality: N/A;  . FLEXIBLE SIGMOIDOSCOPY N/A 11/21/2018    Procedure: FLEXIBLE SIGMOIDOSCOPY;  Surgeon: Lemar LoftyMansouraty, Gabriel Jr., MD;  [facility];  Service: Gastroenterology;  Laterality: N/A;  . IMPACTION REMOVAL  11/21/2018   Procedure: IMPACTION REMOVAL;  Surgeon: Lemar LoftyMansouraty, Gabriel Jr., MD;  [facility];  Service: Gastroenterology;;  . RADIOLOGY WITH ANESTHESIA N/A 11/24/2015   Procedure: CT SCAN ABDOMEN AND PELVIS WITH CONTRAST;  Surgeon: Medication Radiologist, MD;  Location: [facility];  Service: Radiology;  Laterality: N/A;  . TONSILLECTOMY     and adenoidectomy    Significant Hospital Events   [facility] 11/15 increasing lactate despite fluids. 12/10/2018 transfer to intensive care unit  Consults:  PCCM 11/16. Urology 12/10/2018 Neurology  Procedures:  11/09/2018 NG tube placed per interventional radiology 12/14/2018 lumbar puncture  Significant Diagnostic Tests:  11/14 - CT abdomen/pelvis: obese, small LLL infiltrate, dilated gas filled loops of small and large bowel to rectum, no transition. No gastric distension, moderate stool burden in ascending colon. 12/10/2018 MRI brain cervical vertebrae with abnormal T2 signal restricted diffusion both medial thalami most likely diagnosis of Warnicke's encephalopathy  Micro Data:  SARS-Cov2 -negative Blood cultures -no growth to date Urine culture - negative  Antimicrobials:  Cefepime 11/14 - 11/18 Flagyl 11/14-11/18  Interim history/subjective:  Sleeping, no significant overnight events, did receive fluid bolus for tachycardia   Objective   Blood pressure 116/71, pulse (!) 130, temperature 98.2 F (36.8 C), temperature source Oral, resp. rate 20, height 5\' 11"  (1.803 m), weight (!) 145.7 kg, SpO2 96 %.  Intake/Output Summary (Last 24 hours) at 12/15/2018 0950 Last data filed at 12/15/2018 0600 Gross per 24 hour  Intake 2354.2 ml  Output 4375 ml  Net -2020.8 ml   Filed Weights   12/13/18 0500 12/14/18 0344 12/15/18 0404  Weight:  134.7 kg (!) 148.7 kg (!) 145.7 kg    Examination: General: Morbidly obese, tachycardic HEENT: Short neck. Neuro: Does not follow commands CV: S1-S2 appreciated PULM: Decreased air entry bilaterally GI: Bowel sounds appreciated  extremities: Warm/dry, 2+ edema Skin: no rashes or lesions   Chest x-ray: Hypoventilated lung fields  Resolved Hospital Problem list   Hypernatremia  Assessment & Plan:  Acute on chronic respiratory failure Obstructive sleep apnea history, not tolerant of positive pressure ventilation -Minimize sedation -Oxygen supplementation, titrate for saturations greater than 90  Possible aspiration pneumonia -Off antibiotics at present -Hypoventilated lung fields -Continue tube feeding -Aspiration precautions  Suspected sepsis Procalcitonin was 0.36, lactic acid of 7 at presentation -Afebrile in the last 48 hours -S/p lumbar puncture 11/20  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 -Continue high-dose thiamine -Continue antiseizure medication -Neurology following  50 pound weight loss, persistent bowel ileus -Tube feeds on hold -Scan results from soapsuds enema -Continue Dulcolax suppository -May require further intervention  Lumbar puncture CSF with elevated protein -Tap was traumatic -Neurology following  Ileus -We will add erythromycin for promotility -Resume trickle feeds   Daily Goals Checklist  Pain/Anxiety/Delirium protocol (if indicated): Lorazepam for seizures Respiratory support goals: Nasal cannula DVT prophylaxis: UFH tid Nutritional status and feeding goals: Continue trickle feeds, held secondary to vomiting GI prophylaxis: Protonix twice daily Fluid status goals: Lactated Ringer Central lines: LUE midline Glucose control: 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 Monitor when start feeding. Mobility/therapy needs: Bedrest Antibiotic de-escalation: Antibiotics discontinued 12/12/2018 Home medication  reconciliation: Restart Omfi once enteral access available Daily labs: BMP daily Code Status: Full code Family Communication: Discussed with mom at bedside Disposition: ICU  Labs   CBC: Recent Labs  Lab 12/19/2018 1425 12/09/18 0114 12/10/18 0250 12/11/18 0659  WBC 9.1 5.6 4.6 5.0  NEUTROABS 6.4 3.9 2.8 3.1  HGB 14.0 12.4* 10.9* 10.5*  HCT 42.3 37.3* 33.3* 32.0*  MCV 82.1 82.9 84.5 84.4  PLT 603* 358 331 306    Basic Metabolic Panel: Recent Labs  Lab 12/10/18 0250 12/11/18 0659 12/11/18 1719 12/11/18 1836 12/12/18 0532 12/12/18 1648 12/13/18 0633 12/15/18 0436  NA 137 151*  --  151*  --   --  138 138  K 4.0 3.7  --  3.3*  --   --  4.4 5.3*  CL 104 111  --  116*  --   --  98 100  CO2 16* 26  --  23  --   --  29 24  GLUCOSE 90 82  --  87  --   --  112* 79  BUN 10 17  --  17  --   --  5* <5*  CREATININE 0.71 0.59*  --  0.60*  --   --  0.47* 0.43*  CALCIUM 8.4* 8.4*  --  8.3*  --   --  8.5* 9.0  MG  --   --  1.8  --  1.8 1.5* 2.2 1.8  PHOS  --   --  2.2*  --  2.6 3.1 3.6 3.5   The patient is critically ill with multiple organ systems failure and requires high complexity decision making for assessment and support, frequent evaluation and titration of therapies, application of advanced  monitoring technologies and extensive interpretation of multiple databases. Critical Care Time devoted to patient care services described in this note independent of APP/resident time (if applicable)  is 30 minutes.   Sherrilyn Rist MD [facility] Personal pager: [phone] If unanswered, please page CCM On-call: [phone]  Still with limited bowel function, gastric suction with high output Will hold off on tube feeding Change erythro to IV for promotility

## 2018-12-15 NOTE — Progress Notes (Signed)
[place] Progress Note Patient Name: Aaron Vega DOB: 01-Aug-1995 MRN: [id number]   Date of Service  12/15/2018  HPI/Events of Note  Sinus Tachycardia - HR = 133. Afebrile. Last LVEF = 55-60%.   eICU Interventions  Will order: 1. Bolus with 0.9 NaCl 1 liter IV over 1 hour now.      Intervention Category Major Interventions: Arrhythmia - evaluation and management  Sommer,Steven Eugene 12/15/2018, 3:57 AM

## 2018-12-15 NOTE — Progress Notes (Addendum)
NEUROLOGY PROGRESS NOTE  Subjective:  Patient in bed, NAD. Patient seems to move right more than left. No family at bedside during examination, but I did talk to mom and updated her on the plan.   Exam: Vitals:   12/15/18 0800 12/15/18 0900  BP: (!) 140/95 130/86  Pulse: (!) 129 (!) 128  Resp: (!) 22 (!) 23  Temp:    SpO2: 99% 99%    Physical Exam  Constitutional: Appears well-developed and well-nourished.  Psych: Nonverbal and lethargic Eyes: No scleral injection HENT: No OP obstrucion Head: Normocephalic.  Cardiovascular: Tachycardic Respiratory: non- labored breathing,  GI: Soft.  distension.   Skin: WDI   Neuro:  Mental Status:  Patient is nonverbal. opens eyes spontaneously and to command today. Was able to shake his head no when I asked about a thumbs up. Not following other commands. Grimaces and open eyes to noxious. Moving BUE spontaneously. No spontaneous movements noted in BLE. Cranial Nerves: II: Blinks to threat III,IV, VI:  pupils equal round reactive.  When eyelids are open patient has vertical nystagmus again fast up beating still present. V,VII: Face equal and winces to pain Motor/Sensory: Patient grimaces to pain in all 4 extremities. No withdrawal noted. Moving BUE spontaneously. No spontaneous movements noted in BLE. Deep Tendon Reflexes: 2+ BUE with no knee jerk or ankle jerk Plantars: Mute bilaterally,   Medications:  Scheduled: . chlorhexidine  15 mL Mouth Rinse BID  . Chlorhexidine Gluconate Cloth  6 each Topical Q0600  . cloBAZam  60 mg Per Tube BID  . cyanocobalamin  1,000 mcg Subcutaneous Daily  . erythromycin  400 mg Per Tube Q8H  . feeding supplement (VITAL 1.5 CAL)  1,000 mL Per Tube Q24H  . heparin injection (subcutaneous)  5,000 Units Subcutaneous Q8H  . levothyroxine  25 mcg Per Tube Q0600  . lidocaine (PF)  5 mL Other Once  . mouth rinse  15 mL Mouth Rinse q12n4p  . nystatin   Topical Daily  . pantoprazole sodium  40 mg Per Tube BID   . polyethylene glycol  17 g Per Tube BID  . sennosides  5 mL Per Tube BID  . sodium chloride flush  10-40 mL Intracatheter Q12H  . sodium chloride flush  3 mL Intravenous Q12H  . [START ON 12/17/2018] thiamine  100 mg Per Tube Daily   Continuous: . sodium chloride Stopped (12/12/18 2140)  . lactated ringers 50 mL/hr at 12/15/18 0900  . levETIRAcetam 1,000 mg (12/15/18 1006)  . thiamine injection 250 mg (12/15/18 1030)    Pertinent Labs/Diagnostics:  Thiamine level pending  Unable to obtain thoracic and lumbar spine MRI secondary to patient unable to remain still. (holding MRI for now)  Laurey Morale, MSN, NP-C [facility] [phone]  Assessment: 23 year old autistic male with known epilepsy, nonverbal at baseline who presented with 6-week history of rapid decline in cognition and gait in addition to new onset peripheral neuropathic symptoms since the onset of intractable nausea/vomiting for approximately 6 weeks. He sustained an approximate 50 pound weight loss over that time. 1.  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 2.  MRI brain findings also consistent with Wernicke's encephalopathy 3.  Severe B12 deficiency with level of 159.  This most likely is contributing to or may be the sole etiology of bilateral  lower extremity weakness and areflexia seen on exam. 4. AIDP or CIDP would be unlikely given confirmed B12 deficiency and high likelihood of acute thiamine deficiency as the etiologies for the patient's lower extremity areflexia and weakness.  5.  MRI brain and cervical spine did not reveal any lesions to explain his bilateral lower extremity weakness and areflexia 6.  EEG did not show any epileptiform activity thus high likelihood this is not  subclinical seizures. 7. Unable to obtain MRI of thoracic and lumbar spine without anesthesia/intubation as patient unable to tolerate lying flat and could not hold still during yesterday's imaging attempt.  8. CT of lumbar and thoracic spine was negative for cord compression   Recommendations: -Patient afebrile for the past 24 hours, but previously has been febrile with no source of infection found -B12 injection 1000 mcg daily x7 days followed by weekly for 1 month prior to rechecking levels and assessing supplementation requirements -He has been started on high-dose thiamine 500 mg IV 3 times daily for 3 days  ( 1st dose 12/14/18) then decrease to 250 mg IV 3 times daily for 3 days and at that point continue supplemental 100 mg daily thereafter. -Continue Onfi via tube and Keppra 1000 mg IV twice daily -- LP completed under fluoro. Bloody tap with elevated protein. 0 WBC. Unclear if this represents artifactual protein elevation due to RBCs in bloody tap, versus true albuminocytologic dissociation. Overall it still could be AIDP. Benefits of empiric IVIG are felt to outweigh overall risks. Will start IVIG 0.4g/kg for 5 days.   Electronically signed: Dr. Caryl Pina

## 2018-12-15 NOTE — Progress Notes (Signed)
Pt had temp of 101 axillary while administering IVIG. Informed Dr. Fredrich Romans who stated to continue to run IVIG infusion.  Will continue to monitor closely.  Irven Baltimore, RN

## 2018-12-16 ENCOUNTER — Inpatient Hospital Stay (HOSPITAL_COMMUNITY)

## 2018-12-16 DIAGNOSIS — A419 Sepsis, unspecified organism: Secondary | ICD-10-CM | POA: Diagnosis not present

## 2018-12-16 DIAGNOSIS — E512 Wernicke's encephalopathy: Secondary | ICD-10-CM | POA: Diagnosis not present

## 2018-12-16 DIAGNOSIS — R6520 Severe sepsis without septic shock: Secondary | ICD-10-CM | POA: Diagnosis not present

## 2018-12-16 DIAGNOSIS — E43 Unspecified severe protein-calorie malnutrition: Secondary | ICD-10-CM | POA: Insufficient documentation

## 2018-12-16 LAB — GLUCOSE, CAPILLARY
Glucose-Capillary: 75 mg/dL (ref 70–99)
Glucose-Capillary: 79 mg/dL (ref 70–99)
Glucose-Capillary: 78 mg/dL (ref 70–99)
Glucose-Capillary: 74 mg/dL (ref 70–99)
Glucose-Capillary: 78 mg/dL (ref 70–99)
Glucose-Capillary: 76 mg/dL (ref 70–99)
Glucose-Capillary: 72 mg/dL (ref 70–99)

## 2018-12-16 IMAGING — DX DG ABDOMEN 1V
2 series · 2 of 2 positions shown · non-contrast
Comparison: None.

CLINICAL DATA: Ileus.

EXAM:
ABDOMEN - 1 VIEW

[abdomen kub (1 of 2)]
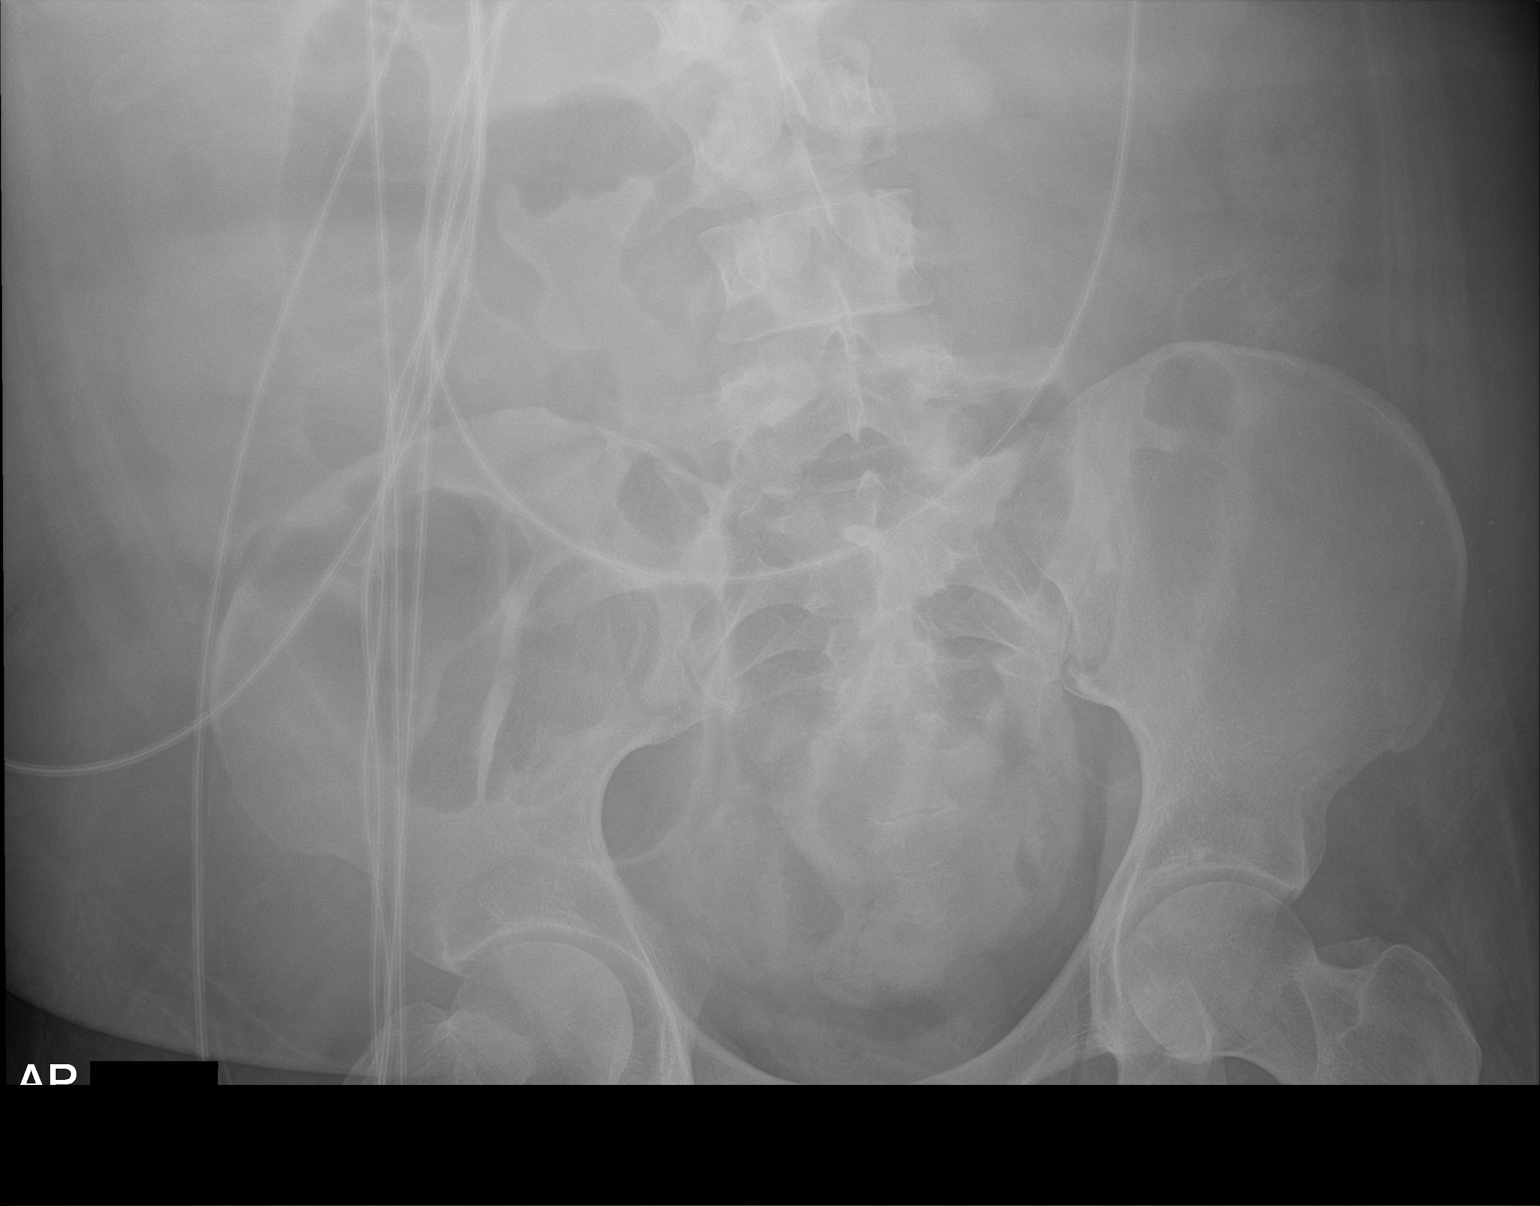

[abdomen kub (2 of 2)]
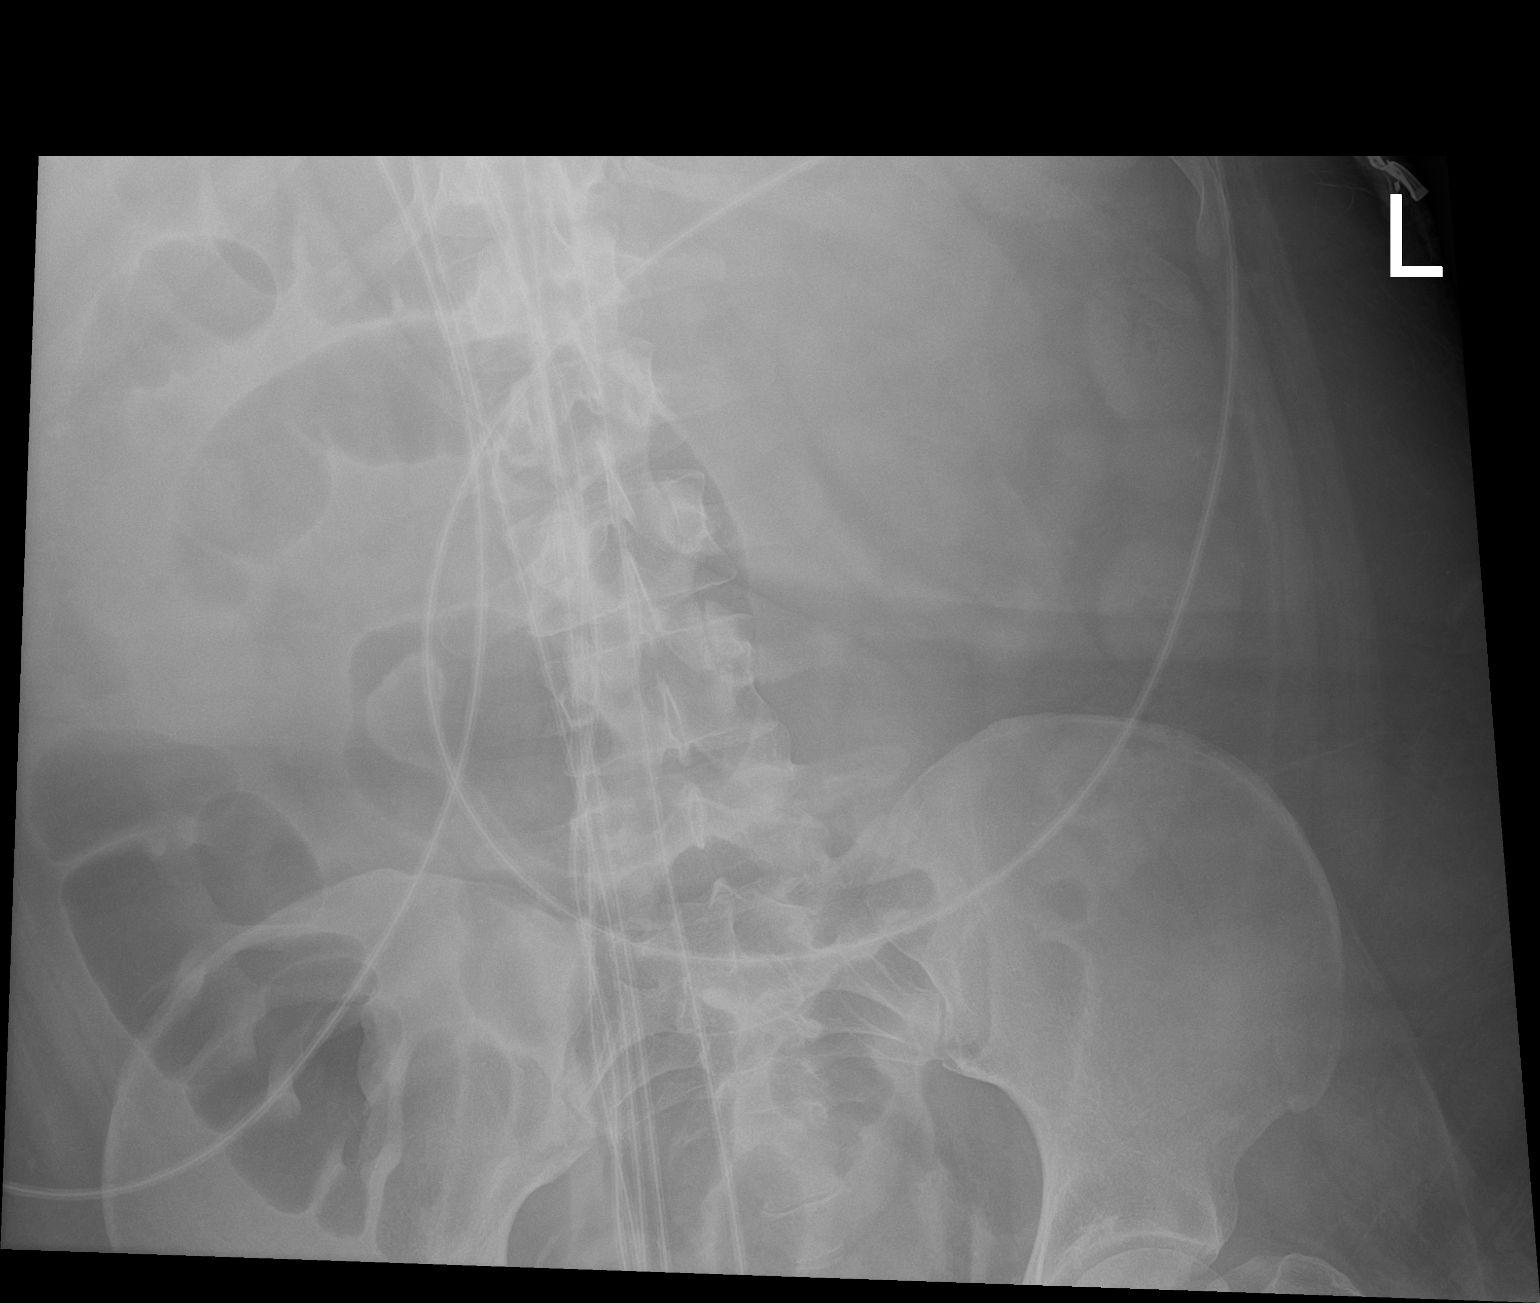

[2 of 2 positions shown; findings below may reference images not displayed]

FINDINGS: There is a dilated loop of small bowel in the mid abdomen measuring
approximately 3.6 cm. There is no obvious pneumatosis or free air.
The patient's enteric tube is outside the field of view.
IMPRESSION: Mildly dilated loop of small bowel in the mid abdomen is nonspecific
and could represent an ileus or partial small bowel obstruction the
appropriate clinical setting.

## 2018-12-16 IMAGING — DX DG CHEST 1V PORT
1 series · 1 of 1 positions shown · non-contrast
Comparison: [DATE].

CLINICAL DATA: Shortness of breath.

EXAM:
PORTABLE CHEST 1 VIEW

[chest ap]
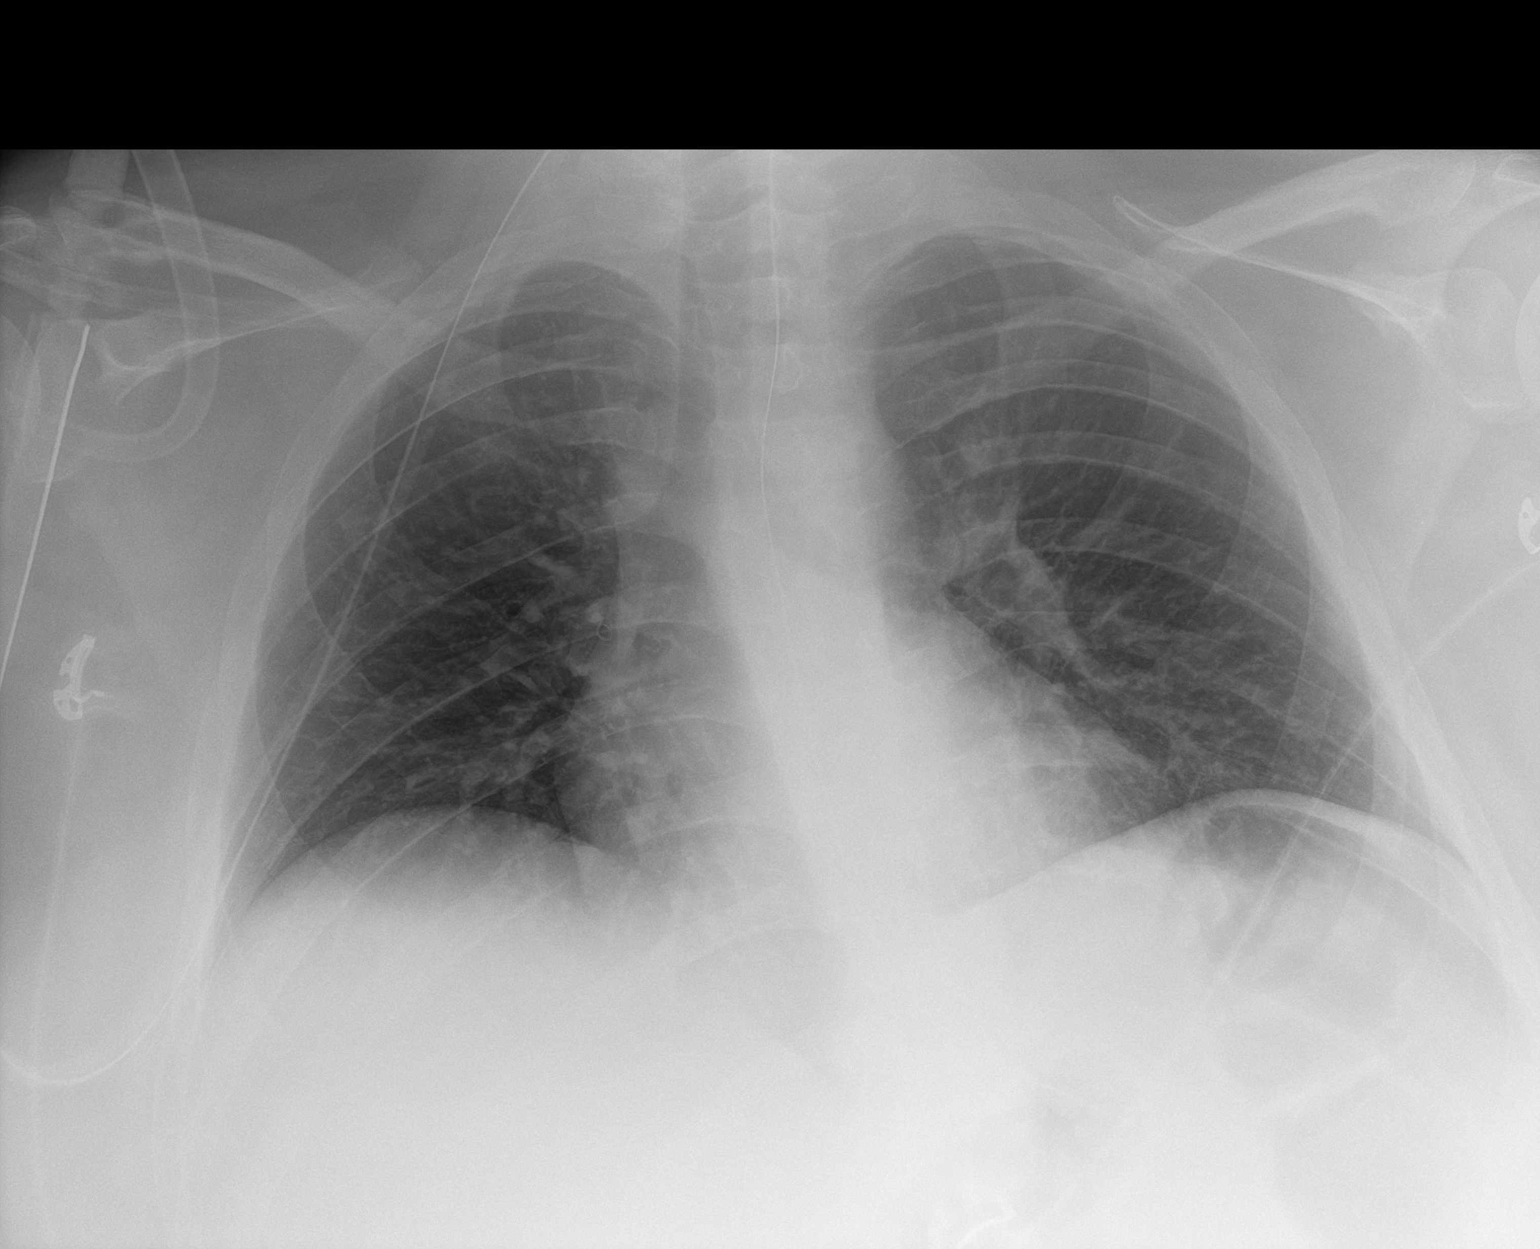

[1 of 1 positions shown; findings below may reference images not displayed]

FINDINGS: Again noted are low lung volumes with bibasilar atelectasis. The
cardiac silhouette remains enlarged. There is no pneumothorax. No
large pleural effusion. No acute osseous abnormality. An enteric
tube is noted.
IMPRESSION: Stable appearance of the chest.

## 2018-12-16 MED ORDER — ACETAMINOPHEN 160 MG/5ML PO SOLN
650.0000 mg | Freq: Four times a day (QID) | ORAL | Status: DC | PRN
  Administered 2018-12-16 – 2018-12-20 (×8): 650 mg via ORAL
  Filled 2018-12-16 (×8): qty 20.3

## 2018-12-16 MED ORDER — DEXTROSE IN LACTATED RINGERS 5 % IV SOLN
INTRAVENOUS | Status: DC
  Administered 2018-12-16: 23:00:00 via INTRAVENOUS
  Administered 2018-12-19: 1000 mL via INTRAVENOUS
  Administered 2018-12-21 – 2018-12-26 (×9): via INTRAVENOUS
  Filled 2018-12-16: qty 1000

## 2018-12-16 NOTE — Progress Notes (Addendum)
NEUROLOGY PROGRESS NOTE  Subjective:  Patient in bed more awake and alert today. Following some simple commands. Mother at bedside and updated. NGT for suctioning in place. Received first dose of IVIG yesterday, patient became febrile again t max 38.1   Exam: Vitals:   12/16/18 0800 12/16/18 0903  BP: 127/68 127/68  Pulse: (!) 129 (!) 122  Resp: (!) 31 18  Temp: 99.6 F (37.6 C) 98.8 F (37.1 C)  SpO2: 90%     Physical Exam  Constitutional: Appears well-developed and well-nourished.  Psych: Nonverbal and awake Eyes: No scleral injection HENT: No OP obstrucion Head: Normocephalic.  Cardiovascular: Tachycardic Respiratory: non- labored breathing, on 2 L [place]  GI: Soft.  distension.   Skin: WDI   Neuro:  Mental Status:  Patient is nonverbal. opens eyes spontaneously and to command today. Was able to shake his head no when I asked about a thumbs up. Able to life BUE anti gravity to command and spontaneously. Grimaces and open eyes to noxious.  No spontaneous movements noted in BLE. Saw a flicker where he tried to move leg on demand.  Cranial Nerves: II: Blinks to threat III,IV, VI:  pupils equal round reactive.  When eyelids are open patient has fast up-beating vertical nystagmus, still present since earlier this week. V,VII: Face equal and winces to pain Motor/Sensory: Patient grimaces to pain in all 4 extremities. No withdrawal noted, but moving BUE spontaneously. No spontaneous movements noted in BLE, but saw a flicker where he tried to move leg on demand, which is an improvement.  Deep Tendon Reflexes: 2+ BUE with no knee or ankle jerks Plantars: Mute bilaterally   Medications:  Scheduled: . chlorhexidine  15 mL Mouth Rinse BID  . Chlorhexidine Gluconate Cloth  6 each Topical Q0600  . cloBAZam  60 mg Per Tube BID  . cyanocobalamin  1,000 mcg Subcutaneous Daily  . heparin injection (subcutaneous)  5,000 Units Subcutaneous Q8H  . levothyroxine  25 mcg Per Tube Q0600  .  lidocaine (PF)  5 mL Other Once  . mouth rinse  15 mL Mouth Rinse q12n4p  . nystatin   Topical Daily  . pantoprazole sodium  40 mg Per Tube BID  . polyethylene glycol  17 g Per Tube BID  . sennosides  5 mL Per Tube BID  . sodium chloride flush  10-40 mL Intracatheter Q12H  . sodium chloride flush  3 mL Intravenous Q12H  . [START ON 12/17/2018] thiamine  100 mg Per Tube Daily   Continuous: . sodium chloride Stopped (12/12/18 2140)  . erythromycin 500 mg (12/16/18 0624)  . Immune Globulin 10% Stopped (12/15/18 1755)  . lactated ringers 50 mL/hr at 12/16/18 0400  . levETIRAcetam Stopped (12/15/18 2254)  . thiamine injection Stopped (12/15/18 2333)    Pertinent Labs/Diagnostics:  Thiamine level pending  CT of L-spine and T- spine was negative for cord compression.   Unable to obtain thoracic and lumbar spine MRI secondary to patient unable to remain still. (holding MRI for now)  Laurey Morale, MSN, NP-C [facility] [phone]  Assessment: 23 year old autistic male with known epilepsy, nonverbal at baseline who presented with 6-week history of rapid decline in cognition and gait in addition to new onset peripheral neuropathic symptoms since the onset of intractable nausea/vomiting for approximately 6 weeks. He sustained an approximate 50 pound weight loss over that time. MRI revealed strikingly symmetric signal abnormalities that appear most consistent with Wernicke's encephalopathy. His exam findings were also consistent with this.  1.  Exam findings of vertical nystagmus and altered mental status in conjunction with history of ataxic gait worsening over 6 weeks in the setting of nausea vomiting and decreased oral intake are most consistent with thiamine deficiency resulting in Wernicke's encephalopathy. 2.  MRI brain findings also consistent with Wernicke's encephalopathy 3.  Severe B12 deficiency with level of 159.  This most likely is contributing to or may be the  sole etiology of bilateral lower extremity weakness and areflexia seen on exam. 4. AIDP or CIDP would be unlikely given confirmed B12 deficiency and high likelihood of acute thiamine deficiency as the etiologies for the patient's lower extremity areflexia and weakness.  5.  MRI brain and cervical spine did not reveal any lesions to explain his bilateral lower extremity weakness and areflexia 6.  EEG did not show any epileptiform activity thus high likelihood this is not subclinical seizures. 7. Unable to obtain MRI of thoracic and lumbar spine without anesthesia/intubation as patient unable to tolerate lying flat and could not hold still during yesterday's imaging attempt.  8. CT of lumbar and thoracic spine was negative for cord compression   Recommendations: -Patient again febrile in the past 24 hours, but previously has been afebrile for 24 hours, with no source of infection found. Tmax 38.1 -B12 injection 1000 mcg daily x 7 days followed by weekly for 1 month prior to rechecking levels and re-assessing supplementation requirements -He has been started on high-dose thiamine 500 mg IV 3 times daily for 3 days  ( 1st dose 12/14/18) then decrease to 250 mg IV 3 times daily for 3 days and at that point continue supplemental 100 mg daily thereafter. -Continue Onfi via tube and Keppra 1000 mg IV twice daily --Continue IVIG for a total 5 days (first dose 12/15/2018) --May need outpatient EMG/NCS of lower extremities and lumbar paraspinal muscles after discharge. Unable to perform in the hospital.   Electronically signed: Dr. Caryl Pina

## 2018-12-16 NOTE — Progress Notes (Signed)
NAME:  Aaron Vega, MRN:  [id number], DOB:  1995/06/11, LOS: 8 ADMISSION DATE:  2018/12/09, CONSULTATION DATE:  12/10/2018 REFERRING MD:  Lajuana Ripple - TRH, CHIEF COMPLAINT:  Rising lactate level.  Brief History   23 year old autistic man with abdominal distension and vomiting.  History of present illness   Known for poor and declining health status from severe autism with seizures, severe constipation recently.  His QoL was significantly better prior to 08/2018 when he suffered a severe seizure at home and struck his head. Seen in ED only but has has worsening mental status since. Per chart, this was ascribed to  hypoxic ischemic encephalopathy following prolonged seizure.   Presented via ED for increasing lethargy and vomiting and decreased urine output.   Significant disconnect regarding goals of care as currently receiving hospice care at home but still full code and mother not yet willing to place limits on care.  Was unable to have MRI performed 12/12/2018 secondary to agitation and risk of respiratory depression with sedation  Post lumbar puncture 12/14/2018  Past Medical History   Past Medical History:  Diagnosis Date  . Autistic disorder, current or active state   . Fatty liver   . Hypertension   . Obesity   . Pancreatitis    at age 27 years  . Pneumonia   . Pre-diabetes   . Seizures (HCC)    as of 11/23/15 - no seizures for 3 month  . Sleep apnea    not on cpap (can't tolerate)  . Tics of organic origin   . Tourette syndrome    Past Surgical History:  Procedure Laterality Date  . BIOPSY  11/08/2018   Procedure: BIOPSY;  Surgeon: Beverley Fiedler, MD;  Location: [facility];  Service: Gastroenterology;;  . ESOPHAGOGASTRODUODENOSCOPY (EGD) WITH PROPOFOL N/A 11/08/2018   Procedure: ESOPHAGOGASTRODUODENOSCOPY (EGD) WITH PROPOFOL;  Surgeon: Beverley Fiedler, MD;  Location: [facility];  Service: Gastroenterology;  Laterality: N/A;  . FLEXIBLE SIGMOIDOSCOPY N/A 11/21/2018    Procedure: FLEXIBLE SIGMOIDOSCOPY;  Surgeon: Lemar Lofty., MD;  Location: [facility];  Service: Gastroenterology;  Laterality: N/A;  . IMPACTION REMOVAL  11/21/2018   Procedure: IMPACTION REMOVAL;  Surgeon: Lemar Lofty., MD;  Location: [facility];  Service: Gastroenterology;;  . RADIOLOGY WITH ANESTHESIA N/A 11/24/2015   Procedure: CT SCAN ABDOMEN AND PELVIS WITH CONTRAST;  Surgeon: Medication Radiologist, MD;  Location: [facility] OR;  Service: Radiology;  Laterality: N/A;  . TONSILLECTOMY     and adenoidectomy    Significant Hospital Events   11/14 admission to [facility]. 11/15 increasing lactate despite fluids. 12/10/2018 transfer to intensive care unit 12/15/2018 transferred to medical floor  Consults:  PCCM 11/16. Urology 12/10/2018 Neurology  Procedures:  11/09/2018 NG tube placed per interventional radiology 12/14/2018 lumbar puncture  Significant Diagnostic Tests:  11/14 - CT abdomen/pelvis: obese, small LLL infiltrate, dilated gas filled loops of small and large bowel to rectum, no transition. No gastric distension, moderate stool burden in ascending colon. 12/10/2018 MRI brain cervical vertebrae with abnormal T2 signal restricted diffusion both medial thalami most likely diagnosis of Warnicke's encephalopathy  Micro Data:  SARS-Cov2 -negative Blood cultures -no growth to date Urine culture - negative  Antimicrobials:  Cefepime 11/14 - 11/18 Flagyl 11/14-11/18  Interim history/subjective:  Sleeping, no significant overnight events More aware this morning  Objective   Blood pressure 127/68, pulse (!) 122, temperature 98.8 F (37.1 C), temperature source Axillary, resp. rate 18, height 5\' 11"  (1.803 m), weight (!) 145.7 kg,  SpO2 90 %.        Intake/Output Summary (Last 24 hours) at 12/16/2018 1047 Last data filed at 12/16/2018 0555 Gross per 24 hour  Intake 1653.93 ml  Output 5250 ml  Net -3596.07 ml   Filed Weights   12/13/18 0500 12/14/18  0344 12/15/18 0404  Weight: 134.7 kg (!) 148.7 kg (!) 145.7 kg    Examination: General: Morbidly obese, tachycardic HEENT: Short neck. Neuro: Not following commands CV: S1-S2 appreciated PULM: Decreased air entry bilaterally GI: Bowel sounds appreciated extremities: Warm/dry, 2+ edema Skin: no rashes or lesions   Chest x-ray significant for hypoventilated lung fields  Resolved Hospital Problem list   Hypernatremia  Assessment & Plan:  Acute on chronic respiratory failure Obstructive sleep apnea history, not tolerant of positive pressure ventilation -Minimize sedation -Oxygen supplementation  Possible aspiration pneumonia -Completed antibiotics -Hypoventilated lung fields -Aspiration precautions  Suspected sepsis -Fever trend was downwards  Encephalopathy 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 erythromycin  Lumbar puncture CSF with elevated protein -Tap was traumatic  Ileus -We will add erythromycin for promotility -Resume trickle feeds   Daily Goals Checklist  Pain/Anxiety/Delirium protocol (if indicated): Lorazepam for seizures Respiratory support goals: Nasal cannula DVT prophylaxis: Heparin 3 times daily Nutritional status and feeding goals: N.p.o. 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 Monitor when start feeding. Mobility/therapy needs: Bedrest Antibiotic de-escalation: Antibiotics discontinued 12/12/2018 Home medication reconciliation: Restart Omfi once enteral access available Daily labs: BMP daily Code Status: Full code  Family Communication: Discussed with mom at bedside 11/22 Disposition: Stepdown  Labs   CBC: Recent Labs  Lab 12/10/18 0250 12/11/18 0659  WBC 4.6 5.0  NEUTROABS 2.8 3.1  HGB 10.9* 10.5*  HCT 33.3* 32.0*  MCV 84.5 84.4  PLT 331 562    Basic Metabolic Panel: Recent Labs  Lab 12/10/18 0250 12/11/18 0659 12/11/18 1719 12/11/18 1836 12/12/18 0532 12/12/18 1648 12/13/18 0633 12/15/18 0436  NA 137 151*  --  151*  --   --  138 138  K 4.0 3.7  --  3.3*  --   --  4.4 5.3*  CL 104 111  --  116*  --   --  98 100  CO2 16* 26  --  23  --   --  29 24  GLUCOSE 90 82  --  87  --   --  112* 79  BUN 10 17  --  17  --   --  5* <5*  CREATININE 0.71 0.59*  --  0.60*  --   --  0.47* 0.43*  CALCIUM 8.4* 8.4*  --  8.3*  --   --  8.5* 9.0  MG  --   --  1.8  --  1.8 1.5* 2.2 1.8  PHOS  --   --  2.2*  --  2.6 3.1 3.6 3.5   Sherrilyn Rist, MD [place], PCCM

## 2018-12-16 NOTE — Progress Notes (Signed)
Called to bedside  Discussed with mom about suctioning We will order chest x-ray, ordered abdominal x-ray  Reassured mom that with doing the best we can  We will check a palpation again   Did call hospitalist to transfer service, to be picked up in a.m.

## 2018-12-16 NOTE — Progress Notes (Signed)
All attempts to pacify mom about managing the patient on the medical floor proved abortive  She feels if we are not suctioning regularly and deeply as was been done in the ICU, that her son will choke to death  I did try to explain that suctioning is not an indication for somebody to be in the ICU that we will make sure that we suctioning frequently and as best as we can on the medical floor,obtain repeat CXR   All my efforts seem to make her more upset  We had a discussion about prognosis and I did mention that because he has issues that we have not been able to resolve that he is becoming progressively weaker and he may have difficulty with clearing secretions I did auscultate him several times noting clear lung fields  our discussion did deteriorate as she kept asking me to predict when Linton will die and I did say I do not know, I am not trying to predict when Raylen will die but that we continue to have problems that we have not been able to fix We did talk about the fact that he may end up on a ventilator if he cannot clear secretions and subsequently require a tracheostomy.  Will request an ICU bed

## 2018-12-16 NOTE — Progress Notes (Signed)
Report called to 1M, patient going to [id number]

## 2018-12-16 NOTE — Progress Notes (Signed)
eLink Physician-Brief Progress Note Patient Name: Aaron Vega DOB: 01/25/1995 MRN: [id number]   Date of Service  12/16/2018  HPI/Events of Note  Glucose in 70s. Not on tube feeds anymore  eICU Interventions  Is on LR Will change to D5 LR at same rate      Intervention Category Minor Interventions: Routine modifications to care plan (e.g. PRN medications for pain, fever)  Margaretmary Lombard 12/16/2018, 10:57 PM

## 2018-12-17 DIAGNOSIS — G629 Polyneuropathy, unspecified: Secondary | ICD-10-CM

## 2018-12-17 DIAGNOSIS — A419 Sepsis, unspecified organism: Secondary | ICD-10-CM | POA: Diagnosis not present

## 2018-12-17 DIAGNOSIS — K567 Ileus, unspecified: Secondary | ICD-10-CM | POA: Diagnosis not present

## 2018-12-17 DIAGNOSIS — R112 Nausea with vomiting, unspecified: Secondary | ICD-10-CM | POA: Diagnosis not present

## 2018-12-17 LAB — IGG CSF INDEX
Albumin CSF-mCnc: 109 mg/dL — ABNORMAL HIGH (ref 11–48)
Albumin: 3.3 g/dL — ABNORMAL LOW (ref 4.1–5.2)
CSF IgG Index: 0.9 — ABNORMAL HIGH (ref 0.0–0.7)
IgG (Immunoglobin G), Serum: 599 mg/dL — ABNORMAL LOW (ref 603–1613)
IgG, CSF: 17.7 mg/dL — ABNORMAL HIGH (ref 0.0–8.6)
IgG/Alb Ratio, CSF: 0.16 (ref 0.00–0.25)

## 2018-12-17 LAB — GLUCOSE, CAPILLARY
Glucose-Capillary: 80 mg/dL (ref 70–99)
Glucose-Capillary: 81 mg/dL (ref 70–99)
Glucose-Capillary: 83 mg/dL (ref 70–99)
Glucose-Capillary: 84 mg/dL (ref 70–99)
Glucose-Capillary: 84 mg/dL (ref 70–99)
Glucose-Capillary: 86 mg/dL (ref 70–99)
Glucose-Capillary: 89 mg/dL (ref 70–99)

## 2018-12-17 LAB — HSV DNA BY PCR (REFERENCE LAB)
HSV 1 DNA: NEGATIVE
HSV 2 DNA: NEGATIVE

## 2018-12-17 LAB — VARICELLA-ZOSTER BY PCR: Varicella-Zoster, PCR: NEGATIVE

## 2018-12-17 MED ORDER — VITAL 1.5 CAL PO LIQD
1000.0000 mL | ORAL | Status: DC
  Administered 2018-12-17: 1000 mL
  Filled 2018-12-17 (×2): qty 1000

## 2018-12-17 MED ORDER — CYANOCOBALAMIN 1000 MCG/ML IJ SOLN
1000.0000 ug | Freq: Every day | INTRAMUSCULAR | Status: AC
  Administered 2018-12-17 – 2018-12-19 (×3): 1000 ug via INTRAMUSCULAR
  Filled 2018-12-17 (×3): qty 1

## 2018-12-17 MED ORDER — ADULT MULTIVITAMIN LIQUID CH
15.0000 mL | Freq: Every day | ORAL | Status: DC
  Administered 2018-12-17 – 2019-01-11 (×24): 15 mL
  Filled 2018-12-17 (×26): qty 15

## 2018-12-17 MED ORDER — METOPROLOL TARTRATE 5 MG/5ML IV SOLN
2.5000 mg | Freq: Four times a day (QID) | INTRAVENOUS | Status: DC
  Administered 2018-12-17: 2.5 mg via INTRAVENOUS
  Filled 2018-12-17: qty 5

## 2018-12-17 MED ORDER — METOPROLOL TARTRATE 5 MG/5ML IV SOLN
5.0000 mg | Freq: Four times a day (QID) | INTRAVENOUS | Status: DC
  Administered 2018-12-17 – 2018-12-18 (×3): 5 mg via INTRAVENOUS
  Filled 2018-12-17 (×3): qty 5

## 2018-12-17 MED ORDER — METOPROLOL TARTRATE 5 MG/5ML IV SOLN
2.5000 mg | Freq: Once | INTRAVENOUS | Status: AC
  Administered 2018-12-17: 2.5 mg via INTRAVENOUS
  Filled 2018-12-17: qty 5

## 2018-12-17 NOTE — Progress Notes (Signed)
 Nutrition Follow-up  DOCUMENTATION CODES:   Severe malnutrition in context of acute illness/injury  INTERVENTION:   If unable to tolerate TF, recommend initiation of TPN as pt with severe malnutrition on admission, minimal nutrition since admission with 2 months of vomiting PTA. Consider placement of Cortrak post-pyloric feeding tube as well   Tube Feeding:  Trial of Trickle TF of Vital 1.5 at 20 ml/hr Goal rate: Vital 1.5 at 60 ml/hr with Pro-Stat 30 mL BID Provides 122 g of protein, 2360 kcals, 1094 mL of free water Goal rate meets 100% of estimated calorie and protein needs  Zinc level pending; recommend supplementing if deficient   NUTRITION DIAGNOSIS:   Severe Malnutrition related to acute illness as evidenced by energy intake < or equal to 50% for > or equal to 5 days, percent weight loss.  Continues but being addressed via nutrition support  GOAL:   Patient will meet greater than or equal to 90% of their needs  Not met  MONITOR:   TF tolerance, Diet advancement, Labs, Weight trends  REASON FOR ASSESSMENT:   Consult Enteral/tube feeding initiation and management, Assessment of nutrition requirement/status  ASSESSMENT:   23 yo male with autism and epilepsy with 6 week hx of rapid decline in cognition and gait in addition to new peripheral neuropathic symptoms since the onset of intractable N/V for 6 weeks. Per neurology, findings most consistent with acute thiamine deficiency, pt with B12 deficiency as well. Pt is nonverbal at baseline. Additional PMH includes fatty liver, HTN, pancreatitis, OSA  NPO. TF not infusing, orders discontinued. Pt has not had any significant nutrition since admission, NPO and not tolerating TF.   Pt finally had a large BM, NG output minimal  Micronutrients:  Vitamin B-12: 159 (L) supplementing Thiamine: pending (supplementing) Copper: 122 (wdl) Zinc: pending  Labs: CBGs 72-84, potassium 5.3 on 11/21 Meds: D5-LR at 50 ml/hr,  erythromycin, B-12, thiamine, liquid MVI   Diet Order:   Diet Order            Diet NPO time specified  Diet effective now              EDUCATION NEEDS:   Not appropriate for education at this time  Skin:  Skin Assessment: Reviewed RN Assessment  Last BM:  11/20- small mucousy result post enema, otherwise no recent BM  Height:   Ht Readings from Last 1 Encounters:  12/10/2018 '5\' 11"'  (1.803 m)    Weight:   Wt Readings from Last 1 Encounters:  12/17/18 (!) 136.3 kg    Ideal Body Weight:  78.1 kg  BMI:  Body mass index is 41.91 kg/m.  Estimated Nutritional Needs:   Kcal:  2200-2500 kcals  Protein:  110-135 g  Fluid:  >/= 2 L     Doyle Kunath MS, RDN, LDN, CNSC [phone] Pager  [phone] Weekend/On-Call Pager

## 2018-12-17 NOTE — Progress Notes (Signed)
[facility]  PROGRESS NOTE    Aaron Vega  IRJ:[id number] DOB: [birth year] DOA: 01/05/2019 PCP: [facility]   Brief Narrative:   Known for poor and declining health status from severe autism with seizures, severe constipation recently.  His QoL was significantly better prior to 08/2018 when he suffered a severe seizure at home and struck his head. Seen in ED only but has has worsening mental status since. Per chart, this was ascribed to  hypoxic ischemic encephalopathy following prolonged seizure.  Presented via ED for increasing lethargy and vomiting and decreased urine output.  Significant disconnect regarding goals of care as currently receiving hospice care at home but still full code and mother not yet willing to place limits on care. Was unable to have MRI performed 12/12/2018 secondary to agitation and risk of respiratory depression with sedation  11/23: BM noted ON. Otherwise, remains tachy.   Assessment & Plan:   Principal Problem:   Severe sepsis ([place]) Active Problems:   Autistic disorder   Intractable nausea and vomiting   Seizure disorder (HCC)   Constipation in male   Obesity, Class III, BMI 40-49.9 (morbid obesity) ([place])   Ileus (HCC)   Aspiration pneumonia ([place])   Community acquired pneumonia of left lower lobe of lung   Goals of care, counseling/discussion   Palliative care by specialist   DNR (do not resuscitate) discussion   Protein-calorie malnutrition, severe  Acute on chronic respiratory failure Obstructive sleep apnea history, not tolerant of positive pressure ventilation Possible aspiration pneumonia Sepsis secondary to asp PNA     - currently on 3L [place], sats mid to high 90s     - s/p cefepime, flagyl x 3 days     - continue aspiration precautions  Encephalopathy, cause unclear likely multifactorial 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 Seizure d/o     - neurology onboard, appreciate assistance     - continue  thiamine     - continue keppra, onfi     - s/p LP  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 Barre     - IVIG, end date 11/25  Ileus 50 pound weight loss     - on erythromycin     - trickle feeds attempted, but not successful     - he has had a large BM this AM; and there is minimal in the NGT container, can attempt trickle feeds again  HTN Tachycardia     - had metoprolol PRN; will schedule q6h and increase to 5mg      - if he tolerates the trick feeds, can consider transition to PO  DVT prophylaxis: heparin Code Status: FULL Family Communication: With mother at bedside   Disposition Plan: TBD  Consultants:   PCCM  Antimicrobials:  . erythromycin   Subjective: BM this AM  Objective: Vitals:   12/17/18 0630 12/17/18 0634 12/17/18 0700 12/17/18 0800  BP: (!) 151/112 (!) 138/96 (!) 150/105 136/88  Pulse: (!) 130 (!) 127 (!) 130 (!) 124  Resp: (!) 21 (!) 21 (!) 26 (!) 24  Temp:    98.6 F (37 C)  TempSrc:    Oral  SpO2: 97% 99% 94% 93%  Weight:      Height:        Intake/Output Summary (Last 24 hours) at 12/17/2018 0858 Last data filed at 12/17/2018 0800 Gross per 24 hour  Intake 2299.53 ml  Output 1000 ml  Net 1299.53 ml   Filed Weights   12/14/18 0344 12/15/18 0404 12/17/18 0500  Weight: (!) 148.7  kg (!) 145.7 kg (!) 136.3 kg    Examination:  General: 23 y.o. male resting in bed in NAD Cardiovascular: tachy, +S1, S2, no m/g/r, equal pulses throughout Respiratory: clear, sonorous, normal WOB GI: BS hypoactive, obese, NT, no masses noted, no organomegaly noted MSK: No e/c/c Neuro: opens eyes and track, but not following commands   Data Reviewed: I have personally reviewed following labs and imaging studies.  CBC: Recent Labs  Lab 12/11/18 0659  WBC 5.0  NEUTROABS 3.1  HGB 10.5*  HCT 32.0*  MCV 84.4  PLT 306   Basic Metabolic Panel: Recent Labs  Lab 12/11/18 0659 12/11/18 1719 12/11/18 1836 12/12/18 0532 12/12/18 1648 12/13/18 0633 12/15/18 0436  NA  151*  --  151*  --   --  138 138  K 3.7  --  3.3*  --   --  4.4 5.3*  CL 111  --  116*  --   --  98 100  CO2 26  --  23  --   --  29 24  GLUCOSE 82  --  87  --   --  112* 79  BUN 17  --  17  --   --  5* <5*  CREATININE 0.59*  --  0.60*  --   --  0.47* 0.43*  CALCIUM 8.4*  --  8.3*  --   --  8.5* 9.0  MG  --  1.8  --  1.8 1.5* 2.2 1.8  PHOS  --  2.2*  --  2.6 3.1 3.6 3.5   GFR: Estimated Creatinine Clearance: 206 mL/min (A) (by C-G formula based on SCr of 0.43 mg/dL (L)). Liver Function Tests: Recent Labs  Lab 12/11/18 0659  AST 84*  ALT 121*  ALKPHOS 32*  BILITOT 1.2  PROT 5.0*  ALBUMIN 2.6*   No results for input(s): LIPASE, AMYLASE in the last 168 hours. No results for input(s): AMMONIA in the last 168 hours. Coagulation Profile: Recent Labs  Lab 12/12/18 0532 12/13/18 1530 12/14/18 0841  INR 3.6* 1.1 1.0   Cardiac Enzymes: No results for input(s): CKTOTAL, CKMB, CKMBINDEX, TROPONINI in the last 168 hours. BNP (last 3 results) No results for input(s): PROBNP in the last 8760 hours. HbA1C: No results for input(s): HGBA1C in the last 72 hours. CBG: Recent Labs  Lab 12/16/18 2011 12/16/18 2221 12/17/18 0008 12/17/18 0342 12/17/18 0744  GLUCAP 76 72 81 84 80   Lipid Profile: No results for input(s): CHOL, HDL, LDLCALC, TRIG, CHOLHDL, LDLDIRECT in the last 72 hours. Thyroid Function Tests: No results for input(s): TSH, T4TOTAL, FREET4, T3FREE, THYROIDAB in the last 72 hours. Anemia Panel: No results for input(s): VITAMINB12, FOLATE, FERRITIN, TIBC, IRON, RETICCTPCT in the last 72 hours. Sepsis Labs: Recent Labs  Lab 12/10/18 1107 12/10/18 1702 12/11/18 1029  LATICACIDVEN 7.2* 5.3* 1.6    Recent Results (from the past 240 hour(s))  Urine culture     Status: None   Collection Time: 12/21/2018  2:11 PM   Specimen: In/Out Cath Urine  Result Value Ref Range Status   Specimen Description IN/OUT CATH URINE  Final   Special Requests NONE  Final   Culture    Final    NO GROWTH Performed at [facility], [address] [place], [place] [address]    Report Status 12/09/2018 FINAL  Final  Blood Culture (routine x 2)     Status: None   Collection Time: 12/20/2018  3:00 PM   Specimen: BLOOD  RIGHT HAND  Result Value Ref Range Status   Specimen Description BLOOD RIGHT HAND  Final   Special Requests   Final    BOTTLES DRAWN AEROBIC ONLY Blood Culture results may not be optimal due to an inadequate volume of blood received in culture bottles   Culture   Final    NO GROWTH 5 DAYS Performed at [facility], [address] [place], [place] [address]    Report Status 12/13/2018 FINAL  Final  Blood Culture (routine x 2)     Status: None   Collection Time: 01-09-2019  3:08 PM   Specimen: BLOOD  Result Value Ref Range Status   Specimen Description BLOOD RIGHT ANTECUBITAL  Final   Special Requests   Final    BOTTLES DRAWN AEROBIC AND ANAEROBIC Blood Culture results may not be optimal due to an inadequate volume of blood received in culture bottles   Culture   Final    NO GROWTH 5 DAYS Performed at [facility], [address] [place], [place] [address]    Report Status 12/13/2018 FINAL  Final  SARS CORONAVIRUS 2 (TAT 6-24 HRS) Nasopharyngeal Nasopharyngeal Swab     Status: None   Collection Time: 01-09-2019  4:06 PM   Specimen: Nasopharyngeal Swab  Result Value Ref Range Status   SARS Coronavirus 2 NEGATIVE NEGATIVE Final    Comment: (NOTE) SARS-CoV-2 target nucleic acids are NOT DETECTED. The SARS-CoV-2 RNA is generally detectable in upper and lower respiratory specimens during the acute phase of infection. Negative results do not preclude SARS-CoV-2 infection, do not rule out co-infections with other pathogens, and should not be used as the sole basis for treatment or other patient management decisions. Negative results must be combined with clinical observations, patient history, and epidemiological information. The expected  result is Negative. Fact Sheet for Patients: HairSlick.nohttps://www.fda.gov/media/138098/download Fact Sheet for Healthcare Providers: quierodirigir.comhttps://www.fda.gov/media/138095/download This test is not yet approved or cleared by the [place] FDA and  has been authorized for detection and/or diagnosis of SARS-CoV-2 by FDA under an Emergency Use Authorization (EUA). This EUA will remain  in effect (meaning this test can be used) for the duration of the COVID-19 declaration under Section 56 4(b)(1) of the Act, 21 U.S.C. section 360bbb-3(b)(1), unless the authorization is terminated or revoked sooner. Performed at [name], [address] [place], [place] [address]   MRSA PCR Screening     Status: None   Collection Time: 12/10/18  2:36 PM   Specimen: Nasal Mucosa; Nasopharyngeal  Result Value Ref Range Status   MRSA by PCR NEGATIVE NEGATIVE Final    Comment:        The GeneXpert MRSA Assay (FDA approved for NASAL specimens only), is one component of a comprehensive MRSA colonization surveillance program. It is not intended to diagnose MRSA infection nor to guide or monitor treatment for MRSA infections. Performed at [facility], [address] [place], [place] [address]       Radiology Studies: Dg Abd 1 View  Result Date: 12/16/2018 CLINICAL DATA:  Ileus. EXAM: ABDOMEN - 1 VIEW COMPARISON:  None. FINDINGS: There is a dilated loop of small bowel in the mid abdomen measuring approximately 3.6 cm. There is no obvious pneumatosis or free air. The patient's enteric tube is outside the field of view. IMPRESSION: Mildly dilated loop of small bowel in the mid abdomen is nonspecific and could represent an ileus or partial small bowel obstruction the appropriate clinical setting. Electronically  Signed   By: Katherine Mantle M.D.   On: 12/16/2018 15:04   Dg Chest Port 1 View  Result Date: 12/16/2018 CLINICAL DATA:  Shortness of breath. EXAM: PORTABLE CHEST 1 VIEW COMPARISON:   December 15, 2018. FINDINGS: Again noted are low lung volumes with bibasilar atelectasis. The cardiac silhouette remains enlarged. There is no pneumothorax. No large pleural effusion. No acute osseous abnormality. An enteric tube is noted. IMPRESSION: Stable appearance of the chest. Electronically Signed   By: Katherine Mantle M.D.   On: 12/16/2018 15:02     Scheduled Meds: . chlorhexidine  15 mL Mouth Rinse BID  . Chlorhexidine Gluconate Cloth  6 each Topical Q0600  . cloBAZam  60 mg Per Tube BID  . heparin injection (subcutaneous)  5,000 Units Subcutaneous Q8H  . levothyroxine  25 mcg Per Tube Q0600  . lidocaine (PF)  5 mL Other Once  . mouth rinse  15 mL Mouth Rinse q12n4p  . nystatin   Topical Daily  . pantoprazole sodium  40 mg Per Tube BID  . polyethylene glycol  17 g Per Tube BID  . sennosides  5 mL Per Tube BID  . sodium chloride flush  10-40 mL Intracatheter Q12H  . sodium chloride flush  3 mL Intravenous Q12H  . thiamine  100 mg Per Tube Daily   Continuous Infusions: . sodium chloride Stopped (12/16/18 2230)  . dextrose 5% lactated ringers Stopped (12/17/18 0616)  . erythromycin 100 mL/hr at 12/17/18 0700  . Immune Globulin 10% Stopped (12/16/18 2032)  . levETIRAcetam 1,000 mg (12/17/18 0848)  . thiamine injection Stopped (12/16/18 2259)     LOS: 9 days    Time spent: 25 minutes spent in the coordination of care today.    Teddy Spike, DO [facility] Pager [phone]  If 7PM-7AM, please contact night-coverage [email or web address] Password [facility] 12/17/2018, 8:58 AM

## 2018-12-17 NOTE — Progress Notes (Signed)
[facility] Liaison note Telephone call to patient's mother [name] Macintyre, unable to leave a message. Will continue to attempt to reach her via telephone. Chart notes reviewed. Note dietician recommendations for initiation of TPN and Cortrak feeding tube. NG tube remains in place. Patient is receiving IV fluids Lumbar puncture was completed on 11/20. IVIG initiated for 5 days. IV thiamine has been transitioned to per tube and B12 injections daily x 7 days. Appreciative of Palliative Medicine continued involvement and support to [place]. Will continue to follow and update hospice team. Flo Shanks BSN, RN, [place] [phone]

## 2018-12-17 NOTE — Progress Notes (Signed)
Subjective: No significant changes  Exam: Vitals:   12/17/18 0900 12/17/18 1000  BP: (!) 149/81 (!) 152/68  Pulse: (!) 123 (!) 134  Resp: (!) 21 17  Temp:    SpO2: 96% 100%   Gen: In bed, NAD Resp: non-labored breathing, no acute distress Abd: soft, nt  Neuro: MS: Eyes open, nonverbal at baseline, does not follow commands CN: Pupils equal round reactive, eyes cross midline in both directions, blinks to threat Motor: He moves his bilateral arms against gravity, very little movement in lower extremities, though there is minimal flexion to noxious stimulation Sensory: He grimaces to noxious stimulation of the lower extremities DTR: Areflexic throughout  Pertinent Labs: CSF -0 WBC -550 RBC -Protein 172 -Glucose 60  Zinc- pending Serum copper- normal at 122 B12 low at 159 B1-pending  Impression: 23 year old male with autism and seizure disorder who presents with an acute to subacute neuropathy in the setting of prolonged poor p.o. intake due to nausea and vomiting.  I agree with Dr. 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  He has finished 3 days of high-dose IV thiamine.  With his elevated protein in the setting of an acute neuropathy, I also agree with treatment with IVIG, though I think that the nutritional aspect is more likely.  With his low B12, this could be playing a role as well I would favor repletion of this also.  Recommendations: 1) continue thiamine supplementation 2) start multivitamin 3) B12 injection, 1000 mcg daily for 3 days 4) IVIG day 3/5 5) neurology will follow  Roland Rack, MD [facility] [phone]  If 7pm- 7am, please page neurology on call as listed in [place].

## 2018-12-17 NOTE — Progress Notes (Signed)
NAME:  Aaron Vega, MRN:  [id number], DOB:  27-Apr-1995, LOS: 9 ADMISSION DATE:  December 31, 2018, CONSULTATION DATE:  12/10/2018 REFERRING MD:  Earnest Conroy - [facility], CHIEF COMPLAINT:  Rising lactate level.  Brief History   23 year old autistic man with abdominal distension and vomiting.  History of present illness   Known for poor and declining health status from severe autism with seizures, severe constipation recently.  His QoL was significantly better prior to 08/2018 when he suffered a severe seizure at home and struck his head. Seen in ED only but has has worsening mental status since. Per chart, this was ascribed to  hypoxic ischemic encephalopathy following prolonged seizure.   Presented via ED for increasing lethargy and vomiting and decreased urine output.   Significant disconnect regarding goals of care as currently receiving hospice care at home but still full code and mother not yet willing to place limits on care.  Was unable to have MRI performed 12/12/2018 secondary to agitation and risk of respiratory depression with sedation  Post lumbar puncture 12/14/2018  Past Medical History   Past Medical History:  Diagnosis Date   Autistic disorder, current or active state    Fatty liver    Hypertension    Obesity    Pancreatitis    at age 15 years   Pneumonia    Pre-diabetes    Seizures ([place])    as of 11/23/15 - no seizures for 3 month   Sleep apnea    not on cpap (can't tolerate)   Tics of organic origin    Tourette syndrome    Past Surgical History:  Procedure Laterality Date   BIOPSY  11/08/2018   Procedure: BIOPSY;  Surgeon: Jerene Bears, MD;  Location: [facility];  Service: Gastroenterology;;   ESOPHAGOGASTRODUODENOSCOPY (EGD) WITH PROPOFOL N/A 11/08/2018   Procedure: ESOPHAGOGASTRODUODENOSCOPY (EGD) WITH PROPOFOL;  Surgeon: Jerene Bears, MD;  Location: [facility];  Service: Gastroenterology;  Laterality: N/A;   FLEXIBLE SIGMOIDOSCOPY N/A 11/21/2018     Procedure: FLEXIBLE SIGMOIDOSCOPY;  Surgeon: Rush Landmark Telford Nab., MD;  Location: [facility];  Service: Gastroenterology;  Laterality: N/A;   IMPACTION REMOVAL  11/21/2018   Procedure: IMPACTION REMOVAL;  Surgeon: Irving Copas., MD;  Location: [facility];  Service: Gastroenterology;;   RADIOLOGY WITH ANESTHESIA N/A 11/24/2015   Procedure: CT SCAN ABDOMEN AND PELVIS WITH CONTRAST;  Surgeon: Medication Radiologist, MD;  Location: [place];  Service: Radiology;  Laterality: N/A;   TONSILLECTOMY     and adenoidectomy    Significant Hospital Events   11/14 admission to [facility]. 11/15 increasing lactate despite fluids. 12/10/2018 transfer to intensive care unit 12/15/2018 transferred to medical floor  Consults:  PCCM 11/16. Urology 12/10/2018 Neurology  Procedures:  11/09/2018 NG tube placed per interventional radiology 12/14/2018 lumbar puncture  Significant Diagnostic Tests:  11/14 - CT abdomen/pelvis: obese, small LLL infiltrate, dilated gas filled loops of small and large bowel to rectum, no transition. No gastric distension, moderate stool burden in ascending colon. 12/10/2018 MRI brain cervical vertebrae with abnormal T2 signal restricted diffusion both medial thalami most likely diagnosis of Warnicke's encephalopathy  Micro Data:  SARS-Cov2 -negative Blood cultures -no growth to date Urine culture - negative  Antimicrobials:  Cefepime 11/14 - 11/18 Flagyl 11/14-11/18  Interim history/subjective:  11/23: No reported events overnight. Oxygen not in pt's nose and sats 98%. Strong cough witnessed.    pt's mother at bedside and asking why pt was transferred back to ICU and what is going on with  him. Attempted to go over the entire hospital course with her but she states she has not been "told any of this" and then asks "is he going to make it?".  I atetmpted to again provide reassurance that her son was improving somewhat and does not require ICU placement which  should provide some reassurance, she then asked if he was getting better. Again I attempted to let her know that his clinical exam has improved so hopefully things were moving in correct direction and that he no longer req ICU so that is improvement she then again asks, "if he's going to make it". Questioning is not appropriate with information provided. Pt unable to provide any information or guidance.  11/22: Sleeping, no significant overnight events More aware this morning  Objective   Blood pressure (!) 150/105, pulse (!) 130, temperature 98.6 F (37 C), temperature source Oral, resp. rate (!) 26, height 5\' 11"  (1.803 m), weight (!) 136.3 kg, SpO2 94 %.        Intake/Output Summary (Last 24 hours) at 12/17/2018 0847 Last data filed at 12/17/2018 0700 Gross per 24 hour  Intake 2299.53 ml  Output 870 ml  Net 1429.53 ml   Filed Weights   12/14/18 0344 12/15/18 0404 12/17/18 0500  Weight: (!) 148.7 kg (!) 145.7 kg (!) 136.3 kg    Examination: General: Morbidly obese, tachycardic HEENT: Short neck. NCAT, moving spontaneously bue Neuro: Not following commands CV: S1-S2 appreciated PULM: clear bilaterally with strong cough GI: Bowel sounds appreciated but diminished extremities: Warm/dry, 2+ edema Skin: no rashes or lesions   Chest x-ray significant for hypoventilated lung fields  Resolved Hospital Problem list   Hypernatremia  Assessment & Plan:  Acute on chronic respiratory failure Obstructive sleep apnea history, not tolerant of positive pressure ventilation -Minimize sedation -Oxygen supplementation  Possible aspiration pneumonia -Completed antibiotics -Hypoventilated lung fields -Aspiration precautions  Suspected sepsis -Fever trend was downwards  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 -May require further intervention   Ileus -Patient started on erythromycin IV -Transition to p.o. erythromycin  -bm last pm  Lumbar puncture CSF with elevated protein -Tap was traumatic  Ileus -cont erythromycin for promotility -Resume trickle feeds   Daily Goals Checklist  Pain/Anxiety/Delirium protocol (if indicated): Lorazepam for seizures Respiratory support goals: Nasal cannula DVT prophylaxis: Heparin 3 times daily Nutritional status and feeding goals: N.p.o. 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 Monitor when start feeding. Mobility/therapy needs: Bedrest Antibiotic de-escalation: Antibiotics discontinued 12/12/2018 Home medication reconciliation: Restart Omfi once enteral access available Daily labs: BMP daily Code Status: Full code Family Communication: Discussed with mom at bedside 11/23 Disposition: Stepdown appropriate. CCM will sign off of pt.   Labs   CBC: Recent Labs  Lab 12/11/18 0659  WBC 5.0  NEUTROABS 3.1  HGB 10.5*  HCT 32.0*  MCV 84.4  PLT 306    Basic Metabolic Panel: Recent Labs  Lab 12/11/18 0659 12/11/18 1719 12/11/18 1836 12/12/18 0532 12/12/18 1648 12/13/18 0633 12/15/18 0436  NA 151*  --  151*  --   --  138 138  K 3.7  --  3.3*  --   --  4.4 5.3*  CL 111  --  116*  --   --  98 100  CO2 26  --  23  --   --  29 24  GLUCOSE 82  --  87  --   --  112* 79  BUN 17  --  17  --   --  5* <5*  CREATININE 0.59*  --  0.60*  --   --  0.47* 0.43*  CALCIUM 8.4*  --  8.3*  --   --  8.5* 9.0  MG  --  1.8  --  1.8 1.5* 2.2 1.8  PHOS  --  2.2*  --  2.6 3.1 3.6 3.5   [id number]   Briant SitesJessica Larue Drawdy DO After hours pager: [phone]  [facility] 12/17/2018, 8:47 AM

## 2018-12-18 DIAGNOSIS — A419 Sepsis, unspecified organism: Secondary | ICD-10-CM | POA: Diagnosis not present

## 2018-12-18 DIAGNOSIS — K567 Ileus, unspecified: Secondary | ICD-10-CM | POA: Diagnosis not present

## 2018-12-18 DIAGNOSIS — R112 Nausea with vomiting, unspecified: Secondary | ICD-10-CM | POA: Diagnosis not present

## 2018-12-18 DIAGNOSIS — R29898 Other symptoms and signs involving the musculoskeletal system: Secondary | ICD-10-CM | POA: Diagnosis not present

## 2018-12-18 DIAGNOSIS — E43 Unspecified severe protein-calorie malnutrition: Secondary | ICD-10-CM

## 2018-12-18 DIAGNOSIS — Z7189 Other specified counseling: Secondary | ICD-10-CM | POA: Diagnosis not present

## 2018-12-18 DIAGNOSIS — Z515 Encounter for palliative care: Secondary | ICD-10-CM | POA: Diagnosis not present

## 2018-12-18 DIAGNOSIS — G629 Polyneuropathy, unspecified: Secondary | ICD-10-CM | POA: Diagnosis not present

## 2018-12-18 LAB — CBC WITH DIFFERENTIAL/PLATELET
Abs Immature Granulocytes: 0.1 10*3/uL — ABNORMAL HIGH (ref 0.00–0.07)
Basophils Absolute: 0 10*3/uL (ref 0.0–0.1)
Basophils Relative: 0 %
Eosinophils Absolute: 0.2 10*3/uL (ref 0.0–0.5)
Eosinophils Relative: 3 %
HCT: 30.1 % — ABNORMAL LOW (ref 39.0–52.0)
Hemoglobin: 9.9 g/dL — ABNORMAL LOW (ref 13.0–17.0)
Immature Granulocytes: 1 %
Lymphocytes Relative: 25 %
Lymphs Abs: 1.8 10*3/uL (ref 0.7–4.0)
MCH: 28.6 pg (ref 26.0–34.0)
MCHC: 32.9 g/dL (ref 30.0–36.0)
MCV: 87 fL (ref 80.0–100.0)
Monocytes Absolute: 1 10*3/uL (ref 0.1–1.0)
Monocytes Relative: 15 %
Neutro Abs: 3.8 10*3/uL (ref 1.7–7.7)
Neutrophils Relative %: 56 %
Platelets: 383 10*3/uL (ref 150–400)
RBC: 3.46 MIL/uL — ABNORMAL LOW (ref 4.22–5.81)
RDW: 12.8 % (ref 11.5–15.5)
WBC: 7 10*3/uL (ref 4.0–10.5)
nRBC: 0 % (ref 0.0–0.2)

## 2018-12-18 LAB — ZINC: Zinc: 120 ug/dL (ref 56–134)

## 2018-12-18 LAB — GLUCOSE, CAPILLARY
Glucose-Capillary: 89 mg/dL (ref 70–99)
Glucose-Capillary: 101 mg/dL — ABNORMAL HIGH (ref 70–99)
Glucose-Capillary: 95 mg/dL (ref 70–99)
Glucose-Capillary: 94 mg/dL (ref 70–99)
Glucose-Capillary: 95 mg/dL (ref 70–99)

## 2018-12-18 LAB — COMPREHENSIVE METABOLIC PANEL
ALT: 134 U/L — ABNORMAL HIGH (ref 0–44)
AST: 117 U/L — ABNORMAL HIGH (ref 15–41)
Albumin: 2.6 g/dL — ABNORMAL LOW (ref 3.5–5.0)
Alkaline Phosphatase: 33 U/L — ABNORMAL LOW (ref 38–126)
Anion gap: 11 (ref 5–15)
BUN: <5 mg/dL — ABNORMAL LOW (ref 6–20)
CO2: 26 mmol/L (ref 22–32)
Calcium: 9 mg/dL (ref 8.9–10.3)
Chloride: 100 mmol/L (ref 98–111)
Creatinine, Ser: 0.42 mg/dL — ABNORMAL LOW (ref 0.61–1.24)
GFR calc Af Amer: >60 mL/min (ref >60)
GFR calc non Af Amer: >60 mL/min (ref >60)
Glucose, Bld: 99 mg/dL (ref 70–99)
Potassium: 3.4 mmol/L — ABNORMAL LOW (ref 3.5–5.1)
Sodium: 137 mmol/L (ref 135–145)
Total Bilirubin: 1.1 mg/dL (ref 0.3–1.2)
Total Protein: 8.2 g/dL — ABNORMAL HIGH (ref 6.5–8.1)

## 2018-12-18 LAB — VITAMIN B1: Vitamin B1 (Thiamine): 28.2 nmol/L — ABNORMAL LOW (ref 66.5–200.0)

## 2018-12-18 LAB — MAGNESIUM: Magnesium: 1.6 mg/dL — ABNORMAL LOW (ref 1.7–2.4)

## 2018-12-18 MED ORDER — METOPROLOL TARTRATE 12.5 MG HALF TABLET
12.5000 mg | ORAL_TABLET | Freq: Two times a day (BID) | ORAL | Status: DC
  Administered 2018-12-18: 09:00:00 12.5 mg via NASOGASTRIC
  Filled 2018-12-18: qty 1

## 2018-12-18 MED ORDER — METOPROLOL TARTRATE 5 MG/5ML IV SOLN
5.0000 mg | Freq: Once | INTRAVENOUS | Status: DC
  Administered 2018-12-18: 03:00:00 5 mg via INTRAVENOUS
  Filled 2018-12-18: qty 5

## 2018-12-18 MED ORDER — METOPROLOL TARTRATE 5 MG/5ML IV SOLN
5.0000 mg | Freq: Four times a day (QID) | INTRAVENOUS | Status: DC | PRN
  Administered 2018-12-18 – 2019-01-08 (×11): 5 mg via INTRAVENOUS
  Filled 2018-12-18 (×15): qty 5

## 2018-12-18 MED ORDER — POTASSIUM CHLORIDE 10 MEQ/100ML IV SOLN
10.0000 meq | INTRAVENOUS | Status: AC
  Administered 2018-12-18 (×4): 10 meq via INTRAVENOUS
  Filled 2018-12-18 (×4): qty 100

## 2018-12-18 MED ORDER — SODIUM CHLORIDE 0.9 % IV SOLN
500.0000 mg | Freq: Three times a day (TID) | INTRAVENOUS | Status: DC
  Administered 2018-12-18 (×2): 500 mg via INTRAVENOUS
  Filled 2018-12-18 (×6): qty 10

## 2018-12-18 MED ORDER — METOPROLOL TARTRATE 25 MG/10 ML ORAL SUSPENSION
50.0000 mg | Freq: Two times a day (BID) | ORAL | Status: DC
  Administered 2018-12-18 – 2018-12-20 (×4): 50 mg via NASOGASTRIC
  Filled 2018-12-18 (×5): qty 20

## 2018-12-18 MED ORDER — VITAL 1.5 CAL PO LIQD
1000.0000 mL | ORAL | Status: DC
  Administered 2018-12-19 – 2018-12-20 (×2): 1000 mL
  Filled 2018-12-18 (×4): qty 1000

## 2018-12-18 MED ORDER — IMMUNE GLOBULIN (HUMAN) 10 GM/100ML IV SOLN
400.0000 mg/kg | INTRAVENOUS | Status: AC
  Administered 2018-12-19: 60 g via INTRAVENOUS
  Filled 2018-12-18 (×2): qty 600

## 2018-12-18 MED ORDER — LISINOPRIL 20 MG PO TABS
20.0000 mg | ORAL_TABLET | Freq: Every day | ORAL | Status: DC
  Administered 2018-12-18 – 2018-12-27 (×8): 20 mg via NASOGASTRIC
  Filled 2018-12-18 (×9): qty 1

## 2018-12-18 MED ORDER — MAGNESIUM OXIDE 400 (241.3 MG) MG PO TABS
400.0000 mg | ORAL_TABLET | Freq: Two times a day (BID) | ORAL | Status: DC
  Administered 2018-12-18 – 2018-12-31 (×27): 400 mg via NASOGASTRIC
  Filled 2018-12-18 (×27): qty 1

## 2018-12-18 NOTE — Progress Notes (Signed)
Cortrak Tube Team Note:  Consult received to place a Cortrak feeding tube.   Due to high volume today unable to place Cortrak feeding tube in patient. Called and discussed with RN. Options include bedside NGT placement, feeding tube placement under fluoroscopy ([id number]), or Cortrak tube can be placed on Friday (next day of Cortrak service).   Jacklynn Barnacle, MS, RD, LDN Office: [phone] Pager: [phone] After Hours/Weekend Pager: [phone]-

## 2018-12-18 NOTE — Progress Notes (Signed)
Subjective: No significant changes  Exam: Vitals:   12/18/18 0800 12/18/18 0900  BP: (!) 147/106 (!) 160/137  Pulse: (!) 124 (!) 126  Resp: (!) 31 13  Temp: 100.1 F (37.8 C)   SpO2: 99% 100%   Gen: In bed, NAD Resp: non-labored breathing, no acute distress Abd: soft, nt  Neuro: MS: Eyes open, nonverbal at baseline, does not follow commands CN: Pupils equal round reactive, eyes cross midline in both directions, blinks to threat Motor: He moves his bilateral arms against gravity, proximal flexion to noxious stimulation bilaterally.  Sensory: He grimaces to noxious stimulation of the lower extremities DTR: Areflexic throughout  Pertinent Labs: CSF -0 WBC -550 RBC -Protein 172 -Glucose 60  Zinc- normal at 120 Serum copper- normal at 122 B12 low at 159 B1-pending  Impression: 23 year old male with autism and seizure disorder who presents with an acute to subacute neuropathy in the setting of prolonged poor p.o. intake due to nausea and vomiting, suspected ileus.  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  He has finished 3 days of high-dose IV thiamine.  With his elevated protein in the setting of an acute neuropathy, I also agree with treatment with IVIG, though I think that the nutritional aspect is more likely.  With his low B12, this could be playing a role as well I would favor repletion of this also.  Recommendations: 1) continue thiamine supplementation 2) multivitamin 3) B12 injection, 1000 mcg day 2/3 4) IVIG day 4/5 5) neurology will follow  Roland Rack, MD [facility] [phone]  If 7pm- 7am, please page neurology on call as listed in [place].

## 2018-12-18 NOTE — Progress Notes (Addendum)
[facility] Liaison note Patient's mother met today with hospice social worker Atha Starks. She has signed the revocation of hospice benefit form. Patient is no longer followed by [facility] hospice. [place] made aware. Patient can be followed by our community palliative program at discharge.  Flo Shanks BSN, RN, [facility] Liaison [phone]

## 2018-12-18 NOTE — Progress Notes (Signed)
Marland Kitchen  PROGRESS NOTE    Aaron Vega  GNF:[id number] DOB: August 23, 1995 DOA: December 29, 2018 PCP: [facility]   Brief Narrative:   Known for poor and declining health status from severe autism with seizures, severe constipation recently.  His QoL was significantly better prior to 08/2018 when he suffered a severe seizure at home and struck his head. Seen in ED only but has has worsening mental status since. Per chart, this was ascribed to hypoxic ischemic encephalopathy following prolonged seizure.  Presented via ED for increasing lethargy and vomiting and decreased urine output.  Significant disconnect regarding goals of care as currently receiving hospice care at home but still full code and mother not yet willing to place limits on care. Was unable to have MRI performed 12/12/2018 secondary to agitation and risk of respiratory depression with sedation  11/23: BM noted ON. Otherwise, remains tachy.  11/24: Tolerated trickle feeds ON. Let's convert some meds to PO. Advance TFs as tolerated. Place coretrak today.    Assessment & Plan:   Principal Problem:   Severe sepsis (HCC) Active Problems:   Autistic disorder   Intractable nausea and vomiting   Seizure disorder (HCC)   Constipation in male   Obesity, Class III, BMI 40-49.9 (morbid obesity) (HCC)   Ileus (HCC)   Aspiration pneumonia (HCC)   Community acquired pneumonia of left lower lobe of lung   Goals of care, counseling/discussion   Palliative care by specialist   DNR (do not resuscitate) discussion   Protein-calorie malnutrition, severe  Acute on chronic respiratory failure Obstructive sleep apnea history, not tolerant of positive pressure ventilation Possible aspiration pneumonia Sepsis secondary to asp PNA     - currently on 3L [place], sats mid to high 90s     - s/p cefepime, flagyl x 3 days     - continue aspiration precautions     - 11/24: continue to wean O2 as able; he's a little more active this  AM and sats are excellent  Encephalopathy, cause unclear likely multifactorial 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 Seizure d/o     - neurology onboard, appreciate assistance     - continue thiamine     - continue keppra, onfi     - s/p LP     - 11/24: eyes open and tracking a little more today, not following commands, continue thiamine/B12, continue AEDs  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 Barre     - IVIG, end date 11/25     - appreciate neurology assistance  Ileus 50 pound weight loss     - on erythromycin     - trickle feeds attempted, but not successful     - he has had a large BM this AM; and there is minimal in the NGT container, can attempt trickle feeds again     - 11/24: tolerated trickle feeds ON; increase as tolerated  HTN Tachycardia     - had metoprolol PRN; will schedule q6h and increase to      - if he tolerates the trickle feeds, can consider transition to PO     - 11/24: tolerated trickle feeds, transition metoprolol to PO  BID and add lisinopril ; will have PRN metoprolol as well     - check EKG  DVT prophylaxis: heparin Code Status: FULL Family Communication: with mother at bedside   Disposition Plan: TBD  Consultants:   PCCM  Antimicrobials:  . erythromycin  Subjective: Tolerated trickle feeds last night. Active this AM.   Objective: Vitals:  12/18/18 0600 12/18/18 0700 12/18/18 0800 12/18/18 0900  BP: (!) 145/108 (!) 160/104 (!) 147/106 (!) 160/137  Pulse: (!) 117 (!) 122 (!) 124 (!) 126  Resp: 15 (!) 28 (!) 31 13  Temp:   100.1 F (37.8 C)   TempSrc:   Oral   SpO2: 100% 97% 99% 100%  Weight:      Height:        Intake/Output Summary (Last 24 hours) at 12/18/2018 1048 Last data filed at 12/18/2018 0800 Gross per 24 hour  Intake 1009.18 ml  Output 900 ml  Net 109.18 ml   Filed Weights   12/15/18 0404 12/17/18 0500 12/18/18 0500  Weight: (!) 145.7 kg (!) 136.3 kg (!) 136.9 kg    Examination:  General:  23 y.o. male resting in bed in NAD Cardiovascular: RRR, +S1, S2, no m/g/r Respiratory: CTABL, no w/r/r, normal WOB GI: BS+, NDNT, obese MSK: No e/c/c Neuro: more active this AM, eye tracking a little more than yesterday, but still not following commands  Data Reviewed: I have personally reviewed following labs and imaging studies.  CBC: Recent Labs  Lab 12/18/18 0159  WBC 7.0  NEUTROABS 3.8  HGB 9.9*  HCT 30.1*  MCV 87.0  PLT 383   Basic Metabolic Panel: Recent Labs  Lab 12/11/18 1719 12/11/18 1836 12/12/18 0532 12/12/18 1648 12/13/18 0633 12/15/18 0436 12/18/18 0159  NA  --  151*  --   --  138 138 137  K  --  3.3*  --   --  4.4 5.3* 3.4*  CL  --  116*  --   --  98 100 100  CO2  --  23  --   --  29 24 26   GLUCOSE  --  87  --   --  112* 79 99  BUN  --  17  --   --  5* <5* <5*  CREATININE  --  0.60*  --   --  0.47* 0.43* 0.42*  CALCIUM  --  8.3*  --   --  8.5* 9.0 9.0  MG 1.8  --  1.8 1.5* 2.2 1.8 1.6*  PHOS 2.2*  --  2.6 3.1 3.6 3.5  --    GFR: Estimated Creatinine Clearance: 206.4 mL/min (A) (by C-G formula based on SCr of 0.42 mg/dL (L)). Liver Function Tests: Recent Labs  Lab 12/14/18 1729 12/18/18 0159  AST  --  117*  ALT  --  134*  ALKPHOS  --  33*  BILITOT  --  1.1  PROT  --  8.2*  ALBUMIN 3.3* 2.6*   No results for input(s): LIPASE, AMYLASE in the last 168 hours. No results for input(s): AMMONIA in the last 168 hours. Coagulation Profile: Recent Labs  Lab 12/12/18 0532 12/13/18 1530 12/14/18 0841  INR 3.6* 1.1 1.0   Cardiac Enzymes: No results for input(s): CKTOTAL, CKMB, CKMBINDEX, TROPONINI in the last 168 hours. BNP (last 3 results) No results for input(s): PROBNP in the last 8760 hours. HbA1C: No results for input(s): HGBA1C in the last 72 hours. CBG: Recent Labs  Lab 12/17/18 1523 12/17/18 1925 12/17/18 2313 12/18/18 0400 12/18/18 0758  GLUCAP 84 86 89 89 101*   Lipid Profile: No results for input(s): CHOL, HDL, LDLCALC,  TRIG, CHOLHDL, LDLDIRECT in the last 72 hours. Thyroid Function Tests: No results for input(s): TSH, T4TOTAL, FREET4, T3FREE, THYROIDAB in the last 72 hours. Anemia Panel: No results for input(s): VITAMINB12, FOLATE, FERRITIN, TIBC, IRON, RETICCTPCT in the  last 72 hours. Sepsis Labs: No results for input(s): PROCALCITON, LATICACIDVEN in the last 168 hours.  Recent Results (from the past 240 hour(s))  Urine culture     Status: None   Collection Time: 12/14/2018  2:11 PM   Specimen: In/Out Cath Urine  Result Value Ref Range Status   Specimen Description IN/OUT CATH URINE  Final   Special Requests NONE  Final   Culture   Final    NO GROWTH Performed at [facility], [address], [place], [place] [address]    Report Status 12/09/2018 FINAL  Final  Blood Culture (routine x 2)     Status: None   Collection Time: 12/22/2018  3:00 PM   Specimen: BLOOD RIGHT HAND  Result Value Ref Range Status   Specimen Description BLOOD RIGHT HAND  Final   Special Requests   Final    BOTTLES DRAWN AEROBIC ONLY Blood Culture results may not be optimal due to an inadequate volume of blood received in culture bottles   Culture   Final    NO GROWTH 5 DAYS Performed at [facility], [address], [place], [place] [address]    Report Status 12/13/2018 FINAL  Final  Blood Culture (routine x 2)     Status: None   Collection Time: 12/24/2018  3:08 PM   Specimen: BLOOD  Result Value Ref Range Status   Specimen Description BLOOD RIGHT ANTECUBITAL  Final   Special Requests   Final    BOTTLES DRAWN AEROBIC AND ANAEROBIC Blood Culture results may not be optimal due to an inadequate volume of blood received in culture bottles   Culture   Final    NO GROWTH 5 DAYS Performed at [facility], [address], [place], [place] [address]    Report Status 12/13/2018 FINAL  Final  SARS CORONAVIRUS 2 (TAT 6-24 HRS) Nasopharyngeal Nasopharyngeal Swab     Status: None   Collection Time: 12/02/2018  4:06 PM    Specimen: Nasopharyngeal Swab  Result Value Ref Range Status   SARS Coronavirus 2 NEGATIVE NEGATIVE Final    Comment: (NOTE) SARS-CoV-2 target nucleic acids are NOT DETECTED. The SARS-CoV-2 RNA is generally detectable in upper and lower respiratory specimens during the acute phase of infection. Negative results do not preclude SARS-CoV-2 infection, do not rule out co-infections with other pathogens, and should not be used as the sole basis for treatment or other patient management decisions. Negative results must be combined with clinical observations, patient history, and epidemiological information. The expected result is Negative. Fact Sheet for Patients: HairSlick.no Fact Sheet for Healthcare Providers: quierodirigir.com This test is not yet approved or cleared by the [place] FDA and  has been authorized for detection and/or diagnosis of SARS-CoV-2 by FDA under an Emergency Use Authorization (EUA). This EUA will remain  in effect (meaning this test can be used) for the duration of the COVID-19 declaration under Section 56 4(b)(1) of the Act, 21 U.S.C. section 360bbb-3(b)(1), unless the authorization is terminated or revoked sooner. Performed at [facility], [address] [place], [place] [address]   MRSA PCR Screening     Status: None   Collection Time: 12/10/18  2:36 PM   Specimen: Nasal Mucosa; Nasopharyngeal  Result Value Ref Range Status   MRSA by PCR NEGATIVE NEGATIVE Final    Comment:        The GeneXpert MRSA Assay (FDA approved for NASAL specimens only), is one component of a  comprehensive MRSA colonization surveillance program. It is not intended to diagnose MRSA infection nor to guide or monitor treatment for MRSA infections. Performed at [facility], [place] [address], [place], [place] [address]       Radiology Studies: Dg Abd 1 View  Result Date: 12/16/2018 CLINICAL DATA:   Ileus. EXAM: ABDOMEN - 1 VIEW COMPARISON:  None. FINDINGS: There is a dilated loop of small bowel in the mid abdomen measuring approximately 3.6 cm. There is no obvious pneumatosis or free air. The patient's enteric tube is outside the field of view. IMPRESSION: Mildly dilated loop of small bowel in the mid abdomen is nonspecific and could represent an ileus or partial small bowel obstruction the appropriate clinical setting. Electronically Signed   By: Constance Holster M.D.   On: 12/16/2018 15:04   Dg Chest Port 1 View  Result Date: 12/16/2018 CLINICAL DATA:  Shortness of breath. EXAM: PORTABLE CHEST 1 VIEW COMPARISON:  December 15, 2018. FINDINGS: Again noted are low lung volumes with bibasilar atelectasis. The cardiac silhouette remains enlarged. There is no pneumothorax. No large pleural effusion. No acute osseous abnormality. An enteric tube is noted. IMPRESSION: Stable appearance of the chest. Electronically Signed   By: Constance Holster M.D.   On: 12/16/2018 15:02     Scheduled Meds: . chlorhexidine  15 mL Mouth Rinse BID  . Chlorhexidine Gluconate Cloth  6 each Topical Q0600  . cloBAZam  60 mg Per Tube BID  . cyanocobalamin  1,000 mcg Intramuscular Daily  . feeding supplement (VITAL 1.5 CAL)  1,000 mL Per Tube Q24H  . heparin injection (subcutaneous)  5,000 Units Subcutaneous Q8H  . levothyroxine  25 mcg Per Tube Q0600  . lidocaine (PF)  5 mL Other Once  . lisinopril  20 mg Per NG tube Daily  . magnesium oxide  400 mg Per NG tube BID  . mouth rinse  15 mL Mouth Rinse q12n4p  . metoprolol tartrate  50 mg Per NG tube BID  . multivitamin  15 mL Per Tube Daily  . nystatin   Topical Daily  . pantoprazole sodium  40 mg Per Tube BID  . polyethylene glycol  17 g Per Tube BID  . sennosides  5 mL Per Tube BID  . sodium chloride flush  10-40 mL Intracatheter Q12H  . sodium chloride flush  3 mL Intravenous Q12H  . thiamine  100 mg Per Tube Daily   Continuous Infusions: . sodium  chloride Stopped (12/16/18 2230)  . dextrose 5% lactated ringers Stopped (12/17/18 1532)  . erythromycin Stopped (12/18/18 7867)  . Immune Globulin 10% 60 g (12/17/18 1542)  . levETIRAcetam 1,000 mg (12/18/18 0912)  . potassium chloride       LOS: 10 days    Time spent: 35 minutes spent in the coordination of care today.   Jonnie Finner, DO [facility] Pager [phone]  If 7PM-7AM, please contact night-coverage [email or web address] Password [facility] 12/18/2018, 10:48 AM

## 2018-12-18 NOTE — Progress Notes (Signed)
 Nutrition Follow-up  DOCUMENTATION CODES:   Severe malnutrition in context of acute illness/injury  INTERVENTION:  If pt does not tolerate advancement of TF, recommend initiation of TPN at this time. If tolerates gastric feedings, does not need post-pyloric placement  Tube Feeding:  GOAL: Vital 1.5 at 60 ml/hr, Pro-Stat 30 mL  Titration: Increase TF to 30 ml/hr, titrate by 10 mL q 12 hours until goal rate of 60 ml/hr Provides 122 g of protein, 2360 kcals, 1094 mL of free water Goal rate meets 100% of estimated calorie and protein needs   NUTRITION DIAGNOSIS:   Severe Malnutrition related to acute illness as evidenced by energy intake < or equal to 50% for > or equal to 5 days, percent weight loss.  Being addressed via nutrition support  GOAL:   Patient will meet greater than or equal to 90% of their needs  Not Met  MONITOR:   TF tolerance, Diet advancement, Labs, Weight trends  REASON FOR ASSESSMENT:   Consult Enteral/tube feeding initiation and management, Assessment of nutrition requirement/status  ASSESSMENT:   23 yo male with autism and epilepsy with 6 week hx of rapid decline in cognition and gait in addition to new peripheral neuropathic symptoms since the onset of intractable N/V for 6 weeks. Per neurology, findings most consistent with acute thiamine deficiency, pt with B12 deficiency as well. Pt is nonverbal at baseline. Additional PMH includes fatty liver, HTN, pancreatitis, OSA  Pt remains poorly responsive Order for Cortrak today  Tolerating Vital 1.5 at 20 ml/hr; plan to attempt titration today via NG tube  Vitamin B-12: 159 (L) supplementing Thiamine: pending (supplementing) Copper: 122 (wdl) Zinc: 120 (wdl)  Labs: reviewed Meds: D5-LR at 50 ml/hr, erythromycin, thiamine, B-12, liquid MVI    Diet Order:   Diet Order            Diet NPO time specified  Diet effective now              EDUCATION NEEDS:   Not appropriate for education at  this time  Skin:  Skin Assessment: Reviewed RN Assessment  Last BM:  11/23 large BM  Height:   Ht Readings from Last 1 Encounters:  11/26/2018 '5\' 11"'  (1.803 m)    Weight:   Wt Readings from Last 1 Encounters:  12/18/18 (!) 136.9 kg    Ideal Body Weight:  78.1 kg  BMI:  Body mass index is 42.09 kg/m.  Estimated Nutritional Needs:   Kcal:  2200-2500 kcals  Protein:  110-135 g  Fluid:  >/= 2 L    Serjio Deupree MS, RDN, LDN, CNSC [phone] Pager  [phone] Weekend/On-Call Pager

## 2018-12-18 NOTE — Progress Notes (Addendum)
Patient ID: Aaron Vega, male   DOB: Sep 02, 1995, 23 y.o.   MRN: [id number]  This NP visited patient at the bedside as a follow up for palliative medicine needs and emotional support.  I introduced myself to the patient's mother who is at bedside as a provider with the palliative medicine team who would be following this week.  Patient was originally seen for goals of care consult on 12/11/2018  Created space and opportunity for patient's mother to explore thoughts and feelings regarding her current medical situation.  She reports her sons history back to mid August when he had a seizure and fell and hit his head in the bathroom.  From that time on there was a cascade of events including nausea, vomiting and SBO/ constipation, altered mental status, progressive weakness.    She verbalizes her frustration in the providers are "telling me that he is improving but I do not see any improvement".  "  Things just do not make sense"  Mother is open to all offered and available medical interventions to prolong life.  She is hopeful for improvement and return to baseline.  Patient was seen today by neurology.  I spoke to Dr. Katherine Roan today and he tells me that he had discussion with his mother informing her that this would be a long process involving extensive rehabilitation.  Currently he is on day 4 out of 5 of IVIG and he remains on aggressive nutritional supplements, treating his acute neuropathy secondary to nutritional deficit.  Discussed with mother concept specific to human mortality and the limitations of medical interventions to prolong quality of life when the body begins to fail to thrive.  We spoke to [name]'s young age and all that he has been through.  Emotional support offered  Discussed with mother  the importance of continued conversation with the   medical providers regarding overall plan of care and treatment options,  ensuring decisions are within the context of the patients values  and GOCs.  Questions and concerns addressed   Discussed with Dr Leonel Ramsay  Total time spent on the unit was 60 minutes  PMT will continue to support holistically.  Greater than 50% of the time was spent in counseling and coordination of care.  Discussed with [facility] collective liaison and hospice benefit has been revoked at this time  Wadie Lessen NP  [place] Team Team Phone # [phone] Pager [phone]

## 2018-12-19 ENCOUNTER — Inpatient Hospital Stay (HOSPITAL_COMMUNITY)

## 2018-12-19 DIAGNOSIS — R197 Diarrhea, unspecified: Secondary | ICD-10-CM | POA: Diagnosis not present

## 2018-12-19 DIAGNOSIS — K861 Other chronic pancreatitis: Secondary | ICD-10-CM | POA: Diagnosis present

## 2018-12-19 DIAGNOSIS — G9341 Metabolic encephalopathy: Secondary | ICD-10-CM | POA: Diagnosis present

## 2018-12-19 DIAGNOSIS — K920 Hematemesis: Secondary | ICD-10-CM | POA: Diagnosis present

## 2018-12-19 DIAGNOSIS — Z6841 Body Mass Index (BMI) 40.0 and over, adult: Secondary | ICD-10-CM | POA: Diagnosis not present

## 2018-12-19 DIAGNOSIS — R609 Edema, unspecified: Secondary | ICD-10-CM | POA: Diagnosis not present

## 2018-12-19 DIAGNOSIS — Z66 Do not resuscitate: Secondary | ICD-10-CM | POA: Diagnosis not present

## 2018-12-19 DIAGNOSIS — R933 Abnormal findings on diagnostic imaging of other parts of digestive tract: Secondary | ICD-10-CM | POA: Diagnosis not present

## 2018-12-19 DIAGNOSIS — T17908S Unspecified foreign body in respiratory tract, part unspecified causing other injury, sequela: Secondary | ICD-10-CM | POA: Diagnosis not present

## 2018-12-19 DIAGNOSIS — G629 Polyneuropathy, unspecified: Secondary | ICD-10-CM | POA: Diagnosis not present

## 2018-12-19 DIAGNOSIS — T17908D Unspecified foreign body in respiratory tract, part unspecified causing other injury, subsequent encounter: Secondary | ICD-10-CM | POA: Diagnosis not present

## 2018-12-19 DIAGNOSIS — Z515 Encounter for palliative care: Secondary | ICD-10-CM | POA: Diagnosis not present

## 2018-12-19 DIAGNOSIS — G40909 Epilepsy, unspecified, not intractable, without status epilepticus: Secondary | ICD-10-CM | POA: Diagnosis not present

## 2018-12-19 DIAGNOSIS — A419 Sepsis, unspecified organism: Secondary | ICD-10-CM | POA: Diagnosis present

## 2018-12-19 DIAGNOSIS — F840 Autistic disorder: Secondary | ICD-10-CM | POA: Diagnosis present

## 2018-12-19 DIAGNOSIS — Z20822 Contact with and (suspected) exposure to covid-19: Secondary | ICD-10-CM | POA: Diagnosis present

## 2018-12-19 DIAGNOSIS — K117 Disturbances of salivary secretion: Secondary | ICD-10-CM | POA: Diagnosis not present

## 2018-12-19 DIAGNOSIS — R0609 Other forms of dyspnea: Secondary | ICD-10-CM | POA: Diagnosis not present

## 2018-12-19 DIAGNOSIS — E43 Unspecified severe protein-calorie malnutrition: Secondary | ICD-10-CM | POA: Diagnosis not present

## 2018-12-19 DIAGNOSIS — X58XXXS Exposure to other specified factors, sequela: Secondary | ICD-10-CM | POA: Diagnosis present

## 2018-12-19 DIAGNOSIS — E870 Hyperosmolality and hypernatremia: Secondary | ICD-10-CM | POA: Diagnosis not present

## 2018-12-19 DIAGNOSIS — R6520 Severe sepsis without septic shock: Secondary | ICD-10-CM | POA: Diagnosis not present

## 2018-12-19 DIAGNOSIS — E512 Wernicke's encephalopathy: Secondary | ICD-10-CM | POA: Diagnosis present

## 2018-12-19 DIAGNOSIS — R6521 Severe sepsis with septic shock: Secondary | ICD-10-CM | POA: Diagnosis present

## 2018-12-19 DIAGNOSIS — K560 Paralytic ileus: Secondary | ICD-10-CM | POA: Diagnosis present

## 2018-12-19 DIAGNOSIS — G931 Anoxic brain damage, not elsewhere classified: Secondary | ICD-10-CM | POA: Diagnosis present

## 2018-12-19 DIAGNOSIS — J9601 Acute respiratory failure with hypoxia: Secondary | ICD-10-CM | POA: Diagnosis not present

## 2018-12-19 DIAGNOSIS — E872 Acidosis: Secondary | ICD-10-CM | POA: Diagnosis present

## 2018-12-19 DIAGNOSIS — Z789 Other specified health status: Secondary | ICD-10-CM | POA: Diagnosis not present

## 2018-12-19 DIAGNOSIS — K567 Ileus, unspecified: Secondary | ICD-10-CM | POA: Diagnosis not present

## 2018-12-19 DIAGNOSIS — R52 Pain, unspecified: Secondary | ICD-10-CM | POA: Diagnosis present

## 2018-12-19 DIAGNOSIS — R0682 Tachypnea, not elsewhere classified: Secondary | ICD-10-CM | POA: Diagnosis not present

## 2018-12-19 DIAGNOSIS — R509 Fever, unspecified: Secondary | ICD-10-CM | POA: Diagnosis not present

## 2018-12-19 DIAGNOSIS — D65 Disseminated intravascular coagulation [defibrination syndrome]: Secondary | ICD-10-CM | POA: Diagnosis not present

## 2018-12-19 DIAGNOSIS — R451 Restlessness and agitation: Secondary | ICD-10-CM | POA: Diagnosis not present

## 2018-12-19 DIAGNOSIS — J9621 Acute and chronic respiratory failure with hypoxia: Secondary | ICD-10-CM | POA: Diagnosis present

## 2018-12-19 DIAGNOSIS — G40919 Epilepsy, unspecified, intractable, without status epilepticus: Secondary | ICD-10-CM | POA: Diagnosis present

## 2018-12-19 DIAGNOSIS — Z7189 Other specified counseling: Secondary | ICD-10-CM | POA: Diagnosis not present

## 2018-12-19 DIAGNOSIS — R1310 Dysphagia, unspecified: Secondary | ICD-10-CM | POA: Diagnosis not present

## 2018-12-19 DIAGNOSIS — G610 Guillain-Barre syndrome: Secondary | ICD-10-CM | POA: Diagnosis present

## 2018-12-19 DIAGNOSIS — J9811 Atelectasis: Secondary | ICD-10-CM | POA: Diagnosis present

## 2018-12-19 DIAGNOSIS — J690 Pneumonitis due to inhalation of food and vomit: Secondary | ICD-10-CM | POA: Diagnosis present

## 2018-12-19 LAB — BASIC METABOLIC PANEL
Anion gap: 10 (ref 5–15)
BUN: 6 mg/dL (ref 6–20)
CO2: 26 mmol/L (ref 22–32)
Calcium: 9.4 mg/dL (ref 8.9–10.3)
Chloride: 101 mmol/L (ref 98–111)
Creatinine, Ser: 0.39 mg/dL — ABNORMAL LOW (ref 0.61–1.24)
GFR calc Af Amer: >60 mL/min (ref >60)
GFR calc non Af Amer: >60 mL/min (ref >60)
Glucose, Bld: 99 mg/dL (ref 70–99)
Potassium: 3.6 mmol/L (ref 3.5–5.1)
Sodium: 137 mmol/L (ref 135–145)

## 2018-12-19 LAB — MAGNESIUM: Magnesium: 1.7 mg/dL (ref 1.7–2.4)

## 2018-12-19 LAB — GLUCOSE, CAPILLARY
Glucose-Capillary: 86 mg/dL (ref 70–99)
Glucose-Capillary: 93 mg/dL (ref 70–99)
Glucose-Capillary: 95 mg/dL (ref 70–99)
Glucose-Capillary: 97 mg/dL (ref 70–99)
Glucose-Capillary: 98 mg/dL (ref 70–99)
Glucose-Capillary: 98 mg/dL (ref 70–99)

## 2018-12-19 LAB — HSV DNA BY PCR (REFERENCE LAB)
HSV 1 DNA: NEGATIVE
HSV 2 DNA: NEGATIVE

## 2018-12-19 IMAGING — MR MR LUMBAR SPINE WO/W CM
8 of 16 series · 23 of 48 positions shown · IV contrast (gadavist)
Comparison: CT thoracic lumbar [DATE]

CLINICAL DATA: Quadriplegia

EXAM:
MRI THORACIC AND LUMBAR SPINE WITHOUT AND WITH CONTRAST
TECHNIQUE: Multiplanar and multiecho pulse sequences of the thoracic and lumbar
spine were obtained without and with intravenous contrast.
CONTRAST:  10mL GADAVIST GADOBUTROL 1 MMOL/ML IV SOLN

[Series 40: T2 · sagittal · 3.0mm · 0.80mm/px · 2 of 17 slices shown (1 of 4)]
[im 1/17]
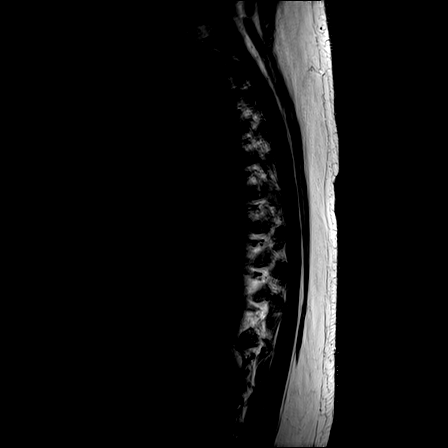
[im 17/17]
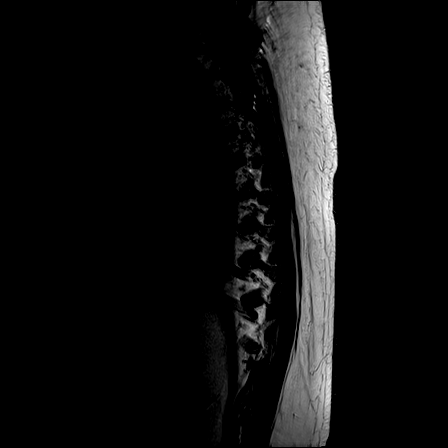

[Series 41: T1 · sagittal · 3.0mm · 0.80mm/px · 2 of 17 slices shown (1 of 4)]
[im 1/17]
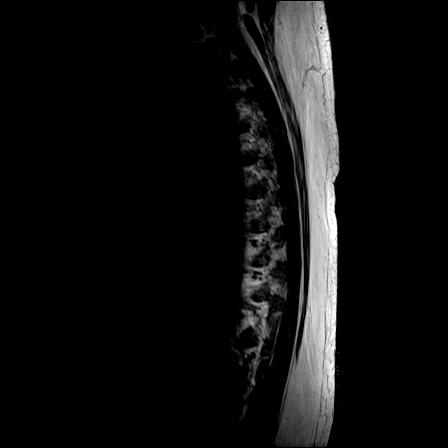
[im 17/17]
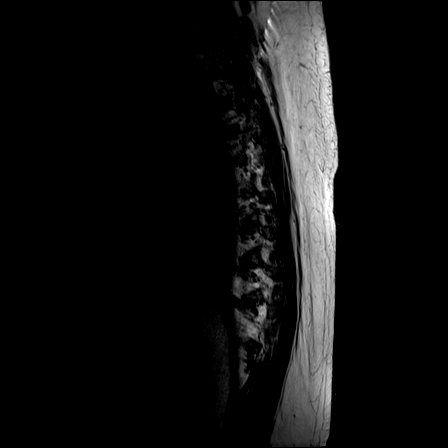

[Series 43: T2 · axial · 5.0mm · 0.59mm/px · z∈[-258,+6]mm · 5 of 39 slices shown (2 of 4)]
[im 1/39]
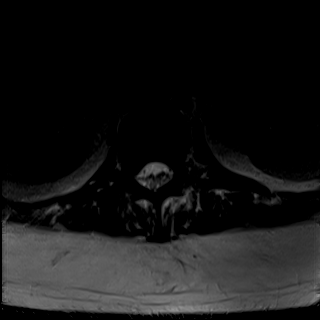
[im 10/39]
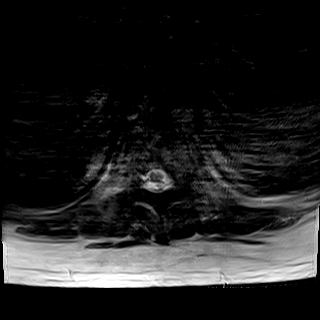
[im 20/39]
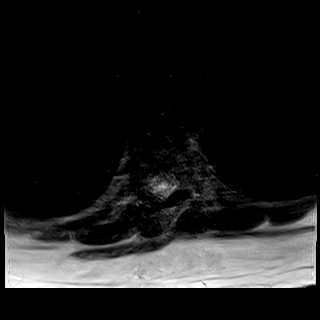
[im 29/39]
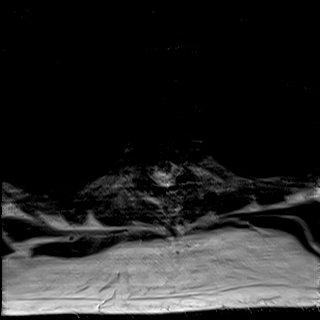
[im 39/39]
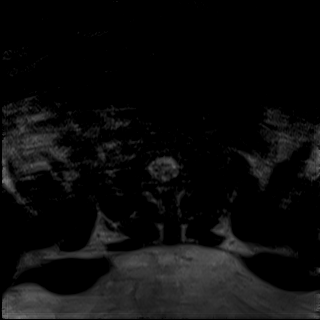

[Series 45: T2 · sagittal · 4.0mm · 0.73mm/px · 2 of 16 slices shown (3 of 4)]
[im 1/16]
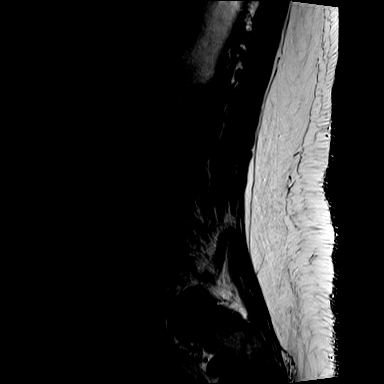
[im 16/16]
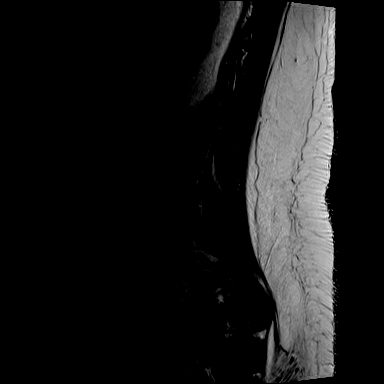

[Series 47: T1 · sagittal · 4.0mm · 0.88mm/px · 2 of 16 slices shown (2 of 4)]
[im 1/16]
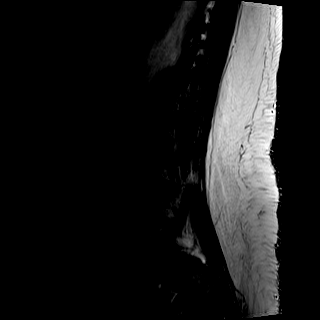
[im 16/16]
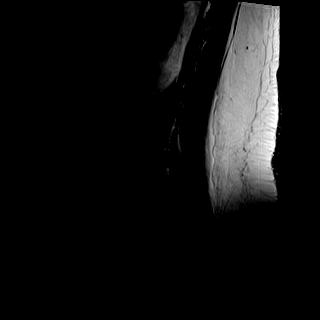

[Series 48: T2 · axial · 5.0mm · 0.57mm/px · z∈[-483,-292]mm · 3 of 26 slices shown (4 of 4)]
[im 1/26]
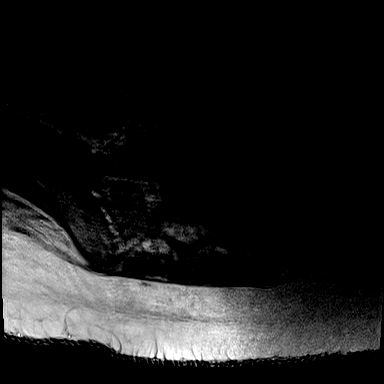
[im 13/26]
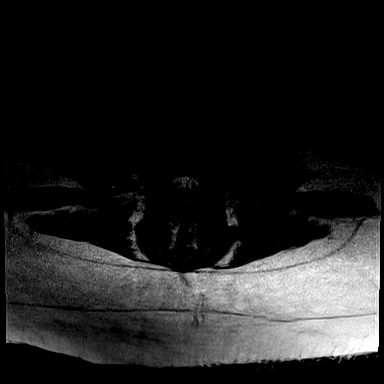
[im 26/26]
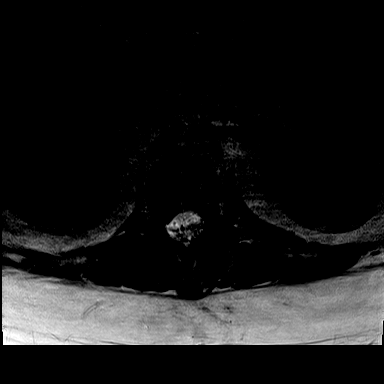

[Series 49: T1 · axial · 5.0mm · 0.34mm/px · z∈[-483,-292]mm · 3 of 26 slices shown (3 of 4)]
[im 1/26]
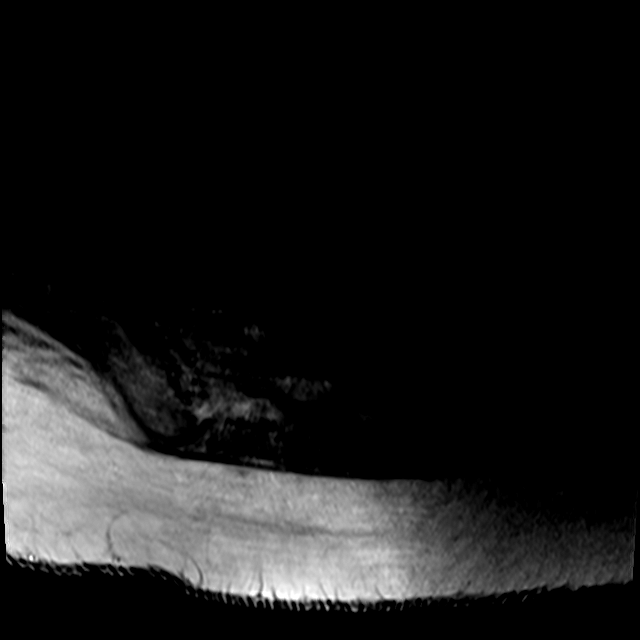
[im 13/26]
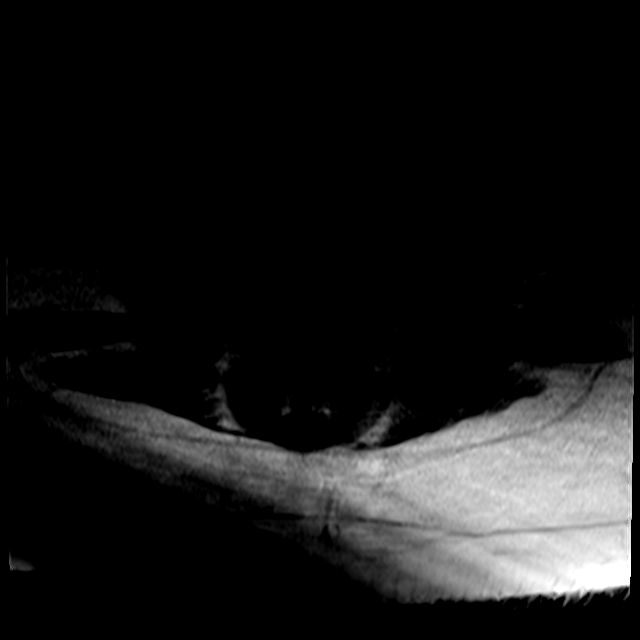
[im 26/26]
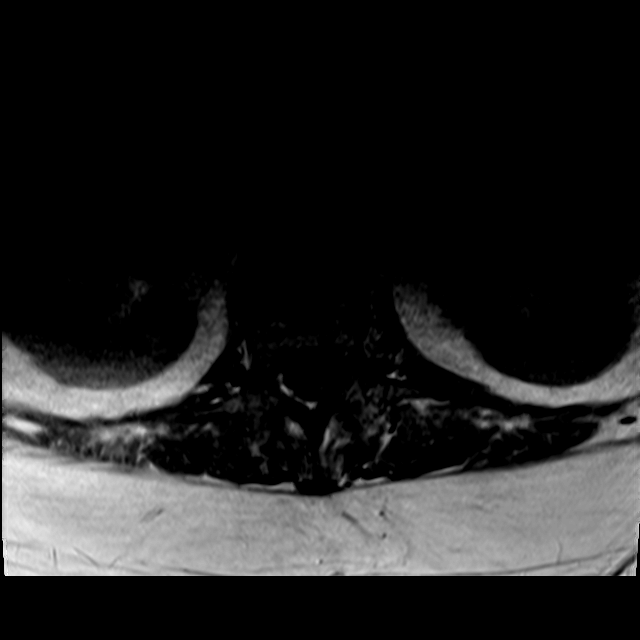

[Series 50: T1 · axial · non-contrast · 5.0mm · 0.31mm/px · z∈[-258,-49]mm · 4 of 39 slices shown (4 of 4)]
[im 1/39]
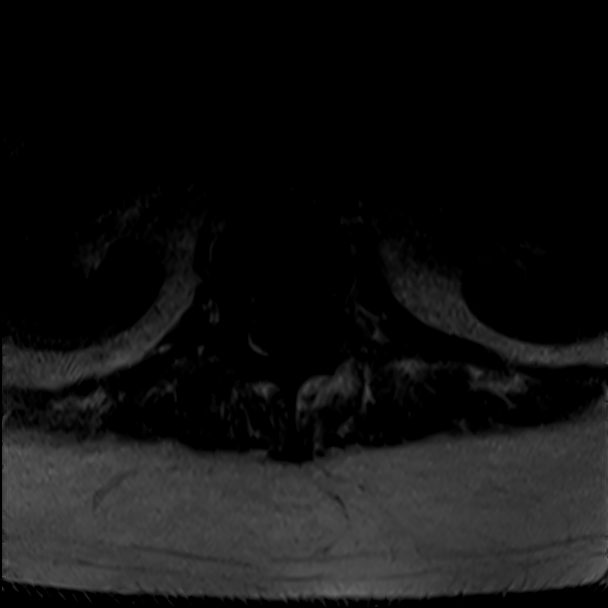
[im 10/39]
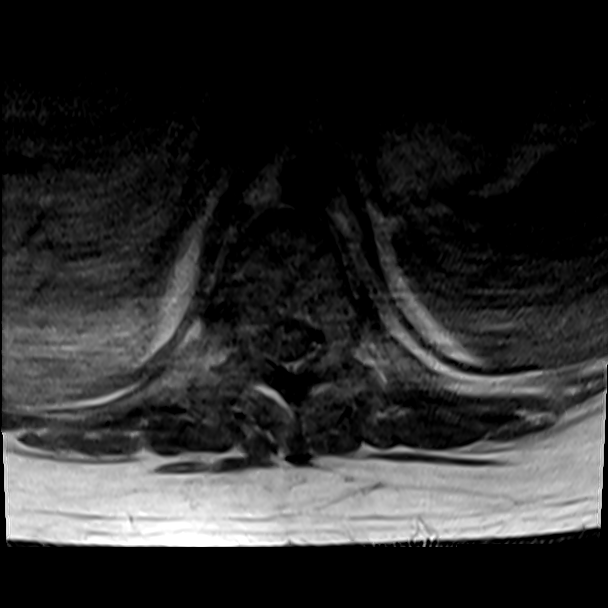
[im 20/39]
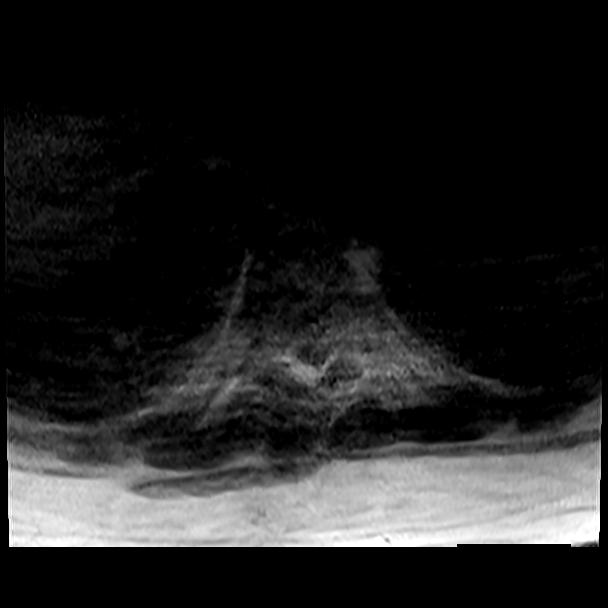
[im 29/39]
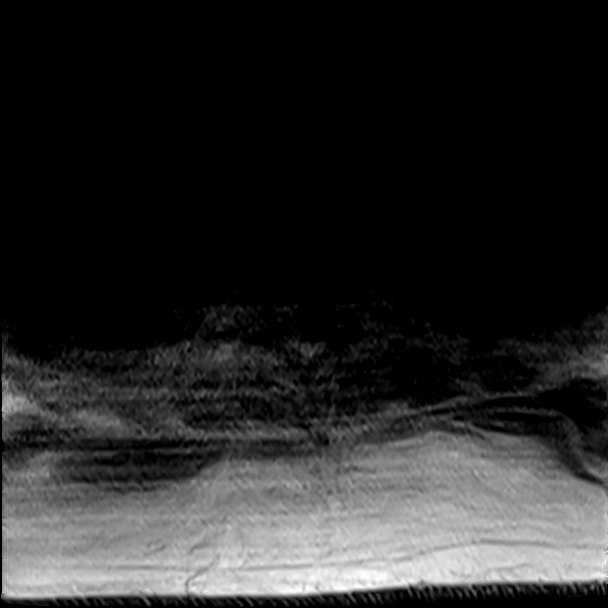

[23 of 48 positions shown; findings below may reference images not displayed]

FINDINGS: MRI THORACIC SPINE FINDINGS

Alignment:  Normal.

Image quality degraded by moderate to extensive motion.

Vertebrae: Negative for fracture or mass. Normal enhancement of the
spine.

Cord: Limited cord evaluation due to motion. No cord lesion or cord
compression identified.

Paraspinal and other soft tissues: Negative

Disc levels:

Negative for disc protrusion or spinal stenosis.

MRI LUMBAR SPINE FINDINGS

Segmentation:  Normal

Motion degraded study. There is progressive motion on the study
which becomes more severe on the axial images.

Alignment:  Normal

Vertebrae:  Negative for fracture or mass.

Conus medullaris and cauda equina: Conus extends to the T12-L1
level. Conus and cauda equina appear normal.

Paraspinal and other soft tissues: Negative for paraspinous mass or
adenopathy. Normal soft tissue enhancement.

Disc levels:

No significant abnormality at L3-4 or above

Mild disc bulging and mild facet degeneration L4-5 and L5-S1.
Negative for disc protrusion or stenosis
IMPRESSION: Motion degraded study, worse on the thoracic compared to the lumbar
spine.

No acute thoracic abnormality. Negative for fracture, infection, or
spinal stenosis.

No acute lumbar abnormality.

Mild disc and facet degeneration L4-5 and L5-S1 without stenosis.

## 2018-12-19 IMAGING — MR MR THORACIC SPINE WO/W CM
8 of 16 series · 23 of 48 positions shown · IV contrast (MH)
Comparison: CT thoracic lumbar [DATE]

CLINICAL DATA: Quadriplegia

EXAM:
MRI THORACIC AND LUMBAR SPINE WITHOUT AND WITH CONTRAST
TECHNIQUE: Multiplanar and multiecho pulse sequences of the thoracic and lumbar
spine were obtained without and with intravenous contrast.
CONTRAST:  10mL GADAVIST GADOBUTROL 1 MMOL/ML IV SOLN

[Series 40: T2 · sagittal · 3.0mm · 0.80mm/px · 2 of 17 slices shown (1 of 4)]
[im 1/17]
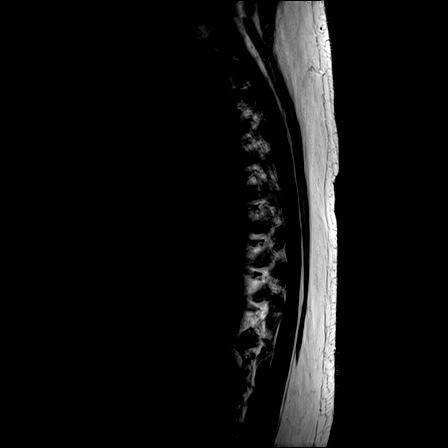
[im 17/17]
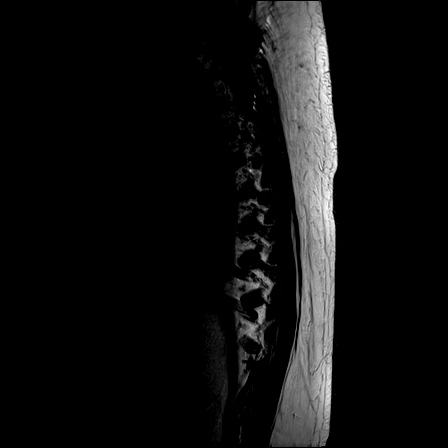

[Series 41: T1 · sagittal · 3.0mm · 0.80mm/px · 2 of 17 slices shown (1 of 4)]
[im 1/17]
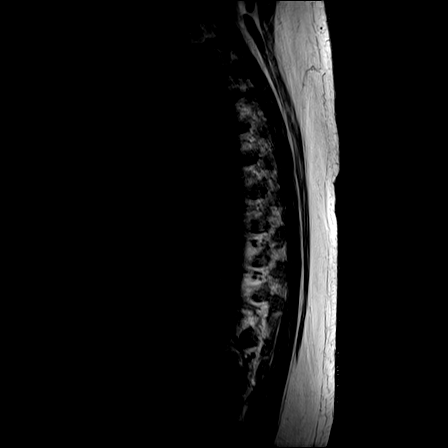
[im 17/17]
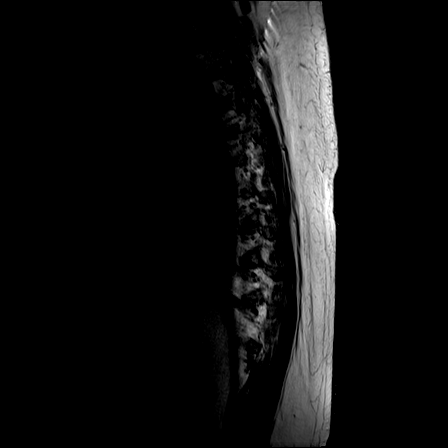

[Series 43: T2 · axial · 5.0mm · 0.59mm/px · z∈[-258,+6]mm · 5 of 39 slices shown (2 of 4)]
[im 1/39]
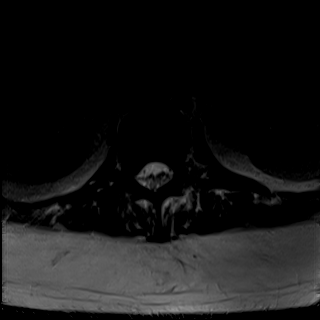
[im 10/39]
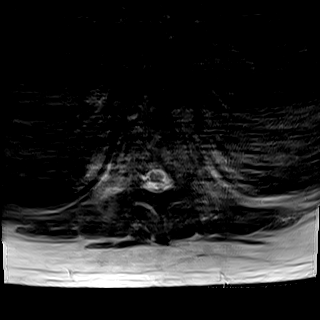
[im 20/39]
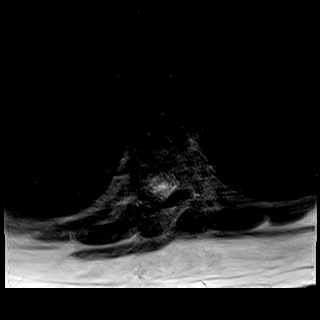
[im 29/39]
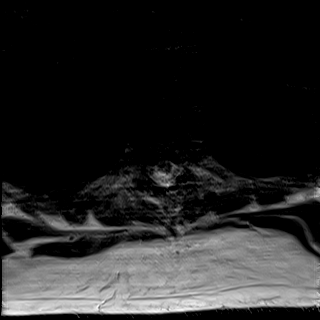
[im 39/39]
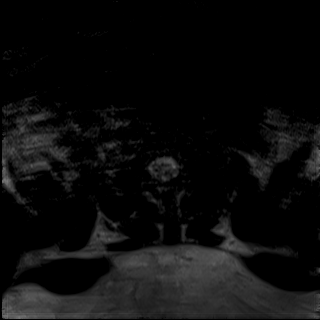

[Series 45: T2 · sagittal · 4.0mm · 0.73mm/px · 2 of 16 slices shown (3 of 4)]
[im 1/16]
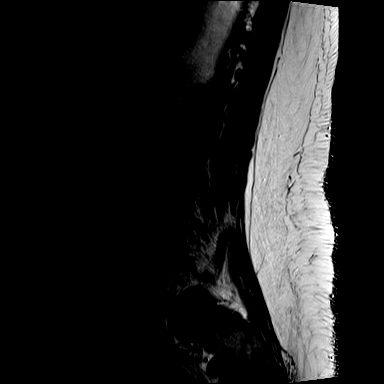
[im 16/16]
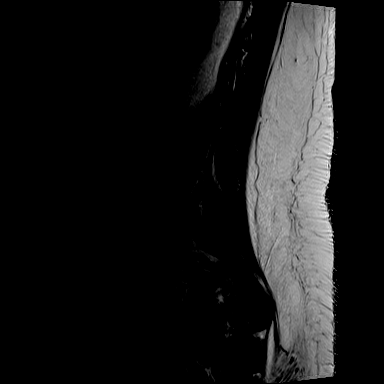

[Series 47: T1 · sagittal · 4.0mm · 0.88mm/px · 2 of 16 slices shown (2 of 4)]
[im 1/16]
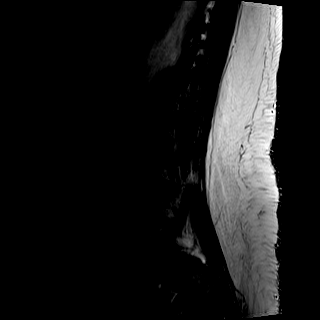
[im 16/16]
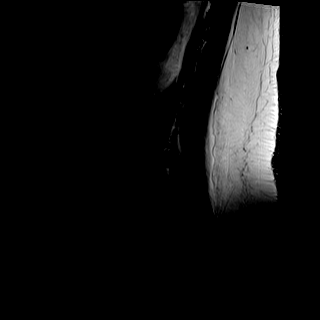

[Series 48: T2 · axial · 5.0mm · 0.57mm/px · z∈[-483,-292]mm · 3 of 26 slices shown (4 of 4)]
[im 1/26]
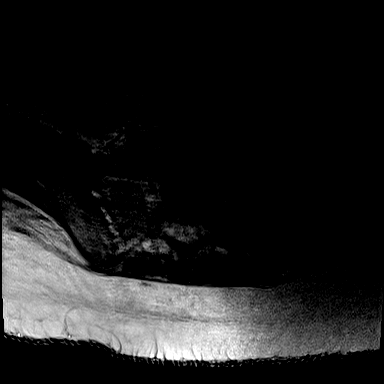
[im 13/26]
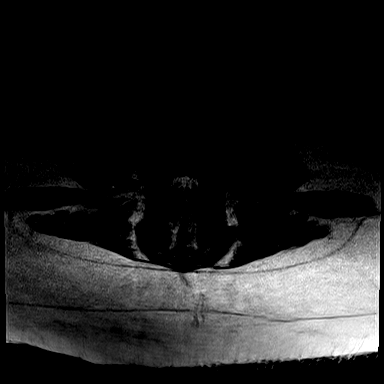
[im 26/26]
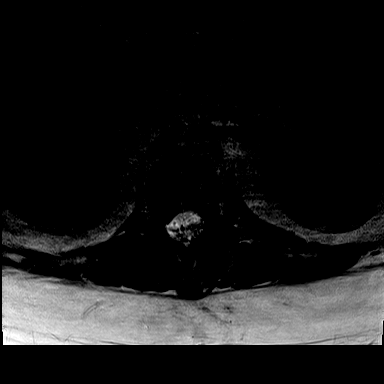

[Series 49: T1 · axial · 5.0mm · 0.34mm/px · z∈[-483,-292]mm · 3 of 26 slices shown (3 of 4)]
[im 1/26]
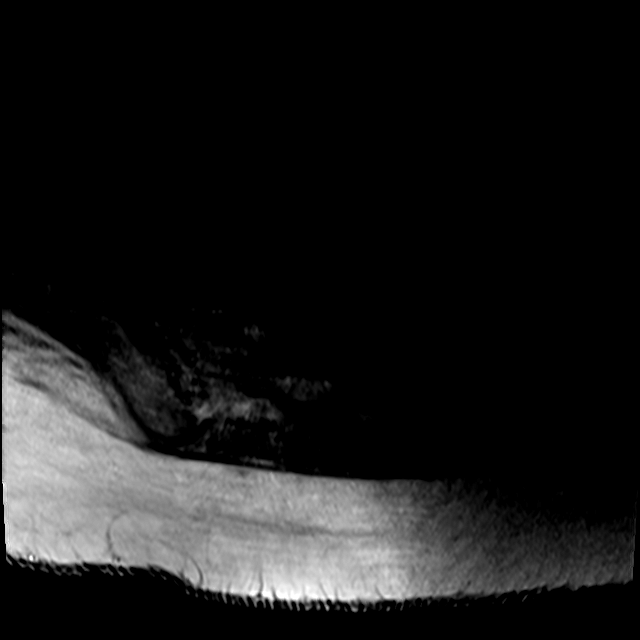
[im 13/26]
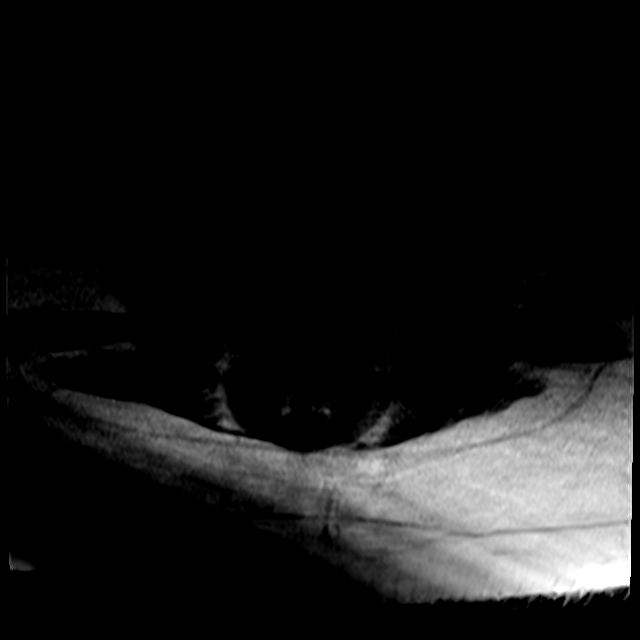
[im 26/26]
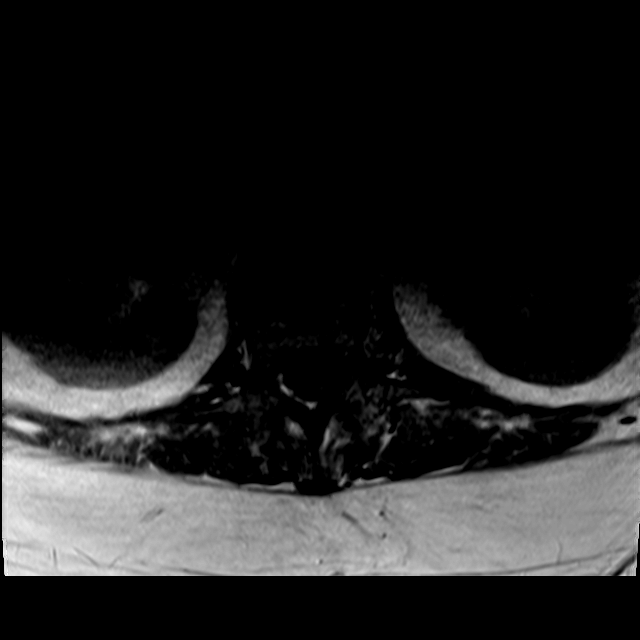

[Series 50: T1 · axial · non-contrast · 5.0mm · 0.31mm/px · z∈[-258,-49]mm · 4 of 39 slices shown (4 of 4)]
[im 1/39]
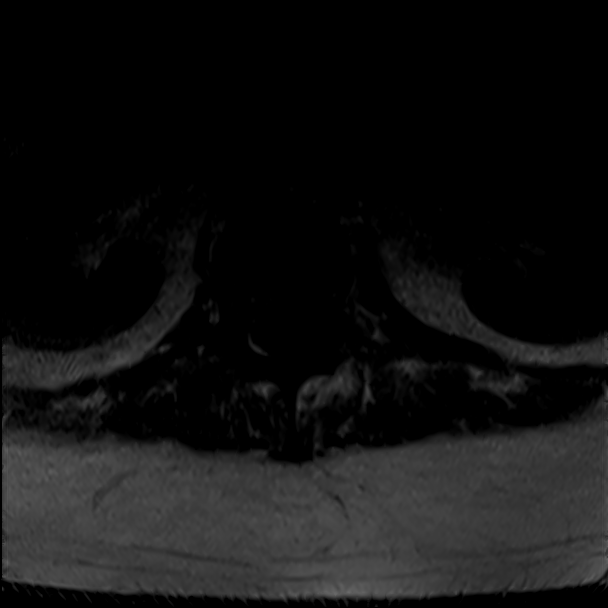
[im 10/39]
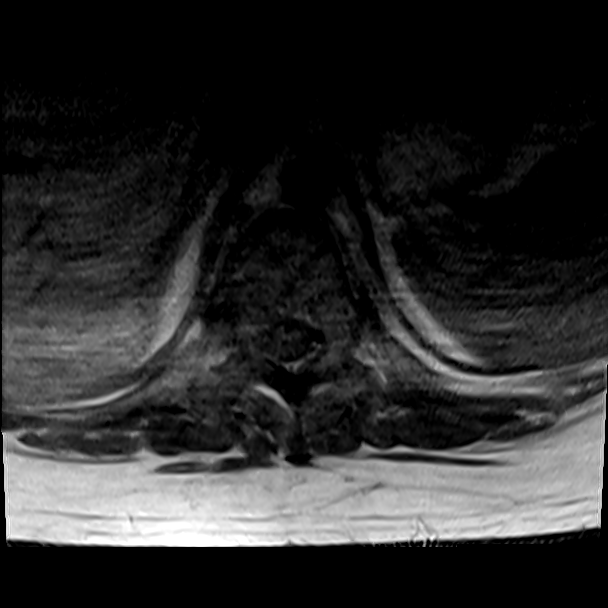
[im 20/39]
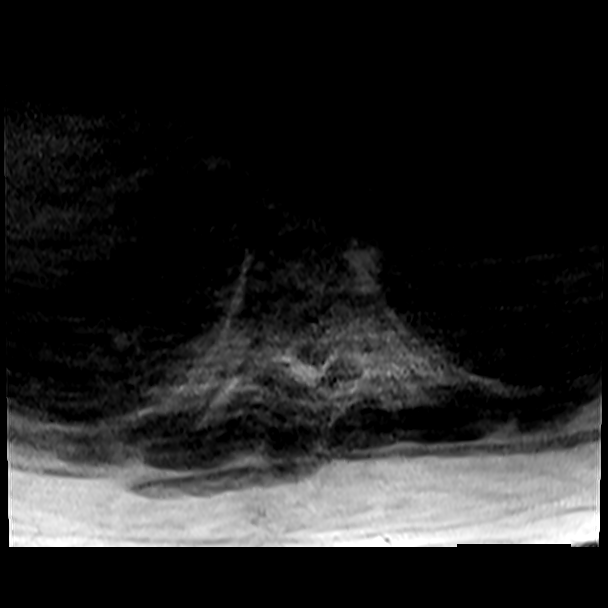
[im 29/39]
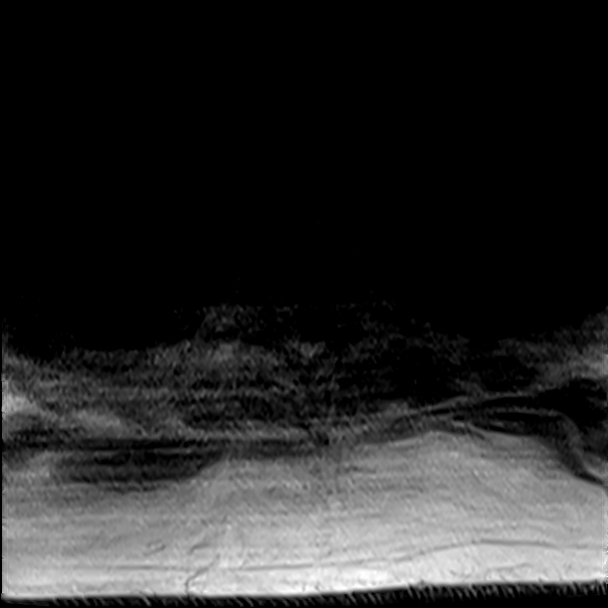

[23 of 48 positions shown; findings below may reference images not displayed]

FINDINGS: MRI THORACIC SPINE FINDINGS

Alignment:  Normal.

Image quality degraded by moderate to extensive motion.

Vertebrae: Negative for fracture or mass. Normal enhancement of the
spine.

Cord: Limited cord evaluation due to motion. No cord lesion or cord
compression identified.

Paraspinal and other soft tissues: Negative

Disc levels:

Negative for disc protrusion or spinal stenosis.

MRI LUMBAR SPINE FINDINGS

Segmentation:  Normal

Motion degraded study. There is progressive motion on the study
which becomes more severe on the axial images.

Alignment:  Normal

Vertebrae:  Negative for fracture or mass.

Conus medullaris and cauda equina: Conus extends to the T12-L1
level. Conus and cauda equina appear normal.

Paraspinal and other soft tissues: Negative for paraspinous mass or
adenopathy. Normal soft tissue enhancement.

Disc levels:

No significant abnormality at L3-4 or above

Mild disc bulging and mild facet degeneration L4-5 and L5-S1.
Negative for disc protrusion or stenosis
IMPRESSION: Motion degraded study, worse on the thoracic compared to the lumbar
spine.

No acute thoracic abnormality. Negative for fracture, infection, or
spinal stenosis.

No acute lumbar abnormality.

Mild disc and facet degeneration L4-5 and L5-S1 without stenosis.

## 2018-12-19 MED ORDER — ERYTHROMYCIN ETHYLSUCCINATE 400 MG/5ML PO SUSR
500.0000 mg | Freq: Three times a day (TID) | ORAL | Status: DC
  Administered 2018-12-19 – 2018-12-30 (×30): 500 mg
  Filled 2018-12-19 (×36): qty 6.3

## 2018-12-19 MED ORDER — GADOBUTROL 1 MMOL/ML IV SOLN
10.0000 mL | Freq: Once | INTRAVENOUS | Status: AC | PRN
  Administered 2018-12-19: 12:00:00 10 mL via INTRAVENOUS

## 2018-12-19 MED ORDER — LEVETIRACETAM 100 MG/ML PO SOLN
1000.0000 mg | Freq: Two times a day (BID) | ORAL | Status: DC
  Administered 2018-12-19 – 2018-12-22 (×7): 1000 mg
  Filled 2018-12-19 (×7): qty 10

## 2018-12-19 NOTE — Progress Notes (Signed)
Report received from [facility] [place] (ICU).  Patient received from 58M with his belongings and attended by his mother.  Patient is nonverbal (baseline) and in NAD.  Patient skin is intact.  Vital signs stable.

## 2018-12-19 NOTE — Progress Notes (Signed)
This AM the feeding tube was running at 20 ml/hr.At 07:56 the rate was increased at 30 ml/hr per order. Will continue to monitor.

## 2018-12-19 NOTE — Progress Notes (Signed)
Patient am Vitals done at 0520.  Patient had MEWS of 3.  Temp of 100.3, HR 123. Hospitalist paged.  Per hospitalist gave prn tylenol by NG.  Hold prn IV metop until HR > 130.  Cycling b/p q30 min.

## 2018-12-19 NOTE — Progress Notes (Signed)
Patient continue to be tachy with HR in high 110. No acute changes in patient's condition. MD aware. Patient's mom at bedside all the time. Will continue to monitor.

## 2018-12-19 NOTE — Progress Notes (Signed)
Subjective: Maybe slight improvement at the hip.   Exam: Vitals:   12/19/18 1300 12/19/18 1332  BP:    Pulse: (!) 126 (!) 116  Resp: (!) 22   Temp:    SpO2: 99% 100%   Gen: In bed, NAD Resp: non-labored breathing, no acute distress Abd: soft, nt  Neuro: MS: Eyes open, nonverbal at baseline, does not follow commands CN: Pupils equal round reactive, eyes cross midline in both directions, blinks to threat Motor: He moves his bilateral arms against gravity, but with distal weakness in the hands, in the legs, he has minimal proximal flexion to noxious stimulation bilaterally.  Sensory: He grimaces to noxious stimulation of the lower extremities DTR: Areflexic throughout  Pertinent Labs: CSF -0 WBC -550 RBC -Protein 172 -Glucose 60  Zinc- normal at 120 Serum copper- normal at 122 B12 low at 159 B1-pending  Impression: 23 year old male with autism and seizure disorder who presents with an acute to subacute neuropathy over the past 6 weeks. On further discussion with his mother, the distal weakness began FIRST, follwoed by the nausea and vomiting. This, coupled with the elevated CSF protein, makes GBS the more likely cuprit, but his B1 was low at 28 on admission as well, keeping this in the differential.   He has finished 3 days of high-dose IV thiamine and today is day 5 of IVIG. I do not think, especially this far into symptoms, that further immune treatment beyond IVIG would be indicated. Treatment moving forward will be primarily be physical therapy.   Recommendations: 1) continue thiamine supplementation 2) multivitamin 3) B12 injection, 1000 mcg day 2/3 4) IVIG day 5/5 5) neurology will follow  Roland Rack, MD [facility] [phone]  If 7pm- 7am, please page neurology on call as listed in [place].

## 2018-12-19 NOTE — Progress Notes (Signed)
PROGRESS NOTE    Aaron Vega  ZOX:[id number] DOB: 08/21/1995 DOA: 12/18/2018 [facility]     Brief Narrative:  Aaron CapCameron Winnett is a 23 yo male with past medical history significant for severe autism with seizures, nonverbal at baseline who presented with declining health.  He suffered a severe seizure at home and struck his head in August 2020 and has been worsening in mentation since.  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  He presented to the emergency department for increasing lethargy, vomiting and decreased urine output.  He had been previously enrolled in hospice at home, but mother has now revoked hospice.   New events last 24 hours / Subjective: Per mom at bedside, no acute events overnight.  He seems to be about the same.  Assessment & Plan:   Principal Problem:   Severe sepsis (HCC) Active Problems:   Autistic disorder   Intractable nausea and vomiting   Seizure disorder (HCC)   Constipation in male   Obesity, Class III, BMI 40-49.9 (morbid obesity) (HCC)   Ileus (HCC)   Aspiration pneumonia (HCC)   Community acquired pneumonia of left lower lobe of lung   Goals of care, counseling/discussion   Palliative care by specialist   DNR (do not resuscitate) discussion   Protein-calorie malnutrition, severe    Acute on chronic respiratory failure Obstructive sleep apnea history, not tolerant of positive pressure ventilation Sepsis secondary to aspiration pneumonia -S/p Cefepime, flagyl x 3 days -Continue aspiration precautions  Acute metabolic encephalopathy, cause unclear likely multifactorial Wernicke's encephalopathy secondary to thiamine deficiency Seizure disorder  -Neurology following -Vit B1 28.2. Vit B12 159. Continue thiamine, B12, MV  -Continue Keppra, Onfi   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 Barre -IVIG, end date 11/25, today is day #5 of 5  Ileus -Continue  erythromycin  Hypothyroidism -Continue synthroid   HTN -Continue lisinopril, metoprolol   Elevated liver enzymes -Trend LFT     DVT prophylaxis: Subcutaneous heparin Code Status: Full code Family Communication: Mother at bedside Disposition Plan: Continue tube feedings for now.  IVIG day #5 today   Consultants:   PCCM  Neurology  Palliative care   Antimicrobials:  Anti-infectives (From admission, onward)   Start     Dose/Rate Route Frequency Ordered Stop   12/19/18 1400  erythromycin (EES) 400 MG/5ML suspension 500 mg     500 mg Per Tube Every 8 hours 12/19/18 1132     12/18/18 1500  erythromycin 500 mg in sodium chloride 0.9 % 100 mL IVPB  Status:  Discontinued     500 mg 100 mL/hr over 60 Minutes Intravenous Every 8 hours 12/18/18 1449 12/19/18 1132   12/15/18 1400  erythromycin (EES) 400 MG/5ML suspension 400 mg  Status:  Discontinued     400 mg Per Tube Every 8 hours 12/15/18 1012 12/15/18 1130   12/15/18 1400  erythromycin 500 mg in sodium chloride 0.9 % 100 mL IVPB  Status:  Discontinued     500 mg 100 mL/hr over 60 Minutes Intravenous Every 8 hours 12/15/18 1130 12/18/18 1449   12/09/18 0600  vancomycin (VANCOCIN) 1,250 mg in sodium chloride 0.9 % 250 mL IVPB  Status:  Discontinued     1,250 mg 166.7 mL/hr over 90 Minutes Intravenous Every 12 hours 11/30/2018 1539 12/10/18 1043   12/09/18 0000  ceFEPIme (MAXIPIME) 2 g in sodium chloride 0.9 % 100 mL IVPB  Status:  Discontinued     2 g 200 mL/hr over 30  Minutes Intravenous Every 8 hours 11/30/2018 1539 12/11/18 1801   12/09/18 0000  metroNIDAZOLE (FLAGYL) IVPB 500 mg  Status:  Discontinued     500 mg 100 mL/hr over 60 Minutes Intravenous Every 8 hours 12/12/2018 2146 12/11/18 1801   12/24/2018 1415  ceFEPIme (MAXIPIME) 2 g in sodium chloride 0.9 % 100 mL IVPB     2 g 200 mL/hr over 30 Minutes Intravenous  Once 11/28/2018 1411 12/01/2018 1538   12/19/2018 1415  metroNIDAZOLE (FLAGYL) IVPB 500 mg     500 mg 100 mL/hr  over 60 Minutes Intravenous  Once 12/05/2018 1411 12/06/2018 1609   12/03/2018 1415  vancomycin (VANCOCIN) IVPB 1000 mg/200 mL premix  Status:  Discontinued     1,000 mg 200 mL/hr over 60 Minutes Intravenous  Once 12/11/2018 1411 12/16/2018 1414   12/10/2018 1415  vancomycin (VANCOCIN) 2,500 mg in sodium chloride 0.9 % 500 mL IVPB     2,500 mg 250 mL/hr over 120 Minutes Intravenous  Once 12/18/2018 1414 12/10/2018 1710        Objective: Vitals:   12/19/18 0735 12/19/18 0808 12/19/18 0836 12/19/18 0857  BP:  (!) 129/93 115/79   Pulse:  (!) 131 (!) 132 (!) 113  Resp:  17 (!) 22 (!) 21  Temp: 98.8 F (37.1 C) 97.9 F (36.6 C)    TempSrc:  Oral    SpO2:  99% 100% 100%  Weight:      Height:        Intake/Output Summary (Last 24 hours) at 12/19/2018 1227 Last data filed at 12/19/2018 0700 Gross per 24 hour  Intake 1163.03 ml  Output 300 ml  Net 863.03 ml   Filed Weights   12/15/18 0404 12/17/18 0500 12/18/18 0500  Weight: (!) 145.7 kg (!) 136.3 kg (!) 136.9 kg    Examination:  General exam: Appears calm and comfortable  Respiratory system: Clear to auscultation. Respiratory effort normal. No respiratory distress.  Cardiovascular system: S1 & S2 heard, tachycardic, regular rhythm. No murmurs. No pedal edema. Gastrointestinal system: Abdomen is nondistended, soft and nontender. Normal bowel sounds heard. Central nervous system: Alert  Extremities: Symmetric in appearance  Skin: No rashes, lesions or ulcers on exposed skin   Data Reviewed: I have personally reviewed following labs and imaging studies  CBC: Recent Labs  Lab 12/18/18 0159  WBC 7.0  NEUTROABS 3.8  HGB 9.9*  HCT 30.1*  MCV 87.0  PLT 383   Basic Metabolic Panel: Recent Labs  Lab 12/12/18 1648 12/13/18 0633 12/15/18 0436 12/18/18 0159 12/19/18 0857  NA  --  138 138 137 137  K  --  4.4 5.3* 3.4* 3.6  CL  --  98 100 100 101  CO2  --  29 24 26 26   GLUCOSE  --  112* 79 99 99  BUN  --  5* <5* <5* 6  CREATININE   --  0.47* 0.43* 0.42* 0.39*  CALCIUM  --  8.5* 9.0 9.0 9.4  MG 1.5* 2.2 1.8 1.6* 1.7  PHOS 3.1 3.6 3.5  --   --    GFR: Estimated Creatinine Clearance: 206.4 mL/min (A) (by C-G formula based on SCr of 0.39 mg/dL (L)). Liver Function Tests: Recent Labs  Lab 12/14/18 1729 12/18/18 0159  AST  --  117*  ALT  --  134*  ALKPHOS  --  33*  BILITOT  --  1.1  PROT  --  8.2*  ALBUMIN 3.3* 2.6*   No results for input(s): LIPASE, AMYLASE  in the last 168 hours. No results for input(s): AMMONIA in the last 168 hours. Coagulation Profile: Recent Labs  Lab 12/13/18 1530 12/14/18 0841  INR 1.1 1.0   Cardiac Enzymes: No results for input(s): CKTOTAL, CKMB, CKMBINDEX, TROPONINI in the last 168 hours. BNP (last 3 results) No results for input(s): PROBNP in the last 8760 hours. HbA1C: No results for input(s): HGBA1C in the last 72 hours. CBG: Recent Labs  Lab 12/18/18 1954 12/19/18 0027 12/19/18 0417 12/19/18 0806 12/19/18 1156  GLUCAP 95 97 93 98 86   Lipid Profile: No results for input(s): CHOL, HDL, LDLCALC, TRIG, CHOLHDL, LDLDIRECT in the last 72 hours. Thyroid Function Tests: No results for input(s): TSH, T4TOTAL, FREET4, T3FREE, THYROIDAB in the last 72 hours. Anemia Panel: No results for input(s): VITAMINB12, FOLATE, FERRITIN, TIBC, IRON, RETICCTPCT in the last 72 hours. Sepsis Labs: No results for input(s): PROCALCITON, LATICACIDVEN in the last 168 hours.  Recent Results (from the past 240 hour(s))  MRSA PCR Screening     Status: None   Collection Time: 12/10/18  2:36 PM   Specimen: Nasal Mucosa; Nasopharyngeal  Result Value Ref Range Status   MRSA by PCR NEGATIVE NEGATIVE Final    Comment:        The GeneXpert MRSA Assay (FDA approved for NASAL specimens only), is one component of a comprehensive MRSA colonization surveillance program. It is not intended to diagnose MRSA infection nor to guide or monitor treatment for MRSA infections. Performed at [facility], [place] [address], [place], [place] [address]       Radiology Studies: Mr Thoracic Spine W Wo Contrast  Result Date: 12/19/2018 CLINICAL DATA:  Quadriplegia EXAM: MRI THORACIC AND LUMBAR SPINE WITHOUT AND WITH CONTRAST TECHNIQUE: Multiplanar and multiecho pulse sequences of the thoracic and lumbar spine were obtained without and with intravenous contrast. CONTRAST:  71mL GADAVIST GADOBUTROL 1 MMOL/ML IV SOLN COMPARISON:  CT thoracic lumbar 12/14/2018 FINDINGS: MRI THORACIC SPINE FINDINGS Alignment:  Normal. Image quality degraded by moderate to extensive motion. Vertebrae: Negative for fracture or mass. Normal enhancement of the spine. Cord: Limited cord evaluation due to motion. No cord lesion or cord compression identified. Paraspinal and other soft tissues: Negative Disc levels: Negative for disc protrusion or spinal stenosis. MRI LUMBAR SPINE FINDINGS Segmentation:  Normal Motion degraded study. There is progressive motion on the study which becomes more severe on the axial images. Alignment:  Normal Vertebrae:  Negative for fracture or mass. Conus medullaris and cauda equina: Conus extends to the T12-L1 level. Conus and cauda equina appear normal. Paraspinal and other soft tissues: Negative for paraspinous mass or adenopathy. Normal soft tissue enhancement. Disc levels: No significant abnormality at L3-4 or above Mild disc bulging and mild facet degeneration L4-5 and L5-S1. Negative for disc protrusion or stenosis IMPRESSION: Motion degraded study, worse on the thoracic compared to the lumbar spine. No acute thoracic abnormality. Negative for fracture, infection, or spinal stenosis. No acute lumbar abnormality. Mild disc and facet degeneration L4-5 and L5-S1 without stenosis. Electronically Signed   By: Franchot Gallo M.D.   On: 12/19/2018 12:05   Mr Lumbar Spine W Wo Contrast  Result Date: 12/19/2018 CLINICAL DATA:  Quadriplegia EXAM: MRI THORACIC AND LUMBAR SPINE WITHOUT AND WITH CONTRAST  TECHNIQUE: Multiplanar and multiecho pulse sequences of the thoracic and lumbar spine were obtained without and with intravenous contrast. CONTRAST:  28mL GADAVIST GADOBUTROL 1 MMOL/ML IV SOLN COMPARISON:  CT thoracic lumbar 12/14/2018 FINDINGS: MRI THORACIC SPINE FINDINGS Alignment:  Normal. Image quality degraded by moderate to extensive motion. Vertebrae: Negative for fracture or mass. Normal enhancement of the spine. Cord: Limited cord evaluation due to motion. No cord lesion or cord compression identified. Paraspinal and other soft tissues: Negative Disc levels: Negative for disc protrusion or spinal stenosis. MRI LUMBAR SPINE FINDINGS Segmentation:  Normal Motion degraded study. There is progressive motion on the study which becomes more severe on the axial images. Alignment:  Normal Vertebrae:  Negative for fracture or mass. Conus medullaris and cauda equina: Conus extends to the T12-L1 level. Conus and cauda equina appear normal. Paraspinal and other soft tissues: Negative for paraspinous mass or adenopathy. Normal soft tissue enhancement. Disc levels: No significant abnormality at L3-4 or above Mild disc bulging and mild facet degeneration L4-5 and L5-S1. Negative for disc protrusion or stenosis IMPRESSION: Motion degraded study, worse on the thoracic compared to the lumbar spine. No acute thoracic abnormality. Negative for fracture, infection, or spinal stenosis. No acute lumbar abnormality. Mild disc and facet degeneration L4-5 and L5-S1 without stenosis. Electronically Signed   By: Marlan Palau M.D.   On: 12/19/2018 12:05      Scheduled Meds: . chlorhexidine  15 mL Mouth Rinse BID  . Chlorhexidine Gluconate Cloth  6 each Topical Q0600  . cloBAZam  60 mg Per Tube BID  . erythromycin  500 mg Per Tube Q8H  . feeding supplement (VITAL 1.5 CAL)  1,000 mL Per Tube Q24H  . heparin injection (subcutaneous)  5,000 Units Subcutaneous Q8H  . levETIRAcetam  1,000 mg Per Tube BID  . levothyroxine  25  mcg Per Tube Q0600  . lisinopril  20 mg Per NG tube Daily  . magnesium oxide  400 mg Per NG tube BID  . mouth rinse  15 mL Mouth Rinse q12n4p  . metoprolol tartrate  50 mg Per NG tube BID  . multivitamin  15 mL Per Tube Daily  . nystatin   Topical Daily  . pantoprazole sodium  40 mg Per Tube BID  . polyethylene glycol  17 g Per Tube BID  . sennosides  5 mL Per Tube BID  . sodium chloride flush  10-40 mL Intracatheter Q12H  . sodium chloride flush  3 mL Intravenous Q12H  . thiamine  100 mg Per Tube Daily   Continuous Infusions: . sodium chloride Stopped (12/16/18 2230)  . dextrose 5% lactated ringers Stopped (12/17/18 1532)  . Immune Globulin 10%       LOS: 11 days      Time spent: 35 minutes   Noralee Stain, DO [facility] 12/19/2018, 12:27 PM   Available via Epic secure chat 7am-7pm After these hours, please refer to coverage provider listed on [email or web address]

## 2018-12-19 NOTE — Progress Notes (Signed)
MEWS score yellow; this is not an acute change in patient's condition. MD aware. Per PRN order patient received Metoprolol 5 mg IV for HR > 130. Will continue to monitor.

## 2018-12-19 NOTE — Plan of Care (Signed)
  Problem: Education: Goal: Knowledge of General Education information will improve Description Including pain rating scale, medication(s)/side effects and non-pharmacologic comfort measures Outcome: Progressing   

## 2018-12-20 ENCOUNTER — Inpatient Hospital Stay (HOSPITAL_COMMUNITY)

## 2018-12-20 DIAGNOSIS — R29898 Other symptoms and signs involving the musculoskeletal system: Secondary | ICD-10-CM

## 2018-12-20 DIAGNOSIS — R609 Edema, unspecified: Secondary | ICD-10-CM

## 2018-12-20 LAB — COMPREHENSIVE METABOLIC PANEL
ALT: 102 U/L — ABNORMAL HIGH (ref 0–44)
AST: 81 U/L — ABNORMAL HIGH (ref 15–41)
Albumin: 2.5 g/dL — ABNORMAL LOW (ref 3.5–5.0)
Alkaline Phosphatase: 30 U/L — ABNORMAL LOW (ref 38–126)
Anion gap: 9 (ref 5–15)
BUN: 6 mg/dL (ref 6–20)
CO2: 26 mmol/L (ref 22–32)
Calcium: 9.4 mg/dL (ref 8.9–10.3)
Chloride: 103 mmol/L (ref 98–111)
Creatinine, Ser: 0.34 mg/dL — ABNORMAL LOW (ref 0.61–1.24)
GFR calc Af Amer: >60 mL/min (ref >60)
GFR calc non Af Amer: >60 mL/min (ref >60)
Glucose, Bld: 100 mg/dL — ABNORMAL HIGH (ref 70–99)
Potassium: 3.9 mmol/L (ref 3.5–5.1)
Sodium: 138 mmol/L (ref 135–145)
Total Bilirubin: 1.2 mg/dL (ref 0.3–1.2)
Total Protein: 9.4 g/dL — ABNORMAL HIGH (ref 6.5–8.1)

## 2018-12-20 LAB — GLUCOSE, CAPILLARY
Glucose-Capillary: 101 mg/dL — ABNORMAL HIGH (ref 70–99)
Glucose-Capillary: 102 mg/dL — ABNORMAL HIGH (ref 70–99)
Glucose-Capillary: 102 mg/dL — ABNORMAL HIGH (ref 70–99)
Glucose-Capillary: 104 mg/dL — ABNORMAL HIGH (ref 70–99)
Glucose-Capillary: 105 mg/dL — ABNORMAL HIGH (ref 70–99)
Glucose-Capillary: 96 mg/dL (ref 70–99)
Glucose-Capillary: 99 mg/dL (ref 70–99)

## 2018-12-20 LAB — URINALYSIS, ROUTINE W REFLEX MICROSCOPIC
Glucose, UA: NEGATIVE mg/dL
Hgb urine dipstick: NEGATIVE
Ketones, ur: NEGATIVE mg/dL
Leukocytes,Ua: NEGATIVE
Nitrite: NEGATIVE
Protein, ur: 30 mg/dL — AB
Specific Gravity, Urine: 1.034 — ABNORMAL HIGH (ref 1.005–1.030)
pH: 5 (ref 5.0–8.0)

## 2018-12-20 LAB — CBC
HCT: 31.3 % — ABNORMAL LOW (ref 39.0–52.0)
Hemoglobin: 9.8 g/dL — ABNORMAL LOW (ref 13.0–17.0)
MCH: 28.4 pg (ref 26.0–34.0)
MCHC: 31.3 g/dL (ref 30.0–36.0)
MCV: 90.7 fL (ref 80.0–100.0)
Platelets: 440 10*3/uL — ABNORMAL HIGH (ref 150–400)
RBC: 3.45 MIL/uL — ABNORMAL LOW (ref 4.22–5.81)
RDW: 14.1 % (ref 11.5–15.5)
WBC: 6 10*3/uL (ref 4.0–10.5)
nRBC: 0 % (ref 0.0–0.2)

## 2018-12-20 LAB — MAGNESIUM: Magnesium: 1.8 mg/dL (ref 1.7–2.4)

## 2018-12-20 IMAGING — DX DG ABD PORTABLE 1V
1 series · 1 of 1 positions shown · non-contrast
Comparison: Radiograph [DATE]

CLINICAL DATA: NG placement

EXAM:
PORTABLE ABDOMEN - 1 VIEW

[abdomen kub]
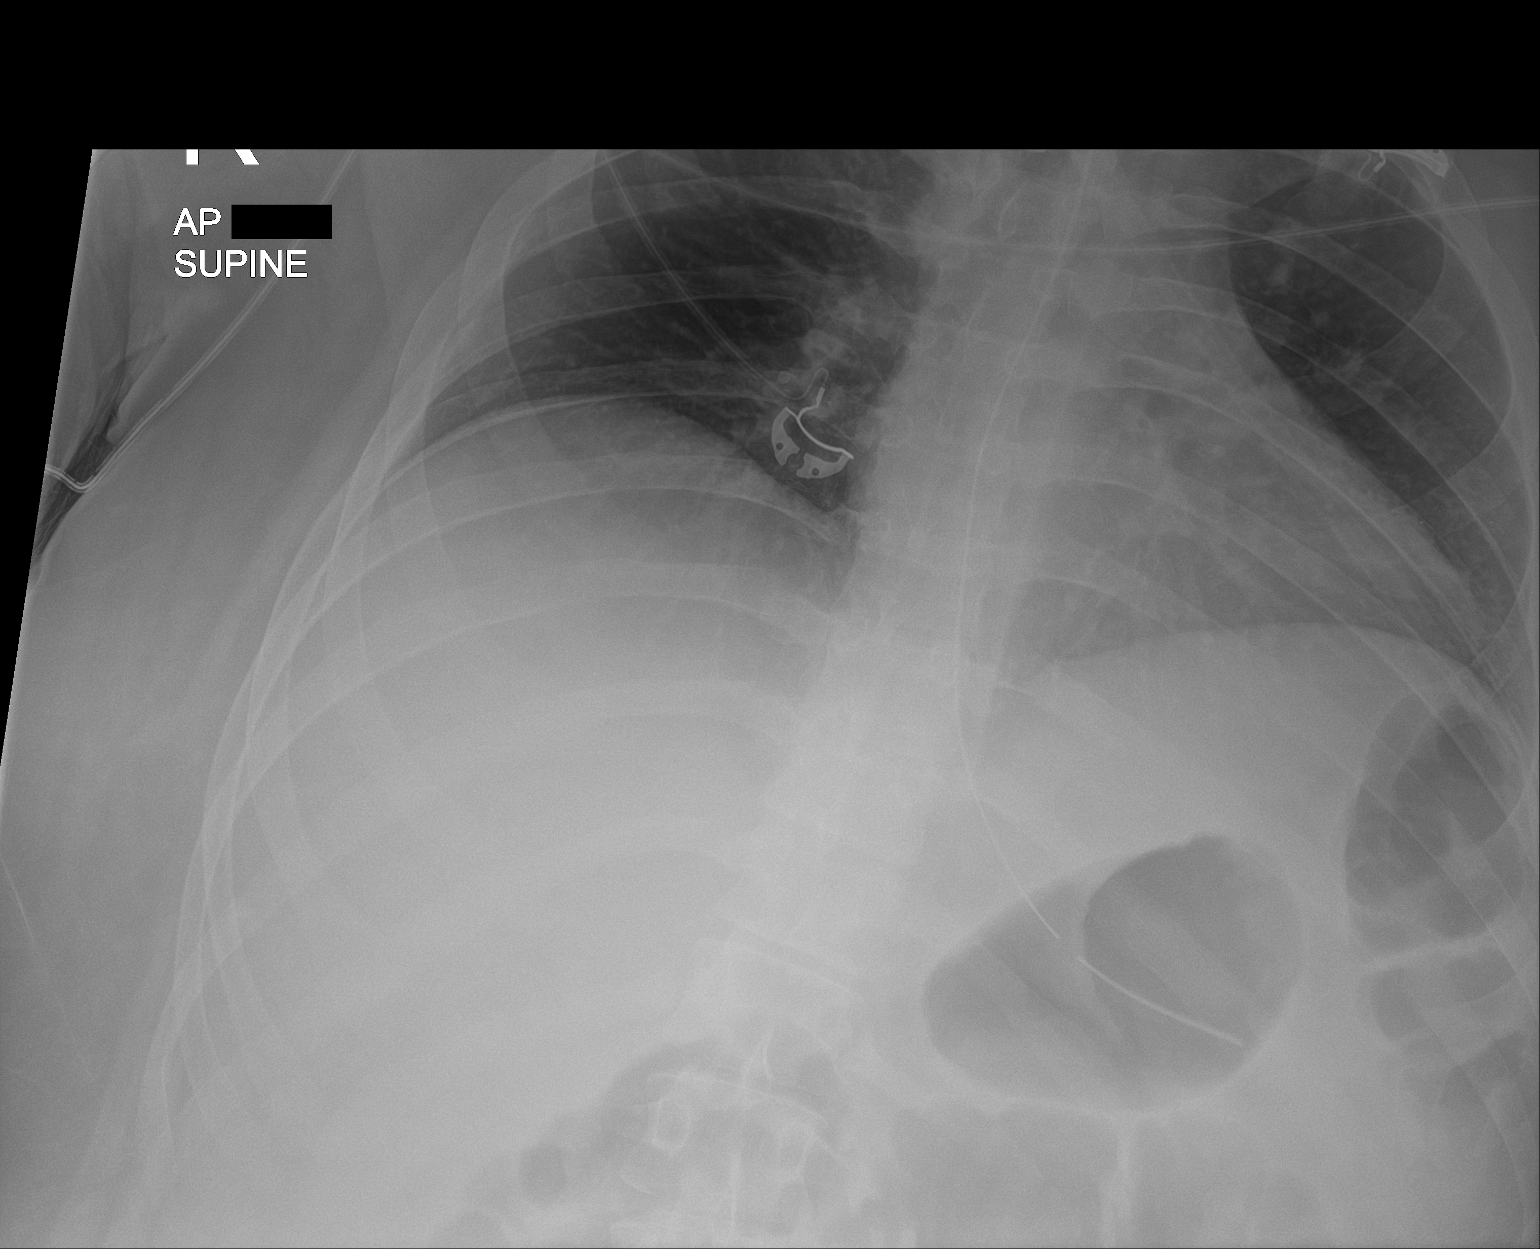

[1 of 1 positions shown; findings below may reference images not displayed]

FINDINGS: Transesophageal tube tip and side port distal to the GE junction,
curling in the left upper quadrant in the region of the gastric
body. Atelectatic changes in the otherwise clear lung bases. Gaseous
distention of what appears to be the colon without high-grade bowel
obstruction.
IMPRESSION: 1. Transesophageal tube tip and side port in the region of the
gastric body.
2. Gaseous distention of the colon without high-grade bowel
obstruction.

## 2018-12-20 IMAGING — DX DG ABD PORTABLE 1V
1 series · 1 of 1 positions shown · non-contrast
Comparison: [DATE] abdominal radiograph

CLINICAL DATA: NG tube placement

EXAM:
PORTABLE ABDOMEN - 1 VIEW

[abdomen kub]
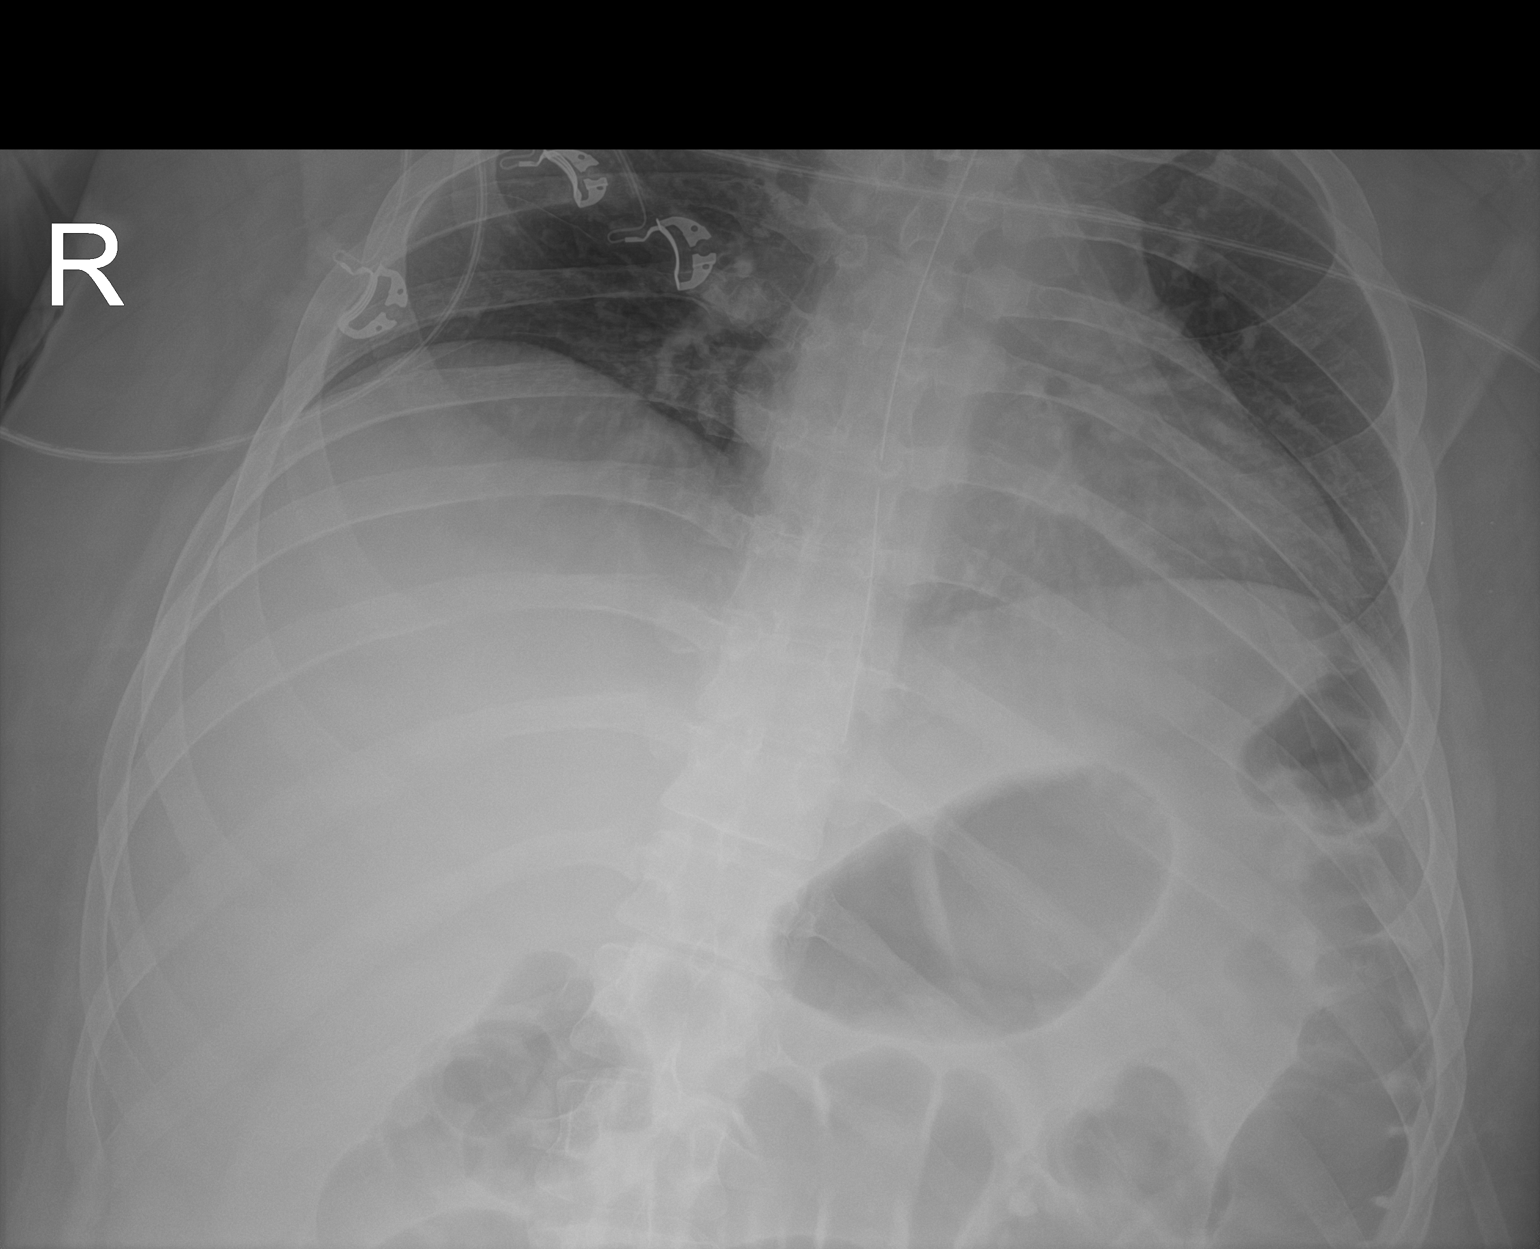

[1 of 1 positions shown; findings below may reference images not displayed]

FINDINGS: The side port of the NG tube is in the distal esophagus. Recommend
advancing by 7-10 cm. Lungs are clear. Nonobstructive bowel gas
pattern.
IMPRESSION: 1. Side-port of the nasogastric tube in the distal esophagus.
Recommend advancing by 7-10 cm.
2. No active cardiopulmonary disease.

## 2018-12-20 IMAGING — DX DG CHEST 1V PORT
1 series · 1 of 1 positions shown · non-contrast
Comparison: [DATE] and older exams.

CLINICAL DATA: Fever. History of hypertension seizures and
pancreatitis.

EXAM:
PORTABLE CHEST 1 VIEW

[chest]
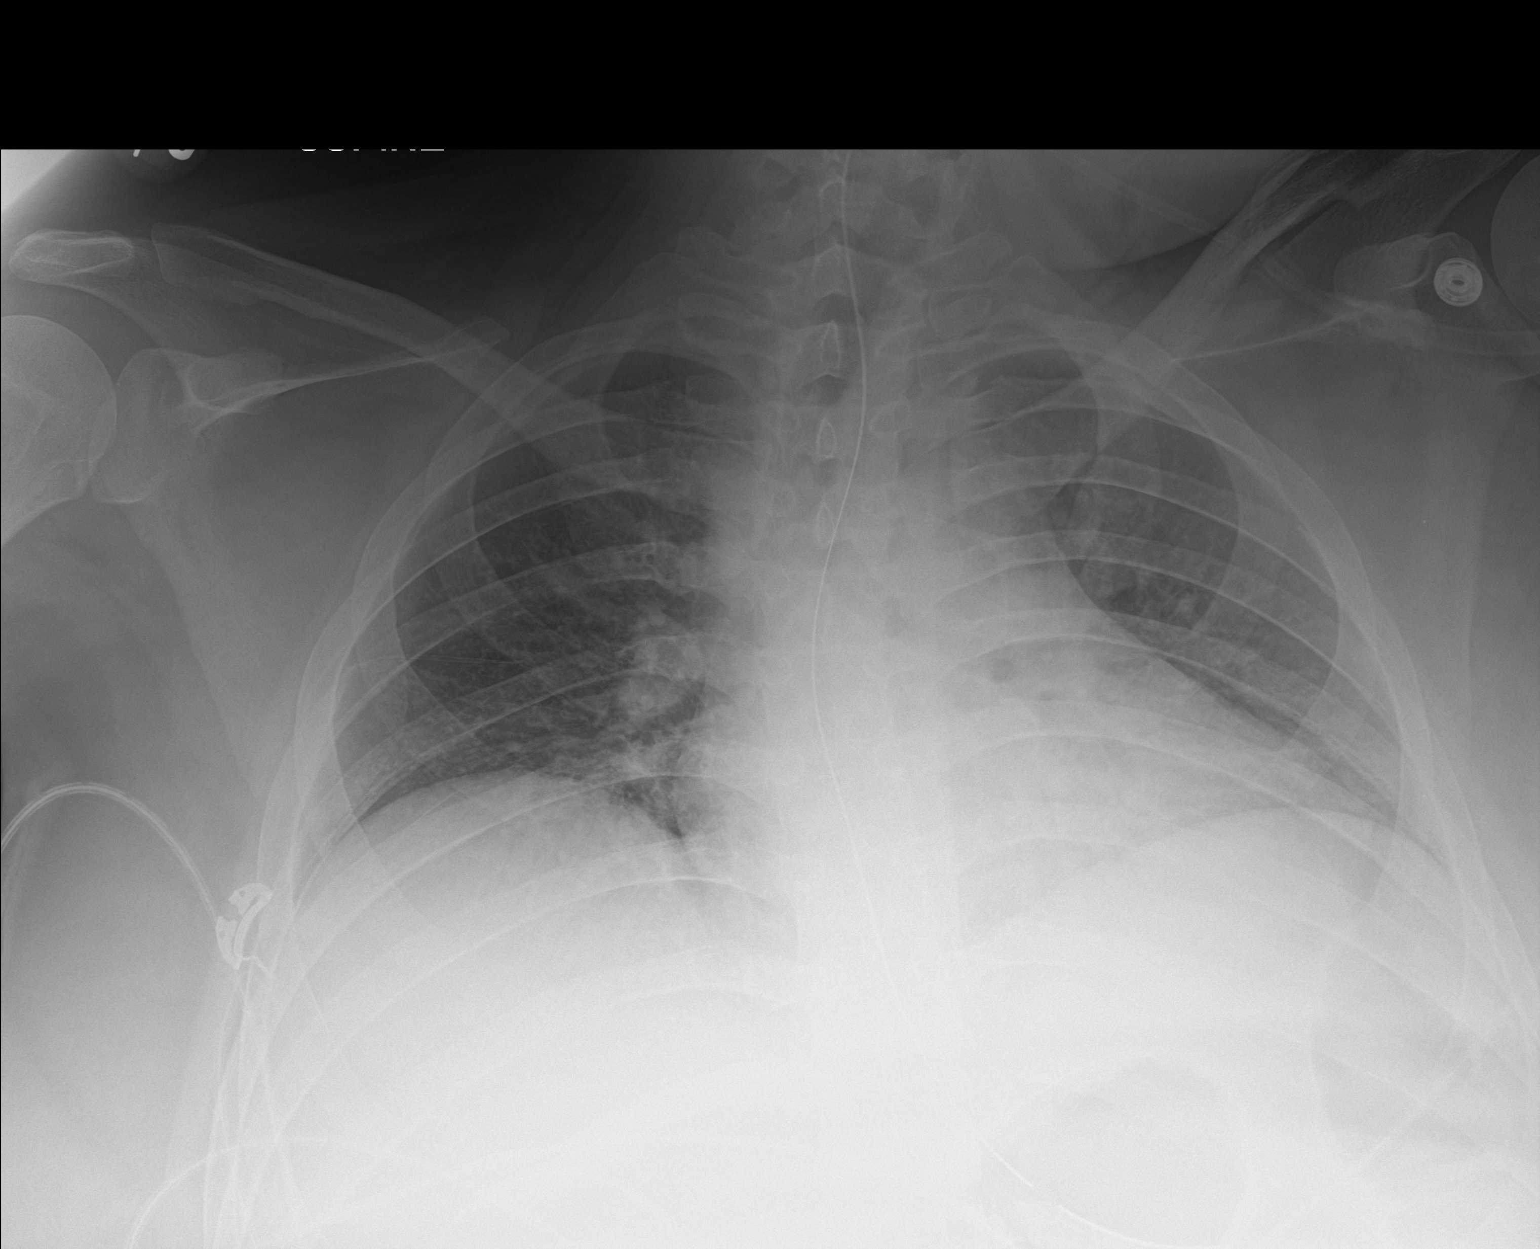

[1 of 1 positions shown; findings below may reference images not displayed]

FINDINGS: Lung volumes are low. Atelectasis in the medial lung bases. Lungs
otherwise clear.

No pleural effusion or pneumothorax.

Cardiac silhouette normal in size.  No mediastinal or hilar masses.

Nasogastric tube passes below the diaphragm well into the stomach.
IMPRESSION: 1. No acute cardiopulmonary disease.

## 2018-12-20 IMAGING — DX DG ABD PORTABLE 1V
1 series · 1 of 1 positions shown · non-contrast
Comparison: [DATE]

CLINICAL DATA: Check gastric catheter placement

EXAM:
PORTABLE ABDOMEN - 1 VIEW

[abdomen kub]
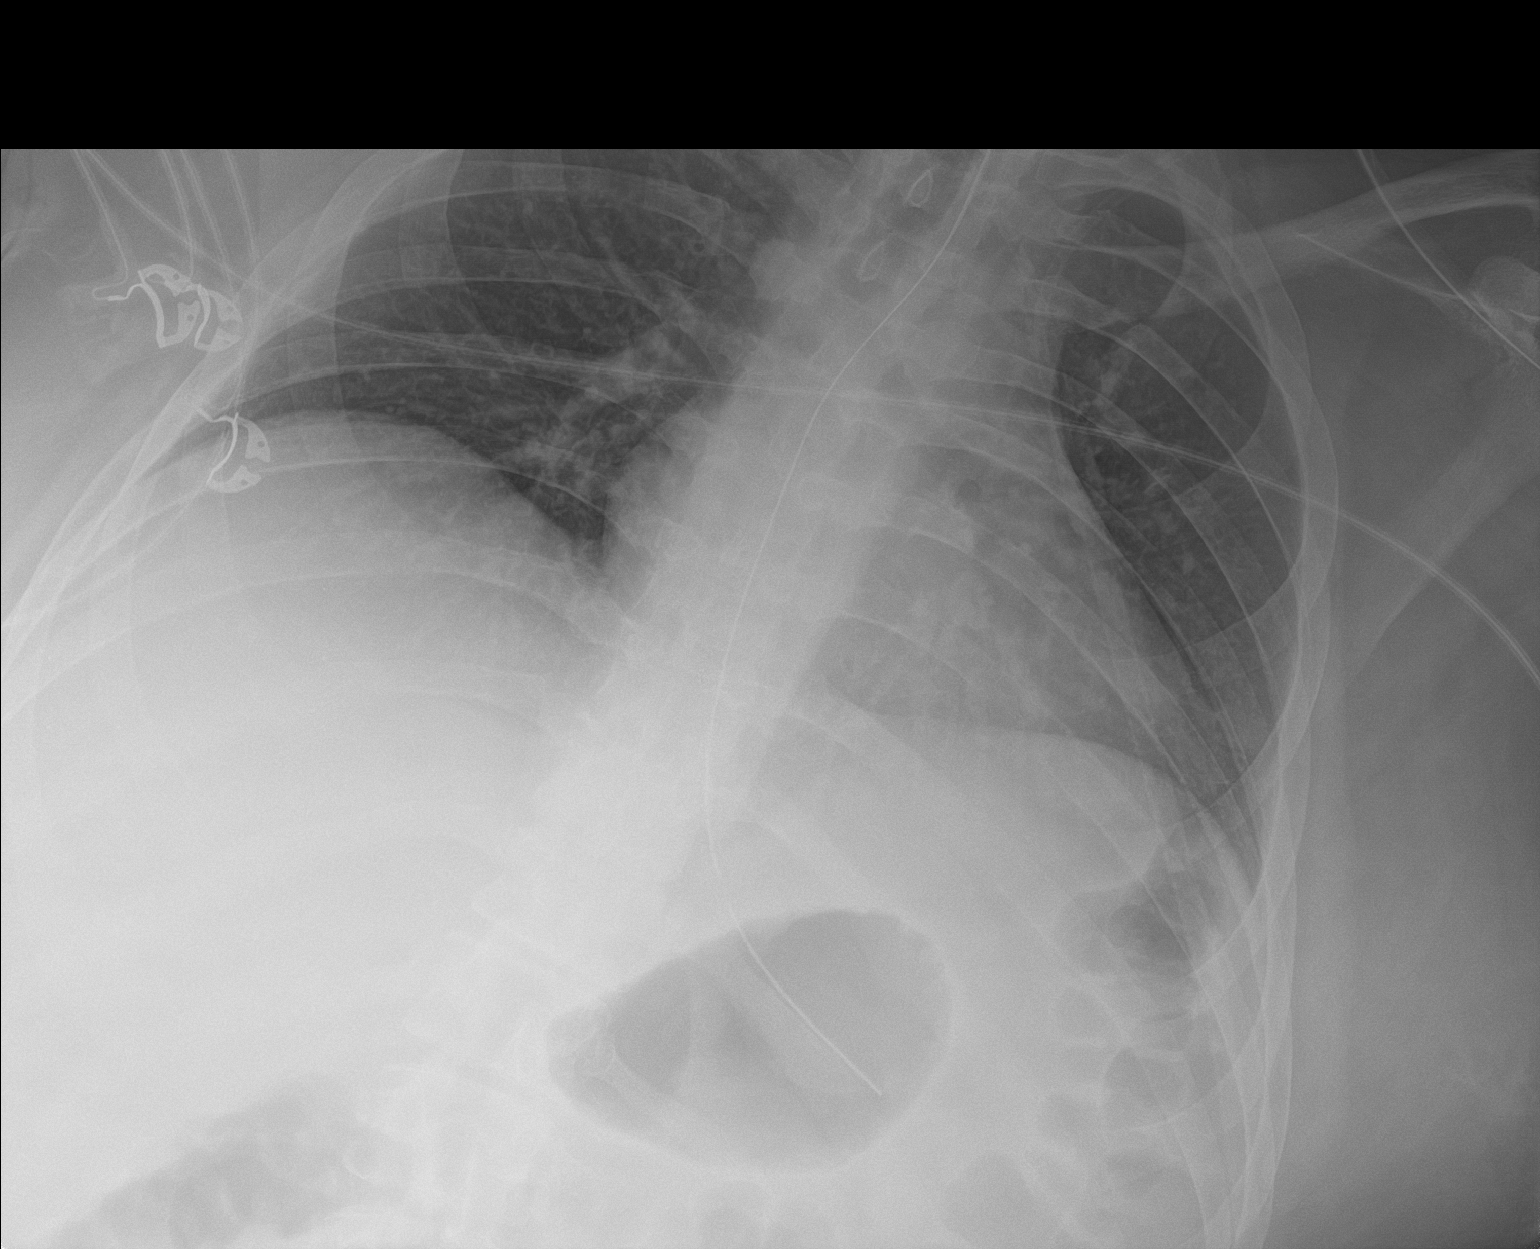

[1 of 1 positions shown; findings below may reference images not displayed]

FINDINGS: Gastric catheter has been advanced with the tip in the stomach.
Proximal side port lies at the gastroesophageal junction. This could
be advanced several cm. The lungs are clear.
IMPRESSION: Gastric catheter as described. The proximal side port is not within
the stomach and should be advanced several cm.

## 2018-12-20 MED ORDER — IBUPROFEN 100 MG/5ML PO SUSP
400.0000 mg | Freq: Four times a day (QID) | ORAL | Status: DC | PRN
  Administered 2018-12-20 – 2019-01-21 (×14): 400 mg
  Filled 2018-12-20 (×20): qty 20

## 2018-12-20 MED ORDER — IBUPROFEN 100 MG/5ML PO SUSP: 400.0000 mg | Freq: Four times a day (QID) | ORAL | Status: DC | PRN

## 2018-12-20 MED ORDER — DILTIAZEM HCL 25 MG/5ML IV SOLN
10.0000 mg | Freq: Once | INTRAVENOUS | Status: AC
  Administered 2018-12-20: 10 mg via INTRAVENOUS
  Filled 2018-12-20: qty 5

## 2018-12-20 MED ORDER — SODIUM CHLORIDE 0.9 % IV BOLUS
500.0000 mL | Freq: Once | INTRAVENOUS | Status: AC
  Administered 2018-12-20: 22:00:00 500 mL via INTRAVENOUS

## 2018-12-20 MED ORDER — METOPROLOL TARTRATE 25 MG/10 ML ORAL SUSPENSION
100.0000 mg | Freq: Two times a day (BID) | ORAL | Status: DC
  Administered 2018-12-20 – 2018-12-25 (×7): 100 mg via NASOGASTRIC
  Filled 2018-12-20 (×9): qty 40

## 2018-12-20 MED ORDER — KETOROLAC TROMETHAMINE 30 MG/ML IJ SOLN
30.0000 mg | Freq: Once | INTRAMUSCULAR | Status: AC
  Administered 2018-12-20: 22:00:00 30 mg via INTRAVENOUS
  Filled 2018-12-20: qty 1

## 2018-12-20 MED ORDER — ACETAMINOPHEN 160 MG/5ML PO SOLN
650.0000 mg | Freq: Four times a day (QID) | ORAL | Status: DC | PRN
  Administered 2018-12-20 – 2019-01-04 (×24): 650 mg
  Filled 2018-12-20 (×26): qty 20.3

## 2018-12-20 MED ORDER — CYANOCOBALAMIN 500 MCG PO TABS
250.0000 ug | ORAL_TABLET | Freq: Every day | ORAL | Status: DC
  Administered 2018-12-20 – 2018-12-21 (×2): 250 ug via ORAL
  Filled 2018-12-20: qty 1
  Filled 2018-12-20 (×3): qty 3

## 2018-12-20 NOTE — Progress Notes (Addendum)
PROGRESS NOTE    Aaron Vega  [name] DOB: Sep 06, 1995 DOA: 12/06/2018 [name]     Brief Narrative:  Aaron Vega is a 23 yo male with past medical history significant for severe autism with seizures, nonverbal at baseline who presented with declining health.  He suffered a severe seizure at home and struck his head in August 2020 and has been worsening in mentation since.  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  He presented to the emergency department for increasing lethargy, vomiting and decreased urine output.  He had been previously enrolled in hospice at home, but mother has now revoked hospice.   New events last 24 hours / Subjective: Fevers overnight.  T-max 101.3.  Otherwise neurologically, no change per mom at bedside.  Assessment & Plan:   Principal Problem:   Severe sepsis (HCC) Active Problems:   Autistic disorder   Intractable nausea and vomiting   Seizure disorder (HCC)   Constipation in male   Obesity, Class III, BMI 40-49.9 (morbid obesity) (HCC)   Ileus (HCC)   Aspiration pneumonia (HCC)   Community acquired pneumonia of left lower lobe of lung   Goals of care, counseling/discussion   Palliative care by specialist   DNR (do not resuscitate) discussion   Protein-calorie malnutrition, severe    Acute on chronic respiratory failure Obstructive sleep apnea history, not tolerant of positive pressure ventilation Sepsis secondary to aspiration pneumonia -S/p Cefepime, flagyl x 3 days -Continue aspiration precautions  Fevers -Patient has had fevers last 48 hours.  He has now been off of antibiotics for about a week.  Check chest x-ray, blood culture, UA as well as DVT ultrasound to rule out DVT.  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 Vit B12 159. Continue thiamine, B12, MV  -Continue Keppra, Onfi   Concern for [employer] -Completed 5 days of IVIG  Ileus -Continue erythromycin -Having BM  Hypothyroidism -Continue synthroid   HTN -Continue lisinopril, metoprolol   Elevated liver enzymes -Trend LFT, improving    DVT prophylaxis: Subcutaneous heparin Code Status: Full code Family Communication: Mother at bedside Disposition Plan: Continue tube feedings for now.  Work-up for fever in process   Consultants:   PCCM  Neurology  Palliative care   Antimicrobials:  Anti-infectives (From admission, onward)   Start     Dose/Rate Route Frequency Ordered Stop   12/19/18 1400  erythromycin (EES) 400 MG/5ML suspension 500 mg     500 mg Per Tube Every 8 hours 12/19/18 1132     12/18/18 1500  erythromycin 500 mg in sodium chloride 0.9 % 100 mL IVPB  Status:  Discontinued     500 mg 100 mL/hr over 60 Minutes Intravenous Every 8 hours 12/18/18 1449 12/19/18 1132   12/15/18 1400  erythromycin (EES) 400 MG/5ML suspension 400 mg  Status:  Discontinued     400 mg Per Tube Every 8 hours 12/15/18 1012 12/15/18 1130   12/15/18 1400  erythromycin 500 mg in sodium chloride 0.9 % 100 mL IVPB  Status:  Discontinued     500 mg 100 mL/hr over 60 Minutes Intravenous Every 8 hours 12/15/18 1130 12/18/18 1449   12/09/18 0600  vancomycin (VANCOCIN) 1,250 mg in sodium chloride 0.9 % 250 mL IVPB  Status:  Discontinued     1,250 mg 166.7  mL/hr over 90 Minutes Intravenous Every 12 hours 12/15/2018 1539 12/10/18 1043   12/09/18 0000  ceFEPIme (MAXIPIME) 2 g in sodium chloride 0.9 % 100 mL IVPB  Status:  Discontinued     2 g 200 mL/hr over 30 Minutes Intravenous Every 8 hours 12/01/2018 1539 12/11/18 1801   12/09/18 0000  metroNIDAZOLE (FLAGYL) IVPB 500 mg  Status:  Discontinued     500 mg 100 mL/hr over 60 Minutes Intravenous Every 8 hours 12/14/2018  2146 12/11/18 1801   12/06/2018 1415  ceFEPIme (MAXIPIME) 2 g in sodium chloride 0.9 % 100 mL IVPB     2 g 200 mL/hr over 30 Minutes Intravenous  Once 12/18/2018 1411 11/25/2018 1538   11/28/2018 1415  metroNIDAZOLE (FLAGYL) IVPB 500 mg     500 mg 100 mL/hr over 60 Minutes Intravenous  Once 12/03/2018 1411 11/26/2018 1609   12/09/2018 1415  vancomycin (VANCOCIN) IVPB 1000 mg/200 mL premix  Status:  Discontinued     1,000 mg 200 mL/hr over 60 Minutes Intravenous  Once 12/20/2018 1411 12/13/2018 1414   12/12/2018 1415  vancomycin (VANCOCIN) 2,500 mg in sodium chloride 0.9 % 500 mL IVPB     2,500 mg 250 mL/hr over 120 Minutes Intravenous  Once 12/23/2018 1414 12/21/2018 1710       Objective: Vitals:   12/20/18 0900 12/20/18 0913 12/20/18 1135 12/20/18 1206  BP: 133/69     Pulse:      Resp: (!) 29     Temp:  (!) 100.9 F (38.3 C) (!) 101.3 F (38.5 C) 100.3 F (37.9 C)  TempSrc:  Axillary Rectal Axillary  SpO2:      Weight:      Height:        Intake/Output Summary (Last 24 hours) at 12/20/2018 1211 Last data filed at 12/20/2018 0930 Gross per 24 hour  Intake 1062.73 ml  Output 700 ml  Net 362.73 ml   Filed Weights   12/15/18 0404 12/17/18 0500 12/18/18 0500  Weight: (!) 145.7 kg (!) 136.3 kg (!) 136.9 kg    Examination: General exam: Appears calm and comfortable  Respiratory system: Clear to auscultation. Respiratory effort normal. Cardiovascular system: S1 & S2 heard, tachycardic, regular rhythm. No pedal edema. Gastrointestinal system: Abdomen is nondistended, soft and nontender.  Central nervous system: Alert, nonverbal at baseline Extremities: Symmetric in appearance bilaterally  Skin: No rashes, lesions or ulcers on exposed skin    Data Reviewed: I have personally reviewed following labs and imaging studies  CBC: Recent Labs  Lab 12/18/18 0159 12/20/18 0827  WBC 7.0 6.0  NEUTROABS 3.8  --   HGB 9.9* 9.8*  HCT 30.1* 31.3*  MCV 87.0 90.7  PLT 383 440*   Basic Metabolic  Panel: Recent Labs  Lab 12/15/18 0436 12/18/18 0159 12/19/18 0857 12/20/18 0223  NA 138 137 137 138  K 5.3* 3.4* 3.6 3.9  CL 100 100 101 103  CO2 GLUCOSE 79 99 99 100*  BUN <5* <5* 6 6  CREATININE 0.43* 0.42* 0.39* 0.34*  CALCIUM 9.0 9.0 9.4 9.4  MG 1.8 1.6* 1.7 1.8  PHOS 3.5  --   --   --    GFR: Estimated Creatinine Clearance: 206.4 mL/min (A) (by C-G formula based on SCr of 0.34 mg/dL (L)). Liver Function Tests: Recent Labs  Lab 12/14/18 1729 12/18/18 0159 12/20/18 0223  AST  --  117* 81*  ALT  --  134* 102*  ALKPHOS  --  33*  30*  BILITOT  --  1.1 1.2  PROT  --  8.2* 9.4*  ALBUMIN 3.3* 2.6* 2.5*   No results for input(s): LIPASE, AMYLASE in the last 168 hours. No results for input(s): AMMONIA in the last 168 hours. Coagulation Profile: Recent Labs  Lab 12/13/18 1530 12/14/18 0841  INR 1.1 1.0   Cardiac Enzymes: No results for input(s): CKTOTAL, CKMB, CKMBINDEX, TROPONINI in the last 168 hours. BNP (last 3 results) No results for input(s): PROBNP in the last 8760 hours. HbA1C: No results for input(s): HGBA1C in the last 72 hours. CBG: Recent Labs  Lab 12/19/18 2019 12/20/18 0032 12/20/18 0441 12/20/18 0746 12/20/18 1203  GLUCAP 95 96 105* 102* 102*   Lipid Profile: No results for input(s): CHOL, HDL, LDLCALC, TRIG, CHOLHDL, LDLDIRECT in the last 72 hours. Thyroid Function Tests: No results for input(s): TSH, T4TOTAL, FREET4, T3FREE, THYROIDAB in the last 72 hours. Anemia Panel: No results for input(s): VITAMINB12, FOLATE, FERRITIN, TIBC, IRON, RETICCTPCT in the last 72 hours. Sepsis Labs: No results for input(s): PROCALCITON, LATICACIDVEN in the last 168 hours.  Recent Results (from the past 240 hour(s))  MRSA PCR Screening     Status: None   Collection Time: 12/10/18  2:36 PM   Specimen: Nasal Mucosa; Nasopharyngeal  Result Value Ref Range Status   MRSA by PCR NEGATIVE NEGATIVE Final    Comment:        The GeneXpert MRSA Assay  (FDA approved for NASAL specimens only), is one component of a comprehensive MRSA colonization surveillance program. It is not intended to diagnose MRSA infection nor to guide or monitor treatment for MRSA infections. Performed at [facility], [address] [place], [place] [address]       Radiology Studies: Mr Thoracic Spine W Wo Contrast  Result Date: 12/19/2018 CLINICAL DATA:  Quadriplegia EXAM: MRI THORACIC AND LUMBAR SPINE WITHOUT AND WITH CONTRAST TECHNIQUE: Multiplanar and multiecho pulse sequences of the thoracic and lumbar spine were obtained without and with intravenous contrast. CONTRAST:  10mL GADAVIST GADOBUTROL 1 MMOL/ML IV SOLN COMPARISON:  CT thoracic lumbar 12/14/2018 FINDINGS: MRI THORACIC SPINE FINDINGS Alignment:  Normal. Image quality degraded by moderate to extensive motion. Vertebrae: Negative for fracture or mass. Normal enhancement of the spine. Cord: Limited cord evaluation due to motion. No cord lesion or cord compression identified. Paraspinal and other soft tissues: Negative Disc levels: Negative for disc protrusion or spinal stenosis. MRI LUMBAR SPINE FINDINGS Segmentation:  Normal Motion degraded study. There is progressive motion on the study which becomes more severe on the axial images. Alignment:  Normal Vertebrae:  Negative for fracture or mass. Conus medullaris and cauda equina: Conus extends to the T12-L1 level. Conus and cauda equina appear normal. Paraspinal and other soft tissues: Negative for paraspinous mass or adenopathy. Normal soft tissue enhancement. Disc levels: No significant abnormality at L3-4 or above Mild disc bulging and mild facet degeneration L4-5 and L5-S1. Negative for disc protrusion or stenosis IMPRESSION: Motion degraded study, worse on the thoracic compared to the lumbar spine. No acute thoracic abnormality. Negative for fracture, infection, or spinal stenosis. No acute lumbar abnormality. Mild disc and facet degeneration L4-5  and L5-S1 without stenosis. Electronically Signed   By: Marlan Palauharles  Clark M.D.   On: 12/19/2018 12:05   Mr Lumbar Spine W Wo Contrast  Result Date: 12/19/2018 CLINICAL DATA:  Quadriplegia EXAM: MRI THORACIC AND LUMBAR SPINE WITHOUT AND WITH CONTRAST TECHNIQUE: Multiplanar and multiecho pulse sequences of the thoracic and lumbar spine  were obtained without and with intravenous contrast. CONTRAST:  69mL GADAVIST GADOBUTROL 1 MMOL/ML IV SOLN COMPARISON:  CT thoracic lumbar 12/14/2018 FINDINGS: MRI THORACIC SPINE FINDINGS Alignment:  Normal. Image quality degraded by moderate to extensive motion. Vertebrae: Negative for fracture or mass. Normal enhancement of the spine. Cord: Limited cord evaluation due to motion. No cord lesion or cord compression identified. Paraspinal and other soft tissues: Negative Disc levels: Negative for disc protrusion or spinal stenosis. MRI LUMBAR SPINE FINDINGS Segmentation:  Normal Motion degraded study. There is progressive motion on the study which becomes more severe on the axial images. Alignment:  Normal Vertebrae:  Negative for fracture or mass. Conus medullaris and cauda equina: Conus extends to the T12-L1 level. Conus and cauda equina appear normal. Paraspinal and other soft tissues: Negative for paraspinous mass or adenopathy. Normal soft tissue enhancement. Disc levels: No significant abnormality at L3-4 or above Mild disc bulging and mild facet degeneration L4-5 and L5-S1. Negative for disc protrusion or stenosis IMPRESSION: Motion degraded study, worse on the thoracic compared to the lumbar spine. No acute thoracic abnormality. Negative for fracture, infection, or spinal stenosis. No acute lumbar abnormality. Mild disc and facet degeneration L4-5 and L5-S1 without stenosis. Electronically Signed   By: Franchot Gallo M.D.   On: 12/19/2018 12:05   Dg Abd Portable 1v  Result Date: 12/20/2018 CLINICAL DATA:  NG placement EXAM: PORTABLE ABDOMEN - 1 VIEW COMPARISON:  Radiograph  12/20/2018 FINDINGS: Transesophageal tube tip and side port distal to the GE junction, curling in the left upper quadrant in the region of the gastric body. Atelectatic changes in the otherwise clear lung bases. Gaseous distention of what appears to be the colon without high-grade bowel obstruction. IMPRESSION: 1. Transesophageal tube tip and side port in the region of the gastric body. 2. Gaseous distention of the colon without high-grade bowel obstruction. Electronically Signed   By: Lovena Le M.D.   On: 12/20/2018 02:45   Dg Abd Portable 1v  Result Date: 12/20/2018 CLINICAL DATA:  Check gastric catheter placement EXAM: PORTABLE ABDOMEN - 1 VIEW COMPARISON:  12/20/2018 FINDINGS: Gastric catheter has been advanced with the tip in the stomach. Proximal side port lies at the gastroesophageal junction. This could be advanced several cm. The lungs are clear. IMPRESSION: Gastric catheter as described. The proximal side port is not within the stomach and should be advanced several cm. Electronically Signed   By: Inez Catalina M.D.   On: 12/20/2018 01:50   Dg Abd Portable 1v  Result Date: 12/20/2018 CLINICAL DATA:  NG tube placement EXAM: PORTABLE ABDOMEN - 1 VIEW COMPARISON:  12/16/2018 abdominal radiograph FINDINGS: The side port of the NG tube is in the distal esophagus. Recommend advancing by 7-10 cm. Lungs are clear. Nonobstructive bowel gas pattern. IMPRESSION: 1. Side-port of the nasogastric tube in the distal esophagus. Recommend advancing by 7-10 cm. 2. No active cardiopulmonary disease. Electronically Signed   By: Ulyses Jarred M.D.   On: 12/20/2018 00:41      Scheduled Meds:  chlorhexidine  15 mL Mouth Rinse BID   Chlorhexidine Gluconate Cloth  6 each Topical Q0600   cloBAZam  60 mg Per Tube BID   erythromycin  500 mg Per Tube Q8H   feeding supplement (VITAL 1.5 CAL)  1,000 mL Per Tube Q24H   heparin injection (subcutaneous)  5,000 Units Subcutaneous Q8H   levETIRAcetam  1,000 mg  Per Tube BID   levothyroxine  25 mcg Per Tube Q0600   lisinopril  20  mg Per NG tube Daily   magnesium oxide  400 mg Per NG tube BID   mouth rinse  15 mL Mouth Rinse q12n4p   metoprolol tartrate  50 mg Per NG tube BID   multivitamin  15 mL Per Tube Daily   nystatin   Topical Daily   pantoprazole sodium  40 mg Per Tube BID   polyethylene glycol  17 g Per Tube BID   sennosides  5 mL Per Tube BID   sodium chloride flush  10-40 mL Intracatheter Q12H   thiamine  100 mg Per Tube Daily   Continuous Infusions:  dextrose 5% lactated ringers 1,000 mL (12/19/18 1600)     LOS: 12 days      Time spent: 35 minutes   Noralee Stain, DO [facility] 12/20/2018, 12:11 PM   Available via Epic secure chat 7am-7pm After these hours, please refer to coverage provider listed on [email or web address]

## 2018-12-20 NOTE — Progress Notes (Signed)
Subjective: Maybe slight improvement at the hip.   Exam: Vitals:   12/20/18 1700 12/20/18 1800  BP: 104/68 114/64  Pulse:    Resp: (!) 32 (!) 22  Temp:    SpO2: 96%    Gen: In bed, NAD Resp: non-labored breathing, no acute distress Abd: soft, nt  Neuro: MS: Eyes open, nonverbal at baseline, does not follow commands CN: Pupils equal round reactive, eyes cross midline in both directions, blinks to threat Motor: He moves his bilateral arms against gravity, but with distal weakness in the hands, in the legs, he has minimal proximal flexion to noxious stimulation bilaterally.  He has slightly more abduction and adduction at the hips today than I have seen previously. Sensory: He grimaces to noxious stimulation of the lower extremities DTR: Areflexic throughout  Pertinent Labs: CSF -0 WBC -550 RBC -Protein 172 -Glucose 60  Zinc- normal at 120 Serum copper- normal at 122 B12 low at 159 B1-low at 22  Impression: 23 year old male with autism and seizure disorder who presents with an acute to subacute neuropathy over the past 6 weeks. On further discussion with his mother, the distal weakness began FIRST, follwoed by the nausea and vomiting. This, coupled with the elevated CSF protein, makes GBS the more likely cuprit, but his B1 was low at 28 on admission as well, keeping this in the differential.   He has finished 3 days of high-dose IV thiamine and today is day 5 of IVIG. I do not think, especially this far into symptoms, that further immune treatment beyond IVIG would be indicated. Treatment moving forward will be primarily be physical therapy.   I actually wonder if there may be a multifactorial process including Guyon Aris Lot as initial agent with B1 deficiency playing a role later in the course.  Recommendations: 1) continue thiamine supplementation 100 mg daily 2) multivitamin 3) at this point I think therapy is the primary intervention, and transitioning to more rehab  oriented approach at this point would be prudent. 4) neurology will follow  Roland Rack, MD [facility] [phone]  If 7pm- 7am, please page neurology on call as listed in [place].

## 2018-12-20 NOTE — Progress Notes (Addendum)
Patient's temp 101.3 and tylenol not able to be administered until 0922. Dr. Maylene Roes paged.   0827: PRN ibuprofen administered.  0913: repeat temp 100.9.

## 2018-12-20 NOTE — Progress Notes (Signed)
   12/20/18 0308  Vitals  Temp (!) 100.5 F (38.1 C)  Temp Source Axillary  BP (!) 136/95  Pulse Rate (!) 130  MEWS Score  MEWS RR 0  MEWS Pulse 3  MEWS Systolic 0  MEWS LOC 0  MEWS Temp 1  MEWS Score 4  MEWS Score Color Red  MEWS Assessment  Is this an acute change? Yes    Paged and spoke with Bodenheimer NP on call. Mindy with rapid response notified and updated.

## 2018-12-20 NOTE — Plan of Care (Signed)

## 2018-12-20 NOTE — Progress Notes (Signed)
Bilateral lower extremity venous duplex has been completed. Preliminary results can be found in CV Proc through chart review.   12/20/18 11:05 AM Aaron Vega RVT

## 2018-12-20 NOTE — Progress Notes (Signed)
   12/20/18 1605  Urine Characteristics  Urinary Interventions Bladder scan  Bladder Scan Volume (mL) 277 mL   Patient had one episode of incontinence this shift. Unable to obtain urine sample. Dr. Maylene Roes paged to notify. Will reassess prior to shift change.

## 2018-12-20 NOTE — Progress Notes (Signed)
Patient's tube feeds increased to 87ml/hr per order at 0857. Will continue to monitor.

## 2018-12-20 NOTE — Progress Notes (Signed)
   12/20/18 0749  Vitals  Temp (!) 101.3 F (38.5 C)  Temp Source Axillary  ECG Heart Rate (!) 126  Resp (!) 33  MEWS Score  MEWS RR 2  MEWS Pulse 2  MEWS Systolic 0  MEWS LOC 0  MEWS Temp 1  MEWS Score 5  MEWS Score Color Red  MEWS Assessment  Is this an acute change? No   Patient's MEWS red this morning. This is not an acute change. Patient remains febrile, tachycardiac and tachypneic. MD aware. Will continue to monitor.

## 2018-12-20 NOTE — Progress Notes (Signed)
   12/20/18 1135  Vitals  Temp (!) 101.3 F (38.5 C)  Temp Source Rectal  MEWS Score  MEWS RR 2  MEWS Pulse 2  MEWS Systolic 0  MEWS LOC 0  MEWS Temp 1  MEWS Score 5  MEWS Score Color Red  MEWS Assessment  Is this an acute change? No   PRN Tylenol administered.

## 2018-12-20 NOTE — Progress Notes (Signed)
Tube feed residuals 290. TF held.  Patient continues to have HR in 130, elevated temperature, profuse sweating. Jeannette Corpus paged and notified.

## 2018-12-20 NOTE — Significant Event (Signed)
Rapid Response Event Note  Overview:Called d/t MEWS-4. HR-130s, T-100.8. This is not an acute change. Plan: tylenol and recheck temp. Please call RRT if assistance needed.     Aaron Vega

## 2018-12-20 NOTE — Progress Notes (Signed)
Repeat bladder scan was 371. Dr. Maylene Roes paged and ordered one time in & out catheterization. Patient voided 400 mls of dark, amber urine. UA and urine culture sent down to lab. Will continue to monitor.  Hiram Comber, RN 12/20/2018 6:33 PM

## 2018-12-21 ENCOUNTER — Inpatient Hospital Stay (HOSPITAL_COMMUNITY)

## 2018-12-21 DIAGNOSIS — G610 Guillain-Barre syndrome: Secondary | ICD-10-CM

## 2018-12-21 DIAGNOSIS — A419 Sepsis, unspecified organism: Secondary | ICD-10-CM | POA: Diagnosis not present

## 2018-12-21 DIAGNOSIS — T17908D Unspecified foreign body in respiratory tract, part unspecified causing other injury, subsequent encounter: Secondary | ICD-10-CM | POA: Diagnosis not present

## 2018-12-21 DIAGNOSIS — T17908A Unspecified foreign body in respiratory tract, part unspecified causing other injury, initial encounter: Secondary | ICD-10-CM | POA: Diagnosis present

## 2018-12-21 DIAGNOSIS — K567 Ileus, unspecified: Secondary | ICD-10-CM | POA: Diagnosis not present

## 2018-12-21 LAB — COMPREHENSIVE METABOLIC PANEL
ALT: 93 U/L — ABNORMAL HIGH (ref 0–44)
AST: 70 U/L — ABNORMAL HIGH (ref 15–41)
Albumin: 2.7 g/dL — ABNORMAL LOW (ref 3.5–5.0)
Alkaline Phosphatase: 33 U/L — ABNORMAL LOW (ref 38–126)
Anion gap: 9 (ref 5–15)
BUN: 10 mg/dL (ref 6–20)
CO2: 29 mmol/L (ref 22–32)
Calcium: 9.6 mg/dL (ref 8.9–10.3)
Chloride: 105 mmol/L (ref 98–111)
Creatinine, Ser: 0.54 mg/dL — ABNORMAL LOW (ref 0.61–1.24)
GFR calc Af Amer: >60 mL/min (ref >60)
GFR calc non Af Amer: >60 mL/min (ref >60)
Glucose, Bld: 105 mg/dL — ABNORMAL HIGH (ref 70–99)
Potassium: 4.1 mmol/L (ref 3.5–5.1)
Sodium: 143 mmol/L (ref 135–145)
Total Bilirubin: 1.2 mg/dL (ref 0.3–1.2)
Total Protein: 9.1 g/dL — ABNORMAL HIGH (ref 6.5–8.1)

## 2018-12-21 LAB — POCT I-STAT 7, (LYTES, BLD GAS, ICA,H+H)
Acid-Base Excess: 4 mmol/L — ABNORMAL HIGH (ref 0.0–2.0)
Bicarbonate: 27.8 mmol/L (ref 20.0–28.0)
Calcium, Ion: 1.26 mmol/L (ref 1.15–1.40)
HCT: 33 % — ABNORMAL LOW (ref 39.0–52.0)
Hemoglobin: 11.2 g/dL — ABNORMAL LOW (ref 13.0–17.0)
O2 Saturation: 93 %
Patient temperature: 104
Potassium: 4.5 mmol/L (ref 3.5–5.1)
Sodium: 145 mmol/L (ref 135–145)
TCO2: 29 mmol/L (ref 22–32)
pCO2 arterial: 42.4 mmHg (ref 32.0–48.0)
pH, Arterial: 7.437 (ref 7.350–7.450)
pO2, Arterial: 75 mmHg — ABNORMAL LOW (ref 83.0–108.0)

## 2018-12-21 LAB — CBC
HCT: 32.9 % — ABNORMAL LOW (ref 39.0–52.0)
Hemoglobin: 10.4 g/dL — ABNORMAL LOW (ref 13.0–17.0)
MCH: 28.3 pg (ref 26.0–34.0)
MCHC: 31.6 g/dL (ref 30.0–36.0)
MCV: 89.4 fL (ref 80.0–100.0)
Platelets: 669 10*3/uL — ABNORMAL HIGH (ref 150–400)
RBC: 3.68 MIL/uL — ABNORMAL LOW (ref 4.22–5.81)
RDW: 14.7 % (ref 11.5–15.5)
WBC: 10.2 10*3/uL (ref 4.0–10.5)
nRBC: 0 % (ref 0.0–0.2)

## 2018-12-21 LAB — GLUCOSE, CAPILLARY
Glucose-Capillary: 105 mg/dL — ABNORMAL HIGH (ref 70–99)
Glucose-Capillary: 106 mg/dL — ABNORMAL HIGH (ref 70–99)
Glucose-Capillary: 107 mg/dL — ABNORMAL HIGH (ref 70–99)
Glucose-Capillary: 109 mg/dL — ABNORMAL HIGH (ref 70–99)
Glucose-Capillary: 88 mg/dL (ref 70–99)
Glucose-Capillary: 94 mg/dL (ref 70–99)
Glucose-Capillary: 97 mg/dL (ref 70–99)

## 2018-12-21 LAB — PROCALCITONIN: Procalcitonin: 0.28 ng/mL

## 2018-12-21 LAB — LACTIC ACID, PLASMA
Lactic Acid, Venous: 2.9 mmol/L (ref 0.5–1.9)
Lactic Acid, Venous: 3 mmol/L (ref 0.5–1.9)

## 2018-12-21 LAB — D-DIMER, QUANTITATIVE: D-Dimer, Quant: 0.78 ug/mL-FEU — ABNORMAL HIGH (ref 0.00–0.50)

## 2018-12-21 IMAGING — DX DG CHEST 1V PORT
1 series · 1 of 1 positions shown · non-contrast
Comparison: Chest x-ray [DATE].

CLINICAL DATA: 23-year-old male with history of central line and
nasogastric tube placement.

EXAM:
PORTABLE CHEST 1 VIEW

[chest ap]
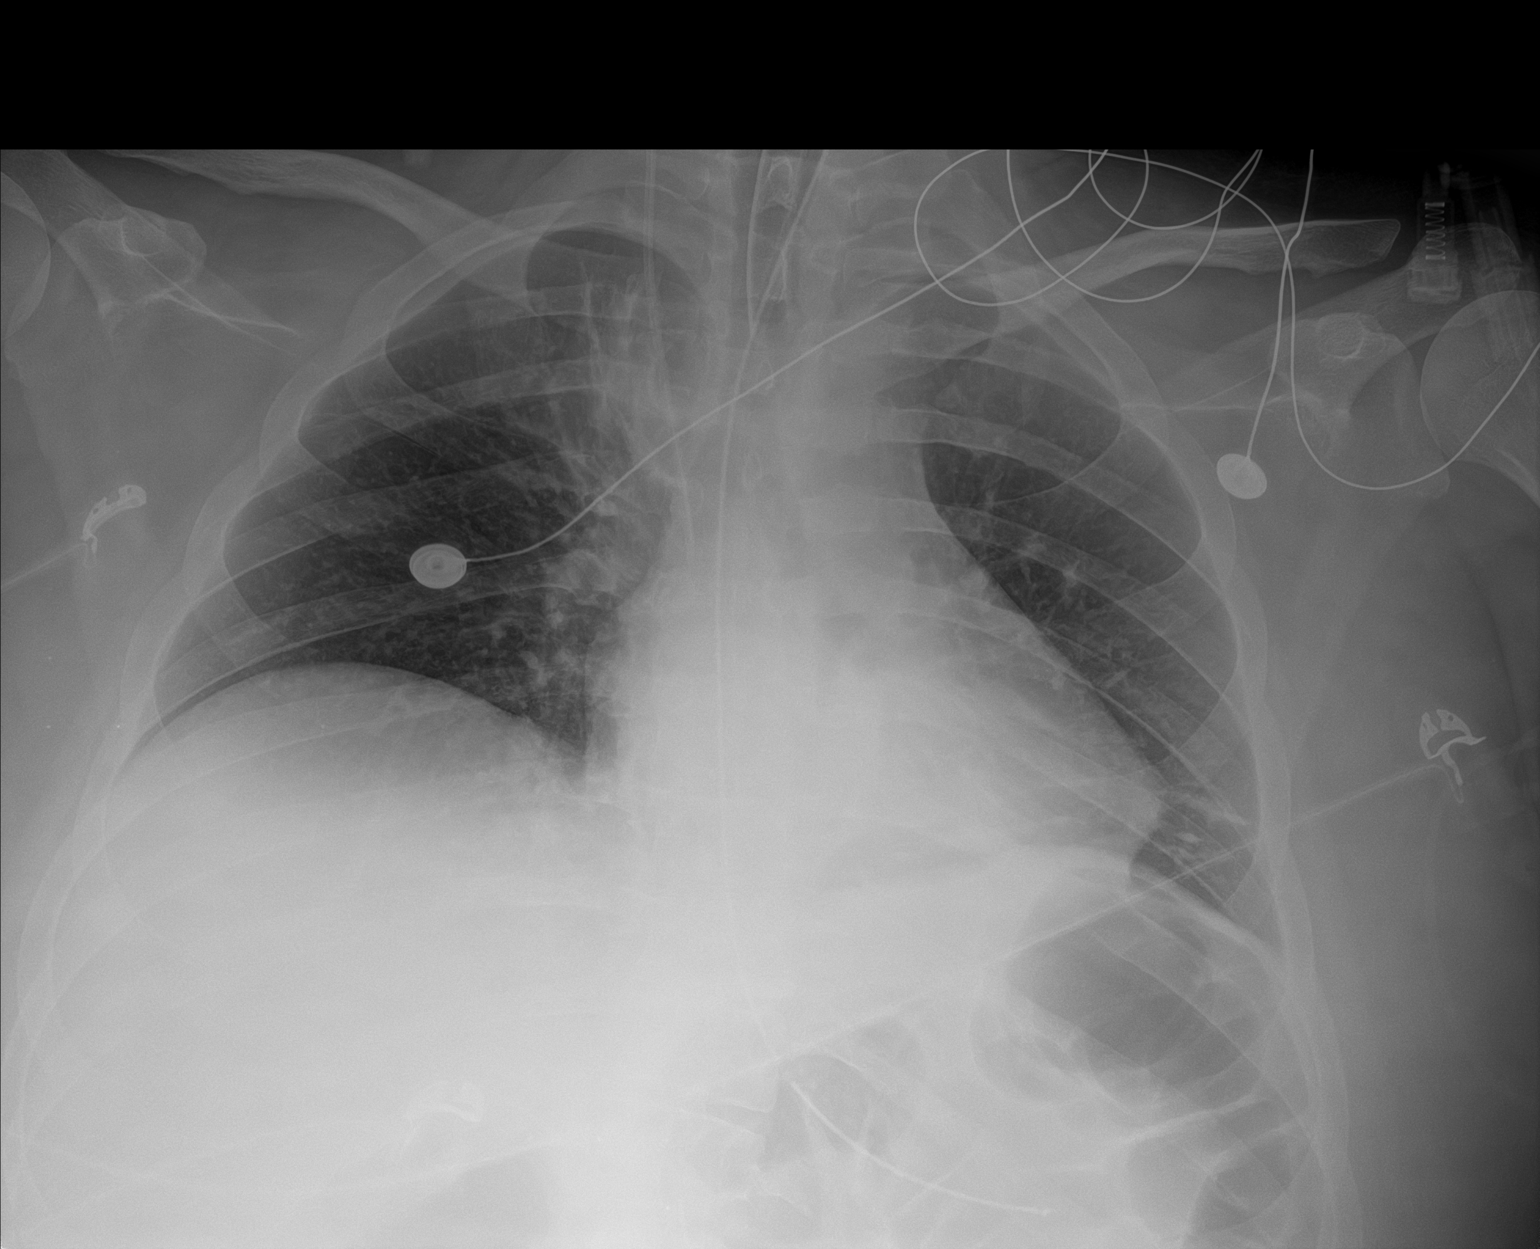

[1 of 1 positions shown; findings below may reference images not displayed]

FINDINGS: An endotracheal tube is in place with tip 4.1 cm above the carina.
There is a right-sided internal jugular central venous catheter with
tip terminating in the distal superior vena cava. Nasogastric tube
extending into the proximal stomach with side port just distal to
the gastroesophageal junction. No pneumothorax. Lung volumes are
low. Ill-defined opacities in the medial aspect of the left lung
base and medial aspect of the right upper lobe. No pleural
effusions. No evidence of pulmonary edema. Heart size is normal.
IMPRESSION: 1. Support apparatus, as above.
2. Ill-defined opacities in the medial left lower lobe and medial
right upper lobe concerning for multilobar pneumonia.

## 2018-12-21 IMAGING — DX DG ABD PORTABLE 1V
1 series · 1 of 1 positions shown · non-contrast
Comparison: [DATE].

CLINICAL DATA: Nasogastric tube placement.

EXAM:
PORTABLE ABDOMEN - 1 VIEW

[abdomen kub]
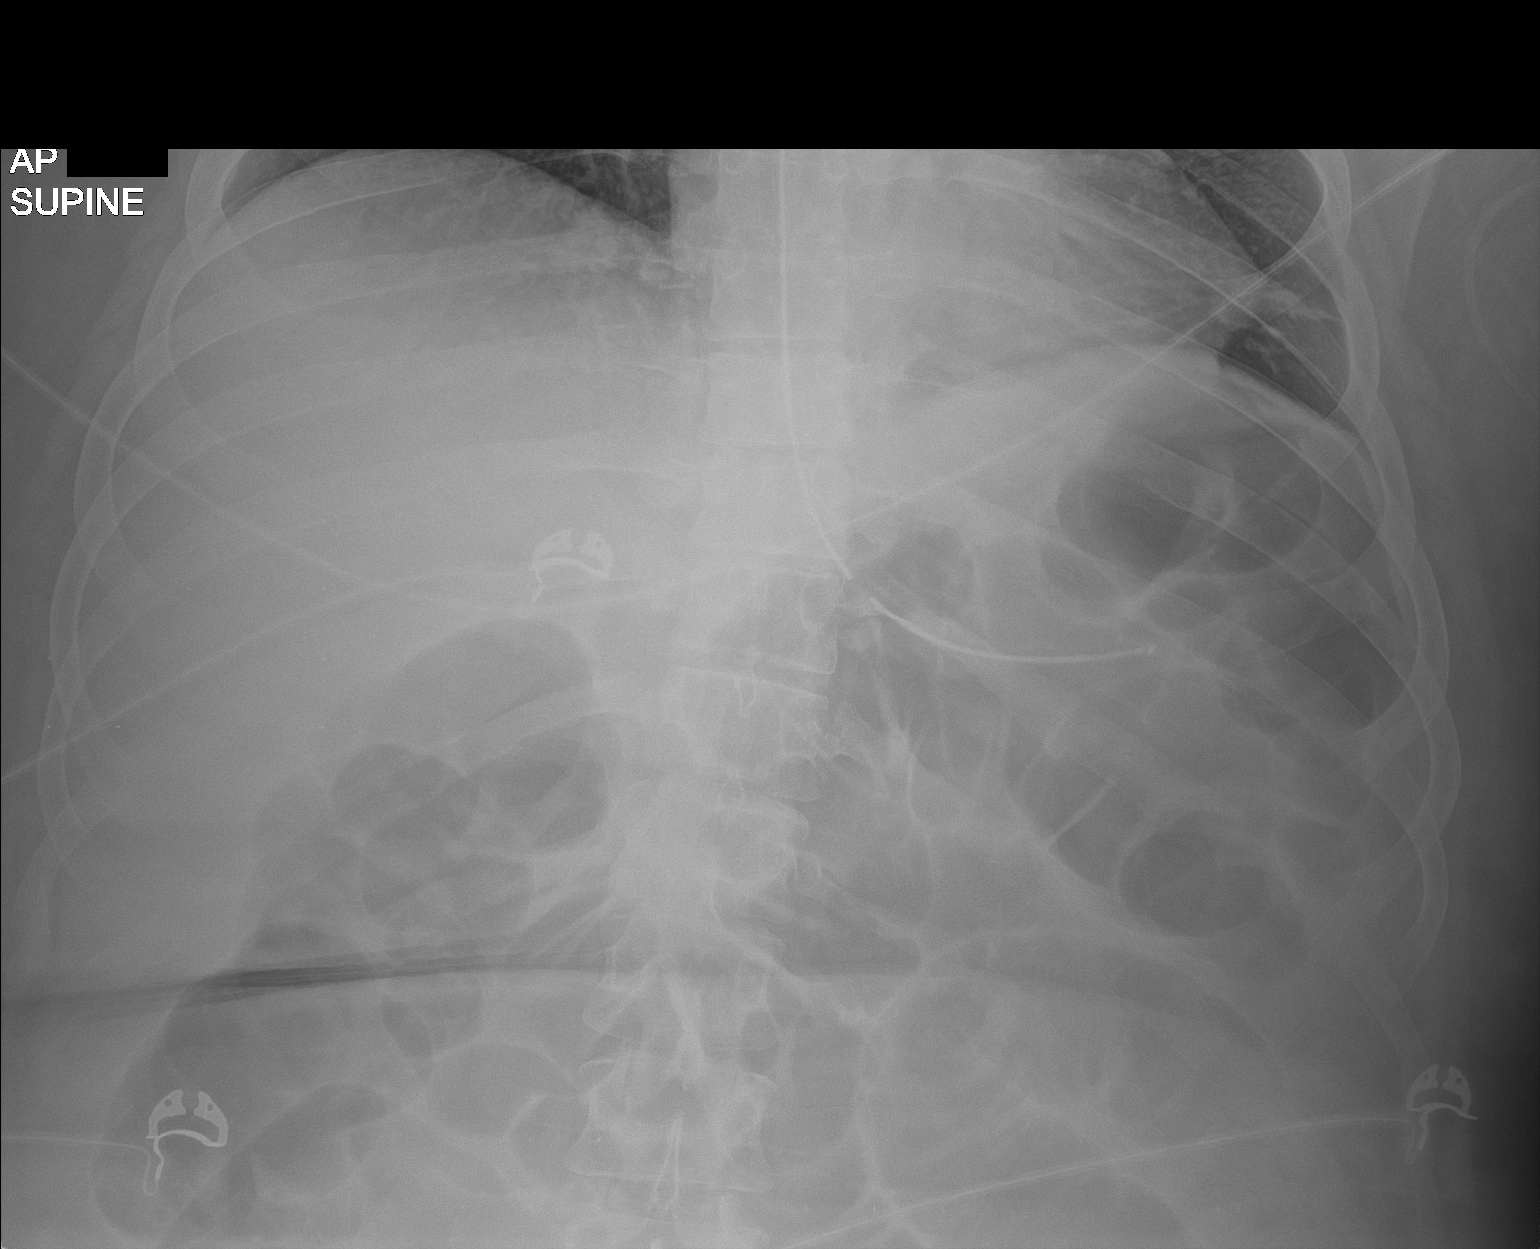

[1 of 1 positions shown; findings below may reference images not displayed]

FINDINGS: Mildly dilated air-filled large and small bowel loops are noted
which may represent ileus. Distal tip of nasogastric tube is seen in
proximal stomach.
IMPRESSION: Distal tip of nasogastric tube seen in proximal stomach. Mildly
dilated air-filled large and small bowel loops are noted which may
represent ileus.

## 2018-12-21 IMAGING — DX DG CHEST 1V PORT
1 series · 1 of 1 positions shown · non-contrast
Comparison: [DATE]

CLINICAL DATA: Aspiration, possible seizure

EXAM:
PORTABLE CHEST 1 VIEW

[chest]
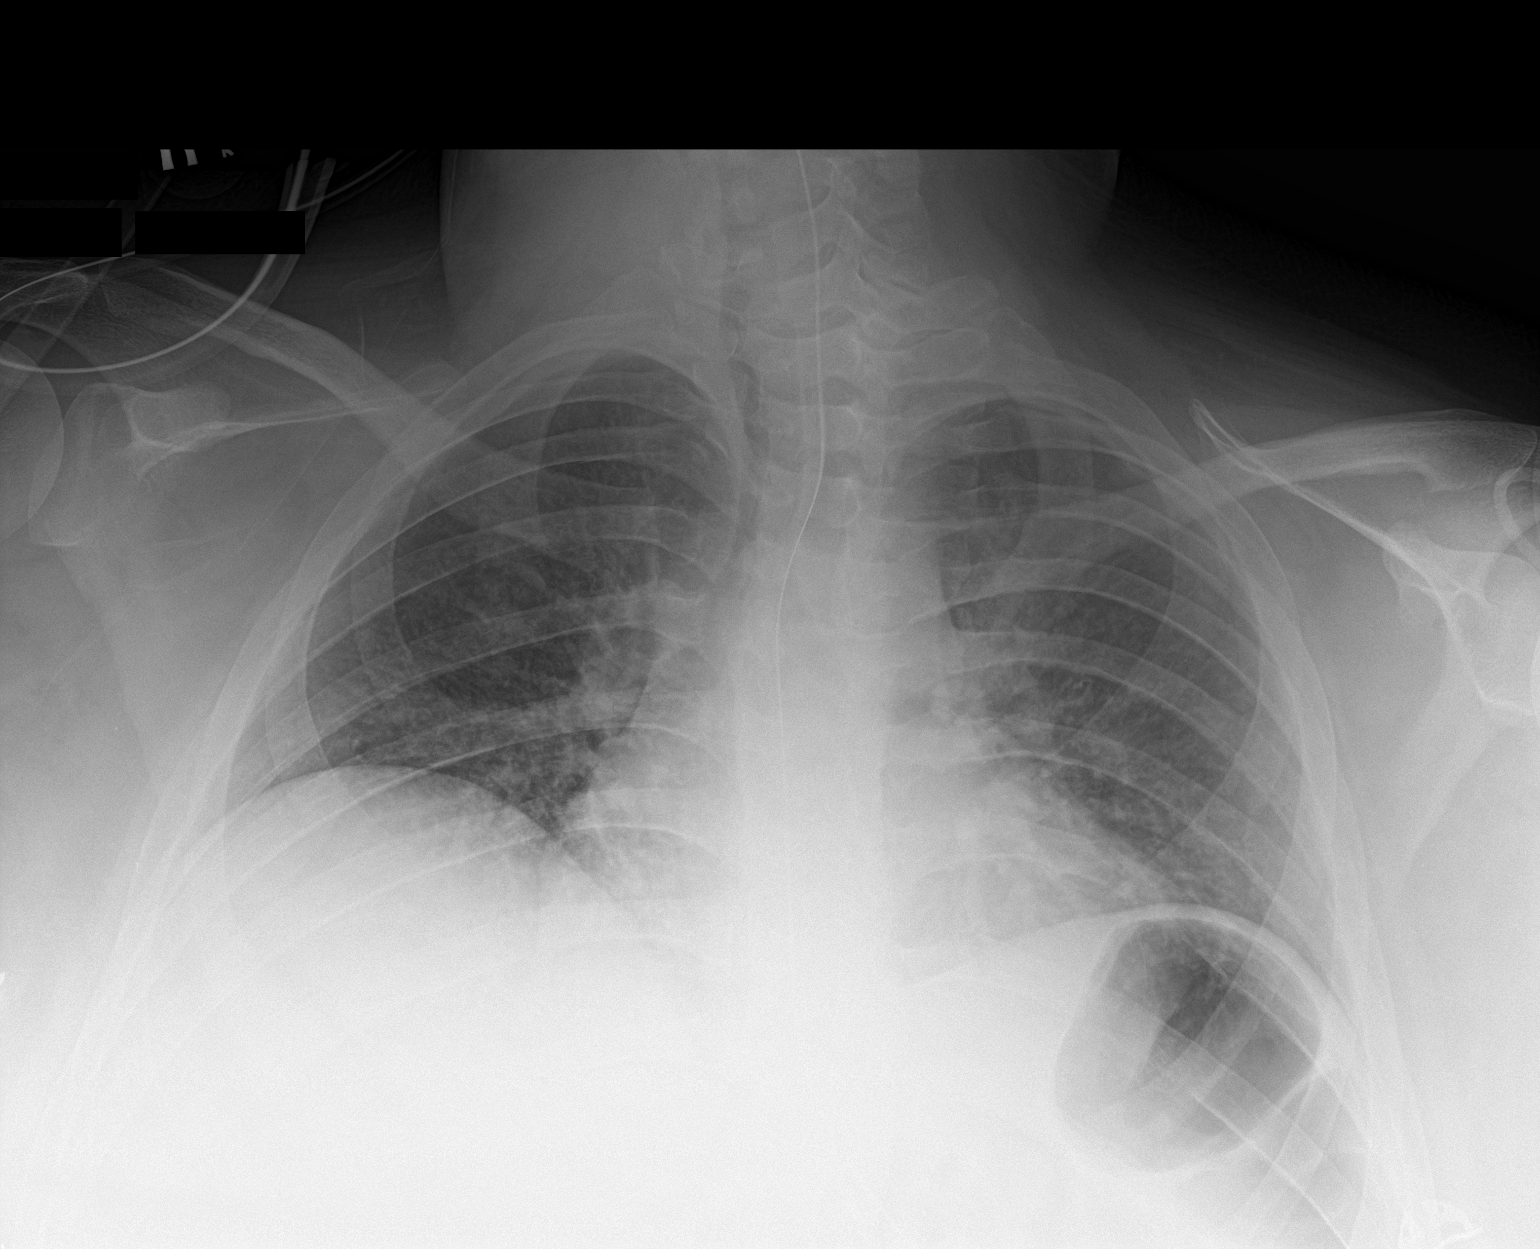

[1 of 1 positions shown; findings below may reference images not displayed]

FINDINGS: No significant change in low volume AP portable examination without
significant acute appearing airspace opacity. Esophagogastric tube
remains with tip and side port below the diaphragm. Heart and
mediastinum are unremarkable.
IMPRESSION: No significant change in low volume portable chest x-ray. No new
findings.

## 2018-12-21 MED ORDER — LIDOCAINE VISCOUS HCL 2 % MT SOLN
6.0000 mL | Freq: Once | OROMUCOSAL | Status: DC
  Filled 2018-12-21: qty 15

## 2018-12-21 MED ORDER — FENTANYL CITRATE (PF) 100 MCG/2ML IJ SOLN
INTRAMUSCULAR | Status: AC
  Administered 2018-12-21: 15:00:00 100 ug via INTRAVENOUS
  Filled 2018-12-21: qty 2

## 2018-12-21 MED ORDER — NOREPINEPHRINE 4 MG/250ML-% IV SOLN
INTRAVENOUS | Status: AC
  Administered 2018-12-21: 18:00:00 15 ug/min via INTRAVENOUS
  Filled 2018-12-21: qty 250

## 2018-12-21 MED ORDER — ETOMIDATE 2 MG/ML IV SOLN
20.0000 mg | Freq: Once | INTRAVENOUS | Status: AC
  Administered 2018-12-21: 20 mg via INTRAVENOUS

## 2018-12-21 MED ORDER — ACETAMINOPHEN 650 MG RE SUPP
650.0000 mg | Freq: Four times a day (QID) | RECTAL | Status: DC
  Administered 2018-12-21 (×2): 650 mg via RECTAL
  Filled 2018-12-21 (×2): qty 1

## 2018-12-21 MED ORDER — NOREPINEPHRINE 4 MG/250ML-% IV SOLN
0.0000 ug/min | INTRAVENOUS | Status: DC
  Administered 2018-12-21: 12 ug/min via INTRAVENOUS
  Administered 2018-12-21: 18:00:00 15 ug/min via INTRAVENOUS
  Administered 2018-12-22: 4 ug/min via INTRAVENOUS
  Administered 2018-12-22: 9 ug/min via INTRAVENOUS
  Administered 2018-12-22: 12 ug/min via INTRAVENOUS
  Administered 2018-12-23: 4 ug/min via INTRAVENOUS
  Administered 2018-12-23: 13 ug/min via INTRAVENOUS
  Filled 2018-12-21 (×6): qty 250

## 2018-12-21 MED ORDER — ENOXAPARIN SODIUM 40 MG/0.4ML ~~LOC~~ SOLN
40.0000 mg | Freq: Two times a day (BID) | SUBCUTANEOUS | Status: DC
  Administered 2018-12-21 – 2018-12-31 (×22): 40 mg via SUBCUTANEOUS
  Filled 2018-12-21 (×23): qty 0.4

## 2018-12-21 MED ORDER — CHLORHEXIDINE GLUCONATE 0.12% ORAL RINSE (MEDLINE KIT)
15.0000 mL | Freq: Two times a day (BID) | OROMUCOSAL | Status: DC
  Administered 2018-12-21 – 2019-01-26 (×70): 15 mL via OROMUCOSAL

## 2018-12-21 MED ORDER — ALBUMIN HUMAN 25 % IV SOLN
25.0000 g | Freq: Once | INTRAVENOUS | Status: AC
  Administered 2018-12-21: 25 g via INTRAVENOUS

## 2018-12-21 MED ORDER — DEXMEDETOMIDINE HCL IN NACL 400 MCG/100ML IV SOLN
0.4000 ug/kg/h | INTRAVENOUS | Status: DC
  Administered 2018-12-22 – 2018-12-24 (×7): 0.4 ug/kg/h via INTRAVENOUS
  Filled 2018-12-21 (×7): qty 100

## 2018-12-21 MED ORDER — SODIUM CHLORIDE 0.9 % IV BOLUS
500.0000 mL | Freq: Once | INTRAVENOUS | Status: AC
  Administered 2018-12-21: 500 mL via INTRAVENOUS

## 2018-12-21 MED ORDER — AMIODARONE HCL IN DEXTROSE 360-4.14 MG/200ML-% IV SOLN
INTRAVENOUS | Status: AC
  Filled 2018-12-21: qty 200

## 2018-12-21 MED ORDER — LEVETIRACETAM IN NACL 1000 MG/100ML IV SOLN
1000.0000 mg | Freq: Once | INTRAVENOUS | Status: AC
  Administered 2018-12-21: 1000 mg via INTRAVENOUS
  Filled 2018-12-21 (×2): qty 100

## 2018-12-21 MED ORDER — MIDAZOLAM HCL 2 MG/2ML IJ SOLN
INTRAMUSCULAR | Status: AC
  Administered 2018-12-21: 2 mg
  Filled 2018-12-21: qty 4

## 2018-12-21 MED ORDER — VITAL 1.5 CAL PO LIQD
1000.0000 mL | ORAL | Status: DC
  Filled 2018-12-21 (×5): qty 1000

## 2018-12-21 MED ORDER — LORAZEPAM 2 MG/ML IJ SOLN
1.0000 mg | Freq: Once | INTRAMUSCULAR | Status: AC
  Administered 2018-12-21: 1 mg via INTRAVENOUS

## 2018-12-21 MED ORDER — LACTATED RINGERS IV BOLUS
1000.0000 mL | Freq: Once | INTRAVENOUS | Status: AC
  Administered 2018-12-21: 13:00:00 1000 mL via INTRAVENOUS

## 2018-12-21 MED ORDER — THIAMINE HCL 100 MG/ML IJ SOLN
100.0000 mg | Freq: Every day | INTRAMUSCULAR | Status: DC
  Administered 2018-12-21 – 2019-01-11 (×22): 100 mg via INTRAVENOUS
  Filled 2018-12-21 (×23): qty 2

## 2018-12-21 MED ORDER — IOHEXOL 300 MG/ML  SOLN: 50.0000 mL | Freq: Once | INTRAMUSCULAR | Status: DC | PRN

## 2018-12-21 MED ORDER — FENTANYL CITRATE (PF) 100 MCG/2ML IJ SOLN
25.0000 ug | INTRAMUSCULAR | Status: DC | PRN
  Administered 2018-12-21: 15:00:00 100 ug via INTRAVENOUS
  Administered 2019-01-09: 25 ug via INTRAVENOUS
  Filled 2018-12-21 (×2): qty 2

## 2018-12-21 MED ORDER — ORAL CARE MOUTH RINSE
15.0000 mL | OROMUCOSAL | Status: DC
  Administered 2018-12-21 – 2018-12-24 (×27): 15 mL via OROMUCOSAL

## 2018-12-21 MED ORDER — PRO-STAT SUGAR FREE PO LIQD
60.0000 mL | Freq: Four times a day (QID) | ORAL | Status: DC
  Administered 2018-12-21 – 2018-12-25 (×15): 60 mL
  Filled 2018-12-21 (×16): qty 60

## 2018-12-21 MED ORDER — FENTANYL 2500MCG IN NS 250ML (10MCG/ML) PREMIX INFUSION
0.0000 ug/h | INTRAVENOUS | Status: DC
  Administered 2018-12-21: 50 ug/h via INTRAVENOUS
  Filled 2018-12-21 (×2): qty 250

## 2018-12-21 MED ORDER — NEOSTIGMINE METHYLSULFATE 10 MG/10ML IV SOLN
0.5000 mg | Freq: Three times a day (TID) | INTRAVENOUS | Status: DC
  Administered 2018-12-21 – 2018-12-23 (×6): 0.5 mg via SUBCUTANEOUS
  Filled 2018-12-21 (×11): qty 0.5

## 2018-12-21 MED ORDER — ROCURONIUM BROMIDE 50 MG/5ML IV SOLN
50.0000 mg | Freq: Once | INTRAVENOUS | Status: AC
  Administered 2018-12-21: 50 mg via INTRAVENOUS

## 2018-12-21 NOTE — Progress Notes (Signed)
.-  Patient has been having mini bouts of seizures that lasts for 5 secs. - Ativan 1 mg given PRN, Dr. Maylene Roes aware and Rapid response made aware.  @1035  Ativan 1 mg given STAT given as ordered by Dr. Maylene Roes @ 1039 Dr. Leonel Ramsay came and saw the patient to increase Keppra and EEG STAT.  @ 1050 Rapid Response RN came and assessed pt.  -Patient vomited 1.2 ml of gastric output. -CXR done -Patient to transfer to 4N ICU rm 25. -Report given to Primary RN Beacher May.

## 2018-12-21 NOTE — Progress Notes (Addendum)
NAME:  Aaron Vega, MRN:  [id number], DOB:  05-15-1995, LOS: 13 ADMISSION DATE:  2019/01/06, CONSULTATION DATE:  12/10/2018 REFERRING MD:  Earnest Conroy - TR[facility], CHIEF COMPLAINT:  Rising lactate level.  Brief History   23 year old autistic man with abdominal distension and vomiting.  History of present illness   Known for poor and declining health status from severe autism with seizures, severe constipation recently.  His QoL was significantly better prior to 08/2018 when he suffered a severe seizure at home and struck his head. Seen in ED only but has has worsening mental status since. Per chart, this was ascribed to  hypoxic ischemic encephalopathy following prolonged seizure.   Presented via ED for increasing lethargy and vomiting and decreased urine output.   Significant disconnect regarding goals of care as currently receiving hospice care at home but still full code and mother not yet willing to place limits on care.  Was unable to have MRI performed 12/12/2018 secondary to agitation and risk of respiratory depression with sedation  Post lumbar puncture 12/14/2018  Past Medical History   Past Medical History:  Diagnosis Date  . Autistic disorder, current or active state   . Fatty liver   . Hypertension   . Obesity   . Pancreatitis    at age 77 years  . Pneumonia   . Pre-diabetes   . Seizures ([place])    as of 11/23/15 - no seizures for 3 month  . Sleep apnea    not on cpap (can't tolerate)  . Tics of organic origin   . Tourette syndrome    Past Surgical History:  Procedure Laterality Date  . BIOPSY  11/08/2018   Procedure: BIOPSY;  Surgeon: Jerene Bears, MD;  Location: [facility];  Service: Gastroenterology;;  . ESOPHAGOGASTRODUODENOSCOPY (EGD) WITH PROPOFOL N/A 11/08/2018   Procedure: ESOPHAGOGASTRODUODENOSCOPY (EGD) WITH PROPOFOL;  Surgeon: Jerene Bears, MD;  Location: [facility];  Service: Gastroenterology;  Laterality: N/A;  . FLEXIBLE SIGMOIDOSCOPY N/A 11/21/2018    Procedure: FLEXIBLE SIGMOIDOSCOPY;  Surgeon: Irving Copas., MD;  Location: [facility];  Service: Gastroenterology;  Laterality: N/A;  . IMPACTION REMOVAL  11/21/2018   Procedure: IMPACTION REMOVAL;  Surgeon: Irving Copas., MD;  Location: [facility];  Service: Gastroenterology;;  . RADIOLOGY WITH ANESTHESIA N/A 11/24/2015   Procedure: CT SCAN ABDOMEN AND PELVIS WITH CONTRAST;  Surgeon: Medication Radiologist, MD;  Location: [facility];  Service: Radiology;  Laterality: N/A;  . TONSILLECTOMY     and adenoidectomy    Significant Hospital Events   11/14 admission to [facility]. 11/15 increasing lactate despite fluids. 12/10/2018 transfer to intensive care unit 12/15/2018 transferred to medical floor 12/21/2018 transferred back to ICU  Consults:  PCCM 11/16. Urology 12/10/2018 Neurology  Procedures:  11/09/2018 NG tube placed per interventional radiology 12/14/2018 lumbar puncture  Significant Diagnostic Tests:  11/14 - CT abdomen/pelvis: obese, small LLL infiltrate, dilated gas filled loops of small and large bowel to rectum, no transition. No gastric distension, moderate stool burden in ascending colon. 12/10/2018 MRI brain cervical vertebrae with abnormal T2 signal restricted diffusion both medial thalami most likely diagnosis of Warnicke's encephalopathy  Micro Data:  SARS-Cov2 -negative Blood cultures -no growth to date Urine culture - negative  Antimicrobials:  Cefepime 11/14 - 11/18 Flagyl 11/14-11/18  Interim history/subjective:  Witnessed aspiration and possible seizure like activity.  Patient on NRB.  Mother at bedside.  To come back to ICU.  Objective   Blood pressure 121/61, pulse (!) 144, temperature (!) 102.5  F (39.2 C), temperature source Oral, resp. rate (!) 22, height 5\' 11"  (1.803 m), weight (!) 140.8 kg, SpO2 97 %.       No intake or output data in the 24 hours ending 12/21/18 1114 Filed Weights   12/17/18 0500 12/18/18 0500 12/21/18 0540   Weight: (!) 136.3 kg (!) 136.9 kg (!) 140.8 kg    Examination: GEN: obese man in moderate resp distress HEENT: NRB in place, tracking with eyes CV: Tachycardic, ext warm PULM: Rattling upper airway sounds, tachypneic GI: Protuberant, hypoactive BS EXT: No edema NEURO: Moves all 4 ext, no seizure activity that I can see PSYCH: Appears anxious SKIN: no rashes, dried vomit on clothes  CXR reviewed, NGT in place, low lung volumes  Resolved Hospital Problem list   Hypernatremia  Assessment & Plan:  # Acute on chronic hypoxemic respiratory failure-  Due to aspiration, weakness, secretion clearance issues.  Underlying OSA intolerant to CPAP. # Ascending weakness and encephalopathy- extensive workup to date with working diagnosis of GBS + Wernicke's.  S/P 5 days IVIG and ongoing thiamine repletion. # Recurrent fevers- also extensive workup to date (duplex, cultures, imaging), to get a CT A/P at some point.  Pct neg 12/09/18.  Question just recurrent aspiration events. 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 Hospice, mother states she needs time to think about but wants all measures done short term.  I note the issues previous providers have had with mother's insight.  Labs   CBC: Recent Labs  Lab 12/18/18 0159 12/20/18 0827 12/21/18 0752  WBC 7.0 6.0 10.2  NEUTROABS 3.8  --   --   HGB 9.9*  9.8* 10.4*  HCT 30.1* 31.3* 32.9*  MCV 87.0 90.7 89.4  PLT 383 440* 669*    Basic Metabolic Panel: Recent Labs  Lab 12/15/18 0436 12/18/18 0159 12/19/18 0857 12/20/18 0223 12/21/18 0752  NA 138 137 137 138 143  K 5.3* 3.4* 3.6 3.9 4.1  CL 100 100 101 103 105  CO2 24 26 26 26 29   GLUCOSE 79 99 99 100* 105*  BUN <5* <5* 6 6 10   CREATININE 0.43* 0.42* 0.39* 0.34* 0.54*  CALCIUM 9.0 9.0 9.4 9.4 9.6  MG 1.8 1.6* 1.7 1.8  --   PHOS 3.5  --   --   --   --     40 minutes CC time 12/23/18 MD PCCM

## 2018-12-21 NOTE — Progress Notes (Addendum)
Subjective: Febrile, slightly sob  Exam: Vitals:   12/21/18 0900 12/21/18 0922  BP: (!) 115/55 121/61  Pulse:  (!) 144  Resp:    Temp:    SpO2:     Gen: In bed, appears slightly agitated Resp: non-labored breathing, no acute distress  Neuro: MS: Eyes open, nonverbal at baseline, does not follow commands CN: Pupils equal round reactive, eyes cross midline in both directions, blinks to threat Motor: He moves his bilateral arms against gravity, but with distal weakness in the hands, in the legs, he has minimal proximal flexion to noxious stimulation bilaterally.  1/5 abduction and adduction  Sensory: He grimaces to noxious stimulation of the lower extremities DTR: Areflexic throughout  Pertinent Labs: CSF -0 WBC -550 RBC -Protein 172 -Glucose 60  Zinc- normal at 120 Serum copper- normal at 122 B12 low at 159 B1-low at 22  Impression: 23 year old male with autism and seizure disorder who presents with an acute to subacute neuropathy over the past 6 weeks. On further discussion with his mother, the distal weakness began FIRST, follwoed by the nausea and vomiting. This, coupled with the elevated CSF protein, makes GBS the more likely cuprit, but his B1 was low at 28 on admission as well, keeping this in the differential. I suspect that the n/v could be due to adynamic ileus from GBS.   He has finished 3 days of high-dose IV thiamine and today is day 5 of IVIG. I do not think, especially this far into symptoms, that further immune treatment beyond IVIG would be indicated. Treatment moving forward will be primarily be physical therapy.   I actually wonder if there may be a multifactorial process including Guillian-Barr as initial agent with B1 deficiency playing a role later in the course.  Recommendations: 1) continue thiamine supplementation 100 mg daily 2) multivitamin 3) at this point I think therapy is the primary intervention, and transitioning to more rehab oriented  approach at this point would be prudent.   Roland Rack, MD [facility] [phone]  If 7pm- 7am, please page neurology on call as listed in [place].  Following my initial evaluation today, he began having nausea and vomiting. During this, he would have intermittent episodes of extension of his arms with eyes rolling upwards. One episode lasted for multiple seconds. He has more pronounced nystagmus now, though this has been noted previously as well. His eyes cross midline in both directions. The movements I witnessed could be brief seizure, but also could represent cough reflex/distress. I think that EEG monitoring would be helpful to determine if these episodes are seizure. Pending this, will give additional 1g keppra.   Roland Rack, MD [facility] [phone]  If 7pm- 7am, please page neurology on call as listed in [place].

## 2018-12-21 NOTE — Procedures (Signed)
Arterial Catheter Insertion Procedure Note Aaron Vega [id number] 10/24/1995  Procedure: Insertion of Arterial Catheter  Indications: Blood pressure monitoring  Procedure Details Consent: Risks of procedure as well as the alternatives and risks of each were explained to the (patient/caregiver).  Consent for procedure obtained. Time Out: Verified patient identification, verified procedure, site/side was marked, verified correct patient position, special equipment/implants available, medications/allergies/relevent history reviewed, required imaging and test results available.  Performed  Maximum sterile technique was used including antiseptics, cap, gloves, gown, hand hygiene, mask and sheet. Skin prep: Chlorhexidine; local anesthetic administered 22 gauge catheter was inserted into right radial artery using the Seldinger technique. ULTRASOUND GUIDANCE USED: YES Evaluation Unable to cannulate   Tyler Robidoux A Taryn Shellhammer 12/21/2018

## 2018-12-21 NOTE — Progress Notes (Signed)
Palliative: Mr. Cottrell, Gentles, is lying in bed, registered dietitian at bedside attempting to place [place].  He appears acutely/chronically ill, and frail.  He continues to cough and clear his throat after episode of aspiration.   He is nonverbal at baseline, and is unable to make his basic needs known at this time.  His mother is at bedside.  It is noted that he had an episode of vomiting/aspiration, seizure, temperature of 104 earlier today.   Mother [name] and I talk in detail about his acute and chronic health concerns, this sudden decline.  [name] asks several times about the severity of Basheer's illness. She expresses surprise when I tell her that Johncharles is "critically ill".   We talk in detail about his VS including HR 150's, RR 30-40's, and low BP systolic 79'G.   I share my concern that Angelus has not had "meaningufl improvements".  We talked about the treatment plan in detail including but not limited to images, labs, medical interventions.  I share that, from my experience and training, Dailan will likely need rehab and then LTC after hospitalization.  I share my concern that Yonis would experience failure to thrive, worsening illness and injury if he were not in [name]'s home.  She agrees, stating that it is not acceptable for York to go to rehab, not live in her home.  I share several times that in my experience, she will not be able to continue to care for Olivet at home.   [name] asked several times about Yaw's ability to recover, asking if he is likely to pass, and if so then when.  We talked about his decline, his fever, aspiration.  I shared that it would not be surprising if he were to pass away within the next week or two.  [name] shares that she is trying to prepare herself.    We talked about CODE STATUS, "treat the treatable but allowing natural passing".  We talked about doing something TO him or FOR him.  At this point, [name] continues to state that  she wants to "do everything to keep him alive".  We talked about future choices and decisions, but [name] shares that she will have to make those choices when the time comes.  That she "cannot play God".   [name] shares that palliative medicine team has been helpful to her.  I shared that we will continue to follow.  Psychosocial support provided with patient, family, nursing staff.  Conference with CCM attending and bedside nursing staff related to goals of care discussion.  Plan:  Continue to treat the treatable, FULL SCOPE/CODE PMT to continue to follow.   60 minutes, extended time.  Quinn Axe, NP Palliative Medicine Team Team Phone # [phone] Greater than 50% of this time was spent counseling and coordinating care related to the above assessment and plan.

## 2018-12-21 NOTE — Progress Notes (Signed)
Worsening distributive shock after aspiration event, respiratory status remains poor, long conversation had by palliative, myself, Dr. Ander Slade and mother/grandmother.  They understand intubating Aaron Vega will at a best case scenario end up with him ending up with trach/PEG in [facility].  They would like to proceed with intubation.  Erskine Emery MD

## 2018-12-21 NOTE — Progress Notes (Signed)
Nutrition Follow-up  DOCUMENTATION CODES:   Severe malnutrition in context of acute illness/injury  INTERVENTION:   Once post pyloric tube in placed Vital 1.5 @ 40 ml/hr 60 ml Prostat QID  Provides: 2240 kcal, 184 grams protein, and 733 ml free water.   Pt has been without adequate nutrition x 12 days. TF had only gotten to 40 ml/hr prior to aspiration event. If tube placement fails or if pt fails TF trial recommend TPN to meet nutrition needs.   NUTRITION DIAGNOSIS:   Severe Malnutrition related to acute illness as evidenced by energy intake < or equal to 50% for > or equal to 5 days, percent weight loss.  Ongoing.   GOAL:   Patient will meet greater than or equal to 90% of their needs  Not met.   MONITOR:   TF tolerance, Diet advancement, Labs, Weight trends  REASON FOR ASSESSMENT:   Consult Enteral/tube feeding initiation and management, Assessment of nutrition requirement/status  ASSESSMENT:   23 yo male with autism and epilepsy with 6 week hx of rapid decline in cognition and gait in addition to new peripheral neuropathic symptoms since the onset of intractable N/V for 6 weeks. Per neurology, findings most consistent with acute thiamine deficiency, pt with B12 deficiency as well. Pt is nonverbal at baseline. Additional PMH includes fatty liver, HTN, pancreatitis, OSA  Pt admitted with ascending weakness and encephalopathy with extensive workup with working dx of GBS and Wernicke's s/p IVIG and ongoing thiamine repletion. Pt with ongoing ileus likely due to GBS.   11/27 pt with sz and aspiration event. Pt vomited. NG to suction with 1.2 L out. Failed cortrak placement. Plan for small bore tube placement in IR this afternoon. After discussion with MD, family and palliative pt intubated.  11/26 TF @ 40 11/25 TF @ 30 11/23 TF started @ 20  Patient is currently intubated on ventilator support MV: 13.9 L/min Temp (24hrs), Avg:101.4 F (38.6 C), Min:99.4 F (37.4 C),  Max:104.3 F (40.2 C)   Medications reviewed and include: erythromycin, mag ox, MVI, miralax, senokot, thiamine, vitamin B12  Diet Order:   Diet Order            Diet NPO time specified  Diet effective now              EDUCATION NEEDS:   Not appropriate for education at this time  Skin:  Skin Assessment: (dermatits: abdomen)  Last BM:  11/27  Height:   Ht Readings from Last 1 Encounters:  12/07/2018 '5\' 11"'  (1.803 m)    Weight:   Wt Readings from Last 1 Encounters:  12/21/18 (!) 140.8 kg    Ideal Body Weight:  78.1 kg  BMI:  Body mass index is 43.28 kg/m.  Estimated Nutritional Needs:   Kcal:  1900-2300  Protein:  156-195 grams  Fluid:  >/= 2 L  Maylon Peppers RD, LDN, CNSC [phone] Pager [phone] After Hours Pager

## 2018-12-21 NOTE — Procedures (Signed)
Central Venous Catheter Insertion Procedure Note Jakevious Hollister [id number] 04-05-1995  Procedure: Insertion of Central Venous Catheter Indications: Drug and/or fluid administration  Procedure Details Consent: Risks of procedure as well as the alternatives and risks of each were explained to the (patient/caregiver).  Consent for procedure obtained. Time Out: Verified patient identification, verified procedure, site/side was marked, verified correct patient position, special equipment/implants available, medications/allergies/relevent history reviewed, required imaging and test results available.  Performed  Maximum sterile technique was used including antiseptics, cap, gloves, gown, hand hygiene, mask and sheet. Skin prep: Chlorhexidine; local anesthetic administered A antimicrobial bonded/coated triple lumen catheter was placed in the right internal jugular vein using the Seldinger technique.  Evaluation Blood flow good Complications: No apparent complications Patient did tolerate procedure well. Chest X-ray ordered to verify placement.  CXR: pending.  Jacek Colson A Elowyn Raupp 12/21/2018, 3:43 PM

## 2018-12-21 NOTE — Plan of Care (Signed)

## 2018-12-21 NOTE — Progress Notes (Signed)
Portable EEG completed, results pending. LTM intitated following portable EEG.  Pt event button tested, RN educated regarding such. No skin break down noted at set-up.

## 2018-12-21 NOTE — Progress Notes (Signed)
eLink Physician-Brief Progress Note Patient Name: Aaron Vega DOB: 05/29/1995 MRN: [id number]   Date of Service  12/21/2018  HPI/Events of Note  HR persistently 140s-150s the whole day, febrile 102 being given Tylenol, on LR 100 cc/hr and given albumin  eICU Interventions   Will give a trial of NS bolus 500 cc as likely with significant insensible losses due to fever an vent  Physical cooling being done and limiting acetaminophen to  2g/24 hour due to slightly elevated liver enzymes     Intervention Category Intermediate Interventions: Arrhythmia - evaluation and management  Shona Needles Cristy Colmenares 12/21/2018, 8:21 PM

## 2018-12-21 NOTE — Significant Event (Signed)
Rapid Response Event Note  Overview: Time Called: 1021 Arrival Time: 1030 Event Type: Neurologic  Initial Focused Assessment: Per RN patient with seizure activity this am, 1mg  Ativan given IV. Upon my arrival he is responsive (non verbal at baseline)  Nystagmus. Dr Leonel Ramsay and Dr Maylene Roes at bedside. Mother at bedside  Per RN TF has been turned off. Patient began vomiting yellow/orange gastric contents.  NG placed to suction 1.2L output. Oral suctioned Patient visibly aspirated.  Very poor cough effort.  BP 104/56  ST 140  RR 46 O2 sats initially 92% He is diaphoretic  Interventions: Additional Keppra ordered Desat to 88% on [place]  Placed on NRB O2 sats 96% PCXR done Dr Tamala Julian with CCM consulted and at bedside Lung sounds with Rhonchi/coarse, decreased bases  Transported to ICU  4N25  ICU staff at bedside upon arrival Continuous EEG placed Rectal temp 104. ABG drawn       Plan of Care (if not transferred):  Event Summary: Name of Physician Notified: Dr Maylene Roes at bedside at    Name of Consulting Physician Notified: Dr Leonel Ramsay at 1201  Outcome: Transferred (Comment)  Event End Time: 1205  Raliegh Ip

## 2018-12-21 NOTE — Progress Notes (Signed)
  PROGRESS NOTE  Called by RN regarding concern for seizure. Per report, patient had 2 episodes of stiffening his upper extremities, eyes rolling back, then slumping over afterward, lasting < 1 minute. He was given 1mg  Ativan. On my exam, patient is tachycardic, tachypneic, satting low 90s on 4L [place] O2. Patient had movements of upper extremity stiffening lasting about < 5 seconds. Given another 1mg  Ativan. Neurology also at bedside to evaluate, could be seizure or cough reflex or distress. Patient soon had episode of vomiting, concern for aspiration. NGT placed on suction and had 1L gastric output.   Continuous EEG ordered Stat CXR  Placed on nonrebreather PCCM consulted Transfer to ICU   Discussed with mom at bedside and dad over the phone. Patient full code currently and mom wants full scope of care at this time, including mechanical ventilation if required.   Total critical care time: 41 minutes. Time 10:30am-11:11am Critical care time was exclusive of separately billable procedures and treating other patients. Critical care was necessary to treat or prevent imminent or life-threatening deterioration. Critical care was time spent personally by me on the following activities: development of treatment plan with patient and/or surrogate as well as nursing, discussions with consultants, evaluation of patient's response to treatment, examination of patient, obtaining history from patient or surrogate, ordering and performing treatments and interventions, ordering and review of laboratory studies, ordering and review of radiographic studies, pulse oximetry and re-evaluation of patient's condition.     Aaron Phi, DO [facility] 12/21/2018, 10:59 AM  Available via Epic secure chat 7am-7pm After these hours, please refer to coverage provider listed on [email or web address]

## 2018-12-21 NOTE — Progress Notes (Signed)
Cortrak Tube Team Note:  Consult received to place a Cortrak feeding tube.   RD attempted to pass tube multiple times without success. Plan fluoroscopy placement this afternoon.   Mariana Single RD, LDN Clinical Nutrition Pager # [phone]

## 2018-12-21 NOTE — Progress Notes (Signed)
PROGRESS NOTE    Aaron Vega  HBZ:[id number] DOB: [birth year] DOA: 11/27/2018 [name]     Brief Narrative:  Aaron Vega is a 23 yo male with past medical history significant for severe autism with seizures, nonverbal at baseline who presented with declining health.  He suffered a severe seizure at home and struck his head in August 2020 and has been worsening in mentation since.  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  He presented to the emergency department for increasing lethargy, vomiting and decreased urine output.  He had been previously enrolled in hospice at home, but mother has now revoked hospice.   New events last 24 hours / Subjective: Continues 102.7 overnight, continues to be tachycardic. No other change per mom at bedside, she's not sure if he has any pain. Patient with loose BM this morning   Assessment & Plan:   Principal Problem:   Severe sepsis (HCC) Active Problems:   Autistic disorder   Intractable nausea and vomiting   Seizure disorder (HCC)   Constipation in male   Obesity, Class III, BMI 40-49.9 (morbid obesity) (HCC)   Ileus (HCC)   Aspiration pneumonia (HCC)   Community acquired pneumonia of left lower lobe of lung   Goals of care, counseling/discussion   Palliative care by specialist   DNR (do not resuscitate) discussion   Protein-calorie malnutrition, severe   Weakness of lower extremity    Acute on chronic respiratory failure Obstructive sleep apnea history, not tolerant of positive pressure ventilation Sepsis secondary to aspiration pneumonia -S/p Cefepime, flagyl x 3 days -Continue aspiration precautions  Fevers -Continues to have fevers. He has now been off of antibiotics for about a week -Chest x-ray without acute cardiopulmonary disease -Blood cultures pending -Urinalysis negative for UTI -DVT ultrasound negative for DVT  bilaterally -WBC remains normal -Tylenol and ibuprofen for fevers as needed -No skin wounds or ulcers per RN and mom report -Obtain CT abdomen pelvis to rule out intra-abdominal source of infection -GI PCR ordered.  Not candidate for C. difficile testing due to receiving laxative recently  -Wells criteria 3, intermediate risk of PE.  He does have fevers, tachycardia and requiring nasal cannula O2.  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 Vit B12 159. Continue thiamine, B12, MV  -Continue Keppra, Onfi   Concern for [employer] -Completed 5 days of IVIG  Ileus -Continue erythromycin -Having BM  Hypothyroidism -Continue synthroid   HTN -Continue lisinopril, metoprolol   Elevated liver enzymes -Trend LFT, improving    DVT prophylaxis: Subcutaneous heparin Code Status: Full code Family Communication: Mother at bedside Disposition Plan: Continue tube feedings for now.  Work-up for fever in process   Consultants:   PCCM  Neurology  Palliative care   Antimicrobials:  Anti-infectives (From admission, onward)   Start     Dose/Rate Route Frequency Ordered Stop   12/19/18 1400  erythromycin (EES) 400 MG/5ML suspension 500 mg     500 mg Per Tube Every 8 hours 12/19/18 1132     12/18/18 1500  erythromycin 500 mg in sodium chloride 0.9 % 100 mL IVPB  Status:  Discontinued     500 mg 100 mL/hr over 60 Minutes Intravenous Every 8 hours 12/18/18 1449 12/19/18 1132   12/15/18 1400  erythromycin (EES) 400 MG/5ML suspension 400 mg  Status:  Discontinued  400 mg Per Tube Every 8 hours 12/15/18 1012 12/15/18 1130   12/15/18 1400  erythromycin 500 mg in sodium chloride 0.9 % 100 mL IVPB  Status:  Discontinued     500 mg 100 mL/hr over 60 Minutes Intravenous Every 8 hours 12/15/18 1130 12/18/18 1449   12/09/18 0600   vancomycin (VANCOCIN) 1,250 mg in sodium chloride 0.9 % 250 mL IVPB  Status:  Discontinued     1,250 mg 166.7 mL/hr over 90 Minutes Intravenous Every 12 hours 12/04/2018 1539 12/10/18 1043   12/09/18 0000  ceFEPIme (MAXIPIME) 2 g in sodium chloride 0.9 % 100 mL IVPB  Status:  Discontinued     2 g 200 mL/hr over 30 Minutes Intravenous Every 8 hours 12/05/2018 1539 12/11/18 1801   12/09/18 0000  metroNIDAZOLE (FLAGYL) IVPB 500 mg  Status:  Discontinued     500 mg 100 mL/hr over 60 Minutes Intravenous Every 8 hours 11/27/2018 2146 12/11/18 1801   11/29/2018 1415  ceFEPIme (MAXIPIME) 2 g in sodium chloride 0.9 % 100 mL IVPB     2 g 200 mL/hr over 30 Minutes Intravenous  Once 12/22/2018 1411 11/27/2018 1538   12/21/2018 1415  metroNIDAZOLE (FLAGYL) IVPB 500 mg     500 mg 100 mL/hr over 60 Minutes Intravenous  Once 12/21/2018 1411 12/23/2018 1609   11/29/2018 1415  vancomycin (VANCOCIN) IVPB 1000 mg/200 mL premix  Status:  Discontinued     1,000 mg 200 mL/hr over 60 Minutes Intravenous  Once 12/09/2018 1411 11/26/2018 1414   12/11/2018 1415  vancomycin (VANCOCIN) 2,500 mg in sodium chloride 0.9 % 500 mL IVPB     2,500 mg 250 mL/hr over 120 Minutes Intravenous  Once 12/20/2018 1414 12/20/2018 1710       Objective: Vitals:   12/21/18 0540 12/21/18 0754 12/21/18 0900 12/21/18 0922  BP: (!) 113/47 110/69 (!) 115/55 121/61  Pulse:  (!) 137  (!) 144  Resp: (!) 32 (!) 22    Temp:  (!) 102.5 F (39.2 C)    TempSrc:  Oral    SpO2:  97%    Weight: (!) 140.8 kg     Height:       No intake or output data in the 24 hours ending 12/21/18 1003 Filed Weights   12/17/18 0500 12/18/18 0500 12/21/18 0540  Weight: (!) 136.3 kg (!) 136.9 kg (!) 140.8 kg    Examination: General exam: Appears calm  Respiratory system: Clear to auscultation.  Tachypneic Cardiovascular system: S1 & S2 heard, tachycardic, regular rhythm. No pedal edema. Gastrointestinal system: Abdomen is nondistended, soft, grimaces to palpation lower  abdomen Central nervous system: Alert.  Nonverbal at baseline Extremities: Symmetric in appearance bilaterally  Skin: No rashes, lesions or ulcers on exposed skin     Data Reviewed: I have personally reviewed following labs and imaging studies  CBC: Recent Labs  Lab 12/18/18 0159 12/20/18 0827 12/21/18 0752  WBC 7.0 6.0 10.2  NEUTROABS 3.8  --   --   HGB 9.9* 9.8* 10.4*  HCT 30.1* 31.3* 32.9*  MCV 87.0 90.7 89.4  PLT 383 440* 669*   Basic Metabolic Panel: Recent Labs  Lab 12/15/18 0436 12/18/18 0159 12/19/18 0857 12/20/18 0223 12/21/18 0752  NA 138 137 137 138 143  K 5.3* 3.4* 3.6 3.9 4.1  CL 100 100 101 103 105  CO2 GLUCOSE 79 99 99 100* 105*  BUN <5* <5* CREATININE 0.43* 0.42* 0.39* 0.34*  0.54*  CALCIUM 9.0 9.0 9.4 9.4 9.6  MG 1.8 1.6* 1.7 1.8  --   PHOS 3.5  --   --   --   --    GFR: Estimated Creatinine Clearance: 209.7 mL/min (A) (by C-G formula based on SCr of 0.54 mg/dL (L)). Liver Function Tests: Recent Labs  Lab 12/14/18 1729 12/18/18 0159 12/20/18 0223 12/21/18 0752  AST  --  117* 81* 70*  ALT  --  134* 102* 93*  ALKPHOS  --  33* 30* 33*  BILITOT  --  1.1 1.2 1.2  PROT  --  8.2* 9.4* 9.1*  ALBUMIN 3.3* 2.6* 2.5* 2.7*   No results for input(s): LIPASE, AMYLASE in the last 168 hours. No results for input(s): AMMONIA in the last 168 hours. Coagulation Profile: No results for input(s): INR, PROTIME in the last 168 hours. Cardiac Enzymes: No results for input(s): CKTOTAL, CKMB, CKMBINDEX, TROPONINI in the last 168 hours. BNP (last 3 results) No results for input(s): PROBNP in the last 8760 hours. HbA1C: No results for input(s): HGBA1C in the last 72 hours. CBG: Recent Labs  Lab 12/20/18 1601 12/20/18 2016 12/21/18 0024 12/21/18 0421 12/21/18 0755  GLUCAP 104* 99 94 88 97   Lipid Profile: No results for input(s): CHOL, HDL, LDLCALC, TRIG, CHOLHDL, LDLDIRECT in the last 72 hours. Thyroid Function Tests: No  results for input(s): TSH, T4TOTAL, FREET4, T3FREE, THYROIDAB in the last 72 hours. Anemia Panel: No results for input(s): VITAMINB12, FOLATE, FERRITIN, TIBC, IRON, RETICCTPCT in the last 72 hours. Sepsis Labs: No results for input(s): PROCALCITON, LATICACIDVEN in the last 168 hours.  No results found for this or any previous visit (from the past 240 hour(s)).    Radiology Studies: Mr Thoracic Spine W Wo Contrast  Result Date: 12/19/2018 CLINICAL DATA:  Quadriplegia EXAM: MRI THORACIC AND LUMBAR SPINE WITHOUT AND WITH CONTRAST TECHNIQUE: Multiplanar and multiecho pulse sequences of the thoracic and lumbar spine were obtained without and with intravenous contrast. CONTRAST:  31mL GADAVIST GADOBUTROL 1 MMOL/ML IV SOLN COMPARISON:  CT thoracic lumbar 12/14/2018 FINDINGS: MRI THORACIC SPINE FINDINGS Alignment:  Normal. Image quality degraded by moderate to extensive motion. Vertebrae: Negative for fracture or mass. Normal enhancement of the spine. Cord: Limited cord evaluation due to motion. No cord lesion or cord compression identified. Paraspinal and other soft tissues: Negative Disc levels: Negative for disc protrusion or spinal stenosis. MRI LUMBAR SPINE FINDINGS Segmentation:  Normal Motion degraded study. There is progressive motion on the study which becomes more severe on the axial images. Alignment:  Normal Vertebrae:  Negative for fracture or mass. Conus medullaris and cauda equina: Conus extends to the T12-L1 level. Conus and cauda equina appear normal. Paraspinal and other soft tissues: Negative for paraspinous mass or adenopathy. Normal soft tissue enhancement. Disc levels: No significant abnormality at L3-4 or above Mild disc bulging and mild facet degeneration L4-5 and L5-S1. Negative for disc protrusion or stenosis IMPRESSION: Motion degraded study, worse on the thoracic compared to the lumbar spine. No acute thoracic abnormality. Negative for fracture, infection, or spinal stenosis. No  acute lumbar abnormality. Mild disc and facet degeneration L4-5 and L5-S1 without stenosis. Electronically Signed   By: Franchot Gallo M.D.   On: 12/19/2018 12:05   Mr Lumbar Spine W Wo Contrast  Result Date: 12/19/2018 CLINICAL DATA:  Quadriplegia EXAM: MRI THORACIC AND LUMBAR SPINE WITHOUT AND WITH CONTRAST TECHNIQUE: Multiplanar and multiecho pulse sequences of the thoracic and lumbar spine were obtained without and with intravenous  contrast. CONTRAST:  10mL GADAVIST GADOBUTROL 1 MMOL/ML IV SOLN COMPARISON:  CT thoracic lumbar 12/14/2018 FINDINGS: MRI THORACIC SPINE FINDINGS Alignment:  Normal. Image quality degraded by moderate to extensive motion. Vertebrae: Negative for fracture or mass. Normal enhancement of the spine. Cord: Limited cord evaluation due to motion. No cord lesion or cord compression identified. Paraspinal and other soft tissues: Negative Disc levels: Negative for disc protrusion or spinal stenosis. MRI LUMBAR SPINE FINDINGS Segmentation:  Normal Motion degraded study. There is progressive motion on the study which becomes more severe on the axial images. Alignment:  Normal Vertebrae:  Negative for fracture or mass. Conus medullaris and cauda equina: Conus extends to the T12-L1 level. Conus and cauda equina appear normal. Paraspinal and other soft tissues: Negative for paraspinous mass or adenopathy. Normal soft tissue enhancement. Disc levels: No significant abnormality at L3-4 or above Mild disc bulging and mild facet degeneration L4-5 and L5-S1. Negative for disc protrusion or stenosis IMPRESSION: Motion degraded study, worse on the thoracic compared to the lumbar spine. No acute thoracic abnormality. Negative for fracture, infection, or spinal stenosis. No acute lumbar abnormality. Mild disc and facet degeneration L4-5 and L5-S1 without stenosis. Electronically Signed   By: Marlan Palau M.D.   On: 12/19/2018 12:05   Dg Chest Port 1 View  Result Date: 12/20/2018 CLINICAL DATA:   Fever. History of hypertension seizures and pancreatitis. EXAM: PORTABLE CHEST 1 VIEW COMPARISON:  12/16/2018 and older exams. FINDINGS: Lung volumes are low. Atelectasis in the medial lung bases. Lungs otherwise clear. No pleural effusion or pneumothorax. Cardiac silhouette normal in size.  No mediastinal or hilar masses. Nasogastric tube passes below the diaphragm well into the stomach. IMPRESSION: 1. No acute cardiopulmonary disease. Electronically Signed   By: Amie Portland M.D.   On: 12/20/2018 13:39   Dg Abd Portable 1v  Result Date: 12/20/2018 CLINICAL DATA:  NG placement EXAM: PORTABLE ABDOMEN - 1 VIEW COMPARISON:  Radiograph 12/20/2018 FINDINGS: Transesophageal tube tip and side port distal to the GE junction, curling in the left upper quadrant in the region of the gastric body. Atelectatic changes in the otherwise clear lung bases. Gaseous distention of what appears to be the colon without high-grade bowel obstruction. IMPRESSION: 1. Transesophageal tube tip and side port in the region of the gastric body. 2. Gaseous distention of the colon without high-grade bowel obstruction. Electronically Signed   By: Kreg Shropshire M.D.   On: 12/20/2018 02:45   Dg Abd Portable 1v  Result Date: 12/20/2018 CLINICAL DATA:  Check gastric catheter placement EXAM: PORTABLE ABDOMEN - 1 VIEW COMPARISON:  12/20/2018 FINDINGS: Gastric catheter has been advanced with the tip in the stomach. Proximal side port lies at the gastroesophageal junction. This could be advanced several cm. The lungs are clear. IMPRESSION: Gastric catheter as described. The proximal side port is not within the stomach and should be advanced several cm. Electronically Signed   By: Alcide Clever M.D.   On: 12/20/2018 01:50   Dg Abd Portable 1v  Result Date: 12/20/2018 CLINICAL DATA:  NG tube placement EXAM: PORTABLE ABDOMEN - 1 VIEW COMPARISON:  12/16/2018 abdominal radiograph FINDINGS: The side port of the NG tube is in the distal esophagus.  Recommend advancing by 7-10 cm. Lungs are clear. Nonobstructive bowel gas pattern. IMPRESSION: 1. Side-port of the nasogastric tube in the distal esophagus. Recommend advancing by 7-10 cm. 2. No active cardiopulmonary disease. Electronically Signed   By: Deatra Robinson M.D.   On: 12/20/2018 00:41   Vas  [place] Lower Extremity Venous (dvt)  Result Date: 12/20/2018  Lower Venous Study Indications: Edema.  Risk Factors: None identified. Limitations: Body habitus and poor ultrasound/tissue interface. Comparison Study: No prior studies. Performing Technologist: Chanda BusingGregory Collins RVT  Examination Guidelines: A complete evaluation includes B-mode imaging, spectral Doppler, color Doppler, and power Doppler as needed of all accessible portions of each vessel. Bilateral testing is considered an integral part of a complete examination. Limited examinations for reoccurring indications may be performed as noted.  +---------+---------------+---------+-----------+----------+--------------+  RIGHT     Compressibility Phasicity Spontaneity Properties Thrombus Aging  +---------+---------------+---------+-----------+----------+--------------+  CFV       Full            Yes       Yes                                    +---------+---------------+---------+-----------+----------+--------------+  SFJ       Full                                                             +---------+---------------+---------+-----------+----------+--------------+  FV Prox   Full                                                             +---------+---------------+---------+-----------+----------+--------------+  FV Mid    Full                                                             +---------+---------------+---------+-----------+----------+--------------+  FV Distal Full                                                             +---------+---------------+---------+-----------+----------+--------------+  PFV       Full                                                              +---------+---------------+---------+-----------+----------+--------------+  POP       Full            Yes       Yes                                    +---------+---------------+---------+-----------+----------+--------------+  PTV       Full                                                             +---------+---------------+---------+-----------+----------+--------------+  PERO      Full                                                             +---------+---------------+---------+-----------+----------+--------------+   +---------+---------------+---------+-----------+----------+--------------+  LEFT      Compressibility Phasicity Spontaneity Properties Thrombus Aging  +---------+---------------+---------+-----------+----------+--------------+  CFV       Full            Yes       Yes                                    +---------+---------------+---------+-----------+----------+--------------+  SFJ       Full                                                             +---------+---------------+---------+-----------+----------+--------------+  FV Prox   Full                                                             +---------+---------------+---------+-----------+----------+--------------+  FV Mid    Full                                                             +---------+---------------+---------+-----------+----------+--------------+  FV Distal Full                                                             +---------+---------------+---------+-----------+----------+--------------+  PFV       Full                                                             +---------+---------------+---------+-----------+----------+--------------+  POP       Full            Yes       Yes                                    +---------+---------------+---------+-----------+----------+--------------+  PTV       Full                                                              +---------+---------------+---------+-----------+----------+--------------+  PERO      Full                                                             +---------+---------------+---------+-----------+----------+--------------+     Summary: Right: There is no evidence of deep vein thrombosis in the lower extremity. No cystic structure found in the popliteal fossa. Left: There is no evidence of deep vein thrombosis in the lower extremity. No cystic structure found in the popliteal fossa.  *See table(s) above for measurements and observations.    Preliminary       Scheduled Meds:  chlorhexidine  15 mL Mouth Rinse BID   Chlorhexidine Gluconate Cloth  6 each Topical Q0600   cloBAZam  60 mg Per Tube BID   erythromycin  500 mg Per Tube Q8H   feeding supplement (VITAL 1.5 CAL)  1,000 mL Per Tube Q24H   heparin injection (subcutaneous)  5,000 Units Subcutaneous Q8H   levETIRAcetam  1,000 mg Per Tube BID   levothyroxine  25 mcg Per Tube Q0600   lisinopril  20 mg Per NG tube Daily   magnesium oxide  400 mg Per NG tube BID   mouth rinse  15 mL Mouth Rinse q12n4p   metoprolol tartrate  100 mg Per NG tube BID   multivitamin  15 mL Per Tube Daily   nystatin   Topical Daily   pantoprazole sodium  40 mg Per Tube BID   polyethylene glycol  17 g Per Tube BID   sennosides  5 mL Per Tube BID   sodium chloride flush  10-40 mL Intracatheter Q12H   thiamine  100 mg Per Tube Daily   vitamin B-12  250 mcg Oral Daily   Continuous Infusions:  dextrose 5% lactated ringers 1,000 mL (12/19/18 1600)     LOS: 13 days      Time spent: 45 minutes   Noralee Stain, DO [facility] 12/21/2018, 10:03 AM   Available via Epic secure chat 7am-7pm After these hours, please refer to coverage provider listed on [email or web address]

## 2018-12-21 NOTE — Procedures (Signed)
Intubation Procedure Note Landen Knoedler [id number] Jul 06, 1995  Procedure: Intubation Indications: respiratory failure  Procedure Details Consent: Risks of procedure as well as the alternatives and risks of each were explained to the (patient/caregiver).  Consent for procedure obtained. Time Out: Verified patient identification, verified procedure, site/side was marked, verified correct patient position, special equipment/implants available, medications/allergies/relevent history reviewed, required imaging and test results available.  Performed  Maximum sterile technique was used including antiseptics, cap, gloves, gown, hand hygiene and mask.  successfully intubated with size 8 ett  Evaluation Hemodynamic Status: BP stable throughout; O2 sats: stable throughout Patient's Current Condition: stable Complications: No apparent complications Patient did tolerate procedure well. Chest X-ray ordered to verify placement.  CXR: pending.   Algis Lehenbauer A Reather Steller 12/21/2018

## 2018-12-21 NOTE — Progress Notes (Signed)
CRITICAL VALUE ALERT  Critical Value:  Lactic Acid 2.9  Date & Time Notied: 11/27 @ 1121  Provider Notified:   Orders Received/Actions taken:  Awaiting orders  * Result relayed to ICU Primary RN Beacher May to inform Critical Care Physician

## 2018-12-21 NOTE — Progress Notes (Addendum)
A-line attempted twice each by 2 different RTs and attempted via ultrasound by MD without success. Pt is currently hypotensive, RN notified.

## 2018-12-21 NOTE — Procedures (Signed)
Patient Name:Aaron Vega CBU:[id number] Epilepsy Attending:Cassidi Modesitt Barbra Sarks Referring Physician/Provider:Dr Kathrynn Speed Date: 12/21/2018 Duration: 23.02 minutes  Patient history:23 year old male with known epilepsy who was noted to have episode of extension of arm with eyes rolling upwards concerning for seizure.. EEG to evaluate for seizures.   Level of alertness:Awake  AEDs during EEG study:Keppra, Onfi  Technical aspects: This EEG study was done with scalp electrodes positioned according to the 10-20 International system of electrode placement. Electrical activity was acquired at a sampling rate of 500Hz  and reviewed with a high frequency filter of 70Hz  and a low frequency filter of 1Hz . EEG data were recorded continuously and digitally stored.   Description:During awake state, no clear posterior dominant rhythm was seen. EEG showed continuous generalized 5 to 6 Hz theta slowing admixed withan excessive amount of 15 to 18 Hz, 2-3 uV beta activity with irregular morphology distributed symmetrically and diffusely. Hyperventilation and photic stimulation were not performed.  Abnormality -Continuous slow, generalized -Excessive beta, generalized    IMPRESSION: This study is suggestive of mild to moderate diffuse encephalopathy, nonspecific to etiology. No seizures or epileptiform discharges were seen throughout the recording.  The excessive beta activity seen in the background is most likely due to the effect of benzodiazepine and is a benign EEG pattern.     Lauralynn Loeb Barbra Sarks

## 2018-12-21 NOTE — Procedures (Signed)
Endotracheal Intubation Procedure Note  Indication for endotracheal intubation: respiratory failure. Airway Assessment: Mallampati Class: II (hard and soft palate, upper portion of tonsils anduvula visible). Sedation: etomidate, fentanyl and midazolam. 20 etomidate, 2mg  versed, 173mcg fentanyl Paralytic: rocuronium.50mg  Lidocaine: no. Atropine: no. Equipment: Macintosh 3 laryngoscope blade. Cricoid Pressure: no. Number of attempts: 1. ETT location confirmed by by auscultation, by CXR and ETCO2 monitor.  Adewale A Olalere 12/21/2018

## 2018-12-22 ENCOUNTER — Inpatient Hospital Stay (HOSPITAL_COMMUNITY)

## 2018-12-22 DIAGNOSIS — R6520 Severe sepsis without septic shock: Secondary | ICD-10-CM | POA: Diagnosis not present

## 2018-12-22 DIAGNOSIS — A419 Sepsis, unspecified organism: Secondary | ICD-10-CM | POA: Diagnosis not present

## 2018-12-22 LAB — PROCALCITONIN: Procalcitonin: 1.44 ng/mL

## 2018-12-22 LAB — POCT I-STAT 7, (LYTES, BLD GAS, ICA,H+H)
Acid-Base Excess: 5 mmol/L — ABNORMAL HIGH (ref 0.0–2.0)
Bicarbonate: 30.3 mmol/L — ABNORMAL HIGH (ref 20.0–28.0)
Calcium, Ion: 1.27 mmol/L (ref 1.15–1.40)
HCT: 29 % — ABNORMAL LOW (ref 39.0–52.0)
Hemoglobin: 9.9 g/dL — ABNORMAL LOW (ref 13.0–17.0)
O2 Saturation: 100 %
Patient temperature: 39.6
Potassium: 3.9 mmol/L (ref 3.5–5.1)
Sodium: 146 mmol/L — ABNORMAL HIGH (ref 135–145)
TCO2: 32 mmol/L (ref 22–32)
pCO2 arterial: 51.5 mmHg — ABNORMAL HIGH (ref 32.0–48.0)
pH, Arterial: 7.388 (ref 7.350–7.450)
pO2, Arterial: 270 mmHg — ABNORMAL HIGH (ref 83.0–108.0)

## 2018-12-22 LAB — GLUCOSE, CAPILLARY
Glucose-Capillary: 107 mg/dL — ABNORMAL HIGH (ref 70–99)
Glucose-Capillary: 97 mg/dL (ref 70–99)
Glucose-Capillary: 87 mg/dL (ref 70–99)
Glucose-Capillary: 95 mg/dL (ref 70–99)
Glucose-Capillary: 95 mg/dL (ref 70–99)
Glucose-Capillary: 93 mg/dL (ref 70–99)

## 2018-12-22 LAB — COMPREHENSIVE METABOLIC PANEL
ALT: 65 U/L — ABNORMAL HIGH (ref 0–44)
AST: 48 U/L — ABNORMAL HIGH (ref 15–41)
Albumin: 2.7 g/dL — ABNORMAL LOW (ref 3.5–5.0)
Alkaline Phosphatase: 28 U/L — ABNORMAL LOW (ref 38–126)
Anion gap: 7 (ref 5–15)
BUN: 20 mg/dL (ref 6–20)
CO2: 30 mmol/L (ref 22–32)
Calcium: 9.1 mg/dL (ref 8.9–10.3)
Chloride: 107 mmol/L (ref 98–111)
Creatinine, Ser: 0.54 mg/dL — ABNORMAL LOW (ref 0.61–1.24)
GFR calc Af Amer: >60 mL/min (ref >60)
GFR calc non Af Amer: >60 mL/min (ref >60)
Glucose, Bld: 110 mg/dL — ABNORMAL HIGH (ref 70–99)
Potassium: 3.9 mmol/L (ref 3.5–5.1)
Sodium: 144 mmol/L (ref 135–145)
Total Bilirubin: 1.5 mg/dL — ABNORMAL HIGH (ref 0.3–1.2)
Total Protein: 8.2 g/dL — ABNORMAL HIGH (ref 6.5–8.1)

## 2018-12-22 LAB — CBC
HCT: 29.6 % — ABNORMAL LOW (ref 39.0–52.0)
Hemoglobin: 9.1 g/dL — ABNORMAL LOW (ref 13.0–17.0)
MCH: 27.8 pg (ref 26.0–34.0)
MCHC: 30.7 g/dL (ref 30.0–36.0)
MCV: 90.5 fL (ref 80.0–100.0)
Platelets: 607 10*3/uL — ABNORMAL HIGH (ref 150–400)
RBC: 3.27 MIL/uL — ABNORMAL LOW (ref 4.22–5.81)
RDW: 14.9 % (ref 11.5–15.5)
WBC: 11.8 10*3/uL — ABNORMAL HIGH (ref 4.0–10.5)
nRBC: 0 % (ref 0.0–0.2)

## 2018-12-22 IMAGING — CT CT ABD-PELV W/ CM
2 of 4 series · 16 of 46 positions shown, 18 images · IV contrast (Omni 300)
Comparison: [DATE]

CLINICAL DATA: Persistent ileus.

EXAM:
CT ABDOMEN AND PELVIS WITH CONTRAST
TECHNIQUE: Multidetector CT imaging of the abdomen and pelvis was performed
using the standard protocol following bolus administration of
intravenous contrast.
CONTRAST:  100mL OMNIPAQUE IOHEXOL 300 MG/ML  SOLN

[Series 3: a/p w/ 5mm · axial · 0.98mm/px · z∈[+1062,+1612]mm · 13 of 122 slices shown, 15 images]
[im 6/122  soft-tissue]
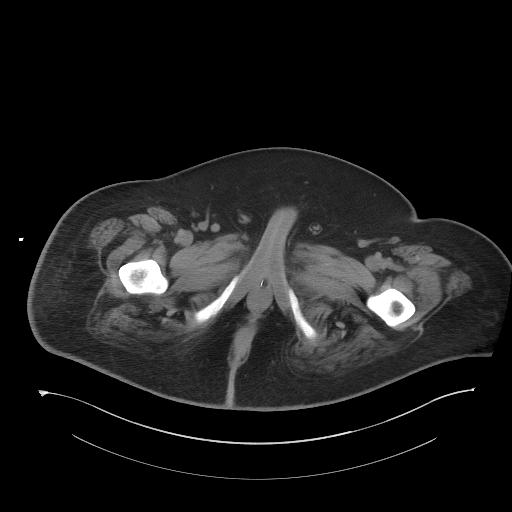
[im 6/122  bone]
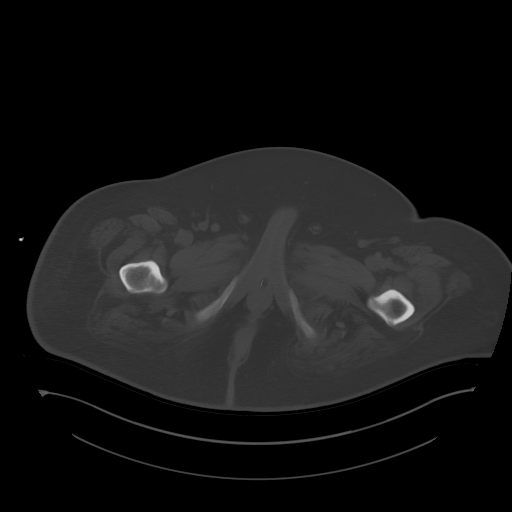
[im 16/122  soft-tissue]
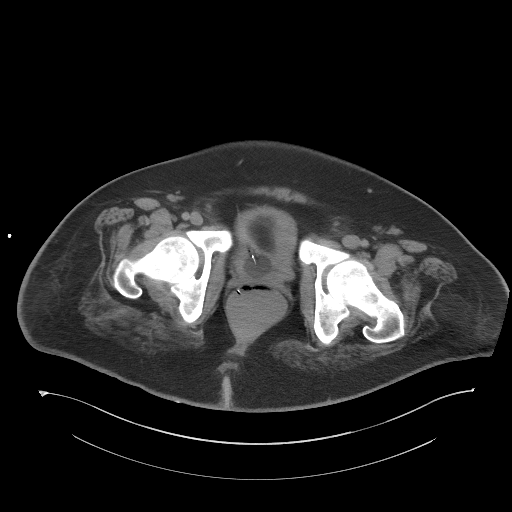
[im 26/122  soft-tissue]
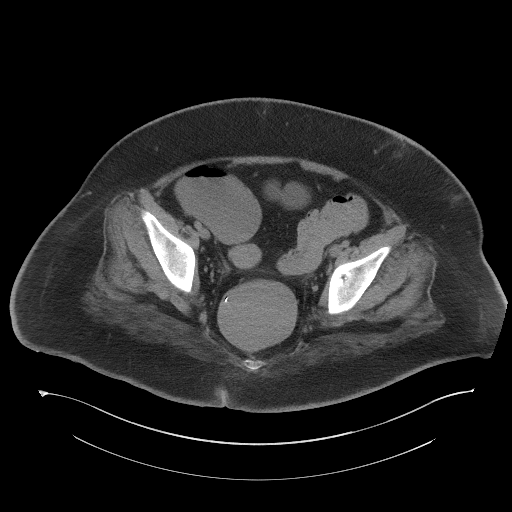
[im 36/122  soft-tissue]
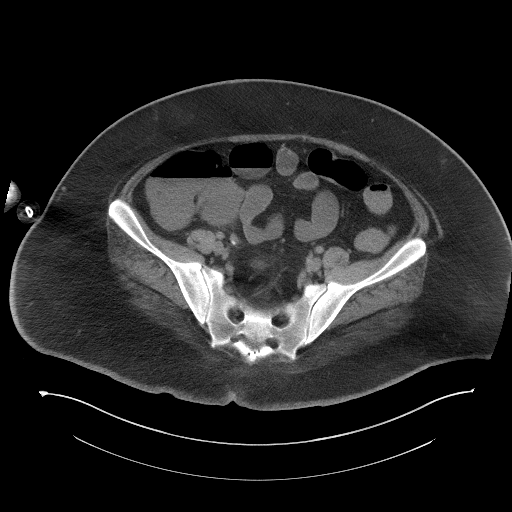
[im 41/122  soft-tissue]
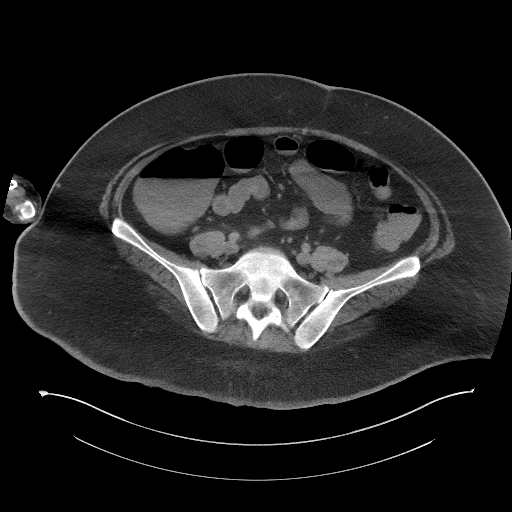
[im 51/122  soft-tissue]
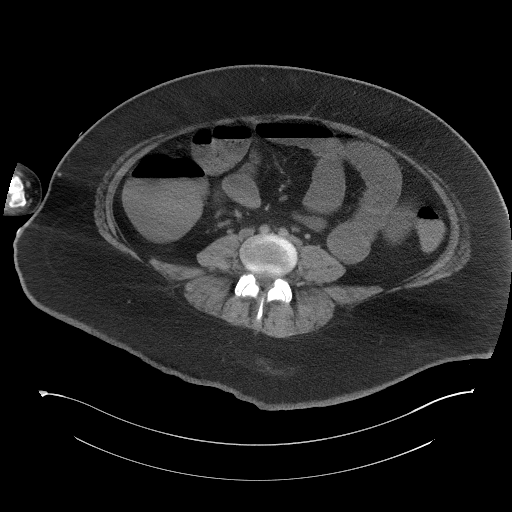
[im 61/122  soft-tissue]
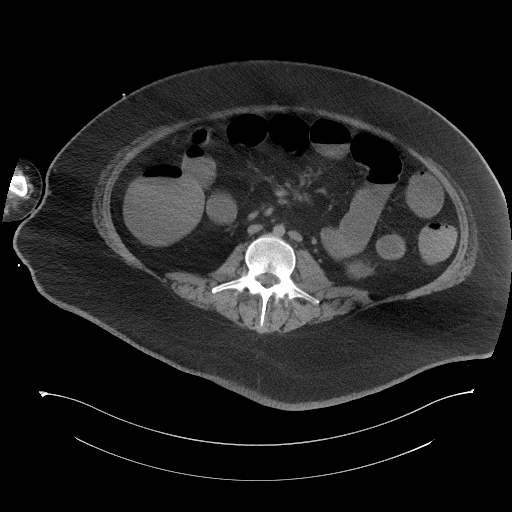
[im 71/122  soft-tissue]
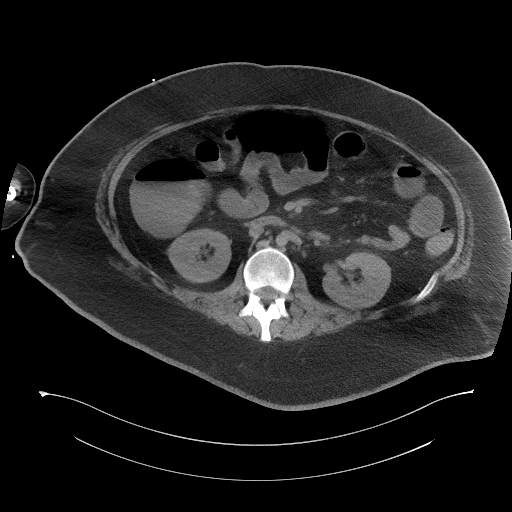
[im 81/122  soft-tissue]
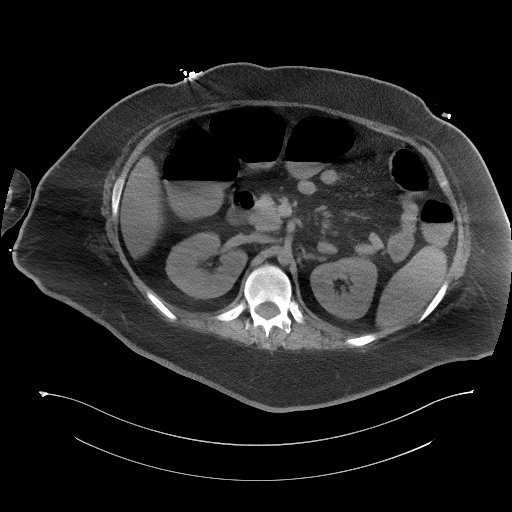
[im 81/122  bone]
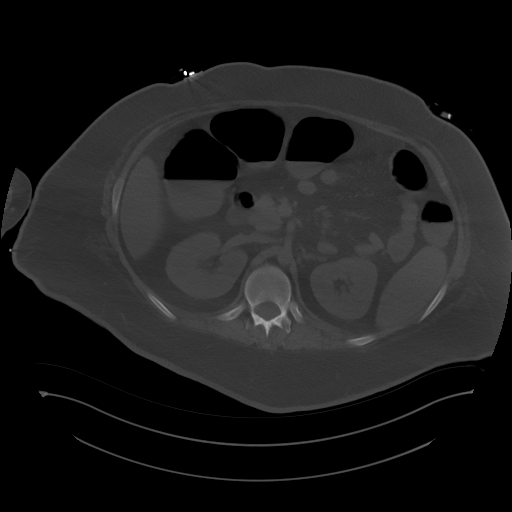
[im 86/122  soft-tissue]
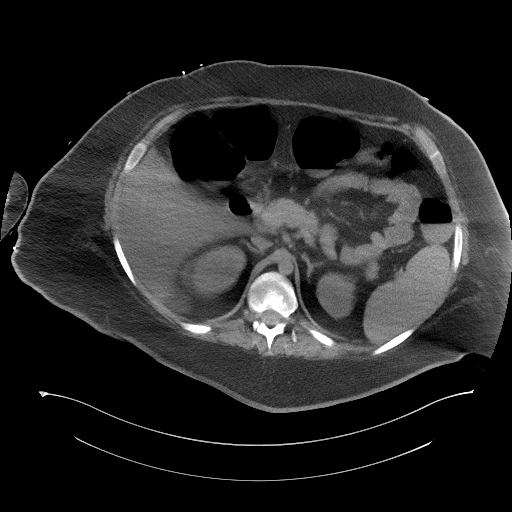
[im 96/122  soft-tissue]
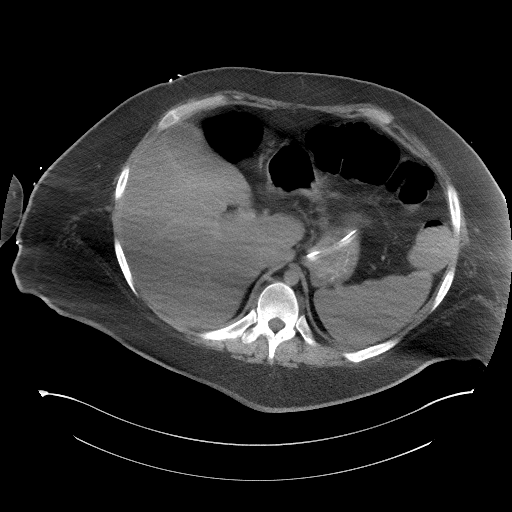
[im 106/122  soft-tissue]
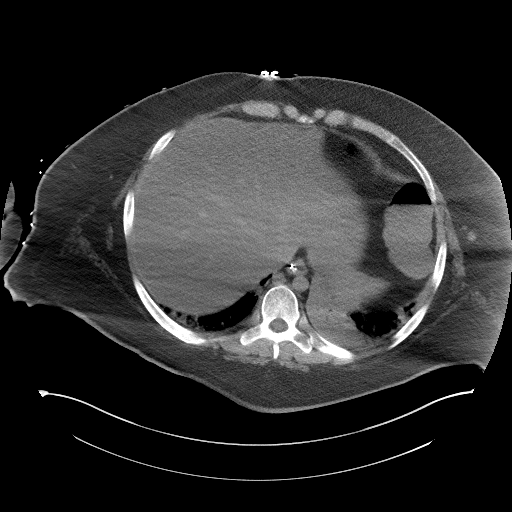
[im 116/122  soft-tissue]
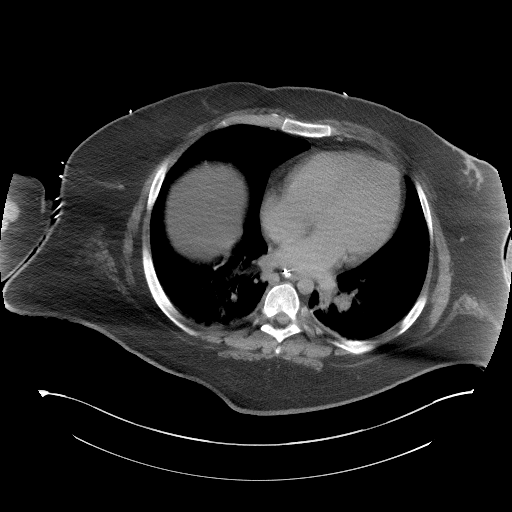

[Series 6: a/p w/ cor · coronal · 1.03mm/px · 3 of 136 slices shown]
[im 46/136  soft-tissue]
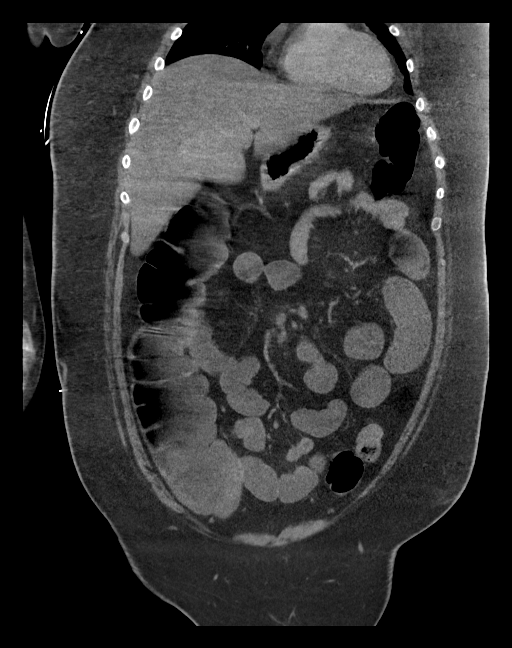
[im 61/136  soft-tissue]
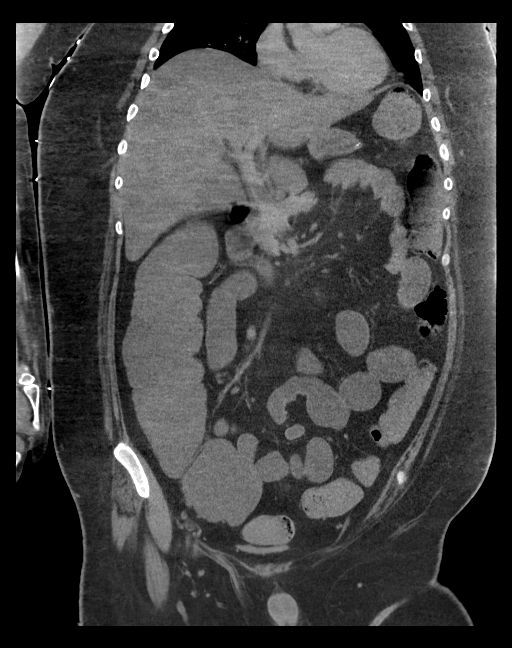
[im 76/136  soft-tissue]
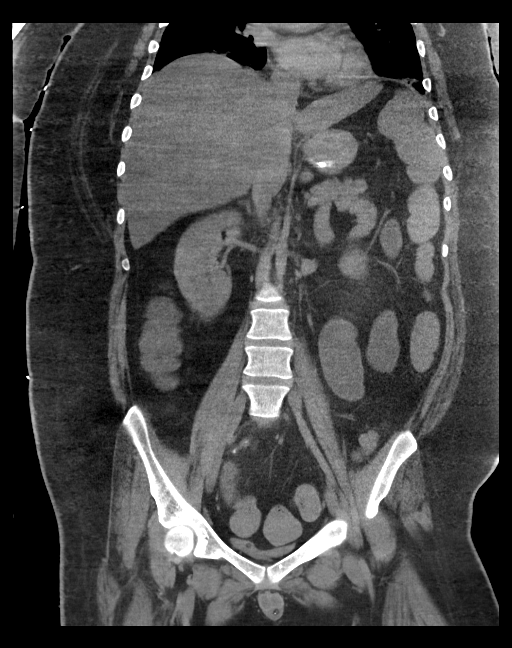

[16 of 46 positions shown; findings below may reference images not displayed]

FINDINGS: Lower chest: Normal heart size. Airspace disease in both lower
lungs, most confluent in the medial left lower lobe.

Hepatobiliary: Hepatic steatosis which is diffuse and prominent.No
evidence of biliary obstruction or stone.

Pancreas: Unremarkable.

Spleen: Unremarkable.

Adrenals/Urinary Tract: Negative adrenals. No hydronephrosis or
stone. Bladder is decompressed by Foley catheter.

Stomach/Bowel: Fluid levels seen throughout small and large bowel
with moderate distention of bowel loops. A rectal tube is in place.
Maximal colonic thickening is at the ascending [4P] cm. No
bowel wall thickening. New high-density within the appendix
attributed to barium retention. There is mild widening of the
midportion appendix that is stable, no periappendiceal inflammation.
Enteric tube tip in good position at the proximal stomach.

Vascular/Lymphatic: No acute vascular abnormality. No mass or
adenopathy.

Reproductive:Negative

Other: No ascites or pneumoperitoneum.

Musculoskeletal: No acute abnormalities.
IMPRESSION: 1. Adynamic ileus pattern that has progressed from [DATE].
2. Hepatic steatosis.
3. Pneumonia both lung bases, progressed from comparison.

## 2018-12-22 MED ORDER — SODIUM CHLORIDE 0.9 % IV SOLN
3.0000 g | Freq: Four times a day (QID) | INTRAVENOUS | Status: AC
  Administered 2018-12-22 – 2018-12-26 (×19): 3 g via INTRAVENOUS
  Filled 2018-12-22 (×5): qty 3
  Filled 2018-12-22: qty 8
  Filled 2018-12-22 (×13): qty 3
  Filled 2018-12-22: qty 8

## 2018-12-22 MED ORDER — SODIUM CHLORIDE 0.9 % IV SOLN
1.5000 g | Freq: Four times a day (QID) | INTRAVENOUS | Status: DC
  Filled 2018-12-22 (×3): qty 4

## 2018-12-22 MED ORDER — SIMETHICONE 40 MG/0.6ML PO SUSP
80.0000 mg | Freq: Four times a day (QID) | ORAL | Status: DC
  Administered 2018-12-22 – 2018-12-23 (×5): 80 mg
  Filled 2018-12-22 (×6): qty 1.2

## 2018-12-22 MED ORDER — LEVETIRACETAM IN NACL 1000 MG/100ML IV SOLN
1000.0000 mg | Freq: Two times a day (BID) | INTRAVENOUS | Status: DC
  Administered 2018-12-22 – 2019-01-26 (×71): 1000 mg via INTRAVENOUS
  Filled 2018-12-22 (×74): qty 100

## 2018-12-22 MED ORDER — CHLORHEXIDINE GLUCONATE CLOTH 2 % EX PADS
6.0000 | MEDICATED_PAD | Freq: Every day | CUTANEOUS | Status: DC
  Administered 2018-12-22 – 2019-01-24 (×31): 6 via TOPICAL

## 2018-12-22 MED ORDER — CYANOCOBALAMIN 500 MCG PO TABS
250.0000 ug | ORAL_TABLET | Freq: Every day | ORAL | Status: DC
  Administered 2018-12-22 – 2018-12-27 (×6): 250 ug
  Filled 2018-12-22 (×7): qty 1

## 2018-12-22 MED ORDER — IOHEXOL 300 MG/ML  SOLN
100.0000 mL | Freq: Once | INTRAMUSCULAR | Status: AC | PRN
  Administered 2018-12-22: 100 mL via INTRAVENOUS

## 2018-12-22 NOTE — Progress Notes (Signed)
vLTM maintenance  No skin breakdown at sites  FP1, FP2, F7, F8

## 2018-12-22 NOTE — Procedures (Addendum)
Patient Name:Aaron Vega XBJ:[id number] Epilepsy Attending:Shawndale Kilpatrick Barbra Sarks Referring Physician/Provider:Dr Kathrynn Speed Duration: 12/21/2018 1231 to 12/22/2018 1231  Patient history:23 year old male with known epilepsy who was noted to have episode of extension of arm with eyes rolling upwards concerning for seizure. EEG to evaluate for seizures.   Level of alertness:Awake, asleep  AEDs during EEG study:Keppra, Onfi  Technical aspects: This EEG study was done with scalp electrodes positioned according to the 10-20 International system of electrode placement. Electrical activity was acquired at a sampling rate of 500Hz  and reviewed with a high frequency filter of 70Hz  and a low frequency filter of 1Hz . EEG data were recorded continuously and digitally stored.   Description:During awake state, no clear posterior dominant rhythm was seen. Sleep was characterized by vertex waves, sleep spindles (12-14Hz ), maximal frontocentral.EEG showed continuous generalized 5 to 6 Hz theta slowing admixed withan excessive amount of 15 to 18 Hz, 2-3 uV beta activity with irregular morphology distributed symmetrically and diffusely. Hyperventilation and photic stimulation were not performed.  Abnormality -Continuous slow, generalized -Excessive beta, generalized   IMPRESSION: This study is suggestive of mild to moderate diffuse encephalopathy, nonspecific to etiology. No seizures or epileptiform discharges were seen throughout the recording.  The excessive beta activity seen in the background is most likely due to the effect of benzodiazepine and is a benign EEG pattern.  Alec Jaros Barbra Sarks

## 2018-12-22 NOTE — Progress Notes (Signed)
NAME:  Aaron Vega, MRN:  [id number], DOB:  1995/04/16, LOS: 14 ADMISSION DATE:  12/14/2018, CONSULTATION DATE:  12/10/2018 REFERRING MD:  Earnest Conroy - TR[facility], CHIEF COMPLAINT:  Rising lactate level.  Brief History   23 year old autistic man with abdominal distension and vomiting.  History of present illness   Known for poor and declining health status from severe autism with seizures, severe constipation recently.  His QoL was significantly better prior to 08/2018 when he suffered a severe seizure at home and struck his head. Seen in ED only but has has worsening mental status since. Per chart, this was ascribed to  hypoxic ischemic encephalopathy following prolonged seizure.   Presented via ED for increasing lethargy and vomiting and decreased urine output.   Significant disconnect regarding goals of care as currently receiving hospice care at home but still full code and mother not yet willing to place limits on care.  Was unable to have MRI performed 12/12/2018 secondary to agitation and risk of respiratory depression with sedation  Post lumbar puncture 12/14/2018  Seizure, aspirated stomach contents, intubated 11/27  Past Medical History   Past Medical History:  Diagnosis Date  . Autistic disorder, current or active state   . Fatty liver   . Hypertension   . Obesity   . Pancreatitis    at age 20 years  . Pneumonia   . Pre-diabetes   . Seizures ([place])    as of 11/23/15 - no seizures for 3 month  . Sleep apnea    not on cpap (can't tolerate)  . Tics of organic origin   . Tourette syndrome    Past Surgical History:  Procedure Laterality Date  . BIOPSY  11/08/2018   Procedure: BIOPSY;  Surgeon: Jerene Bears, MD;  Location: [facility];  Service: Gastroenterology;;  . ESOPHAGOGASTRODUODENOSCOPY (EGD) WITH PROPOFOL N/A 11/08/2018   Procedure: ESOPHAGOGASTRODUODENOSCOPY (EGD) WITH PROPOFOL;  Surgeon: Jerene Bears, MD;  Location: [facility];  Service: Gastroenterology;   Laterality: N/A;  . FLEXIBLE SIGMOIDOSCOPY N/A 11/21/2018   Procedure: FLEXIBLE SIGMOIDOSCOPY;  Surgeon: Irving Copas., MD;  Location: [facility];  Service: Gastroenterology;  Laterality: N/A;  . IMPACTION REMOVAL  11/21/2018   Procedure: IMPACTION REMOVAL;  Surgeon: Irving Copas., MD;  Location: [facility];  Service: Gastroenterology;;  . RADIOLOGY WITH ANESTHESIA N/A 11/24/2015   Procedure: CT SCAN ABDOMEN AND PELVIS WITH CONTRAST;  Surgeon: Medication Radiologist, MD;  Location: [facility];  Service: Radiology;  Laterality: N/A;  . TONSILLECTOMY     and adenoidectomy    Significant Hospital Events   11/14 admission to [facility]. 11/15 increasing lactate despite fluids. 12/10/2018 transfer to intensive care unit 12/15/2018 transferred to medical floor 12/21/2018 transferred back to ICU 11/27 intubated for sepsis, respiratory failure  Consults:  PCCM 11/16. Urology 12/10/2018 Neurology  Procedures:  11/09/2018 NG tube placed per interventional radiology 12/14/2018 lumbar puncture 11/27 endotracheal intubation 11/27 central line placement, right IJ  Significant Diagnostic Tests:  11/14 - CT abdomen/pelvis: obese, small LLL infiltrate, dilated gas filled loops of small and large bowel to rectum, no transition. No gastric distension, moderate stool burden in ascending colon. 12/10/2018 MRI brain cervical vertebrae with abnormal T2 signal restricted diffusion both medial thalami most likely diagnosis of Wernicke's encephalopathy  Micro Data:  SARS-Cov2 -negative Blood cultures -no growth to date Urine culture - negative  Antimicrobials:  Cefepime 11/14 - 11/18 Flagyl 11/14-11/18 Unasyn 11/28  Interim history/subjective:  No overnight events Fevers coming down Tachycardia improved No witnessed  seizures  Objective   Blood pressure 127/64, pulse (!) 109, temperature (!) 101.5 F (38.6 C), resp. rate 14, height 5\' 11"  (1.803 m), weight (!) 140.8 kg, SpO2 100  %.    Vent Mode: CPAP;PSV FiO2 (%):  [40 %-100 %] 40 % Set Rate:  [15 bmp-18 bmp] 15 bmp Vt Set:  [600 mL] 600 mL PEEP:  [5 cmH20] 5 cmH20 Pressure Support:  [10 cmH20] 10 cmH20 Plateau Pressure:  [18 cmH20-21 cmH20] 19 cmH20   Intake/Output Summary (Last 24 hours) at 12/22/2018 0836 Last data filed at 12/22/2018 0756 Gross per 24 hour  Intake 2880.77 ml  Output 950 ml  Net 1930.77 ml   Filed Weights   12/18/18 0500 12/21/18 0540 12/22/18 0500  Weight: (!) 136.9 kg (!) 140.8 kg (!) 140.8 kg    Examination: GEN: Obese, not in distress HEENT: Moist oral mucosa, endotracheal tube in place CV: S1-S2 appreciated PULM: Clear breath sounds anteriorly GI: Protuberant, hypoactive bowel sounds EXT: No edema NEURO: Moves all extremities PSYCH: Appears anxious SKIN: Warm and dry  CXR reviewed, hypoventilated lung fields, endotracheal tube appropriately placed, line appropriately placed  Resolved Hospital Problem list   Hypernatremia  Assessment & Plan:  Acute on chronic hypoxemic respiratory failure History of obstructive sleep apnea intolerant of CPAP -Difficulty with clearing secretions. -Continue ventilator support -VAP precautions in place  Septic -May be related to aspiration event -Requiring pressors -Cultures negative to date -Empirically start on Unasyn for aspiration-can be deescalated in 48 to 72 hours if no ongoing evidence of infection  Ascending weakness and encephalopathy -Wernicke's encephalopathy -Possibility of guillian Barr, s/p 5 days of IVIG -Continue thiamine replacement  Recurrent fevers -Cultures negative to date -Possible aspiration -Start on Unasyn  Possible seizure activity -EEG ongoing -No witnessed seizures so far  ileus -Persistent -On erythromycin -Started on neostigmine -For CT abdomen/pelvis today -Add simethicone  Transaminitis -Continue to monitor closely  Morbid obesity  Autism  Updated mother at bedside today   Labs   CBC: Recent Labs  Lab 12/18/18 0159 12/20/18 0827 12/21/18 0752 12/21/18 1154 12/22/18 0013 12/22/18 0500  WBC 7.0 6.0 10.2  --   --  11.8*  NEUTROABS 3.8  --   --   --   --   --   HGB 9.9* 9.8* 10.4* 11.2* 9.9* 9.1*  HCT 30.1* 31.3* 32.9* 33.0* 29.0* 29.6*  MCV 87.0 90.7 89.4  --   --  90.5  PLT 383 440* 669*  --   --  607*    Basic Metabolic Panel: Recent Labs  Lab 12/18/18 0159 12/19/18 0857 12/20/18 0223 12/21/18 0752 12/21/18 1154 12/22/18 0013 12/22/18 0500  NA 137 137 138 143 145 146* 144  K 3.4* 3.6 3.9 4.1 4.5 3.9 3.9  CL 100 101 103 105  --   --  107  CO2 26 26 26 29   --   --  30  GLUCOSE 99 99 100* 105*  --   --  110*  BUN <5* 6 6 10   --   --  20  CREATININE 0.42* 0.39* 0.34* 0.54*  --   --  0.54*  CALCIUM 9.0 9.4 9.4 9.6  --   --  9.1  MG 1.6* 1.7 1.8  --   --   --   --    The patient is critically ill with multiple organ systems failure and requires high complexity decision making for assessment and support, frequent evaluation and titration of therapies, application of advanced monitoring  technologies and extensive interpretation of multiple databases. Critical Care Time devoted to patient care services described in this note independent of APP/resident time (if applicable)  is 40 minutes.   Virl DiamondAdewale Yerik Zeringue MD [place] [facility] Personal pager: [phone] If unanswered, please page CCM On-call: #[phone]

## 2018-12-22 NOTE — Progress Notes (Signed)
Subjective: No seizures by EEG  Exam: Vitals:   12/22/18 1900 12/22/18 1947  BP: (!) 119/43   Pulse: 99   Resp: 18   Temp: (!) 104.2 F (40.1 C)   SpO2: 100% 100%   Gen: In bed, intubated Resp: Ventilated  Neuro: MS: Eyes open, nonverbal at baseline, does not follow commands CN: Pupils equal round reactive, eyes cross midline in both directions, blinks to threat Motor: He moves his bilateral arms against gravity, but with distal weakness in the hands, in the legs, he has minimal proximal flexion to noxious stimulation bilaterally.  1/5 abduction and adduction  Sensory: He grimaces to noxious stimulation of the lower extremities DTR: Areflexic throughout  Pertinent Labs: CSF -0 WBC -550 RBC -Protein 172 -Glucose 60  Zinc- normal at 120 Serum copper- normal at 122 B12 low at 159 B1-low at 22  Impression: 23 year old male with autism and seizure disorder who presents with an acute to subacute neuropathy over the past 6 weeks. On further discussion with his mother, the distal weakness began FIRST, follwoed by the nausea and vomiting. This, coupled with the elevated CSF protein, makes GBS the more likely cuprit, but his B1 was low at 28 on admission as well, keeping this in the differential. I suspect that the n/v could be due to adynamic ileus from GBS.   He has finished 3 days of high-dose IV thiamine and today is day 5 of IVIG. I do not think, especially this far into symptoms, that further immune treatment beyond IVIG would be indicated. Treatment moving forward will be primarily be physical therapy.   I actually wonder if there may be a multifactorial process including Guillian-Barr as initial agent with B1 deficiency playing a role later in the course.  I do not think that the episodes concerning for seizure yesterday were actual seizure, but rather represented episodes of distress.   Recommendations: 1) continue thiamine supplementation 100 mg daily 2) multivitamin 3)  at this point supportive care is really the mainstay. 4) given concern for poor GI absorption, I will change his Keppra to IV for the time being. 5) continue Onfi p.o. 6) continue EEG monitoring.  Roland Rack, MD [facility] [phone]  If 7pm- 7am, please page neurology on call as listed in [place].

## 2018-12-22 NOTE — Progress Notes (Signed)
Palliative: Chart reviewed.  Light touch and support to mother.  We talk about the treatment plan.  Support to nursing staff.   Plan:  Continue FULL scope/FULL code. PMT to follow.   93 minutes Quinn Axe, NP Palliative Medicine Team Team Phone # [phone] Greater than 50% of this time was spent counseling and coordinating care related to the above assessment and plan

## 2018-12-22 NOTE — Progress Notes (Signed)
Met Aaron Vega's mother in the 27M hallway searching for [employer].  After leading her to the stairway down to [place], as we were parting, she asked me to pray for her son.  We stopped on the stairs and I offered prayers for Aaron Vega and for her mother heart.  De Burrs Chaplain Resident

## 2018-12-22 NOTE — Progress Notes (Signed)
Pt transported from 4N25 to CT and back without incident. 

## 2018-12-23 DIAGNOSIS — A419 Sepsis, unspecified organism: Secondary | ICD-10-CM | POA: Diagnosis not present

## 2018-12-23 DIAGNOSIS — R6520 Severe sepsis without septic shock: Secondary | ICD-10-CM | POA: Diagnosis not present

## 2018-12-23 DIAGNOSIS — L8990 Pressure ulcer of unspecified site, unspecified stage: Secondary | ICD-10-CM | POA: Insufficient documentation

## 2018-12-23 LAB — CBC
HCT: 26 % — ABNORMAL LOW (ref 39.0–52.0)
Hemoglobin: 8.1 g/dL — ABNORMAL LOW (ref 13.0–17.0)
MCH: 28 pg (ref 26.0–34.0)
MCHC: 31.2 g/dL (ref 30.0–36.0)
MCV: 90 fL (ref 80.0–100.0)
Platelets: 475 10*3/uL — ABNORMAL HIGH (ref 150–400)
RBC: 2.89 MIL/uL — ABNORMAL LOW (ref 4.22–5.81)
RDW: 14.8 % (ref 11.5–15.5)
WBC: 6.5 10*3/uL (ref 4.0–10.5)
nRBC: 0 % (ref 0.0–0.2)

## 2018-12-23 LAB — GLUCOSE, CAPILLARY
Glucose-Capillary: 105 mg/dL — ABNORMAL HIGH (ref 70–99)
Glucose-Capillary: 86 mg/dL (ref 70–99)
Glucose-Capillary: 91 mg/dL (ref 70–99)
Glucose-Capillary: 93 mg/dL (ref 70–99)
Glucose-Capillary: 93 mg/dL (ref 70–99)
Glucose-Capillary: 98 mg/dL (ref 70–99)

## 2018-12-23 LAB — URINE CULTURE: Culture: NO GROWTH

## 2018-12-23 LAB — PROCALCITONIN: Procalcitonin: 0.76 ng/mL

## 2018-12-23 MED ORDER — PSYLLIUM 95 % PO PACK
1.0000 | PACK | Freq: Two times a day (BID) | ORAL | Status: DC
  Administered 2018-12-23 – 2018-12-30 (×15): 1
  Filled 2018-12-23 (×15): qty 1

## 2018-12-23 MED ORDER — VITAL HIGH PROTEIN PO LIQD
1000.0000 mL | ORAL | Status: DC
  Administered 2018-12-25: 1000 mL

## 2018-12-23 MED ORDER — SODIUM CHLORIDE 0.9% FLUSH
10.0000 mL | INTRAVENOUS | Status: DC | PRN
  Administered 2018-12-26: 10 mL
  Filled 2018-12-23: qty 40

## 2018-12-23 MED ORDER — PSYLLIUM 95 % PO PACK
1.0000 | PACK | Freq: Two times a day (BID) | ORAL | Status: DC
  Filled 2018-12-23 (×2): qty 1

## 2018-12-23 MED ORDER — SODIUM CHLORIDE 0.9% FLUSH
10.0000 mL | Freq: Two times a day (BID) | INTRAVENOUS | Status: DC
  Administered 2018-12-23 – 2018-12-29 (×9): 10 mL

## 2018-12-23 MED ORDER — SIMETHICONE 40 MG/0.6ML PO SUSP
40.0000 mg | Freq: Four times a day (QID) | ORAL | Status: AC
  Administered 2018-12-23 (×3): 40 mg
  Filled 2018-12-23 (×3): qty 0.6

## 2018-12-23 NOTE — Progress Notes (Signed)
[facility] Progress Note Patient Name: Eshaan Titzer DOB: 28-Oct-1995 MRN: [id number]   Date of Service  12/23/2018  HPI/Events of Note  Ileus. Asking for flexi seal. Autism. Severe sepsis.   eICU Interventions  Flexi seal ordered to prevent decubital ulcer/skin excoriation infection.      Intervention Category Minor Interventions: Other:  Elmer Sow 12/23/2018, 2:50 AM

## 2018-12-23 NOTE — Progress Notes (Signed)
NAME:  Aaron Vega, MRN:  [id number], DOB:  12/02/1995, LOS: 15 ADMISSION DATE:  25-Apr-2018, CONSULTATION DATE:  12/10/2018 REFERRING MD:  Lajuana RippleKamineni - TR[facility], CHIEF COMPLAINT:  Rising lactate level.  Brief History   23 year old autistic man with abdominal distension and vomiting.  History of present illness   Known for poor and declining health status from severe autism with seizures, severe constipation recently.  His QoL was significantly better prior to 08/2018 when he suffered a severe seizure at home and struck his head. Seen in ED only but has has worsening mental status since. Per chart, this was ascribed to  hypoxic ischemic encephalopathy following prolonged seizure.   Presented via ED for increasing lethargy and vomiting and decreased urine output.   Significant disconnect regarding goals of care as currently receiving hospice care at home but still full code and mother not yet willing to place limits on care.  Was unable to have MRI performed 12/12/2018 secondary to agitation and risk of respiratory depression with sedation  Post lumbar puncture 12/14/2018  Seizure, aspirated stomach contents, intubated 11/27  Past Medical History   Past Medical History:  Diagnosis Date  . Autistic disorder, current or active state   . Fatty liver   . Hypertension   . Obesity   . Pancreatitis    at age 61 years  . Pneumonia   . Pre-diabetes   . Seizures ([facility])    as of 11/23/15 - no seizures for 3 month  . Sleep apnea    not on cpap (can't tolerate)  . Tics of organic origin   . Tourette syndrome    Past Surgical History:  Procedure Laterality Date  . BIOPSY  11/08/2018   Procedure: BIOPSY;  Surgeon: Beverley FiedlerPyrtle, Jay M, MD;  Location: [facility];  Service: Gastroenterology;;  . ESOPHAGOGASTRODUODENOSCOPY (EGD) WITH PROPOFOL N/A 11/08/2018   Procedure: ESOPHAGOGASTRODUODENOSCOPY (EGD) WITH PROPOFOL;  Surgeon: Beverley FiedlerPyrtle, Jay M, MD;  Location: [facility];  Service: Gastroenterology;   Laterality: N/A;  . FLEXIBLE SIGMOIDOSCOPY N/A 11/21/2018   Procedure: FLEXIBLE SIGMOIDOSCOPY;  Surgeon: Lemar LoftyMansouraty, Gabriel Jr., MD;  [facility];  Service: Gastroenterology;  Laterality: N/A;  . IMPACTION REMOVAL  11/21/2018   Procedure: IMPACTION REMOVAL;  Surgeon: Lemar LoftyMansouraty, Gabriel Jr., MD;  [facility];  Service: Gastroenterology;;  . RADIOLOGY WITH ANESTHESIA N/A 11/24/2015   Procedure: CT SCAN ABDOMEN AND PELVIS WITH CONTRAST;  Surgeon: Medication Radiologist, MD;  Location: [facility];  Service: Radiology;  Laterality: N/A;  . TONSILLECTOMY     and adenoidectomy    Significant Hospital Events   [facility] 11/15 increasing lactate despite fluids. 12/10/2018 transfer to intensive care unit 12/15/2018 transferred to medical floor 12/21/2018 transferred back to ICU 11/27 intubated for sepsis, respiratory failure  Consults:  PCCM 11/16. Urology 12/10/2018 Neurology  Procedures:  11/09/2018 NG tube placed per interventional radiology 12/14/2018 lumbar puncture 11/27 endotracheal intubation 11/27 central line placement, right IJ  Significant Diagnostic Tests:  11/14 - CT abdomen/pelvis: obese, small LLL infiltrate, dilated gas filled loops of small and large bowel to rectum, no transition. No gastric distension, moderate stool burden in ascending colon. 12/10/2018 MRI brain cervical vertebrae with abnormal T2 signal restricted diffusion both medial thalami most likely diagnosis of Wernicke's encephalopathy CT abdomen and pelvis 11/28 IMPRESSION: 1. Adynamic ileus pattern that has progressed from 25-Apr-2018. 2. Hepatic steatosis. 3. Pneumonia both lung bases, progressed from comparison.  Micro Data:  SARS-Cov2 -negative Blood cultures -no growth to date Urine culture -  negative  Antimicrobials:  Cefepime 11/14 - 11/18 Flagyl 11/14-11/18 Unasyn 11/28  Interim history/subjective:   Bowel movement++ Fever better Comfortable on vent   Objective   Blood pressure 118/65, pulse 92, temperature (!) 101.1 F (38.4 C), resp. rate 16, height 5\' 11"  (1.803 m), weight (!) 140.8 kg, SpO2 100 %.    Vent Mode: CPAP;PSV FiO2 (%):  [40 %] 40 % Set Rate:  [15 bmp] 15 bmp Vt Set:  [600 mL] 600 mL PEEP:  [5 cmH20] 5 cmH20 Pressure Support:  [8 cmH20] [place] Pressure:  [18 cmH20-22 cmH20] 19 cmH20   Intake/Output Summary (Last 24 hours) at 12/23/2018 0931 Last data filed at 12/23/2018 0700 Gross per 24 hour  Intake 3632 ml  Output 1875 ml  Net 1757 ml   Filed Weights   12/18/18 0500 12/21/18 0540 12/22/18 0500  Weight: (!) 136.9 kg (!) 140.8 kg (!) 140.8 kg    Examination: GEN: Obese, not in distress HEENT: Moist oral mucosa, endotracheal tube in place CV: S1-S2 appreciated PULM: Clear breath sounds bilaterally GI: Protuberant, hypoactive bowel sounds EXT: No edema NEURO: Sedated SKIN: Warm and dry  Chest x-ray reviewed CT scan of the abdomen did suggest new infiltrate at the bases  Resolved Hospital Problem list   Hypernatremia  Assessment & Plan:  Acute on chronic hypoxemic respiratory failure History of obstructive sleep apnea, intolerant of CPAP -Difficulty with clearing secretions -Continue ventilator support -VAP precautions in place  Sepsis -Related to aspiration event -Requiring pressors -Cultures negative to date -Day 2 of antibiotics-Unasyn  Diarrhea -Patient finally emptying out -Will discontinue neostigmine -Rectal tube in place -Add Metamucil  Ascending weakness and encephalopathy -Wernicke's encephalopathy -Bayard Males, s/p 5 days of IVIG -Continue thiamine replacement  Recurrent fevers -Cultures remain negative -Possible aspiration -Continue Unasyn -Anticipate 3 to 5 days of antibiotics  Possible seizure activity -EEG -No evidence of recurrent seizures -Doubtful if initial event was a seizure episode  Ileus -On erythromycin -We will discontinue neostigmine  -Reduce dose of simethicone and discontinue today   Transaminitis -Continue to monitor closely  Nutrition -Start trickle feeding today  Morbid obesity  Autism  Updated mother at bedside  Labs   CBC: Recent Labs  Lab 12/18/18 0159 12/20/18 0827 12/21/18 0752 12/21/18 1154 12/22/18 0013 12/22/18 0500 12/23/18 0445  WBC 7.0 6.0 10.2  --   --  11.8* 6.5  NEUTROABS 3.8  --   --   --   --   --   --   HGB 9.9* 9.8* 10.4* 11.2* 9.9* 9.1* 8.1*  HCT 30.1* 31.3* 32.9* 33.0* 29.0* 29.6* 26.0*  MCV 87.0 90.7 89.4  --   --  90.5 90.0  PLT 383 440* 669*  --   --  607* 475*    Basic Metabolic Panel: Recent Labs  Lab 12/18/18 0159 12/19/18 0857 12/20/18 0223 12/21/18 0752 12/21/18 1154 12/22/18 0013 12/22/18 0500  NA 137 137 138 143 145 146* 144  K 3.4* 3.6 3.9 4.1 4.5 3.9 3.9  CL 100 101 103 105  --   --  107  CO2 26 26 26 29   --   --  30  GLUCOSE 99 99 100* 105*  --   --  110*  BUN <5* 6 6 10   --   --  20  CREATININE 0.42* 0.39* 0.34* 0.54*  --   --  0.54*  CALCIUM 9.0 9.4 9.4 9.6  --   --  9.1  MG 1.6* 1.7 1.8  --   --   --   --  The patient is critically ill with multiple organ systems failure and requires high complexity decision making for assessment and support, frequent evaluation and titration of therapies, application of advanced monitoring technologies and extensive interpretation of multiple databases. Critical Care Time devoted to patient care services described in this note independent of APP/resident time (if applicable)  is 35 minutes.   Virl Diamond MD [place] [facility] Personal pager: [phone] If unanswered, please page CCM On-call: #[phone]

## 2018-12-23 NOTE — Procedures (Addendum)
Patient Name:Aaron Vega JEH:[id number] Epilepsy Attending:Camryn Quesinberry Barbra Sarks Referring Physician/Provider:DrMcNeil Leonel Ramsay Duration:12/21/2018 1231 to 12/22/2018 1428  Patient history:23 year old male with known epilepsywho was noted to have episode of extension of arm with eyes rolling upwards concerning for seizure. EEG to evaluate for seizures.   Level of alertness:Awake, asleep  AEDs during EEG study:Keppra, Onfi  Technical aspects: This EEG study was done with scalp electrodes positioned according to the 10-20 International system of electrode placement. Electrical activity was acquired at a sampling rate of 500Hz  and reviewed with a high frequency filter of 70Hz  and a low frequency filter of 1Hz . EEG data were recorded continuously and digitally stored.   Description:During awake state, no clear posterior dominant rhythm was seen. Sleep was characterized by vertex waves, sleep spindles (12-14Hz ), maximal frontocentral.EEG showed continuous generalized 5 to 6 Hz theta slowing admixed withan excessive amount of 15 to 18 Hz, 2-3 uV beta activity with irregular morphology distributed symmetrically and diffusely. Hyperventilation and photic stimulation were not performed.  Event button was pressed on 12/23/2018 at 0807 for vertical nystagmus per RN. Event button was again pressed on 12/23/2018 at 0853 for unclear reasons. On video, patient was noted to have left arm adduction and extension. Concomitant EEG before, during and after the event did not show any EEG change to suggest seizure.  Abnormality -Continuous slow, generalized -Excessive beta, generalized   IMPRESSION: This study is suggestive of mildto moderatediffuse encephalopathy, nonspecific to etiology. No seizures or epileptiform discharges were seen throughout the recording.  The excessive beta activity seen in the background is most likely due to the effect of benzodiazepine and is a benign  EEG pattern.  Two events were recorded as described above without EEG change and were likely non epileptic.   [name] Barbra Sarks

## 2018-12-23 NOTE — Progress Notes (Signed)
vLTM discontinuied  No skin breakdown noted at d/c

## 2018-12-23 NOTE — Progress Notes (Signed)
Subjective: No seizures by EEG, an episode similar to what was seen previously(per mother) was captured on his EEG this morning without EEG change further supporting that these were not epileptic.  Due to concern for these movements, however, he was given Ativan and therefore is currently sedated.  Exam: Vitals:   12/23/18 1230 12/23/18 1245  BP: (!) 103/52 (!) 103/54  Pulse: 85 84  Resp: (!) 21 (!) 25  Temp: 99.3 F (37.4 C) 99.3 F (37.4 C)  SpO2: 99% 99%   Gen: In bed, intubated Resp: Ventilated  Neuro: MS: Eyes open to noxious stimuli minimally I have not CN: Pupils equal round reactive, eyes cross midline in both directions, blinks to threat Motor: He moves his bilateral arms against gravity, but with distal weakness in the hands, in the legs, he has minimal proximal flexion to noxious stimulation bilaterally.  1/5 abduction and adduction  Sensory: He grimaces to noxious stimulation of the lower extremities DTR: Areflexic throughout  Pertinent Labs: CSF -0 WBC -550 RBC -Protein 172 -Glucose 60  Zinc- normal at 120 Serum copper- normal at 122 B12 low at 159 B1-low at 22  Impression: 23 year old male with autism and seizure disorder who presents with an acute to subacute neuropathy over the past 6 weeks. On further discussion with his mother, the distal weakness began FIRST, follwoed by the nausea and vomiting. This, coupled with the elevated CSF protein, makes GBS the more likely cuprit, but his B1 was low at 28 on admission as well, keeping this in the differential. I suspect that the n/v could be due to adynamic ileus from GBS.   I actually wonder if there may be a multifactorial process including Guillian-Barr as initial agent with B1 deficiency playing a role later in the course.  Certainly I think his encephalopathy was likely B1 deficiency given that he also had nystagmus, and this was improving prior to his aspiration event.  He has completed a course of high-dose  thiamine.  I do not think that the episodes concerning for seizure prior to intubation were actual seizure, but rather represented episodes of distress due to his aspiration and gagging/shortness of breath.  Given that care moving forward is purely supportive, neurology will be available on an as-needed basis moving forward.  Recommendations: 1) continue thiamine supplementation 100 mg daily 2) multivitamin 3) at this point supportive care is really the mainstay. 4) once reliably taking p.o., could change Keppra back to oral administration 5) continue Onfi p.o. 6) discontinue EEG 7) neurology will be available on an as-needed basis moving forward, please call with questions or concerns.  Roland Rack, MD [facility] [phone]  If 7pm- 7am, please page neurology on call as listed in [place].

## 2018-12-24 ENCOUNTER — Inpatient Hospital Stay (HOSPITAL_COMMUNITY)

## 2018-12-24 DIAGNOSIS — R509 Fever, unspecified: Secondary | ICD-10-CM

## 2018-12-24 DIAGNOSIS — R6520 Severe sepsis without septic shock: Secondary | ICD-10-CM | POA: Diagnosis not present

## 2018-12-24 DIAGNOSIS — A419 Sepsis, unspecified organism: Secondary | ICD-10-CM | POA: Diagnosis not present

## 2018-12-24 LAB — CBC
HCT: 22.5 % — ABNORMAL LOW (ref 39.0–52.0)
Hemoglobin: 6.7 g/dL — CL (ref 13.0–17.0)
MCH: 27.2 pg (ref 26.0–34.0)
MCHC: 29.8 g/dL — ABNORMAL LOW (ref 30.0–36.0)
MCV: 91.5 fL (ref 80.0–100.0)
Platelets: 395 10*3/uL (ref 150–400)
RBC: 2.46 MIL/uL — ABNORMAL LOW (ref 4.22–5.81)
RDW: 14.6 % (ref 11.5–15.5)
WBC: 5.1 10*3/uL (ref 4.0–10.5)
nRBC: 0 % (ref 0.0–0.2)

## 2018-12-24 LAB — GLUCOSE, CAPILLARY
Glucose-Capillary: 84 mg/dL (ref 70–99)
Glucose-Capillary: 85 mg/dL (ref 70–99)
Glucose-Capillary: 85 mg/dL (ref 70–99)
Glucose-Capillary: 87 mg/dL (ref 70–99)
Glucose-Capillary: 84 mg/dL (ref 70–99)
Glucose-Capillary: 80 mg/dL (ref 70–99)

## 2018-12-24 LAB — PREPARE RBC (CROSSMATCH)

## 2018-12-24 IMAGING — DX DG ABDOMEN 1V
1 series · 1 of 1 positions shown · non-contrast
Comparison: [DATE].

CLINICAL DATA: Nasogastric tube placement.

EXAM:
ABDOMEN - 1 VIEW

[abdomen]
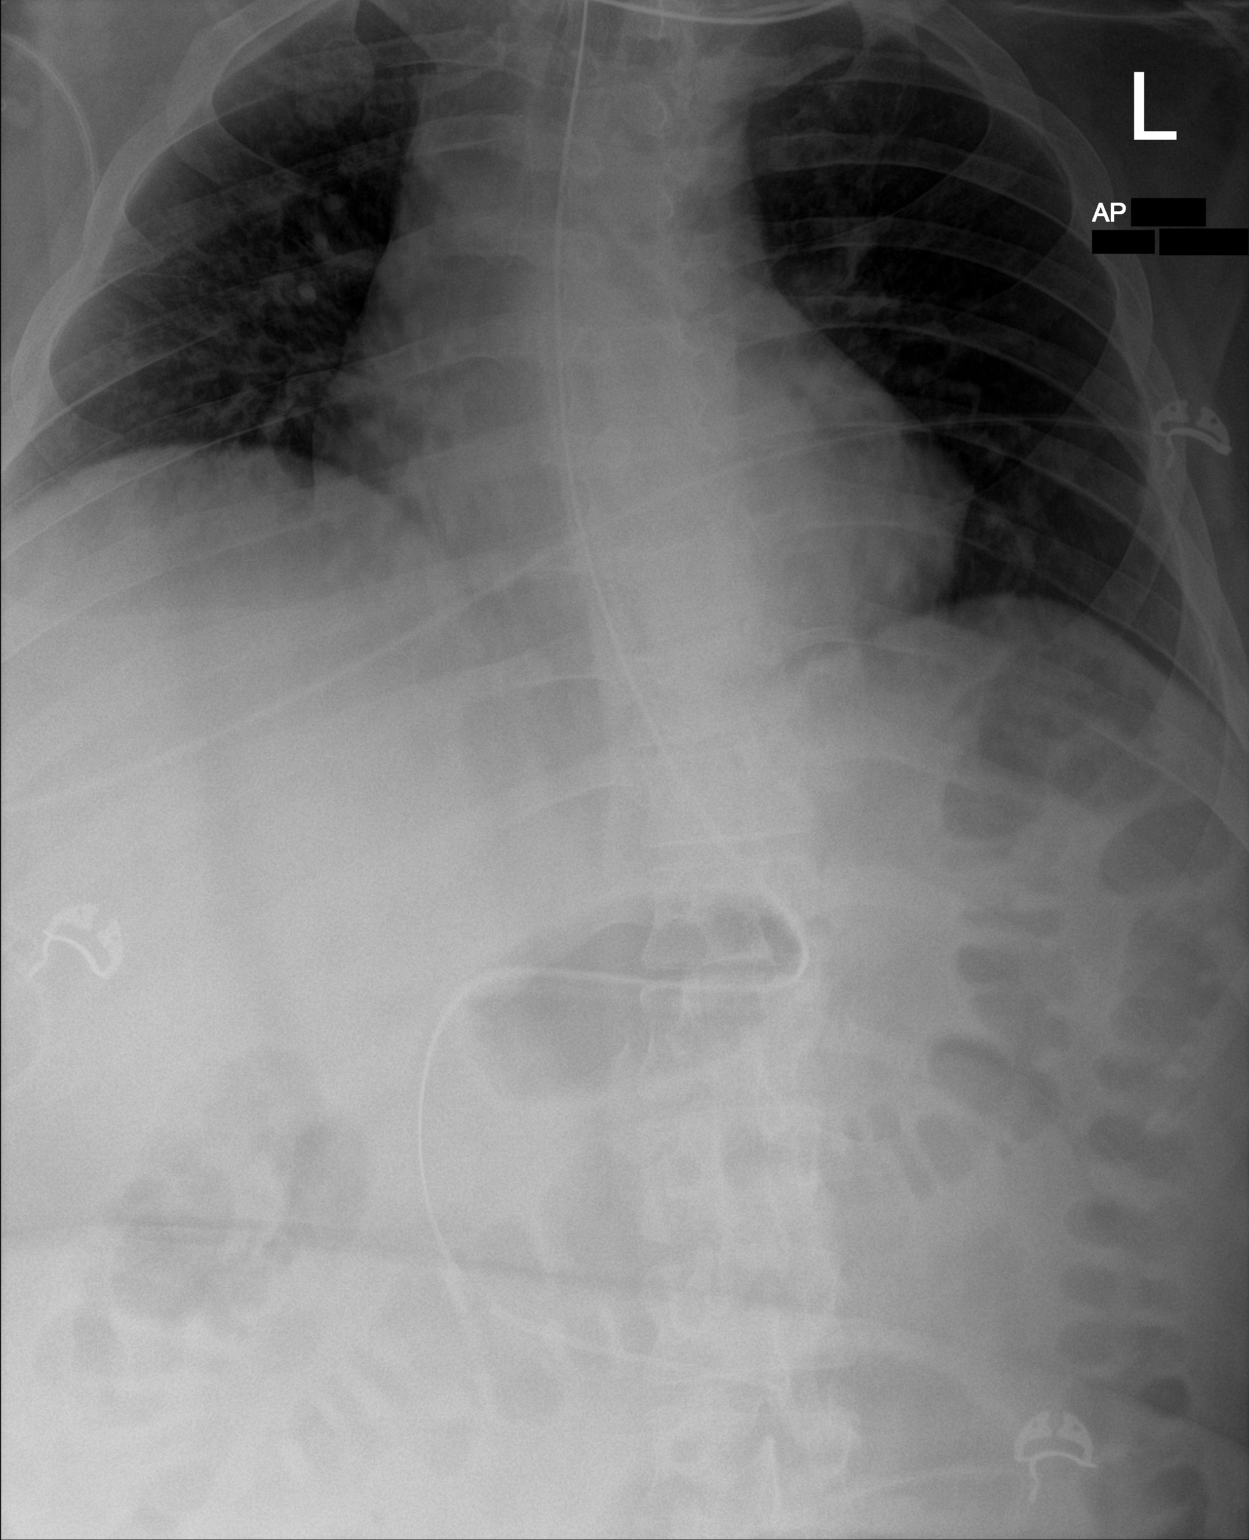

[1 of 1 positions shown; findings below may reference images not displayed]

FINDINGS: The bowel gas pattern is normal. Nasogastric tube tip is seen in
expected position of third portion of duodenum. No radio-opaque
calculi or other significant radiographic abnormality are seen.
IMPRESSION: Nasogastric tube tip seen in expected position of third portion of
duodenum. No evidence of bowel obstruction or ileus.

## 2018-12-24 MED ORDER — SODIUM CHLORIDE 0.9% IV SOLUTION: Freq: Once | INTRAVENOUS | Status: DC

## 2018-12-24 MED ORDER — ORAL CARE MOUTH RINSE
15.0000 mL | Freq: Two times a day (BID) | OROMUCOSAL | Status: DC
  Administered 2018-12-24 – 2019-01-26 (×61): 15 mL via OROMUCOSAL

## 2018-12-24 NOTE — Progress Notes (Signed)
RT NTS patient without complications. Vitals stable throughout. RT will continue to monitor.

## 2018-12-24 NOTE — Progress Notes (Signed)
[place] Progress Note Patient Name: Aaron Vega DOB: [birth year] MRN: [id number]   Date of Service  12/24/2018  HPI/Events of Note  Anemia - hgb = 6.7.  eICU Interventions  Will transfuse 1 unit PRBC.      Intervention Category Major Interventions: Other:  Chiyoko Torrico Cornelia Copa 12/24/2018, 7:01 AM

## 2018-12-24 NOTE — Progress Notes (Signed)
SLP Cancellation Note  Patient Details Name: Aaron Vega MRN: [id number] DOB: May 25, 1995   Cancelled treatment:       Reason Eval/Treat Not Completed: Medical issues which prohibited therapy; Pt recently extubated, with fluctuations in respirations and O2 and increased coughing at baseline. Will continue to monitor for PO readiness. Aspiration risk remains significantly increased recommend NPO with medicines via alternative means.   [place], CCC-SLP 12/24/2018, 1:04 PM

## 2018-12-24 NOTE — Progress Notes (Signed)
NAME:  Aaron Vega, MRN:  [id number], DOB:  12-02-1995, LOS: 16 ADMISSION DATE:  01/07/2019, CONSULTATION DATE:  12/10/2018 REFERRING MD:  Lajuana Ripple - TR[facility], CHIEF COMPLAINT:  Rising lactate level.  Brief History   23 year old autistic man with abdominal distension and vomiting.  History of present illness   Known for poor and declining health status from severe autism with seizures, severe constipation recently.  His QoL was significantly better prior to 08/2018 when he suffered a severe seizure at home and struck his head. Seen in ED only but has has worsening mental status since. Per chart, this was ascribed to  hypoxic ischemic encephalopathy following prolonged seizure.   Presented via ED for increasing lethargy and vomiting and decreased urine output.   Significant disconnect regarding goals of care as currently receiving hospice care at home but still full code and mother not yet willing to place limits on care.  Was unable to have MRI performed 12/12/2018 secondary to agitation and risk of respiratory depression with sedation  Post lumbar puncture 12/14/2018  Seizure, aspirated stomach contents, intubated 11/27  Past Medical History  has Tics of organic origin; Autistic disorder; Intractable nausea and vomiting; Obesity (BMI 30-39.9); Seizure disorder (HCC); Dehydration; Vomiting in adult; Reflux esophagitis; Constipation in male; Constipation; Abnormal LFTs; Hyperglycemia; Obesity, Class III, BMI 40-49.9 (morbid obesity) (HCC); Hematemesis; Severe sepsis (HCC); Ileus (HCC); Aspiration pneumonia (HCC); Community acquired pneumonia of left lower lobe of lung; Goals of care, counseling/discussion; Palliative care by specialist; DNR (do not resuscitate) discussion; Protein-calorie malnutrition, severe; Weakness of lower extremity; Aspiration into airway; and Pressure injury of skin on their problem list.   Significant Hospital Events   11/14 admission to [facility]. 11/15 increasing lactate  despite fluids. 12/10/2018 transfer to intensive care unit 12/15/2018 transferred to medical floor 12/21/2018 transferred back to ICU 11/27 intubated for sepsis, respiratory failure  Consults:  PCCM 11/16. Urology 12/10/2018 Neurology  Procedures:  11/09/2018 NG tube placed per interventional radiology 12/14/2018 lumbar puncture 11/27 endotracheal intubation 11/27 central line placement, right IJ  Significant Diagnostic Tests:  11/14 - CT abdomen/pelvis: obese, small LLL infiltrate, dilated gas filled loops of small and large bowel to rectum, no transition. No gastric distension, moderate stool burden in ascending colon. 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  No seizures.  Micro Data:  SARS-Cov2 -negative Blood cultures -no growth to date Urine culture - negative  Antimicrobials:  Cefepime 11/14 - 11/18 Flagyl 11/14-11/18 Unasyn 11/28 >   Interim history/subjective:  No acute events.  Objective   Blood pressure (!) 129/58, pulse 95, temperature 99.7 F (37.6 C), resp. rate 16, height 5\' 11"  (1.803 m), weight (!) 140.8 kg, SpO2 99 %.    Vent Mode: PRVC FiO2 (%):  [30 %-40 %] 30 % Set Rate:  [15 bmp] 15 bmp Vt Set:  [600 mL] 600 mL PEEP:  [5 cmH20] 5 cmH20 Pressure Support:  [8 cmH20] 8 cmH20 Plateau Pressure:  [14 cmH20-19 cmH20] 14 cmH20   Intake/Output Summary (Last 24 hours) at 12/24/2018 0829 Last data filed at 12/24/2018 0530 Gross per 24 hour  Intake 1992.43 ml  Output 2450 ml  Net -457.57 ml   Filed Weights   12/18/18 0500 12/21/18 0540 12/22/18 0500  Weight: (!) 136.9 kg (!) 140.8 kg (!) 140.8 kg  Examination:  GEN: Obese young male, not in distress HEENT: Moist oral mucosa, endotracheal tube in place CV: RRR, no  M/R/G PULM: Clear breath sounds bilaterally GI: Protuberant, hypoactive bowel sounds EXT: No edema NEURO: Arousable.  Follows basic commands. 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  S/p 5 days IVIG. -Continue thiamine replacement  Possible seizure activity - EEG's neg for epileptiform activity.  Does have hx of underlying seizures however. - Continue onfi, keppra - Neuro following from a distance  Ileus - Continue erythromycin  Nutrition -Continue trickle feeding today   Updated mother at bedside   CC time: 30 min.   Montey Hora, [facility] 12/24/2018, 8:46 AM

## 2018-12-24 NOTE — Progress Notes (Signed)
Nutrition Follow-up  DOCUMENTATION CODES:   Severe malnutrition in context of acute illness/injury  INTERVENTION:   Recommend increase Vital 1.5 to 55 ml/hr 60 ml Prostat BID  Provides: 2380 kcal, 149 grams protein, and 1008 ml free water.   Pt has been without adequate nutrition x 16 days. If unable to advance to goal rate recommend TPN to meet nutrition needs.   NUTRITION DIAGNOSIS:   Severe Malnutrition related to acute illness as evidenced by energy intake < or equal to 50% for > or equal to 5 days, percent weight loss.  Ongoing.   GOAL:   Patient will meet greater than or equal to 90% of their needs  Progressing.    MONITOR:   TF tolerance, Diet advancement, Labs, Weight trends  REASON FOR ASSESSMENT:   Consult Enteral/tube feeding initiation and management, Assessment of nutrition requirement/status  ASSESSMENT:   23 yo male with autism and epilepsy with 6 week hx of rapid decline in cognition and gait in addition to new peripheral neuropathic symptoms since the onset of intractable N/V for 6 weeks. Per neurology, findings most consistent with acute thiamine deficiency, pt with B12 deficiency as well. Pt is nonverbal at baseline. Additional PMH includes fatty liver, HTN, pancreatitis, OSA  Pt admitted with ascending weakness and encephalopathy with extensive workup with working dx of GBS and Wernicke's s/p IVIG and ongoing thiamine repletion. Pt with ongoing ileus likely due to GBS.   Per MD pt may require re-intubation therefore will continue trickle TF for now.   11/30 extubated 11/29 trickle TF started @ 20, tube at proximal stomach  11/27 pt with sz and aspiration event. Pt vomited. NG to suction with 1.2 L out. Failed cortrak placement. Plan for small bore tube placement in IR this afternoon. After discussion with MD, family and palliative pt intubated.  11/26 TF @ 40 11/25 TF @ 30 11/23 TF started @ 20   Medications reviewed and include: erythromycin,  mag ox, MVI, miralax, senokot, thiamine, vitamin B12  Diet Order:   Diet Order            Diet NPO time specified  Diet effective now              EDUCATION NEEDS:   Not appropriate for education at this time  Skin:  Skin Assessment: Skin Integrity Issues: Skin Integrity Issues:: DTI DTI: buttocks  Last BM:  11/29 800 ml via rectal tube  Height:   Ht Readings from Last 1 Encounters:  12/12/2018 5\' 11"  (1.803 m)    Weight:   Wt Readings from Last 1 Encounters:  12/22/18 (!) 140.8 kg    Ideal Body Weight:  78.1 kg  BMI:  Body mass index is 43.29 kg/m.  Estimated Nutritional Needs:   Kcal:  2200-2500  Protein:  120-140 grams  Fluid:  >/= 2 L  Maylon Peppers RD, LDN, CNSC [phone] Pager [phone] After Hours Pager

## 2018-12-24 NOTE — Progress Notes (Deleted)
[place] Progress Note Patient Name: Aaron Vega DOB: [birth year] MRN: [id number]   Date of Service  12/24/2018  HPI/Events of Note  Patient with transient bradycardia to 30's. Now sinus tachycardia with rate = 104. Patient is a Precedex IV infusion.   eICU Interventions  Continue to monitor heart rate.      Intervention Category Major Interventions: Arrhythmia - evaluation and management  Sommer,Steven Eugene 12/24/2018, 7:03 AM

## 2018-12-24 NOTE — Procedures (Signed)
Extubation Procedure Note  Patient Details:   Name: Aaron Vega DOB: 17-Jul-1995 MRN: [id number]   Airway Documentation:    Vent end date: 12/24/18 Vent end time: 1100   Evaluation  O2 sats: stable throughout Complications: No apparent complications Patient did tolerate procedure well. Bilateral Breath Sounds: Diminished   Yes   Patient extubated to [name]. Vital signs stable at this time. No complications. RT will continue to monitor.  Mcneil Sober 12/24/2018, 11:09 AM

## 2018-12-25 ENCOUNTER — Inpatient Hospital Stay (HOSPITAL_COMMUNITY)

## 2018-12-25 DIAGNOSIS — A419 Sepsis, unspecified organism: Secondary | ICD-10-CM | POA: Diagnosis not present

## 2018-12-25 DIAGNOSIS — R6520 Severe sepsis without septic shock: Secondary | ICD-10-CM | POA: Diagnosis not present

## 2018-12-25 LAB — BASIC METABOLIC PANEL
Anion gap: 9 (ref 5–15)
BUN: 5 mg/dL — ABNORMAL LOW (ref 6–20)
CO2: 26 mmol/L (ref 22–32)
Calcium: 8.6 mg/dL — ABNORMAL LOW (ref 8.9–10.3)
Chloride: 106 mmol/L (ref 98–111)
Creatinine, Ser: <0.3 mg/dL — ABNORMAL LOW (ref 0.61–1.24)
Glucose, Bld: 89 mg/dL (ref 70–99)
Potassium: 3.1 mmol/L — ABNORMAL LOW (ref 3.5–5.1)
Sodium: 141 mmol/L (ref 135–145)

## 2018-12-25 LAB — TYPE AND SCREEN
ABO/RH(D): A POS
Antibody Screen: NEGATIVE
Unit division: 0

## 2018-12-25 LAB — CULTURE, BLOOD (ROUTINE X 2)
Culture: NO GROWTH
Culture: NO GROWTH
Special Requests: ADEQUATE

## 2018-12-25 LAB — BPAM RBC
Blood Product Expiration Date: 202012222359
ISSUE DATE / TIME: 202011300930
Unit Type and Rh: 6200

## 2018-12-25 LAB — GLUCOSE, CAPILLARY
Glucose-Capillary: 84 mg/dL (ref 70–99)
Glucose-Capillary: 91 mg/dL (ref 70–99)
Glucose-Capillary: 92 mg/dL (ref 70–99)
Glucose-Capillary: 99 mg/dL (ref 70–99)
Glucose-Capillary: 92 mg/dL (ref 70–99)
Glucose-Capillary: 86 mg/dL (ref 70–99)

## 2018-12-25 LAB — CBC
HCT: 27.8 % — ABNORMAL LOW (ref 39.0–52.0)
Hemoglobin: 9.1 g/dL — ABNORMAL LOW (ref 13.0–17.0)
MCH: 28 pg (ref 26.0–34.0)
MCHC: 32.7 g/dL (ref 30.0–36.0)
MCV: 85.5 fL (ref 80.0–100.0)
Platelets: 493 10*3/uL — ABNORMAL HIGH (ref 150–400)
RBC: 3.25 MIL/uL — ABNORMAL LOW (ref 4.22–5.81)
RDW: 14 % (ref 11.5–15.5)
WBC: 6.4 10*3/uL (ref 4.0–10.5)
nRBC: 0 % (ref 0.0–0.2)

## 2018-12-25 LAB — PHOSPHORUS: Phosphorus: 3.4 mg/dL (ref 2.5–4.6)

## 2018-12-25 LAB — MAGNESIUM: Magnesium: 1.5 mg/dL — ABNORMAL LOW (ref 1.7–2.4)

## 2018-12-25 IMAGING — DX DG ABD PORTABLE 1V
1 series · 1 of 1 positions shown · non-contrast
Comparison: [DATE].

CLINICAL DATA: Feeding tube placement.

EXAM:
PORTABLE ABDOMEN - 1 VIEW

[abdomen kub]
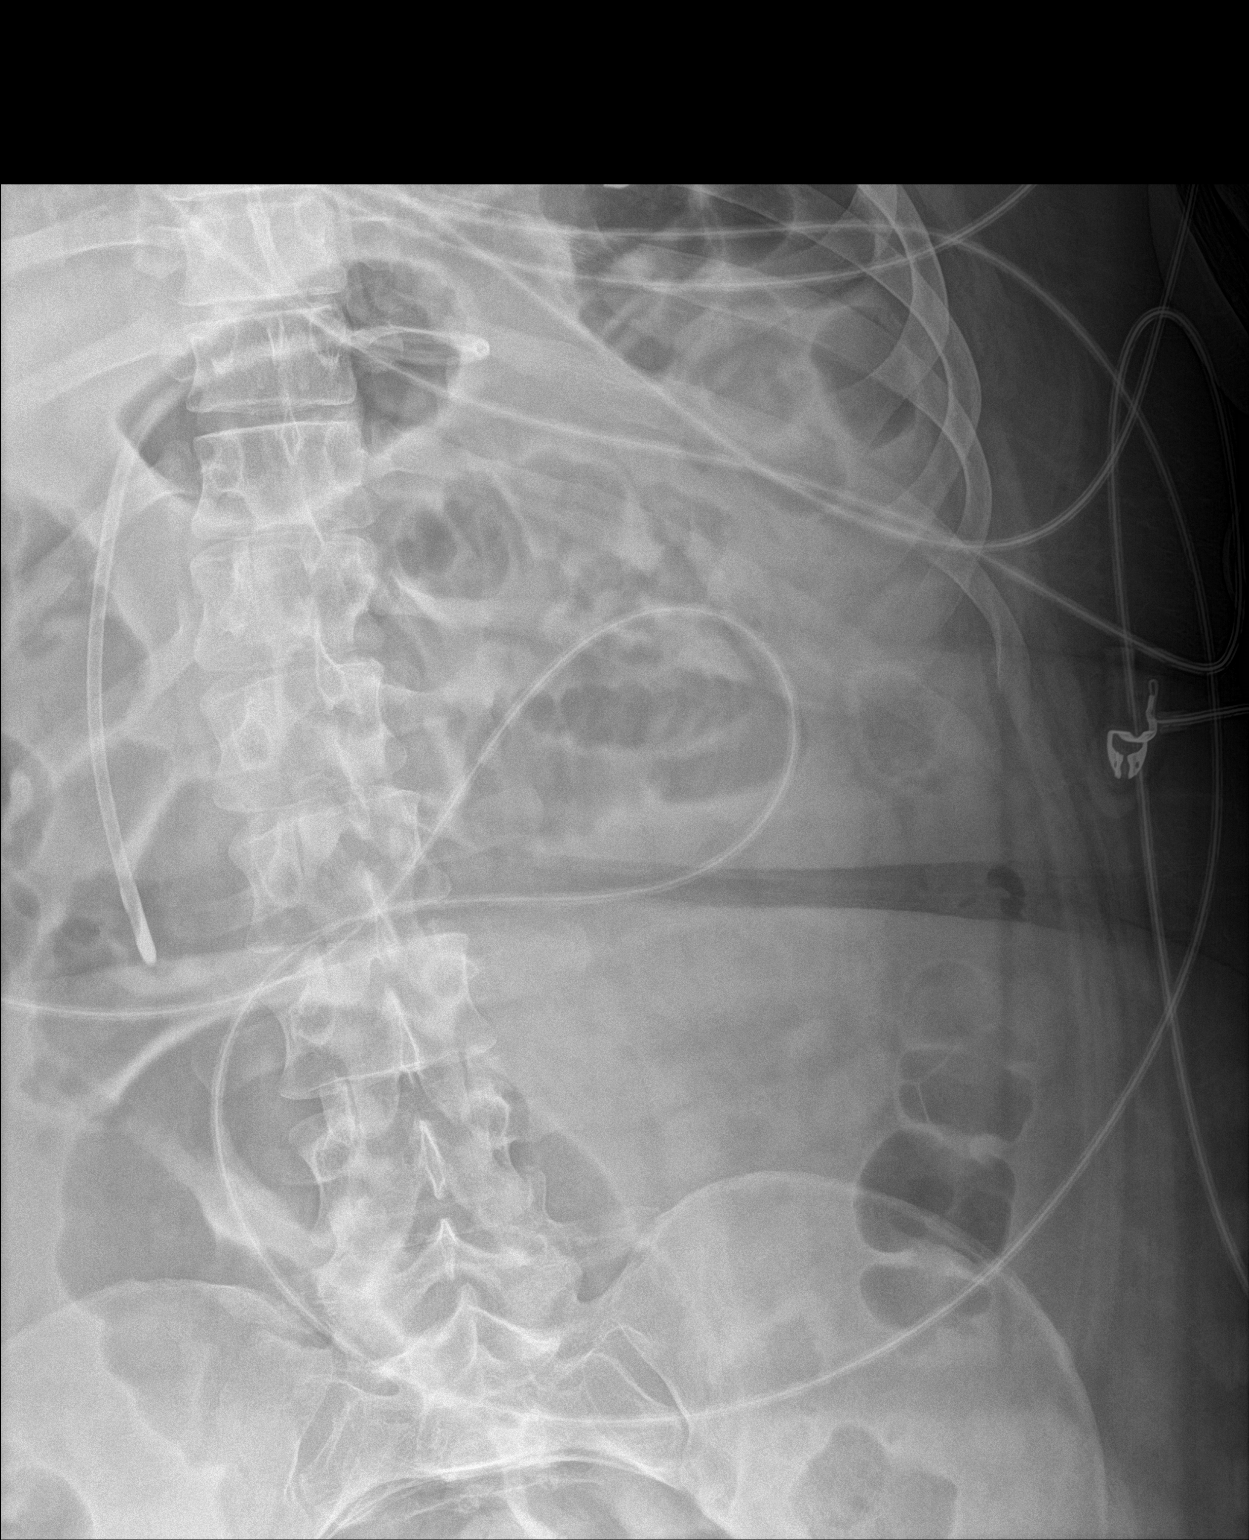

[1 of 1 positions shown; findings below may reference images not displayed]

FINDINGS: The bowel gas pattern is normal. Distal tip of feeding tube is seen
in expected position of proximal duodenum. No radio-opaque calculi
or other significant radiographic abnormality are seen.
IMPRESSION: Negative.

## 2018-12-25 MED ORDER — POTASSIUM CHLORIDE 20 MEQ/15ML (10%) PO SOLN
40.0000 meq | Freq: Two times a day (BID) | ORAL | Status: DC
  Administered 2018-12-25 – 2018-12-27 (×5): 40 meq
  Filled 2018-12-25 (×6): qty 30

## 2018-12-25 MED ORDER — MAGNESIUM SULFATE 2 GM/50ML IV SOLN
2.0000 g | Freq: Once | INTRAVENOUS | Status: AC
  Administered 2018-12-25: 14:00:00 2 g via INTRAVENOUS
  Filled 2018-12-25: qty 50

## 2018-12-25 MED ORDER — ALTEPLASE 2 MG IJ SOLR
2.0000 mg | Freq: Once | INTRAMUSCULAR | Status: AC
  Administered 2018-12-25: 04:00:00 1 mg

## 2018-12-25 MED ORDER — LABETALOL HCL 5 MG/ML IV SOLN
10.0000 mg | INTRAVENOUS | Status: DC | PRN
  Administered 2018-12-25 – 2019-01-08 (×6): 10 mg via INTRAVENOUS
  Filled 2018-12-25 (×7): qty 4

## 2018-12-25 MED ORDER — ALTEPLASE 2 MG IJ SOLR
2.0000 mg | Freq: Once | INTRAMUSCULAR | Status: AC
  Administered 2018-12-25: 04:00:00 2 mg

## 2018-12-25 MED ORDER — METOPROLOL TARTRATE 25 MG/10 ML ORAL SUSPENSION
125.0000 mg | Freq: Two times a day (BID) | ORAL | Status: DC
  Administered 2018-12-25 – 2019-01-11 (×29): 125 mg via NASOGASTRIC
  Filled 2018-12-25 (×11): qty 50
  Filled 2018-12-25: qty 60
  Filled 2018-12-25 (×16): qty 50
  Filled 2018-12-25 (×2): qty 60
  Filled 2018-12-25 (×6): qty 50
  Filled 2018-12-25: qty 60

## 2018-12-25 NOTE — Procedures (Signed)
Cortrak  Person Inserting Tube:  Rosezetta Schlatter, RD Tube Type:  Cortrak - 43 inches Tube Location:  Right nare Initial Placement:  Postpyloric Secured by: Bridle Technique Used to Measure Tube Placement:  Documented cm marking at nare/ corner of mouth Cortrak Secured At:  81 cm Procedure Comments:  Cortrak Tube Team Note:  Consult received to place a Cortrak feeding tube.    X-ray is required, abdominal x-ray has been ordered by the Cortrak team. Please confirm tube placement before using the Cortrak tube.   If the tube becomes dislodged please keep the tube and contact the Cortrak team at [email or web address] (password [email or web address]) for replacement.  If after hours and replacement cannot be delayed, place a NG tube and confirm placement with an abdominal x-ray.     Jarome Matin, MS, RD, LDN, [facility] Inpatient Clinical Dietitian Pager # [phone] After hours/weekend pager # [phone]

## 2018-12-25 NOTE — Progress Notes (Signed)
*   Late entry*  Ativan 1 mg given (12/21/18-see MAR) as ordered by MD due to patient is having seizure prior to ICU transfer; Vial was left on top of the computer and as I went back to the unit to waste the remaining medication vial was already thrown away by the [place]. Pharmacy informed, ANM to inform.

## 2018-12-25 NOTE — Progress Notes (Signed)
Nutrition Follow-up  DOCUMENTATION CODES:   Severe malnutrition in context of acute illness/injury  INTERVENTION:   Recommend increase Vital 1.5 to 55 ml/hr 60 ml Prostat BID  Provides: 2380 kcal, 149 grams protein, and 1008 ml free water.   Pt has been without adequate nutrition x 16 days. If unable to advance to goal rate recommend TPN to meet nutrition needs.   NUTRITION DIAGNOSIS:   Severe Malnutrition related to acute illness as evidenced by energy intake < or equal to 50% for > or equal to 5 days, percent weight loss.  Ongoing.   GOAL:   Patient will meet greater than or equal to 90% of their needs  Progressing.    MONITOR:   TF tolerance, Diet advancement, Labs, Weight trends  REASON FOR ASSESSMENT:   Consult Enteral/tube feeding initiation and management, Assessment of nutrition requirement/status  ASSESSMENT:   23 yo male with autism and epilepsy with 6 week hx of rapid decline in cognition and gait in addition to new peripheral neuropathic symptoms since the onset of intractable N/V for 6 weeks. Per neurology, findings most consistent with acute thiamine deficiency, pt with B12 deficiency as well. Pt is nonverbal at baseline. Additional PMH includes fatty liver, HTN, pancreatitis, OSA  Pt admitted with ascending weakness and encephalopathy with extensive workup with working dx of GBS and Wernicke's s/p IVIG and ongoing thiamine repletion. Pt with ongoing ileus likely due to GBS.   Spoke with mom and RN at bedside. Unclear why pt has NG tube in placed. Will exchange with Cortrak tube today.   12/1 Post pyloric Cortrak placed, on TF @ 20 11/30 extubated 11/29 trickle TF started @ 20, tube at proximal stomach  11/27 pt with sz and aspiration event. Pt vomited. NG to suction with 1.2 L out. Failed cortrak placement. Plan for small bore tube placement in IR this afternoon. After discussion with MD, family and palliative pt intubated.  11/26 TF @ 40 11/25 TF @  30 11/23 TF started @ 20   Medications reviewed and include: erythromycin, mag ox, MVI, miralax, senokot, thiamine, vitamin B12  Diet Order:   Diet Order            Diet NPO time specified  Diet effective now              EDUCATION NEEDS:   Not appropriate for education at this time  Skin:  Skin Assessment: Skin Integrity Issues: Skin Integrity Issues:: DTI DTI: buttocks  Last BM:  12/1 200 ml this am  Height:   Ht Readings from Last 1 Encounters:  12/13/2018 5\' 11"  (1.803 m)    Weight:   Wt Readings from Last 1 Encounters:  12/25/18 (!) 141.2 kg    Ideal Body Weight:  78.1 kg  BMI:  Body mass index is 43.42 kg/m.  Estimated Nutritional Needs:   Kcal:  2200-2500  Protein:  120-140 grams  Fluid:  >/= 2 L  Maylon Peppers RD, LDN, CNSC [phone] Pager [phone] After Hours Pager

## 2018-12-25 NOTE — Progress Notes (Signed)
SLP Cancellation Note  Patient Details Name: Aaron Vega MRN: [id number] DOB: 21-Mar-1995   Cancelled treatment:        Checked on for appropriateness for swallow assessment. RN and mom at bedside. [name] has been somewhat sleepy today; received Cortrak earlier and dietitian arrived to place bridal. Got some baseline function from mom re: swallow. Will defer this afternoon and see tomorrow.    Houston Siren 12/25/2018, 3:34 PM   Orbie Pyo Colvin Caroli.Ed Risk analyst [phone] Office [phone]

## 2018-12-25 NOTE — Progress Notes (Addendum)
NAME:  Aaron Vega, MRN:  [id number], DOB:  December 01, 1995, LOS: 17 ADMISSION DATE:  11/29/2018, CONSULTATION DATE:  12/10/2018 REFERRING MD:  Lajuana Ripple - [facility], CHIEF COMPLAINT:  Rising lactate level.  Brief History   23 year old autistic man with abdominal distension and vomiting.  History of present illness   Known for poor and declining health status from severe autism with seizures, severe constipation recently.  His QoL was significantly better prior to 08/2018 when he suffered a severe seizure at home and struck his head. Seen in ED only but has has worsening mental status since. Per chart, this was ascribed to  hypoxic ischemic encephalopathy following prolonged seizure.   Presented via ED for increasing lethargy and vomiting and decreased urine output.   Significant disconnect regarding goals of care as currently receiving hospice care at home but still full code and mother not yet willing to place limits on care.  Was unable to have MRI performed 12/12/2018 secondary to agitation and risk of respiratory depression with sedation  Post lumbar puncture 12/14/2018  Seizure, aspirated stomach contents, intubated 11/27  Past Medical History  has Tics of organic origin; Autistic disorder; Intractable nausea and vomiting; Obesity (BMI 30-39.9); Seizure disorder (HCC); Dehydration; Vomiting in adult; Reflux esophagitis; Constipation in male; Constipation; Abnormal LFTs; Hyperglycemia; Obesity, Class III, BMI 40-49.9 (morbid obesity) (HCC); Hematemesis; Severe sepsis (HCC); Ileus (HCC); Aspiration pneumonia (HCC); Community acquired pneumonia of left lower lobe of lung; Goals of care, counseling/discussion; Palliative care by specialist; DNR (do not resuscitate) discussion; Protein-calorie malnutrition, severe; Weakness of lower extremity; Aspiration into airway; Pressure injury of skin; and Fever on their problem list.   Significant Hospital Events   11/14 admission to [facility]. 11/15 increasing  lactate despite fluids. 12/10/2018 transfer to intensive care unit 12/15/2018 transferred to medical floor 12/21/2018 transferred back to ICU 11/27 intubated for sepsis, respiratory failure 11/30 Off pressors, extubated  Consults:  PCCM 11/16. Urology 12/10/2018 Neurology  Procedures:  11/09/2018 NG tube placed per interventional radiology 12/14/2018 lumbar puncture 11/27 endotracheal intubation >> 11/30 11/27 central line placement, right IJ  Significant Diagnostic Tests:  11/14 - CT abdomen/pelvis: obese, small LLL infiltrate, dilated gas filled loops of small and large bowel to rectum, no transition. No gastric distension, moderate stool burden in ascending colon. 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  No seizures.  Micro Data:  SARS-Cov2 -negative Blood cultures -no growth to date Urine culture - negative  Antimicrobials:  Cefepime 11/14 - 11/18 Flagyl 11/14-11/18 Unasyn 11/28 >   Interim history/subjective:  Extubated yesterday.  Has remained somnolent with intermittent when he follows commands.  Blood pressure heart rate higher today morning.  Objective   Blood pressure (!) 152/87, pulse (!) 114, temperature 99.2 F (37.3 C), temperature source Axillary, resp. rate (!) 27, height 5\' 11"  (1.803 m), weight (!) 141.2 kg, SpO2 100 %.        Intake/Output Summary (Last 24 hours) at 12/25/2018 1035 Last data filed at 12/25/2018 0834 Gross per 24 hour  Intake 4540.13 ml  Output 1350 ml  Net 3190.13 ml   Filed Weights   12/21/18 0540 12/22/18 0500 12/25/18 0458  Weight: (!) 140.8 kg (!) 140.8 kg (!) 141.2 kg    Examination:  Gen:      No acute distress, obese male HEENT:  EOMI, sclera anicteric Neck:     No masses; no thyromegaly  Lungs:    Clear to auscultation bilaterally; normal respiratory effort CV:         Regular rate and rhythm; no murmurs Abd:      + bowel sounds; soft, non-tender; no palpable masses, no distension Ext:    No edema; adequate peripheral perfusion Skin:      Warm and dry; no rash Neuro: Moves all extremities.  Opens eyes to stimulus.  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  S/p 5 days IVIG and high-dose thiamine -Continue thiamine replacement  Possible seizure activity - EEG's neg for epileptiform activity.  Does have hx of underlying seizures however. Continue onfi, keppra Neuro following from a distance  Ileus. Improved on abdomen x-ray 11/30 Continue erythromycin Initiate trickle feeds Replete hyokalemia  Goals of care Updated mom at bedside daily.  Told her that there is a good chance that he may deteriorate again requiring reintubation as he has difficulty clearing secretions If that were to happen again the chances of him recovering back to baseline are extremely slim  Pt was previously on hospice Discussed DNR, DNI status with mom and father over the telephone.  They would like him to be intubated 1 more time if needed.  They are not ready to make any decisions on care limitations at this point and are are not ready to meet palliative care.  The patient is critically ill with multiple organ system failure and requires high complexity decision making for assessment and support,  frequent evaluation and titration of therapies, advanced monitoring, review of radiographic studies and interpretation of complex data.   Critical Care Time devoted to patient care services, exclusive of separately billable procedures, described in this note is 35 minutes.   Marshell Garfinkel MD [facility] Pager [phone] If no answer call [phone] 12/25/2018, 10:41 AM

## 2018-12-25 DEATH — deceased

## 2018-12-26 ENCOUNTER — Inpatient Hospital Stay (HOSPITAL_COMMUNITY)

## 2018-12-26 LAB — POCT I-STAT 7, (LYTES, BLD GAS, ICA,H+H)
Acid-Base Excess: 3 mmol/L — ABNORMAL HIGH (ref 0.0–2.0)
Bicarbonate: 27.2 mmol/L (ref 20.0–28.0)
Calcium, Ion: 1.23 mmol/L (ref 1.15–1.40)
HCT: 28 % — ABNORMAL LOW (ref 39.0–52.0)
Hemoglobin: 9.5 g/dL — ABNORMAL LOW (ref 13.0–17.0)
O2 Saturation: 99 %
Patient temperature: 97.9
Potassium: 3.2 mmol/L — ABNORMAL LOW (ref 3.5–5.1)
Sodium: 141 mmol/L (ref 135–145)
TCO2: 28 mmol/L (ref 22–32)
pCO2 arterial: 39.2 mmHg (ref 32.0–48.0)
pH, Arterial: 7.448 (ref 7.350–7.450)
pO2, Arterial: 143 mmHg — ABNORMAL HIGH (ref 83.0–108.0)

## 2018-12-26 LAB — GLUCOSE, CAPILLARY
Glucose-Capillary: 100 mg/dL — ABNORMAL HIGH (ref 70–99)
Glucose-Capillary: 99 mg/dL (ref 70–99)
Glucose-Capillary: 110 mg/dL — ABNORMAL HIGH (ref 70–99)
Glucose-Capillary: 110 mg/dL — ABNORMAL HIGH (ref 70–99)
Glucose-Capillary: 95 mg/dL (ref 70–99)
Glucose-Capillary: 98 mg/dL (ref 70–99)

## 2018-12-26 LAB — CBC
HCT: 30.3 % — ABNORMAL LOW (ref 39.0–52.0)
Hemoglobin: 10 g/dL — ABNORMAL LOW (ref 13.0–17.0)
MCH: 28.2 pg (ref 26.0–34.0)
MCHC: 33 g/dL (ref 30.0–36.0)
MCV: 85.4 fL (ref 80.0–100.0)
Platelets: 620 10*3/uL — ABNORMAL HIGH (ref 150–400)
RBC: 3.55 MIL/uL — ABNORMAL LOW (ref 4.22–5.81)
RDW: 14.3 % (ref 11.5–15.5)
WBC: 8.7 10*3/uL (ref 4.0–10.5)
nRBC: 0.3 % — ABNORMAL HIGH (ref 0.0–0.2)

## 2018-12-26 LAB — PHOSPHORUS
Phosphorus: 3.4 mg/dL (ref 2.5–4.6)
Phosphorus: 2.8 mg/dL (ref 2.5–4.6)

## 2018-12-26 LAB — MAGNESIUM: Magnesium: 1.7 mg/dL (ref 1.7–2.4)

## 2018-12-26 IMAGING — DX DG CHEST 1V PORT
1 series · 1 of 1 positions shown · non-contrast
Comparison: Portable chest [DATE].

CLINICAL DATA: 23-year-old autistic male with tachypnea and
decreasing mental status.

EXAM:
PORTABLE CHEST 1 VIEW

[chest ap]
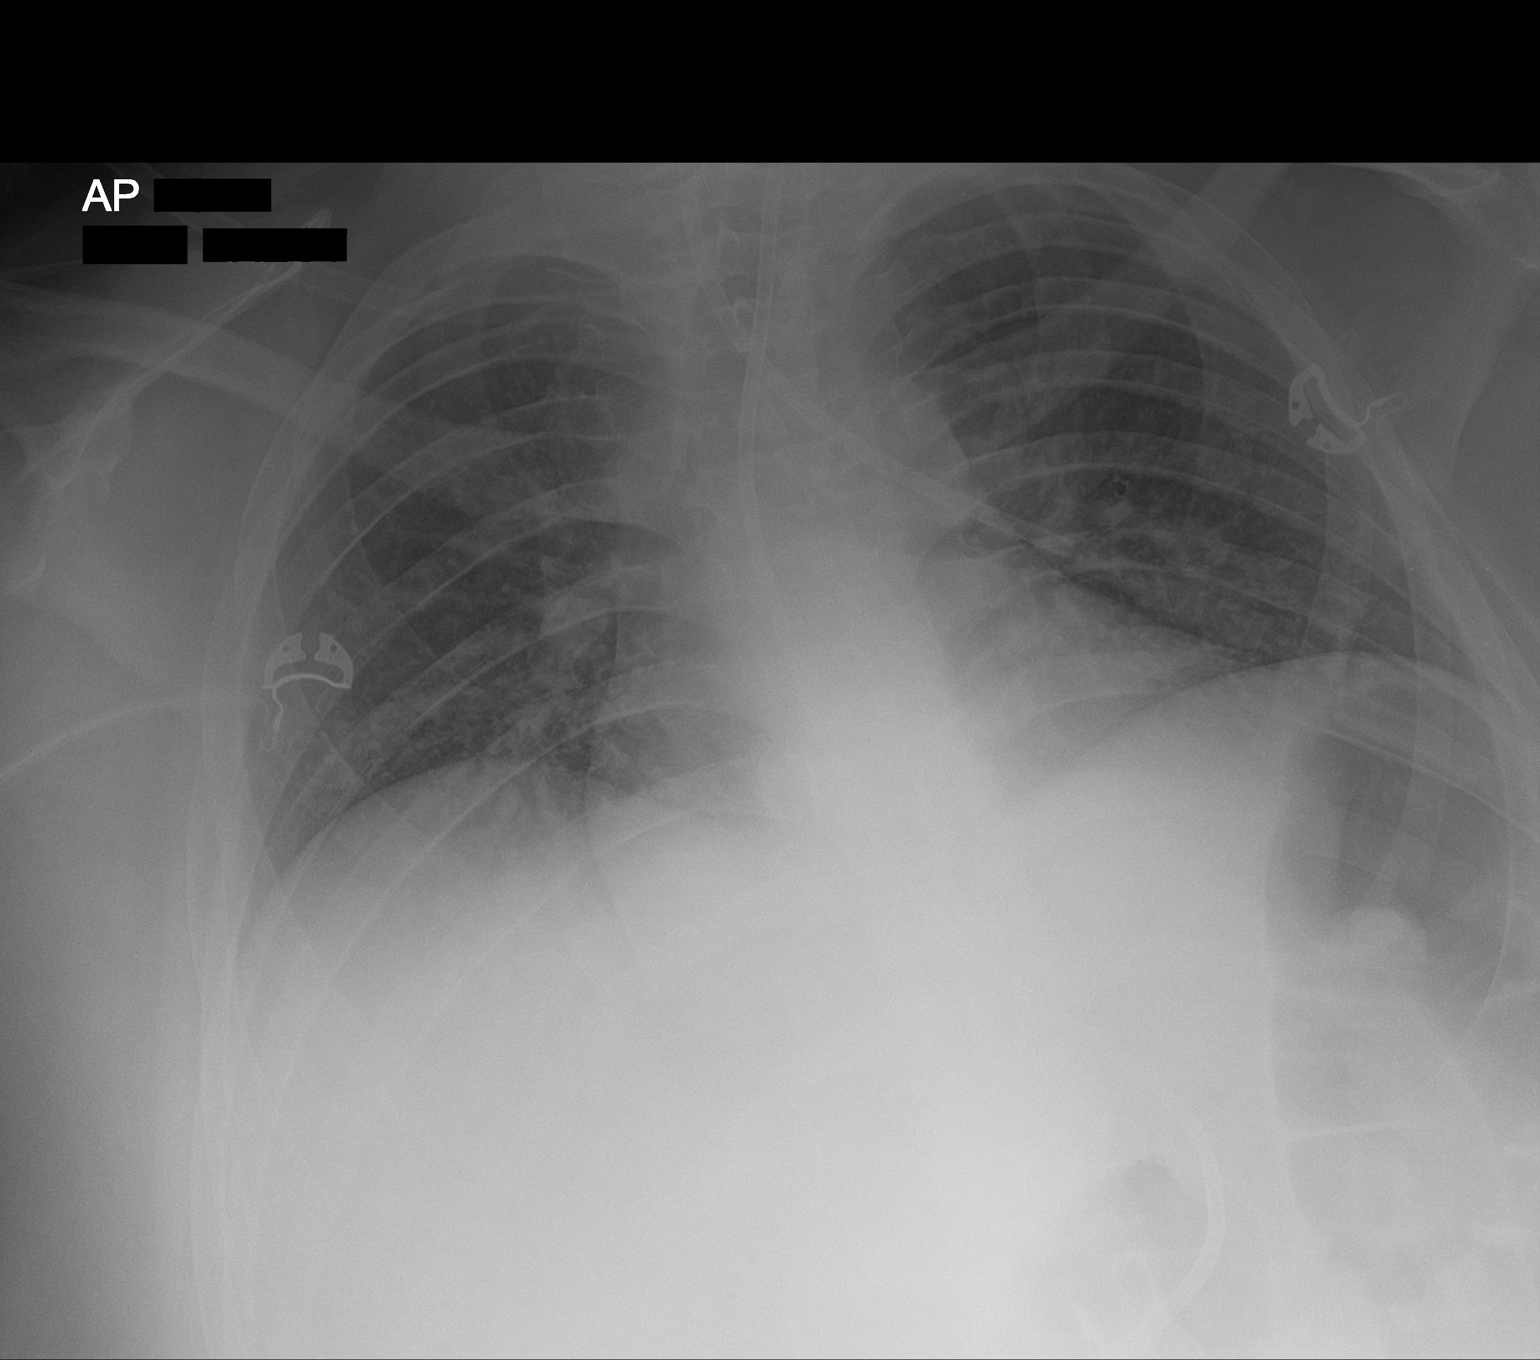

[1 of 1 positions shown; findings below may reference images not displayed]

FINDINGS: Portable AP semi upright view at [JE] hours. Extubated and NG type
tube removed since [DATE]. Lower lung volumes. Mediastinal
contours remain normal. There is an enteric feeding tube in place
which courses to the abdomen and probably crosses midline to the
right but the tip is not included.

Increased crowding of lung markings and/or pulmonary vascularity
without overt edema. No pneumothorax, pleural effusion or
consolidation. Stable visible bowel gas pattern.
IMPRESSION: 1. Extubated with lower lung volumes. No other acute cardiopulmonary
abnormality.
2. Enteric feeding tube courses to the abdomen, tip not included.

## 2018-12-26 MED ORDER — PRO-STAT SUGAR FREE PO LIQD
60.0000 mL | Freq: Two times a day (BID) | ORAL | Status: DC
  Administered 2018-12-26 – 2019-01-11 (×29): 60 mL
  Filled 2018-12-26 (×29): qty 60

## 2018-12-26 MED ORDER — VITAL 1.5 CAL PO LIQD
1000.0000 mL | ORAL | Status: DC
  Administered 2018-12-26 – 2019-01-07 (×7): 1000 mL
  Filled 2018-12-26 (×29): qty 1000

## 2018-12-26 NOTE — Progress Notes (Signed)
Nutrition Follow-up  DOCUMENTATION CODES:   Severe malnutrition in context of acute illness/injury  INTERVENTION:   Increase Vital 1.5 by 10 ml every 4 hours to 55 ml/hr 60 ml Prostat BID  Provides: 2380 kcal, 149 grams protein, and 1008 ml free water.  Monitoring phosphorus levels, WNL currently  Pt has been without adequate nutrition x 17 days. If unable to advance to goal rate/tolerate recommend TPN to meet nutrition needs.   NUTRITION DIAGNOSIS:   Severe Malnutrition related to acute illness as evidenced by energy intake < or equal to 50% for > or equal to 5 days, percent weight loss.  Ongoing.   GOAL:   Patient will meet greater than or equal to 90% of their needs  Progressing.    MONITOR:   TF tolerance, Diet advancement, Labs, Weight trends  REASON FOR ASSESSMENT:   Consult Enteral/tube feeding initiation and management, Assessment of nutrition requirement/status  ASSESSMENT:   23 yo male with autism and epilepsy with 6 week hx of rapid decline in cognition and gait in addition to new peripheral neuropathic symptoms since the onset of intractable N/V for 6 weeks. Per neurology, findings most consistent with acute thiamine deficiency, pt with B12 deficiency as well. Pt is nonverbal at baseline. Additional PMH includes fatty liver, HTN, pancreatitis, OSA  Pt admitted with ascending weakness and encephalopathy with extensive workup with working dx of GBS and Wernicke's s/p IVIG and ongoing thiamine repletion. Pt with ongoing ileus likely due to GBS.   Spoke with NP today, ok to advance TF today  12/1 Post pyloric Cortrak placed, on TF @ 20 11/30 extubated 11/29 trickle TF started @ 20, tube at proximal stomach  11/27 pt with sz and aspiration event. Pt vomited. NG to suction with 1.2 L out. Failed cortrak placement. Plan for small bore tube placement in IR this afternoon. After discussion with MD, family and palliative pt intubated.  11/26 TF @ 40 11/25 TF @  30 11/23 TF started @ 20  Medications reviewed and include: erythromycin, mag ox, MVI, miralax, senokot, thiamine, vitamin B12  Diet Order:   Diet Order            Diet NPO time specified  Diet effective now              EDUCATION NEEDS:   Not appropriate for education at this time  Skin:  Skin Assessment: Skin Integrity Issues: Skin Integrity Issues:: DTI DTI: buttocks  Last BM:  175 ml yesterday  Height:   Ht Readings from Last 1 Encounters:  12/18/2018 5\' 11"  (1.803 m)    Weight:   Wt Readings from Last 1 Encounters:  12/26/18 (!) 140.8 kg    Ideal Body Weight:  78.1 kg  BMI:  Body mass index is 43.29 kg/m.  Estimated Nutritional Needs:   Kcal:  2200-2500  Protein:  120-140 grams  Fluid:  >/= 2 L  Maylon Peppers RD, LDN, CNSC [phone] Pager [phone] After Hours Pager

## 2018-12-26 NOTE — Progress Notes (Signed)
Pt NT suctioned.  Moderate amount of pink tinged secretions obtained. Pt tolerated well.

## 2018-12-26 NOTE — Progress Notes (Addendum)
Spoke to CCM PA about removal of Foley and urecholine.  Will keep Foley today.  No new orders at this time

## 2018-12-26 NOTE — Evaluation (Signed)
Physical Therapy Evaluation Patient Details Name: Aaron Vega MRN: [id number] DOB: December 12, 1995 Today's Date: 12/26/2018   History of Present Illness  23 y.o. male with medical history significant of autism (nonverbal), Tourettes, seizures, obesity, reflux esophagitis, hypertension constipation and sleep apnea. 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 Admitted for lethargy and vomiting. Found to have acute on chronic respiratory failure and intubated 11/27-11/30, ileus, HTN, tachycardia, aspiration, ascending weakness and encephalopathy.    Clinical Impression  Pt admitted with above diagnosis. On eval, pt required +2 total assist bed mobility and +2 total assist to maintain balance sitting EOB. No active movement noted BLE, even to noxious stimuli. Pt is nonverbal. Nystagmus noted when therapist woke pt up but cleared as session progressed. Pt currently with functional limitations due to the deficits listed below (see PT Problem List). Pt will benefit from skilled PT to increase their independence and safety with mobility to allow discharge to the venue listed below.  Recommend SNF but pt's mom adamantly declining. Unsure if pt plans to return to hospice care at home upon discharge. If pt does not return to hospice, recommend maximizing [facility] services.      Follow Up Recommendations SNF(Family refusing. Maximize HHPT if discharges home.)    Equipment Recommendations  Hospital bed;Other (comment)(hoyer lift)    Recommendations for Other Services       Precautions / Restrictions Precautions Precautions: Fall;Other (comment) Precaution Comments: cortrak, rectal tube Restrictions Weight Bearing Restrictions: No      Mobility  Bed Mobility Overal bed mobility: Needs Assistance Bed Mobility: Supine to Sit;Sit to Supine;Rolling Rolling: Total assist;+2 for physical assistance    Supine to sit: Total assist;+2 for physical assistance Sit to supine: Total assist;+2 for physical assistance   General bed mobility comments: helicopter method utilized for transition to EOB. No active participation from pt.  Transfers                 General transfer comment: unable to progress beyond EOB. Maximove will be needed for bed to recliner transfer.  Ambulation/Gait                Stairs            Wheelchair Mobility    Modified Rankin (Stroke Patients Only)       Balance Overall balance assessment: Needs assistance Sitting-balance support: Feet supported;No upper extremity supported Sitting balance-[name]: Zero Sitting balance - Comments: total assist to maintain balance EOB. Pt orthostatic with sitting.                                     Pertinent Vitals/Pain Pain Assessment: Faces Faces Pain Scale: Hurts a little bit Pain Location: generalized during mobility Pain Descriptors / Indicators: Discomfort Pain Intervention(s): Monitored during session;Repositioned    Home Living Family/patient expects to be discharged to:: Private residence Living Arrangements: Parent Available Help at Discharge: Family;Available 24 hours/day Type of Home: House Home Access: Stairs to enter Entrance Stairs-Rails: None   Home Layout: Two level;Able to live on main level with bedroom/bathroom Home Equipment: Wheelchair - manual;Bedside commode      Prior Function Level of Independence: Needs assistance   Gait / Transfers Assistance Needed: nonambulatory since previous hospitalization (Oct to early Nov 2020)  ADL's / Homemaking Assistance Needed: total assist since most recent hospitalization  Comments: As of Sept 2020, pt was at independent/supervision level with mobility and ADLs.     Hand Dominance        Extremity/Trunk Assessment   Upper Extremity Assessment Upper Extremity Assessment: RUE deficits/detail;LUE  deficits/detail RUE Deficits / Details: active movement noted in flexion synergy pattern in response to noxious stimuli. R response > L LUE Deficits / Details: active movement noted in flexion synergy pattern in response to noxious stimuli. R response > L    Lower Extremity Assessment Lower Extremity Assessment: RLE deficits/detail;LLE deficits/detail RLE Deficits / Details: edema noted, PROM intact. No active movement noted. No response to noxious stimuli. LLE Deficits / Details: edema noted, PROM intact. No active movement noted. No response to noxious stimuli.       Communication   Communication: Expressive difficulties(nonverbal)  Cognition Arousal/Alertness: Lethargic Behavior During Therapy: Flat affect Overall Cognitive Status: Difficult to assess                                 General Comments: Nonverbal. Lethargic. Severe autism. Following minimal commands for SLP for swallow eval. Not following commands for movement/mobility.      General Comments General comments (skin integrity, edema, etc.): Pt's mother present during session.    Exercises     Assessment/Plan    PT Assessment Patient needs continued PT services  PT Problem List Decreased strength;Decreased mobility;Decreased safety awareness;Decreased activity tolerance;Decreased cognition;Decreased balance;Decreased knowledge of use of DME;Pain       PT Treatment Interventions DME instruction;Therapeutic exercise;Balance training;Functional mobility training;Therapeutic activities;Patient/family education;Neuromuscular re-education    PT Goals (Current goals can be found in the Care Plan section)  Acute Rehab PT Goals Patient Stated Goal: home per family PT Goal Formulation: With family Time For Goal Achievement: 01/09/19 Potential to Achieve Goals: Poor    Frequency Min 2X/week   Barriers to discharge        Co-evaluation PT/OT/SLP Co-Evaluation/Treatment: Yes Reason for Co-Treatment:  Complexity of the patient's impairments (multi-system involvement);For patient/therapist safety PT goals addressed during session: Mobility/safety with mobility;Balance         AM-PAC PT "6 Clicks" Mobility  Outcome Measure Help needed turning from your back to your side while in a flat bed without using bedrails?: Total Help needed moving from lying on your back to sitting on the side of a flat bed without using bedrails?: Total Help needed moving to and from a bed to a chair (including a wheelchair)?: Total Help needed standing up from a chair using your arms (e.g., wheelchair or bedside chair)?: Total Help needed to walk in hospital room?: Total Help needed climbing 3-5 steps with a railing? : Total 6 Click Score: 6    End of Session Equipment Utilized During Treatment: Gait belt Activity Tolerance: Patient limited by lethargy Patient left: in bed;with call bell/phone within reach;with family/visitor present Nurse Communication: Mobility status;Need for lift equipment PT Visit Diagnosis: Other abnormalities of gait and mobility (R26.89);Muscle weakness (generalized) (M62.81)    Time: 3664-4034 PT Time Calculation (min) (ACUTE ONLY): 44 min   Charges:   PT Evaluation $PT Eval High Complexity: 1 High          Lorrin Goodell, PT  Office # [phone] Pager [phone]   Lorriane Shire 12/26/2018, 2:33 PM

## 2018-12-26 NOTE — Progress Notes (Addendum)
NAME:  Aaron Vega, MRN:  [id number], DOB:  Nov 04, 1995, LOS: 18 ADMISSION DATE:  12/21/2018, CONSULTATION DATE:  12/10/2018 REFERRING MD:  Earnest Conroy - TRH, CHIEF COMPLAINT:  Rising lactate level.  Brief History   23 year old autistic man with abdominal distension and vomiting.  History of present illness   Known for poor and declining health status from severe autism with seizures, severe constipation recently.   His QoL was significantly better prior to 08/2018 when he suffered a severe seizure at home and struck his head. Seen in ED only but has has worsening mental status since. Per chart, this was ascribed to  hypoxic ischemic encephalopathy following prolonged seizure.   Presented via ED for increasing lethargy and vomiting and decreased urine output.   Significant disconnect regarding goals of care as currently receiving hospice care at home but still full code and mother not yet willing to place limits on care.  Was unable to have MRI performed 12/12/2018 secondary to agitation and risk of respiratory depression with sedation  Post lumbar puncture 12/14/2018  Seizure, aspirated stomach contents, intubated 11/27  Past Medical History  has Tics of organic origin; Autistic disorder; Intractable nausea and vomiting; Obesity (BMI 30-39.9); Seizure disorder ([place]); Dehydration; Vomiting in adult; Reflux esophagitis; Constipation in male; Constipation; Abnormal LFTs; Hyperglycemia; Obesity, Class III, BMI 40-49.9 (morbid obesity) ([place]); Hematemesis; Severe sepsis ([place]); Ileus ([place]); Aspiration pneumonia ([place]); Community acquired pneumonia of left lower lobe of lung; Goals of care, counseling/discussion; Palliative care by specialist; DNR (do not resuscitate) discussion; Protein-calorie malnutrition, severe; Weakness of lower extremity; Aspiration into airway; Pressure injury of skin; and Fever on their problem list.   Significant Hospital Events   11/14 admission to [facility]. 11/15  increasing lactate despite fluids. 11/16 transfer to intensive care unit 11/21transferred to medical floor 11/27 transferred back to ICU 11/27 intubated for sepsis, respiratory failure 11/30 Off pressors, extubated  Consults:  PCCM 11/16. Urology 12/10/2018 Neurology  Procedures:  10/16 NG tube placed per interventional radiology 11/15 Midline > 11/20 lumbar puncture 11/27 endotracheal intubation > 11/30 11/27 central line placement, right IJ > 12/1  Significant Diagnostic Tests:  11/14 > CT abdomen/pelvis: obese, small LLL infiltrate, dilated gas filled loops of small and large bowel to rectum, no transition. No gastric distension, moderate stool burden in ascending colon.  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  No seizures.  Micro Data:  SARS-Cov2 > negative Blood cultures 11/14  > negative Urine culture 11/14 > negative Urine culture 11/26 > negative Blood cultures 11/26 > negative  Antimicrobials:  Cefepime 11/14 >11/18 Flagyl 11/14 > 11/18 Unasyn 11/28  >   Interim history/subjective:  RN reports no acute events overnight, continues to have congested cough with tachypnea and tachycardia.   Objective   Blood pressure (!) 145/94, pulse (!) 109, temperature 98.1 F (36.7 C), temperature source Axillary, resp. rate (!) 33, height 5\' 11"  (1.803 m), weight (!) 140.8 kg, SpO2 100 %.        Intake/Output Summary (Last 24 hours) at 12/26/2018 0811 Last data filed at 12/26/2018 0600 Gross per 24 hour  Intake 3120.22 ml  Output 3575 ml  Net -454.78 ml   Filed Weights   12/22/18 0500 12/25/18 0458 12/26/18 0500  Weight: (!) 140.8 kg (!) 141.2 kg (!) 140.8  kg    Examination:  General: Adult male lying in bed in NAD HEENT: [place] and  cortrack in place, MM pink/moist, PERRL,  Neuro: Spontaneously opens eyes and will track around room, unable to follow commands, non focal  CV: s1s2 regular rate and rhythm, tachycardia, no murmur, rubs, or gallops,  PULM:  Congested cough with decreased ability to expectorate, mild rhonchi bilaterally bases, tachypnea GI: soft, bowel sounds active in all 4 quadrants, non-tender, non-distended, tolerating TF Extremities: warm/dry, no edema  Skin: no rashes or lesions  Assessment & Plan:   Acute on chronic hypoxemic respiratory failure  -History of obstructive sleep apnea, intolerant of CPAP  -Extubated 11/30  P:  Continue supplemental oxygen to maintain Sats greater then 92%  Head of bed elevated 30 degrees.  Follow intermittent chest x-ray and ABG.   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  -S/p 5 days IVIG and high-dose thiamine  P:  Continue Thiamine replacement  PT/OT    Possible seizure activity  -EEG's neg for epileptiform activity. Does have hx of underlying seizures.  P:  Continue Onfi and Keppra  Neuro following from a distance  Seizure precautions    Ileus Hypokalemia -Improved on abdomen x-ray 11/30  P:  Continue erythromycin, for gut mobility stimulation  Continue trickle tube feeds  Replace hypokalemia   Goals of care  Mother has been updated at bedside daily. Mom made aware that there is a  good chance that he may deteriorate again requiring reintubation as he has difficulty clearing secretions  If that were to happen again the chances of him recovering back to baseline are extremely slim    Pt was previously on hospice  Discussed DNR, DNI status with mom and father over the telephone. They would like him to be intubated 1 more time if needed. They are not ready to make any decisions on care limitations at this point and are are not ready to meet palliative care  Mom updated again at bedside 12/2 with all questions answered. She continues to remain hopeful for a full recovery.   Critical care:   Performed by: Delfin Gant  Total critical care time: 35 minutes  Critical care time was exclusive of separately billable procedures and treating other patients.  Critical care was necessary to treat or prevent imminent or life-threatening deterioration.  Critical care was time spent personally by me on the following activities: development of treatment plan with patient and/or surrogate as well as nursing, discussions with consultants, evaluation of patient's response to treatment, examination of patient, obtaining history from patient or surrogate, ordering and performing treatments and interventions, ordering and review of laboratory studies, ordering and review of radiographic studies, pulse oximetry and re-evaluation of patient's condition.   Signature:   Delfin Gant, NP-C [facility] After hours pager: [phone]. 12/26/2018, 8:30 AM

## 2018-12-26 NOTE — Progress Notes (Signed)
[facility] Progress Note Patient Name: Aaron Vega DOB: 04-18-1995 MRN: [id number]   Date of Service  12/26/2018  HPI/Events of Note  Nursing notes that the patient is less alert and that his respiratory exam sounds congested. RR = 30-35. Sat = 100%. Neb Rx given and patient NT suctioned by nurse.   eICU Interventions  Will order: 1. ABG STAT. 2. Portable CXR STAT. 3. Will ask ground team to evaluate at bedside.      Intervention Category Major Interventions: Change in mental status - evaluation and management;Other:  Lysle Dingwall 12/26/2018, 12:52 AM

## 2018-12-26 NOTE — Evaluation (Addendum)
Occupational Therapy Evaluation Patient Details Name: Aaron Vega MRN: [id number] DOB: 04/12/1995 Today's Date: 12/26/2018    History of Present Illness 23 y.o. male with medical history significant of autism (nonverbal), Tourettes, seizures, obesity, reflux esophagitis, hypertension constipation and sleep apnea. 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 Admitted for lethargy and vomiting. Found to have acute on chronic respiratory failure and intubated 11/27-11/30, ileus, HTN, tachycardia, aspiration, ascending weakness and encephalopathy.   Clinical Impression   PT admitted with complex medical history with recent d/c from [place] and now readmission with respiratory failure ileus, tachycardia aspiration and encephalopathy. Neurology notes indicate concern for secondary GB. Pt currently with functional limitiations due to the deficits listed below (see OT problem list). Pt currently total +2 (A) total (A) to sit EOB with bil LE blocked due to no core activation. Pt does active neck only during session in all planes. Pt attempting to engage in oral intake with SLP with poor lip closure.  Pt will benefit from skilled OT to increase their independence and safety with adls and balance to allow discharge will be home with full services to decrease caregiver burden and decrease risk for skin break down for patient.  Recommend bil resting hand splints for night time use only to be ordered from [facility] size large Recommend prevalon boots for bil feet to decrease risk of skin break down at heels and maintain ankle flexion order from [facility]    Follow Up Recommendations  (TBA - will d/c home at this time per mother)    Equipment Recommendations  Hospital bed;Other (comment)(AIR MATTRESS, AIDE, RN,)    Recommendations for Other Services Other (comment)(palliative/ ortho tech for splinting  )     Precautions / Restrictions Precautions Precautions: Fall;Other (comment) Precaution Comments: cortrak, rectal tube      Mobility Bed Mobility Overal bed mobility: Needs Assistance Bed Mobility: Supine to Sit;Sit to Supine;Rolling Rolling: Total assist;+2 for physical assistance   Supine to sit: Total assist;+2 for physical assistance Sit to supine: Total assist;+2 for physical assistance   General bed mobility comments: helicopter method utilized for transition to EOB. No active participation from pt.  Transfers Overall transfer level: Needs assistance Equipment used: Rolling walker (2 wheeled) Transfers: Squat Pivot Transfers Sit to Stand: Min assist;+2 physical assistance;+2 safety/equipment;From elevated surface   Squat pivot transfers: Total assist     General transfer comment: unable to progress beyond EOB. Maximove will be needed for bed to recliner transfer.    Balance Overall balance assessment: Needs assistance Sitting-balance support: Feet supported;No upper extremity supported Sitting balance-[name]: Zero Sitting balance - Comments: total assist to maintain balance EOB. Pt orthostatic with sitting.   Standing balance support: Bilateral upper extremity supported Standing balance-[name]: Fair Standing balance comment: unable                           ADL either performed or assessed with clinical judgement   ADL Overall ADL's : Needs assistance/impaired Eating/Feeding: NPO   Grooming: Total assistance   Upper Body Bathing: Total assistance   Lower Body Bathing: Total assistance   Upper Body Dressing : Total assistance   Lower Body Dressing: Total assistance                 General ADL Comments: pt total (A) for all adls. pt does attempt to engage in oral  task with head extension and neck flexion toward object. pt opening mouth appropriately.      Vision   Additional Comments: noted to have beating movement to  eyes at all times in all positions if aroused     Perception     Praxis      Pertinent Vitals/Pain Pain Assessment: Faces Faces Pain Scale: Hurts a little bit Pain Location: generalized during mobility Pain Descriptors / Indicators: Discomfort     Hand Dominance     Extremity/Trunk Assessment Upper Extremity Assessment Upper Extremity Assessment: RUE deficits/detail;LUE deficits/detail RUE Deficits / Details: active movement noted in flexion synergy pattern in response to noxious stimuli. R response > L LUE Deficits / Details: active movement noted in flexion synergy pattern in response to noxious stimuli. R response > L   Lower Extremity Assessment Lower Extremity Assessment: RLE deficits/detail;LLE deficits/detail RLE Deficits / Details: edema noted, PROM intact. No active movement noted. No response to noxious stimuli. LLE Deficits / Details: edema noted, PROM intact. No active movement noted. No response to noxious stimuli.   Cervical / Trunk Assessment Cervical / Trunk Assessment: Kyphotic   Communication Communication Communication: Expressive difficulties(nonverbal)   Cognition Arousal/Alertness: Lethargic Behavior During Therapy: Flat affect Overall Cognitive Status: Difficult to assess Area of Impairment: Following commands                       Following Commands: Follows one step commands inconsistently       General Comments: Nonverbal. Lethargic. Severe autism. Following minimal commands for SLP for swallow eval. Not following commands for movement/mobility. Pt lifting head to the Left with staff entry at EOB. pt lifting head to attempt at oral intake   General Comments  pt noted to have decrease BP with EOB sitting but remained aroused and engaged. Mother present during session and verbalized patient will d/c home regardless of progression with therapy. family will need all services maximized for home care and 24/7 (A)    Exercises     Shoulder  Instructions      Home Living Family/patient expects to be discharged to:: Private residence Living Arrangements: Parent Available Help at Discharge: Family;Available 24 hours/day Type of Home: House Home Access: Stairs to enter [employer] of Steps: Pt with 3 platform like steps (up onto curb, 1 onto porch, 1 into house) Entrance Stairs-Rails: None Home Layout: Two level;Able to live on main level with bedroom/bathroom Alternate Level Stairs-Number of Steps: Pt's bedroom is upstairs but can stay on first floor   Bathroom Shower/Tub: Tub/shower unit   Bathroom Toilet: Standard Bathroom Accessibility: No   Home Equipment: Wheelchair - manual;Bedside commode   Additional Comments: mother report patient has had no activation of BIL LE since previous admission and minimal movement of bil UE      Prior Functioning/Environment Level of Independence: Needs assistance  Gait / Transfers Assistance Needed: nonambulatory since previous hospitalization (Oct to early Nov 2020) ADL's / Homemaking Assistance Needed: total assist since most recent hospitalization   Comments: As of Sept 2020, pt was at independent/supervision level with mobility and ADLs.        OT Problem List: Decreased strength;Decreased range of motion;Decreased activity tolerance;Impaired balance (sitting and/or standing);Decreased safety awareness;Decreased knowledge of use of DME or AE;Decreased knowledge of precautions;Obesity;Impaired UE functional use;Increased edema;Cardiopulmonary status limiting activity;Decreased cognition;Decreased coordination;Impaired sensation      OT Treatment/Interventions: Self-care/ADL training;Therapeutic exercise;Neuromuscular education;DME and/or AE instruction;Manual therapy;Modalities;Therapeutic activities;Cognitive remediation/compensation;Visual/perceptual remediation/compensation;Patient/family education;Balance training    OT Goals(Current  goals can be found in the care  plan section) Acute Rehab OT Goals Patient Stated Goal: home per family OT Goal Formulation: With family Time For Goal Achievement: 01/09/19 Potential to Achieve Goals: Good  OT Frequency: Min 2X/week   Barriers to D/C:    mother can help but will need increased (A) as patient is 140 kg and requires frequent position changes       Co-evaluation PT/OT/SLP Co-Evaluation/Treatment: Yes Reason for Co-Treatment: Complexity of the patient's impairments (multi-system involvement);Necessary to address cognition/behavior during functional activity;To address functional/ADL transfers;For patient/therapist safety   OT goals addressed during session: ADL's and self-care;Strengthening/ROM      AM-PAC OT "6 Clicks" Daily Activity     Outcome Measure Help from another person eating meals?: Total Help from another person taking care of personal grooming?: Total Help from another person toileting, which includes using toliet, bedpan, or urinal?: Total Help from another person bathing (including washing, rinsing, drying)?: Total Help from another person to put on and taking off regular upper body clothing?: Total Help from another person to put on and taking off regular lower body clothing?: Total 6 Click Score: 6   End of Session Nurse Communication: Mobility status;Precautions  Activity Tolerance: Patient tolerated treatment well Patient left: with call bell/phone within reach;in bed;with family/visitor present;with bed alarm set  OT Visit Diagnosis: Unsteadiness on feet (R26.81);Muscle weakness (generalized) (M62.81);Adult, failure to thrive (R62.7);History of falling (Z91.81)                Time: 0349-1791 OT Time Calculation (min): 44 min Charges:  OT General Charges $OT Visit: 1 Visit OT Evaluation $OT Eval High Complexity: 1 High   Brynn, OTR/L  Acute Rehabilitation Services Pager: [phone] Office: [phone] .   Jeri Modena 12/26/2018, 9:21 PM

## 2018-12-26 NOTE — Evaluation (Signed)
Clinical/Bedside Swallow Evaluation Patient Details  Name: Aaron Vega MRN: [id number] Date of Birth: [birth year]  Today's Date: 12/26/2018 Time: SLP Start Time (ACUTE ONLY): 7741 SLP Stop Time (ACUTE ONLY): 1113 SLP Time Calculation (min) (ACUTE ONLY): 34 min  Past Medical History:  Past Medical History:  Diagnosis Date  . Autistic disorder, current or active state   . Fatty liver   . Hypertension   . Obesity   . Pancreatitis    at age 22 years  . Pneumonia   . Pre-diabetes   . Seizures ([place])    as of 11/23/15 - no seizures for 3 month  . Sleep apnea    not on cpap (can't tolerate)  . Tics of organic origin   . Tourette syndrome    Past Surgical History:  Past Surgical History:  Procedure Laterality Date  . BIOPSY  11/08/2018   Procedure: BIOPSY;  Surgeon: Jerene Bears, MD;  Location: [facility];  Service: Gastroenterology;;  . ESOPHAGOGASTRODUODENOSCOPY (EGD) WITH PROPOFOL N/A 11/08/2018   Procedure: ESOPHAGOGASTRODUODENOSCOPY (EGD) WITH PROPOFOL;  Surgeon: Jerene Bears, MD;  Location: [facility];  Service: Gastroenterology;  Laterality: N/A;  . FLEXIBLE SIGMOIDOSCOPY N/A 11/21/2018   Procedure: FLEXIBLE SIGMOIDOSCOPY;  Surgeon: Irving Copas., MD;  Location: [facility];  Service: Gastroenterology;  Laterality: N/A;  . IMPACTION REMOVAL  11/21/2018   Procedure: IMPACTION REMOVAL;  Surgeon: Irving Copas., MD;  Location: [facility];  Service: Gastroenterology;;  . RADIOLOGY WITH ANESTHESIA N/A 11/24/2015   Procedure: CT SCAN ABDOMEN AND PELVIS WITH CONTRAST;  Surgeon: Medication Radiologist, MD;  Location: [facility];  Service: Radiology;  Laterality: N/A;  . TONSILLECTOMY     and adenoidectomy   HPI:  Aaron Vega is a 23 y.o. male with medical history significant of autism (nonverbal), Tourettes, seizures, obesity, reflux esophagitis, hypertension constipation and sleep apnea. Per chart, sInce August pt has had seizures, and most likely hypoxic  encephalopathy and has had progr essive decline and currently is under hospice care. Admitted for lethargy and vomiting. Found to have acute on chronic respiratory failure and intubated 11/27-11/30, ileus, HTN, tachycardia, aspiration, ascending weakness and encephalopathy. CXR No other acute cardiopulmonary abnormality. No other ST notes found.   Assessment / Plan / Recommendation Clinical Impression  Swallow evaluation performed with PT/OT supporting Aaron Vega on edge of bed. He is presently unable to consume solids or liquids given inability to orally control boluses and transit. He was engaged and activated forward head movement toward spoon/cup and initiated labial movement although closure was incomplete on utensil and straw. Initiated lingual movement and mastication for 3-4 seconds before ice fell anteriorally and suctioning required to remove applesauce. Mom observed part of session and provided education and plan of care for swallow. ST will continue to facilitate safe oral intake.    SLP Visit Diagnosis: Dysphagia, unspecified (R13.10)    Aspiration Risk  Severe aspiration risk    Diet Recommendation NPO   Medication Administration: Via alternative means    Other  Recommendations Oral Care Recommendations: Oral care QID   Follow up Recommendations 24 hour supervision/assistance      Frequency and Duration min 2x/week  2 weeks       Prognosis Prognosis for Safe Diet Advancement: Fair Barriers to Reach Goals: Cognitive deficits;Severity of deficits      Swallow Study   General HPI: Aaron Vega is a 23 y.o. male with medical history significant of autism (nonverbal), Tourettes, seizures, obesity, reflux esophagitis, hypertension constipation and sleep apnea. Per chart,  sInce August pt has had seizures, and most likely hypoxic encephalopathy and has had progr essive decline and currently is under hospice care. Admitted for lethargy and vomiting. Found to have acute on chronic  respiratory failure and intubated 11/27-11/30, ileus, HTN, tachycardia, aspiration, ascending weakness and encephalopathy. CXR No other acute cardiopulmonary abnormality. No other ST notes found. Type of Study: Bedside Swallow Evaluation Previous Swallow Assessment: (none) Diet Prior to this Study: NPO Temperature Spikes Noted: Yes Respiratory Status: Nasal cannula History of Recent Intubation: Yes Length of Intubations (days): 4 days Date extubated: 12/24/18 Behavior/Cognition: Requires cueing;Lethargic/Drowsy;Distractible Oral Cavity Assessment: Dry Oral Care Completed by SLP: Yes Oral Cavity - Dentition: Adequate natural dentition Self-Feeding Abilities: Total assist Patient Positioning: Other (comment)(edge of bed) Baseline Vocal Quality: (pt nonverbal, no vocalizations) Volitional Cough: Cognitively unable to elicit Volitional Swallow: Unable to elicit    Oral/Motor/Sensory Function Overall Oral Motor/Sensory Function: Other (comment)(flaccid, open mouth posture )   Ice Chips Ice chips: Impaired Oral Phase Impairments: Reduced labial seal;Reduced lingual movement/coordination Oral Phase Functional Implications: Oral holding;Left anterior spillage;Right anterior spillage Pharyngeal Phase Impairments: (no swallow)   Thin Liquid Thin Liquid: Impaired Presentation: Spoon Oral Phase Impairments: Reduced labial seal;Reduced lingual movement/coordination Oral Phase Functional Implications: Right anterior spillage;Left anterior spillage Pharyngeal  Phase Impairments: (no swallow)    Nectar Thick Nectar Thick Liquid: Not tested   Honey Thick Honey Thick Liquid: Not tested   Puree Puree: Impaired Presentation: Spoon Oral Phase Impairments: Reduced labial seal;Reduced lingual movement/coordination Oral Phase Functional Implications: Oral holding Pharyngeal Phase Impairments: (no swallow)   Solid     Solid: Not tested      Aaron Vega 12/26/2018,1:18 PM  Breck Coons  [place].Ed Nurse, children's [phone] Office [phone]

## 2018-12-27 DIAGNOSIS — R6520 Severe sepsis without septic shock: Secondary | ICD-10-CM | POA: Diagnosis not present

## 2018-12-27 DIAGNOSIS — A419 Sepsis, unspecified organism: Secondary | ICD-10-CM | POA: Diagnosis not present

## 2018-12-27 LAB — BASIC METABOLIC PANEL
Anion gap: 8 (ref 5–15)
BUN: 9 mg/dL (ref 6–20)
CO2: 28 mmol/L (ref 22–32)
Calcium: 9 mg/dL (ref 8.9–10.3)
Chloride: 104 mmol/L (ref 98–111)
Creatinine, Ser: 0.34 mg/dL — ABNORMAL LOW (ref 0.61–1.24)
GFR calc Af Amer: >60 mL/min (ref >60)
GFR calc non Af Amer: >60 mL/min (ref >60)
Glucose, Bld: 90 mg/dL (ref 70–99)
Potassium: 4.2 mmol/L (ref 3.5–5.1)
Sodium: 140 mmol/L (ref 135–145)

## 2018-12-27 LAB — GLUCOSE, CAPILLARY
Glucose-Capillary: 92 mg/dL (ref 70–99)
Glucose-Capillary: 90 mg/dL (ref 70–99)
Glucose-Capillary: 93 mg/dL (ref 70–99)
Glucose-Capillary: 87 mg/dL (ref 70–99)
Glucose-Capillary: 95 mg/dL (ref 70–99)
Glucose-Capillary: 105 mg/dL — ABNORMAL HIGH (ref 70–99)

## 2018-12-27 LAB — CBC
HCT: 33.9 % — ABNORMAL LOW (ref 39.0–52.0)
Hemoglobin: 10.6 g/dL — ABNORMAL LOW (ref 13.0–17.0)
MCH: 27.8 pg (ref 26.0–34.0)
MCHC: 31.3 g/dL (ref 30.0–36.0)
MCV: 89 fL (ref 80.0–100.0)
Platelets: 787 10*3/uL — ABNORMAL HIGH (ref 150–400)
RBC: 3.81 MIL/uL — ABNORMAL LOW (ref 4.22–5.81)
RDW: 16.6 % — ABNORMAL HIGH (ref 11.5–15.5)
WBC: 12.6 10*3/uL — ABNORMAL HIGH (ref 4.0–10.5)
nRBC: 0.6 % — ABNORMAL HIGH (ref 0.0–0.2)

## 2018-12-27 LAB — PHOSPHORUS
Phosphorus: 3.1 mg/dL (ref 2.5–4.6)
Phosphorus: 3.3 mg/dL (ref 2.5–4.6)

## 2018-12-27 MED ORDER — TAMSULOSIN HCL 0.4 MG PO CAPS
0.4000 mg | ORAL_CAPSULE | Freq: Every day | ORAL | Status: DC
  Administered 2018-12-28 – 2019-01-09 (×10): 0.4 mg via ORAL
  Filled 2018-12-27 (×13): qty 1

## 2018-12-27 MED ORDER — LISINOPRIL 20 MG PO TABS
20.0000 mg | ORAL_TABLET | Freq: Once | ORAL | Status: DC
  Filled 2018-12-27: qty 1

## 2018-12-27 MED ORDER — LISINOPRIL 20 MG PO TABS
40.0000 mg | ORAL_TABLET | Freq: Every day | ORAL | Status: DC
  Administered 2018-12-28 – 2019-01-09 (×10): 40 mg via NASOGASTRIC
  Filled 2018-12-27 (×11): qty 2

## 2018-12-27 MED ORDER — LISINOPRIL 20 MG PO TABS
20.0000 mg | ORAL_TABLET | Freq: Once | ORAL | Status: AC
  Administered 2018-12-27: 10:00:00 20 mg via NASOGASTRIC

## 2018-12-27 MED ORDER — POTASSIUM CHLORIDE 20 MEQ/15ML (10%) PO SOLN
40.0000 meq | Freq: Every day | ORAL | Status: DC
  Administered 2018-12-28 – 2019-01-11 (×14): 40 meq
  Filled 2018-12-27 (×16): qty 30

## 2018-12-27 NOTE — Progress Notes (Signed)
Palliative:   [name], Aaron Vega, is lying quietly in bed.  He appears acutely/chronically ill and quite frail.  He will only open his eyes briefly when I rub his chest.  He will quickly close his eyes.  He does not respond to me in any meaningful way (nonverbal at baseline).  There is no family present at this time.  Chart reviewed.  Discussion with bedside nursing staff related to patient needs.  Plan:   At this point, full code/full scope.  PMT to continue to provide support to patient, family, staff during this difficult time.  33 minutes Quinn Axe, NP Palliative Medicine Team Team Phone # [phone] Greater than 50% of this time was spent counseling and coordinating care related to the above assessment and plan

## 2018-12-27 NOTE — Progress Notes (Signed)
Chest PT not performed this round. RN is bathing patient. Will continue next rounds.

## 2018-12-27 NOTE — Progress Notes (Signed)
NAME:  Aaron Vega, MRN:  [id number], DOB:  [birth year], LOS: 19 ADMISSION DATE:  12/21/2018, CONSULTATION DATE:  12/10/2018 REFERRING MD:  Lajuana Ripple - TR[facility], CHIEF COMPLAINT:  Rising lactate level.  Brief History   23 year old autistic man with abdominal distension and vomiting.  History of present illness   Known for poor and declining health status from severe autism with seizures, severe constipation recently.   His QoL was significantly better prior to 08/2018 when he suffered a severe seizure at home and struck his head. Seen in ED only but has has worsening mental status since. Per chart, this was ascribed to  hypoxic ischemic encephalopathy following prolonged seizure.   Presented via ED for increasing lethargy and vomiting and decreased urine output.   Significant disconnect regarding goals of care as currently receiving hospice care at home but still full code and mother not yet willing to place limits on care.  Was unable to have MRI performed 12/12/2018 secondary to agitation and risk of respiratory depression with sedation  Post lumbar puncture 12/14/2018  Seizure, aspirated stomach contents, intubated 11/27  Past Medical History  has Tics of organic origin; Autistic disorder; Intractable nausea and vomiting; Obesity (BMI 30-39.9); Seizure disorder (HCC); Dehydration; Vomiting in adult; Reflux esophagitis; Constipation in male; Constipation; Abnormal LFTs; Hyperglycemia; Obesity, Class III, BMI 40-49.9 (morbid obesity) (HCC); Hematemesis; Severe sepsis (HCC); Ileus (HCC); Aspiration pneumonia (HCC); Community acquired pneumonia of left lower lobe of lung; Goals of care, counseling/discussion; Palliative care by specialist; DNR (do not resuscitate) discussion; Protein-calorie malnutrition, severe; Weakness of lower extremity; Aspiration into airway; Pressure injury of skin; and Fever on their problem list.   Significant Hospital Events   11/14 admission to [facility]. 11/15  increasing lactate despite fluids. 11/16 transfer to intensive care unit 11/21transferred to medical floor 11/27 transferred back to ICU 11/27 intubated for sepsis, respiratory failure 11/30 Off pressors, extubated  Consults:  PCCM 11/16. Urology 12/10/2018 Neurology  Procedures:  10/16 NG tube placed per interventional radiology 11/15 Midline > 11/20 lumbar puncture 11/27 endotracheal intubation > 11/30 11/27 central line placement, right IJ > 12/1  Significant Diagnostic Tests:  11/14 > CT abdomen/pelvis: obese, small LLL infiltrate, dilated gas filled loops of small and large bowel to rectum, no transition. No gastric distension, moderate stool burden in ascending colon.  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  No seizures.  Micro Data:  SARS-Cov2 > negative Blood cultures 11/14  > negative Urine culture 11/14 > negative Urine culture 11/26 > negative Blood cultures 11/26 > negative  Antimicrobials:  Cefepime 11/14 >11/18 Flagyl 11/14 > 11/18 Unasyn 11/28  >   Interim history/subjective:  No acute events overnight, remains very deconditioned with limited participation in therapies and continued recommendations for SNF at discharge  Objective   Blood pressure (!) 145/107, pulse (!) 126, temperature 98.5 F (36.9 C), temperature source Axillary, resp. rate (!) 36, height 5\' 11"  (1.803 m), weight (!) 140.5 kg, SpO2 98 %.        Intake/Output Summary (Last 24 hours) at 12/27/2018 0935 Last data filed at 12/27/2018 0900 Gross per 24 hour  Intake 1795.36 ml  Output 1535 ml  Net 260.36 ml   Filed Weights   12/25/18 0458 12/26/18 0500 12/27/18 0425  Weight: (!) 141.2 kg (!) 140.8  kg (!) 140.5 kg    Examination:  General:Adult male  lying in bed, in NAD HEENT: [place]/AT, MM pink/moist, PERRL,  Neuro: Continues to track  Movement in room, non-verbale at baseline with hx of autism  CV: tachycardia s1s2 regular rate and rhythm, no murmur, rubs, or gallops,  PULM:  Slight rhonchi in bilateral bases but improved from day prior  GI: soft, bowel sounds active in all 4 quadrants, non-tender, non-distended, tolerating TF through cortrack  Extremities: warm/dry, no edema, bilateral foot drop  Skin: no rashes or lesions   Assessment & Plan:   Acute on chronic hypoxemic respiratory failure  -History of obstructive sleep apnea, intolerant of CPAP  -Extubated 11/30  P:  Wean supplemental oxygen for goal of RA Head of bed elevated to 30 degrees Ensure adequate pulmonary hygiene Chest PT to help clear secretions Continue as needed NT suctioning   Aspiration  P:  SLP continue to follow Completed IV antibiotics through 12/2 Aspiration precautions  NPO, continue tube feeds Will likely need PEG tube prior to discharge   Hypertension, tachycardia  P:  Increase home lisinopril  Continue beta blocker therapy   Diarrhea  P:  Rectal tube  Continue metamucil    Ascending weakness and encephalopathy  -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  -S/p 5 days IVIG and high-dose thiamine  P:  Continue Thiamine replacement  PT/OT    Possible seizure activity  -EEG's neg for epileptiform activity. Does have hx of underlying seizures.  P:  Continue Onifil and Keppra  Neuro follow in the distance Seizure precaution    Ileus Hypokalemia -Improved on abdomen x-ray 11/30  P:  Continue erythromycin for gut mobility Continue tube feeds Replace electrolytes as needed   Goals of care  Mother has been updated at bedside daily. Mom made aware that there is a good chance that he may deteriorate again requiring reintubation as he has difficulty clearing secretions. If that were to happen again the chances of him recovering back to  baseline are extremely slim    Pt was previously on hospice  Discussed DNR, DNI status with mom and father over the telephone. They would like him to be intubated 1 more time if needed. They are not ready to make any decisions on care limitations at this point and are are not ready to meet palliative care  Mom updated again at bedside 12/3 with questions regarding progression and next steps.   Signature:   Johnsie Cancel, NP-C [facility] After hours pager: [phone]. 12/27/2018, 9:35 AM

## 2018-12-27 NOTE — Progress Notes (Signed)
SLP Cancellation Note  Patient Details Name: Aaron Vega MRN: [id number] DOB: 1995/10/07   Cancelled treatment:        RN reports pt has not been adequately alert today. Will defer dysphagia tx.    [place] Siren 12/27/2018, 4:07 PM

## 2018-12-27 NOTE — Progress Notes (Signed)
Will remove foley tomorrow per CCM NP

## 2018-12-27 NOTE — Progress Notes (Signed)
Nutrition Follow-up  DOCUMENTATION CODES:   Severe malnutrition in context of acute illness/injury  INTERVENTION:   Vital 1.5 @ 55 ml/hr 60 ml Prostat BID  Provides: 2380 kcal, 149 grams protein, and 1008 ml free water.    NUTRITION DIAGNOSIS:   Severe Malnutrition related to acute illness as evidenced by energy intake < or equal to 50% for > or equal to 5 days, percent weight loss.  Ongoing.   GOAL:   Patient will meet greater than or equal to 90% of their needs  Met.    MONITOR:   TF tolerance, Diet advancement, Labs, Weight trends  REASON FOR ASSESSMENT:   Consult Enteral/tube feeding initiation and management, Assessment of nutrition requirement/status  ASSESSMENT:   23 yo male with autism and epilepsy with 6 week hx of rapid decline in cognition and gait in addition to new peripheral neuropathic symptoms since the onset of intractable N/V for 6 weeks. Per neurology, findings most consistent with acute thiamine deficiency, pt with B12 deficiency as well. Pt is nonverbal at baseline. Additional PMH includes fatty liver, HTN, pancreatitis, OSA  Pt admitted with ascending weakness and encephalopathy with extensive workup with working dx of GBS and Wernicke's s/p IVIG and ongoing thiamine repletion. Pt with ongoing ileus likely due to GBS.    Per RN pt tolerating TF at goal without issues. This tube is post pyloric and pt has yet to demonstrate tolerance to gastric feedings. Per CCM will need SNF and permanent feeding tube prior to d/c.   12/2 TF advanced to goal 12/1 Post pyloric Cortrak placed, on TF @ 20 11/30 extubated 11/29 trickle TF started @ 20, tube at proximal stomach  11/27 pt with sz and aspiration event. Pt vomited. NG to suction with 1.2 L out. Failed cortrak placement. Plan for small bore tube placement in IR this afternoon. After discussion with MD, family and palliative pt intubated.  11/26 TF @ 40 11/25 TF @ 30 11/23 TF started @ 20  Medications  reviewed and include: erythromycin, mag ox, MVI, 40 mEq KCl BID, metamucil BID, thiamine, vitamin B12  Diet Order:   Diet Order            Diet NPO time specified  Diet effective now              EDUCATION NEEDS:   Not appropriate for education at this time  Skin:  Skin Assessment: Skin Integrity Issues: Skin Integrity Issues:: DTI DTI: buttocks  Last BM:  175 ml 12/2  Height:   Ht Readings from Last 1 Encounters:  12/20/2018 _0  (1.803 m)    Weight:   Wt Readings from Last 1 Encounters:  12/27/18 (!) 140.5 kg    Ideal Body Weight:  78.1 kg  BMI:  Body mass index is 43.2 kg/m.  Estimated Nutritional Needs:   Kcal:  2200-2500  Protein:  120-140 grams  Fluid:  >/= 2 L  Maylon Peppers RD, LDN, CNSC [phone] Pager [phone] After Hours Pager

## 2018-12-27 NOTE — Progress Notes (Signed)
eLink Physician-Brief Progress Note Patient Name: Axl Rodino DOB: Aug 12, 1995 MRN: [id number]   Date of Service  12/27/2018  HPI/Events of Note  Unable to give Flomax per tube  eICU Interventions  Will hold off for now. Foley in place     Intervention Category Minor Interventions: Other:  Judd Lien 12/27/2018, 8:26 PM

## 2018-12-28 ENCOUNTER — Inpatient Hospital Stay (HOSPITAL_COMMUNITY)

## 2018-12-28 DIAGNOSIS — A419 Sepsis, unspecified organism: Secondary | ICD-10-CM | POA: Diagnosis not present

## 2018-12-28 DIAGNOSIS — R6520 Severe sepsis without septic shock: Secondary | ICD-10-CM | POA: Diagnosis not present

## 2018-12-28 LAB — CBC
HCT: 35.8 % — ABNORMAL LOW (ref 39.0–52.0)
Hemoglobin: 11.2 g/dL — ABNORMAL LOW (ref 13.0–17.0)
MCH: 28.1 pg (ref 26.0–34.0)
MCHC: 31.3 g/dL (ref 30.0–36.0)
MCV: 89.9 fL (ref 80.0–100.0)
Platelets: 854 10*3/uL — ABNORMAL HIGH (ref 150–400)
RBC: 3.98 MIL/uL — ABNORMAL LOW (ref 4.22–5.81)
RDW: 17.3 % — ABNORMAL HIGH (ref 11.5–15.5)
WBC: 15.7 10*3/uL — ABNORMAL HIGH (ref 4.0–10.5)
nRBC: 1.1 % — ABNORMAL HIGH (ref 0.0–0.2)

## 2018-12-28 LAB — GLUCOSE, CAPILLARY
Glucose-Capillary: 90 mg/dL (ref 70–99)
Glucose-Capillary: 87 mg/dL (ref 70–99)
Glucose-Capillary: 100 mg/dL — ABNORMAL HIGH (ref 70–99)
Glucose-Capillary: 104 mg/dL — ABNORMAL HIGH (ref 70–99)
Glucose-Capillary: 102 mg/dL — ABNORMAL HIGH (ref 70–99)
Glucose-Capillary: 101 mg/dL — ABNORMAL HIGH (ref 70–99)

## 2018-12-28 LAB — PHOSPHORUS
Phosphorus: 4 mg/dL (ref 2.5–4.6)
Phosphorus: 4.5 mg/dL (ref 2.5–4.6)

## 2018-12-28 IMAGING — DX DG CHEST 1V PORT
1 series · 1 of 1 positions shown · non-contrast
Comparison: [DATE]

CLINICAL DATA: Updated status.  Fever.

EXAM:
PORTABLE CHEST 1 VIEW

[chest]
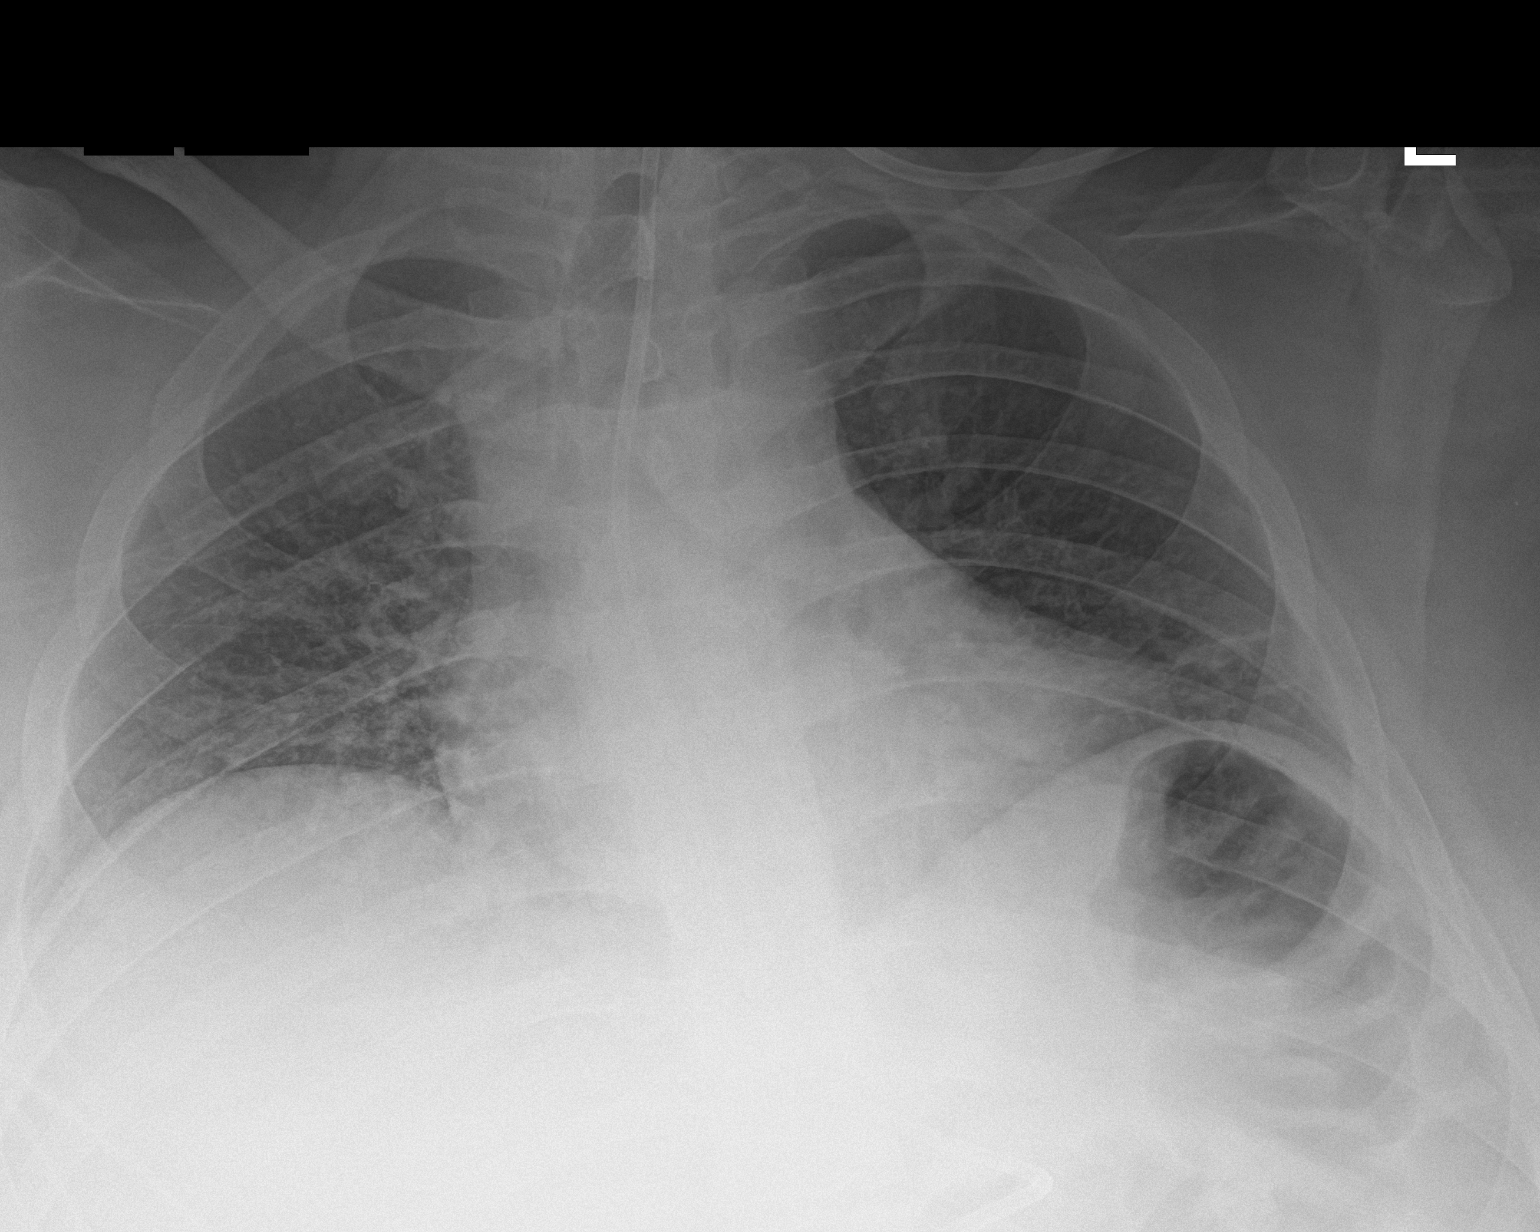

[1 of 1 positions shown; findings below may reference images not displayed]

FINDINGS: The feeding tube terminates below today's film. Mild opacity in left
base is somewhat streaky in platelike laterally. Low lung volumes.
No pneumothorax. The lungs are otherwise clear. The
cardiomediastinal silhouette is stable.
IMPRESSION: Streaky opacity in left base is favored to represent atelectasis
given the platelike configuration laterally. Developing infiltrate
not completely excluded. Recommend attention on follow-up.

## 2018-12-28 MED ORDER — SODIUM CHLORIDE 0.9 % IV BOLUS
500.0000 mL | Freq: Once | INTRAVENOUS | Status: AC
  Administered 2018-12-28: 500 mL via INTRAVENOUS

## 2018-12-28 MED ORDER — VITAMIN B-12 1000 MCG PO TABS
1000.0000 ug | ORAL_TABLET | Freq: Every day | ORAL | Status: DC
  Administered 2018-12-28 – 2019-01-09 (×11): 1000 ug
  Filled 2018-12-28 (×13): qty 1

## 2018-12-28 MED ORDER — FREE WATER
200.0000 mL | Freq: Three times a day (TID) | Status: DC
  Administered 2018-12-28 – 2018-12-30 (×6): 200 mL

## 2018-12-28 MED ORDER — LACTATED RINGERS IV BOLUS
500.0000 mL | Freq: Once | INTRAVENOUS | Status: AC
  Administered 2018-12-28: 500 mL via INTRAVENOUS

## 2018-12-28 NOTE — Progress Notes (Signed)
  Speech Language Pathology Treatment: Dysphagia;Cognitive-Linquistic  Patient Details Name: Fawaz Borquez MRN: [id number] DOB: [birth year] Today's Date: 12/28/2018 Time: 1761-6073 SLP Time Calculation (min) (ACUTE ONLY): 30 min  Assessment / Plan / Recommendation Clinical Impression  Jahzir was awake throughout integrated session with PT/OT to maximize upright position and participation. Slightly improved head control today and his motivation and interest in applesauce and ice chip continues. Forward head movement toward spoon and initial labial movement in attempts to receive spoon however no closure present. Applesauce remains on anterior tongue and therapist provided manual mandibular and labial closure around spoon. After a period of time, puree and puree were separately suctioned. BP taken and he became orthostatic and laid head of bed down where SLP continued to clean hard/soft palate and lips. Mom present during session. Continue NPO status with continued ST therapy concentration on oral manipulation and transit.     HPI HPI: Haji Delaine is a 23 y.o. male with medical history significant of autism (nonverbal), Tourettes, seizures, obesity, reflux esophagitis, hypertension constipation and sleep apnea. 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 Admitted for lethargy and vomiting. Found to have acute on chronic respiratory failure and intubated 11/27-11/30, ileus, HTN, tachycardia, aspiration, ascending weakness and encephalopathy. CXR No other acute cardiopulmonary abnormality. No other ST notes found.      SLP Plan  Continue with current plan of care       Recommendations  Diet recommendations: NPO Medication Administration: Via alternative means                Oral Care Recommendations: Oral care QID Follow up Recommendations: 24 hour supervision/assistance SLP  Visit Diagnosis: Dysphagia, unspecified (R13.10) Plan: Continue with current plan of care       GO                Houston Siren 12/28/2018, 10:52 AM  Orbie Pyo Colvin Caroli.Ed Risk analyst [phone] Office [phone]

## 2018-12-28 NOTE — Progress Notes (Signed)
Orthopedic Tech Progress Note Patient Details:  Aaron Vega 1995-10-22 [id number]  Ortho Devices Type of Ortho Device: Velcro wrist forearm splint Ortho Device/Splint Location: Bilateral Ortho Device/Splint Interventions: Application   Post Interventions Patient Tolerated: Well Instructions Provided: Care of device   [name] 12/28/2018, 11:59 AM

## 2018-12-28 NOTE — Progress Notes (Signed)
Physical Therapy Treatment Patient Details Name: Aaron Vega MRN: [id number] DOB: 1996-01-15 Today's Date: 12/28/2018    History of Present Illness 23 y.o. male with medical history significant of autism (nonverbal), Tourettes, seizures, obesity, reflux esophagitis, hypertension constipation and sleep apnea. 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 Admitted for lethargy and vomiting. Found to have acute on chronic respiratory failure and intubated 11/27-11/30, ileus, HTN, tachycardia, aspiration, ascending weakness and encephalopathy.    PT Comments    Pt required total +2 for bed mobility and sitting EOB to address poor to zero sitting balance. Pt with more engagement with tracking and head lifts today vs prior, but pt requires total assist to sit EOB for trunk control and bilateral LE guarding. Pt additionally limited by hypotension sitting EOB, did not recover with return to supine and reclining (see values below, RN and NP at bedside). Pt's mother at bedside during session, very supportive and engaged in session. PT to continue to follow acutely.   - BP(map) supine 125/70.  - BP 96/72(76) upon first sitting EOB - BPs with continued sitting EOB, even with SCDs donned: 71/33(42), 85/37(48).  - Pt returned to supine with the following readings: 67/49(54), 73/36(47), 90/39(53). RN and NP at bedside, NP states she will place order to ted hose, prevalon boots, resting hand splints, potentially abdominal binder   Follow Up Recommendations  SNF(Family refusing. Maximize HHPT if discharges home.)     Equipment Recommendations  Hospital bed;Other (comment)(hoyer lift)    Recommendations for Other Services       Precautions / Restrictions Precautions Precautions: Fall;Other (comment) Precaution Comments: cortrak, rectal tube Restrictions Weight Bearing Restrictions: Yes RLE  Weight Bearing: Non weight bearing LLE Weight Bearing: Non weight bearing    Mobility  Bed Mobility Overal bed mobility: Needs Assistance Bed Mobility: Supine to Sit;Sit to Supine;Rolling Rolling: Total assist;+2 for physical assistance   Supine to sit: Total assist;+2 for physical assistance Sit to supine: Total assist;+2 for physical assistance   General bed mobility comments: helicopter method utilized for transition to EOB. 0% pt contribution. Requires BIL LE blocked at EOB for safety. PT total (A) at EOB with no trunk activation, OT managing trunk and PT managing LEs and initiating RUE propping.  Transfers                 General transfer comment: unable to attempt  Ambulation/Gait                 Stairs             Wheelchair Mobility    Modified Rankin (Stroke Patients Only)       Balance Overall balance assessment: Needs assistance Sitting-balance support: Feet supported;No upper extremity supported Sitting balance-[name]: Zero Sitting balance - Comments: total assist for EOB sitting, sat ~15 minutes to attempt engaging in SLP feeding. Pt with head righting and visual tracking at EOB, OT supporting pt trunk and PT assisting pt in LE management and attempted UE propping at EOB R>L. Postural control: Posterior lean                                  Cognition Arousal/Alertness: Awake/alert Behavior During Therapy: Flat affect Overall Cognitive Status: Difficult to assess Area of Impairment: Following commands  Following Commands: Follows one step commands inconsistently       General Comments: nonverbal, lifts head to name call, tracking therapist walking into the room and lifting head with verbal cuing to look at PT, attempting to initiate bites of food with neck flexion      Exercises      General Comments General comments (skin integrity, edema, etc.): BP supine 125/70. Pt with BP  96/72(76) upon first sitting EOB. Pt with the following BPs with continued sitting EOB, even with SCDs donned: 71/33(42), 85/37(48). Pt returned to supine with the following readings: 67/49(54), 73/36(47), 90/39(53).      Pertinent Vitals/Pain Pain Assessment: Faces Faces Pain Scale: No hurt Pain Intervention(s): Limited activity within patient's tolerance;Monitored during session    Home Living                      Prior Function            PT Goals (current goals can now be found in the care plan section) Acute Rehab PT Goals Patient Stated Goal: home per family PT Goal Formulation: With family Time For Goal Achievement: 01/09/19 Potential to Achieve Goals: Poor Progress towards PT goals: Progressing toward goals    Frequency    Min 2X/week      PT Plan Current plan remains appropriate    Co-evaluation PT/OT/SLP Co-Evaluation/Treatment: Yes Reason for Co-Treatment: Complexity of the patient's impairments (multi-system involvement);Necessary to address cognition/behavior during functional activity;For patient/therapist safety;To address functional/ADL transfers PT goals addressed during session: Mobility/safety with mobility;Balance OT goals addressed during session: ADL's and self-care;Proper use of Adaptive equipment and DME;Strengthening/ROM      AM-PAC PT "6 Clicks" Mobility   Outcome Measure  Help needed turning from your back to your side while in a flat bed without using bedrails?: Total Help needed moving from lying on your back to sitting on the side of a flat bed without using bedrails?: Total Help needed moving to and from a bed to a chair (including a wheelchair)?: Total Help needed standing up from a chair using your arms (e.g., wheelchair or bedside chair)?: Total Help needed to walk in hospital room?: Total Help needed climbing 3-5 steps with a railing? : Total 6 Click Score: 6    End of Session   Activity Tolerance: Patient limited by  lethargy;Treatment limited secondary to medical complications (Comment)(hypotension) Patient left: in bed;with call bell/phone within reach;with family/visitor present;with nursing/sitter in room;with SCD's reapplied Nurse Communication: Mobility status;Other (comment)(orthostasis) PT Visit Diagnosis: Other abnormalities of gait and mobility (R26.89);Muscle weakness (generalized) (M62.81)     Time: 7829-5621 PT Time Calculation (min) (ACUTE ONLY): 36 min  Charges:  $Neuromuscular Re-education: 8-22 mins                     Kiera Hussey E, PT Acute Rehabilitation Services Pager [phone]  Office [phone]   Hazelgrace Bonham D [place] 12/28/2018, 12:22 PM

## 2018-12-28 NOTE — Progress Notes (Signed)
NAME:  Aaron Vega, MRN:  [id number], DOB:  1995/07/02, LOS: 20 ADMISSION DATE:  11/28/2018, CONSULTATION DATE:  12/10/2018 REFERRING MD:  Earnest Conroy - TRH, CHIEF COMPLAINT:  Rising lactate level.  Brief History   23 year old autistic man with abdominal distension and vomiting.  History of present illness   Known for poor and declining health status from severe autism with seizures, severe constipation recently.   His QoL was significantly better prior to 08/2018 when he suffered a severe seizure at home and struck his head. Seen in ED only but has has worsening mental status since. Per chart, this was ascribed to  hypoxic ischemic encephalopathy following prolonged seizure.   Presented via ED for increasing lethargy and vomiting and decreased urine output.   Significant disconnect regarding goals of care as currently receiving hospice care at home but still full code and mother not yet willing to place limits on care.  Was unable to have MRI performed 12/12/2018 secondary to agitation and risk of respiratory depression with sedation  Post lumbar puncture 12/14/2018  Seizure, aspirated stomach contents, intubated 11/27  Past Medical History  has Tics of organic origin; Autistic disorder; Intractable nausea and vomiting; Obesity (BMI 30-39.9); Seizure disorder ([place]); Dehydration; Vomiting in adult; Reflux esophagitis; Constipation in male; Constipation; Abnormal LFTs; Hyperglycemia; Obesity, Class III, BMI 40-49.9 (morbid obesity) ([place]); Hematemesis; Severe sepsis ([place]); Ileus ([place]); Aspiration pneumonia ([place]); Community acquired pneumonia of left lower lobe of lung; Goals of care, counseling/discussion; Palliative care by specialist; DNR (do not resuscitate) discussion; Protein-calorie malnutrition, severe; Weakness of lower extremity; Aspiration into airway; Pressure injury of skin; and Fever on their problem list.   Significant Hospital Events   11/14 admission to [facility]. 11/15  increasing lactate despite fluids. 11/16 transfer to intensive care unit 11/21transferred to medical floor 11/27 transferred back to ICU 11/27 intubated for sepsis, respiratory failure 11/30 Off pressors, extubated  Consults:  PCCM 11/16. Urology 12/10/2018 Neurology  Procedures:  10/16 NG tube placed per interventional radiology 11/15 Midline > 11/20 lumbar puncture 11/27 endotracheal intubation > 11/30 11/27 central line placement, right IJ > 12/1  Significant Diagnostic Tests:  11/14 > CT abdomen/pelvis: obese, small LLL infiltrate, dilated gas filled loops of small and large bowel to rectum, no transition. No gastric distension, moderate stool burden in ascending colon.  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  No seizures.  Micro Data:  SARS-Cov2 > negative Blood cultures 11/14  > negative Urine culture 11/14 > negative Urine culture 11/26 > negative Blood cultures 11/26 > negative  Antimicrobials:  Cefepime 11/14 >11/18 Flagyl 11/14 > 11/18 Unasyn 11/28  >   Interim history/subjective:  No acute events overnight, mom remains at bedside with full update given.   Objective   Blood pressure (!) 151/73, pulse (!) 114, temperature 99.2 F (37.3 C), temperature source Axillary, resp. rate (!) 30, height 5\' 11"  (1.803 m), weight (!) 140.8 kg, SpO2 100 %.        Intake/Output Summary (Last 24 hours) at 12/28/2018 1002 Last data filed at 12/28/2018 0800 Gross per 24 hour  Intake 1145 ml  Output 1675 ml  Net -530 ml   Filed Weights   12/26/18 0500 12/27/18 0425 12/28/18 0500  Weight: (!) 140.8 kg (!) 140.5 kg (!) 140.8 kg  Examination:  General: Adult  Male lying in bed, in NAD HEENT: [place]/AT, MM pink/moist, PERRL,  Neuro: Non  verbal at baseline, able to track in room CV: s1s2 regular rate and rhythm, no murmur, rubs, or gallops,  PULM:  Upper airway rhonchi cleared with NT suction, no increased work of breathing  GI: soft, bowel sounds active in all 4 quadrants, non-tender, non-distended Extremities: warm/dry, no edema  Skin: no rashes or lesions   Assessment & Plan:   Acute on chronic hypoxemic respiratory failure  -History of obstructive sleep apnea, intolerant of CPAP  -Extubated 11/30  P:  Wean supplemental oxygen as able  HOB at 30 degrees Ensure pulmonary hygiene Chest PT and PRN NT suction  Aspiration  P:  SLP continues to follow  Aspiration precautions NPO until cleared by speech Continue tube feeds Will likely need PEG tube prior to discharge   Hypertension, tachycardia  P:  Increased home lisinopril  Continue beta blocker therapy   Diarrhea  P:  Rectal tube Continue metamucil    Ascending weakness and encephalopathy  -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  -S/p 5 days IVIG and high-dose thiamine  P:  Continue Thiamine replacement  PT/OT  Possible seizure activity  -EEG's neg for epileptiform activity. Does have hx of underlying seizures.  P:  Continue Onfil and Keppra  Neuro following from a distance  Seizure precautions  Family is requesting to established care with neurologist in this area that has seen patient in the inpatient setting that can follow upon discharge     Ileus Hypokalemia -Improved on abdomen x-ray 11/30  P:  Continue erythromycin for gut mobility Continue tube feeds Replace electrolytes as needed   Goals of care  Mother has been updated at bedside daily. Mom made aware that there is a good chance that he may deteriorate again requiring reintubation as he has difficulty clearing secretions. If that were to happen again the chances of him recovering back to baseline are extremely slim    Pt was previously on hospice  Discussed DNR, DNI status  with mom and father over the telephone. They would like him to be intubated 1 more time if needed. They are not ready to make any decisions on care limitations at this point and are are not ready to meet palliative care  Mom updated again at bedside 12/4 with questions regarding progression and next steps.   Signature:   Delfin Gant, NP-C [facility] After hours pager: [phone]. 12/28/2018, 10:02 AM

## 2018-12-28 NOTE — Progress Notes (Signed)
PCCM progress note:  Asked to evaluate patient and speak with mom, she is concerned that patient will not receive adequate suctioning if not transferred to stepdown/progressive care.  He is awake and not in distress, however BP borderline tonight with HR 120-130 and temp 99.6. Rhonchi bilaterally on exam.  Will check CXR and transfer to progressive care, if he is worsening overnight will hold transfer out of the ICU.  Mom's questions answered and she agrees with this plan.

## 2018-12-28 NOTE — Progress Notes (Signed)
Occupational Therapy Treatment Patient Details Name: Aaron Vega MRN: [id number] DOB: July 06, 1995 Today's Date: 12/28/2018    History of present illness 23 y.o. male with medical history significant of autism (nonverbal), Tourettes, seizures, obesity, reflux esophagitis, hypertension constipation and sleep apnea. 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 Admitted for lethargy and vomiting. Found to have acute on chronic respiratory failure and intubated 11/27-11/30, ileus, HTN, tachycardia, aspiration, ascending weakness and encephalopathy.   OT comments  Pt shows initiation for eating from SLP but poor lip closure and no attempts to swallow. Pt requires suction for any intake offers. Pt total (A) at EOB with decrease BP with Map 50 noted. RN called to room due to poor return with bed tilted. Pt moving R UE more this session and increased head movement to verbal cues.    Follow Up Recommendations       Equipment Recommendations  Hospital bed;Other (comment)    Recommendations for Other Services Other (comment)    Precautions / Restrictions Precautions Precautions: Fall;Other (comment) Precaution Comments: cortrak, rectal tube Restrictions Weight Bearing Restrictions: Yes RLE Weight Bearing: Non weight bearing LLE Weight Bearing: Non weight bearing       Mobility Bed Mobility Overal bed mobility: Needs Assistance Bed Mobility: Supine to Sit;Sit to Supine;Rolling Rolling: Total assist;+2 for physical assistance   Supine to sit: Total assist;+2 for physical assistance Sit to supine: Total assist;+2 for physical assistance   General bed mobility comments: helicopter method utilized for transition to EOB. No active participation from pt. Requires BIL LE blocked at EOB for safety. PT total (A) at EOB with no trunk activation  Transfers                 General  transfer comment: unable to attempt    Balance Overall balance assessment: Needs assistance Sitting-balance support: Feet supported;No upper extremity supported Sitting balance-[name]: Zero                                     ADL either performed or assessed with clinical judgement   ADL Overall ADL's : Needs assistance/impaired Eating/Feeding: NPO   Grooming: Total assistance   Upper Body Bathing: Total assistance                             General ADL Comments: total (A) for all care at time. pt with spoon placed in R UE. pt does so initiation. Pt attempts to push arm away toward therapist to hand spoon on command      Vision       Perception     Praxis      Cognition Arousal/Alertness: Awake/alert Behavior During Therapy: Flat affect Overall Cognitive Status: Difficult to assess Area of Impairment: Following commands                       Following Commands: Follows one step commands inconsistently       General Comments: nonverbal, lifts head to name call, tracking therapist walking into the room, attempting to initiate bites of food with neck flexion        Exercises     Shoulder Instructions       General Comments pt with decr BP with Map 50 with SCD ( see  vitals from BP) pt able to still able to move head in response with decrease BP     Pertinent Vitals/ Pain       Pain Assessment: No/denies pain Faces Pain Scale: Hurts a little bit Pain Intervention(s): Monitored during session  Home Living                                          Prior Functioning/Environment              Frequency  Min 2X/week        Progress Toward Goals  OT Goals(current goals can now be found in the care plan section)  Progress towards OT goals: Progressing toward goals  Acute Rehab OT Goals Patient Stated Goal: home per family OT Goal Formulation: With family Time For Goal Achievement:  01/09/19 Potential to Achieve Goals: Good ADL Goals Pt Will Perform Eating: with max assist;bed level Pt Will Perform Grooming: with max assist;bed level Pt Will Transfer to Toilet: with transfer board;bedside commode;with +2 assist;with max assist Pt/caregiver will Perform Home Exercise Program: Increased strength;Both right and left upper extremity;With written HEP provided Additional ADL Goal #1: pt will initate log roll with head rotation Additional ADL Goal #2: pt will demonstrate UE activation for adl task with any movement toward object  Plan Discharge plan remains appropriate    Co-evaluation    PT/OT/SLP Co-Evaluation/Treatment: Yes Reason for Co-Treatment: Complexity of the patient's impairments (multi-system involvement);Necessary to address cognition/behavior during functional activity;For patient/therapist safety;To address functional/ADL transfers   OT goals addressed during session: ADL's and self-care;Proper use of [place] equipment and DME;Strengthening/ROM      AM-PAC OT "6 Clicks" Daily Activity     Outcome Measure   Help from another person eating meals?: Total Help from another person taking care of personal grooming?: Total Help from another person toileting, which includes using toliet, bedpan, or urinal?: Total Help from another person bathing (including washing, rinsing, drying)?: Total Help from another person to put on and taking off regular upper body clothing?: Total Help from another person to put on and taking off regular lower body clothing?: Total 6 Click Score: 6    End of Session    OT Visit Diagnosis: Unsteadiness on feet (R26.81);Muscle weakness (generalized) (M62.81);Adult, failure to thrive (R62.7);History of falling (Z91.81)   Activity Tolerance Patient tolerated treatment well   Patient Left in bed;with call bell/phone within reach;with family/visitor present;with bed alarm set;with nursing/sitter in room   Nurse Communication  Mobility status;Precautions;Need for lift equipment;Other (comment)(need for ted hose and SCD)        Time: 1443-1540 OT Time Calculation (min): 37 min  Charges: OT General Charges $OT Visit: 1 Visit OT Treatments $Neuromuscular Re-education: 8-22 mins   Brynn, OTR/L  Acute Rehabilitation Services Pager: [phone] Office: [phone] .    [name] 12/28/2018, 11:54 AM

## 2018-12-28 NOTE — Progress Notes (Signed)
Chest pt held for this round. RT started chest pt and patients HR got up in the 140's. Treatment was stopped and HR is back around 110. RT will try next rounds.

## 2018-12-28 NOTE — Progress Notes (Signed)
Was requested to speak with patient's mom regarding outpatient neurology follow-up.  I spoke with patient's mother at bedside and father on phone.  On review of patient's records, patient has been following [place] neurology.  Given his complicated epilepsy case, recommend continuing follow-up at [facility] neurology.  I did provide family with contact information of local neurology clinics including [place] neurology and [facility] neurology.  Patient's parents agreed that given he has been at [facility] neurology long-term, it might be best if they continue his care at [facility] neurology.  They requested that I communicate with his neurologist Dr. Opal Sidles at [facility] and share his neurology records with him so he is updated about his current admission.  I will try to contact Dr. Opal Sidles and shared the records.  Keelyn Monjaras Barbra Sarks

## 2018-12-28 NOTE — Progress Notes (Signed)
[place] Progress Note Patient Name: Aaron Vega DOB: [birth year] MRN: [id number]   Date of Service  12/28/2018  HPI/Events of Note  Called due to mom's concern regarding transfer to med surg. Patient requiring frequent suctioning. On assessment HR 120s.  eICU Interventions   Bedside RN to inquire if frequent suctioning can be done on floor  Bedside team to assess     Intervention Category Major Interventions: Other:  Judd Lien 12/28/2018, 9:14 PM

## 2018-12-29 LAB — GLUCOSE, CAPILLARY
Glucose-Capillary: 99 mg/dL (ref 70–99)
Glucose-Capillary: 100 mg/dL — ABNORMAL HIGH (ref 70–99)
Glucose-Capillary: 101 mg/dL — ABNORMAL HIGH (ref 70–99)
Glucose-Capillary: 97 mg/dL (ref 70–99)
Glucose-Capillary: 99 mg/dL (ref 70–99)
Glucose-Capillary: 114 mg/dL — ABNORMAL HIGH (ref 70–99)

## 2018-12-29 LAB — CBC
HCT: 34.2 % — ABNORMAL LOW (ref 39.0–52.0)
Hemoglobin: 11 g/dL — ABNORMAL LOW (ref 13.0–17.0)
MCH: 28.2 pg (ref 26.0–34.0)
MCHC: 32.2 g/dL (ref 30.0–36.0)
MCV: 87.7 fL (ref 80.0–100.0)
Platelets: 897 10*3/uL — ABNORMAL HIGH (ref 150–400)
RBC: 3.9 MIL/uL — ABNORMAL LOW (ref 4.22–5.81)
RDW: 17.7 % — ABNORMAL HIGH (ref 11.5–15.5)
WBC: 13 10*3/uL — ABNORMAL HIGH (ref 4.0–10.5)
nRBC: 0.2 % (ref 0.0–0.2)

## 2018-12-29 LAB — BASIC METABOLIC PANEL
Anion gap: 12 (ref 5–15)
BUN: 13 mg/dL (ref 6–20)
CO2: 26 mmol/L (ref 22–32)
Calcium: 9.4 mg/dL (ref 8.9–10.3)
Chloride: 96 mmol/L — ABNORMAL LOW (ref 98–111)
Creatinine, Ser: 0.34 mg/dL — ABNORMAL LOW (ref 0.61–1.24)
GFR calc Af Amer: >60 mL/min (ref >60)
GFR calc non Af Amer: >60 mL/min (ref >60)
Glucose, Bld: 98 mg/dL (ref 70–99)
Potassium: 5 mmol/L (ref 3.5–5.1)
Sodium: 134 mmol/L — ABNORMAL LOW (ref 135–145)

## 2018-12-29 LAB — PHOSPHORUS: Phosphorus: 5.8 mg/dL — ABNORMAL HIGH (ref 2.5–4.6)

## 2018-12-29 NOTE — Progress Notes (Signed)
Palliative:    Chart reviewed.  Goals are set for Full scope/code.  Mother is agreeable to PEG tube placement with goals of returning to home when stable.    Plan: Goals are set.  Full code/full scope.  Mother agreeable to PEG placement and return to home.  PMT to continue to provide support to patient, family, staff during this difficult time.  No charge Quinn Axe, NP Palliative Medicine Team Team Phone # [phone] Greater than 50% of this time was spent counseling and coordinating care related to the above assessment and plan

## 2018-12-29 NOTE — Progress Notes (Signed)
Consulted and asked to assume care of this unfortunate 23 year old gentleman with history of severe autism and significant functional decline who has had a significant seizure disorder and has been under the care of pulmonary critical care medicine.  Patient has had problem with oral intake and refusing to eat.  He is failing to thrive.  Family however is still expecting full care round-the-clock.  He usually follows up with neurologist at [facility].  Since August of this year he has declined significantly.  He has severe constipation with the seizures.  Mental status continued to worsen.  He has been receiving hospice care at home although he is a full code.  He had lumbar puncture recently with no significant findings.  Patient will require a PEG tube and hopefully transition to home care with home health.  We will assume care from the morning.

## 2018-12-29 NOTE — Progress Notes (Signed)
PROGRESS NOTE    Aaron Vega  CBU:[id number] DOB: 1995/05/18 DOA: 12/24/2018 PCP: [facility]     Brief Narrative:  Aaron Vega is a 23 yo male with past medical history significant for severe autism with seizures, nonverbal at baseline, who presented with declining health.  He suffered a severe seizure at home and struck his head in August 2020 and has been worsening in mentation since that time.  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  He presented to the emergency department for increasing lethargy, vomiting and decreased urine output.  He had been previously enrolled in hospice at home, but mother has now revoked hospice.   11/14 Admission to [facility] for acute metabolic encephalopathy, severe sepsis due to aspiration pneumonia, ileus  11/16 Transfer to ICU due to respiratory distress, worsening lactic acidosis  11/20 LP  11/21 Transferred to [facility]  11/27 Transferred back to ICU due to aspiration event and requiring intubation  11/30 Off pressors, extubated 12/5 Transferred to progressive unit, TRH service   New events last 24 hours / Subjective: No acute events overnight. Transfer to progressive unit today. Mom at bedside, very concerned regarding deep suction requirements. Discussed with her PEG placement for long term nutrition which she is in agreement with. Her plan is to take patient home when patient ready to discharge from hospital.   Assessment & Plan:   Principal Problem:   Severe sepsis ([place]) Active Problems:   Autistic disorder   Intractable nausea and vomiting   Seizure disorder (HCC)   Constipation in male   Obesity, Class III, BMI 40-49.9 (morbid obesity) ([place])   Ileus (HCC)   Aspiration pneumonia ([place])   Community acquired pneumonia of left lower lobe of lung   Goals of care, counseling/discussion   Palliative care by specialist   DNR (do not resuscitate) discussion  Protein-calorie malnutrition, severe   Weakness of lower extremity   Aspiration into airway   Pressure injury of skin   Fever    Acute on chronic respiratory failure -Obstructive sleep apnea history, not tolerant of positive pressure ventilation -Now extubated -Currently on 2L [place] O2  -Continue deep suction prn, chest PT  Severe sepsis secondary to aspiration pneumonia -S/p Cefepime, flagyl 11/14-11/18; Unasyn 11/28-12/2  -Blood culture 11/14 negative -Blood culture 11/26 negative  -Continue aspiration precautions -SLP following, NPO for now -PEG placement; consulted IR   Acute metabolic encephalopathy, cause unclear likely multifactorial 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 Vit B12 159. Continue thiamine, B12, MV  -Continue Keppra, Onfi -Neurology signed off. Patient may follow up with his Neurologist at [facility] Dr. Opal Sidles. 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  -Completed 5 days of IVIG and high-dose thiamine replacement  -Continue thiamine  -PT OT   Ileus -Continue erythromycin -Abdominal xray negative 12/1  -Improved   Hypothyroidism -Continue synthroid   HTN -Continue lisinopril, metoprolol     DVT prophylaxis: Lovenox  Code Status: Full code Family Communication: Mother at bedside Disposition Plan: Continue tube feedings for now, PEG tube placement    Consultants:   PCCM  Neurology  Palliative care   Antimicrobials:  Anti-infectives (From admission, onward)   Start     Dose/Rate Route Frequency Ordered Stop   12/22/18 0900  ampicillin-sulbactam (  UNASYN) 1.5 g in sodium chloride 0.9 % 100 mL IVPB  Status:  Discontinued     1.5 g 200 mL/hr over 30 Minutes Intravenous Every 6 hours 12/22/18 0851 12/22/18 0857    12/22/18 0900  Ampicillin-Sulbactam (UNASYN) 3 g in sodium chloride 0.9 % 100 mL IVPB     3 g 200 mL/hr over 30 Minutes Intravenous Every 6 hours 12/22/18 0857 12/26/18 2105   12/19/18 1400  erythromycin (EES) 400 MG/5ML suspension 500 mg     500 mg Per Tube Every 8 hours 12/19/18 1132     12/18/18 1500  erythromycin 500 mg in sodium chloride 0.9 % 100 mL IVPB  Status:  Discontinued     500 mg 100 mL/hr over 60 Minutes Intravenous Every 8 hours 12/18/18 1449 12/19/18 1132   12/15/18 1400  erythromycin (EES) 400 MG/5ML suspension 400 mg  Status:  Discontinued     400 mg Per Tube Every 8 hours 12/15/18 1012 12/15/18 1130   12/15/18 1400  erythromycin 500 mg in sodium chloride 0.9 % 100 mL IVPB  Status:  Discontinued     500 mg 100 mL/hr over 60 Minutes Intravenous Every 8 hours 12/15/18 1130 12/18/18 1449   12/09/18 0600  vancomycin (VANCOCIN) 1,250 mg in sodium chloride 0.9 % 250 mL IVPB  Status:  Discontinued     1,250 mg 166.7 mL/hr over 90 Minutes Intravenous Every 12 hours 12/02/2018 1539 12/10/18 1043   12/09/18 0000  ceFEPIme (MAXIPIME) 2 g in sodium chloride 0.9 % 100 mL IVPB  Status:  Discontinued     2 g 200 mL/hr over 30 Minutes Intravenous Every 8 hours 12/18/2018 1539 12/11/18 1801   12/09/18 0000  metroNIDAZOLE (FLAGYL) IVPB 500 mg  Status:  Discontinued     500 mg 100 mL/hr over 60 Minutes Intravenous Every 8 hours 12/20/2018 2146 12/11/18 1801   12/21/2018 1415  ceFEPIme (MAXIPIME) 2 g in sodium chloride 0.9 % 100 mL IVPB     2 g 200 mL/hr over 30 Minutes Intravenous  Once 11/30/2018 1411 11/27/2018 1538   12/04/2018 1415  metroNIDAZOLE (FLAGYL) IVPB 500 mg     500 mg 100 mL/hr over 60 Minutes Intravenous  Once 11/27/2018 1411 12/22/2018 1609   12/16/2018 1415  vancomycin (VANCOCIN) IVPB 1000 mg/200 mL premix  Status:  Discontinued     1,000 mg 200 mL/hr over 60 Minutes Intravenous  Once 11/28/2018 1411 12/10/2018 1414   11/26/2018 1415  vancomycin (VANCOCIN) 2,500 mg in sodium chloride 0.9 %  500 mL IVPB     2,500 mg 250 mL/hr over 120 Minutes Intravenous  Once 12/04/2018 1414 12/23/2018 1710       Objective: Vitals:   12/29/18 0600 12/29/18 0700 12/29/18 0800 12/29/18 1021  BP: (!) 107/41 121/67 122/74 (!) 121/54  Pulse: (!) 129 (!) 127 (!) 131 (!) 136  Resp: (!) 26 (!) 31 (!) 21   Temp:   99.2 F (37.3 C)   TempSrc:   Axillary   SpO2: 99% 98% 99%   Weight:      Height:        Intake/Output Summary (Last 24 hours) at 12/29/2018 1029 Last data filed at 12/29/2018 0600 Gross per 24 hour  Intake 2100 ml  Output 2650 ml  Net -550 ml   Filed Weights   12/27/18 0425 12/28/18 0500 12/29/18 0500  Weight: (!) 140.5 kg (!) 140.8 kg (!) 138.6 kg    Examination: General exam: Appears calm, chronically ill appearing  Respiratory  system: Clear to auscultation anteriorly  Cardiovascular system: S1 & S2 heard, tachycardic regular rhythm rate 130s Gastrointestinal system: Abdomen is nondistended, soft  Central nervous system: Alert. Nonverbal at baseline.  Extremities: Symmetric in appearance bilaterally    Data Reviewed: I have personally reviewed following labs and imaging studies  CBC: Recent Labs  Lab 12/24/18 0500 12/25/18 0407 12/26/18 0100 12/26/18 0748 12/27/18 0843 12/28/18 0620  WBC 5.1 6.4  --  8.7 12.6* 15.7*  HGB 6.7* 9.1* 9.5* 10.0* 10.6* 11.2*  HCT 22.5* 27.8* 28.0* 30.3* 33.9* 35.8*  MCV 91.5 85.5  --  85.4 89.0 89.9  PLT 395 493*  --  620* 787* 811*   Basic Metabolic Panel: Recent Labs  Lab 12/25/18 0407 12/26/18 0100 12/26/18 0748  12/27/18 0843 12/27/18 1645 12/28/18 0620 12/28/18 1809 12/29/18 0541  NA 141 141  --   --  140  --   --   --  134*  K 3.1* 3.2*  --   --  4.2  --   --   --  5.0  CL 106  --   --   --  104  --   --   --  96*  CO2 26  --   --   --  28  --   --   --  26  GLUCOSE 89  --   --   --  90  --   --   --  98  BUN 5*  --   --   --  9  --   --   --  13  CREATININE <0.30*  --   --   --  0.34*  --   --   --  0.34*   CALCIUM 8.6*  --   --   --  9.0  --   --   --  9.4  MG 1.5*  --  1.7  --   --   --   --   --   --   PHOS 3.4  --  3.4   < > 3.1 3.3 4.0 4.5 5.8*   < > = values in this interval not displayed.   GFR: Estimated Creatinine Clearance: 207.8 mL/min (A) (by C-G formula based on SCr of 0.34 mg/dL (L)). Liver Function Tests: No results for input(s): AST, ALT, ALKPHOS, BILITOT, PROT, ALBUMIN in the last 168 hours. No results for input(s): LIPASE, AMYLASE in the last 168 hours. No results for input(s): AMMONIA in the last 168 hours. Coagulation Profile: No results for input(s): INR, PROTIME in the last 168 hours. Cardiac Enzymes: No results for input(s): CKTOTAL, CKMB, CKMBINDEX, TROPONINI in the last 168 hours. BNP (last 3 results) No results for input(s): PROBNP in the last 8760 hours. HbA1C: No results for input(s): HGBA1C in the last 72 hours. CBG: Recent Labs  Lab 12/28/18 1556 12/28/18 1928 12/28/18 2317 12/29/18 0319 12/29/18 0822  GLUCAP 104* 102* 101* 99 100*   Lipid Profile: No results for input(s): CHOL, HDL, LDLCALC, TRIG, CHOLHDL, LDLDIRECT in the last 72 hours. Thyroid Function Tests: No results for input(s): TSH, T4TOTAL, FREET4, T3FREE, THYROIDAB in the last 72 hours. Anemia Panel: No results for input(s): VITAMINB12, FOLATE, FERRITIN, TIBC, IRON, RETICCTPCT in the last 72 hours. Sepsis Labs: Recent Labs  Lab 12/23/18 0445  PROCALCITON 0.76    Recent Results (from the past 240 hour(s))  Culture, blood (routine x 2)     Status: None   Collection Time: 12/20/18  8:18  AM   Specimen: BLOOD  Result Value Ref Range Status   Specimen Description BLOOD RIGHT ANTECUBITAL  Final   Special Requests   Final    BOTTLES DRAWN AEROBIC ONLY Blood Culture adequate volume   Culture   Final    NO GROWTH 5 DAYS Performed at [facility], [address], [place], [place] [address]    Report Status 12/25/2018 FINAL  Final  Culture, blood (routine x 2)     Status: None    Collection Time: 12/20/18  8:26 AM   Specimen: BLOOD LEFT HAND  Result Value Ref Range Status   Specimen Description BLOOD LEFT HAND  Final   Special Requests   Final    BOTTLES DRAWN AEROBIC ONLY Blood Culture results may not be optimal due to an inadequate volume of blood received in culture bottles   Culture   Final    NO GROWTH 5 DAYS Performed at [facility], [place] [address] [place], [place] [address]    Report Status 12/25/2018 FINAL  Final  Culture, Urine     Status: None   Collection Time: 12/20/18  7:59 PM   Specimen: Urine, Clean Catch  Result Value Ref Range Status   Specimen Description URINE, CLEAN CATCH  Final   Special Requests NONE  Final   Culture   Final    NO GROWTH Performed at [facility], [place] [address], [place],  [address]    Report Status 12/23/2018 FINAL  Final      Radiology Studies: Dg Chest Port 1 View  Result Date: 12/28/2018 CLINICAL DATA:  Updated status.  Fever. EXAM: PORTABLE CHEST 1 VIEW COMPARISON:  December 26, 2018 FINDINGS: The feeding tube terminates below today's film. Mild opacity in left base is somewhat streaky in platelike laterally. Low lung volumes. No pneumothorax. The lungs are otherwise clear. The cardiomediastinal silhouette is stable. IMPRESSION: Streaky opacity in left base is favored to represent atelectasis given the platelike configuration laterally. Developing infiltrate not completely excluded. Recommend attention on follow-up. Electronically Signed   By: Dorise Bullion III M.D   On: 12/28/2018 21:44      Scheduled Meds: . sodium chloride   Intravenous Once  . chlorhexidine gluconate (MEDLINE KIT)  15 mL Mouth Rinse BID  . Chlorhexidine Gluconate Cloth  6 each Topical Q0600  . cloBAZam  60 mg Per Tube BID  . enoxaparin (LOVENOX) injection  40 mg Subcutaneous Q12H  . erythromycin  500 mg Per Tube Q8H  . feeding supplement (PRO-STAT SUGAR FREE 64)  60 mL Per Tube BID  . free water  200 mL Per Tube Q8H   . levothyroxine  25 mcg Per Tube Q0600  . lidocaine  6 mL Mouth/Throat Once  . lisinopril  40 mg Per NG tube Daily  . magnesium oxide  400 mg Per NG tube BID  . mouth rinse  15 mL Mouth Rinse q12n4p  . metoprolol tartrate  125 mg Per NG tube BID  . multivitamin  15 mL Per Tube Daily  . nystatin   Topical Daily  . pantoprazole sodium  40 mg Per Tube BID  . potassium chloride  40 mEq Per Tube Daily  . psyllium  1 packet Per Tube BID  . sodium chloride flush  10-40 mL Intracatheter Q12H  . sodium chloride flush  10-40 mL Intracatheter Q12H  . tamsulosin  0.4 mg Oral Daily  . thiamine injection  100 mg Intravenous Daily  . vitamin  B-12  1,000 mcg Per Tube Daily   Continuous Infusions: . feeding supplement (VITAL 1.5 CAL) 1,000 mL (12/29/18 0047)  . levETIRAcetam 1,000 mg (12/29/18 1027)     LOS: 21 days      Time spent: 40 minutes   Dessa Phi, DO [facility] 12/29/2018, 10:29 AM   Available via Epic secure chat 7am-7pm After these hours, please refer to coverage provider listed on [email or web address]

## 2018-12-29 NOTE — Progress Notes (Signed)
CPT held at this time, patient HR increases w/ CPT. No further complications noted

## 2018-12-30 ENCOUNTER — Inpatient Hospital Stay (HOSPITAL_COMMUNITY)

## 2018-12-30 DIAGNOSIS — R6520 Severe sepsis without septic shock: Secondary | ICD-10-CM | POA: Diagnosis not present

## 2018-12-30 DIAGNOSIS — A419 Sepsis, unspecified organism: Secondary | ICD-10-CM | POA: Diagnosis not present

## 2018-12-30 DIAGNOSIS — R197 Diarrhea, unspecified: Secondary | ICD-10-CM

## 2018-12-30 LAB — C DIFFICILE QUICK SCREEN W PCR REFLEX
C Diff antigen: NEGATIVE
C Diff interpretation: NOT DETECTED
C Diff toxin: NEGATIVE

## 2018-12-30 LAB — CBC
HCT: 35.6 % — ABNORMAL LOW (ref 39.0–52.0)
Hemoglobin: 11.2 g/dL — ABNORMAL LOW (ref 13.0–17.0)
MCH: 28.4 pg (ref 26.0–34.0)
MCHC: 31.5 g/dL (ref 30.0–36.0)
MCV: 90.4 fL (ref 80.0–100.0)
Platelets: 947 10*3/uL (ref 150–400)
RBC: 3.94 MIL/uL — ABNORMAL LOW (ref 4.22–5.81)
RDW: 18.2 % — ABNORMAL HIGH (ref 11.5–15.5)
WBC: 17.4 10*3/uL — ABNORMAL HIGH (ref 4.0–10.5)
nRBC: 0 % (ref 0.0–0.2)

## 2018-12-30 LAB — GLUCOSE, CAPILLARY
Glucose-Capillary: 101 mg/dL — ABNORMAL HIGH (ref 70–99)
Glucose-Capillary: 103 mg/dL — ABNORMAL HIGH (ref 70–99)
Glucose-Capillary: 103 mg/dL — ABNORMAL HIGH (ref 70–99)
Glucose-Capillary: 109 mg/dL — ABNORMAL HIGH (ref 70–99)
Glucose-Capillary: 111 mg/dL — ABNORMAL HIGH (ref 70–99)
Glucose-Capillary: 121 mg/dL — ABNORMAL HIGH (ref 70–99)

## 2018-12-30 LAB — BLOOD GAS, ARTERIAL
Acid-Base Excess: 4.1 mmol/L — ABNORMAL HIGH (ref 0.0–2.0)
Bicarbonate: 27.7 mmol/L (ref 20.0–28.0)
FIO2: 100
O2 Saturation: 98.5 %
Patient temperature: 38.9
pCO2 arterial: 41.8 mmHg (ref 32.0–48.0)
pH, Arterial: 7.446 (ref 7.350–7.450)
pO2, Arterial: 114 mmHg — ABNORMAL HIGH (ref 83.0–108.0)

## 2018-12-30 LAB — BASIC METABOLIC PANEL
Anion gap: 13 (ref 5–15)
BUN: 21 mg/dL — ABNORMAL HIGH (ref 6–20)
CO2: 26 mmol/L (ref 22–32)
Calcium: 9.3 mg/dL (ref 8.9–10.3)
Chloride: 97 mmol/L — ABNORMAL LOW (ref 98–111)
Creatinine, Ser: 0.49 mg/dL — ABNORMAL LOW (ref 0.61–1.24)
GFR calc Af Amer: >60 mL/min (ref >60)
GFR calc non Af Amer: >60 mL/min (ref >60)
Glucose, Bld: 113 mg/dL — ABNORMAL HIGH (ref 70–99)
Potassium: 4.5 mmol/L (ref 3.5–5.1)
Sodium: 136 mmol/L (ref 135–145)

## 2018-12-30 IMAGING — DX DG ABD PORTABLE 1V
2 series · 2 of 2 positions shown · non-contrast
Comparison: Portable exam [GN] hours compared to [GN] hours

CLINICAL DATA: Ileus

EXAM:
PORTABLE ABDOMEN - 1 VIEW

[abdomen kub (1 of 2)]
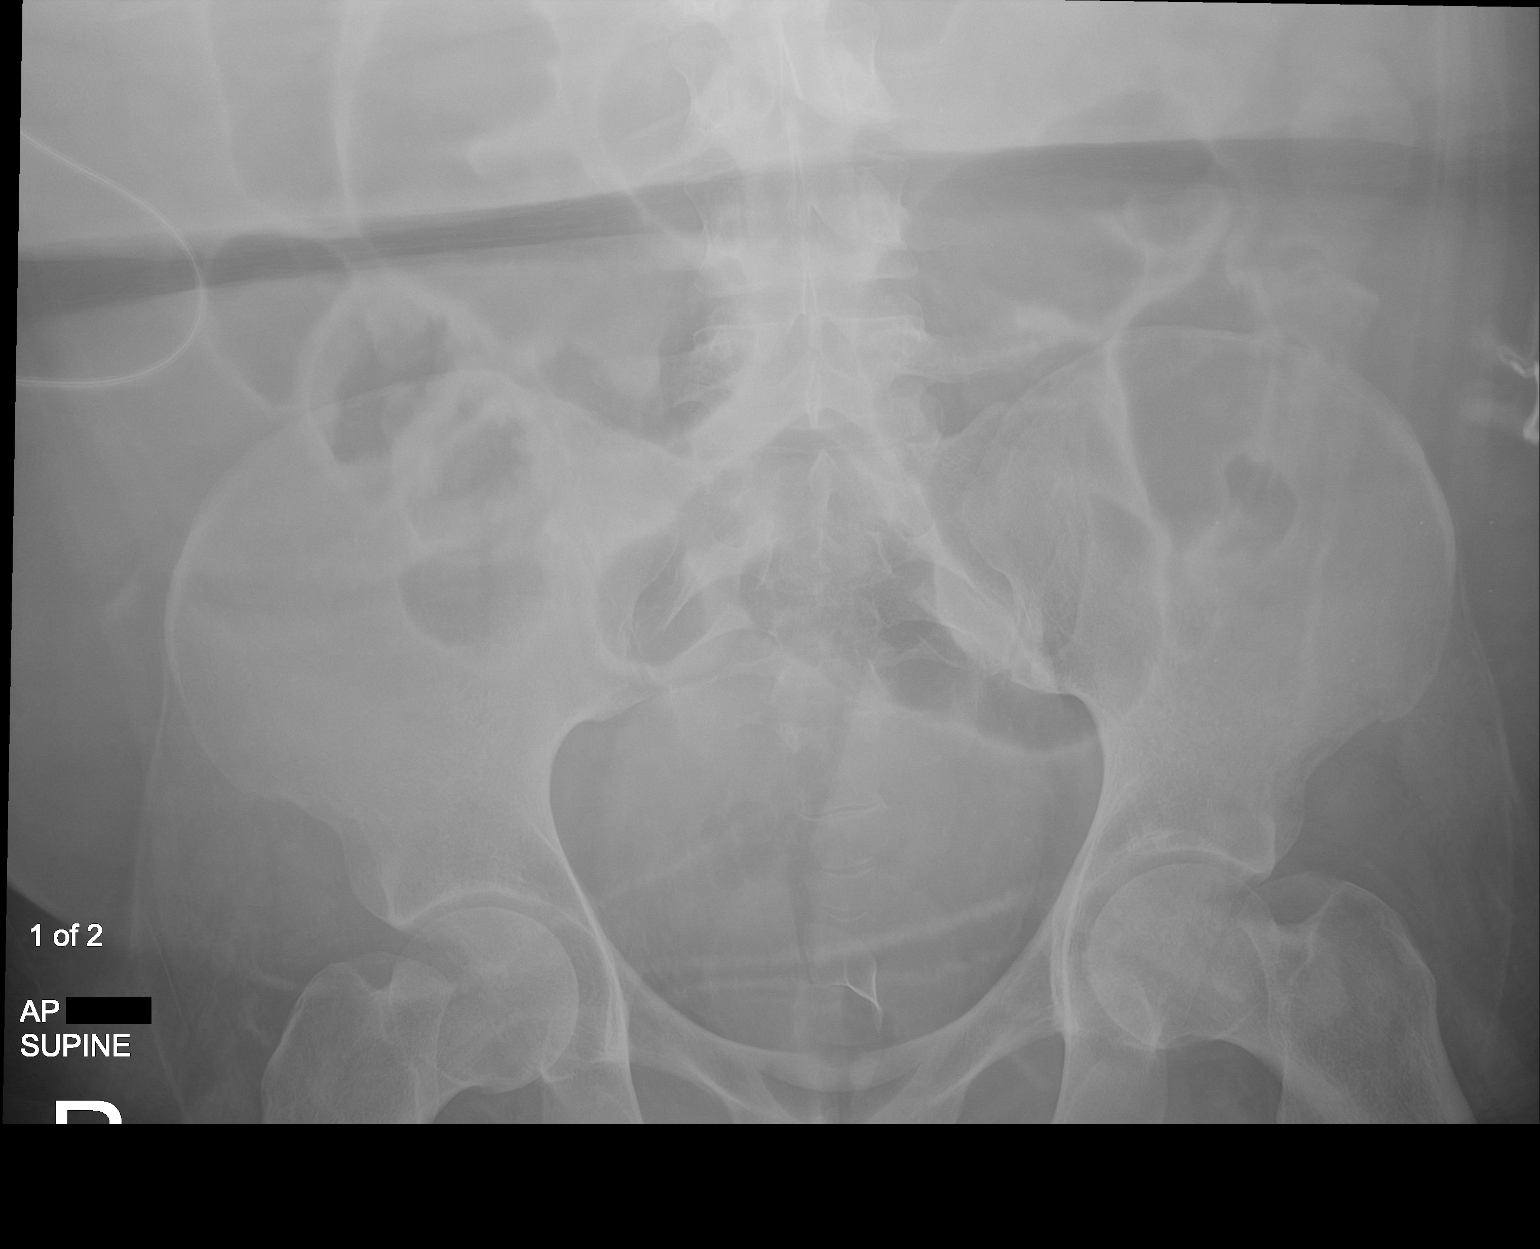

[abdomen kub (2 of 2)]
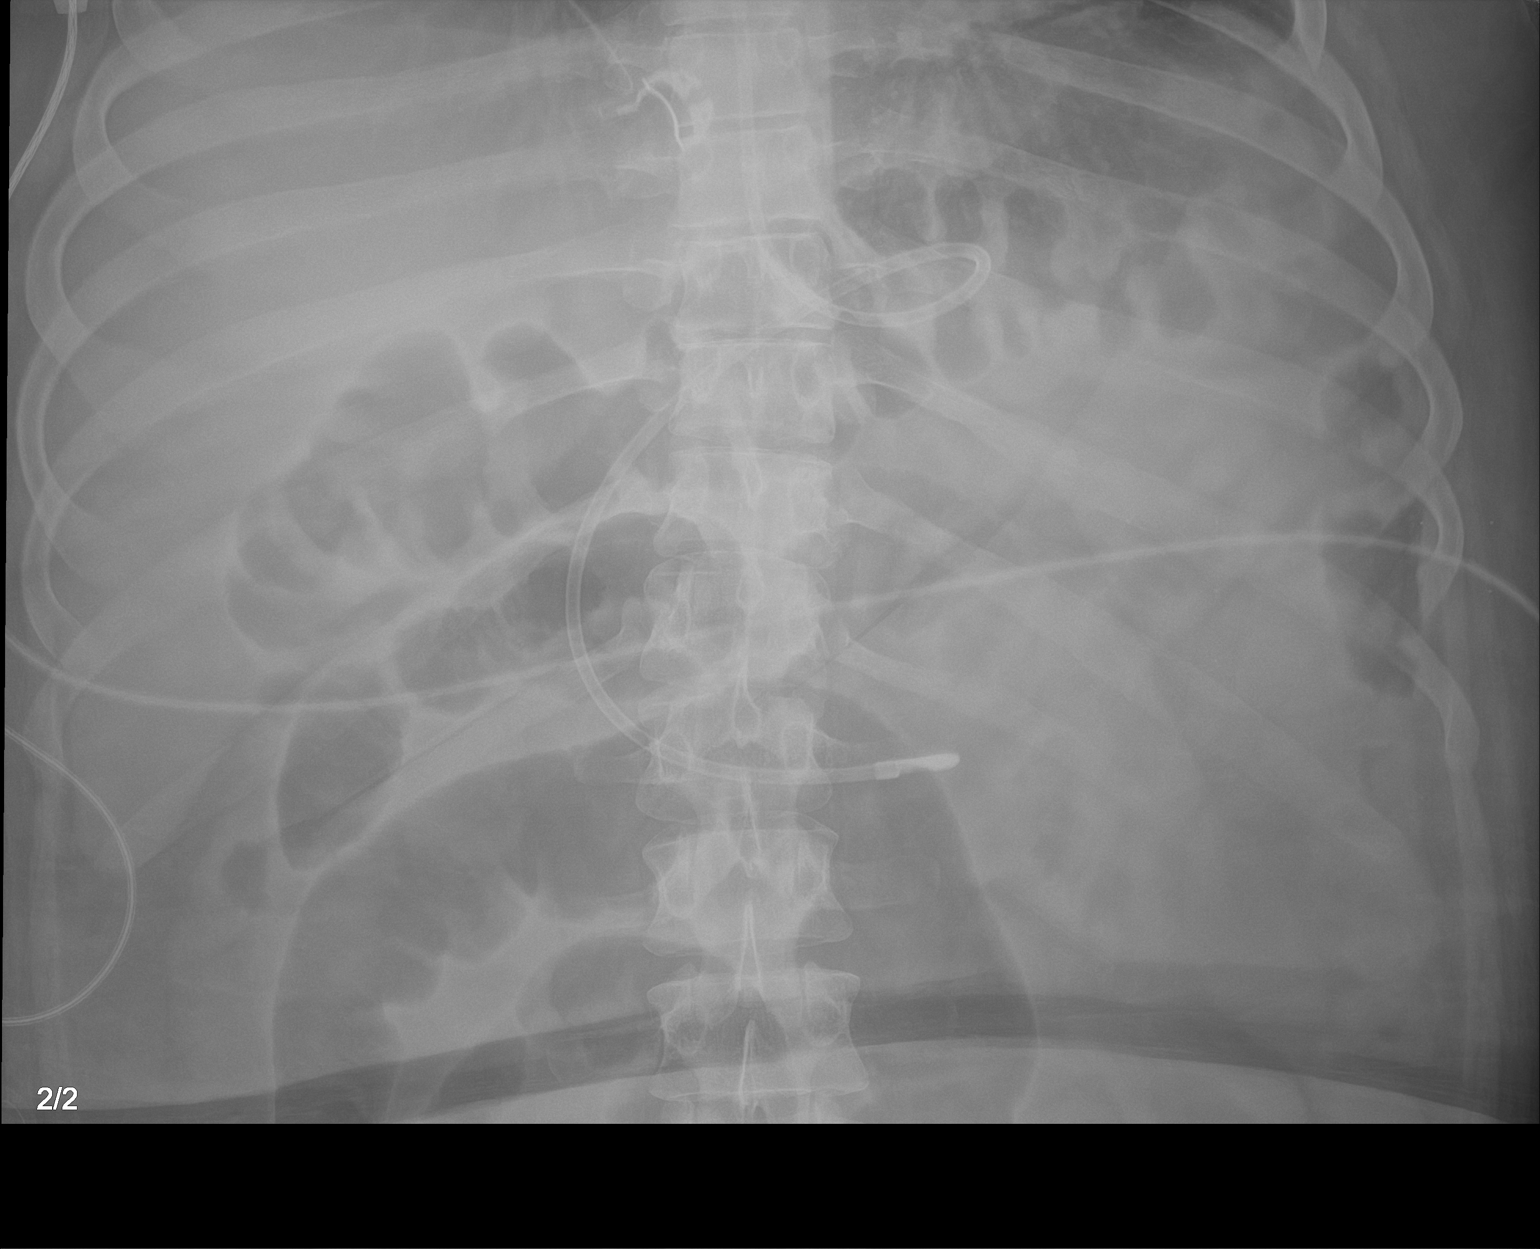

[2 of 2 positions shown; findings below may reference images not displayed]

FINDINGS: Tip of feeding tube projects over the third portion of duodenum
approaching ligament of Treitz.

Scattered gas throughout bowel, decreased from prior exam.

No bowel dilatation or bowel wall thickening.

Osseous structures unremarkable.

No urinary tract calcifications.
IMPRESSION: Nonspecific bowel gas pattern.

Decreased gaseous distention of bowel since prior study.

## 2018-12-30 IMAGING — DX DG ABDOMEN 1V
1 series · 2 of 2 positions shown · non-contrast
Comparison: [DATE]
COMPARISON: [DATE]

CLINICAL DATA: Reason for exam: Vomiting, aspiration into airway Hx
HTN, pre-diabetes, fatty liver, pancreatitis, pneumonia

EXAM:
ABDOMEN - 1 VIEW

[Series 1: abdomen · 0.14mm/px · 2 of 2 slices shown]
[im 1/2]
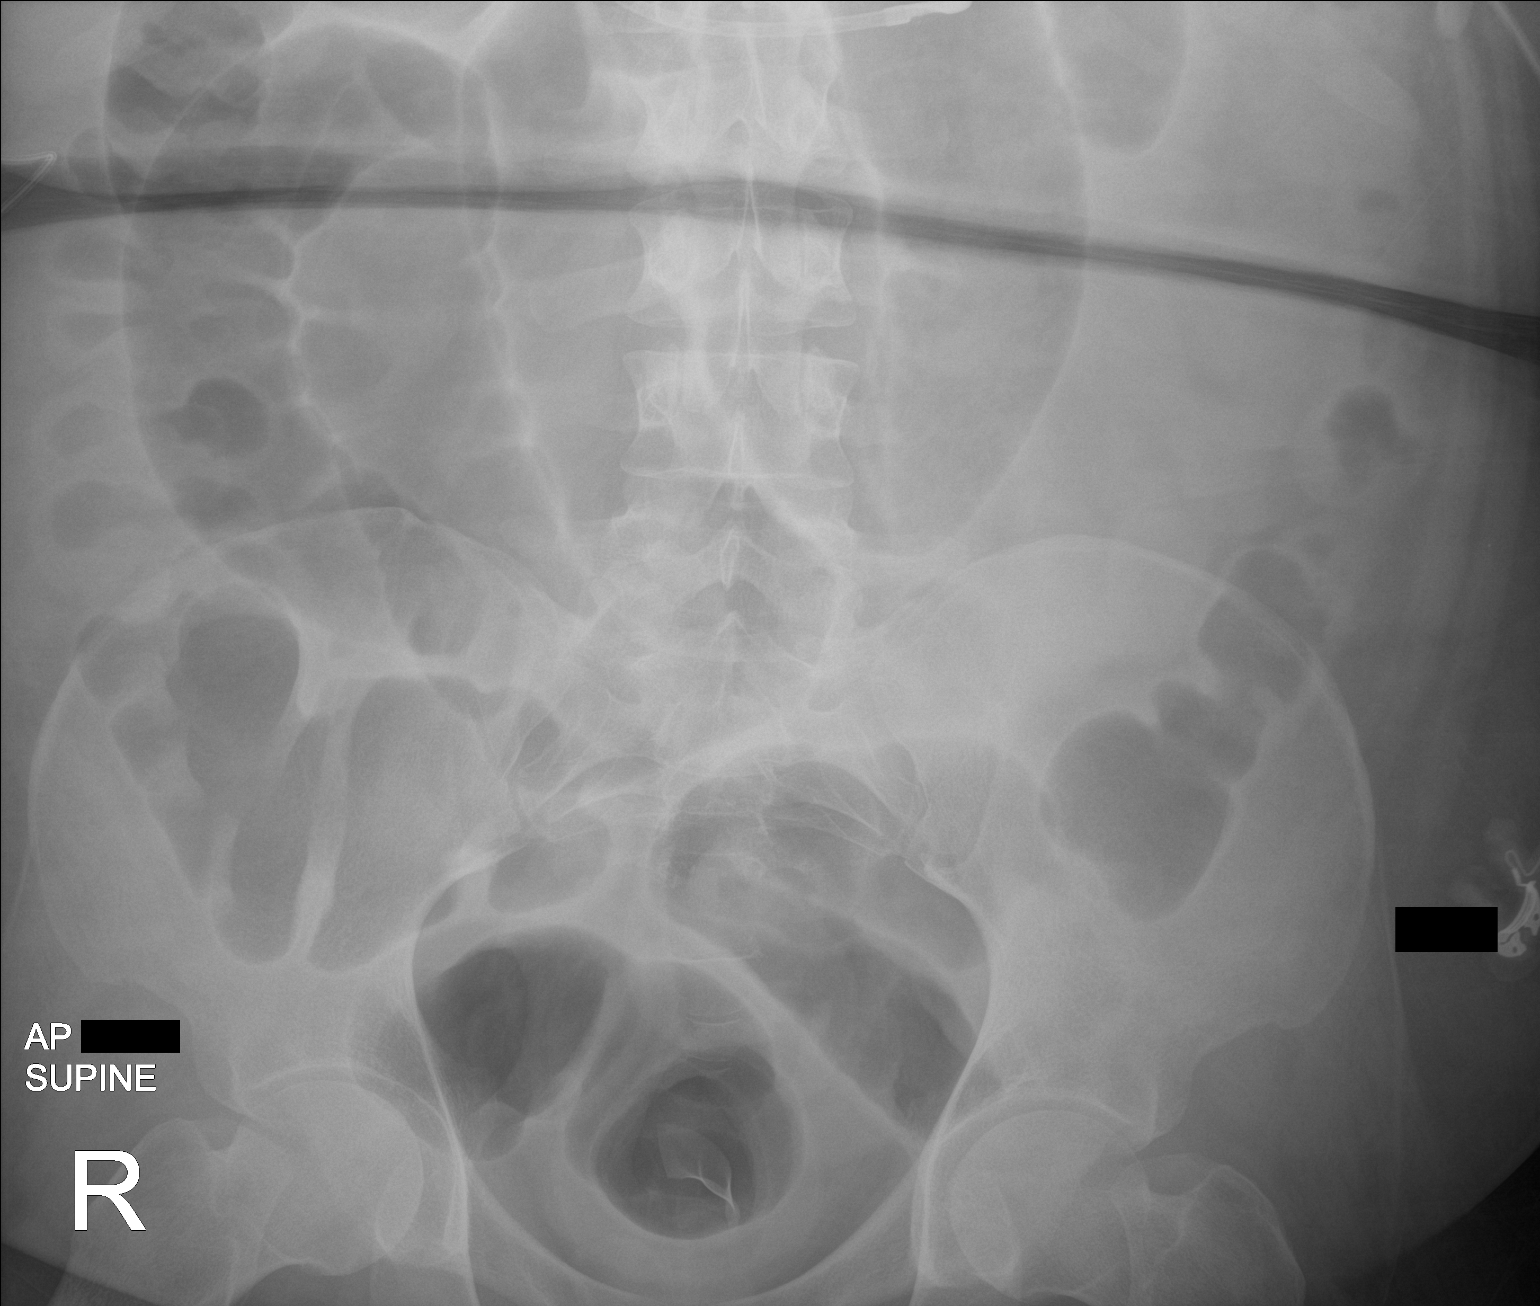
[im 2/2]
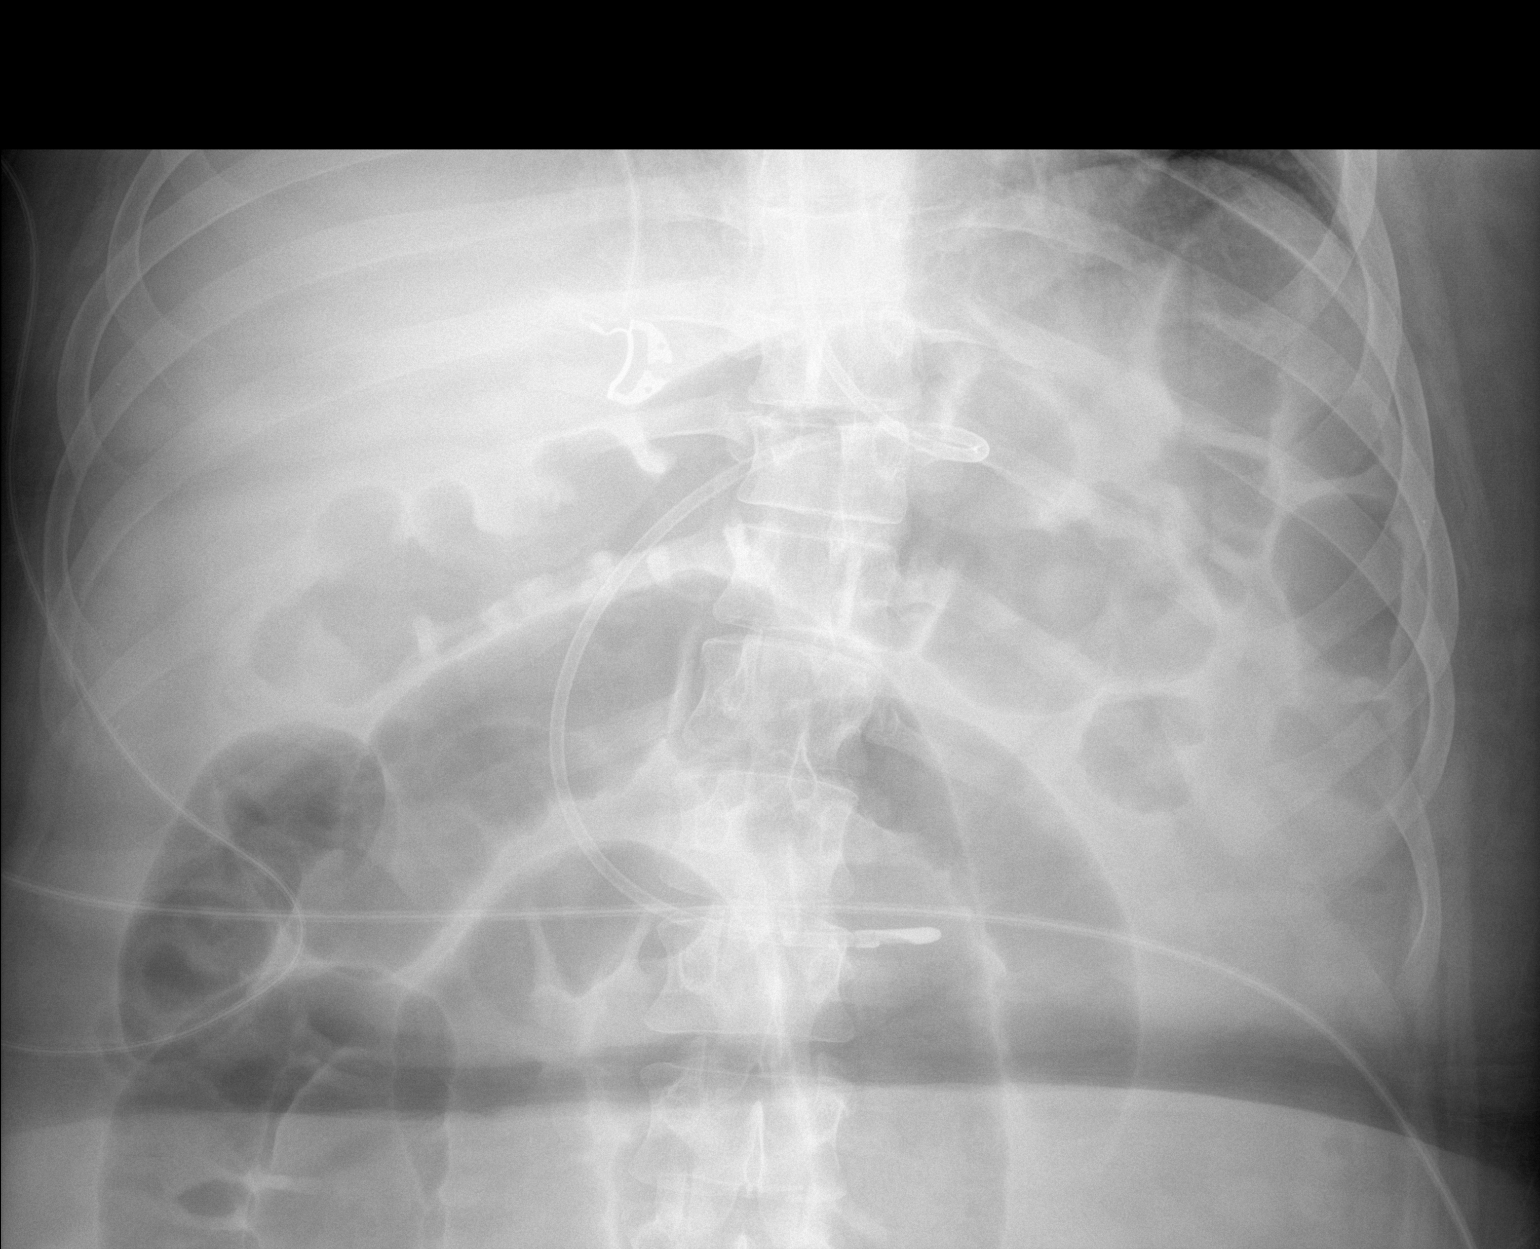

[2 of 2 positions shown; findings below may reference images not displayed]

FINDINGS: Feeding tube tip in the distal duodenum. Stomach appears
decompressed. Multiple dilated small bowel loops in the mid abdomen,
increased since previous. Scattered gas in the nondilated colon.
FINDINGS: Feeding tube tip in the distal duodenum. Stomach appears
decompressed. Multiple dilated small bowel loops in the mid abdomen,
increased since previous. Scattered gas in the nondilated colon.
IMPRESSION: 1. Multiple dilated small bowel loops in the mid abdomen, increased
since previous, suggesting obstruction or developing ileus.
2. Feeding tube tip in the distal duodenum.  Stomach  decompressed.

## 2018-12-30 IMAGING — DX DG CHEST 1V PORT
1 series · 1 of 1 positions shown · non-contrast
Comparison: [DATE]

CLINICAL DATA: Reason for exam: Vomiting, aspiration into airway Hx
HTN, pre-diabetes, fatty liver, pancreatitis, pneumonia

EXAM:
PORTABLE CHEST - 1 VIEW

[chest]
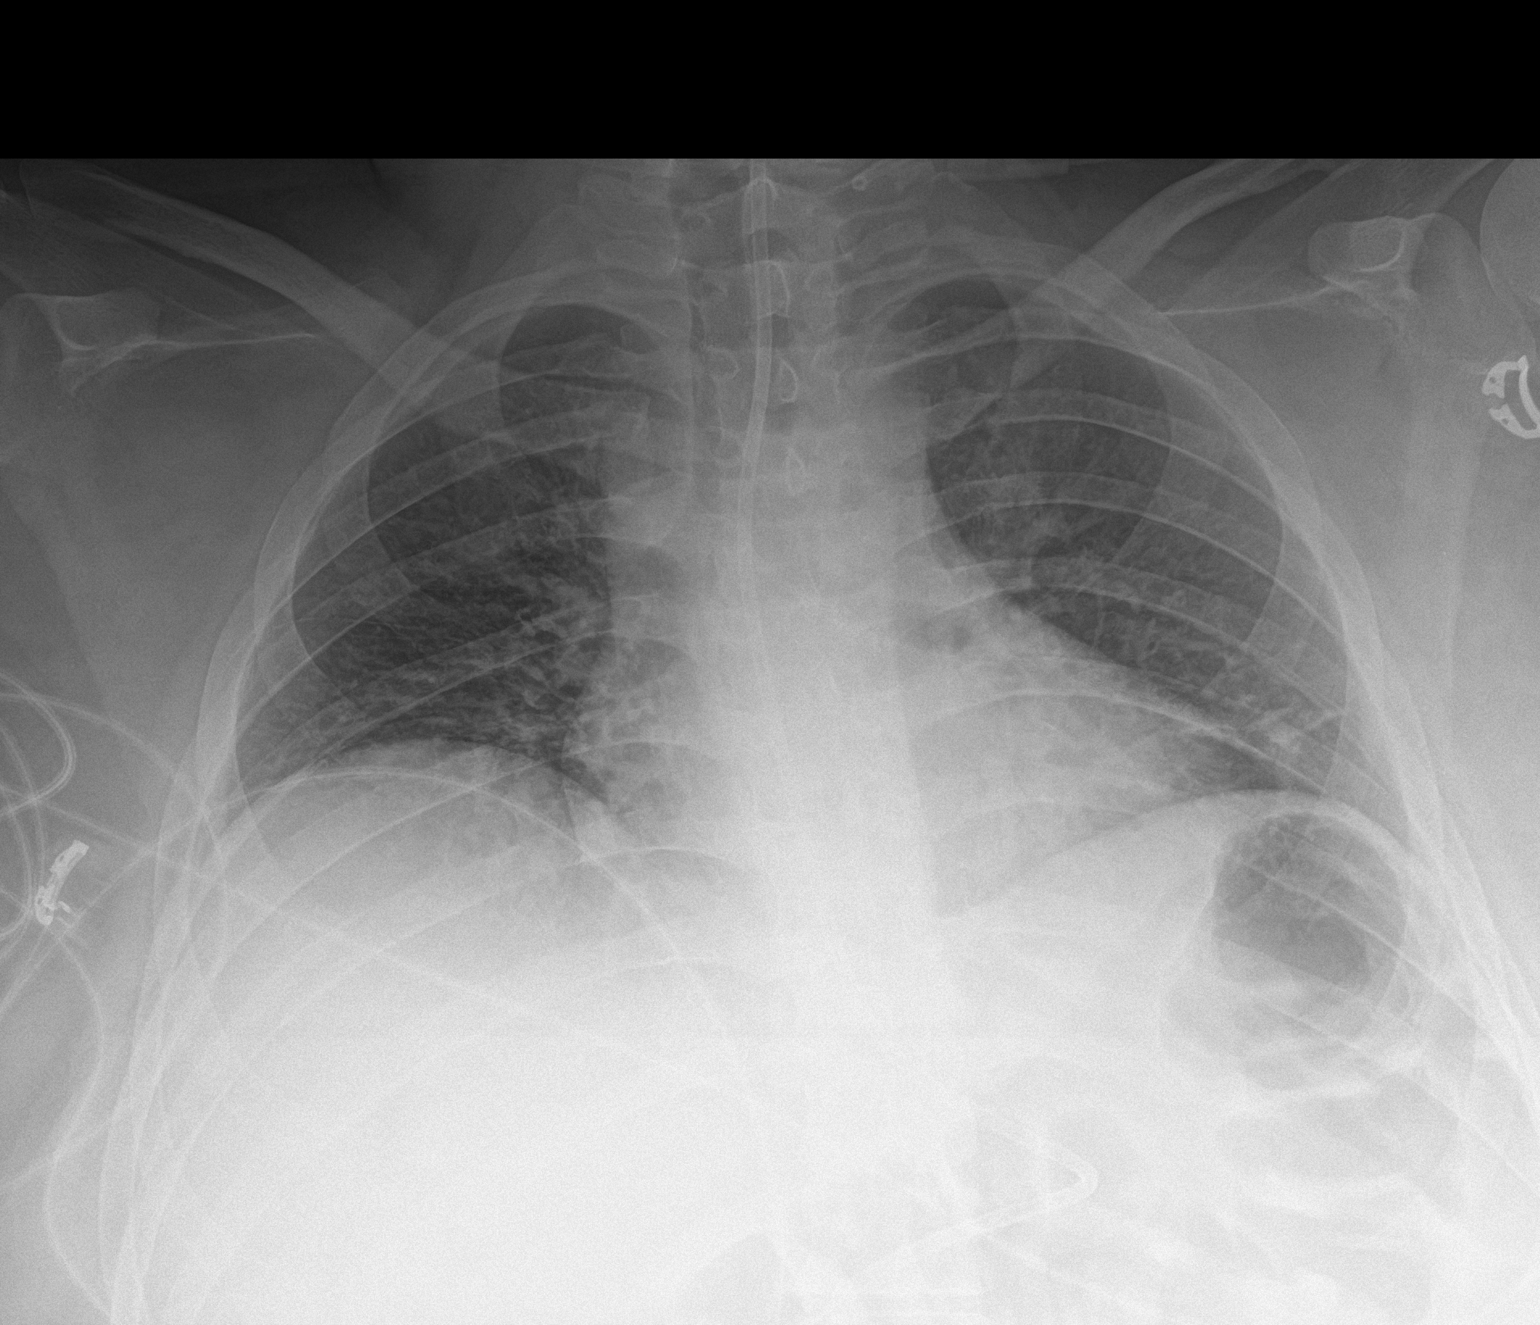

[1 of 1 positions shown; findings below may reference images not displayed]

FINDINGS: Feeding tube remains in place. Persistent low lung volumes with
coarse perihilar and bibasilar interstitial markings. No new
infiltrate or edema.

Heart size and mediastinal contours are within normal limits.

No effusion.

Visualized bones unremarkable.
IMPRESSION: 1. Stable chest x-ray. No new infiltrate or edema.
2. Stable appearance of the cardiomediastinal silhouette.

## 2018-12-30 MED ORDER — KETOROLAC TROMETHAMINE 30 MG/ML IJ SOLN
30.0000 mg | Freq: Once | INTRAMUSCULAR | Status: AC
  Administered 2018-12-30: 30 mg via INTRAVENOUS
  Filled 2018-12-30: qty 1

## 2018-12-30 MED ORDER — SODIUM CHLORIDE 0.9 % IV SOLN
INTRAVENOUS | Status: DC
  Administered 2018-12-30 – 2018-12-31 (×3): via INTRAVENOUS

## 2018-12-30 MED ORDER — SODIUM CHLORIDE 0.9 % IV BOLUS
1000.0000 mL | Freq: Once | INTRAVENOUS | Status: AC
  Administered 2018-12-30: 20:00:00 1000 mL via INTRAVENOUS

## 2018-12-30 NOTE — Progress Notes (Signed)
Patient has an elevated temperature of 102.9, axilliary, sustained HR of 146. MD paged to see if order for cooling blanket, blood culture and lactic acid lab can be obtained. Will continue monitoring.

## 2018-12-30 NOTE — Progress Notes (Signed)
  PROGRESS NOTE  Patient re-evaluated this afternoon. MEWS red due to tachycardic, tachypnea, fever, and LOC. Patient's tachycardia is improved this afternoon in the 110-120s (from 130s this morning). He had large volume stool this morning, 2025mL per nursing. Discussed case with Dr. Havery Moros. We will check for C Diff and repeat abdominal xray this afternoon. He may require NGT for decompression. Continue tylenol/ibuprofen prn for fever. Hold off on antibiotics until C Diff test available. Also discussed with bedside RN this afternoon.    Dessa Phi, DO [facility] 12/30/2018, 12:27 PM  Available via Epic secure chat 7am-7pm After these hours, please refer to coverage provider listed on [email or web address]

## 2018-12-30 NOTE — Progress Notes (Signed)
NAME:  Aaron Vega, MRN:  [id number], DOB:  [birth year], LOS: 22 ADMISSION DATE:  12/16/18, CONSULTATION DATE:  12/10/2018 REFERRING MD:  Aaron Vega - TR[facility], CHIEF COMPLAINT:  Rising lactate level.  Brief History   23 year old autistic man with abdominal distension and vomiting.  History of present illness   Known for poor and declining health status from severe autism with seizures, severe constipation recently.   His QoL was significantly better prior to 08/2018 when he suffered a severe seizure at home and struck his head. Seen in ED only but has has worsening mental status since. Per chart, this was ascribed to  hypoxic ischemic encephalopathy following prolonged seizure.   Presented via ED for increasing lethargy and vomiting and decreased urine output.   Significant disconnect regarding goals of care as currently receiving hospice care at home but still full code and mother not yet willing to place limits on care.  Was unable to have MRI performed 12/12/2018 secondary to agitation and risk of respiratory depression with sedation  Post lumbar puncture 12/14/2018  Seizure, aspirated stomach contents, intubated 11/27  Past Medical History  has Tics of organic origin; Autistic disorder; Intractable nausea and vomiting; Obesity (BMI 30-39.9); Seizure disorder (HCC); Dehydration; Vomiting in adult; Reflux esophagitis; Constipation in male; Constipation; Abnormal LFTs; Hyperglycemia; Obesity, Class III, BMI 40-49.9 (morbid obesity) (HCC); Hematemesis; Severe sepsis (HCC); Ileus (HCC); Aspiration pneumonia (HCC); Community acquired pneumonia of left lower lobe of lung; Goals of care, counseling/discussion; Palliative care by specialist; DNR (do not resuscitate) discussion; Protein-calorie malnutrition, severe; Weakness of lower extremity; Aspiration into airway; Pressure injury of skin; Fever; and Diarrhea on their problem list.   Significant Hospital Events   11/14 admission to [facility]. 11/15  increasing lactate despite fluids. 11/16 transfer to intensive care unit 11/21transferred to medical floor 11/27 transferred back to ICU 11/27 intubated for sepsis, respiratory failure 11/30 Off pressors, extubated  Consults:  PCCM 11/16. Urology 12/10/2018 Neurology  Procedures:  10/16 NG tube placed per interventional radiology 11/15 Midline > 11/20 lumbar puncture 11/27 endotracheal intubation > 11/30 11/27 central line placement, right IJ > 12/1  Significant Diagnostic Tests:  11/14 > CT abdomen/pelvis: obese, small LLL infiltrate, dilated gas filled loops of small and large bowel to rectum, no transition. No gastric distension, moderate stool burden in ascending colon.  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  No seizures.  Chest x-ray 12/7-no infiltrates. Abdominal x-ray 12/7-increased small bowel gas.  No clear obstruction.  Gas seen through to the rectum. Micro Data:  SARS-Cov2 > negative Blood cultures 11/14  > negative Urine culture 11/14 > negative Urine culture 11/26 > negative Blood cultures 11/26 > negative  Antimicrobials:  Cefepime 11/14 >11/18 Flagyl 11/14 > 11/18 Unasyn 11/28  >   Interim history/subjective:  Called to see again for increased respiratory distress.  Increased stool output today.  Abdominal x-ray consistent with small bowel ileus.   Episode of emesis following deep suctioning triggered worsening respiratory distress.  Objective   Blood pressure 119/60, pulse (!) 130, temperature 99.2 F (37.3 C), resp. rate (!) 38, height 5\' 11"  (1.803 m), weight (!) 137.3 kg, SpO2 96 %.        Intake/Output Summary (Last 24 hours) at 12/30/2018 1822 Last data filed at 12/30/2018 1700 Gross per 24  hour  Intake 1016 ml   Output 1875 ml  Net -859 ml   Filed Weights   12/29/18 0500 12/29/18 2327 12/30/18 0500  Weight: (!) 138.6 kg (!) 137.3 kg (!) 137.3 kg    Examination:  General: Adult  Male lying in bed, in NAD HEENT: Learned/AT, MM pink/moist, PERRL,  Neuro: Non verbal at baseline, able to track in room CV: s1s2 regular rate and rhythm, no murmur, rubs, or gallops,  PULM:  Upper airway rhonchi with increased respiratory distress. GI: soft, bowel sounds active in all 4 quadrants, non-tender, non-distended Extremities: warm/dry, no edema  Skin: no rashes or lesions   Assessment & Plan:   Acute hypoxic respiratory insufficiency due to poor secretion clearance from ineffective cough Recurrent aspiration-no evidence of aspiration on current x-ray. -History of obstructive sleep apnea, intolerant of CPAP  Hypertension, tachycardia   Ongoing diarrhea possible ileus Ascending weakness and encephalopathy the context of severe autism  known seizure disorder seizure activity  Ileus Hypokalemia   [name] has not been progressing well in hospital.  He has not been able to move out of the ICU for any length of time due to multifactorial weakness and neurological decline from a combination of malnutrition immobility neuromuscular weakness and his underlying cognitive disorder.  We have reiterated this to the mom again who continues to have difficulty grasping the complexity of her son's condition.  I have made it clear to her that we have not found any single cause for his decline at this point and it is unlikely that he will his previous functional state.  I have managed to clear his posterior prandial secretions with deep NT suctioning and he is more comfortable. Again, given his already poor functional status and intubation would likely further compound what is likely an irreversible state of decline.  At this time we will leave him in progressive care.  Reconsult [place] if necessary.  35 minutes spent including  greater than 2% of time in counseling or coordination of care.  Aaron Brood, MD [facility] ICU Physician [place]  Pager: [phone] Mobile: [phone] After hours: [phone].  12/30/2018, 6:28 PM

## 2018-12-30 NOTE — Progress Notes (Signed)
CPT to be held at this time due to GI issues. RT to attempt again later

## 2018-12-30 NOTE — Consult Note (Signed)
Consultation  Referring Provider:     Dessa Phi Primary Care Physician:  [facility] Primary Gastroenterologist:    Lyndel Safe     Reason for Consultation:     Ileus, aspiration         HPI:   Aaron Vega is a 23 y.o. male history of severe autism, and is nonverbal, with morbid obesity, seizure disorder, sleep apnea, and Tourette's.  He has been evaluated by our service multiple times in the past 1-2 months.  Initially seen in October for vomiting and reflux.  An EGD in October 15 showed LA grade B esophagitis, nodular GE J which is inflammatory nature, normal stomach and duodenum.  Biopsies negative for H. Pylori.  He was then seen on October 19 for worsening constipation which led to aggressive bowel regimen including a bowel prep.  Unfortunately had worsening constipation, was seen again on October 28 for a flexible sigmoidoscopy with Dr. Rush Landmark at which point time disimpaction was performed an aggressive bowel regimen was recommended.  He has had worsening mentation over time following a seizure and head trauma back in August.  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  In early November he was placed in hospice, since that time hospice was revoked.  He was readmitted in mid October with the following course as outlined per hospitalist: 11/14 Admission to [facility] for acute metabolic encephalopathy, severe sepsis due to aspiration pneumonia, ileus  11/16 Transfer to ICU due to respiratory distress, worsening lactic acidosis  11/20 LP  11/21 Transferred to [facility]  11/27 Transferred back to ICU due to aspiration event and requiring intubation  11/30 Off pressors, extubated 12/5 Transferred to progressive unit, TRH service   Early this morning the patient was found to be gasping and had dropped his oxygen saturation to 77%.  He was receiving chest PT and the patient coughed and vomited.  Oxygenation improved.  He was  also found to be unresponsive for a period of time this morning with a questionable seizure.  His tube feeds have been held.  He has developed a temperature this morning of 101.2  Chest x-ray earlier this morning showed multiple dilated small bowel loops in the mid abdomen increased from previous suggesting an ileus.  He has a feeding tube in the distal duodenum and the stomach is decompressed.  His colon is nondilated.  He previously had a CT scan on November 28 showing an adynamic ileus that had progressed from previously noted ileus on 11/14.  At that point time he did have air-fluid level seen throughout the small and large bowel, rectal tube is in place in the ascending colon was dilated at 8 cm.  His x-ray today shows no colonic dilation.  White blood cell count has risen from 13 to 17.4. He's mildly tachycardia.   Mother is at bedside.  She endorses the patient's had significant high-volume output of diarrhea today which is new for him.  Prior to this his bowels have been fairly regular, confirmed with nursing and primary physician on his bowel regimen.  He does not appear to be in any pain at this time.  Past Medical History:  Diagnosis Date  . Autistic disorder, current or active state   . Fatty liver   . Hypertension   . Obesity   . Pancreatitis    at age 45 years  . Pneumonia   . Pre-diabetes   . Seizures ([place])    as of 11/23/15 - no  seizures for 3 month  . Sleep apnea    not on cpap (can't tolerate)  . Tics of organic origin   . Tourette syndrome     Past Surgical History:  Procedure Laterality Date  . BIOPSY  11/08/2018   Procedure: BIOPSY;  Surgeon: Jerene Bears, MD;  Location: [facility];  Service: Gastroenterology;;  . ESOPHAGOGASTRODUODENOSCOPY (EGD) WITH PROPOFOL N/A 11/08/2018   Procedure: ESOPHAGOGASTRODUODENOSCOPY (EGD) WITH PROPOFOL;  Surgeon: Jerene Bears, MD;  Location: [facility];  Service: Gastroenterology;  Laterality: N/A;  . FLEXIBLE SIGMOIDOSCOPY  N/A 11/21/2018   Procedure: FLEXIBLE SIGMOIDOSCOPY;  Surgeon: Irving Copas., MD;  Location: [facility];  Service: Gastroenterology;  Laterality: N/A;  . IMPACTION REMOVAL  11/21/2018   Procedure: IMPACTION REMOVAL;  Surgeon: Irving Copas., MD;  Location: [facility];  Service: Gastroenterology;;  . RADIOLOGY WITH ANESTHESIA N/A 11/24/2015   Procedure: CT SCAN ABDOMEN AND PELVIS WITH CONTRAST;  Surgeon: Medication Radiologist, MD;  Location: [place];  Service: Radiology;  Laterality: N/A;  . TONSILLECTOMY     and adenoidectomy    Family History  Problem Relation Age of Onset  . Seizures Maternal Aunt   . Anxiety disorder Paternal Uncle   . Depression Paternal Uncle   . Bipolar disorder Paternal Uncle   . Seizures Cousin        Maternal 1st and 2nd cousins have seizures  . Anxiety disorder Other        Paternal fhx  . Depression Other        Paternal fhx  . Hypertension Other        Paternal fhx  . Bipolar disorder Other        Paternal fhx     Social History   Tobacco Use  . Smoking status: Never Smoker  . Smokeless tobacco: Never Used  Substance Use Topics  . Alcohol use: Never    Frequency: Never  . Drug use: Never    Prior to Admission medications   Medication Sig Start Date End Date Taking? Authorizing Provider  acetaminophen (TYLENOL) 650 MG suppository Place 650 mg rectally every 6 (six) hours as needed for mild pain or fever.  11/05/18  Yes [provider]  citalopram (CELEXA) 40 MG tablet Take 40 mg by mouth daily. 04/24/14  Yes [provider]  cloBAZam (ONFI) 20 MG tablet Take 60 mg by mouth 2 (two) times daily. 10/16/18  Yes [provider]  diazepam (DIASAT) 20 MG GEL Place 20 mg rectally daily as needed (for seizures lasting more than 5 minutes.).  07/03/13  Yes [provider]  diazepam (VALIUM) 5 MG tablet Take 7.5 mg by mouth every morning. May give 0.5 tablet (2.58m) as needed for agitation/anxiety  10/15/18  Yes [provider]  levothyroxine (SYNTHROID) 25 MCG tablet Take 1 tablet (25 mcg total) by mouth daily before breakfast. 11/25/18  Yes NMariel Aloe MD  lisinopril (PRINIVIL,ZESTRIL) 20 MG tablet Take 20 mg by mouth daily. Take with 538mtablet for a total dose of 2526m/22/16  Yes [provider]  lisinopril (ZESTRIL) 5 MG tablet Take 5 mg by mouth daily. Take with 81m19mblet for a total dose of 25mg33m0/20  Yes [provider]  Melatonin 5 MG TABS Take 5 mg by mouth at bedtime.   Yes [provider]  Midazolam (NAYZILAM) 5 MG/0.1ML SOLN Place 5 mg into the nose daily as needed (seizure).   Yes [provider]  nystatin (MYCOSTATIN/NYSTOP) 100000 UNIT/GM  POWD Apply 1 application topically daily. Neck, crevices, etc. 04/23/14  Yes [provider]  omeprazole (PRILOSEC) 40 MG capsule Take 1 capsule (40 mg total) by mouth 2 (two) times daily. Open capsule and sprinkle into applesauce or yoghurt. 11/27/18  Yes Mariel Aloe, MD  ondansetron (ZOFRAN ODT) 4 MG disintegrating tablet Take 1 tablet (4 mg total) by mouth every 8 (eight) hours as needed for nausea or vomiting. 11/25/18  Yes Mariel Aloe, MD  polyethylene glycol (MIRALAX) 17 g packet Take 17 g by mouth 2 (two) times daily. Patient taking differently: Take 17 g by mouth daily as needed for mild constipation.  11/25/18  Yes Mariel Aloe, MD  promethazine (PHENERGAN) 25 MG suppository Place 1 suppository (25 mg total) rectally every 6 (six) hours as needed for nausea or vomiting. 11/12/18  Yes Carmon Sails J, PA-C  sucralfate (CARAFATE) 1 GM/10ML suspension Take 10 mLs (1 g total) by mouth 4 (four) times daily -  with meals and at bedtime. 11/27/18  Yes Mariel Aloe, MD  linaclotide Rolan Lipa) 290 MCG CAPS capsule Take 1 capsule (290 mcg total) by mouth daily before breakfast. Patient not taking: Reported on 12/18/2018 11/26/18   Mariel Aloe, MD  MILK OF MAGNESIA 400  MG/5ML suspension Take 30-60 mLs by mouth daily as needed for constipation. 12/06/18   [provider]  zonisamide (ZONEGRAN) 100 MG capsule Take 100-200 mg by mouth See admin instructions. Take one capsule every other night for two weeks, then take on capsule every night for two weeks, then take 260m every night. 11/27/18   [provider]    Current Facility-Administered Medications  Medication Dose Route Frequency Provider Last Rate Last Dose  . 0.9 %  sodium chloride infusion   Intravenous Continuous CDessa Phi DO 50 mL/hr at 12/30/18 1116    . acetaminophen (TYLENOL) 160 MG/5ML solution 650 mg  650 mg Per Tube Q6H PRN AKipp Brood MD   650 mg at 12/30/18 0826  . chlorhexidine gluconate (MEDLINE KIT) (PERIDEX) 0.12 % solution 15 mL  15 mL Mouth Rinse BID AKipp Brood MD   15 mL at 12/30/18 0826  . Chlorhexidine Gluconate Cloth 2 % PADS 6 each  6 each Topical Q0600 AKipp Brood MD   6 each at 12/28/18 1300  . cloBAZam (ONFI) tablet 60 mg  60 mg Per Tube BID AKipp Brood MD   60 mg at 12/30/18 0826  . enoxaparin (LOVENOX) injection 40 mg  40 mg Subcutaneous Q12H AKipp Brood MD   40 mg at 12/29/18 2324  . feeding supplement (PRO-STAT SUGAR FREE 64) liquid 60 mL  60 mL Per Tube BID AKipp Brood MD   60 mL at 12/30/18 0827  . feeding supplement (VITAL 1.5 CAL) liquid 1,000 mL  1,000 mL Per Tube Continuous AKipp Brood MD 55 mL/hr at 12/30/18 0525    . fentaNYL (SUBLIMAZE) injection 25 mcg  25 mcg Intravenous Q2H PRN AKipp Brood MD   100 mcg at 12/21/18 1455  . ibuprofen (ADVIL) 100 MG/5ML suspension 400 mg  400 mg Per Tube Q6H PRN AKipp Brood MD   400 mg at 12/21/18 0856  . ipratropium-albuterol (DUONEB) 0.5-2.5 (3) MG/3ML nebulizer solution 3 mL  3 mL Nebulization Q6H PRN AKipp Brood MD   3 mL at 12/26/18 0023  . labetalol (NORMODYNE) injection 10 mg  10 mg Intravenous Q2H PRN AKipp Brood MD   10 mg at 12/27/18 0920  . levETIRAcetam  (KEPPRA) IVPB 1000  mg/100 mL premix  1,000 mg Intravenous Q12H [name], MD 400 mL/hr at 12/30/18 0958 1,000 mg at 12/30/18 0958  . levothyroxine (SYNTHROID) tablet 25 mcg  25 mcg Per Tube [id number] [name], MD   25 mcg at 12/30/18 0536  . lip balm (CARMEX) ointment   Topical PRN [name], MD      . lisinopril (ZESTRIL) tablet 40 mg  40 mg Per NG tube Daily Agarwala, Ravi, MD   40 mg at 12/29/18 1030  . LORazepam (ATIVAN) injection 1 mg  1 mg Intravenous Q6H PRN [name], MD   1 mg at 12/23/18 0908  . magnesium oxide (MAG-OX) tablet 400 mg  400 mg Per NG tube BID [name], MD   400 mg at 12/30/18 1001  . MEDLINE mouth rinse  15 mL Mouth Rinse q12n4p Agarwala, Ravi, MD   15 mL at 12/29/18 1247  . metoprolol tartrate (LOPRESSOR) 25 mg/10 mL oral suspension 125 mg  125 mg Per NG tube BID [name], MD   125 mg at 12/30/18 0956  . metoprolol tartrate (LOPRESSOR) injection 5 mg  5 mg Intravenous Q6H PRN [name], MD   5 mg at 12/30/18 0648  . multivitamin liquid 15 mL  15 mL Per Tube Daily [name], MD   15 mL at 12/30/18 0955  . nystatin (MYCOSTATIN/NYSTOP) topical powder   Topical Daily Agarwala, Ravi, MD      . ondansetron (ZOFRAN) injection 4 mg  4 mg Intravenous Q6H PRN [name], MD   4 mg at 12/30/18 0429  . pantoprazole sodium (PROTONIX) 40 mg/20 mL oral suspension 40 mg  40 mg Per Tube BID [name], MD   40 mg at 12/30/18 0828  . potassium chloride 20 MEQ/15ML (10%) solution 40 mEq  40 mEq Per Tube Daily [name], MD   40 mEq at 12/30/18 0825  . sodium chloride flush (NS) 0.9 % injection 10-40 mL  10-40 mL Intracatheter Q12H Agarwala, Ravi, MD   10 mL at 12/30/18 1014  . sodium chloride flush (NS) 0.9 % injection 10-40 mL  10-40 mL Intracatheter PRN [name], MD      . tamsulosin (FLOMAX) capsule 0.4 mg  0.4 mg Oral Daily Agarwala, Einar Grad, MD   0.4 mg at 12/30/18 0828  . thiamine (B-1) injection 100 mg  100 mg Intravenous Daily  [name], MD   100 mg at 12/30/18 0829  . vitamin B-12 (CYANOCOBALAMIN) tablet 1,000 mcg  1,000 mcg Per Tube Daily [name], MD   1,000 mcg at 12/29/18 1023  . white petrolatum (VASELINE) gel   Topical PRN [name], MD   0.2 application at 96/75/91 1512    Allergies as of 12/04/2018  . (No Known Allergies)     Review of Systems:    As per HPI, otherwise negative    Physical Exam:  Vital signs in last 24 hours: Temp:  [97.7 F (36.5 C)-101.2 F (38.4 C)] 101.2 F (38.4 C) (12/06 0800) Pulse Rate:  [114-144] 130 (12/06 0700) Resp:  [18-43] 38 (12/06 0700) BP: (86-138)/(36-78) 107/61 (12/06 0700) SpO2:  [85 %-100 %] 96 % (12/06 0700) Weight:  [137.3 kg] 137.3 kg (12/06 0500) Last BM Date: 12/29/18 General:   Pleasant male in NAD, NG in place Head:  Normocephalic and atraumatic. Eyes:   No icterus.   Conjunctiva pink. Ears:  Normal auditory acuity. Neck:  Supple Lungs:  Respirations even and mildly labored.  Heart:  Tachycardic rate and rhythm;  Abdomen:  Soft, nontender, protuberant but not significantly distended. . No appreciable masses or hepatomegaly.  Rectal:  Not performed.  Extremities:  Without edema. Skin:  Intact without significant lesions or rashes.   LAB RESULTS: Recent Labs    12/28/18 0620 12/29/18 0953 12/30/18 0641  WBC 15.7* 13.0* 17.4*  HGB 11.2* 11.0* 11.2*  HCT 35.8* 34.2* 35.6*  PLT 854* 897* 947*   BMET Recent Labs    12/29/18 0541 12/30/18 0641  NA 134* 136  K 5.0 4.5  CL 96* 97*  CO2 26 26  GLUCOSE 98 113*  BUN 13 21*  CREATININE 0.34* 0.49*  CALCIUM 9.4 9.3   LFT No results for input(s): PROT, ALBUMIN, AST, ALT, ALKPHOS, BILITOT, BILIDIR, IBILI in the last 72 hours. PT/INR No results for input(s): LABPROT, INR in the last 72 hours.  STUDIES: Dg Abd 1 View  Result Date: 12/30/2018 CLINICAL DATA:  Reason for exam: Vomiting, aspiration into airway Hx HTN, pre-diabetes, fatty liver, pancreatitis, pneumonia  EXAM: ABDOMEN - 1 VIEW COMPARISON:  12/25/2018 FINDINGS: Feeding tube tip in the distal duodenum. Stomach appears decompressed. Multiple dilated small bowel loops in the mid abdomen, increased since previous. Scattered gas in the nondilated colon. CLINICAL DATA:  Reason for exam: Vomiting, aspiration into airway Hx HTN, pre-diabetes, fatty liver, pancreatitis, pneumonia EXAM: ABDOMEN - 1 VIEW COMPARISON:  12/25/2018 FINDINGS: Feeding tube tip in the distal duodenum. Stomach appears decompressed. Multiple dilated small bowel loops in the mid abdomen, increased since previous. Scattered gas in the nondilated colon. IMPRESSION: 1. Multiple dilated small bowel loops in the mid abdomen, increased since previous, suggesting obstruction or developing ileus. 2. Feeding tube tip in the distal duodenum.  Stomach  decompressed. Electronically Signed   By: Lucrezia Europe M.D.   On: 12/30/2018 09:43   Dg Chest Port 1 View  Result Date: 12/30/2018 CLINICAL DATA:  Reason for exam: Vomiting, aspiration into airway Hx HTN, pre-diabetes, fatty liver, pancreatitis, pneumonia EXAM: PORTABLE CHEST - 1 VIEW COMPARISON:  12/28/2018 FINDINGS: Feeding tube remains in place. Persistent low lung volumes with coarse perihilar and bibasilar interstitial markings. No new infiltrate or edema. Heart size and mediastinal contours are within normal limits. No effusion. Visualized bones unremarkable. IMPRESSION: 1. Stable chest x-ray. No new infiltrate or edema. 2. Stable appearance of the cardiomediastinal silhouette. Electronically Signed   By: Lucrezia Europe M.D.   On: 12/30/2018 09:42   Dg Chest Port 1 View  Result Date: 12/28/2018 CLINICAL DATA:  Updated status.  Fever. EXAM: PORTABLE CHEST 1 VIEW COMPARISON:  December 26, 2018 FINDINGS: The feeding tube terminates below today's film. Mild opacity in left base is somewhat streaky in platelike laterally. Low lung volumes. No pneumothorax. The lungs are otherwise clear. The cardiomediastinal  silhouette is stable. IMPRESSION: Streaky opacity in left base is favored to represent atelectasis given the platelike configuration laterally. Developing infiltrate not completely excluded. Recommend attention on follow-up. Electronically Signed   By: Dorise Bullion III M.D   On: 12/28/2018 21:44        Impression / Plan:   23 year old male with severe autism, recurrent aspiration, significant colonic dysmotility with constipation and history of fecal impaction, prolonged hospital course with recurrent aspiration and sepsis.  Patient appears to have had a recurrent episode of aspiration this morning and has some dilated loops of small intestine on x-ray, colon is decompressed.  Since his x-ray this AM he is put out over 2 L of liquid stool and is not acting obstructed at  this time.  Given his recent antibiotics and hospital course, high volume stool output, I'm concerned for C. difficile and will check stool for C. difficile now. This does not seem c/w overflow as his prior constipation appears to have been pretty well treated and having BMs up to this point. If he he has C diff it could be causing his leukocytosis, however also concerned the leukocytosis could be due to recurrent aspiration episode. I would repeat an x-ray in a few hours to see if his bowel is more decompressed given the high volume stool output.  If it is I would hold off on NG placement at this time, if he appears to have a persistent ileus I would place an NG tube for decompression.  Very difficult situation, he clearly has some underlying dysmotility of his bowel, there has been no focal point of obstruction on prior scans, more so consistent with ileus.  His prior EGD did not show any concerning pathology.  We will await results of initial stool test and x-ray this afternoon.  We will continue to follow. Dr. Rush Landmark to assume the inpatient GI service tomorrow. Mother confirmed he is full case and hospice was withdrawn.   Gateway Cellar, MD [facility] Gastroenterology

## 2018-12-30 NOTE — Progress Notes (Addendum)
PROGRESS NOTE    Aaron Vega  ZDG:[id number] DOB: 1995/07/25 DOA: 12/19/2018 PCP: [facility]     Brief Narrative:  Aaron Vega is a 23 yo male with past medical history significant for severe autism with seizures, nonverbal at baseline, who presented with declining health.  He suffered a severe seizure at home and struck his head in August 2020 and has been worsening in mentation since that time.  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  He presented to the emergency department for increasing lethargy, vomiting and decreased urine output.  He had been previously enrolled in hospice at home, but mother has now revoked hospice.   11/14 Admission to [facility] for acute metabolic encephalopathy, severe sepsis due to aspiration pneumonia, ileus  11/16 Transfer to ICU due to respiratory distress, worsening lactic acidosis  11/20 LP  11/21 Transferred to [facility]  11/27 Transferred back to ICU due to aspiration event and requiring intubation  11/30 Off pressors, extubated 12/5 Transferred to progressive unit, TRH service   New events last 24 hours / Subjective: Early this morning, patient found to be gasping and moaning with heavy breathing.  His SPO2 had dropped to 77%, then improved to 91% without any increase in oxygen requirement.  About 5 minutes into chest PT, patient was coughing and vomited.  SPO2 improved to 98% on 4 L nasal cannula oxygen.  Patient was unresponsive to verbal and physical stimuli this morning, question of possible seizure versus distress response which was noted last week during his aspiration event on 11/27.  Tube feeding held.  Fever this morning as well 101.2.  Assessment & Plan:   Principal Problem:   Severe sepsis ([place]) Active Problems:   Autistic disorder   Intractable nausea and vomiting   Seizure disorder (HCC)   Constipation in male   Obesity, Class III, BMI 40-49.9  (morbid obesity) ([place])   Ileus (HCC)   Aspiration pneumonia ([place])   Community acquired pneumonia of left lower lobe of lung   Goals of care, counseling/discussion   Palliative care by specialist   DNR (do not resuscitate) discussion   Protein-calorie malnutrition, severe   Weakness of lower extremity   Aspiration into airway   Pressure injury of skin   Fever    Acute on chronic respiratory failure -Obstructive sleep apnea history, not tolerant of positive pressure ventilation -Now extubated -Currently on 4L [place] O2  -Continue deep suction prn, chest PT -At high risk for aspiration and reintubation  Severe sepsis secondary to aspiration pneumonia -S/p Cefepime, flagyl 11/14-11/18; Unasyn 11/28-12/2  -Blood culture 11/14 negative -Blood culture 11/26 negative  -Continue aspiration precautions -SLP following, NPO for now -PEG placement; consulted IR  -Repeat chest x-ray this morning without acute infiltrate  Acute metabolic encephalopathy, cause unclear likely multifactorial 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 Vit B12 159. Continue thiamine, B12, MV  -Continue Keppra, Onfi -Neurology signed off. Patient may follow up with his Neurologist at [facility] Dr. Opal Sidles. 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 Lyndel Safe as outpatient    Hypothyroidism -Continue synthroid   HTN -Continue lisinopril, metoprolol     DVT prophylaxis: Lovenox  Code Status: Full code Family Communication: Mother at bedside sleeping Disposition Plan: Hold tube feedings for now, PEG tube placement in consideration for next week   Consultants:   PCCM  Neurology  Palliative care  GI   Antimicrobials:  Anti-infectives (From admission, onward)   Start     Dose/Rate Route Frequency Ordered Stop   12/22/18 0900  ampicillin-sulbactam (UNASYN) 1.5 g in sodium chloride 0.9 % 100 mL IVPB  Status:  Discontinued     1.5 g 200 mL/hr over 30 Minutes Intravenous Every 6 hours 12/22/18 0851 12/22/18 0857   12/22/18 0900  Ampicillin-Sulbactam (UNASYN) 3 g in sodium chloride 0.9 % 100 mL IVPB     3 g 200 mL/hr over 30 Minutes Intravenous Every 6 hours 12/22/18 0857 12/26/18 2105   12/19/18 1400  erythromycin (EES) 400 MG/5ML suspension 500 mg     500 mg Per Tube Every 8 hours 12/19/18 1132     12/18/18 1500  erythromycin 500 mg in sodium chloride 0.9 % 100 mL IVPB  Status:  Discontinued     500 mg 100 mL/hr over 60 Minutes Intravenous Every 8 hours 12/18/18 1449 12/19/18 1132   12/15/18 1400  erythromycin (EES) 400 MG/5ML suspension 400 mg  Status:  Discontinued     400 mg Per Tube Every 8 hours 12/15/18 1012 12/15/18 1130   12/15/18 1400  erythromycin 500 mg in sodium chloride 0.9 % 100 mL IVPB  Status:  Discontinued     500 mg 100 mL/hr over 60 Minutes Intravenous Every 8 hours 12/15/18 1130 12/18/18 1449   12/09/18 0600  vancomycin (VANCOCIN) 1,250 mg in sodium chloride 0.9 % 250 mL IVPB  Status:  Discontinued     1,250 mg 166.7 mL/hr over 90 Minutes Intravenous Every 12 hours 12/02/2018 1539 12/10/18 1043   12/09/18 0000  ceFEPIme (MAXIPIME) 2 g in sodium chloride 0.9 % 100 mL IVPB  Status:  Discontinued     2 g 200 mL/hr over 30 Minutes Intravenous Every 8 hours 12/20/2018 1539 12/11/18 1801   12/09/18 0000  metroNIDAZOLE (FLAGYL) IVPB  500 mg  Status:  Discontinued     500 mg 100 mL/hr over 60 Minutes Intravenous Every 8 hours 12/22/2018 2146 12/11/18 1801   12/16/2018 1415  ceFEPIme (MAXIPIME) 2 g in sodium chloride 0.9 % 100 mL IVPB     2 g 200 mL/hr over 30 Minutes Intravenous  Once 12/04/2018 1411 11/29/2018 1538   11/29/2018 1415  metroNIDAZOLE (FLAGYL) IVPB 500 mg     500 mg 100 mL/hr over 60 Minutes Intravenous  Once 12/17/2018 1411 11/26/2018 1609   12/21/2018 1415  vancomycin (VANCOCIN) IVPB 1000 mg/200 mL premix  Status:  Discontinued     1,000 mg 200 mL/hr over 60 Minutes Intravenous  Once 12/24/2018 1411 12/05/2018 1414   11/28/2018 1415  vancomycin (VANCOCIN) 2,500 mg in sodium chloride 0.9 % 500 mL IVPB     2,500 mg 250 mL/hr over 120 Minutes Intravenous  Once 12/05/2018 1414 12/10/2018 1710       Objective: Vitals:   12/30/18 0601 12/30/18 0645 12/30/18 0700 12/30/18 0800  BP: (!) 119/55  107/61   Pulse: (!) 142 (!) 134 (!) 130   Resp: (!) 42 (!) 28 Marland Kitchen)  38   Temp:    (!) 101.2 F (38.4 C)  TempSrc:    Axillary  SpO2: 97% 97% 96%   Weight:      Height:        Intake/Output Summary (Last 24 hours) at 12/30/2018 0945 Last data filed at 12/30/2018 0700 Gross per 24 hour  Intake 1776 ml  Output 3100 ml  Net -1324 ml   Filed Weights   12/29/18 0500 12/29/18 2327 12/30/18 0500  Weight: (!) 138.6 kg (!) 137.3 kg (!) 137.3 kg    Examination: General exam: Appears chronically ill-appearing Respiratory system: Clear to auscultation anteriorly.  Tachypneic on 4 L nasal cannula O2 Cardiovascular system: S1 & S2 heard, tachycardic, regular rhythm.  Gastrointestinal system: Abdomen is nondistended, soft and nontender. Normal bowel sounds heard. Central nervous system: Nonverbal at baseline Extremities: Symmetric in appearance bilaterally  Skin: No rashes, lesions or ulcers on exposed skin   Data Reviewed: I have personally reviewed following labs and imaging studies  CBC: Recent Labs  Lab 12/26/18 0748 12/27/18 0843  12/28/18 0620 12/29/18 0953 12/30/18 0641  WBC 8.7 12.6* 15.7* 13.0* 17.4*  HGB 10.0* 10.6* 11.2* 11.0* 11.2*  HCT 30.3* 33.9* 35.8* 34.2* 35.6*  MCV 85.4 89.0 89.9 87.7 90.4  PLT 620* 787* 854* 897* 371*   Basic Metabolic Panel: Recent Labs  Lab 12/25/18 0407 12/26/18 0100 12/26/18 0748  12/27/18 0843 12/27/18 1645 12/28/18 0620 12/28/18 1809 12/29/18 0541 12/30/18 0641  NA 141 141  --   --  140  --   --   --  134* 136  K 3.1* 3.2*  --   --  4.2  --   --   --  5.0 4.5  CL 106  --   --   --  104  --   --   --  96* 97*  CO2 26  --   --   --  28  --   --   --  26 26  GLUCOSE 89  --   --   --  90  --   --   --  98 113*  BUN 5*  --   --   --  9  --   --   --  13 21*  CREATININE <0.30*  --   --   --  0.34*  --   --   --  0.34* 0.49*  CALCIUM 8.6*  --   --   --  9.0  --   --   --  9.4 9.3  MG 1.5*  --  1.7  --   --   --   --   --   --   --   PHOS 3.4  --  3.4   < > 3.1 3.3 4.0 4.5 5.8*  --    < > = values in this interval not displayed.   GFR: Estimated Creatinine Clearance: 206.8 mL/min (A) (by C-G formula based on SCr of 0.49 mg/dL (L)). Liver Function Tests: No results for input(s): AST, ALT, ALKPHOS, BILITOT, PROT, ALBUMIN in the last 168 hours. No results for input(s): LIPASE, AMYLASE in the last 168 hours. No results for input(s): AMMONIA in the last 168 hours. Coagulation Profile: No results for input(s): INR, PROTIME in the last 168 hours. Cardiac Enzymes: No results for input(s): CKTOTAL, CKMB, CKMBINDEX, TROPONINI in the last 168 hours. BNP (last 3 results) No results for input(s): PROBNP in the last 8760 hours. HbA1C: No results  for input(s): HGBA1C in the last 72 hours. CBG: Recent Labs  Lab 12/29/18 1603 12/29/18 1929 12/29/18 2325 12/30/18 0342 12/30/18 0824  GLUCAP 97 99 114* 121* 103*   Lipid Profile: No results for input(s): CHOL, HDL, LDLCALC, TRIG, CHOLHDL, LDLDIRECT in the last 72 hours. Thyroid Function Tests: No results for input(s): TSH,  T4TOTAL, FREET4, T3FREE, THYROIDAB in the last 72 hours. Anemia Panel: No results for input(s): VITAMINB12, FOLATE, FERRITIN, TIBC, IRON, RETICCTPCT in the last 72 hours. Sepsis Labs: No results for input(s): PROCALCITON, LATICACIDVEN in the last 168 hours.  Recent Results (from the past 240 hour(s))  Culture, Urine     Status: None   Collection Time: 12/20/18  7:59 PM   Specimen: Urine, Clean Catch  Result Value Ref Range Status   Specimen Description URINE, CLEAN CATCH  Final   Special Requests NONE  Final   Culture   Final    NO GROWTH Performed at [facility], [address], [place], [place] [address]    Report Status 12/23/2018 FINAL  Final      Radiology Studies: Dg Chest Port 1 View  Result Date: 12/30/2018 CLINICAL DATA:  Reason for exam: Vomiting, aspiration into airway Hx HTN, pre-diabetes, fatty liver, pancreatitis, pneumonia EXAM: PORTABLE CHEST - 1 VIEW COMPARISON:  12/28/2018 FINDINGS: Feeding tube remains in place. Persistent low lung volumes with coarse perihilar and bibasilar interstitial markings. No new infiltrate or edema. Heart size and mediastinal contours are within normal limits. No effusion. Visualized bones unremarkable. IMPRESSION: 1. Stable chest x-ray. No new infiltrate or edema. 2. Stable appearance of the cardiomediastinal silhouette. Electronically Signed   By: Lucrezia Europe M.D.   On: 12/30/2018 09:42   Dg Chest Port 1 View  Result Date: 12/28/2018 CLINICAL DATA:  Updated status.  Fever. EXAM: PORTABLE CHEST 1 VIEW COMPARISON:  December 26, 2018 FINDINGS: The feeding tube terminates below today's film. Mild opacity in left base is somewhat streaky in platelike laterally. Low lung volumes. No pneumothorax. The lungs are otherwise clear. The cardiomediastinal silhouette is stable. IMPRESSION: Streaky opacity in left base is favored to represent atelectasis given the platelike configuration laterally. Developing infiltrate not completely excluded.  Recommend attention on follow-up. Electronically Signed   By: Dorise Bullion III M.D   On: 12/28/2018 21:44      Scheduled Meds:  chlorhexidine gluconate (MEDLINE KIT)  15 mL Mouth Rinse BID   Chlorhexidine Gluconate Cloth  6 each Topical Q0600   cloBAZam  60 mg Per Tube BID   enoxaparin (LOVENOX) injection  40 mg Subcutaneous Q12H   erythromycin  500 mg Per Tube Q8H   feeding supplement (PRO-STAT SUGAR FREE 64)  60 mL Per Tube BID   free water  200 mL Per Tube Q8H   levothyroxine  25 mcg Per Tube Q0600   lisinopril  40 mg Per NG tube Daily   magnesium oxide  400 mg Per NG tube BID   mouth rinse  15 mL Mouth Rinse q12n4p   metoprolol tartrate  125 mg Per NG tube BID   multivitamin  15 mL Per Tube Daily   nystatin   Topical Daily   pantoprazole sodium  40 mg Per Tube BID   potassium chloride  40 mEq Per Tube Daily   psyllium  1 packet Per Tube BID   sodium chloride flush  10-40 mL Intracatheter Q12H   tamsulosin  0.4 mg Oral Daily   thiamine injection  100 mg Intravenous Daily  vitamin B-12  1,000 mcg Per Tube Daily   Continuous Infusions:  feeding supplement (VITAL 1.5 CAL) 55 mL/hr at 12/30/18 0525   levETIRAcetam 1,000 mg (12/29/18 2232)     LOS: 22 days      Time spent: 40 minutes   Dessa Phi, DO [facility] 12/30/2018, 9:45 AM   Available via Epic secure chat 7am-7pm After these hours, please refer to coverage provider listed on [email or web address]

## 2018-12-30 NOTE — Progress Notes (Signed)
  PROGRESS NOTE  Patient re-evaluated for third time today. MEWS remains red, score 10. Tachycardic, tachypenic, febrile. He is now on nonrebreather, looks flushed, lethargic. ABG obtained on 100% FiO2 shows elevated A-a gradient. Overall, he looks acute on chronically ill. Spoke with mother at bedside and father over the phone. Bedside RN also in room during my exam. I spoke with PCCM for another opinion due to concern for decompensation today. Both parents re-iterate to me that he remains full code and willing to trial mechanical ventilation if it comes to that.    Dessa Phi, DO [facility] 12/30/2018, 6:00 PM  Available via Epic secure chat 7am-7pm After these hours, please refer to coverage provider listed on [email or web address]

## 2018-12-30 NOTE — Progress Notes (Signed)
Upon arrival to room for 0400 am CPT, pt appears to be having periods of apnea/very shallow breathing, followed by pt gasping and moaning and breathing heavily and fast. During this time, pt spo2 dropped to 77% but rose back up to 91% with no change in o2. With SPo2 being 91%, I increased [place] to 4L. About 5 minutes into Chest vest pt was coughing and vomited which I held suction while he was vomiting and called for assistance. Probe was changed and SPo2 now reading 98% on 4L.

## 2018-12-31 ENCOUNTER — Inpatient Hospital Stay: Payer: Self-pay

## 2018-12-31 ENCOUNTER — Inpatient Hospital Stay (HOSPITAL_COMMUNITY)

## 2018-12-31 DIAGNOSIS — R933 Abnormal findings on diagnostic imaging of other parts of digestive tract: Secondary | ICD-10-CM

## 2018-12-31 LAB — CBC
HCT: 37 % — ABNORMAL LOW (ref 39.0–52.0)
Hemoglobin: 11.4 g/dL — ABNORMAL LOW (ref 13.0–17.0)
MCH: 28.8 pg (ref 26.0–34.0)
MCHC: 30.8 g/dL (ref 30.0–36.0)
MCV: 93.4 fL (ref 80.0–100.0)
Platelets: 671 10*3/uL — ABNORMAL HIGH (ref 150–400)
RBC: 3.96 MIL/uL — ABNORMAL LOW (ref 4.22–5.81)
RDW: 18.5 % — ABNORMAL HIGH (ref 11.5–15.5)
WBC: 19.3 10*3/uL — ABNORMAL HIGH (ref 4.0–10.5)
nRBC: 0 % (ref 0.0–0.2)

## 2018-12-31 LAB — GLUCOSE, CAPILLARY
Glucose-Capillary: 97 mg/dL (ref 70–99)
Glucose-Capillary: 105 mg/dL — ABNORMAL HIGH (ref 70–99)
Glucose-Capillary: 86 mg/dL (ref 70–99)
Glucose-Capillary: 127 mg/dL — ABNORMAL HIGH (ref 70–99)
Glucose-Capillary: 94 mg/dL (ref 70–99)

## 2018-12-31 LAB — BASIC METABOLIC PANEL
Anion gap: 10 (ref 5–15)
BUN: 17 mg/dL (ref 6–20)
CO2: 28 mmol/L (ref 22–32)
Calcium: 9.8 mg/dL (ref 8.9–10.3)
Chloride: 104 mmol/L (ref 98–111)
Creatinine, Ser: 0.44 mg/dL — ABNORMAL LOW (ref 0.61–1.24)
GFR calc Af Amer: >60 mL/min (ref >60)
GFR calc non Af Amer: >60 mL/min (ref >60)
Glucose, Bld: 96 mg/dL (ref 70–99)
Potassium: 4 mmol/L (ref 3.5–5.1)
Sodium: 142 mmol/L (ref 135–145)

## 2018-12-31 LAB — PATHOLOGIST SMEAR REVIEW: Path Review: 12072020

## 2018-12-31 IMAGING — DX DG CHEST 1V PORT
1 series · 1 of 1 positions shown · non-contrast
Comparison: [DATE], [DATE]

CLINICAL DATA: 23-year-old male with a history aspiration

EXAM:
PORTABLE CHEST 1 VIEW

[chest ap]
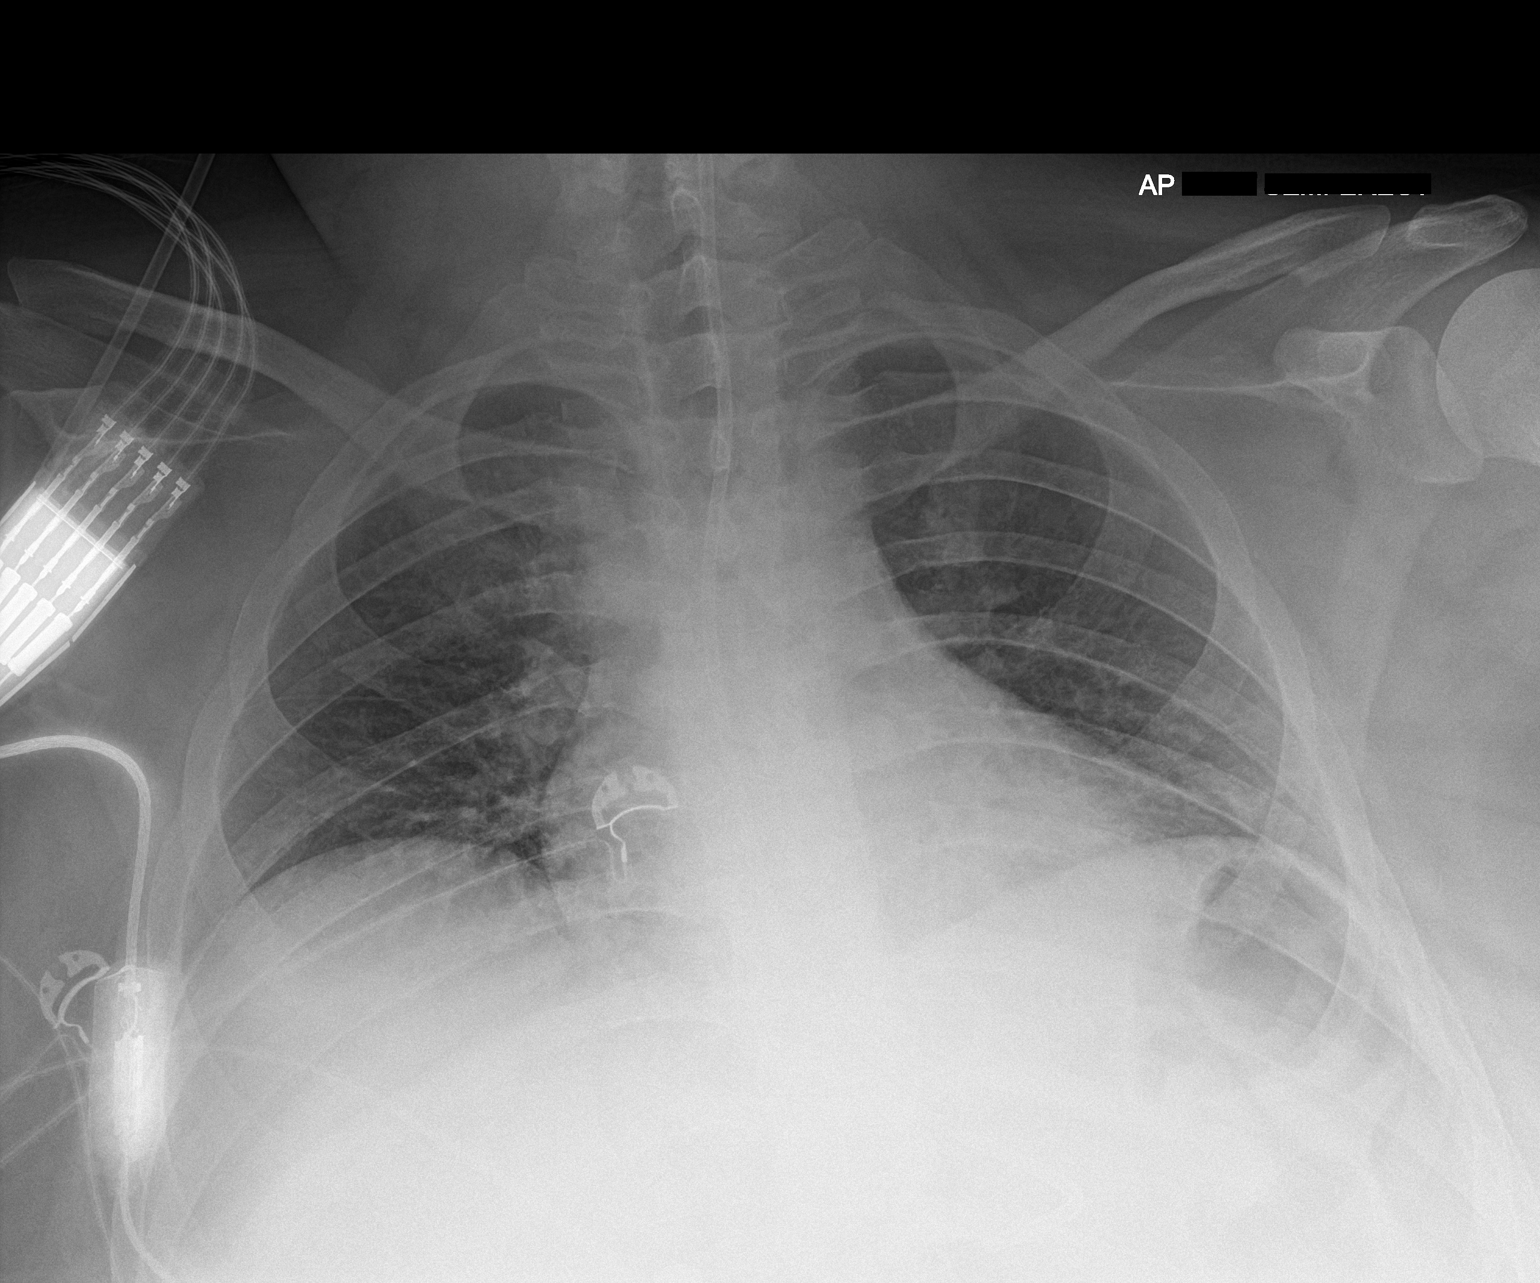

[1 of 1 positions shown; findings below may reference images not displayed]

FINDINGS: Cardiomediastinal silhouette unchanged in size and contour. Low lung
volumes persist with linear opacities at the lung bases. No
pneumothorax or large pleural effusion. Crowding of the interstitium
and the vasculature.

Enteric tube projects over the mediastinum and terminates out of the
field of view.
IMPRESSION: Unchanged appearance of the chest x-ray with low lung volumes and
likely atelectasis.

Unchanged enteric feeding tube.

## 2018-12-31 MED ORDER — MAGNESIUM OXIDE 400 (241.3 MG) MG PO TABS: 200.0000 mg | ORAL_TABLET | Freq: Every day | ORAL | Status: DC

## 2018-12-31 MED ORDER — SODIUM CHLORIDE 0.9% FLUSH: 10.0000 mL | INTRAVENOUS | Status: DC | PRN

## 2018-12-31 MED ORDER — SODIUM CHLORIDE 0.9 % IV SOLN
3.0000 g | Freq: Four times a day (QID) | INTRAVENOUS | Status: AC
  Administered 2018-12-31 – 2019-01-05 (×20): 3 g via INTRAVENOUS
  Filled 2018-12-31 (×6): qty 3
  Filled 2018-12-31: qty 8
  Filled 2018-12-31 (×4): qty 3
  Filled 2018-12-31: qty 8
  Filled 2018-12-31 (×3): qty 3
  Filled 2018-12-31: qty 8
  Filled 2018-12-31 (×5): qty 3

## 2018-12-31 MED ORDER — SODIUM CHLORIDE 0.9% FLUSH
10.0000 mL | Freq: Two times a day (BID) | INTRAVENOUS | Status: DC
  Administered 2018-12-31 – 2019-01-13 (×20): 10 mL
  Administered 2019-01-13: 20 mL
  Administered 2019-01-14 – 2019-01-26 (×16): 10 mL

## 2018-12-31 NOTE — Progress Notes (Addendum)
PROGRESS NOTE    Aaron Vega  DZH:[id number] DOB: 17-Dec-1995 DOA: 11/30/2018 PCP: [facility]     Brief Narrative:  Aaron Vega is a 23 yo male with past medical history significant for severe autism with seizures, nonverbal at baseline, who presented with declining health.  He suffered a severe seizure at home and struck his head in August 2020 and has been worsening in mentation since that time.  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  He presented to the emergency department for increasing lethargy, vomiting and decreased urine output.  He had been previously enrolled in hospice at home, but mother has now revoked hospice.   11/14 Admission to [facility] for acute metabolic encephalopathy, severe sepsis due to aspiration pneumonia, ileus  11/16 Transfer to ICU due to respiratory distress, worsening lactic acidosis  11/20 LP  11/21 Transferred to [facility]  11/27 Transferred back to ICU due to aspiration event and requiring intubation  11/30 Off pressors, extubated 12/5 Transferred to progressive unit, TRH service  12/6 Early AM had vomiting and possibly aspiration event. PCCM re-consulted, patient improved after deep NT suctioning   New events last 24 hours / Subjective: Patient appears to be much more comfortable this morning although remains in tenuous condition overall.  Fevers overnight as well, but afebrile this morning.  Tachycardia much better, in the 110s this morning.  Remains on nonrebreather mask this morning.  Left upper extremity midline appears to be infiltrated, PICC line ordered today  Assessment & Plan:   Principal Problem:   Severe sepsis ([place]) Active Problems:   Autistic disorder   Intractable nausea and vomiting   Seizure disorder (HCC)   Constipation in male   Obesity, Class III, BMI 40-49.9 (morbid obesity) ([place])   Ileus (HCC)   Aspiration pneumonia ([place])   Community acquired  pneumonia of left lower lobe of lung   Goals of care, counseling/discussion   Palliative care by specialist   DNR (do not resuscitate) discussion   Protein-calorie malnutrition, severe   Weakness of lower extremity   Aspiration into airway   Pressure injury of skin   Fever   Diarrhea    Acute on chronic respiratory failure -Obstructive sleep apnea history, not tolerant of positive pressure ventilation -Now extubated -Currently on nonrebreather -Continue deep suction prn, chest PT -At high risk for aspiration and reintubation  Severe sepsis secondary to aspiration pneumonia -S/p Cefepime, flagyl 11/14-11/18; Unasyn 11/28-12/2; Unasyn 12/7-->  -Blood culture 11/14 negative -Blood culture 11/26 negative  -Continue aspiration precautions -SLP following, NPO for now -PEG placement; consulted IR  -Repeat chest x-ray this morning without acute infiltrate  Acute metabolic encephalopathy, cause unclear likely multifactorial 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 Vit B12 159. Continue thiamine, B12, MV  -Continue Keppra, Onfi -Neurology signed off. Patient may follow up with his Neurologist at [facility] Dr. Opal Sidles. 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  -Completed 5 days of IVIG and high-dose thiamine replacement  -Continue thiamine  -PT OT   Ileus -Has had recurrent, improving, then worsening ileus since hospitalization 11/14 -Overnight 12/5 had episode of vomiting, repeat abdominal xray showing multiple dilated small bowel loops, increased from previous, suggesting obstruction or developing ileus, again -Repeat abdominal x-ray showing nonspecific bowel gas pattern,  decreased gaseous distention of bowel since prior study -Appreciate GI  assistance  Hypothyroidism -Continue synthroid   HTN -Continue lisinopril, metoprolol     DVT prophylaxis: Lovenox  Code Status: Full code Family Communication: Mother at bedside  Disposition Plan: Discussed with mother that patient's clinical status remains poor, although he does look better this morning than last night. Her hope is for patient to continue to improve and eventually go home with a PEG tube.  We discussed that patient has a long road ahead of him and will need to remain clinically stable prior to going home, which may be days to weeks from now.   Consultants:   PCCM  Neurology  Palliative care  GI   Antimicrobials:  Anti-infectives (From admission, onward)   Start     Dose/Rate Route Frequency Ordered Stop   12/22/18 0900  ampicillin-sulbactam (UNASYN) 1.5 g in sodium chloride 0.9 % 100 mL IVPB  Status:  Discontinued     1.5 g 200 mL/hr over 30 Minutes Intravenous Every 6 hours 12/22/18 0851 12/22/18 0857   12/22/18 0900  Ampicillin-Sulbactam (UNASYN) 3 g in sodium chloride 0.9 % 100 mL IVPB     3 g 200 mL/hr over 30 Minutes Intravenous Every 6 hours 12/22/18 0857 12/26/18 2105   12/19/18 1400  erythromycin (EES) 400 MG/5ML suspension 500 mg  Status:  Discontinued     500 mg Per Tube Every 8 hours 12/19/18 1132 12/30/18 1013   12/18/18 1500  erythromycin 500 mg in sodium chloride 0.9 % 100 mL IVPB  Status:  Discontinued     500 mg 100 mL/hr over 60 Minutes Intravenous Every 8 hours 12/18/18 1449 12/19/18 1132   12/15/18 1400  erythromycin (EES) 400 MG/5ML suspension 400 mg  Status:  Discontinued     400 mg Per Tube Every 8 hours 12/15/18 1012 12/15/18 1130   12/15/18 1400  erythromycin 500 mg in sodium chloride 0.9 % 100 mL IVPB  Status:  Discontinued     500 mg 100 mL/hr over 60 Minutes Intravenous Every 8 hours 12/15/18 1130 12/18/18 1449   12/09/18 0600  vancomycin (VANCOCIN) 1,250 mg in sodium chloride 0.9 % 250 mL IVPB  Status:  Discontinued      1,250 mg 166.7 mL/hr over 90 Minutes Intravenous Every 12 hours 11/29/2018 1539 12/10/18 1043   12/09/18 0000  ceFEPIme (MAXIPIME) 2 g in sodium chloride 0.9 % 100 mL IVPB  Status:  Discontinued     2 g 200 mL/hr over 30 Minutes Intravenous Every 8 hours 11/25/2018 1539 12/11/18 1801   12/09/18 0000  metroNIDAZOLE (FLAGYL) IVPB 500 mg  Status:  Discontinued     500 mg 100 mL/hr over 60 Minutes Intravenous Every 8 hours 12/13/2018 2146 12/11/18 1801   12/19/2018 1415  ceFEPIme (MAXIPIME) 2 g in sodium chloride 0.9 % 100 mL IVPB     2 g 200 mL/hr over 30 Minutes Intravenous  Once 12/06/2018 1411 12/02/2018 1538   12/07/2018 1415  metroNIDAZOLE (FLAGYL) IVPB 500 mg     500 mg 100 mL/hr over 60 Minutes Intravenous  Once 11/28/2018 1411 12/01/2018 1609   12/23/2018 1415  vancomycin (VANCOCIN) IVPB 1000 mg/200 mL premix  Status:  Discontinued     1,000 mg 200 mL/hr over 60 Minutes Intravenous  Once 12/12/2018 1411 12/23/2018 1414   12/14/2018 1415  vancomycin (VANCOCIN) 2,500 mg in sodium chloride 0.9 % 500 mL IVPB     2,500 mg 250 mL/hr over 120 Minutes Intravenous  Once  12/10/2018 1414 12/02/2018 1710       Objective: Vitals:   12/31/18 0815 12/31/18 0830 12/31/18 0845 12/31/18 0900  BP: 137/89 (!) 149/86 (!) 152/84 (!) 126/55  Pulse: (!) 110 (!) 113 (!) 118 (!) 113  Resp: (!) '22 20 17 ' (!) 24  Temp:    99.5 F (37.5 C)  TempSrc:    Other (Comment)  SpO2: 100% 100% 96% 100%  Weight:      Height:        Intake/Output Summary (Last 24 hours) at 12/31/2018 0956 Last data filed at 12/31/2018 0400 Gross per 24 hour  Intake 1110 ml  Output 1900 ml  Net -790 ml   Filed Weights   12/29/18 2327 12/30/18 0500 12/31/18 0500  Weight: (!) 137.3 kg (!) 137.3 kg (!) 138.2 kg    Examination: General exam: Appears calm, chronically ill-appearing Respiratory system: + Bilateral rhonchi, on nonrebreather Cardiovascular system: S1 & S2 heard, tachycardic, regular rhythm. No pedal edema. Gastrointestinal system: Abdomen  is nondistended, soft and nontender, rectal tube in place Central nervous system: Alert to voice.  Nonverbal at baseline Extremities: Symmetric in appearance bilaterally  Skin: No rashes, lesions or ulcers on exposed skin    Data Reviewed: I have personally reviewed following labs and imaging studies  CBC: Recent Labs  Lab 12/27/18 0843 12/28/18 0620 12/29/18 0953 12/30/18 0641 12/31/18 0648  WBC 12.6* 15.7* 13.0* 17.4* 19.3*  HGB 10.6* 11.2* 11.0* 11.2* 11.4*  HCT 33.9* 35.8* 34.2* 35.6* 37.0*  MCV 89.0 89.9 87.7 90.4 93.4  PLT 787* 854* 897* 947* 683*   Basic Metabolic Panel: Recent Labs  Lab 12/25/18 0407 12/26/18 0100 12/26/18 0748  12/27/18 0843 12/27/18 1645 12/28/18 0620 12/28/18 1809 12/29/18 0541 12/30/18 0641 12/31/18 0648  NA 141 141  --   --  140  --   --   --  134* 136 142  K 3.1* 3.2*  --   --  4.2  --   --   --  5.0 4.5 4.0  CL 106  --   --   --  104  --   --   --  96* 97* 104  CO2 26  --   --   --  28  --   --   --  '26 26 28  ' GLUCOSE 89  --   --   --  90  --   --   --  98 113* 96  BUN 5*  --   --   --  9  --   --   --  13 21* 17  CREATININE <0.30*  --   --   --  0.34*  --   --   --  0.34* 0.49* 0.44*  CALCIUM 8.6*  --   --   --  9.0  --   --   --  9.4 9.3 9.8  MG 1.5*  --  1.7  --   --   --   --   --   --   --   --   PHOS 3.4  --  3.4   < > 3.1 3.3 4.0 4.5 5.8*  --   --    < > = values in this interval not displayed.   GFR: Estimated Creatinine Clearance: 207.6 mL/min (A) (by C-G formula based on SCr of 0.44 mg/dL (L)). Liver Function Tests: No results for input(s): AST, ALT, ALKPHOS, BILITOT, PROT, ALBUMIN in the last 168 hours. No results  for input(s): LIPASE, AMYLASE in the last 168 hours. No results for input(s): AMMONIA in the last 168 hours. Coagulation Profile: No results for input(s): INR, PROTIME in the last 168 hours. Cardiac Enzymes: No results for input(s): CKTOTAL, CKMB, CKMBINDEX, TROPONINI in the last 168 hours. BNP (last 3  results) No results for input(s): PROBNP in the last 8760 hours. HbA1C: No results for input(s): HGBA1C in the last 72 hours. CBG: Recent Labs  Lab 12/30/18 1733 12/30/18 1934 12/30/18 2344 12/31/18 0355 12/31/18 0756  GLUCAP 109* 101* 111* 97 105*   Lipid Profile: No results for input(s): CHOL, HDL, LDLCALC, TRIG, CHOLHDL, LDLDIRECT in the last 72 hours. Thyroid Function Tests: No results for input(s): TSH, T4TOTAL, FREET4, T3FREE, THYROIDAB in the last 72 hours. Anemia Panel: No results for input(s): VITAMINB12, FOLATE, FERRITIN, TIBC, IRON, RETICCTPCT in the last 72 hours. Sepsis Labs: No results for input(s): PROCALCITON, LATICACIDVEN in the last 168 hours.  Recent Results (from the past 240 hour(s))  C difficile quick scan w PCR reflex     Status: None   Collection Time: 12/30/18 11:50 AM   Specimen: STOOL  Result Value Ref Range Status   C Diff antigen NEGATIVE NEGATIVE Final   C Diff toxin NEGATIVE NEGATIVE Final   C Diff interpretation No C. difficile detected.  Final    Comment: Performed at [facility], [place] [address], [place], [place] [address]      Radiology Studies: Dg Abd 1 View  Result Date: 12/30/2018 CLINICAL DATA:  Reason for exam: Vomiting, aspiration into airway Hx HTN, pre-diabetes, fatty liver, pancreatitis, pneumonia EXAM: ABDOMEN - 1 VIEW COMPARISON:  12/25/2018 FINDINGS: Feeding tube tip in the distal duodenum. Stomach appears decompressed. Multiple dilated small bowel loops in the mid abdomen, increased since previous. Scattered gas in the nondilated colon. CLINICAL DATA:  Reason for exam: Vomiting, aspiration into airway Hx HTN, pre-diabetes, fatty liver, pancreatitis, pneumonia EXAM: ABDOMEN - 1 VIEW COMPARISON:  12/25/2018 FINDINGS: Feeding tube tip in the distal duodenum. Stomach appears decompressed. Multiple dilated small bowel loops in the mid abdomen, increased since previous. Scattered gas in the nondilated colon. IMPRESSION: 1. Multiple  dilated small bowel loops in the mid abdomen, increased since previous, suggesting obstruction or developing ileus. 2. Feeding tube tip in the distal duodenum.  Stomach  decompressed. Electronically Signed   By: Lucrezia Europe M.D.   On: 12/30/2018 09:43   Dg Chest Port 1 View  Result Date: 12/31/2018 CLINICAL DATA:  23 year old male with a history aspiration EXAM: PORTABLE CHEST 1 VIEW COMPARISON:  12/30/2018, 12/28/2018 FINDINGS: Cardiomediastinal silhouette unchanged in size and contour. Low lung volumes persist with linear opacities at the lung bases. No pneumothorax or large pleural effusion. Crowding of the interstitium and the vasculature. Enteric tube projects over the mediastinum and terminates out of the field of view. IMPRESSION: Unchanged appearance of the chest x-ray with low lung volumes and likely atelectasis. Unchanged enteric feeding tube. Electronically Signed   By: Corrie Mckusick D.O.   On: 12/31/2018 07:45   Dg Chest Port 1 View  Result Date: 12/30/2018 CLINICAL DATA:  Reason for exam: Vomiting, aspiration into airway Hx HTN, pre-diabetes, fatty liver, pancreatitis, pneumonia EXAM: PORTABLE CHEST - 1 VIEW COMPARISON:  12/28/2018 FINDINGS: Feeding tube remains in place. Persistent low lung volumes with coarse perihilar and bibasilar interstitial markings. No new infiltrate or edema. Heart size and mediastinal contours are within normal limits. No effusion. Visualized bones unremarkable. IMPRESSION: 1. Stable chest x-ray. No new  infiltrate or edema. 2. Stable appearance of the cardiomediastinal silhouette. Electronically Signed   By: Lucrezia Europe M.D.   On: 12/30/2018 09:42   Dg Abd Portable 1v  Result Date: 12/30/2018 CLINICAL DATA:  Ileus EXAM: PORTABLE ABDOMEN - 1 VIEW COMPARISON:  Portable exam 1715 hours compared to 0906 hours FINDINGS: Tip of feeding tube projects over the third portion of duodenum approaching ligament of Treitz. Scattered gas throughout bowel, decreased from prior exam.  No bowel dilatation or bowel wall thickening. Osseous structures unremarkable. No urinary tract calcifications. IMPRESSION: Nonspecific bowel gas pattern. Decreased gaseous distention of bowel since prior study. Electronically Signed   By: Lavonia Dana M.D.   On: 12/30/2018 19:20   [place] Ekg Site Rite  Result Date: 12/31/2018 If Site Rite image not attached, placement could not be confirmed due to current cardiac rhythm.     Scheduled Meds:  chlorhexidine gluconate (MEDLINE KIT)  15 mL Mouth Rinse BID   Chlorhexidine Gluconate Cloth  6 each Topical Q0600   cloBAZam  60 mg Per Tube BID   enoxaparin (LOVENOX) injection  40 mg Subcutaneous Q12H   feeding supplement (PRO-STAT SUGAR FREE 64)  60 mL Per Tube BID   levothyroxine  25 mcg Per Tube Q0600   lisinopril  40 mg Per NG tube Daily   magnesium oxide  400 mg Per NG tube BID   mouth rinse  15 mL Mouth Rinse q12n4p   metoprolol tartrate  125 mg Per NG tube BID   multivitamin  15 mL Per Tube Daily   nystatin   Topical Daily   pantoprazole sodium  40 mg Per Tube BID   potassium chloride  40 mEq Per Tube Daily   sodium chloride flush  10-40 mL Intracatheter Q12H   tamsulosin  0.4 mg Oral Daily   thiamine injection  100 mg Intravenous Daily   vitamin B-12  1,000 mcg Per Tube Daily   Continuous Infusions:  sodium chloride 50 mL/hr at 12/30/18 2148   feeding supplement (VITAL 1.5 CAL) 55 mL/hr at 12/30/18 0525   levETIRAcetam 1,000 mg (12/30/18 2122)     LOS: 23 days      Time spent: 35 minutes   Dessa Phi, DO [facility] 12/31/2018, 9:56 AM   Available via Epic secure chat 7am-7pm After these hours, please refer to coverage provider listed on [email or web address]

## 2018-12-31 NOTE — Progress Notes (Signed)
Peripherally Inserted Central Catheter/Midline Placement  The IV Nurse has discussed with the patient and/or persons authorized to consent for the patient, the purpose of this procedure and the potential benefits and risks involved with this procedure.  The benefits include less needle sticks, lab draws from the catheter, and the patient may be discharged home with the catheter. Risks include, but not limited to, infection, bleeding, blood clot (thrombus formation), and puncture of an artery; nerve damage and irregular heartbeat and possibility to perform a PICC exchange if needed/ordered by physician.  Alternatives to this procedure were also discussed.  Bard Power PICC patient education guide, fact sheet on infection prevention and patient information card has been provided to patient /or left at bedside.    PICC/Midline Placement Documentation  PICC Double Lumen 12/31/18 PICC Right Brachial 48 cm 2 cm (Active)  Indication for Insertion or Continuance of Line Prolonged intravenous therapies 12/31/18 1200  Exposed Catheter (cm) 2 cm 12/31/18 1200  Site Assessment Clean;Dry;Intact 12/31/18 1200  Lumen #1 Status Flushed;Blood return noted;Saline locked 12/31/18 1200  Lumen #2 Status Flushed;Blood return noted;Saline locked 12/31/18 1200  Dressing Type Transparent 12/31/18 1200  Dressing Status Clean;Dry;Intact;Antimicrobial disc in place 12/31/18 1200  Dressing Change Due 01/07/19 12/31/18 1200       Scotty Court 12/31/2018, 12:04 PM

## 2018-12-31 NOTE — Progress Notes (Signed)
OT Cancellation Note  Patient Details Name: Aaron Vega MRN: [id number] DOB: Mar 09, 1995   Cancelled Treatment:    Reason Eval/Treat Not Completed: Other (comment)(elevated HR 130 supine RR 30 supine) OT team attmepting and PICC line being placed. OT second attmepting and demonstrates elevated HR 130 supine and RR 30. OT team to check back tomorrow at a more appropriate time. Spoke with RN mike and agreed to hold at this time.   Billey Chang, OTR/L  Acute Rehabilitation Services Pager: [phone] Office: [phone] .  12/31/2018, 2:43 PM

## 2018-12-31 NOTE — Progress Notes (Signed)
Pharmacy Antibiotic Note  Aaron Vega is a 23 y.o. male admitted on 12-20-2018 with hypoxic ischemic encephalopathy following prolonged seizure.  Patient was on broad spectrum antibiotics for sepsis and aspiration PNA. Pharmacy has been consulted to resume Unasyn for aspiration PNA.  Renal function stable, Tmax 102.9, WBC 19.3.  Plan: Unasyn 3gm IV Q6H Pharmacy will sign off.  Abx LOT per MD.  Height: 5\' 11"  (180.3 cm) Weight: (!) 304 lb 10.8 oz (138.2 kg) IBW/kg (Calculated) : 75.3  Temp (24hrs), Avg:100.4 F (38 C), Min:98.8 F (37.1 C), Max:102.9 F (39.4 C)  Recent Labs  Lab 12/25/18 0407  12/27/18 0843 12/28/18 0620 12/29/18 0541 12/29/18 0953 12/30/18 0641 12/31/18 0648  WBC 6.4   < > 12.6* 15.7*  --  13.0* 17.4* 19.3*  CREATININE <0.30*  --  0.34*  --  0.34*  --  0.49* 0.44*   < > = values in this interval not displayed.    Estimated Creatinine Clearance: 207.6 mL/min (A) (by C-G formula based on SCr of 0.44 mg/dL (L)).    No Known Allergies  Vanc 11/14>>11/16 Cefepime 11/14>>11/17 Flagyl 11/14 >>11/17 Unasyn 11/28 >> 12/2  11/14 BCx - negative 11/14 UCx - negative 11/14 covid - negative 11/16 MRSA PCR - negative 11/26 BCx - negative 12/6 C.diff - negative  [name] D. Mina Marble, PharmD, BCPS, [place] 12/31/2018, 10:56 AM

## 2018-12-31 NOTE — Progress Notes (Addendum)
Daily Rounding Note  12/31/2018, 9:07 AM  LOS: 23 days   SUBJECTIVE:   Chief complaint: ileus    T max to 102.9.      OBJECTIVE:         Vital signs in last 24 hours:    Temp:  [98.8 F (37.1 C)-102.9 F (39.4 C)] 98.8 F (37.1 C) (12/07 0754) Pulse Rate:  [101-144] 108 (12/07 0754) Resp:  [20-34] 20 (12/07 0754) BP: (103-147)/(54-86) 138/75 (12/07 0754) SpO2:  [99 %-100 %] 100 % (12/07 0754) FiO2 (%):  [30 %] 30 % (12/07 0400) Weight:  [138.2 kg] 138.2 kg (12/07 0500) Last BM Date: 12/31/18 Filed Weights   12/29/18 2327 12/30/18 0500 12/31/18 0500  Weight: (!) 137.3 kg (!) 137.3 kg (!) 138.2 kg   General: unresponsive to exam and speaking.    cortrack FT in place but tube feeds not running.   Heart: regular, tachy to 1teens.   Chest: ronchi.  NRB mask in place Abdomen: obese, large, soft, NT.  BS absent.  Watery orangey/tan liquid stool in flexseal.   Extremities: non-pititng LE edema Neuro/Psych:  Unresponsive.     Intake/Output from previous day: 12/06 0701 - 12/07 0700 In: 1110 [I.V.:1010; IV Piggyback:100] Out: 1900 [Urine:1400; Stool:500]  Intake/Output this shift: No intake/output data recorded.  Lab Results: Recent Labs    12/29/18 0953 12/30/18 0641 12/31/18 0648  WBC 13.0* 17.4* 19.3*  HGB 11.0* 11.2* 11.4*  HCT 34.2* 35.6* 37.0*  PLT 897* 947* 671*   BMET Recent Labs    12/29/18 0541 12/30/18 0641 12/31/18 0648  NA 134* 136 142  K 5.0 4.5 4.0  CL 96* 97* 104  CO2 '26 26 28  ' GLUCOSE 98 113* 96  BUN 13 21* 17  CREATININE 0.34* 0.49* 0.44*  CALCIUM 9.4 9.3 9.8   LFT No results for input(s): PROT, ALBUMIN, AST, ALT, ALKPHOS, BILITOT, BILIDIR, IBILI in the last 72 hours. PT/INR No results for input(s): LABPROT, INR in the last 72 hours. Hepatitis Panel No results for input(s): HEPBSAG, HCVAB, HEPAIGM, HEPBIGM in the last 72 hours.  Studies/Results: Dg Abd 1 View  Result  Date: 12/30/2018 CLINICAL DATA:  Reason for exam: Vomiting, aspiration into airway Hx HTN, pre-diabetes, fatty liver, pancreatitis, pneumonia EXAM: ABDOMEN - 1 VIEW COMPARISON:  12/25/2018 FINDINGS: Feeding tube tip in the distal duodenum. Stomach appears decompressed. Multiple dilated small bowel loops in the mid abdomen, increased since previous. Scattered gas in the nondilated colon. CLINICAL DATA:  Reason for exam: Vomiting, aspiration into airway Hx HTN, pre-diabetes, fatty liver, pancreatitis, pneumonia EXAM: ABDOMEN - 1 VIEW COMPARISON:  12/25/2018 FINDINGS: Feeding tube tip in the distal duodenum. Stomach appears decompressed. Multiple dilated small bowel loops in the mid abdomen, increased since previous. Scattered gas in the nondilated colon. IMPRESSION: 1. Multiple dilated small bowel loops in the mid abdomen, increased since previous, suggesting obstruction or developing ileus. 2. Feeding tube tip in the distal duodenum.  Stomach  decompressed. Electronically Signed   By: Lucrezia Europe M.D.   On: 12/30/2018 09:43   Dg Chest Port 1 View  Result Date: 12/31/2018 CLINICAL DATA:  23 year old male with a history aspiration EXAM: PORTABLE CHEST 1 VIEW COMPARISON:  12/30/2018, 12/28/2018 FINDINGS: Cardiomediastinal silhouette unchanged in size and contour. Low lung volumes persist with linear opacities at the lung bases. No pneumothorax or large pleural effusion. Crowding of the interstitium and the vasculature. Enteric tube projects over the mediastinum and terminates out  of the field of view. IMPRESSION: Unchanged appearance of the chest x-ray with low lung volumes and likely atelectasis. Unchanged enteric feeding tube. Electronically Signed   By: Corrie Mckusick D.O.   On: 12/31/2018 07:45   Dg Chest Port 1 View  Result Date: 12/30/2018 CLINICAL DATA:  Reason for exam: Vomiting, aspiration into airway Hx HTN, pre-diabetes, fatty liver, pancreatitis, pneumonia EXAM: PORTABLE CHEST - 1 VIEW COMPARISON:   12/28/2018 FINDINGS: Feeding tube remains in place. Persistent low lung volumes with coarse perihilar and bibasilar interstitial markings. No new infiltrate or edema. Heart size and mediastinal contours are within normal limits. No effusion. Visualized bones unremarkable. IMPRESSION: 1. Stable chest x-ray. No new infiltrate or edema. 2. Stable appearance of the cardiomediastinal silhouette. Electronically Signed   By: Lucrezia Europe M.D.   On: 12/30/2018 09:42   Dg Abd Portable 1v  Result Date: 12/30/2018 CLINICAL DATA:  Ileus EXAM: PORTABLE ABDOMEN - 1 VIEW COMPARISON:  Portable exam 1715 hours compared to 0906 hours FINDINGS: Tip of feeding tube projects over the third portion of duodenum approaching ligament of Treitz. Scattered gas throughout bowel, decreased from prior exam. No bowel dilatation or bowel wall thickening. Osseous structures unremarkable. No urinary tract calcifications. IMPRESSION: Nonspecific bowel gas pattern. Decreased gaseous distention of bowel since prior study. Electronically Signed   By: Lavonia Dana M.D.   On: 12/30/2018 19:20   [place] Ekg Site Rite  Result Date: 12/31/2018 If Site Rite image not attached, placement could not be confirmed due to current cardiac rhythm.  Scheduled Meds: . chlorhexidine gluconate (MEDLINE KIT)  15 mL Mouth Rinse BID  . Chlorhexidine Gluconate Cloth  6 each Topical Q0600  . cloBAZam  60 mg Per Tube BID  . enoxaparin (LOVENOX) injection  40 mg Subcutaneous Q12H  . feeding supplement (PRO-STAT SUGAR FREE 64)  60 mL Per Tube BID  . levothyroxine  25 mcg Per Tube Q0600  . lisinopril  40 mg Per NG tube Daily  . magnesium oxide  400 mg Per NG tube BID  . mouth rinse  15 mL Mouth Rinse q12n4p  . metoprolol tartrate  125 mg Per NG tube BID  . multivitamin  15 mL Per Tube Daily  . nystatin   Topical Daily  . pantoprazole sodium  40 mg Per Tube BID  . potassium chloride  40 mEq Per Tube Daily  . sodium chloride flush  10-40 mL Intracatheter Q12H  .  tamsulosin  0.4 mg Oral Daily  . thiamine injection  100 mg Intravenous Daily  . vitamin B-12  1,000 mcg Per Tube Daily   Continuous Infusions: . sodium chloride 50 mL/hr at 12/30/18 2148  . feeding supplement (VITAL 1.5 CAL) 55 mL/hr at 12/30/18 0525  . levETIRAcetam 1,000 mg (12/30/18 2122)   PRN Meds:.acetaminophen (TYLENOL) oral liquid 160 mg/5 mL, fentaNYL (SUBLIMAZE) injection, ibuprofen, ipratropium-albuterol, labetalol, lip balm, LORazepam, metoprolol tartrate, ondansetron (ZOFRAN) IV, sodium chloride flush, white petrolatum   ASSESMENT:   *   Ileus, vomiting, aspiration.  Intestinal dysmotility.   11/08/18 EGD: grade B esophagitis, inflammatory nodules at EGJ.  Protonix via tube in place.   11/12/18 Flex sig/disimpaction.  Treated constipation w aggressive bowel regimen. 11/22/18 Flex sig w disimpaction.  Solid stool throughout colon.  12/30/18 vomiting, aspiration in setting of acute hypoxia, unresponsiveness.  Xray w increase in dilated loops SB, feeding tube in distal SB.   Recent diarrhea, C diff negative 12/30/18.   Linzess 11/15.  Neostigmine 11/27 - 11/29.  E  mycin suspension11/25 - 12/6.  Mag oxide via tube in place.  Psyllium11/29 - 12/6.    KUB 5:30 PM 12/6: improved, non-specific BGP and decreased distention.  Feeding is prostat and Vital 1.5 ngt.     *  Severe autism.   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  Hospice care at home, subsequently revoked.   Per CCM D Argwala: "Has not been able to move out of the ICU for any length of time due to multifactorial weakness and neurological decline from a combination of malnutrition immobility neuromuscular weakness and his underlying cognitive disorder"  *   Aspiration.  CXR today w atx  *   Hypomagnesemia.  Resolved continues on bid mag oxide via tube.    *   Leukocytosis, accelerating  *   Fatty liver.  Elevated T bili and transaminases steadily improved 11/24 >> 11/28.   Fatty liver, no GB edema or stones, normal bile ducts on [place] and CT scans.    *   Anemia, normocytic.  Mild and stable.   *   Hypothyroidism.  TSH elevated to 10.3, normal free T4 on 10/28.       PLAN   *   Restart tube feeds at 1/2 usual rate and titrate to goal dose over next 24 hours??   *   ? Need for the BID mag ox via tube?  This is contributing to diarrhea.    For now reducing to 1 x daily but may be able to d/c or go with QOD if recurrent low mag.      Aaron Vega  12/31/2018, 9:07 AM Phone [phone]

## 2018-12-31 NOTE — Progress Notes (Signed)
Chest pt not performed this round. Patient and mom resting comfortably. Will continue next rounds.

## 2018-12-31 NOTE — Progress Notes (Signed)
Chest PT not performed this round. Sterile procedure begin done. Will start again next round.

## 2019-01-01 ENCOUNTER — Encounter (HOSPITAL_COMMUNITY): Payer: Self-pay | Admitting: Radiology

## 2019-01-01 LAB — CBC
HCT: 33.3 % — ABNORMAL LOW (ref 39.0–52.0)
Hemoglobin: 10 g/dL — ABNORMAL LOW (ref 13.0–17.0)
MCH: 28.2 pg (ref 26.0–34.0)
MCHC: 30 g/dL (ref 30.0–36.0)
MCV: 93.8 fL (ref 80.0–100.0)
Platelets: 460 10*3/uL — ABNORMAL HIGH (ref 150–400)
RBC: 3.55 MIL/uL — ABNORMAL LOW (ref 4.22–5.81)
RDW: 17.7 % — ABNORMAL HIGH (ref 11.5–15.5)
WBC: 9.8 10*3/uL (ref 4.0–10.5)
nRBC: 0 % (ref 0.0–0.2)

## 2019-01-01 LAB — BASIC METABOLIC PANEL
Anion gap: 12 (ref 5–15)
BUN: 12 mg/dL (ref 6–20)
CO2: 27 mmol/L (ref 22–32)
Calcium: 9.4 mg/dL (ref 8.9–10.3)
Chloride: 105 mmol/L (ref 98–111)
Creatinine, Ser: 0.4 mg/dL — ABNORMAL LOW (ref 0.61–1.24)
GFR calc Af Amer: >60 mL/min (ref >60)
GFR calc non Af Amer: >60 mL/min (ref >60)
Glucose, Bld: 97 mg/dL (ref 70–99)
Potassium: 3.4 mmol/L — ABNORMAL LOW (ref 3.5–5.1)
Sodium: 144 mmol/L (ref 135–145)

## 2019-01-01 LAB — MAGNESIUM: Magnesium: 2.2 mg/dL (ref 1.7–2.4)

## 2019-01-01 MED ORDER — ENOXAPARIN SODIUM 40 MG/0.4ML ~~LOC~~ SOLN
40.0000 mg | Freq: Two times a day (BID) | SUBCUTANEOUS | Status: DC
  Administered 2019-01-02: 40 mg via SUBCUTANEOUS
  Filled 2019-01-01: qty 0.4

## 2019-01-01 NOTE — Progress Notes (Signed)
Physical Therapy Treatment Patient Details Name: Aaron Vega MRN: [id number] DOB: Jun 18, 1995 Today's Date: 01/01/2019    History of Present Illness 23 y.o. male with medical history significant of autism (nonverbal), Tourettes, seizures, obesity, reflux esophagitis, hypertension constipation and sleep apnea. 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 Admitted for lethargy and vomiting. Found to have acute on chronic respiratory failure and intubated 11/27-11/30, ileus, HTN, tachycardia, aspiration, ascending weakness and encephalopathy.    PT Comments    Pt with decreased arousal today. Pt with periods of eye opening and visual tracking but difficult for pt to sustain. PT and OT with utilization of bed functions to come to chair position, with BP maintained with slow progression of HOB elevation, SCDs, and ace wraps x2 bilateral LEs. Pt required total +2 to come to long sitting, and unable to sustain without total +2.  PT performed PROM of LEs to maintain pt ROM, VSS except for RR up to 23 breaths/min with increased work of breathing noted. PT to continue to follow acutely.    Follow Up Recommendations  SNF(Family refusing. Maximize HHPT if discharges home.)     Equipment Recommendations  Hospital bed;Other (comment)(hoyer lift)    Recommendations for Other Services       Precautions / Restrictions Precautions Precautions: Fall;Other (comment) Precaution Comments: cortrak, rectal tube Restrictions Weight Bearing Restrictions: Yes RLE Weight Bearing: Non weight bearing LLE Weight Bearing: Non weight bearing    Mobility  Bed Mobility Overal bed mobility: Needs Assistance Bed Mobility: Supine to Sit Rolling: +2 for physical assistance;Total assist   Supine to sit: Total assist;+2 for physical assistance     General bed mobility comments: Use of bed functions to come to  upright sitting position, slowly increased HOB angle ~20-30* at a time with BP stable. total +2 total to long sit in the bed in attempt to help with increased arousal and help patient attempt to cough, pt in long sitting ~1 minute with total A +2 to maintain sitting.  Transfers Overall transfer level: (NT)               General transfer comment: not appropriate at this time  Ambulation/Gait                 Stairs             Wheelchair Mobility    Modified Rankin (Stroke Patients Only)       Balance Overall balance assessment: Needs assistance   Sitting balance-[name]: Zero Sitting balance - Comments: total A +2 to come to and maintain long sitting                                    Cognition Arousal/Alertness: Awake/alert Behavior During Therapy: Flat affect Overall Cognitive Status: Difficult to assess                                 General Comments: nonverbal, lift head to name call, less aroused than previous sesisons. pt with more of a focused attention during session. pt with less initiation toward offers of food by SLP. mother present and reports no attempts to reach for her today.       Exercises General Exercises - Lower Extremity Ankle Circles/Pumps: PROM;Both;10 reps;Supine Heel  Slides: PROM;Both;10 reps;Supine Other Exercises Other Exercises: PROM of bil UE: shoulder flexion, abduction, elbow flexion/ extension, wrist flexion/ extension digits    General Comments General comments (skin integrity, edema, etc.): HR 117  RR19 3L 100% Aaron Vega 114/56 (74) on arrival. with hob increased 64 degrees HR 118 97% RR23 110/68 (78) with SCD wrapped x2 ace wraps. prolonged position BP 104/64 (75) HR increased 125      Pertinent Vitals/Pain Pain Assessment: Faces Faces Pain Scale: Hurts a little bit Pain Location: generalized during mobility Pain Descriptors / Indicators: Discomfort;Grimacing Pain Intervention(s):  Monitored during session    Home Living                      Prior Function            PT Goals (current goals can now be found in the care plan section) Acute Rehab PT Goals Patient Stated Goal: home per family PT Goal Formulation: With family Time For Goal Achievement: 01/09/19 Potential to Achieve Goals: Poor Progress towards PT goals: Not progressing toward goals - comment(low arousal today)    Frequency    Min 2X/week      PT Plan Current plan remains appropriate    Co-evaluation PT/OT/SLP Co-Evaluation/Treatment: Yes Reason for Co-Treatment: Complexity of the patient's impairments (multi-system involvement);Necessary to address cognition/behavior during functional activity;For patient/therapist safety;To address functional/ADL transfers PT goals addressed during session: Mobility/safety with mobility;Balance OT goals addressed during session: ADL's and self-care;Proper use of Adaptive equipment and DME;Strengthening/ROM SLP goals addressed during session: Swallowing    AM-PAC PT "6 Clicks" Mobility   Outcome Measure  Help needed turning from your back to your side while in a flat bed without using bedrails?: Total Help needed moving from lying on your back to sitting on the side of a flat bed without using bedrails?: Total Help needed moving to and from a bed to a chair (including a wheelchair)?: Total Help needed standing up from a chair using your arms (e.g., wheelchair or bedside chair)?: Total Help needed to walk in hospital room?: Total Help needed climbing 3-5 steps with a railing? : Total 6 Click Score: 6    End of Session   Activity Tolerance: Patient limited by lethargy Patient left: in bed;with call bell/phone within reach;with family/visitor present;with SCD's reapplied Nurse Communication: Mobility status PT Visit Diagnosis: Other abnormalities of gait and mobility (R26.89);Muscle weakness (generalized) (M62.81)     Time: 6834-1962 PT  Time Calculation (min) (ACUTE ONLY): 32 min  Charges:  $Therapeutic Activity: 8-22 mins                     Aaron Vega E, PT Acute Rehabilitation Services Pager [phone]  Office [phone]    Aaron Vega Aaron Vega 01/01/2019, 1:47 PM

## 2019-01-01 NOTE — Progress Notes (Signed)
Chief Complaint: Patient was seen in consultation today for G-tube at the request of Dr. Dessa Phi  Referring Physician(s): Dr. Dessa Phi  Supervising Physician: Sandi Mariscal  Patient Status: [facility] - In-pt  History of Present Illness: Aaron Vega is a 23 y.o. male with complex medical hx and prolonged hospitalization. He has been treated for sepsis with resp failure as well as metabolic encephalopathy. Overall status not really changing. Though remains febrile but cultures returning negative and WBC down to normal today. Currently getting TF via NGT. IR is asked to place perc G-tube for longer term TF access. PMHx, meds, labs, imaging, allergies reviewed. Mother at bedside.     Past Medical History:  Diagnosis Date   Autistic disorder, current or active state    Fatty liver    Hypertension    Obesity    Pancreatitis    at age 37 years   Pneumonia    Pre-diabetes    Seizures ([place])    as of 11/23/15 - no seizures for 3 month   Sleep apnea    not on cpap (can't tolerate)   Tics of organic origin    Tourette syndrome     Past Surgical History:  Procedure Laterality Date   BIOPSY  11/08/2018   Procedure: BIOPSY;  Surgeon: Jerene Bears, MD;  Location: [facility];  Service: Gastroenterology;;   ESOPHAGOGASTRODUODENOSCOPY (EGD) WITH PROPOFOL N/A 11/08/2018   Procedure: ESOPHAGOGASTRODUODENOSCOPY (EGD) WITH PROPOFOL;  Surgeon: Jerene Bears, MD;  Location: [facility];  Service: Gastroenterology;  Laterality: N/A;   FLEXIBLE SIGMOIDOSCOPY N/A 11/21/2018   Procedure: FLEXIBLE SIGMOIDOSCOPY;  Surgeon: Rush Landmark Telford Nab., MD;  Location: [facility];  Service: Gastroenterology;  Laterality: N/A;   IMPACTION REMOVAL  11/21/2018   Procedure: IMPACTION REMOVAL;  Surgeon: Irving Copas., MD;  Location: [facility];  Service: Gastroenterology;;   RADIOLOGY WITH ANESTHESIA N/A 11/24/2015   Procedure: CT SCAN ABDOMEN AND PELVIS WITH  CONTRAST;  Surgeon: Medication Radiologist, MD;  Location: [place];  Service: Radiology;  Laterality: N/A;   TONSILLECTOMY     and adenoidectomy    Allergies: Patient has no known allergies.  Medications:  Current Facility-Administered Medications:    acetaminophen (TYLENOL) 160 MG/5ML solution 650 mg, 650 mg, Per Tube, Q6H PRN, [name], MD, 650 mg at 12/31/18 1954   Ampicillin-Sulbactam (UNASYN) 3 g in sodium chloride 0.9 % 100 mL IVPB, 3 g, Intravenous, Q6H, Dessa Phi, DO, Last Rate: 200 mL/hr at 01/01/19 0530, 3 g at 01/01/19 0530   chlorhexidine gluconate (MEDLINE KIT) (PERIDEX) 0.12 % solution 15 mL, 15 mL, Mouth Rinse, BID, Agarwala, Ravi, MD, 15 mL at 01/01/19 0800   Chlorhexidine Gluconate Cloth 2 % PADS 6 each, 6 each, Topical, Q0600, Agarwala, Ravi, MD, 6 each at 12/31/18 1000   cloBAZam (ONFI) tablet 60 mg, 60 mg, Per Tube, BID, Agarwala, Ravi, MD, 60 mg at 12/31/18 1957   enoxaparin (LOVENOX) injection 40 mg, 40 mg, Subcutaneous, Q12H, Agarwala, Ravi, MD, 40 mg at 12/31/18 2230   feeding supplement (PRO-STAT SUGAR FREE 64) liquid 60 mL, 60 mL, Per Tube, BID, Agarwala, Ravi, MD, 60 mL at 12/31/18 2230   feeding supplement (VITAL 1.5 CAL) liquid 1,000 mL, 1,000 mL, Per Tube, Continuous, Agarwala, Ravi, MD, Last Rate: 55 mL/hr at 12/30/18 0525   fentaNYL (SUBLIMAZE) injection 25 mcg, 25 mcg, Intravenous, Q2H PRN, Agarwala, Ravi, MD, 100 mcg at 12/21/18 1455   ibuprofen (ADVIL) 100 MG/5ML suspension 400 mg, 400 mg, Per Tube, Q6H PRN,  [name], MD, 400 mg at 12/31/18 1602   ipratropium-albuterol (DUONEB) 0.5-2.5 (3) MG/3ML nebulizer solution 3 mL, 3 mL, Nebulization, Q6H PRN, [name], MD, 3 mL at 12/26/18 0023   labetalol (NORMODYNE) injection 10 mg, 10 mg, Intravenous, Q2H PRN, [name], MD, 10 mg at 12/27/18 0920   levETIRAcetam (KEPPRA) IVPB 1000 mg/100 mL premix, 1,000 mg, Intravenous, Q12H, Agarwala, Einar Grad, MD, Last Rate: 400 mL/hr at  12/31/18 2315, 1,000 mg at 12/31/18 2315   levothyroxine (SYNTHROID) tablet 25 mcg, 25 mcg, Per Tube, Q0600, [name], MD, 25 mcg at 01/01/19 0526   lip balm (CARMEX) ointment, , Topical, PRN, Agarwala, Einar Grad, MD   lisinopril (ZESTRIL) tablet 40 mg, 40 mg, Per NG tube, Daily, Agarwala, Ravi, MD, 40 mg at 12/31/18 1051   LORazepam (ATIVAN) injection 1 mg, 1 mg, Intravenous, Q6H PRN, [name], MD, 1 mg at 12/23/18 0908   MEDLINE mouth rinse, 15 mL, Mouth Rinse, q12n4p, Agarwala, Ravi, MD, 15 mL at 12/31/18 1600   metoprolol tartrate (LOPRESSOR) 25 mg/10 mL oral suspension 125 mg, 125 mg, Per NG tube, BID, Agarwala, Ravi, MD, 125 mg at 12/31/18 1955   metoprolol tartrate (LOPRESSOR) injection 5 mg, 5 mg, Intravenous, Q6H PRN, [name], MD, 5 mg at 12/31/18 1553   multivitamin liquid 15 mL, 15 mL, Per Tube, Daily, Agarwala, Ravi, MD, 15 mL at 12/31/18 1048   nystatin (MYCOSTATIN/NYSTOP) topical powder, , Topical, Daily, Agarwala, Ravi, MD   ondansetron (ZOFRAN) injection 4 mg, 4 mg, Intravenous, Q6H PRN, [name], MD, 4 mg at 12/30/18 0429   pantoprazole sodium (PROTONIX) 40 mg/20 mL oral suspension 40 mg, 40 mg, Per Tube, BID, Agarwala, Einar Grad, MD, 40 mg at 12/31/18 2001   potassium chloride 20 MEQ/15ML (10%) solution 40 mEq, 40 mEq, Per Tube, Daily, Agarwala, Einar Grad, MD, 40 mEq at 12/31/18 1050   sodium chloride flush (NS) 0.9 % injection 10-40 mL, 10-40 mL, Intracatheter, Q12H, Agarwala, Ravi, MD, 10 mL at 12/31/18 2001   sodium chloride flush (NS) 0.9 % injection 10-40 mL, 10-40 mL, Intracatheter, PRN, Agarwala, Ravi, MD   sodium chloride flush (NS) 0.9 % injection 10-40 mL, 10-40 mL, Intracatheter, Q12H, Dessa Phi, DO, 10 mL at 12/31/18 2000   sodium chloride flush (NS) 0.9 % injection 10-40 mL, 10-40 mL, Intracatheter, PRN, Dessa Phi, DO   tamsulosin (FLOMAX) capsule 0.4 mg, 0.4 mg, Oral, Daily, Agarwala, Ravi, MD, 0.4 mg at 12/31/18 1050   thiamine  (B-1) injection 100 mg, 100 mg, Intravenous, Daily, Agarwala, Ravi, MD, 100 mg at 12/31/18 1301   vitamin B-12 (CYANOCOBALAMIN) tablet 1,000 mcg, 1,000 mcg, Per Tube, Daily, Agarwala, Ravi, MD, 1,000 mcg at 12/31/18 1050   white petrolatum (VASELINE) gel, , Topical, PRN, [name], MD, 0.2 application at 66/06/30 1512    Family History  Problem Relation Age of Onset   Seizures Maternal Aunt    Anxiety disorder Paternal Uncle    Depression Paternal Uncle    Bipolar disorder Paternal Uncle    Seizures Cousin        Maternal 1st and 2nd cousins have seizures   Anxiety disorder Other        Paternal fhx   Depression Other        Paternal fhx   Hypertension Other        Paternal fhx   Bipolar disorder Other        Paternal fhx    Social History   Socioeconomic History   Marital status: Single  Spouse name: Not on file   Number of children: Not on file   Years of education: Not on file   Highest education level: Not on file  Occupational History   Not on file  Social Needs   Financial resource strain: Not on file   Food insecurity    Worry: Not on file    Inability: Not on file   Transportation needs    Medical: Not on file    Non-medical: Not on file  Tobacco Use   Smoking status: Never Smoker   Smokeless tobacco: Never Used  Substance and Sexual Activity   Alcohol use: Never    Frequency: Never   Drug use: Never   Sexual activity: Not on file  Lifestyle   Physical activity    Days per week: Not on file    Minutes per session: Not on file   Stress: Not on file  Relationships   Social connections    Talks on phone: Not on file    Gets together: Not on file    Attends religious service: Not on file    Active member of club or organization: Not on file    Attends meetings of clubs or organizations: Not on file    Relationship status: Not on file  Other Topics Concern   Not on file  Social History Narrative   Not on file     Review of Systems: A 12 point ROS discussed and pertinent positives are indicated in the HPI above.  All other systems are negative.  Review of Systems  Vital Signs: BP 111/77 (BP Location: Left Arm)    Pulse (!) 102    Temp 98.1 F (36.7 C)    Resp (!) 25    Ht '5\' 11"'$  (1.803 m)    Wt (!) 140.3 kg    SpO2 91%    BMI 43.14 kg/m   Physical Exam Constitutional:      General: He is not in acute distress.    Appearance: He is obese. He is ill-appearing.  HENT:     Mouth/Throat:     Mouth: Mucous membranes are moist.     Pharynx: Oropharynx is clear.  Cardiovascular:     Rate and Rhythm: Normal rate and regular rhythm.     Heart sounds: Normal heart sounds.  Pulmonary:     Effort: Pulmonary effort is normal. No respiratory distress.     Breath sounds: Normal breath sounds.  Abdominal:     General: Abdomen is flat. There is distension.     Palpations: Abdomen is soft.     Tenderness: There is no abdominal tenderness.  Skin:    General: Skin is warm and dry.  Neurological:     Mental Status: He is disoriented.     Imaging: Dg Abd 1 View  Result Date: 12/30/2018 CLINICAL DATA:  Reason for exam: Vomiting, aspiration into airway Hx HTN, pre-diabetes, fatty liver, pancreatitis, pneumonia EXAM: ABDOMEN - 1 VIEW COMPARISON:  12/25/2018 FINDINGS: Feeding tube tip in the distal duodenum. Stomach appears decompressed. Multiple dilated small bowel loops in the mid abdomen, increased since previous. Scattered gas in the nondilated colon. CLINICAL DATA:  Reason for exam: Vomiting, aspiration into airway Hx HTN, pre-diabetes, fatty liver, pancreatitis, pneumonia EXAM: ABDOMEN - 1 VIEW COMPARISON:  12/25/2018 FINDINGS: Feeding tube tip in the distal duodenum. Stomach appears decompressed. Multiple dilated small bowel loops in the mid abdomen, increased since previous. Scattered gas in the nondilated colon. IMPRESSION: 1. Multiple dilated small  bowel loops in the mid abdomen, increased since  previous, suggesting obstruction or developing ileus. 2. Feeding tube tip in the distal duodenum.  Stomach  decompressed. Electronically Signed   By: Lucrezia Europe M.D.   On: 12/30/2018 09:43   Dg Abd 1 View  Result Date: 12/24/2018 CLINICAL DATA:  Nasogastric tube placement. EXAM: ABDOMEN - 1 VIEW COMPARISON:  December 21, 2018. FINDINGS: The bowel gas pattern is normal. Nasogastric tube tip is seen in expected position of third portion of duodenum. No radio-opaque calculi or other significant radiographic abnormality are seen. IMPRESSION: Nasogastric tube tip seen in expected position of third portion of duodenum. No evidence of bowel obstruction or ileus. Electronically Signed   By: Marijo Conception M.D.   On: 12/24/2018 14:39   Dg Abd 1 View  Result Date: 12/16/2018 CLINICAL DATA:  Ileus. EXAM: ABDOMEN - 1 VIEW COMPARISON:  None. FINDINGS: There is a dilated loop of small bowel in the mid abdomen measuring approximately 3.6 cm. There is no obvious pneumatosis or free air. The patient's enteric tube is outside the field of view. IMPRESSION: Mildly dilated loop of small bowel in the mid abdomen is nonspecific and could represent an ileus or partial small bowel obstruction the appropriate clinical setting. Electronically Signed   By: Constance Holster M.D.   On: 12/16/2018 15:04   Dg Abd 1 View  Result Date: 12/14/2018 CLINICAL DATA:  Nasogastric tube advancement. EXAM: ABDOMEN - 1 VIEW COMPARISON:  One view abdomen 12/12/2018 and 12/10/2018. CT 11/29/2018. FINDINGS: 0936 hours. Two views are submitted. The tip of the endotracheal tube is unchanged, overlying the mid stomach. The stomach and colon are mildly distended with gas. No significant small bowel distention identified. There is no evidence of free intraperitoneal air. Atelectasis is present at both lung bases. IMPRESSION: Stable location of the nasogastric tube. No evidence of bowel obstruction. Electronically Signed   By: Richardean Sale M.D.    On: 12/14/2018 09:53   Dg Abd 1 View  Result Date: 12/10/2018 CLINICAL DATA:  23 year old male status post NG tube placement. EXAM: ABDOMEN - 1 VIEW COMPARISON:  Abdominal radiograph dated 12/10/2018. FINDINGS: Partially visualized enteric tube with tip and side-port over the gastric air. The osseous structures and soft tissues appear unremarkable. IMPRESSION: Enteric tube with tip and side-port over the gastric air. Electronically Signed   By: Anner Crete M.D.   On: 12/10/2018 14:41   Ct Head Wo Contrast  Result Date: 12/17/2018 CLINICAL DATA:  Altered level of consciousness EXAM: CT HEAD WITHOUT CONTRAST TECHNIQUE: Contiguous axial images were obtained from the base of the skull through the vertex without intravenous contrast. COMPARISON:  11/03/2018 FINDINGS: Brain: The brainstem, cerebellum, cerebral peduncles, thalami, basal ganglia, basilar cisterns, and ventricular system appear within normal limits. No intracranial hemorrhage, mass lesion, or acute CVA. Vascular: Unremarkable Skull: Unremarkable Sinuses/Orbits: Mild chronic right frontal sinusitis. Other: No supplemental non-categorized findings. IMPRESSION: 1. No significant intracranial abnormality is observed. 2. Mild chronic right frontal sinusitis. Electronically Signed   By: Van Clines M.D.   On: 12/03/2018 19:02   Ct Thoracic Spine Wo Contrast  Result Date: 12/14/2018 CLINICAL DATA:  Myelopathy, acute or progressive EXAM: CT THORACIC SPINE WITHOUT CONTRAST TECHNIQUE: Multidetector CT images of the thoracic were obtained using the standard protocol without intravenous contrast. COMPARISON:  None. FINDINGS: Alignment: Normal Vertebrae: Normal vertebra.  No fracture or mass. Paraspinal and other soft tissues: Negative for paraspinous mass or adenopathy. NG tube in place Left lower lobe small  infiltrate most likely pneumonia. Fatty liver with hepatomegaly. Disc levels: Disc spaces are well preserved. No significant disc  degeneration or spurring. Negative for spinal stenosis. IMPRESSION: Negative CT thoracic spine Left lower lobe infiltrate, probable pneumonia Electronically Signed   By: Franchot Gallo M.D.   On: 12/14/2018 20:48   Ct Lumbar Spine Wo Contrast  Result Date: 12/14/2018 CLINICAL DATA:  Myelopathy, acute or progressive. EXAM: CT LUMBAR SPINE WITHOUT CONTRAST TECHNIQUE: Multidetector CT imaging of the lumbar spine was performed without intravenous contrast administration. Multiplanar CT image reconstructions were also generated. COMPARISON:  None. FINDINGS: Segmentation: Normal Alignment: Normal Vertebrae: Negative for fracture or mass.  Normal vertebra. Paraspinal and other soft tissues: Negative for paraspinous mass or adenopathy. No soft tissue edema. Disc levels: Disc spaces maintained. No significant disc degeneration or spinal stenosis. IMPRESSION: Negative CT lumbar spine. Electronically Signed   By: Franchot Gallo M.D.   On: 12/14/2018 20:51   Mr Jeri Cos OZ Contrast  Result Date: 12/10/2018 CLINICAL DATA:  Slowly progressive ataxia.  History of seizures. EXAM: MRI HEAD WITHOUT AND WITH CONTRAST TECHNIQUE: Multiplanar, multiecho pulse sequences of the brain and surrounding structures were obtained without and with intravenous contrast. CONTRAST:  48m GADAVIST GADOBUTROL 1 MMOL/ML IV SOLN COMPARISON:  Head CT 11/25/2018 FINDINGS: Brain: There is considerable motion degradation. There is abnormal T2 signal and restricted diffusion in both medial thalami. The most likely explanation is Wernicke encephalopathy. The remainder of the brain appears normal. No evidence of ischemic infarction, mass lesion, hemorrhage, hydrocephalus or extra-axial collection. No abnormal contrast enhancement is demonstrated. Vascular: Major vessels at the base of the brain show flow. Skull and upper cervical spine: Negative Sinuses/Orbits: Clear/normal Other: None IMPRESSION: Abnormal T2 signal and restricted diffusion in both  medial thalami. 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 Electronically Signed   By: MNelson ChimesM.D.   On: 12/10/2018 17:16   Mr Cervical Spine W Wo Contrast  Result Date: 12/10/2018 CLINICAL DATA:  Slowly progressive ataxia and seizures. EXAM: MRI CERVICAL SPINE WITHOUT AND WITH CONTRAST TECHNIQUE: Multiplanar and multiecho pulse sequences of the cervical spine, to include the craniocervical junction and cervicothoracic junction, were obtained without and with intravenous contrast. CONTRAST:  171mGADAVIST GADOBUTROL 1 MMOL/ML IV SOLN COMPARISON:  None. FINDINGS: The study suffers from considerable motion degradation. Alignment: Normal Vertebrae: Normal Cord: Normal Posterior Fossa, vertebral arteries, paraspinal tissues: See results of brain MRI. Paraspinal regions negative. Disc levels: Normal IMPRESSION: Motion degraded exam.  No abnormality seen in the cervical region. Electronically Signed   By: MaNelson Chimes.D.   On: 12/10/2018 17:20   Mr Thoracic Spine W Wo Contrast  Result Date: 12/19/2018 CLINICAL DATA:  Quadriplegia EXAM: MRI THORACIC AND LUMBAR SPINE WITHOUT AND WITH CONTRAST TECHNIQUE: Multiplanar and multiecho pulse sequences of the thoracic and lumbar spine were obtained without and with intravenous contrast. CONTRAST:  1062mADAVIST GADOBUTROL 1 MMOL/ML IV SOLN COMPARISON:  CT thoracic lumbar 12/14/2018 FINDINGS: MRI THORACIC SPINE FINDINGS Alignment:  Normal. Image quality degraded by moderate to extensive motion. Vertebrae: Negative for fracture or mass. Normal enhancement of the spine. Cord: Limited cord evaluation due to motion. No cord lesion or cord compression identified. Paraspinal and other soft tissues: Negative Disc levels: Negative for disc protrusion or spinal stenosis. MRI LUMBAR SPINE FINDINGS Segmentation:  Normal Motion degraded study. There is progressive motion on the study which becomes more severe on the axial images. Alignment:  Normal Vertebrae:  Negative  for fracture or mass. Conus medullaris and  cauda equina: Conus extends to the T12-L1 level. Conus and cauda equina appear normal. Paraspinal and other soft tissues: Negative for paraspinous mass or adenopathy. Normal soft tissue enhancement. Disc levels: No significant abnormality at L3-4 or above Mild disc bulging and mild facet degeneration L4-5 and L5-S1. Negative for disc protrusion or stenosis IMPRESSION: Motion degraded study, worse on the thoracic compared to the lumbar spine. No acute thoracic abnormality. Negative for fracture, infection, or spinal stenosis. No acute lumbar abnormality. Mild disc and facet degeneration L4-5 and L5-S1 without stenosis. Electronically Signed   By: Franchot Gallo M.D.   On: 12/19/2018 12:05   Mr Lumbar Spine W Wo Contrast  Result Date: 12/19/2018 CLINICAL DATA:  Quadriplegia EXAM: MRI THORACIC AND LUMBAR SPINE WITHOUT AND WITH CONTRAST TECHNIQUE: Multiplanar and multiecho pulse sequences of the thoracic and lumbar spine were obtained without and with intravenous contrast. CONTRAST:  2m GADAVIST GADOBUTROL 1 MMOL/ML IV SOLN COMPARISON:  CT thoracic lumbar 12/14/2018 FINDINGS: MRI THORACIC SPINE FINDINGS Alignment:  Normal. Image quality degraded by moderate to extensive motion. Vertebrae: Negative for fracture or mass. Normal enhancement of the spine. Cord: Limited cord evaluation due to motion. No cord lesion or cord compression identified. Paraspinal and other soft tissues: Negative Disc levels: Negative for disc protrusion or spinal stenosis. MRI LUMBAR SPINE FINDINGS Segmentation:  Normal Motion degraded study. There is progressive motion on the study which becomes more severe on the axial images. Alignment:  Normal Vertebrae:  Negative for fracture or mass. Conus medullaris and cauda equina: Conus extends to the T12-L1 level. Conus and cauda equina appear normal. Paraspinal and other soft tissues: Negative for paraspinous mass or adenopathy. Normal soft tissue  enhancement. Disc levels: No significant abnormality at L3-4 or above Mild disc bulging and mild facet degeneration L4-5 and L5-S1. Negative for disc protrusion or stenosis IMPRESSION: Motion degraded study, worse on the thoracic compared to the lumbar spine. No acute thoracic abnormality. Negative for fracture, infection, or spinal stenosis. No acute lumbar abnormality. Mild disc and facet degeneration L4-5 and L5-S1 without stenosis. Electronically Signed   By: CFranchot GalloM.D.   On: 12/19/2018 12:05   Ct Abdomen Pelvis W Contrast  Result Date: 12/22/2018 CLINICAL DATA:  Persistent ileus. EXAM: CT ABDOMEN AND PELVIS WITH CONTRAST TECHNIQUE: Multidetector CT imaging of the abdomen and pelvis was performed using the standard protocol following bolus administration of intravenous contrast. CONTRAST:  1055mOMNIPAQUE IOHEXOL 300 MG/ML  SOLN COMPARISON:  12/10/2018 FINDINGS: Lower chest: Normal heart size. Airspace disease in both lower lungs, most confluent in the medial left lower lobe. Hepatobiliary: Hepatic steatosis which is diffuse and prominent.No evidence of biliary obstruction or stone. Pancreas: Unremarkable. Spleen: Unremarkable. Adrenals/Urinary Tract: Negative adrenals. No hydronephrosis or stone. Bladder is decompressed by Foley catheter. Stomach/Bowel: Fluid levels seen throughout small and large bowel with moderate distention of bowel loops. A rectal tube is in place. Maximal colonic thickening is at the ascending segment-8.4 cm. No bowel wall thickening. New high-density within the appendix attributed to barium retention. There is mild widening of the midportion appendix that is stable, no periappendiceal inflammation. Enteric tube tip in good position at the proximal stomach. Vascular/Lymphatic: No acute vascular abnormality. No mass or adenopathy. Reproductive:Negative Other: No ascites or pneumoperitoneum. Musculoskeletal: No acute abnormalities. IMPRESSION: 1. Adynamic ileus pattern that  has progressed from 12/06/2018. 2. Hepatic steatosis. 3. Pneumonia both lung bases, progressed from comparison. Electronically Signed   By: JoMonte Fantasia.D.   On: 12/22/2018 15:42   Ct  Abdomen Pelvis W Contrast  Result Date: 12/03/2018 CLINICAL DATA:  Abdominal pain. Shortness of breath. EXAM: CT ABDOMEN AND PELVIS WITH CONTRAST TECHNIQUE: Multidetector CT imaging of the abdomen and pelvis was performed using the standard protocol following bolus administration of intravenous contrast. CONTRAST:  117m OMNIPAQUE IOHEXOL 350 MG/ML SOLN COMPARISON:  11/24/2018 FINDINGS: Lower chest: New airspace opacity in the left lower lobe suspicious for pneumonia. Air fluid level in the distal esophagus probably from reflux. Hepatobiliary: Diffuse hepatic steatosis is suspected. Otherwise unremarkable. Pancreas: Unremarkable Spleen: Unremarkable Adrenals/Urinary Tract: Unremarkable Stomach/Bowel: There are a few mildly dilated loops of small bowel measuring up to 3.5 cm in diameter, with scattered air-fluid levels but without an obvious transition point or site of obstruction. Vascular/Lymphatic: Borderline prominent right gastric node 0.9 cm in short axis on image 24/3, previously 0.8 cm. Patient has an unusually small in caliber abdominal aorta, measuring 0.9 cm in diameter. Common origin of the celiac trunk and SMA. No obvious periaortic inflammatory findings. Reproductive: Unremarkable Other: No supplemental non-categorized findings. Musculoskeletal: Unremarkable IMPRESSION: 1. New airspace opacity in the left lower lobe suspicious for pneumonia. 2. There are a few mildly dilated loops of small bowel with scattered air-fluid levels but without an obvious transition point or site of obstruction. Ileus is favored over low-grade partial small bowel obstruction. 3. Diffuse hepatic steatosis. 4. Air fluid level in the distal esophagus probably from reflux. 5. Small caliber abdominal aorta, only about 0.9 cm in diameter,  without periaortic inflammatory findings. 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 Consider follow up vascular surgical consultation. Electronically Signed   By: WVan ClinesM.D.   On: 12/16/2018 19:15   Dg Chest Port 1 View  Result Date: 12/31/2018 CLINICAL DATA:  23year old male with a history aspiration EXAM: PORTABLE CHEST 1 VIEW COMPARISON:  12/30/2018, 12/28/2018 FINDINGS: Cardiomediastinal silhouette unchanged in size and contour. Low lung volumes persist with linear opacities at the lung bases. No pneumothorax or large pleural effusion. Crowding of the interstitium and the vasculature. Enteric tube projects over the mediastinum and terminates out of the field of view. IMPRESSION: Unchanged appearance of the chest x-ray with low lung volumes and likely atelectasis. Unchanged enteric feeding tube. Electronically Signed   By: JCorrie MckusickD.O.   On: 12/31/2018 07:45   Dg Chest Port 1 View  Result Date: 12/30/2018 CLINICAL DATA:  Reason for exam: Vomiting, aspiration into airway Hx HTN, pre-diabetes, fatty liver, pancreatitis, pneumonia EXAM: PORTABLE CHEST - 1 VIEW COMPARISON:  12/28/2018 FINDINGS: Feeding tube remains in place. Persistent low lung volumes with coarse perihilar and bibasilar interstitial markings. No new infiltrate or edema. Heart size and mediastinal contours are within normal limits. No effusion. Visualized bones unremarkable. IMPRESSION: 1. Stable chest x-ray. No new infiltrate or edema. 2. Stable appearance of the cardiomediastinal silhouette. Electronically Signed   By: DLucrezia EuropeM.D.   On: 12/30/2018 09:42   Dg Chest Port 1 View  Result Date: 12/28/2018 CLINICAL DATA:  Updated status.  Fever. EXAM: PORTABLE CHEST 1 VIEW COMPARISON:  December 26, 2018 FINDINGS: The feeding tube terminates below today's film. Mild opacity in left base is somewhat streaky in platelike laterally. Low lung volumes. No pneumothorax. The lungs are otherwise clear. The  cardiomediastinal silhouette is stable. IMPRESSION: Streaky opacity in left base is favored to represent atelectasis given the platelike configuration laterally. Developing infiltrate not completely excluded. Recommend attention on follow-up. 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 EXAM: PORTABLE CHEST 1 VIEW COMPARISON:  Portable chest 12/21/2018. FINDINGS: Portable AP semi upright view at 0052 hours. Extubated and NG type tube removed since 12/21/2018. Lower lung volumes. Mediastinal contours remain normal. There is an enteric feeding tube in place which courses to the abdomen and probably crosses midline to the right but the tip is not included. Increased crowding of lung markings and/or pulmonary vascularity without overt edema. No pneumothorax, pleural effusion or consolidation. Stable visible bowel gas pattern. IMPRESSION: 1. Extubated with lower lung volumes. No other acute cardiopulmonary abnormality. 2. Enteric feeding tube courses to the abdomen, tip not included. Electronically Signed   By: Genevie Ann M.D.   On: 12/26/2018 01:08   Dg Chest Port 1 View  Result Date: 12/21/2018 CLINICAL DATA:  24 year old male with history of central line and nasogastric tube placement. EXAM: PORTABLE CHEST 1 VIEW COMPARISON:  Chest x-ray 12/21/2018. FINDINGS: An endotracheal tube is in place with tip 4.1 cm above the carina. There is a right-sided internal jugular central venous catheter with tip terminating in the distal superior vena cava. Nasogastric tube extending into the proximal stomach with side port just distal to the gastroesophageal junction. No pneumothorax. Lung volumes are low. Ill-defined opacities in the medial aspect of the left lung base and medial aspect of the right upper lobe. No pleural effusions. No evidence of pulmonary edema.  Heart size is normal. IMPRESSION: 1. Support apparatus, as above. 2. Ill-defined opacities in the medial left lower lobe and medial right upper lobe concerning for multilobar pneumonia. Electronically Signed   By: Vinnie Langton M.D.   On: 12/21/2018 17:22   Dg Chest Port 1 View  Result Date: 12/21/2018 CLINICAL DATA:  Aspiration, possible seizure EXAM: PORTABLE CHEST 1 VIEW COMPARISON:  12/20/2018 FINDINGS: No significant change in low volume AP portable examination without significant acute appearing airspace opacity. Esophagogastric tube remains with tip and side port below the diaphragm. Heart and mediastinum are unremarkable. IMPRESSION: No significant change in low volume portable chest x-ray. No new findings. Electronically Signed   By: Eddie Candle M.D.   On: 12/21/2018 11:32   Dg Chest Port 1 View  Result Date: 12/20/2018 CLINICAL DATA:  Fever. History of hypertension seizures and pancreatitis. EXAM: PORTABLE CHEST 1 VIEW COMPARISON:  12/16/2018 and older exams. FINDINGS: Lung volumes are low. Atelectasis in the medial lung bases. Lungs otherwise clear. No pleural effusion or pneumothorax. Cardiac silhouette normal in size.  No mediastinal or hilar masses. Nasogastric tube passes below the diaphragm well into the stomach. IMPRESSION: 1. No acute cardiopulmonary disease. Electronically Signed   By: Lajean Manes M.D.   On: 12/20/2018 13:39   Dg Chest Port 1 View  Result Date: 12/16/2018 CLINICAL DATA:  Shortness of breath. EXAM: PORTABLE CHEST 1 VIEW COMPARISON:  December 15, 2018. FINDINGS: Again noted are low lung volumes with bibasilar atelectasis. The cardiac silhouette remains enlarged. There is no pneumothorax. No large pleural effusion. No acute osseous abnormality. An enteric tube is noted. IMPRESSION: Stable appearance of the chest. Electronically Signed   By: Constance Holster M.D.   On: 12/16/2018 15:02   Dg Chest Port 1 View  Result Date: 12/15/2018 CLINICAL DATA:   Respiratory failure. EXAM: PORTABLE CHEST 1 VIEW COMPARISON:  12/14/2018 FINDINGS: 0514 hours. Low lung volumes. Interval improvement in left basilar aeration. The lungs are clear without focal pneumonia, edema, pneumothorax  or pleural effusion. Cardiopericardial silhouette accentuated by low volume AP technique. NG tube tip is in the mid stomach. Telemetry leads overlie the chest. IMPRESSION: Low volume film without acute cardiopulmonary findings. Electronically Signed   By: Misty Stanley M.D.   On: 12/15/2018 07:37   Dg Chest Port 1 View  Result Date: 12/14/2018 CLINICAL DATA:  Shortness of breath. EXAM: PORTABLE CHEST 1 VIEW COMPARISON:  Chest radiograph 12/10/2018, abdominal radiograph 12/12/2018 FINDINGS: Enteric tube is been retracted, tip now in the distal esophagus, side-port in the mid esophagus. Low lung volumes similar to prior exam. Slight increasing patchy opacities at the left lung base. Unchanged heart size and mediastinal contours. No pleural fluid or pneumothorax. IMPRESSION: 1. Enteric tube has been retracted, tip now in the distal esophagus, side-port in the mid esophagus. Recommend advancement of least 10 cm place the side-port below the diaphragm. 2. Persistent low lung volumes. Increasing patchy opacities at the left lung base, may be atelectasis or pneumonia. Electronically Signed   By: Keith Rake M.D.   On: 12/14/2018 05:22   Dg Chest Port 1 View  Result Date: 12/10/2018 CLINICAL DATA:  Dyspnea. EXAM: PORTABLE CHEST 1 VIEW COMPARISON:  December 09, 2018. FINDINGS: The heart size and mediastinal contours are within normal limits. No pneumothorax or pleural effusion is noted. Hypoinflation of the lungs is noted with minimal bibasilar subsegmental atelectasis. The visualized skeletal structures are unremarkable. IMPRESSION: Hypoinflation of the lungs with minimal bibasilar subsegmental atelectasis. Electronically Signed   By: Marijo Conception M.D.   On: 12/10/2018 09:17   Dg  Chest Port 1 View  Result Date: 12/09/2018 CLINICAL DATA:  Tachypnea, low-grade fever EXAM: PORTABLE CHEST 1 VIEW COMPARISON:  12/11/2018 FINDINGS: Low lung volumes. Heart size is accentuated by the low volumes. Bibasilar atelectasis. No effusions. No acute bony abnormality. IMPRESSION: Low lung volumes, bibasilar atelectasis. Electronically Signed   By: Rolm Baptise M.D.   On: 12/09/2018 21:38   Dg Chest Port 1 View  Result Date: 12/05/2018 CLINICAL DATA:  Sepsis. EXAM: PORTABLE CHEST 1 VIEW COMPARISON:  11/21/2018 FINDINGS: A very poor inspiration is again demonstrated. The heart remains grossly normal in size. Interval minimal patchy and linear density in the left lower lobe. Clear right lung. Mild peribronchial thickening. Unremarkable bones. IMPRESSION: 1. Interval minimal patchy and linear atelectasis or pneumonia in the left lower lobe. 2. Mild bronchitic changes. 3. Stable very poor inspiration. Electronically Signed   By: Claudie Revering M.D.   On: 11/28/2018 15:47   Dg Abd Portable 1v  Result Date: 12/30/2018 CLINICAL DATA:  Ileus EXAM: PORTABLE ABDOMEN - 1 VIEW COMPARISON:  Portable exam 1715 hours compared to 0906 hours FINDINGS: Tip of feeding tube projects over the third portion of duodenum approaching ligament of Treitz. Scattered gas throughout bowel, decreased from prior exam. No bowel dilatation or bowel wall thickening. Osseous structures unremarkable. No urinary tract calcifications. IMPRESSION: Nonspecific bowel gas pattern. Decreased gaseous distention of bowel since prior study. Electronically Signed   By: Lavonia Dana M.D.   On: 12/30/2018 19:20   Dg Abd Portable 1v  Result Date: 12/25/2018 CLINICAL DATA:  Feeding tube placement. EXAM: PORTABLE ABDOMEN - 1 VIEW COMPARISON:  December 24, 2018. FINDINGS: The bowel gas pattern is normal. Distal tip of feeding tube is seen in expected position of proximal duodenum. No radio-opaque calculi or other significant radiographic abnormality  are seen. IMPRESSION: Negative. Electronically Signed   By: Marijo Conception M.D.   On: 12/25/2018 16:06  Dg Abd Portable 1v  Result Date: 12/21/2018 CLINICAL DATA:  Nasogastric tube placement. EXAM: PORTABLE ABDOMEN - 1 VIEW COMPARISON:  December 20, 2018. FINDINGS: Mildly dilated air-filled large and small bowel loops are noted which may represent ileus. Distal tip of nasogastric tube is seen in proximal stomach. IMPRESSION: Distal tip of nasogastric tube seen in proximal stomach. Mildly dilated air-filled large and small bowel loops are noted which may represent ileus. Electronically Signed   By: Marijo Conception M.D.   On: 12/21/2018 17:22   Dg Abd Portable 1v  Result Date: 12/20/2018 CLINICAL DATA:  NG placement EXAM: PORTABLE ABDOMEN - 1 VIEW COMPARISON:  Radiograph 12/20/2018 FINDINGS: Transesophageal tube tip and side port distal to the GE junction, curling in the left upper quadrant in the region of the gastric body. Atelectatic changes in the otherwise clear lung bases. Gaseous distention of what appears to be the colon without high-grade bowel obstruction. IMPRESSION: 1. Transesophageal tube tip and side port in the region of the gastric body. 2. Gaseous distention of the colon without high-grade bowel obstruction. Electronically Signed   By: Lovena Le M.D.   On: 12/20/2018 02:45   Dg Abd Portable 1v  Result Date: 12/20/2018 CLINICAL DATA:  Check gastric catheter placement EXAM: PORTABLE ABDOMEN - 1 VIEW COMPARISON:  12/20/2018 FINDINGS: Gastric catheter has been advanced with the tip in the stomach. Proximal side port lies at the gastroesophageal junction. This could be advanced several cm. The lungs are clear. IMPRESSION: Gastric catheter as described. The proximal side port is not within the stomach and should be advanced several cm. Electronically Signed   By: Inez Catalina M.D.   On: 12/20/2018 01:50   Dg Abd Portable 1v  Result Date: 12/20/2018 CLINICAL DATA:  NG tube placement  EXAM: PORTABLE ABDOMEN - 1 VIEW COMPARISON:  12/16/2018 abdominal radiograph FINDINGS: The side port of the NG tube is in the distal esophagus. Recommend advancing by 7-10 cm. Lungs are clear. Nonobstructive bowel gas pattern. IMPRESSION: 1. Side-port of the nasogastric tube in the distal esophagus. Recommend advancing by 7-10 cm. 2. No active cardiopulmonary disease. Electronically Signed   By: Ulyses Jarred M.D.   On: 12/20/2018 00:41   Dg Abd Portable 1v  Result Date: 12/12/2018 CLINICAL DATA:  NG tube placement. EXAM: PORTABLE ABDOMEN - 1 VIEW COMPARISON:  Radiograph dated 12/10/2018 FINDINGS: NG tube has been inserted and the tip is in the midbody of the stomach. The visualized bowel gas pattern is normal. No visible acute bone abnormality. IMPRESSION: NG tube in the body of the stomach. Electronically Signed   By: Lorriane Shire M.D.   On: 12/12/2018 21:18   Dg Abd Portable 1v  Result Date: 12/10/2018 CLINICAL DATA:  Dyspnea, abdominal pain and distention EXAM: PORTABLE ABDOMEN - 1 VIEW COMPARISON:  12/12/2018 abdominal radiograph FINDINGS: There are mildly dilated small bowel loops throughout the abdomen measuring up to 3.9 cm diameter in the left abdomen, mildly decreased in caliber. No evidence of pneumatosis or pneumoperitoneum. No radiopaque nephrolithiasis. IMPRESSION: Diffuse mild small bowel dilatation, mildly decreased, which could represent either improving adynamic ileus or improving partial distal small bowel obstruction. Electronically Signed   By: Ilona Sorrel M.D.   On: 12/10/2018 09:19   Dg Abd Portable 1v  Result Date: 12/09/2018 CLINICAL DATA:  Nasogastric tube placement. EXAM: PORTABLE ABDOMEN - 1 VIEW COMPARISON:  12/10/2018 FINDINGS: No nasogastric tube seen in the lower chest or upper abdomen. Multiple dilated small bowel loops with mild improvement. Gas  is again demonstrated in normal caliber colon. Mild scoliosis. IMPRESSION: 1. No nasogastric tube seen in the lower chest  or upper abdomen. 2. Mildly improved small bowel obstruction. Electronically Signed   By: Claudie Revering M.D.   On: 12/09/2018 11:40   Dg Abd Portable 1v  Result Date: 12/12/2018 CLINICAL DATA:  Sepsis. Fever. EXAM: PORTABLE ABDOMEN - 1 VIEW COMPARISON:  11/23/2018 and abdomen and pelvis CT dated 11/24/2018. FINDINGS: Interval multiple mildly dilated loops of proximal small bowel with gas-filled normal caliber distal small bowel and colon. Unremarkable bones. IMPRESSION: Interval mild small bowel ileus or partial obstruction. No gross free air seen at this time. Electronically Signed   By: Claudie Revering M.D.   On: 12/15/2018 15:49   Vas [place] Lower Extremity Venous (dvt)  Result Date: 12/22/2018  Lower Venous Study Indications: Edema.  Risk Factors: None identified. Limitations: Body habitus and poor ultrasound/tissue interface. Comparison Study: No prior studies. Performing Technologist: Oliver Hum RVT  Examination Guidelines: A complete evaluation includes B-mode imaging, spectral Doppler, color Doppler, and power Doppler as needed of all accessible portions of each vessel. Bilateral testing is considered an integral part of a complete examination. Limited examinations for reoccurring indications may be performed as noted.  +---------+---------------+---------+-----------+----------+--------------+  RIGHT     Compressibility Phasicity Spontaneity Properties Thrombus Aging  +---------+---------------+---------+-----------+----------+--------------+  CFV       Full            Yes       Yes                                    +---------+---------------+---------+-----------+----------+--------------+  SFJ       Full                                                             +---------+---------------+---------+-----------+----------+--------------+  FV Prox   Full                                                             +---------+---------------+---------+-----------+----------+--------------+  FV Mid     Full                                                             +---------+---------------+---------+-----------+----------+--------------+  FV Distal Full                                                             +---------+---------------+---------+-----------+----------+--------------+  PFV       Full                                                             +---------+---------------+---------+-----------+----------+--------------+  POP       Full            Yes       Yes                                    +---------+---------------+---------+-----------+----------+--------------+  PTV       Full                                                             +---------+---------------+---------+-----------+----------+--------------+  PERO      Full                                                             +---------+---------------+---------+-----------+----------+--------------+   +---------+---------------+---------+-----------+----------+--------------+  LEFT      Compressibility Phasicity Spontaneity Properties Thrombus Aging  +---------+---------------+---------+-----------+----------+--------------+  CFV       Full            Yes       Yes                                    +---------+---------------+---------+-----------+----------+--------------+  SFJ       Full                                                             +---------+---------------+---------+-----------+----------+--------------+  FV Prox   Full                                                             +---------+---------------+---------+-----------+----------+--------------+  FV Mid    Full                                                             +---------+---------------+---------+-----------+----------+--------------+  FV Distal Full                                                             +---------+---------------+---------+-----------+----------+--------------+  PFV       Full                                                              +---------+---------------+---------+-----------+----------+--------------+  POP       Full            Yes       Yes                                    +---------+---------------+---------+-----------+----------+--------------+  PTV       Full                                                             +---------+---------------+---------+-----------+----------+--------------+  PERO      Full                                                             +---------+---------------+---------+-----------+----------+--------------+     Summary: Right: There is no evidence of deep vein thrombosis in the lower extremity. No cystic structure found in the popliteal fossa. Left: There is no evidence of deep vein thrombosis in the lower extremity. No cystic structure found in the popliteal fossa.  *See table(s) above for measurements and observations. Electronically signed by Curt Jews MD on 12/22/2018 at 9:30:31 AM.    Final    [place] Ekg Site Rite  Result Date: 12/31/2018 If Site Rite image not attached, placement could not be confirmed due to current cardiac rhythm.  Dg Fl Guided Lumbar Puncture  Result Date: 12/14/2018 CLINICAL DATA:  Lower extremity weakness, decreased PO intake, dehydration, concern for Guillain-Barre syndrome EXAM: DIAGNOSTIC LUMBAR PUNCTURE UNDER FLUOROSCOPIC GUIDANCE FLUOROSCOPY TIME:  Fluoroscopy Time: 72 seconds Radiation Exposure Index (if provided by the fluoroscopic device): 395.4 mGy Number of Acquired Spot Images: 3 PROCEDURE: Informed consent was obtained from the patient prior to the procedure, including potential complications of headache, allergy, and pain. With the patient prone, the lower back was prepped with Betadine. 1% Lidocaine was used for local anesthesia. Lumbar puncture was performed at the L5-S1 level using a 20 gauge needle with return of initially blood tinged, subsequently clear CSF. Extremely low opening pressure, which was not measured. 10.5 ml of CSF  were obtained for laboratory studies. The patient tolerated the procedure well and there were no apparent complications. During the procedure, the patient's NG tube started to withdraw. This was returned to the original position in the gastric cardia and advanced slightly into the mid body of the stomach. IMPRESSION: Successful fluoroscopic guided lumbar puncture at L5-S1. Patient's NG tube readjusted, as described above. Electronically Signed   By: Julian Hy M.D.   On: 12/14/2018 17:19    Labs:  CBC: Recent Labs    12/29/18 0953 12/30/18 0641 12/31/18 0648 01/01/19 0641  WBC 13.0* 17.4* 19.3* 9.8  HGB 11.0* 11.2* 11.4* 10.0*  HCT 34.2* 35.6* 37.0* 33.3*  PLT 897* 947* 671* 460*    COAGS: Recent Labs    12/22/2018 1425  12/09/18 2252 12/12/18 0532 12/13/18 1530 12/14/18 0841  INR 2.9*   < > 3.1* 3.6* 1.1 1.0  APTT 39*  --   --   --   --   --    < > =  values in this interval not displayed.    BMP: Recent Labs    12/29/18 0541 12/30/18 0641 12/31/18 0648 01/01/19 0641  NA 134* 136 142 144  K 5.0 4.5 4.0 3.4*  CL 96* 97* 104 105  CO2 _0 GLUCOSE 98 113* 96 97  BUN 13 21* 17 12  CALCIUM 9.4 9.3 9.8 9.4  CREATININE 0.34* 0.49* 0.44* 0.40*  GFRNONAA >60 >60 >60 >60  GFRAA >60 >60 >60 >60    LIVER FUNCTION TESTS: Recent Labs    12/18/18 0159 12/20/18 0223 12/21/18 0752 12/22/18 0500  BILITOT 1.1 1.2 1.2 1.5*  AST 117* 81* 70* 48*  ALT 134* 102* 93* 65*  ALKPHOS 33* 30* 33* 28*  PROT 8.2* 9.4* 9.1* 8.2*  ALBUMIN 2.6* 2.5* 2.7* 2.7*    TUMOR MARKERS: No results for input(s): AFPTM, CEA, CA199, CHROMGRNA in the last 8760 hours.  Assessment and Plan: Dysphagia secondary to resp failure and encephalopathy Imaging reviewed. Has had ileus on CT/KUB that appears to be improving and having output via flexiseal rectal tube. Discussed with mother plans for G-tube and that this is only a means to continue nutrition and will not address nor 'fix' any  underlying conditions. They still wish to move forward. WBC down to 9.8 today, still having fevers but felt to be neurogenic. Cultures negative. Plan for procedure tomorrow. Will give barium via NGT tonight. Stop TF at MN. Risks and benefits image guided gastrostomy tube placement was discussed with the patient including, but not limited to the need for a barium enema during the procedure, bleeding, infection, peritonitis and/or damage to adjacent structures.  All of the patient's questions were answered, patient is agreeable to proceed.  Consent signed and in chart.  Thank you for this interesting consult.  I greatly enjoyed meeting Hiroki Wint and look forward to participating in their care.  A copy of this report was sent to the requesting provider on this date.  Electronically Signed: Ascencion Dike, PA-C 01/01/2019, 11:10 AM   I spent a total of 20 minutes in face to face in clinical consultation, greater than 50% of which was counseling/coordinating care for G-tube

## 2019-01-01 NOTE — Progress Notes (Signed)
The patient was found to have bilateral lower extremity skin damage caused by his TED hose. The TEDs cut deeply into his skin leaving red/purple marking and appeared to be slightly scabby. This was found during morning assessment. The doctor was in the room and said the TED hose could be removed. The patient is still on SCDs.

## 2019-01-01 NOTE — Progress Notes (Signed)
Orthopedic Tech Progress Note Patient Details:  Aaron Vega [birth year] [id number] Called [facility] for (B) resting hand splints. Patient ID: Aaron Vega, male   DOB: [birth year], 23 y.o.   MRN: [id number]   [name] 01/01/2019, 12:52 PM

## 2019-01-01 NOTE — Progress Notes (Signed)
Nutrition Follow-up   RD working remotely.  DOCUMENTATION CODES:   Severe malnutrition in context of acute illness/injury  INTERVENTION:  Continue Vital 1.5 formula via post pyloric Cortrak tube at 25 ml/hr.   Once able to advance past rate of 25 ml/hr, recommend advancing by 10 ml every 4 hours to goal rate of 55 ml/hr.   Continue 60 ml Prostat BID via tube.   Tube feeding to provide 2380 kcal (100% of needs), 149 grams of protein, and 1003 ml of free water.  Recommend consideration of GJ tube placement for continued post pyloric feeds to ensure tolerance.   NUTRITION DIAGNOSIS:   Severe Malnutrition related to acute illness as evidenced by energy intake < or equal to 50% for > or equal to 5 days, percent weight loss; ongoing  GOAL:   Patient will meet greater than or equal to 90% of their needs; progressing  MONITOR:   TF tolerance, Diet advancement, Labs, Weight trends  REASON FOR ASSESSMENT:   Consult Enteral/tube feeding initiation and management, Assessment of nutrition requirement/status  ASSESSMENT:   23 yo male with autism and epilepsy with 6 week hx of rapid decline in cognition and gait in addition to new peripheral neuropathic symptoms since the onset of intractable N/V for 6 weeks. Per neurology, findings most consistent with acute thiamine deficiency, pt with B12 deficiency as well. Pt is nonverbal at baseline. Additional PMH includes fatty liver, HTN, pancreatitis, OSA   Pt admitted with ascending weakness and encephalopathy with extensive workup with working dx of GBS and Wernicke's s/p IVIG and ongoing thiamine repletion.   12/2 TF advanced to goal 12/1 Post pyloric Cortrak placed, on TF @ 20 11/30 extubated 11/29 trickle TF started @ 20, tube at proximal stomach  11/27 pt with sz and aspiration event. Pt vomited. NG to suction with 1.2 L out. Failed cortrak placement. Plan for small bore tube placement in IR this afternoon. After discussion with MD,  family and palliative pt intubated.  11/26 TF @ 40 11/25 TF @ 30 11/23 TF started @ 20 11/30 Extubated   12/5 Pt with vomiting, Ileus developing again per MD 12/7 TF restarted @ 25  Tube feeding infusing at 25 ml/hr. Once able to advance past rate of 25 ml/hr, recommend advancing by 10 ml every 4 hours to goal rate of 55 ml/hr. Plans for possible Gtube placement tomorrow. Noted pt with post pyloric feedings as pt has not tolerated gastric feeds in the past. Recommend consideration of GJ tube placement to ensure continued tolerance. Discussed possible GJ tube with MD and radiology. RD to continue to monitor.   Labs and medications reviewed.   Diet Order:   Diet Order            Diet NPO time specified  Diet effective now              EDUCATION NEEDS:   Not appropriate for education at this time  Skin:  Skin Assessment: Skin Integrity Issues: Skin Integrity Issues:: DTI DTI: buttocks  Last BM:  11/29- 500 ml  Height:   Ht Readings from Last 1 Encounters:  01/06/19 5\' 11"  (1.803 m)    Weight:   Wt Readings from Last 1 Encounters:  01/01/19 (!) 140.3 kg    Ideal Body Weight:  78.1 kg  BMI:  Body mass index is 43.14 kg/m.  Estimated Nutritional Needs:   Kcal:  2200-2500  Protein:  120-140 grams  Fluid:  >/= 2 L    [place]letta [place]  Cecilie Lowers, MS, RD, LDN Pager # [phone] After hours/ weekend pager # [phone]

## 2019-01-01 NOTE — Progress Notes (Signed)
Dr. Maylene Roes ordered to start patient's tube feeding back at 25 ml/hr to be boosted back to 55 ml/hr tomorrow morning, if tolerated.

## 2019-01-01 NOTE — Progress Notes (Signed)
Occupational Therapy Treatment Patient Details Name: Aaron Vega MRN: [id number] DOB: Dec 08, 1995 Today's Date: 01/01/2019    History of present illness 23 y.o. male with medical history significant of autism (nonverbal), Tourettes, seizures, obesity, reflux esophagitis, hypertension constipation and sleep apnea. 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 Admitted for lethargy and vomiting. Found to have acute on chronic respiratory failure and intubated 11/27-11/30, ileus, HTN, tachycardia, aspiration, ascending weakness and encephalopathy.   OT comments  Pt with bed used to help with BP control this session with the aid of x2 ace wraps and SCD machine. Pt noted to have wounds from ted hose being worn in the bed supine. All ace wraps were removed at the end of session as patient supine with stable BP and decrease risk for skin wounds. Pt with aroused to focused attention this session. Pt less interactive then previous session. Pt noted to have muscle recruitment for breathing with RR 23 but wet sounds to breathing. Pt unable to illicit a cough at all this session. Mother with multiple questions that are all based around prognosis and expectations with therapy.    Follow Up Recommendations  (maximize all possible care for home)    Equipment Recommendations  Hospital bed;Other (comment)(lift)    Recommendations for Other Services Other (comment)(palliative- mom keeps asking questions about prognosis)    Precautions / Restrictions Precautions Precautions: Fall;Other (comment) Precaution Comments: cortrak, rectal tube Restrictions Weight Bearing Restrictions: Yes RLE Weight Bearing: Non weight bearing LLE Weight Bearing: Non weight bearing       Mobility Bed Mobility Overal bed mobility: Needs Assistance Bed Mobility: Supine to Sit Rolling: +2 for physical assistance;Total  assist         General bed mobility comments: total +2 total to long sit in the bed in attempt to help with increased arousal and help patient attempt to cough  Transfers                 General transfer comment: not appropriate at this time    Balance Overall balance assessment: Needs assistance   Sitting balance-[name]: Zero                                     ADL either performed or assessed with clinical judgement   ADL Overall ADL's : Needs assistance/impaired Eating/Feeding: NPO   Grooming: Total assistance   Upper Body Bathing: Total assistance   Lower Body Bathing: Total assistance   Upper Body Dressing : Total assistance   Lower Body Dressing: Total assistance                 General ADL Comments: bed used to increase HOB for tolerance and closely monitor BP this session.  see vitals below     Vision       Perception     Praxis      Cognition Arousal/Alertness: Awake/alert Behavior During Therapy: Flat affect Overall Cognitive Status: Difficult to assess                                 General Comments: nonverbal, lift head to name call, less aroused than previous sesisons. pt with more of a focused attention during session. pt with less initiation toward offers of food by SLP.  mother present and reports no attempts to reach for her today.         Exercises Exercises: Other exercises Other Exercises Other Exercises: PROM of bil UE: shoulder flexion, abduction, elbow flexion/ extension, wrist flexion/ extension digits   Shoulder Instructions       General Comments HR 117  RR19 3L 100% [place] 114/56 (74) on arrival. with hob increased 64 degrees HR 118 97% RR23 110/68 (78) with SCD wrapped x2 ace wraps. prolonged position BP 104/64 (75) HR increased 125    Pertinent Vitals/ Pain       Pain Assessment: No/denies pain Faces Pain Scale: No hurt  Home Living                                           Prior Functioning/Environment              Frequency  Min 2X/week        Progress Toward Goals  OT Goals(current goals can now be found in the care plan section)  Progress towards OT goals: Progressing toward goals  Acute Rehab OT Goals Patient Stated Goal: home per family OT Goal Formulation: With family Time For Goal Achievement: 01/09/19 Potential to Achieve Goals: Fair ADL Goals Pt Will Perform Eating: with max assist;bed level Pt Will Perform Grooming: with max assist;bed level Pt Will Transfer to Toilet: with transfer board;bedside commode;with +2 assist;with max assist Pt/caregiver will Perform Home Exercise Program: Increased strength;Both right and left upper extremity;With written HEP provided Additional ADL Goal #1: pt will initate log roll with head rotation Additional ADL Goal #2: pt will demonstrate UE activation for adl task with any movement toward object  Plan Discharge plan remains appropriate    Co-evaluation    PT/OT/SLP Co-Evaluation/Treatment: Yes Reason for Co-Treatment: Complexity of the patient's impairments (multi-system involvement);Necessary to address cognition/behavior during functional activity;For patient/therapist safety;To address functional/ADL transfers   OT goals addressed during session: ADL's and self-care;Proper use of Adaptive equipment and DME;Strengthening/ROM SLP goals addressed during session: Swallowing    AM-PAC OT "6 Clicks" Daily Activity     Outcome Measure   Help from another person eating meals?: Total Help from another person taking care of personal grooming?: Total Help from another person toileting, which includes using toliet, bedpan, or urinal?: Total Help from another person bathing (including washing, rinsing, drying)?: Total Help from another person to put on and taking off regular upper body clothing?: Total Help from another person to put on and taking off regular lower body clothing?:  Total 6 Click Score: 6    End of Session Equipment Utilized During Treatment: Oxygen  OT Visit Diagnosis: Unsteadiness on feet (R26.81);Muscle weakness (generalized) (M62.81);Adult, failure to thrive (R62.7);History of falling (Z91.81)   Activity Tolerance Patient tolerated treatment well   Patient Left in bed;with call bell/phone within reach;with bed alarm set;with nursing/sitter in room;with SCD's reapplied   Nurse Communication Mobility status;Precautions        Time: 0350-0938 OT Time Calculation (min): 32 min  Charges: OT General Charges $OT Visit: 1 Visit OT Treatments $Neuromuscular Re-education: 8-22 mins   Brynn, OTR/L  Acute Rehabilitation Services Pager: [phone] Office: [phone] .    [name] 01/01/2019, 12:58 PM

## 2019-01-01 NOTE — Progress Notes (Signed)
PROGRESS NOTE    Aaron Vega  YQM:[id number] DOB: [birth year] DOA: 12/14/2018 PCP: [facility]     Brief Narrative:  Aaron Vega is a 23 yo male with past medical history significant for severe autism with seizures, nonverbal at baseline, who presented with declining health.  He suffered a severe seizure at home and struck his head in August 2020 and has been worsening in mentation since that time.  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  He presented to the emergency department for increasing lethargy, vomiting and decreased urine output.  He had been previously enrolled in hospice at home, but mother has now revoked hospice.   11/14 Admission to [facility] for acute metabolic encephalopathy, severe sepsis due to aspiration pneumonia, ileus  11/16 Transfer to ICU due to respiratory distress, worsening lactic acidosis  11/20 LP  11/21 Transferred to [facility]  11/27 Transferred back to ICU due to aspiration event and requiring intubation  11/30 Off pressors, extubated 12/5 Transferred to progressive unit, TRH service  12/6 Early AM had vomiting and possibly aspiration event. PCCM re-consulted, patient improved after deep NT suctioning   New events last 24 hours / Subjective: Afebrile this morning. Having BM. Back on [place] O2 off NRB. No acute events to report.   Assessment & Plan:   Principal Problem:   Severe sepsis ([place]) Active Problems:   Autistic disorder   Intractable nausea and vomiting   Seizure disorder (HCC)   Constipation in male   Obesity, Class III, BMI 40-49.9 (morbid obesity) ([place])   Ileus (HCC)   Aspiration pneumonia ([place])   Community acquired pneumonia of left lower lobe of lung   Goals of care, counseling/discussion   Palliative care by specialist   DNR (do not resuscitate) discussion   Protein-calorie malnutrition, severe   Weakness of lower extremity   Aspiration into airway  Pressure injury of skin   Fever   Diarrhea    Acute on chronic respiratory failure -Obstructive sleep apnea history, not tolerant of positive pressure ventilation -Now extubated -Continue deep suction prn, chest PT -At high risk for aspiration and reintubation -Back on [place] O2 this morning, off NRB   Severe sepsis secondary to aspiration pneumonia -S/p Cefepime, flagyl 11/14-11/18; Unasyn 11/28-12/2; Unasyn 12/7-->  -Blood culture 11/14 negative -Blood culture 11/26 negative  -Continue aspiration precautions -SLP following, NPO for now -PEG placement; consulted IR  -WBC normal and afebrile this morning   Acute metabolic encephalopathy, cause unclear likely multifactorial 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 Vit B12 159. Continue thiamine, B12, MV  -Continue Keppra, Onfi -Neurology signed off. Patient may follow up with his Neurologist at [facility] Dr. Opal Sidles. 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  -Completed 5 days of IVIG and high-dose thiamine replacement  -Continue thiamine  -PT OT   Ileus -Has had recurrent, improving, then worsening ileus since hospitalization 11/14 -Overnight 12/5 had episode of vomiting, repeat abdominal xray showing multiple dilated small bowel loops, increased from previous, suggesting obstruction or developing ileus, again -Repeat abdominal x-ray showing nonspecific bowel gas pattern, decreased gaseous distention of bowel since prior study -Appreciate GI assistance -Resume tube feeding today at 1/2 usual rate and titrate to goal in 24 hours  -Stop magnesium which is a cathartic  in some patient  -Monitor BM closely. Patient was on erythromycin previously for ileus   Hypothyroidism -Continue synthroid    HTN -Continue lisinopril, metoprolol   Hypokalemia -Replace, trend     DVT prophylaxis: Lovenox  Code Status: Full code Family Communication: Mother at bedside  Disposition Plan: Eventual PEG placement and discharge home, per family wishes. Continue full scope of care.    Consultants:   PCCM  Neurology  Palliative care  GI   Antimicrobials:  Anti-infectives (From admission, onward)   Start     Dose/Rate Route Frequency Ordered Stop   12/31/18 1100  Ampicillin-Sulbactam (UNASYN) 3 g in sodium chloride 0.9 % 100 mL IVPB     3 g 200 mL/hr over 30 Minutes Intravenous Every 6 hours 12/31/18 1000 01/05/19 1059   12/22/18 0900  ampicillin-sulbactam (UNASYN) 1.5 g in sodium chloride 0.9 % 100 mL IVPB  Status:  Discontinued     1.5 g 200 mL/hr over 30 Minutes Intravenous Every 6 hours 12/22/18 0851 12/22/18 0857   12/22/18 0900  Ampicillin-Sulbactam (UNASYN) 3 g in sodium chloride 0.9 % 100 mL IVPB     3 g 200 mL/hr over 30 Minutes Intravenous Every 6 hours 12/22/18 0857 12/26/18 2105   12/19/18 1400  erythromycin (EES) 400 MG/5ML suspension 500 mg  Status:  Discontinued     500 mg Per Tube Every 8 hours 12/19/18 1132 12/30/18 1013   12/18/18 1500  erythromycin 500 mg in sodium chloride 0.9 % 100 mL IVPB  Status:  Discontinued     500 mg 100 mL/hr over 60 Minutes Intravenous Every 8 hours 12/18/18 1449 12/19/18 1132   12/15/18 1400  erythromycin (EES) 400 MG/5ML suspension 400 mg  Status:  Discontinued     400 mg Per Tube Every 8 hours 12/15/18 1012 12/15/18 1130   12/15/18 1400  erythromycin 500 mg in sodium chloride 0.9 % 100 mL IVPB  Status:  Discontinued     500 mg 100 mL/hr over 60 Minutes Intravenous Every 8 hours 12/15/18 1130 12/18/18 1449   12/09/18 0600  vancomycin (VANCOCIN) 1,250 mg in sodium chloride 0.9 % 250 mL IVPB  Status:  Discontinued     1,250 mg 166.7 mL/hr over 90 Minutes Intravenous Every 12 hours 12/20/2018 1539 12/10/18 1043   12/09/18 0000   ceFEPIme (MAXIPIME) 2 g in sodium chloride 0.9 % 100 mL IVPB  Status:  Discontinued     2 g 200 mL/hr over 30 Minutes Intravenous Every 8 hours 12/18/2018 1539 12/11/18 1801   12/09/18 0000  metroNIDAZOLE (FLAGYL) IVPB 500 mg  Status:  Discontinued     500 mg 100 mL/hr over 60 Minutes Intravenous Every 8 hours 12/24/2018 2146 12/11/18 1801   12/06/2018 1415  ceFEPIme (MAXIPIME) 2 g in sodium chloride 0.9 % 100 mL IVPB     2 g 200 mL/hr over 30 Minutes Intravenous  Once 12/01/2018 1411 11/29/2018 1538   12/04/2018 1415  metroNIDAZOLE (FLAGYL) IVPB 500 mg     500 mg 100 mL/hr over 60 Minutes Intravenous  Once 12/16/2018 1411 12/04/2018 1609   12/20/2018 1415  vancomycin (VANCOCIN) IVPB 1000 mg/200 mL premix  Status:  Discontinued     1,000 mg 200 mL/hr over 60 Minutes Intravenous  Once 11/27/2018 1411 12/18/2018 1414   12/13/2018 1415  vancomycin (VANCOCIN) 2,500 mg in sodium chloride 0.9 % 500 mL IVPB     2,500 mg 250 mL/hr over 120 Minutes Intravenous  Once 12/03/2018 1414 12/18/2018 1710  Objective: Vitals:   01/01/19 0000 01/01/19 0403 01/01/19 0500 01/01/19 0916  BP: 107/63 102/66  111/77  Pulse: (!) 107 (!) 111  (!) 102  Resp: 20   (!) 25  Temp: (!) 100.7 F (38.2 C) 99.3 F (37.4 C)  98.1 F (36.7 C)  TempSrc: Oral Oral    SpO2: 97%   91%  Weight:   (!) 140.3 kg   Height:        Intake/Output Summary (Last 24 hours) at 01/01/2019 1033 Last data filed at 01/01/2019 0500 Gross per 24 hour  Intake 0 ml  Output 3050 ml  Net -3050 ml   Filed Weights   12/30/18 0500 12/31/18 0500 01/01/19 0500  Weight: (!) 137.3 kg (!) 138.2 kg (!) 140.3 kg    Examination: General exam: Appears calm and comfortable  Respiratory system: Clear to auscultation. Respiratory effort normal. On [place] O2  Cardiovascular system: S1 & S2 heard, RRR. No pedal edema. Gastrointestinal system: Abdomen is nondistended, soft and nontender. +rectal tube in place  Central nervous system: Alert to voice, nonverbal at baseline   Extremities: Symmetric in appearance bilaterally  Skin: No rashes, lesions or ulcers on exposed skin     Data Reviewed: I have personally reviewed following labs and imaging studies  CBC: Recent Labs  Lab 12/28/18 0620 12/29/18 0953 12/30/18 0641 12/31/18 0648 01/01/19 0641  WBC 15.7* 13.0* 17.4* 19.3* 9.8  HGB 11.2* 11.0* 11.2* 11.4* 10.0*  HCT 35.8* 34.2* 35.6* 37.0* 33.3*  MCV 89.9 87.7 90.4 93.4 93.8  PLT 854* 897* 947* 671* 481*   Basic Metabolic Panel: Recent Labs  Lab 12/26/18 0748  12/27/18 0843 12/27/18 1645 12/28/18 0620 12/28/18 1809 12/29/18 0541 12/30/18 0641 12/31/18 0648 01/01/19 0641  NA  --   --  140  --   --   --  134* 136 142 144  K  --   --  4.2  --   --   --  5.0 4.5 4.0 3.4*  CL  --   --  104  --   --   --  96* 97* 104 105  CO2  --   --  28  --   --   --  _0 GLUCOSE  --   --  90  --   --   --  98 113* 96 97  BUN  --   --  9  --   --   --  13 21* 17 12  CREATININE  --   --  0.34*  --   --   --  0.34* 0.49* 0.44* 0.40*  CALCIUM  --   --  9.0  --   --   --  9.4 9.3 9.8 9.4  MG 1.7  --   --   --   --   --   --   --   --  2.2  PHOS 3.4   < > 3.1 3.3 4.0 4.5 5.8*  --   --   --    < > = values in this interval not displayed.   GFR: Estimated Creatinine Clearance: 209.3 mL/min (A) (by C-G formula based on SCr of 0.4 mg/dL (L)). Liver Function Tests: No results for input(s): AST, ALT, ALKPHOS, BILITOT, PROT, ALBUMIN in the last 168 hours. No results for input(s): LIPASE, AMYLASE in the last 168 hours. No results for input(s): AMMONIA in the last 168 hours. Coagulation Profile: No results for input(s): INR, PROTIME  in the last 168 hours. Cardiac Enzymes: No results for input(s): CKTOTAL, CKMB, CKMBINDEX, TROPONINI in the last 168 hours. BNP (last 3 results) No results for input(s): PROBNP in the last 8760 hours. HbA1C: No results for input(s): HGBA1C in the last 72 hours. CBG: Recent Labs  Lab 12/31/18 0355 12/31/18 0756  12/31/18 1224 12/31/18 1627 12/31/18 1943  GLUCAP 97 105* 86 127* 94   Lipid Profile: No results for input(s): CHOL, HDL, LDLCALC, TRIG, CHOLHDL, LDLDIRECT in the last 72 hours. Thyroid Function Tests: No results for input(s): TSH, T4TOTAL, FREET4, T3FREE, THYROIDAB in the last 72 hours. Anemia Panel: No results for input(s): VITAMINB12, FOLATE, FERRITIN, TIBC, IRON, RETICCTPCT in the last 72 hours. Sepsis Labs: No results for input(s): PROCALCITON, LATICACIDVEN in the last 168 hours.  Recent Results (from the past 240 hour(s))  C difficile quick scan w PCR reflex     Status: None   Collection Time: 12/30/18 11:50 AM   Specimen: STOOL  Result Value Ref Range Status   C Diff antigen NEGATIVE NEGATIVE Final   C Diff toxin NEGATIVE NEGATIVE Final   C Diff interpretation No C. difficile detected.  Final    Comment: Performed at [facility], [place] [address] [place], [place] [address]      Radiology Studies: Dg Chest [place] 1 View  Result Date: 12/31/2018 CLINICAL DATA:  23 year old male with a history aspiration EXAM: PORTABLE CHEST 1 VIEW COMPARISON:  12/30/2018, 12/28/2018 FINDINGS: Cardiomediastinal silhouette unchanged in size and contour. Low lung volumes persist with linear opacities at the lung bases. No pneumothorax or large pleural effusion. Crowding of the interstitium and the vasculature. Enteric tube projects over the mediastinum and terminates out of the field of view. IMPRESSION: Unchanged appearance of the chest x-ray with low lung volumes and likely atelectasis. Unchanged enteric feeding tube. Electronically Signed   By: Corrie Mckusick D.O.   On: 12/31/2018 07:45   Dg Abd Portable 1v  Result Date: 12/30/2018 CLINICAL DATA:  Ileus EXAM: PORTABLE ABDOMEN - 1 VIEW COMPARISON:  Portable exam 1715 hours compared to 0906 hours FINDINGS: Tip of feeding tube projects over the third portion of duodenum approaching ligament of Treitz. Scattered gas throughout bowel, decreased  from prior exam. No bowel dilatation or bowel wall thickening. Osseous structures unremarkable. No urinary tract calcifications. IMPRESSION: Nonspecific bowel gas pattern. Decreased gaseous distention of bowel since prior study. Electronically Signed   By: Lavonia Dana M.D.   On: 12/30/2018 19:20   [place] Ekg Site Rite  Result Date: 12/31/2018 If Site Rite image not attached, placement could not be confirmed due to current cardiac rhythm.     Scheduled Meds:  chlorhexidine gluconate (MEDLINE KIT)  15 mL Mouth Rinse BID   Chlorhexidine Gluconate Cloth  6 each Topical Q0600   cloBAZam  60 mg Per Tube BID   enoxaparin (LOVENOX) injection  40 mg Subcutaneous Q12H   feeding supplement (PRO-STAT SUGAR FREE 64)  60 mL Per Tube BID   levothyroxine  25 mcg Per Tube Q0600   lisinopril  40 mg Per NG tube Daily   mouth rinse  15 mL Mouth Rinse q12n4p   metoprolol tartrate  125 mg Per NG tube BID   multivitamin  15 mL Per Tube Daily   nystatin   Topical Daily   pantoprazole sodium  40 mg Per Tube BID   potassium chloride  40 mEq Per Tube Daily   sodium chloride flush  10-40 mL Intracatheter Q12H   sodium  chloride flush  10-40 mL Intracatheter Q12H   tamsulosin  0.4 mg Oral Daily   thiamine injection  100 mg Intravenous Daily   vitamin B-12  1,000 mcg Per Tube Daily   Continuous Infusions:  ampicillin-sulbactam (UNASYN) IV 3 g (01/01/19 0530)   feeding supplement (VITAL 1.5 CAL) 55 mL/hr at 12/30/18 0525   levETIRAcetam 1,000 mg (12/31/18 2315)     LOS: 24 days      Time spent: 35 minutes   Dessa Phi, DO [facility] 01/01/2019, 10:33 AM   Available via Epic secure chat 7am-7pm After these hours, please refer to coverage provider listed on [email or web address]

## 2019-01-01 NOTE — Progress Notes (Signed)
  Speech Language Pathology Treatment: Dysphagia  Patient Details Name: Aaron Vega MRN: [id number] DOB: 10-25-1995 Today's Date: 01/01/2019 Time: [id number] SLP Time Calculation (min) (ACUTE ONLY): 32 min  Assessment / Plan / Recommendation Clinical Impression  Pt was seen with PT and OT to maximize positioning and arousal although pt remains quite lethargic today. He opened his eyes briefly during ROM and brought his head toward a spoon/straw a few times, but made no attempts at labial seal or lingual movement. SLP provided small amounts of water to his anterior oral cavity, almost all of which spilled passively out of his mouth. What remained was removed via suction. Pt's mother was present and educated on current level of function, currently working on [employer] activities like accepting a bolus orally. Would continue NPO status for now.   HPI HPI: Aaron Vega is a 23 y.o. male with medical history significant of autism (nonverbal), Tourettes, seizures, obesity, reflux esophagitis, hypertension constipation and sleep apnea. 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 Admitted for lethargy and vomiting. Found to have acute on chronic respiratory failure and intubated 11/27-11/30, ileus, HTN, tachycardia, aspiration, ascending weakness and encephalopathy. CXR No other acute cardiopulmonary abnormality. No other ST notes found.      SLP Plan  Continue with current plan of care       Recommendations  Diet recommendations: NPO Medication Administration: Via alternative means                Oral Care Recommendations: Oral care QID Follow up Recommendations: 24 hour supervision/assistance SLP Visit Diagnosis: Dysphagia, unspecified (R13.10) Plan: Continue with current plan of care       GO                Aaron Vega 01/01/2019, 12:11 PM  Aaron Vega, M.A.  [place] Acute Environmental education officer [phone] Office [phone]

## 2019-01-02 ENCOUNTER — Encounter (HOSPITAL_COMMUNITY): Payer: Self-pay | Admitting: Interventional Radiology

## 2019-01-02 ENCOUNTER — Inpatient Hospital Stay (HOSPITAL_COMMUNITY)

## 2019-01-02 DIAGNOSIS — R1310 Dysphagia, unspecified: Secondary | ICD-10-CM

## 2019-01-02 DIAGNOSIS — R509 Fever, unspecified: Secondary | ICD-10-CM

## 2019-01-02 DIAGNOSIS — G610 Guillain-Barre syndrome: Secondary | ICD-10-CM | POA: Diagnosis present

## 2019-01-02 DIAGNOSIS — E512 Wernicke's encephalopathy: Secondary | ICD-10-CM | POA: Diagnosis present

## 2019-01-02 DIAGNOSIS — T17908S Unspecified foreign body in respiratory tract, part unspecified causing other injury, sequela: Secondary | ICD-10-CM

## 2019-01-02 HISTORY — PX: IR GASTROSTOMY TUBE MOD SED: IMG625

## 2019-01-02 LAB — BASIC METABOLIC PANEL
Anion gap: 13 (ref 5–15)
BUN: 14 mg/dL (ref 6–20)
CO2: 28 mmol/L (ref 22–32)
Calcium: 9.6 mg/dL (ref 8.9–10.3)
Chloride: 104 mmol/L (ref 98–111)
Creatinine, Ser: 0.4 mg/dL — ABNORMAL LOW (ref 0.61–1.24)
GFR calc Af Amer: >60 mL/min (ref >60)
GFR calc non Af Amer: >60 mL/min (ref >60)
Glucose, Bld: 103 mg/dL — ABNORMAL HIGH (ref 70–99)
Potassium: 3.4 mmol/L — ABNORMAL LOW (ref 3.5–5.1)
Sodium: 145 mmol/L (ref 135–145)

## 2019-01-02 LAB — CBC
HCT: 31.1 % — ABNORMAL LOW (ref 39.0–52.0)
Hemoglobin: 9.5 g/dL — ABNORMAL LOW (ref 13.0–17.0)
MCH: 28.2 pg (ref 26.0–34.0)
MCHC: 30.5 g/dL (ref 30.0–36.0)
MCV: 92.3 fL (ref 80.0–100.0)
Platelets: 411 10*3/uL — ABNORMAL HIGH (ref 150–400)
RBC: 3.37 MIL/uL — ABNORMAL LOW (ref 4.22–5.81)
RDW: 17.3 % — ABNORMAL HIGH (ref 11.5–15.5)
WBC: 7.8 10*3/uL (ref 4.0–10.5)
nRBC: 0 % (ref 0.0–0.2)

## 2019-01-02 IMAGING — DX DG ABD PORTABLE 1V
3 series · 3 of 3 positions shown · non-contrast
Comparison: [DATE]

CLINICAL DATA: Severe sepsis.

EXAM:
PORTABLE ABDOMEN - 1 VIEW

[abdomen kub (1 of 3)]
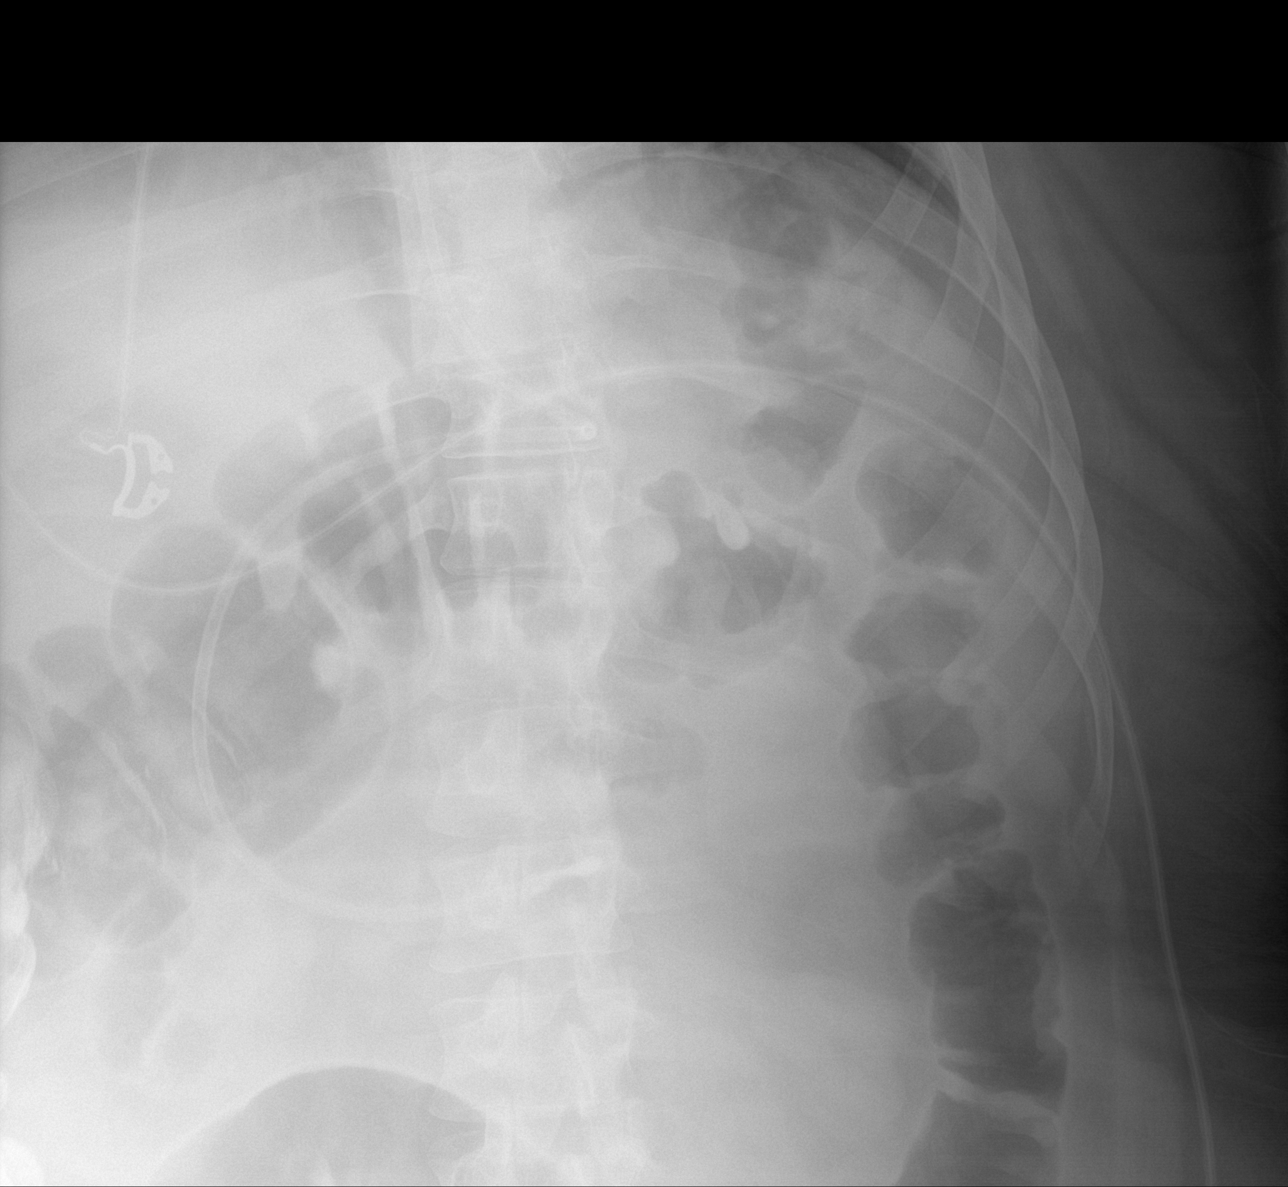

[abdomen kub (2 of 3)]
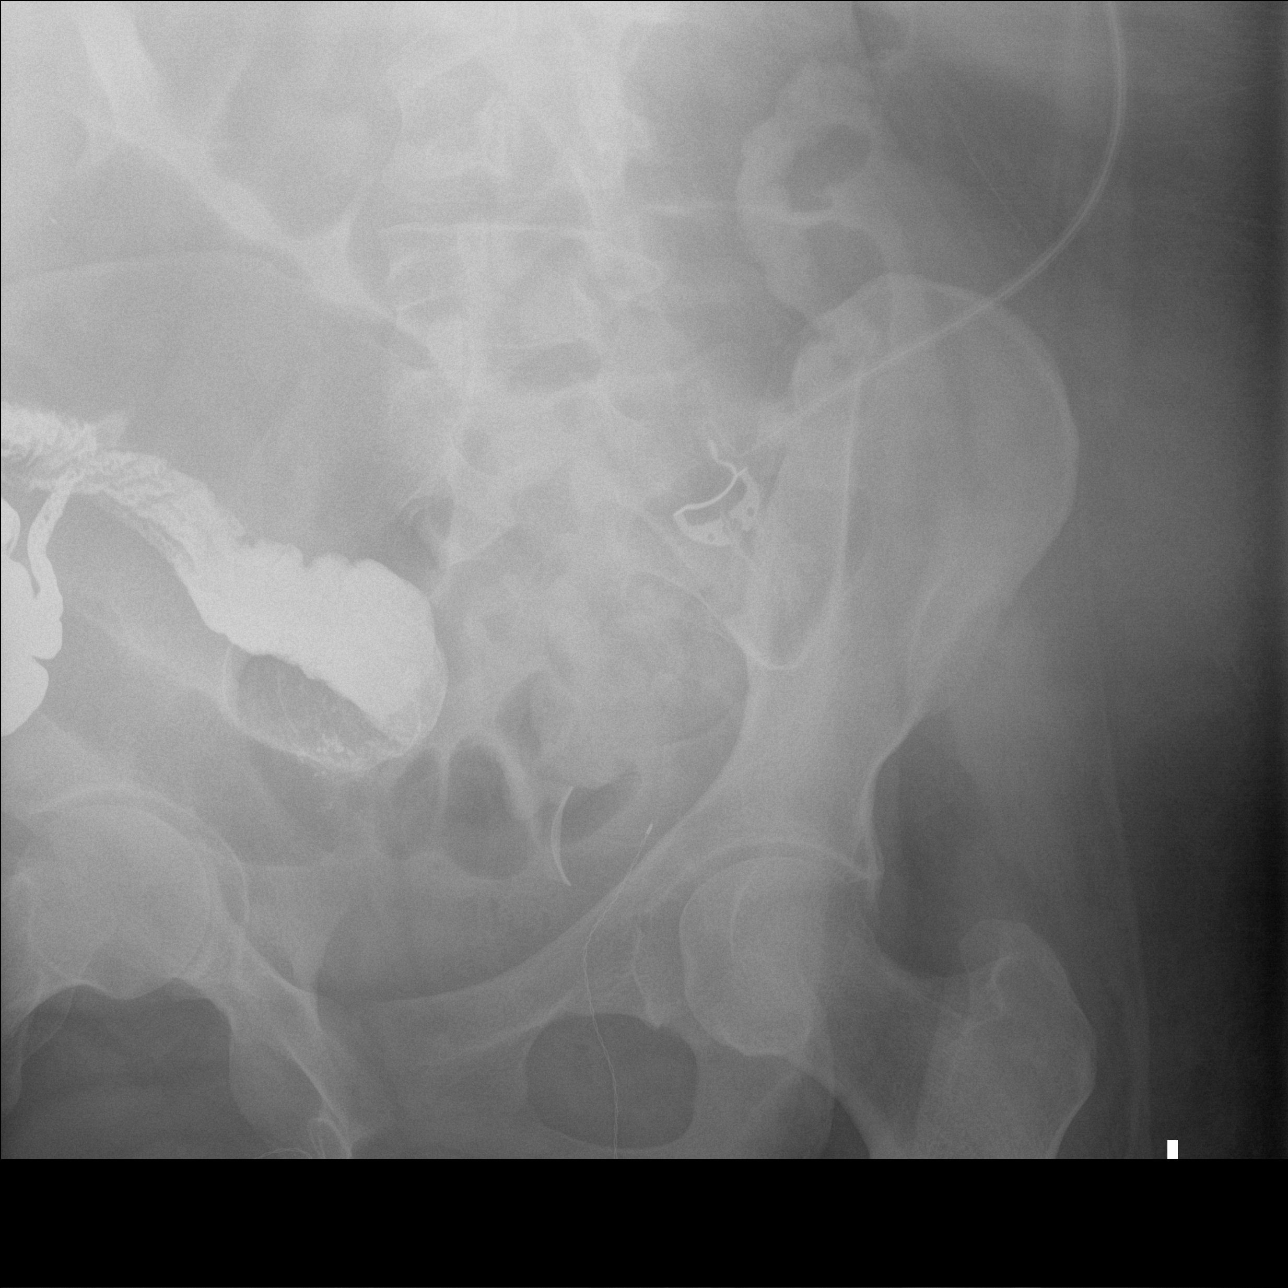

[abdomen kub (3 of 3)]
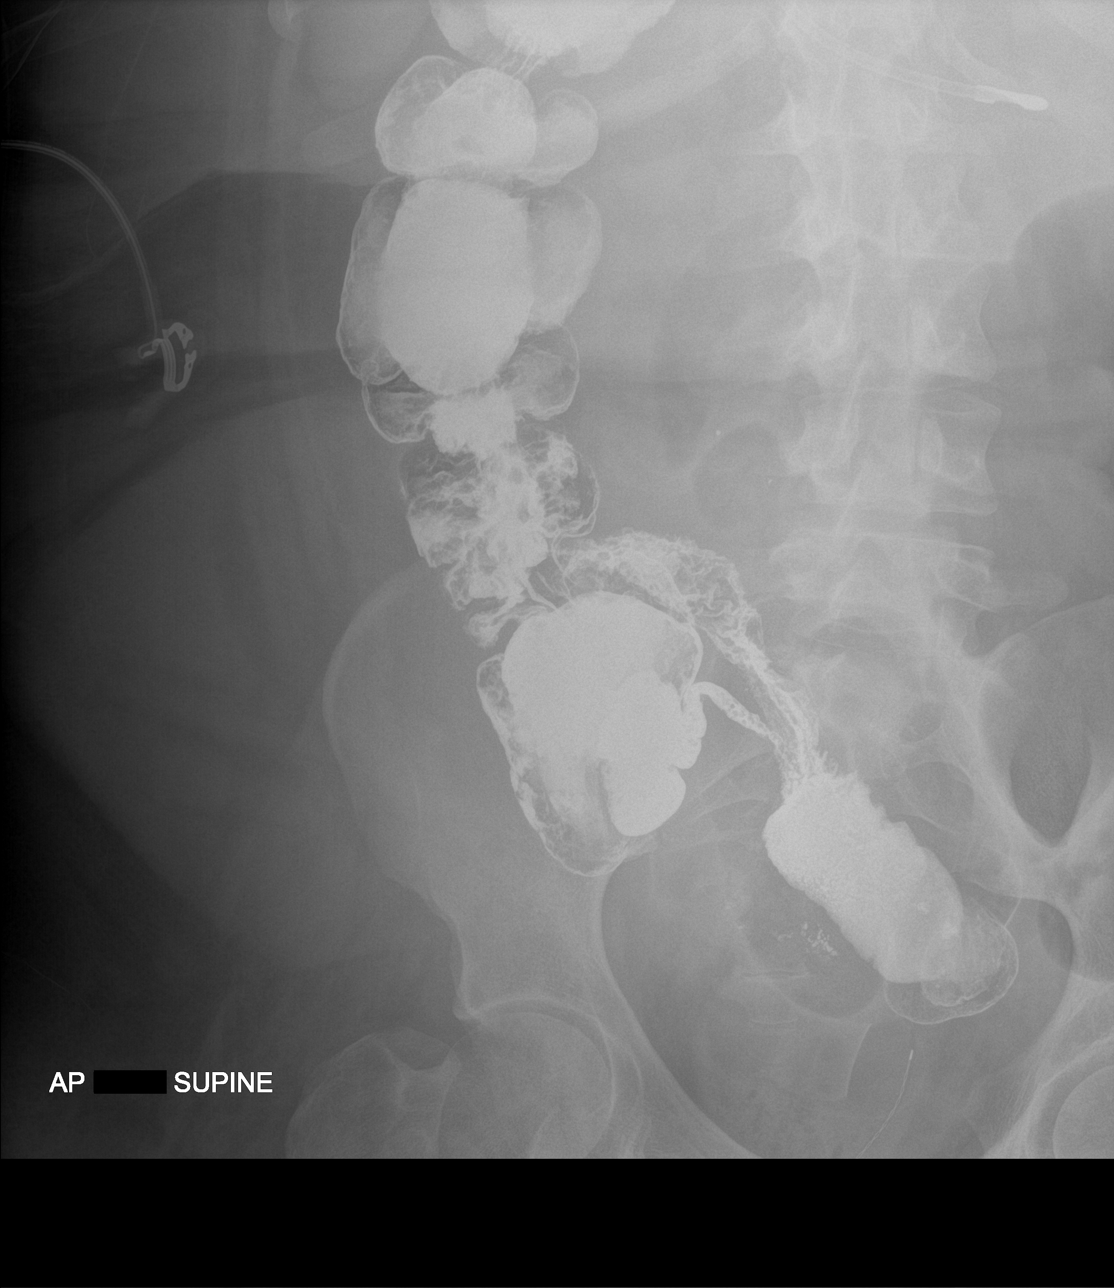

[3 of 3 positions shown; findings below may reference images not displayed]

FINDINGS: Air is present throughout the colon. Contrast is present over the
right colon, terminal ileum and appendix. Small caliber probe
projects over the rectum. Enteric tube is present with tip in the
midline of the mid abdomen likely over the distal duodenum near the
duodenal jejunal junction. Remainder the exam is unchanged
IMPRESSION: 1.  Nonobstructive bowel gas pattern as described.

2. Enteric tube with tip unchanged just left of midline in the upper
abdomen likely over the third/fourth portion of the duodenum.

## 2019-01-02 IMAGING — XA IR PERC PLACEMENT GASTROSTOMY
7 series · 7 of 7 positions shown · non-contrast
Comparison: CT abdomen pelvis-[DATE]

INDICATION: Dysphagia. Please place percutaneous gastrostomy tube for enteric
nutrition supplementation purposes.

EXAM:
PULL TROUGH GASTROSTOMY TUBE PLACEMENT

[Series 1: fl (-) angio · 1 of 1 slices shown (1 of 7)]
[im 1/1]
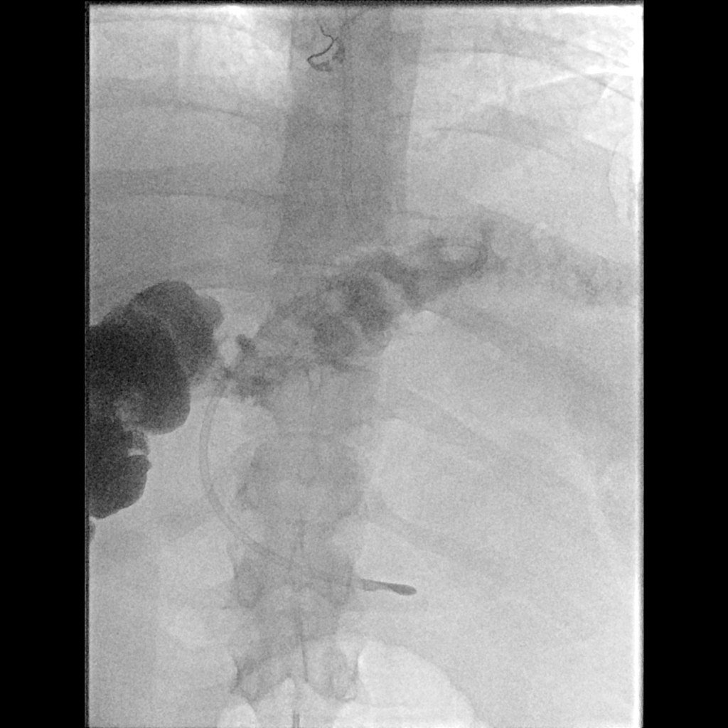

[Series 2: fl (-) angio · 1 of 1 slices shown (2 of 7)]
[im 1/1]
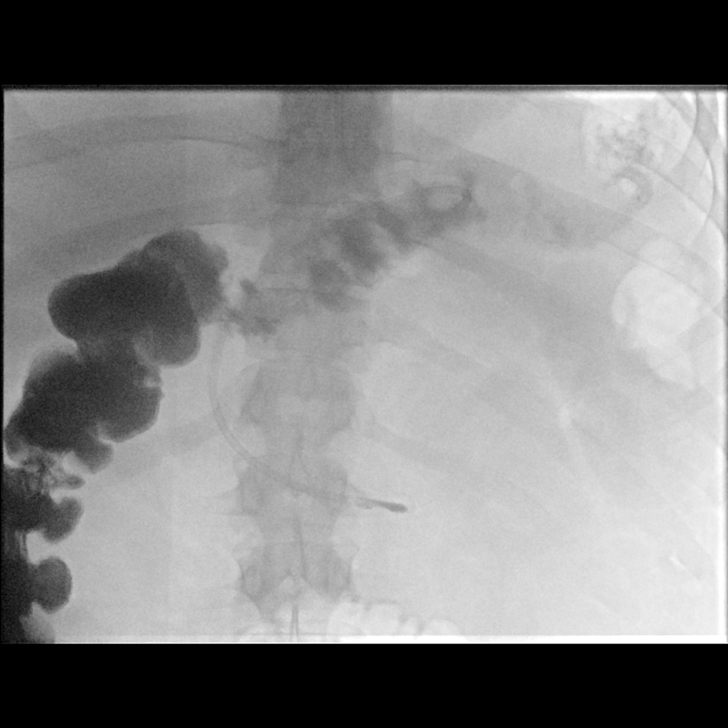

[Series 4: fl (-) angio · 1 of 1 slices shown (3 of 7)]
[im 1/1]
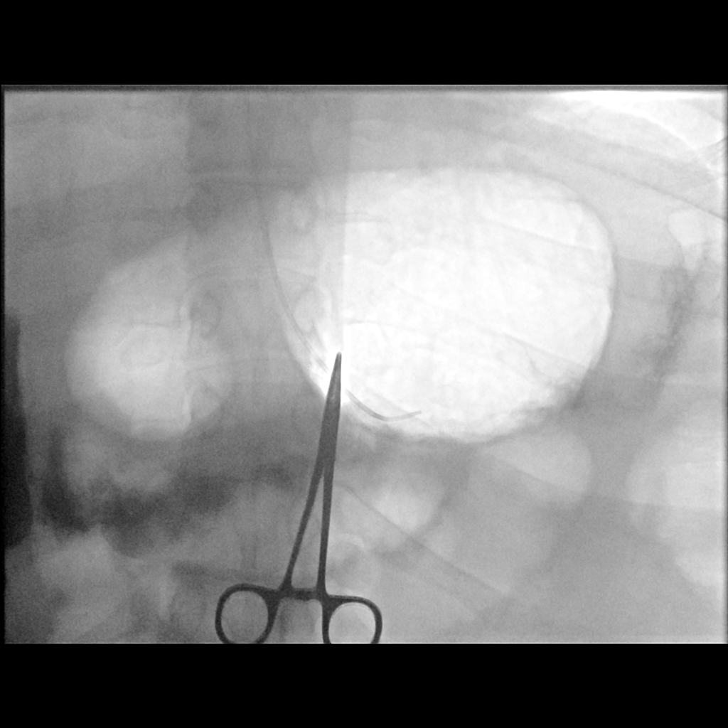

[Series 5: fl (-) angio · 1 of 1 slices shown (4 of 7)]
[im 1/1]
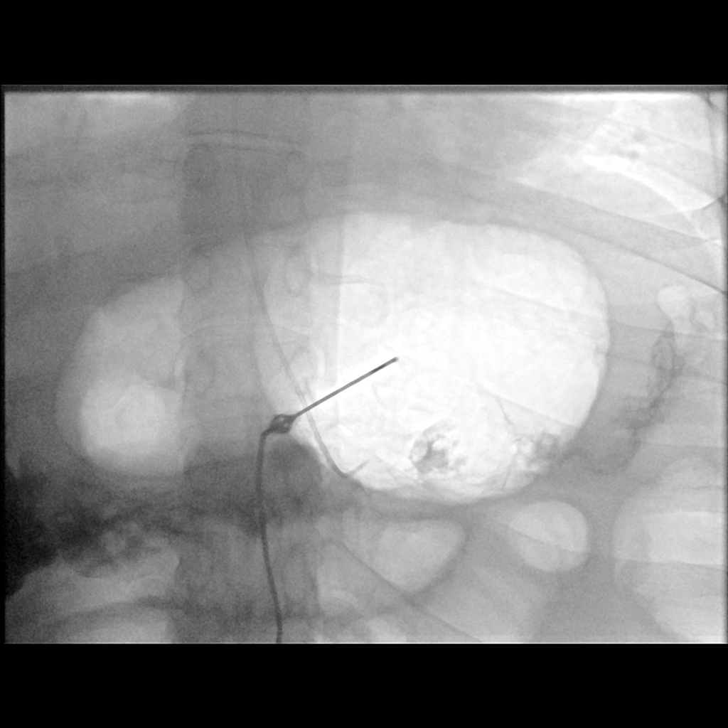

[Series 6: fl (-) angio · 1 of 1 slices shown (5 of 7)]
[im 1/1]
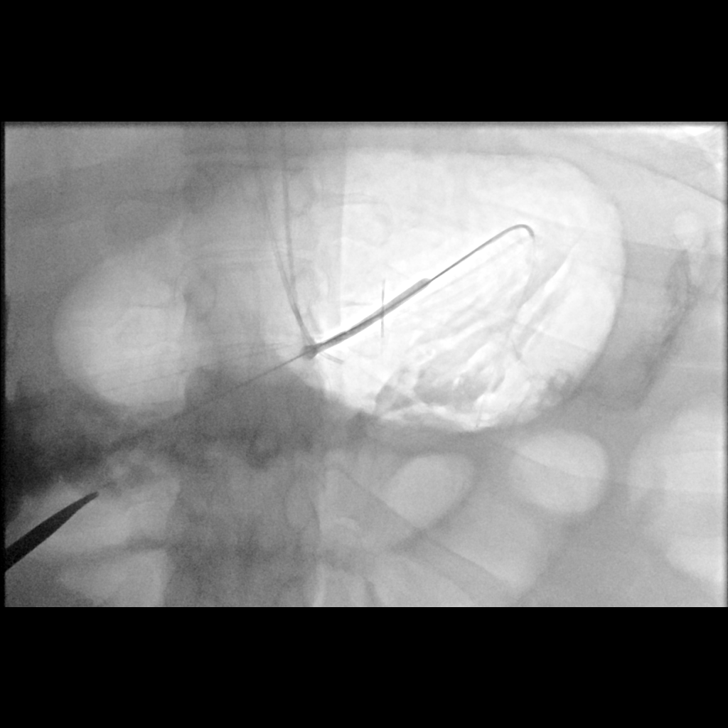

[Series 7: fl (-) angio · 1 of 1 slices shown (6 of 7)]
[im 1/1]
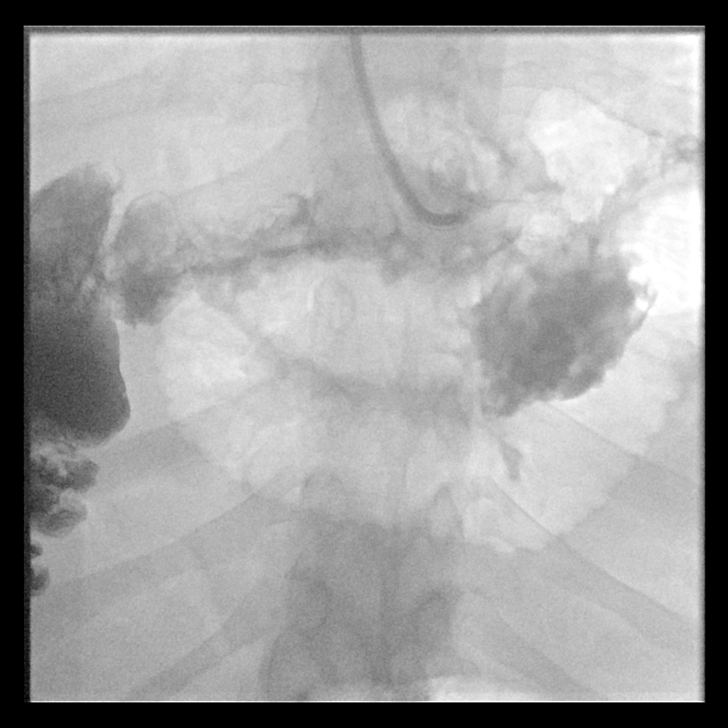

[Series 8: fl (-) angio · 1 of 1 slices shown (7 of 7)]
[im 1/1]
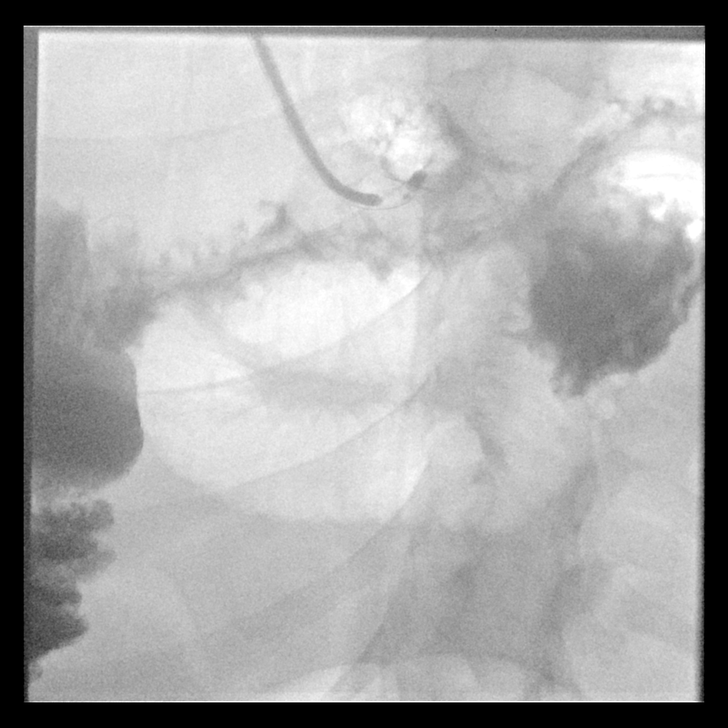

[7 of 7 positions shown; findings below may reference images not displayed]

MEDICATIONS:
Patient is currently admitted to the hospital and receiving
intravenous antibiotics; Antibiotics were administered within 1 hour
of the procedure.

Glucagon 1 mg IV

CONTRAST:  30 mL of [24] administered into the gastric lumen.

ANESTHESIA/SEDATION:
Moderate (conscious) sedation was employed during this procedure. A
total of Versed 1 mg and Fentanyl 50 mcg was administered
intravenously.

Moderate Sedation Time: 10 minutes. The patient's level of
consciousness and vital signs were monitored continuously by
radiology nursing throughout the procedure under my direct
supervision.

FLUOROSCOPY TIME:  3 minutes, 30 seconds (89 mGy)

COMPLICATIONS:
None immediate.

PROCEDURE:
Informed written consent was obtained from the patient following
explanation of the procedure, risks, benefits and alternatives. A
time out was performed prior to the initiation of the procedure.
Ultrasound scanning was performed to demarcate the edge of the left
lobe of the liver. Maximal barrier sterile technique utilized
including caps, mask, sterile gowns, sterile gloves, large sterile
drape, hand hygiene and Betadine prep.

The left upper quadrant was sterilely prepped and draped. An oral
gastric catheter was inserted into the stomach under fluoroscopy.
The existing nasogastric feeding tube was removed. The left costal
margin and barium/air opacified transverse colon were identified and
avoided. Air was injected into the stomach for insufflation and
visualization under fluoroscopy. Under sterile conditions a 17 gauge
trocar needle was utilized to access the stomach percutaneously
beneath the left subcostal margin after the overlying soft tissues
were anesthetized with 1% Lidocaine with epinephrine. Needle
position was confirmed within the stomach with aspiration of air and
injection of small amount of contrast. A single T tack was deployed
for gastropexy. Over an Amplatz guide wire, a 9-French sheath was
inserted into the stomach. A snare device was utilized to capture
the oral gastric catheter. The snare device was pulled retrograde
from the stomach up the esophagus and out the oropharynx. The
20-French pull-through gastrostomy was connected to the snare device
and pulled antegrade through the oropharynx down the esophagus into
the stomach and then through the percutaneous tract external to the
patient. The gastrostomy was assembled externally. Contrast
injection confirms position in the stomach. Several spot
radiographic images were obtained in various obliquities for
documentation. The patient tolerated procedure well without
immediate post procedural complication.
FINDINGS: After successful fluoroscopic guided placement, the gastrostomy tube
is appropriately positioned with internal disc against the ventral
aspect of the gastric lumen.
IMPRESSION: Successful fluoroscopic insertion of a 20-French pull-through
gastrostomy tube.

The gastrostomy may be used immediately for medication
administration and in 24 hrs for the initiation of feeds.

## 2019-01-02 MED ORDER — MIDAZOLAM HCL 2 MG/2ML IJ SOLN
INTRAMUSCULAR | Status: AC | PRN
  Administered 2019-01-02: 1 mg via INTRAVENOUS

## 2019-01-02 MED ORDER — LIDOCAINE HCL (PF) 1 % IJ SOLN
INTRAMUSCULAR | Status: AC | PRN
  Administered 2019-01-02: 10 mL

## 2019-01-02 MED ORDER — ENOXAPARIN SODIUM 40 MG/0.4ML ~~LOC~~ SOLN
40.0000 mg | Freq: Two times a day (BID) | SUBCUTANEOUS | Status: DC
  Administered 2019-01-03 – 2019-01-10 (×14): 40 mg via SUBCUTANEOUS
  Filled 2019-01-02 (×14): qty 0.4

## 2019-01-02 MED ORDER — FENTANYL CITRATE (PF) 100 MCG/2ML IJ SOLN
INTRAMUSCULAR | Status: AC
  Filled 2019-01-02: qty 2

## 2019-01-02 MED ORDER — MIDAZOLAM HCL 2 MG/2ML IJ SOLN
INTRAMUSCULAR | Status: AC
  Filled 2019-01-02: qty 2

## 2019-01-02 MED ORDER — IOHEXOL 300 MG/ML  SOLN
50.0000 mL | Freq: Once | INTRAMUSCULAR | Status: AC | PRN
  Administered 2019-01-02: 20 mL

## 2019-01-02 MED ORDER — GLUCAGON HCL RDNA (DIAGNOSTIC) 1 MG IJ SOLR
INTRAMUSCULAR | Status: AC
  Filled 2019-01-02: qty 1

## 2019-01-02 MED ORDER — LIDOCAINE HCL 1 % IJ SOLN
INTRAMUSCULAR | Status: AC
  Filled 2019-01-02: qty 20

## 2019-01-02 MED ORDER — GLUCAGON HCL (RDNA) 1 MG IJ SOLR
INTRAMUSCULAR | Status: AC | PRN
  Administered 2019-01-02: 1 mg via INTRAVENOUS

## 2019-01-02 MED ORDER — FENTANYL CITRATE (PF) 100 MCG/2ML IJ SOLN
INTRAMUSCULAR | Status: AC | PRN
  Administered 2019-01-02: 50 ug via INTRAVENOUS

## 2019-01-02 NOTE — Procedures (Signed)
Pre procedure Dx: Dysphagia Post Procedure Dx: Same  Successful fluoroscopic guided insertion of gastrostomy tube.   The gastrostomy tube may be used immediately for medications.   Tube feeds may be initiated in 24 hours as per the primary team.    EBL: None Complications: None immediate  Jay Avien Taha, MD Pager #: [phone]    

## 2019-01-02 NOTE — Progress Notes (Signed)
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 Prior to that he had a much better quality of life in terms of independence with ADLs. He presented on 12/18/2018 with severe obstipation, lethargy and vomiting with decreased urinary output.   Admitted with working diagnosis of subacute shock with lactic acidosis from aspiration pneumonia, metabolic encephalopathy and ileus.  Hospital course complicated by  -persistent lactic acidosis -Wernicke's encephalopathy in setting of severe thiamine deficiency, confirmed on MRI 11/16 (given rapid decline in cognition and gait in addition to new onset of peripheral neuropathic symptoms since the onset of intractable N/V) -Adynamic ileus and hepatic steatosis, 11/28 on Ct abd/pelvis -GBS treated with IVIG.   -Intubation for respiratory failure in setting of sepsis and inability to clear secretions (11/27 intubation, extubated 11/30) -Pressor support for hypotension, off on 11/30 -Frequent need for deep suctioning due to inability to clear secretions   Subjective  Mr.  Shaff today has no acute events overnight. Opens eyes nonpurposefully for me but looks comfortable.  A & P  Acute on chronic hypoxemic respiratory failure. Inability to clear secretions due to poor mentation.  Currently being treated for aspiration pna. Hx of OSA, intolerant of CPAP.  -Last evaluated by PCCM on 12/7 and very high risk for  reintubation -monitor in SDU, requiring frequent deep suctioning and Chest PT -high aspiration risk, NPO  Aspiration pneumonia.  Sepsis physiology resolved.  Blood cultures remain negative.  Still having low-grade fevers.  Completed previous course of cefepime, Flagyl (11/14-7/18, Unasyn 11/28-12/2) recent chest x-ray shows no overt opacities -Repeat blood cultures today -Continue Unasyn (restarted on 12/7 -Continue aspiration precautions as above -WBC normal  Dysphagia Decreased mentation, declining food, high aspiration risk -s/p IR PEG tube on 12/8 -start TF  Ascending weakness and metabolic encephalopathy, secondary to GBS + Wernicke's encephalopathy/B1 deficiency. Completed 5 day IVIG course and high dose thiamine -continue thiamine 100 mg daily and multivitamin  Epilepsy history,stable. EEG neg for epileptic activity.  -Continue keppra and onfi, sz precautions -followed by [facility] -[facility] follow up provided per family request on discharge  Adynamic Ileus. Resolved on Abd xr. GI assisted with care. No long on magnesuim and erythromycin - maintain normal K and mg  Goals of care. Came in as hospice care patient and mother retracted that status. I had a 1 hour conversation with mother today regarding her son's prolonged hospital stay, his poor overall prognosis and shared medical team concern for his inability to tolerate an acute clinical decompensation. Most of conversation focused on the high probability that he would be vent dependent if that intervention was pursued again. She explains that is no way she wants him to be because that is not living but still needs time to process. She asks me to tell her the truth as she fears he will not make it out of the hospital. We also discussed changing to DNR but she would like to consider more -continue goals of care discussion -mother elects to continue full scope of treatment and full code currently -will  discuss having palliative  involvement ( last seen 12/3) to aid in this complicated patient and family dynamic  Hypothyroidism, stable -Continue Synthroid  Hypertension, stable  -Continue lisinopril, metoprolol    Family Communication  :  Mom updated at bedside  Code Status :  Full  Disposition Plan  :  Close monitoring progressive unit for deep suctioning for respiratory status, monitor neuro status  Consults  : Neurology, GI, PCCM, palliative  Procedures  :   10/16 NG tube placed per interventional radiology 11/15 Midline > 11/20 lumbar puncture 11/27 endotracheal intubation > 11/30 11/27 central line placement, right IJ > 12/1*  DVT Prophylaxis  :  Lovenox   Lab Results  Component Value Date   PLT 411 (H) 01/02/2019    Diet :  Diet Order            Diet NPO time specified  Diet effective now               Inpatient Medications Scheduled Meds:  chlorhexidine gluconate (MEDLINE KIT)  15 mL Mouth Rinse BID   Chlorhexidine Gluconate Cloth  6 each Topical Q0600   cloBAZam  60 mg Per Tube BID   enoxaparin (LOVENOX) injection  40 mg Subcutaneous Q12H   feeding supplement (PRO-STAT SUGAR FREE 64)  60 mL Per Tube BID   fentaNYL       glucagon (human recombinant)       levothyroxine  25 mcg Per Tube Q0600   lidocaine       lisinopril  40 mg Per NG tube Daily   mouth rinse  15 mL Mouth Rinse q12n4p   metoprolol tartrate  125 mg Per NG tube BID   midazolam       multivitamin  15 mL Per Tube Daily   nystatin   Topical Daily   pantoprazole sodium  40 mg Per Tube BID   potassium chloride  40 mEq Per Tube Daily   sodium chloride flush  10-40 mL Intracatheter Q12H   sodium chloride flush  10-40 mL Intracatheter Q12H   tamsulosin  0.4 mg Oral Daily   thiamine injection  100 mg Intravenous Daily   vitamin B-12  1,000 mcg Per Tube Daily   Continuous Infusions:  ampicillin-sulbactam (UNASYN) IV 3 g (01/02/19 0432)   feeding supplement (VITAL 1.5 CAL) 1,000 mL (01/01/19  1158)   levETIRAcetam 1,000 mg (01/02/19 1028)   PRN Meds:.acetaminophen (TYLENOL) oral liquid 160 mg/5 mL, fentaNYL (SUBLIMAZE) injection, ibuprofen, ipratropium-albuterol, labetalol, lip balm, LORazepam, metoprolol tartrate, ondansetron (ZOFRAN) IV, sodium chloride flush, sodium chloride flush, white petrolatum  Antibiotics  :   Anti-infectives (From admission, onward)   Start     Dose/Rate Route Frequency Ordered Stop   12/31/18 1100  Ampicillin-Sulbactam (UNASYN) 3 g in sodium chloride 0.9 % 100 mL IVPB     3 g 200 mL/hr over 30 Minutes Intravenous Every 6 hours 12/31/18 1000 01/05/19 1059   12/22/18 0900  ampicillin-sulbactam (UNASYN) 1.5 g in sodium chloride 0.9 % 100 mL IVPB  Status:  Discontinued     1.5 g 200 mL/hr over 30 Minutes Intravenous Every 6 hours 12/22/18 0851 12/22/18 0857   12/22/18 0900  Ampicillin-Sulbactam (UNASYN) 3 g in sodium chloride 0.9 % 100 mL IVPB     3 g 200 mL/hr over 30 Minutes Intravenous Every 6 hours 12/22/18 0857 12/26/18 2105   12/19/18 1400  erythromycin (EES) 400 MG/5ML suspension 500 mg  Status:  Discontinued     500 mg Per  Tube Every 8 hours 12/19/18 1132 12/30/18 1013   12/18/18 1500  erythromycin 500 mg in sodium chloride 0.9 % 100 mL IVPB  Status:  Discontinued     500 mg 100 mL/hr over 60 Minutes Intravenous Every 8 hours 12/18/18 1449 12/19/18 1132   12/15/18 1400  erythromycin (EES) 400 MG/5ML suspension 400 mg  Status:  Discontinued     400 mg Per Tube Every 8 hours 12/15/18 1012 12/15/18 1130   12/15/18 1400  erythromycin 500 mg in sodium chloride 0.9 % 100 mL IVPB  Status:  Discontinued     500 mg 100 mL/hr over 60 Minutes Intravenous Every 8 hours 12/15/18 1130 12/18/18 1449   12/09/18 0600  vancomycin (VANCOCIN) 1,250 mg in sodium chloride 0.9 % 250 mL IVPB  Status:  Discontinued     1,250 mg 166.7 mL/hr over 90 Minutes Intravenous Every 12 hours 12/18/2018 1539 12/10/18 1043   12/09/18 0000  ceFEPIme (MAXIPIME) 2 g in sodium  chloride 0.9 % 100 mL IVPB  Status:  Discontinued     2 g 200 mL/hr over 30 Minutes Intravenous Every 8 hours 12/02/2018 1539 12/11/18 1801   12/09/18 0000  metroNIDAZOLE (FLAGYL) IVPB 500 mg  Status:  Discontinued     500 mg 100 mL/hr over 60 Minutes Intravenous Every 8 hours 11/27/2018 2146 12/11/18 1801   12/23/2018 1415  ceFEPIme (MAXIPIME) 2 g in sodium chloride 0.9 % 100 mL IVPB     2 g 200 mL/hr over 30 Minutes Intravenous  Once 12/09/2018 1411 12/07/2018 1538   12/02/2018 1415  metroNIDAZOLE (FLAGYL) IVPB 500 mg     500 mg 100 mL/hr over 60 Minutes Intravenous  Once 12/23/2018 1411 12/17/2018 1609   12/07/2018 1415  vancomycin (VANCOCIN) IVPB 1000 mg/200 mL premix  Status:  Discontinued     1,000 mg 200 mL/hr over 60 Minutes Intravenous  Once 11/25/2018 1411 12/22/2018 1414   12/14/2018 1415  vancomycin (VANCOCIN) 2,500 mg in sodium chloride 0.9 % 500 mL IVPB     2,500 mg 250 mL/hr over 120 Minutes Intravenous  Once 11/26/2018 1414 12/02/2018 1710       Objective   Vitals:   01/02/19 0925 01/02/19 0950 01/02/19 0955 01/02/19 1015  BP: (!) 105/57 (!) 143/77 126/68 (!) 114/58  Pulse: (!) 111 (!) 112 (!) 105 (!) 104  Resp: '20 19 18 19  '$ Temp:      TempSrc:      SpO2: 100% 100% 100% 100%  Weight:      Height:        SpO2: 100 % O2 Flow Rate (L/min): 3 L/min FiO2 (%): 30 %  Wt Readings from Last 3 Encounters:  01/01/19 (!) 140.3 kg  11/21/18 (!) 148.8 kg  11/08/18 (!) 148.8 kg     Intake/Output Summary (Last 24 hours) at 01/02/2019 1033 Last data filed at 01/01/2019 2354 Gross per 24 hour  Intake 269 ml  Output 1425 ml  Net -1156 ml    Physical Exam:  Obese, young male, opening eyes nonpurposefully Not following commands Diffuse rhonchi, without respiratory distress No new F.N deficits,  .AT, No JVD RRR,No Gallops,Rubs or new Murmurs,  Abdominal binder in place, diminished bowel sounds  I have personally reviewed the following:   Data Reviewed:  CBC Recent Labs  Lab  12/29/18 0953 12/30/18 0641 12/31/18 0648 01/01/19 0641 01/02/19 0426  WBC 13.0* 17.4* 19.3* 9.8 7.8  HGB 11.0* 11.2* 11.4* 10.0* 9.5*  HCT 34.2* 35.6* 37.0* 33.3* 31.1*  PLT 897* 947* 671* 460* 411*  MCV 87.7 90.4 93.4 93.8 92.3  MCH 28.2 28.4 28.8 28.2 28.2  MCHC 32.2 31.5 30.8 30.0 30.5  RDW 17.7* 18.2* 18.5* 17.7* 17.3*    Chemistries  Recent Labs  Lab 12/29/18 0541 12/30/18 0641 12/31/18 0648 01/01/19 0641 01/02/19 0426  NA 134* 136 142 144 145  K 5.0 4.5 4.0 3.4* 3.4*  CL 96* 97* 104 105 104  CO2 '26 26 28 27 28  '$ GLUCOSE 98 113* 96 97 103*  BUN 13 21* '17 12 14  '$ CREATININE 0.34* 0.49* 0.44* 0.40* 0.40*  CALCIUM 9.4 9.3 9.8 9.4 9.6  MG  --   --   --  2.2  --    ------------------------------------------------------------------------------------------------------------------ No results for input(s): CHOL, HDL, LDLCALC, TRIG, CHOLHDL, LDLDIRECT in the last 72 hours.  No results found for: HGBA1C ------------------------------------------------------------------------------------------------------------------ No results for input(s): TSH, T4TOTAL, T3FREE, THYROIDAB in the last 72 hours.  Invalid input(s): FREET3 ------------------------------------------------------------------------------------------------------------------ No results for input(s): VITAMINB12, FOLATE, FERRITIN, TIBC, IRON, RETICCTPCT in the last 72 hours.  Coagulation profile No results for input(s): INR, PROTIME in the last 168 hours.  No results for input(s): DDIMER in the last 72 hours.  Cardiac Enzymes No results for input(s): CKMB, TROPONINI, MYOGLOBIN in the last 168 hours.  Invalid input(s): CK ------------------------------------------------------------------------------------------------------------------    Component Value Date/Time   BNP 67.3 12/10/2018 0930    Micro Results Recent Results (from the past 240 hour(s))  C difficile quick scan w PCR reflex     Status: None    Collection Time: 12/30/18 11:50 AM   Specimen: STOOL  Result Value Ref Range Status   C Diff antigen NEGATIVE NEGATIVE Final   C Diff toxin NEGATIVE NEGATIVE Final   C Diff interpretation No C. difficile detected.  Final    Comment: Performed at [facility], [place] [address], [place], [place] [address]    Radiology Reports Dg Abd 1 View  Result Date: 12/30/2018 CLINICAL DATA:  Reason for exam: Vomiting, aspiration into airway Hx HTN, pre-diabetes, fatty liver, pancreatitis, pneumonia EXAM: ABDOMEN - 1 VIEW COMPARISON:  12/25/2018 FINDINGS: Feeding tube tip in the distal duodenum. Stomach appears decompressed. Multiple dilated small bowel loops in the mid abdomen, increased since previous. Scattered gas in the nondilated colon. CLINICAL DATA:  Reason for exam: Vomiting, aspiration into airway Hx HTN, pre-diabetes, fatty liver, pancreatitis, pneumonia EXAM: ABDOMEN - 1 VIEW COMPARISON:  12/25/2018 FINDINGS: Feeding tube tip in the distal duodenum. Stomach appears decompressed. Multiple dilated small bowel loops in the mid abdomen, increased since previous. Scattered gas in the nondilated colon. IMPRESSION: 1. Multiple dilated small bowel loops in the mid abdomen, increased since previous, suggesting obstruction or developing ileus. 2. Feeding tube tip in the distal duodenum.  Stomach  decompressed. Electronically Signed   By: Lucrezia Europe M.D.   On: 12/30/2018 09:43   Dg Abd 1 View  Result Date: 12/24/2018 CLINICAL DATA:  Nasogastric tube placement. EXAM: ABDOMEN - 1 VIEW COMPARISON:  December 21, 2018. FINDINGS: The bowel gas pattern is normal. Nasogastric tube tip is seen in expected position of third portion of duodenum. No radio-opaque calculi or other significant radiographic abnormality are seen. IMPRESSION: Nasogastric tube tip seen in expected position of third portion of duodenum. No evidence of bowel obstruction or ileus. Electronically Signed   By: Marijo Conception M.D.   On: 12/24/2018  14:39   Dg Abd 1 View  Result Date: 12/16/2018 CLINICAL DATA:  Ileus. EXAM:  ABDOMEN - 1 VIEW COMPARISON:  None. FINDINGS: There is a dilated loop of small bowel in the mid abdomen measuring approximately 3.6 cm. There is no obvious pneumatosis or free air. The patient's enteric tube is outside the field of view. IMPRESSION: Mildly dilated loop of small bowel in the mid abdomen is nonspecific and could represent an ileus or partial small bowel obstruction the appropriate clinical setting. Electronically Signed   By: Constance Holster M.D.   On: 12/16/2018 15:04   Dg Abd 1 View  Result Date: 12/14/2018 CLINICAL DATA:  Nasogastric tube advancement. EXAM: ABDOMEN - 1 VIEW COMPARISON:  One view abdomen 12/12/2018 and 12/10/2018. CT 12/14/2018. FINDINGS: 0936 hours. Two views are submitted. The tip of the endotracheal tube is unchanged, overlying the mid stomach. The stomach and colon are mildly distended with gas. No significant small bowel distention identified. There is no evidence of free intraperitoneal air. Atelectasis is present at both lung bases. IMPRESSION: Stable location of the nasogastric tube. No evidence of bowel obstruction. Electronically Signed   By: Richardean Sale M.D.   On: 12/14/2018 09:53   Dg Abd 1 View  Result Date: 12/10/2018 CLINICAL DATA:  23 year old male status post NG tube placement. EXAM: ABDOMEN - 1 VIEW COMPARISON:  Abdominal radiograph dated 12/10/2018. FINDINGS: Partially visualized enteric tube with tip and side-port over the gastric air. The osseous structures and soft tissues appear unremarkable. IMPRESSION: Enteric tube with tip and side-port over the gastric air. Electronically Signed   By: Anner Crete M.D.   On: 12/10/2018 14:41   Ct Head Wo Contrast  Result Date: 11/29/2018 CLINICAL DATA:  Altered level of consciousness EXAM: CT HEAD WITHOUT CONTRAST TECHNIQUE: Contiguous axial images were obtained from the base of the skull through the vertex without  intravenous contrast. COMPARISON:  11/03/2018 FINDINGS: Brain: The brainstem, cerebellum, cerebral peduncles, thalami, basal ganglia, basilar cisterns, and ventricular system appear within normal limits. No intracranial hemorrhage, mass lesion, or acute CVA. Vascular: Unremarkable Skull: Unremarkable Sinuses/Orbits: Mild chronic right frontal sinusitis. Other: No supplemental non-categorized findings. IMPRESSION: 1. No significant intracranial abnormality is observed. 2. Mild chronic right frontal sinusitis. Electronically Signed   By: Van Clines M.D.   On: 12/03/2018 19:02   Ct Thoracic Spine Wo Contrast  Result Date: 12/14/2018 CLINICAL DATA:  Myelopathy, acute or progressive EXAM: CT THORACIC SPINE WITHOUT CONTRAST TECHNIQUE: Multidetector CT images of the thoracic were obtained using the standard protocol without intravenous contrast. COMPARISON:  None. FINDINGS: Alignment: Normal Vertebrae: Normal vertebra.  No fracture or mass. Paraspinal and other soft tissues: Negative for paraspinous mass or adenopathy. NG tube in place Left lower lobe small infiltrate most likely pneumonia. Fatty liver with hepatomegaly. Disc levels: Disc spaces are well preserved. No significant disc degeneration or spurring. Negative for spinal stenosis. IMPRESSION: Negative CT thoracic spine Left lower lobe infiltrate, probable pneumonia Electronically Signed   By: Franchot Gallo M.D.   On: 12/14/2018 20:48   Ct Lumbar Spine Wo Contrast  Result Date: 12/14/2018 CLINICAL DATA:  Myelopathy, acute or progressive. EXAM: CT LUMBAR SPINE WITHOUT CONTRAST TECHNIQUE: Multidetector CT imaging of the lumbar spine was performed without intravenous contrast administration. Multiplanar CT image reconstructions were also generated. COMPARISON:  None. FINDINGS: Segmentation: Normal Alignment: Normal Vertebrae: Negative for fracture or mass.  Normal vertebra. Paraspinal and other soft tissues: Negative for paraspinous mass or  adenopathy. No soft tissue edema. Disc levels: Disc spaces maintained. No significant disc degeneration or spinal stenosis. IMPRESSION: Negative CT lumbar spine. Electronically Signed  By: Franchot Gallo M.D.   On: 12/14/2018 20:51   Mr Jeri Cos RX Contrast  Result Date: 12/10/2018 CLINICAL DATA:  Slowly progressive ataxia.  History of seizures. EXAM: MRI HEAD WITHOUT AND WITH CONTRAST TECHNIQUE: Multiplanar, multiecho pulse sequences of the brain and surrounding structures were obtained without and with intravenous contrast. CONTRAST:  55m GADAVIST GADOBUTROL 1 MMOL/ML IV SOLN COMPARISON:  Head CT 11/26/2018 FINDINGS: Brain: There is considerable motion degradation. There is abnormal T2 signal and restricted diffusion in both medial thalami. The most likely explanation is Wernicke encephalopathy. The remainder of the brain appears normal. No evidence of ischemic infarction, mass lesion, hemorrhage, hydrocephalus or extra-axial collection. No abnormal contrast enhancement is demonstrated. Vascular: Major vessels at the base of the brain show flow. Skull and upper cervical spine: Negative Sinuses/Orbits: Clear/normal Other: None IMPRESSION: Abnormal T2 signal and restricted diffusion in both medial thalami. 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 Electronically Signed   By: MNelson ChimesM.D.   On: 12/10/2018 17:16   Mr Cervical Spine W Wo Contrast  Result Date: 12/10/2018 CLINICAL DATA:  Slowly progressive ataxia and seizures. EXAM: MRI CERVICAL SPINE WITHOUT AND WITH CONTRAST TECHNIQUE: Multiplanar and multiecho pulse sequences of the cervical spine, to include the craniocervical junction and cervicothoracic junction, were obtained without and with intravenous contrast. CONTRAST:  113mGADAVIST GADOBUTROL 1 MMOL/ML IV SOLN COMPARISON:  None. FINDINGS: The study suffers from considerable motion degradation. Alignment: Normal Vertebrae: Normal Cord: Normal Posterior Fossa, vertebral arteries,  paraspinal tissues: See results of brain MRI. Paraspinal regions negative. Disc levels: Normal IMPRESSION: Motion degraded exam.  No abnormality seen in the cervical region. Electronically Signed   By: MaNelson Chimes.D.   On: 12/10/2018 17:20   Mr Thoracic Spine W Wo Contrast  Result Date: 12/19/2018 CLINICAL DATA:  Quadriplegia EXAM: MRI THORACIC AND LUMBAR SPINE WITHOUT AND WITH CONTRAST TECHNIQUE: Multiplanar and multiecho pulse sequences of the thoracic and lumbar spine were obtained without and with intravenous contrast. CONTRAST:  1065mADAVIST GADOBUTROL 1 MMOL/ML IV SOLN COMPARISON:  CT thoracic lumbar 12/14/2018 FINDINGS: MRI THORACIC SPINE FINDINGS Alignment:  Normal. Image quality degraded by moderate to extensive motion. Vertebrae: Negative for fracture or mass. Normal enhancement of the spine. Cord: Limited cord evaluation due to motion. No cord lesion or cord compression identified. Paraspinal and other soft tissues: Negative Disc levels: Negative for disc protrusion or spinal stenosis. MRI LUMBAR SPINE FINDINGS Segmentation:  Normal Motion degraded study. There is progressive motion on the study which becomes more severe on the axial images. Alignment:  Normal Vertebrae:  Negative for fracture or mass. Conus medullaris and cauda equina: Conus extends to the T12-L1 level. Conus and cauda equina appear normal. Paraspinal and other soft tissues: Negative for paraspinous mass or adenopathy. Normal soft tissue enhancement. Disc levels: No significant abnormality at L3-4 or above Mild disc bulging and mild facet degeneration L4-5 and L5-S1. Negative for disc protrusion or stenosis IMPRESSION: Motion degraded study, worse on the thoracic compared to the lumbar spine. No acute thoracic abnormality. Negative for fracture, infection, or spinal stenosis. No acute lumbar abnormality. Mild disc and facet degeneration L4-5 and L5-S1 without stenosis. Electronically Signed   By: ChaFranchot GalloD.   On:  12/19/2018 12:05   Mr Lumbar Spine W Wo Contrast  Result Date: 12/19/2018 CLINICAL DATA:  Quadriplegia EXAM: MRI THORACIC AND LUMBAR SPINE WITHOUT AND WITH CONTRAST TECHNIQUE: Multiplanar and multiecho pulse sequences of the thoracic and lumbar spine were obtained without and with  intravenous contrast. CONTRAST:  59m GADAVIST GADOBUTROL 1 MMOL/ML IV SOLN COMPARISON:  CT thoracic lumbar 12/14/2018 FINDINGS: MRI THORACIC SPINE FINDINGS Alignment:  Normal. Image quality degraded by moderate to extensive motion. Vertebrae: Negative for fracture or mass. Normal enhancement of the spine. Cord: Limited cord evaluation due to motion. No cord lesion or cord compression identified. Paraspinal and other soft tissues: Negative Disc levels: Negative for disc protrusion or spinal stenosis. MRI LUMBAR SPINE FINDINGS Segmentation:  Normal Motion degraded study. There is progressive motion on the study which becomes more severe on the axial images. Alignment:  Normal Vertebrae:  Negative for fracture or mass. Conus medullaris and cauda equina: Conus extends to the T12-L1 level. Conus and cauda equina appear normal. Paraspinal and other soft tissues: Negative for paraspinous mass or adenopathy. Normal soft tissue enhancement. Disc levels: No significant abnormality at L3-4 or above Mild disc bulging and mild facet degeneration L4-5 and L5-S1. Negative for disc protrusion or stenosis IMPRESSION: Motion degraded study, worse on the thoracic compared to the lumbar spine. No acute thoracic abnormality. Negative for fracture, infection, or spinal stenosis. No acute lumbar abnormality. Mild disc and facet degeneration L4-5 and L5-S1 without stenosis. Electronically Signed   By: CFranchot GalloM.D.   On: 12/19/2018 12:05   Ct Abdomen Pelvis W Contrast  Result Date: 12/22/2018 CLINICAL DATA:  Persistent ileus. EXAM: CT ABDOMEN AND PELVIS WITH CONTRAST TECHNIQUE: Multidetector CT imaging of the abdomen and pelvis was performed  using the standard protocol following bolus administration of intravenous contrast. CONTRAST:  1067mOMNIPAQUE IOHEXOL 300 MG/ML  SOLN COMPARISON:  11/30/2018 FINDINGS: Lower chest: Normal heart size. Airspace disease in both lower lungs, most confluent in the medial left lower lobe. Hepatobiliary: Hepatic steatosis which is diffuse and prominent.No evidence of biliary obstruction or stone. Pancreas: Unremarkable. Spleen: Unremarkable. Adrenals/Urinary Tract: Negative adrenals. No hydronephrosis or stone. Bladder is decompressed by Foley catheter. Stomach/Bowel: Fluid levels seen throughout small and large bowel with moderate distention of bowel loops. A rectal tube is in place. Maximal colonic thickening is at the ascending segment-8.4 cm. No bowel wall thickening. New high-density within the appendix attributed to barium retention. There is mild widening of the midportion appendix that is stable, no periappendiceal inflammation. Enteric tube tip in good position at the proximal stomach. Vascular/Lymphatic: No acute vascular abnormality. No mass or adenopathy. Reproductive:Negative Other: No ascites or pneumoperitoneum. Musculoskeletal: No acute abnormalities. IMPRESSION: 1. Adynamic ileus pattern that has progressed from 12/07/2018. 2. Hepatic steatosis. 3. Pneumonia both lung bases, progressed from comparison. Electronically Signed   By: JoMonte Fantasia.D.   On: 12/22/2018 15:42   Ct Abdomen Pelvis W Contrast  Result Date: 11/28/2018 CLINICAL DATA:  Abdominal pain. Shortness of breath. EXAM: CT ABDOMEN AND PELVIS WITH CONTRAST TECHNIQUE: Multidetector CT imaging of the abdomen and pelvis was performed using the standard protocol following bolus administration of intravenous contrast. CONTRAST:  12029mMNIPAQUE IOHEXOL 350 MG/ML SOLN COMPARISON:  11/24/2018 FINDINGS: Lower chest: New airspace opacity in the left lower lobe suspicious for pneumonia. Air fluid level in the distal esophagus probably from  reflux. Hepatobiliary: Diffuse hepatic steatosis is suspected. Otherwise unremarkable. Pancreas: Unremarkable Spleen: Unremarkable Adrenals/Urinary Tract: Unremarkable Stomach/Bowel: There are a few mildly dilated loops of small bowel measuring up to 3.5 cm in diameter, with scattered air-fluid levels but without an obvious transition point or site of obstruction. Vascular/Lymphatic: Borderline prominent right gastric node 0.9 cm in short axis on image 24/3, previously 0.8 cm. Patient has an unusually small in  caliber abdominal aorta, measuring 0.9 cm in diameter. Common origin of the celiac trunk and SMA. No obvious periaortic inflammatory findings. Reproductive: Unremarkable Other: No supplemental non-categorized findings. Musculoskeletal: Unremarkable IMPRESSION: 1. New airspace opacity in the left lower lobe suspicious for pneumonia. 2. There are a few mildly dilated loops of small bowel with scattered air-fluid levels but without an obvious transition point or site of obstruction. Ileus is favored over low-grade partial small bowel obstruction. 3. Diffuse hepatic steatosis. 4. Air fluid level in the distal esophagus probably from reflux. 5. Small caliber abdominal aorta, only about 0.9 cm in diameter, without periaortic inflammatory findings. 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 Consider follow up vascular surgical consultation. Electronically Signed   By: Van Clines M.D.   On: 12/01/2018 19:15   Ir Gastrostomy Tube Mod Sed  Result Date: 01/02/2019 INDICATION: Dysphagia. Please place percutaneous gastrostomy tube for enteric nutrition supplementation purposes. EXAM: PULL TROUGH GASTROSTOMY TUBE PLACEMENT COMPARISON:  CT abdomen pelvis-12/22/2018 MEDICATIONS: Patient is currently admitted to the hospital and receiving intravenous antibiotics; Antibiotics were administered within 1 hour of the procedure. Glucagon 1 mg IV CONTRAST:  30 mL of Isovue-300 administered into the gastric lumen.  ANESTHESIA/SEDATION: Moderate (conscious) sedation was employed during this procedure. A total of Versed 1 mg and Fentanyl 50 mcg was administered intravenously. Moderate Sedation Time: 10 minutes. The patient's level of consciousness and vital signs were monitored continuously by radiology nursing throughout the procedure under my direct supervision. FLUOROSCOPY TIME:  3 minutes, 30 seconds (89 mGy) COMPLICATIONS: None immediate. PROCEDURE: Informed written consent was obtained from the patient following explanation of the procedure, risks, benefits and alternatives. A time out was performed prior to the initiation of the procedure. Ultrasound scanning was performed to demarcate the edge of the left lobe of the liver. Maximal barrier sterile technique utilized including caps, mask, sterile gowns, sterile gloves, large sterile drape, hand hygiene and Betadine prep. The left upper quadrant was sterilely prepped and draped. An oral gastric catheter was inserted into the stomach under fluoroscopy. The existing nasogastric feeding tube was removed. The left costal margin and barium/air opacified transverse colon were identified and avoided. Air was injected into the stomach for insufflation and visualization under fluoroscopy. Under sterile conditions a 17 gauge trocar needle was utilized to access the stomach percutaneously beneath the left subcostal margin after the overlying soft tissues were anesthetized with 1% Lidocaine with epinephrine. Needle position was confirmed within the stomach with aspiration of air and injection of small amount of contrast. A single T tack was deployed for gastropexy. Over an Amplatz guide wire, a 9-French sheath was inserted into the stomach. A snare device was utilized to capture the oral gastric catheter. The snare device was pulled retrograde from the stomach up the esophagus and out the oropharynx. The 20-French pull-through gastrostomy was connected to the snare device and pulled  antegrade through the oropharynx down the esophagus into the stomach and then through the percutaneous tract external to the patient. The gastrostomy was assembled externally. Contrast injection confirms position in the stomach. Several spot radiographic images were obtained in various obliquities for documentation. The patient tolerated procedure well without immediate post procedural complication. FINDINGS: After successful fluoroscopic guided placement, the gastrostomy tube is appropriately positioned with internal disc against the ventral aspect of the gastric lumen. IMPRESSION: Successful fluoroscopic insertion of a 20-French pull-through gastrostomy tube. The gastrostomy may be used immediately for medication administration and in 24 hrs for the initiation of feeds. Electronically Signed  By: Sandi Mariscal M.D.   On: 01/02/2019 10:14   Dg Chest Port 1 View  Result Date: 12/31/2018 CLINICAL DATA:  23 year old male with a history aspiration EXAM: PORTABLE CHEST 1 VIEW COMPARISON:  12/30/2018, 12/28/2018 FINDINGS: Cardiomediastinal silhouette unchanged in size and contour. Low lung volumes persist with linear opacities at the lung bases. No pneumothorax or large pleural effusion. Crowding of the interstitium and the vasculature. Enteric tube projects over the mediastinum and terminates out of the field of view. IMPRESSION: Unchanged appearance of the chest x-ray with low lung volumes and likely atelectasis. Unchanged enteric feeding tube. Electronically Signed   By: Corrie Mckusick D.O.   On: 12/31/2018 07:45   Dg Chest Port 1 View  Result Date: 12/30/2018 CLINICAL DATA:  Reason for exam: Vomiting, aspiration into airway Hx HTN, pre-diabetes, fatty liver, pancreatitis, pneumonia EXAM: PORTABLE CHEST - 1 VIEW COMPARISON:  12/28/2018 FINDINGS: Feeding tube remains in place. Persistent low lung volumes with coarse perihilar and bibasilar interstitial markings. No new infiltrate or edema. Heart size and  mediastinal contours are within normal limits. No effusion. Visualized bones unremarkable. IMPRESSION: 1. Stable chest x-ray. No new infiltrate or edema. 2. Stable appearance of the cardiomediastinal silhouette. Electronically Signed   By: Lucrezia Europe M.D.   On: 12/30/2018 09:42   Dg Chest Port 1 View  Result Date: 12/28/2018 CLINICAL DATA:  Updated status.  Fever. EXAM: PORTABLE CHEST 1 VIEW COMPARISON:  December 26, 2018 FINDINGS: The feeding tube terminates below today's film. Mild opacity in left base is somewhat streaky in platelike laterally. Low lung volumes. No pneumothorax. The lungs are otherwise clear. The cardiomediastinal silhouette is stable. IMPRESSION: Streaky opacity in left base is favored to represent atelectasis given the platelike configuration laterally. Developing infiltrate not completely excluded. Recommend attention on follow-up. 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 EXAM: PORTABLE CHEST 1 VIEW COMPARISON:  Portable chest 12/21/2018. FINDINGS: Portable AP semi upright view at 0052 hours. Extubated and NG type tube removed since 12/21/2018. Lower lung volumes. Mediastinal contours remain normal. There is an enteric feeding tube in place which courses to the abdomen and probably crosses midline to the right but the tip is not included. Increased crowding of lung markings and/or pulmonary vascularity without overt edema. No pneumothorax, pleural effusion or consolidation. Stable visible bowel gas pattern. IMPRESSION: 1. Extubated with lower lung volumes. No other acute cardiopulmonary abnormality. 2. Enteric feeding tube courses to the abdomen, tip not included. Electronically Signed   By: Genevie Ann M.D.   On: 12/26/2018 01:08   Dg Chest Port 1 View  Result Date: 12/21/2018 CLINICAL DATA:  23 year old male with  history of central line and nasogastric tube placement. EXAM: PORTABLE CHEST 1 VIEW COMPARISON:  Chest x-ray 12/21/2018. FINDINGS: An endotracheal tube is in place with tip 4.1 cm above the carina. There is a right-sided internal jugular central venous catheter with tip terminating in the distal superior vena cava. Nasogastric tube extending into the proximal stomach with side port just distal to the gastroesophageal junction. No pneumothorax. Lung volumes are low. Ill-defined opacities in the medial aspect of the left lung base and medial aspect of the right upper lobe. No pleural effusions. No evidence of pulmonary edema. Heart size is normal. IMPRESSION: 1. Support apparatus, as above. 2. Ill-defined opacities in the medial left  lower lobe and medial right upper lobe concerning for multilobar pneumonia. Electronically Signed   By: Vinnie Langton M.D.   On: 12/21/2018 17:22   Dg Chest Port 1 View  Result Date: 12/21/2018 CLINICAL DATA:  Aspiration, possible seizure EXAM: PORTABLE CHEST 1 VIEW COMPARISON:  12/20/2018 FINDINGS: No significant change in low volume AP portable examination without significant acute appearing airspace opacity. Esophagogastric tube remains with tip and side port below the diaphragm. Heart and mediastinum are unremarkable. IMPRESSION: No significant change in low volume portable chest x-ray. No new findings. Electronically Signed   By: Eddie Candle M.D.   On: 12/21/2018 11:32   Dg Chest Port 1 View  Result Date: 12/20/2018 CLINICAL DATA:  Fever. History of hypertension seizures and pancreatitis. EXAM: PORTABLE CHEST 1 VIEW COMPARISON:  12/16/2018 and older exams. FINDINGS: Lung volumes are low. Atelectasis in the medial lung bases. Lungs otherwise clear. No pleural effusion or pneumothorax. Cardiac silhouette normal in size.  No mediastinal or hilar masses. Nasogastric tube passes below the diaphragm well into the stomach. IMPRESSION: 1. No acute cardiopulmonary disease.  Electronically Signed   By: Lajean Manes M.D.   On: 12/20/2018 13:39   Dg Chest Port 1 View  Result Date: 12/16/2018 CLINICAL DATA:  Shortness of breath. EXAM: PORTABLE CHEST 1 VIEW COMPARISON:  December 15, 2018. FINDINGS: Again noted are low lung volumes with bibasilar atelectasis. The cardiac silhouette remains enlarged. There is no pneumothorax. No large pleural effusion. No acute osseous abnormality. An enteric tube is noted. IMPRESSION: Stable appearance of the chest. Electronically Signed   By: Constance Holster M.D.   On: 12/16/2018 15:02   Dg Chest Port 1 View  Result Date: 12/15/2018 CLINICAL DATA:  Respiratory failure. EXAM: PORTABLE CHEST 1 VIEW COMPARISON:  12/14/2018 FINDINGS: 0514 hours. Low lung volumes. Interval improvement in left basilar aeration. The lungs are clear without focal pneumonia, edema, pneumothorax or pleural effusion. Cardiopericardial silhouette accentuated by low volume AP technique. NG tube tip is in the mid stomach. Telemetry leads overlie the chest. IMPRESSION: Low volume film without acute cardiopulmonary findings. Electronically Signed   By: Misty Stanley M.D.   On: 12/15/2018 07:37   Dg Chest Port 1 View  Result Date: 12/14/2018 CLINICAL DATA:  Shortness of breath. EXAM: PORTABLE CHEST 1 VIEW COMPARISON:  Chest radiograph 12/10/2018, abdominal radiograph 12/12/2018 FINDINGS: Enteric tube is been retracted, tip now in the distal esophagus, side-port in the mid esophagus. Low lung volumes similar to prior exam. Slight increasing patchy opacities at the left lung base. Unchanged heart size and mediastinal contours. No pleural fluid or pneumothorax. IMPRESSION: 1. Enteric tube has been retracted, tip now in the distal esophagus, side-port in the mid esophagus. Recommend advancement of least 10 cm place the side-port below the diaphragm. 2. Persistent low lung volumes. Increasing patchy opacities at the left lung base, may be atelectasis or pneumonia.  Electronically Signed   By: Keith Rake M.D.   On: 12/14/2018 05:22   Dg Chest Port 1 View  Result Date: 12/10/2018 CLINICAL DATA:  Dyspnea. EXAM: PORTABLE CHEST 1 VIEW COMPARISON:  December 09, 2018. FINDINGS: The heart size and mediastinal contours are within normal limits. No pneumothorax or pleural effusion is noted. Hypoinflation of the lungs is noted with minimal bibasilar subsegmental atelectasis. The visualized skeletal structures are unremarkable. IMPRESSION: Hypoinflation of the lungs with minimal bibasilar subsegmental atelectasis. Electronically Signed   By: Marijo Conception M.D.   On: 12/10/2018 09:17   Dg Chest Port 1  View  Result Date: 12/09/2018 CLINICAL DATA:  Tachypnea, low-grade fever EXAM: PORTABLE CHEST 1 VIEW COMPARISON:  12/02/2018 FINDINGS: Low lung volumes. Heart size is accentuated by the low volumes. Bibasilar atelectasis. No effusions. No acute bony abnormality. IMPRESSION: Low lung volumes, bibasilar atelectasis. Electronically Signed   By: Rolm Baptise M.D.   On: 12/09/2018 21:38   Dg Chest Port 1 View  Result Date: 12/20/2018 CLINICAL DATA:  Sepsis. EXAM: PORTABLE CHEST 1 VIEW COMPARISON:  11/21/2018 FINDINGS: A very poor inspiration is again demonstrated. The heart remains grossly normal in size. Interval minimal patchy and linear density in the left lower lobe. Clear right lung. Mild peribronchial thickening. Unremarkable bones. IMPRESSION: 1. Interval minimal patchy and linear atelectasis or pneumonia in the left lower lobe. 2. Mild bronchitic changes. 3. Stable very poor inspiration. Electronically Signed   By: Claudie Revering M.D.   On: 11/29/2018 15:47   Dg Abd Portable 1v  Result Date: 01/02/2019 CLINICAL DATA:  Severe sepsis. EXAM: PORTABLE ABDOMEN - 1 VIEW COMPARISON:  12/30/2018 FINDINGS: Air is present throughout the colon. Contrast is present over the right colon, terminal ileum and appendix. Small caliber probe projects over the rectum. Enteric tube is  present with tip in the midline of the mid abdomen likely over the distal duodenum near the duodenal jejunal junction. Remainder the exam is unchanged IMPRESSION: 1.  Nonobstructive bowel gas pattern as described. 2. Enteric tube with tip unchanged just left of midline in the upper abdomen likely over the third/fourth portion of the duodenum. Electronically Signed   By: Marin Olp M.D.   On: 01/02/2019 08:03   Dg Abd Portable 1v  Result Date: 12/30/2018 CLINICAL DATA:  Ileus EXAM: PORTABLE ABDOMEN - 1 VIEW COMPARISON:  Portable exam 1715 hours compared to 0906 hours FINDINGS: Tip of feeding tube projects over the third portion of duodenum approaching ligament of Treitz. Scattered gas throughout bowel, decreased from prior exam. No bowel dilatation or bowel wall thickening. Osseous structures unremarkable. No urinary tract calcifications. IMPRESSION: Nonspecific bowel gas pattern. Decreased gaseous distention of bowel since prior study. Electronically Signed   By: Lavonia Dana M.D.   On: 12/30/2018 19:20   Dg Abd Portable 1v  Result Date: 12/25/2018 CLINICAL DATA:  Feeding tube placement. EXAM: PORTABLE ABDOMEN - 1 VIEW COMPARISON:  December 24, 2018. FINDINGS: The bowel gas pattern is normal. Distal tip of feeding tube is seen in expected position of proximal duodenum. No radio-opaque calculi or other significant radiographic abnormality are seen. IMPRESSION: Negative. Electronically Signed   By: Marijo Conception M.D.   On: 12/25/2018 16:06   Dg Abd Portable 1v  Result Date: 12/21/2018 CLINICAL DATA:  Nasogastric tube placement. EXAM: PORTABLE ABDOMEN - 1 VIEW COMPARISON:  December 20, 2018. FINDINGS: Mildly dilated air-filled large and small bowel loops are noted which may represent ileus. Distal tip of nasogastric tube is seen in proximal stomach. IMPRESSION: Distal tip of nasogastric tube seen in proximal stomach. Mildly dilated air-filled large and small bowel loops are noted which may represent  ileus. Electronically Signed   By: Marijo Conception M.D.   On: 12/21/2018 17:22   Dg Abd Portable 1v  Result Date: 12/20/2018 CLINICAL DATA:  NG placement EXAM: PORTABLE ABDOMEN - 1 VIEW COMPARISON:  Radiograph 12/20/2018 FINDINGS: Transesophageal tube tip and side port distal to the GE junction, curling in the left upper quadrant in the region of the gastric body. Atelectatic changes in the otherwise clear lung bases. Gaseous distention of what  appears to be the colon without high-grade bowel obstruction. IMPRESSION: 1. Transesophageal tube tip and side port in the region of the gastric body. 2. Gaseous distention of the colon without high-grade bowel obstruction. Electronically Signed   By: Lovena Le M.D.   On: 12/20/2018 02:45   Dg Abd Portable 1v  Result Date: 12/20/2018 CLINICAL DATA:  Check gastric catheter placement EXAM: PORTABLE ABDOMEN - 1 VIEW COMPARISON:  12/20/2018 FINDINGS: Gastric catheter has been advanced with the tip in the stomach. Proximal side port lies at the gastroesophageal junction. This could be advanced several cm. The lungs are clear. IMPRESSION: Gastric catheter as described. The proximal side port is not within the stomach and should be advanced several cm. Electronically Signed   By: Inez Catalina M.D.   On: 12/20/2018 01:50   Dg Abd Portable 1v  Result Date: 12/20/2018 CLINICAL DATA:  NG tube placement EXAM: PORTABLE ABDOMEN - 1 VIEW COMPARISON:  12/16/2018 abdominal radiograph FINDINGS: The side port of the NG tube is in the distal esophagus. Recommend advancing by 7-10 cm. Lungs are clear. Nonobstructive bowel gas pattern. IMPRESSION: 1. Side-port of the nasogastric tube in the distal esophagus. Recommend advancing by 7-10 cm. 2. No active cardiopulmonary disease. Electronically Signed   By: Ulyses Jarred M.D.   On: 12/20/2018 00:41   Dg Abd Portable 1v  Result Date: 12/12/2018 CLINICAL DATA:  NG tube placement. EXAM: PORTABLE ABDOMEN - 1 VIEW COMPARISON:   Radiograph dated 12/10/2018 FINDINGS: NG tube has been inserted and the tip is in the midbody of the stomach. The visualized bowel gas pattern is normal. No visible acute bone abnormality. IMPRESSION: NG tube in the body of the stomach. Electronically Signed   By: Lorriane Shire M.D.   On: 12/12/2018 21:18   Dg Abd Portable 1v  Result Date: 12/10/2018 CLINICAL DATA:  Dyspnea, abdominal pain and distention EXAM: PORTABLE ABDOMEN - 1 VIEW COMPARISON:  12/04/2018 abdominal radiograph FINDINGS: There are mildly dilated small bowel loops throughout the abdomen measuring up to 3.9 cm diameter in the left abdomen, mildly decreased in caliber. No evidence of pneumatosis or pneumoperitoneum. No radiopaque nephrolithiasis. IMPRESSION: Diffuse mild small bowel dilatation, mildly decreased, which could represent either improving adynamic ileus or improving partial distal small bowel obstruction. Electronically Signed   By: Ilona Sorrel M.D.   On: 12/10/2018 09:19   Dg Abd Portable 1v  Result Date: 12/09/2018 CLINICAL DATA:  Nasogastric tube placement. EXAM: PORTABLE ABDOMEN - 1 VIEW COMPARISON:  12/05/2018 FINDINGS: No nasogastric tube seen in the lower chest or upper abdomen. Multiple dilated small bowel loops with mild improvement. Gas is again demonstrated in normal caliber colon. Mild scoliosis. IMPRESSION: 1. No nasogastric tube seen in the lower chest or upper abdomen. 2. Mildly improved small bowel obstruction. Electronically Signed   By: Claudie Revering M.D.   On: 12/09/2018 11:40   Dg Abd Portable 1v  Result Date: 12/23/2018 CLINICAL DATA:  Sepsis. Fever. EXAM: PORTABLE ABDOMEN - 1 VIEW COMPARISON:  11/23/2018 and abdomen and pelvis CT dated 11/24/2018. FINDINGS: Interval multiple mildly dilated loops of proximal small bowel with gas-filled normal caliber distal small bowel and colon. Unremarkable bones. IMPRESSION: Interval mild small bowel ileus or partial obstruction. No gross free air seen at this time.  Electronically Signed   By: Claudie Revering M.D.   On: 11/26/2018 15:49   Vas [place] Lower Extremity Venous (dvt)  Result Date: 12/22/2018  Lower Venous Study Indications: Edema.  Risk Factors: None identified. Limitations: Body  habitus and poor ultrasound/tissue interface. Comparison Study: No prior studies. Performing Technologist: Oliver Hum RVT  Examination Guidelines: A complete evaluation includes B-mode imaging, spectral Doppler, color Doppler, and power Doppler as needed of all accessible portions of each vessel. Bilateral testing is considered an integral part of a complete examination. Limited examinations for reoccurring indications may be performed as noted.  +---------+---------------+---------+-----------+----------+--------------+  RIGHT     Compressibility Phasicity Spontaneity Properties Thrombus Aging  +---------+---------------+---------+-----------+----------+--------------+  CFV       Full            Yes       Yes                                    +---------+---------------+---------+-----------+----------+--------------+  SFJ       Full                                                             +---------+---------------+---------+-----------+----------+--------------+  FV Prox   Full                                                             +---------+---------------+---------+-----------+----------+--------------+  FV Mid    Full                                                             +---------+---------------+---------+-----------+----------+--------------+  FV Distal Full                                                             +---------+---------------+---------+-----------+----------+--------------+  PFV       Full                                                             +---------+---------------+---------+-----------+----------+--------------+  POP       Full            Yes       Yes                                     +---------+---------------+---------+-----------+----------+--------------+  PTV       Full                                                             +---------+---------------+---------+-----------+----------+--------------+  PERO      Full                                                             +---------+---------------+---------+-----------+----------+--------------+   +---------+---------------+---------+-----------+----------+--------------+  LEFT      Compressibility Phasicity Spontaneity Properties Thrombus Aging  +---------+---------------+---------+-----------+----------+--------------+  CFV       Full            Yes       Yes                                    +---------+---------------+---------+-----------+----------+--------------+  SFJ       Full                                                             +---------+---------------+---------+-----------+----------+--------------+  FV Prox   Full                                                             +---------+---------------+---------+-----------+----------+--------------+  FV Mid    Full                                                             +---------+---------------+---------+-----------+----------+--------------+  FV Distal Full                                                             +---------+---------------+---------+-----------+----------+--------------+  PFV       Full                                                             +---------+---------------+---------+-----------+----------+--------------+  POP       Full            Yes       Yes                                    +---------+---------------+---------+-----------+----------+--------------+  PTV       Full                                                             +---------+---------------+---------+-----------+----------+--------------+  PERO      Full                                                              +---------+---------------+---------+-----------+----------+--------------+     Summary: Right: There is no evidence of deep vein thrombosis in the lower extremity. No cystic structure found in the popliteal fossa. Left: There is no evidence of deep vein thrombosis in the lower extremity. No cystic structure found in the popliteal fossa.  *See table(s) above for measurements and observations. Electronically signed by Curt Jews MD on 12/22/2018 at 9:30:31 AM.    Final    [place] Ekg Site Rite  Result Date: 12/31/2018 If Site Rite image not attached, placement could not be confirmed due to current cardiac rhythm.  Dg Fl Guided Lumbar Puncture  Result Date: 12/14/2018 CLINICAL DATA:  Lower extremity weakness, decreased PO intake, dehydration, concern for Guillain-Barre syndrome EXAM: DIAGNOSTIC LUMBAR PUNCTURE UNDER FLUOROSCOPIC GUIDANCE FLUOROSCOPY TIME:  Fluoroscopy Time: 72 seconds Radiation Exposure Index (if provided by the fluoroscopic device): 395.4 mGy Number of Acquired Spot Images: 3 PROCEDURE: Informed consent was obtained from the patient prior to the procedure, including potential complications of headache, allergy, and pain. With the patient prone, the lower back was prepped with Betadine. 1% Lidocaine was used for local anesthesia. Lumbar puncture was performed at the L5-S1 level using a 20 gauge needle with return of initially blood tinged, subsequently clear CSF. Extremely low opening pressure, which was not measured. 10.5 ml of CSF were obtained for laboratory studies. The patient tolerated the procedure well and there were no apparent complications. During the procedure, the patient's NG tube started to withdraw. This was returned to the original position in the gastric cardia and advanced slightly into the mid body of the stomach. IMPRESSION: Successful fluoroscopic guided lumbar puncture at L5-S1. Patient's NG tube readjusted, as described above. Electronically Signed   By: Julian Hy  M.D.   On: 12/14/2018 17:19     Time Spent in minutes  60     Desiree Hane M.D on 01/02/2019 at 10:33 AM  To page go to [email or web address] - password [email or web address]

## 2019-01-02 NOTE — Progress Notes (Signed)
RT NOTE: CPT not done at this time as patient is leaving with transport to go to IR. CPT will resume at next scheduled time. Vitals are stable. RT will continue to monitor.

## 2019-01-02 NOTE — Progress Notes (Signed)
RT did not due CPT at this time with patient. Patient just received meds and has a history of vomiting during CPT. RT will do CPT with patient next rounds. RN is aware.

## 2019-01-03 DIAGNOSIS — Z789 Other specified health status: Secondary | ICD-10-CM

## 2019-01-03 LAB — BASIC METABOLIC PANEL
Anion gap: 10 (ref 5–15)
BUN: 10 mg/dL (ref 6–20)
CO2: 28 mmol/L (ref 22–32)
Calcium: 9.2 mg/dL (ref 8.9–10.3)
Chloride: 106 mmol/L (ref 98–111)
Creatinine, Ser: <0.3 mg/dL — ABNORMAL LOW (ref 0.61–1.24)
Glucose, Bld: 106 mg/dL — ABNORMAL HIGH (ref 70–99)
Potassium: 2.8 mmol/L — ABNORMAL LOW (ref 3.5–5.1)
Sodium: 144 mmol/L (ref 135–145)

## 2019-01-03 LAB — CBC
HCT: 32.2 % — ABNORMAL LOW (ref 39.0–52.0)
Hemoglobin: 9.9 g/dL — ABNORMAL LOW (ref 13.0–17.0)
MCH: 28.2 pg (ref 26.0–34.0)
MCHC: 30.7 g/dL (ref 30.0–36.0)
MCV: 91.7 fL (ref 80.0–100.0)
Platelets: 413 10*3/uL — ABNORMAL HIGH (ref 150–400)
RBC: 3.51 MIL/uL — ABNORMAL LOW (ref 4.22–5.81)
RDW: 17 % — ABNORMAL HIGH (ref 11.5–15.5)
WBC: 9.5 10*3/uL (ref 4.0–10.5)
nRBC: 0 % (ref 0.0–0.2)

## 2019-01-03 LAB — MAGNESIUM: Magnesium: 1.8 mg/dL (ref 1.7–2.4)

## 2019-01-03 MED ORDER — POTASSIUM CHLORIDE 20 MEQ/15ML (10%) PO SOLN
40.0000 meq | Freq: Once | ORAL | Status: AC
  Administered 2019-01-03: 40 meq via ORAL
  Filled 2019-01-03: qty 30

## 2019-01-03 NOTE — Progress Notes (Signed)
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 Prior to that he had a much better quality of life in terms of independence with ADLs. He presented on 12/07/2018 with severe obstipation, lethargy and vomiting with decreased urinary output.   Admitted with working diagnosis of subacute shock with lactic acidosis from aspiration pneumonia, metabolic encephalopathy and ileus.  Hospital course complicated by  -persistent lactic acidosis -Wernicke's encephalopathy in setting of severe thiamine deficiency, confirmed on MRI 11/16 (given rapid decline in cognition and gait in addition to new onset of peripheral neuropathic symptoms since the onset of intractable N/V) -Adynamic ileus and hepatic steatosis, 11/28 on Ct abd/pelvis -GBS treated with IVIG.   -Intubation for respiratory failure in setting of sepsis and inability to clear secretions (11/27 intubation, extubated 11/30) -Pressor support for hypotension, off on 11/30 -Frequent need for deep suctioning due to inability to clear secretions   Subjective  Mr.  Vega today has no acute events overnight. Opens eyes nonpurposefully for me but looks comfortable.  A & P  Acute on chronic hypoxemic respiratory failure. Inability to clear secretions due to poor mentation, multifactorial neurologic weakness.  Currently being treated for aspiration pna. Hx of OSA, intolerant of CPAP.  -Last evaluated by PCCM  on 12/7 and very high risk for reintubation, discussed with mother today she elects for DNI but to continue full scope of treatment -monitor in SDU, requiring frequent deep suctioning and Chest PT -high aspiration risk, NPO  Aspiration pneumonia.  Sepsis physiology resolved.  Blood cultures remain negative.  Afebrile over last 24 hours.  Completed previous course of cefepime, Flagyl (11/14-7/18, Unasyn 11/28-12/2) recent chest x-ray shows no overt opacities -Repeat blood cultures unremarkable so far -Continue Unasyn (restarted on 12/7 -Continue aspiration precautions as above -WBC normal  Hypokalemia.  On daily potassium here but missed dose yesterday while holding use of newly inserted PEG tube -Check mag, continue 40 mEq daily potassium, add additional 40 mEq x 1 -Monitor BMP daily  Dysphagia Decreased mentation, declining food, high aspiration risk -s/p IR PEG tube on 12/8 -start TF at low rate and increase to goal rate slowly while monitoring  Ascending weakness and metabolic encephalopathy, secondary to GBS + Wernicke's encephalopathy/B1 deficiency. Completed 5 day IVIG course and high dose thiamine -continue thiamine 100 mg daily and multivitamin  Epilepsy history,stable. EEG neg for epileptic activity.  -Continue keppra and onfi, sz precautions -followed by [facility] -[facility] follow up provided per family request on discharge  Adynamic Ileus. Resolved on Abd xr. GI assisted with care. No long on magnesuim and erythromycin - maintain normal K and mg  Goals of care. Came in as hospice care patient and mother retracted that status.  I again had a 40-minute conversation with mother today regarding her son's prolonged hospital stay and overall poor prognosis.  She notes that he looks clinically worse today and elects to not pursue intubation if his respiratory status worsens.  She would also like to discuss with palliative care to help assist with further goals of care discussions.  She explains that is no way she wants him to be because that is not living but still needs time to process. She asks me to tell her the truth as she fears he will not make it out of the hospital. -Palliative care team to be made aware (following peripherally 12/5) -Continue goals of care discussions   Hypothyroidism, stable -Continue Synthroid  Hypertension, stable  -Continue lisinopril, metoprolol    Family Communication  :  Mom updated at bedside  Code Status :  Partial/Full scope/Do not intubate  Disposition Plan  :  Close monitoring progressive unit for deep suctioning for respiratory status, monitor neuro status  Consults  : Neurology, GI, PCCM, palliative  Procedures  :   10/16 NG tube placed per interventional radiology 11/15 Midline > 11/20 lumbar puncture 11/27 endotracheal intubation > 11/30 11/27 central line placement, right IJ > 12/1*  DVT Prophylaxis  :  Lovenox   Lab Results  Component Value Date   PLT 413 (H) 01/03/2019    Diet :  Diet Order            Diet NPO time specified  Diet effective now               Inpatient Medications Scheduled Meds: . chlorhexidine gluconate (MEDLINE KIT)  15 mL Mouth Rinse BID  . Chlorhexidine Gluconate Cloth  6 each Topical Q0600  . cloBAZam  60 mg Per Tube BID  . enoxaparin (LOVENOX) injection  40 mg Subcutaneous Q12H  . feeding supplement (PRO-STAT SUGAR FREE 64)  60 mL Per Tube BID  . levothyroxine  25 mcg Per Tube Q0600  . lisinopril  40 mg Per NG tube Daily  . mouth rinse  15 mL Mouth Rinse q12n4p  . metoprolol tartrate  125 mg Per NG tube BID  . multivitamin  15 mL Per Tube Daily  . nystatin   Topical Daily  . pantoprazole sodium  40 mg Per Tube BID  . potassium chloride  40 mEq Per Tube Daily  . sodium chloride flush  10-40 mL Intracatheter Q12H  . sodium chloride flush  10-40 mL Intracatheter Q12H  . tamsulosin  0.4 mg Oral Daily  . thiamine injection  100 mg Intravenous Daily  . vitamin B-12   1,000 mcg Per Tube Daily   Continuous Infusions: . ampicillin-sulbactam (UNASYN) IV 3 g (01/03/19 1308)  . feeding supplement (VITAL 1.5 CAL) 1,000 mL (01/01/19 1158)  . levETIRAcetam 1,000 mg (01/03/19 1011)   PRN Meds:.acetaminophen (TYLENOL) oral liquid 160 mg/5 mL, fentaNYL (SUBLIMAZE) injection, ibuprofen, ipratropium-albuterol, labetalol, lip balm, LORazepam, metoprolol tartrate, ondansetron (ZOFRAN) IV, sodium chloride flush, sodium chloride flush, white petrolatum  Antibiotics  :   Anti-infectives (From admission, onward)   Start     Dose/Rate Route Frequency Ordered Stop   12/31/18 1100  Ampicillin-Sulbactam (UNASYN) 3 g in sodium chloride 0.9 % 100 mL IVPB     3 g 200 mL/hr over 30 Minutes Intravenous Every 6 hours 12/31/18 1000 01/05/19 1259   12/22/18 0900  ampicillin-sulbactam (UNASYN) 1.5 g in sodium chloride 0.9 % 100 mL IVPB  Status:  Discontinued     1.5 g 200 mL/hr over 30 Minutes Intravenous Every 6 hours 12/22/18 0851 12/22/18 0857   12/22/18 0900  Ampicillin-Sulbactam (UNASYN) 3 g in sodium chloride 0.9 % 100 mL IVPB     3 g 200 mL/hr over 30 Minutes Intravenous Every 6 hours 12/22/18 0857 12/26/18 2105   12/19/18 1400  erythromycin (EES) 400 MG/5ML suspension  500 mg  Status:  Discontinued     500 mg Per Tube Every 8 hours 12/19/18 1132 12/30/18 1013   12/18/18 1500  erythromycin 500 mg in sodium chloride 0.9 % 100 mL IVPB  Status:  Discontinued     500 mg 100 mL/hr over 60 Minutes Intravenous Every 8 hours 12/18/18 1449 12/19/18 1132   12/15/18 1400  erythromycin (EES) 400 MG/5ML suspension 400 mg  Status:  Discontinued     400 mg Per Tube Every 8 hours 12/15/18 1012 12/15/18 1130   12/15/18 1400  erythromycin 500 mg in sodium chloride 0.9 % 100 mL IVPB  Status:  Discontinued     500 mg 100 mL/hr over 60 Minutes Intravenous Every 8 hours 12/15/18 1130 12/18/18 1449   12/09/18 0600  vancomycin (VANCOCIN) 1,250 mg in sodium chloride 0.9 % 250 mL IVPB  Status:   Discontinued     1,250 mg 166.7 mL/hr over 90 Minutes Intravenous Every 12 hours 11/30/2018 1539 12/10/18 1043   12/09/18 0000  ceFEPIme (MAXIPIME) 2 g in sodium chloride 0.9 % 100 mL IVPB  Status:  Discontinued     2 g 200 mL/hr over 30 Minutes Intravenous Every 8 hours 12/22/2018 1539 12/11/18 1801   12/09/18 0000  metroNIDAZOLE (FLAGYL) IVPB 500 mg  Status:  Discontinued     500 mg 100 mL/hr over 60 Minutes Intravenous Every 8 hours 12/12/2018 2146 12/11/18 1801   11/28/2018 1415  ceFEPIme (MAXIPIME) 2 g in sodium chloride 0.9 % 100 mL IVPB     2 g 200 mL/hr over 30 Minutes Intravenous  Once 12/17/2018 1411 12/14/2018 1538   12/23/2018 1415  metroNIDAZOLE (FLAGYL) IVPB 500 mg     500 mg 100 mL/hr over 60 Minutes Intravenous  Once 12/09/2018 1411 12/05/2018 1609   11/26/2018 1415  vancomycin (VANCOCIN) IVPB 1000 mg/200 mL premix  Status:  Discontinued     1,000 mg 200 mL/hr over 60 Minutes Intravenous  Once 12/01/2018 1411 12/04/2018 1414   12/13/2018 1415  vancomycin (VANCOCIN) 2,500 mg in sodium chloride 0.9 % 500 mL IVPB     2,500 mg 250 mL/hr over 120 Minutes Intravenous  Once 12/20/2018 1414 12/15/2018 1710       Objective   Vitals:   01/03/19 0804 01/03/19 0823 01/03/19 1151 01/03/19 1208  BP: (!) 140/93 (!) 140/93  113/60  Pulse: 94 93 98 95  Resp: _0 Temp: (!) 97.3 F (36.3 C)   99.3 F (37.4 C)  TempSrc: Axillary   Rectal  SpO2: 100% 100% 100% 100%  Weight:      Height:        SpO2: 100 % O2 Flow Rate (L/min): 2 L/min FiO2 (%): 28 %  Wt Readings from Last 3 Encounters:  01/03/19 (!) 137 kg  11/21/18 (!) 148.8 kg  11/08/18 (!) 148.8 kg     Intake/Output Summary (Last 24 hours) at 01/03/2019 1519 Last data filed at 01/03/2019 0617 Gross per 24 hour  Intake 310 ml  Output 1025 ml  Net -715 ml    Physical Exam:  Obese, young male, opening eyes nonpurposefully Not following commands Diffuse rhonchi, without respiratory distress No new F.N deficits,  [place].AT, RRR,No  Gallops,Rubs or new Murmurs,  Abdominal binder in place, diminished bowel sounds  I have personally reviewed the following:   Data Reviewed:  CBC Recent Labs  Lab 12/30/18 0641 12/31/18 0648 01/01/19 0641 01/02/19 0426 01/03/19 0817  WBC 17.4* 19.3* 9.8 7.8 9.5  HGB 11.2* 11.4* 10.0* 9.5* 9.9*  HCT 35.6* 37.0* 33.3* 31.1* 32.2*  PLT 947* 671* 460* 411* 413*  MCV 90.4 93.4 93.8 92.3 91.7  MCH 28.4 28.8 28.2 28.2 28.2  MCHC 31.5 30.8 30.0 30.5 30.7  RDW 18.2* 18.5* 17.7* 17.3* 17.0*    Chemistries  Recent Labs  Lab 12/30/18 0641 12/31/18 0648 01/01/19 0641 01/02/19 0426 01/03/19 0817  NA 136 142 144 145 144  K 4.5 4.0 3.4* 3.4* 2.8*  CL 97* 104 105 104 106  CO2 _0 GLUCOSE 113* 96 97 103* 106*  BUN 21* _1 CREATININE 0.49* 0.44* 0.40* 0.40* <0.30*  CALCIUM 9.3 9.8 9.4 9.6 9.2  MG  --   --  2.2  --   --    ------------------------------------------------------------------------------------------------------------------ No results for input(s): CHOL, HDL, LDLCALC, TRIG, CHOLHDL, LDLDIRECT in the last 72 hours.  No results found for: HGBA1C ------------------------------------------------------------------------------------------------------------------ No results for input(s): TSH, T4TOTAL, T3FREE, THYROIDAB in the last 72 hours.  Invalid input(s): FREET3 ------------------------------------------------------------------------------------------------------------------ No results for input(s): VITAMINB12, FOLATE, FERRITIN, TIBC, IRON, RETICCTPCT in the last 72 hours.  Coagulation profile No results for input(s): INR, PROTIME in the last 168 hours.  No results for input(s): DDIMER in the last 72 hours.  Cardiac Enzymes No results for input(s): CKMB, TROPONINI, MYOGLOBIN in the last 168 hours.  Invalid input(s): CK ------------------------------------------------------------------------------------------------------------------      Component Value Date/Time   BNP 67.3 12/10/2018 0930    Micro Results Recent Results (from the past 240 hour(s))  C difficile quick scan w PCR reflex     Status: None   Collection Time: 12/30/18 11:50 AM   Specimen: STOOL  Result Value Ref Range Status   C Diff antigen NEGATIVE NEGATIVE Final   C Diff toxin NEGATIVE NEGATIVE Final   C Diff interpretation No C. difficile detected.  Final    Comment: Performed at [facility], [place] [address], [place], [place] [address]  Culture, blood (routine x 2)     Status: None (Preliminary result)   Collection Time: 01/02/19  9:03 AM   Specimen: BLOOD LEFT HAND  Result Value Ref Range Status   Specimen Description BLOOD LEFT HAND  Final   Special Requests   Final    BOTTLES DRAWN AEROBIC ONLY Blood Culture results may not be optimal due to an inadequate volume of blood received in culture bottles   Culture   Final    NO GROWTH 1 DAY Performed at [facility], [place] [address], [place], [place] [address]    Report Status PENDING  Incomplete  Culture, blood (routine x 2)     Status: None (Preliminary result)   Collection Time: 01/02/19  1:29 PM   Specimen: BLOOD  Result Value Ref Range Status   Specimen Description BLOOD LEFT FOOT  Final   Special Requests   Final    BOTTLES DRAWN AEROBIC ONLY Blood Culture adequate volume   Culture   Final    NO GROWTH < 24 HOURS Performed at [facility], [place] [address] [place],  [address]    Report Status PENDING  Incomplete    Radiology Reports DG Abd 1 View  Result Date: 12/30/2018 CLINICAL DATA:  Reason for exam: Vomiting, aspiration into airway Hx HTN, pre-diabetes, fatty liver, pancreatitis, pneumonia EXAM: ABDOMEN - 1 VIEW COMPARISON:  12/25/2018 FINDINGS: Feeding tube tip in the distal duodenum. Stomach appears decompressed. Multiple dilated small bowel loops in  the mid abdomen, increased since previous. Scattered gas in the nondilated colon. CLINICAL DATA:  Reason for  exam: Vomiting, aspiration into airway Hx HTN, pre-diabetes, fatty liver, pancreatitis, pneumonia EXAM: ABDOMEN - 1 VIEW COMPARISON:  12/25/2018 FINDINGS: Feeding tube tip in the distal duodenum. Stomach appears decompressed. Multiple dilated small bowel loops in the mid abdomen, increased since previous. Scattered gas in the nondilated colon. IMPRESSION: 1. Multiple dilated small bowel loops in the mid abdomen, increased since previous, suggesting obstruction or developing ileus. 2. Feeding tube tip in the distal duodenum.  Stomach  decompressed. Electronically Signed   By: Lucrezia Europe M.D.   On: 12/30/2018 09:43   DG Abd 1 View  Result Date: 12/24/2018 CLINICAL DATA:  Nasogastric tube placement. EXAM: ABDOMEN - 1 VIEW COMPARISON:  December 21, 2018. FINDINGS: The bowel gas pattern is normal. Nasogastric tube tip is seen in expected position of third portion of duodenum. No radio-opaque calculi or other significant radiographic abnormality are seen. IMPRESSION: Nasogastric tube tip seen in expected position of third portion of duodenum. No evidence of bowel obstruction or ileus. Electronically Signed   By: Marijo Conception M.D.   On: 12/24/2018 14:39   DG Abd 1 View  Result Date: 12/16/2018 CLINICAL DATA:  Ileus. EXAM: ABDOMEN - 1 VIEW COMPARISON:  None. FINDINGS: There is a dilated loop of small bowel in the mid abdomen measuring approximately 3.6 cm. There is no obvious pneumatosis or free air. The patient's enteric tube is outside the field of view. IMPRESSION: Mildly dilated loop of small bowel in the mid abdomen is nonspecific and could represent an ileus or partial small bowel obstruction the appropriate clinical setting. Electronically Signed   By: Constance Holster M.D.   On: 12/16/2018 15:04   DG Abd 1 View  Result Date: 12/14/2018 CLINICAL DATA:  Nasogastric tube advancement. EXAM: ABDOMEN - 1 VIEW COMPARISON:  One view abdomen 12/12/2018 and 12/10/2018. CT 11/29/2018. FINDINGS: 0936 hours.  Two views are submitted. The tip of the endotracheal tube is unchanged, overlying the mid stomach. The stomach and colon are mildly distended with gas. No significant small bowel distention identified. There is no evidence of free intraperitoneal air. Atelectasis is present at both lung bases. IMPRESSION: Stable location of the nasogastric tube. No evidence of bowel obstruction. Electronically Signed   By: Richardean Sale M.D.   On: 12/14/2018 09:53   DG Abd 1 View  Result Date: 12/10/2018 CLINICAL DATA:  23 year old male status post NG tube placement. EXAM: ABDOMEN - 1 VIEW COMPARISON:  Abdominal radiograph dated 12/10/2018. FINDINGS: Partially visualized enteric tube with tip and side-port over the gastric air. The osseous structures and soft tissues appear unremarkable. IMPRESSION: Enteric tube with tip and side-port over the gastric air. Electronically Signed   By: Anner Crete M.D.   On: 12/10/2018 14:41   CT Head Wo Contrast  Result Date: 12/14/2018 CLINICAL DATA:  Altered level of consciousness EXAM: CT HEAD WITHOUT CONTRAST TECHNIQUE: Contiguous axial images were obtained from the base of the skull through the vertex without intravenous contrast. COMPARISON:  11/03/2018 FINDINGS: Brain: The brainstem, cerebellum, cerebral peduncles, thalami, basal ganglia, basilar cisterns, and ventricular system appear within normal limits. No intracranial hemorrhage, mass lesion, or acute CVA. Vascular: Unremarkable Skull: Unremarkable Sinuses/Orbits: Mild chronic right frontal sinusitis. Other: No supplemental non-categorized findings. IMPRESSION: 1. No significant intracranial abnormality is observed. 2. Mild chronic right frontal sinusitis. Electronically Signed   By: Van Clines M.D.   On: 12/12/2018 19:02  CT THORACIC SPINE WO CONTRAST  Result Date: 12/14/2018 CLINICAL DATA:  Myelopathy, acute or progressive EXAM: CT THORACIC SPINE WITHOUT CONTRAST TECHNIQUE: Multidetector CT images of the  thoracic were obtained using the standard protocol without intravenous contrast. COMPARISON:  None. FINDINGS: Alignment: Normal Vertebrae: Normal vertebra.  No fracture or mass. Paraspinal and other soft tissues: Negative for paraspinous mass or adenopathy. NG tube in place Left lower lobe small infiltrate most likely pneumonia. Fatty liver with hepatomegaly. Disc levels: Disc spaces are well preserved. No significant disc degeneration or spurring. Negative for spinal stenosis. IMPRESSION: Negative CT thoracic spine Left lower lobe infiltrate, probable pneumonia Electronically Signed   By: Franchot Gallo M.D.   On: 12/14/2018 20:48   CT LUMBAR SPINE WO CONTRAST  Result Date: 12/14/2018 CLINICAL DATA:  Myelopathy, acute or progressive. EXAM: CT LUMBAR SPINE WITHOUT CONTRAST TECHNIQUE: Multidetector CT imaging of the lumbar spine was performed without intravenous contrast administration. Multiplanar CT image reconstructions were also generated. COMPARISON:  None. FINDINGS: Segmentation: Normal Alignment: Normal Vertebrae: Negative for fracture or mass.  Normal vertebra. Paraspinal and other soft tissues: Negative for paraspinous mass or adenopathy. No soft tissue edema. Disc levels: Disc spaces maintained. No significant disc degeneration or spinal stenosis. IMPRESSION: Negative CT lumbar spine. Electronically Signed   By: Franchot Gallo M.D.   On: 12/14/2018 20:51   MR BRAIN W WO CONTRAST  Result Date: 12/10/2018 CLINICAL DATA:  Slowly progressive ataxia.  History of seizures. EXAM: MRI HEAD WITHOUT AND WITH CONTRAST TECHNIQUE: Multiplanar, multiecho pulse sequences of the brain and surrounding structures were obtained without and with intravenous contrast. CONTRAST:  24m GADAVIST GADOBUTROL 1 MMOL/ML IV SOLN COMPARISON:  Head CT 12/06/2018 FINDINGS: Brain: There is considerable motion degradation. There is abnormal T2 signal and restricted diffusion in both medial thalami. The most likely explanation is  Wernicke encephalopathy. The remainder of the brain appears normal. No evidence of ischemic infarction, mass lesion, hemorrhage, hydrocephalus or extra-axial collection. No abnormal contrast enhancement is demonstrated. Vascular: Major vessels at the base of the brain show flow. Skull and upper cervical spine: Negative Sinuses/Orbits: Clear/normal Other: None IMPRESSION: Abnormal T2 signal and restricted diffusion in both medial thalami. 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 Electronically Signed   By: MNelson ChimesM.D.   On: 12/10/2018 17:16   MR CERVICAL SPINE W WO CONTRAST  Result Date: 12/10/2018 CLINICAL DATA:  Slowly progressive ataxia and seizures. EXAM: MRI CERVICAL SPINE WITHOUT AND WITH CONTRAST TECHNIQUE: Multiplanar and multiecho pulse sequences of the cervical spine, to include the craniocervical junction and cervicothoracic junction, were obtained without and with intravenous contrast. CONTRAST:  148mGADAVIST GADOBUTROL 1 MMOL/ML IV SOLN COMPARISON:  None. FINDINGS: The study suffers from considerable motion degradation. Alignment: Normal Vertebrae: Normal Cord: Normal Posterior Fossa, vertebral arteries, paraspinal tissues: See results of brain MRI. Paraspinal regions negative. Disc levels: Normal IMPRESSION: Motion degraded exam.  No abnormality seen in the cervical region. Electronically Signed   By: MaNelson Chimes.D.   On: 12/10/2018 17:20   MR THORACIC SPINE W WO CONTRAST  Result Date: 12/19/2018 CLINICAL DATA:  Quadriplegia EXAM: MRI THORACIC AND LUMBAR SPINE WITHOUT AND WITH CONTRAST TECHNIQUE: Multiplanar and multiecho pulse sequences of the thoracic and lumbar spine were obtained without and with intravenous contrast. CONTRAST:  1036mADAVIST GADOBUTROL 1 MMOL/ML IV SOLN COMPARISON:  CT thoracic lumbar 12/14/2018 FINDINGS: MRI THORACIC SPINE FINDINGS Alignment:  Normal. Image quality degraded by moderate to extensive motion. Vertebrae: Negative for fracture or mass.  Normal  enhancement of the spine. Cord: Limited cord evaluation due to motion. No cord lesion or cord compression identified. Paraspinal and other soft tissues: Negative Disc levels: Negative for disc protrusion or spinal stenosis. MRI LUMBAR SPINE FINDINGS Segmentation:  Normal Motion degraded study. There is progressive motion on the study which becomes more severe on the axial images. Alignment:  Normal Vertebrae:  Negative for fracture or mass. Conus medullaris and cauda equina: Conus extends to the T12-L1 level. Conus and cauda equina appear normal. Paraspinal and other soft tissues: Negative for paraspinous mass or adenopathy. Normal soft tissue enhancement. Disc levels: No significant abnormality at L3-4 or above Mild disc bulging and mild facet degeneration L4-5 and L5-S1. Negative for disc protrusion or stenosis IMPRESSION: Motion degraded study, worse on the thoracic compared to the lumbar spine. No acute thoracic abnormality. Negative for fracture, infection, or spinal stenosis. No acute lumbar abnormality. Mild disc and facet degeneration L4-5 and L5-S1 without stenosis. Electronically Signed   By: Franchot Gallo M.D.   On: 12/19/2018 12:05   MR Lumbar Spine W Wo Contrast  Result Date: 12/19/2018 CLINICAL DATA:  Quadriplegia EXAM: MRI THORACIC AND LUMBAR SPINE WITHOUT AND WITH CONTRAST TECHNIQUE: Multiplanar and multiecho pulse sequences of the thoracic and lumbar spine were obtained without and with intravenous contrast. CONTRAST:  21m GADAVIST GADOBUTROL 1 MMOL/ML IV SOLN COMPARISON:  CT thoracic lumbar 12/14/2018 FINDINGS: MRI THORACIC SPINE FINDINGS Alignment:  Normal. Image quality degraded by moderate to extensive motion. Vertebrae: Negative for fracture or mass. Normal enhancement of the spine. Cord: Limited cord evaluation due to motion. No cord lesion or cord compression identified. Paraspinal and other soft tissues: Negative Disc levels: Negative for disc protrusion or spinal stenosis. MRI  LUMBAR SPINE FINDINGS Segmentation:  Normal Motion degraded study. There is progressive motion on the study which becomes more severe on the axial images. Alignment:  Normal Vertebrae:  Negative for fracture or mass. Conus medullaris and cauda equina: Conus extends to the T12-L1 level. Conus and cauda equina appear normal. Paraspinal and other soft tissues: Negative for paraspinous mass or adenopathy. Normal soft tissue enhancement. Disc levels: No significant abnormality at L3-4 or above Mild disc bulging and mild facet degeneration L4-5 and L5-S1. Negative for disc protrusion or stenosis IMPRESSION: Motion degraded study, worse on the thoracic compared to the lumbar spine. No acute thoracic abnormality. Negative for fracture, infection, or spinal stenosis. No acute lumbar abnormality. Mild disc and facet degeneration L4-5 and L5-S1 without stenosis. Electronically Signed   By: CFranchot GalloM.D.   On: 12/19/2018 12:05   CT ABDOMEN PELVIS W CONTRAST  Result Date: 12/22/2018 CLINICAL DATA:  Persistent ileus. EXAM: CT ABDOMEN AND PELVIS WITH CONTRAST TECHNIQUE: Multidetector CT imaging of the abdomen and pelvis was performed using the standard protocol following bolus administration of intravenous contrast. CONTRAST:  1056mOMNIPAQUE IOHEXOL 300 MG/ML  SOLN COMPARISON:  12/09/2018 FINDINGS: Lower chest: Normal heart size. Airspace disease in both lower lungs, most confluent in the medial left lower lobe. Hepatobiliary: Hepatic steatosis which is diffuse and prominent.No evidence of biliary obstruction or stone. Pancreas: Unremarkable. Spleen: Unremarkable. Adrenals/Urinary Tract: Negative adrenals. No hydronephrosis or stone. Bladder is decompressed by Foley catheter. Stomach/Bowel: Fluid levels seen throughout small and large bowel with moderate distention of bowel loops. A rectal tube is in place. Maximal colonic thickening is at the ascending segment-8.4 cm. No bowel wall thickening. New high-density within  the appendix attributed to barium retention. There is mild widening of the midportion appendix that is  stable, no periappendiceal inflammation. Enteric tube tip in good position at the proximal stomach. Vascular/Lymphatic: No acute vascular abnormality. No mass or adenopathy. Reproductive:Negative Other: No ascites or pneumoperitoneum. Musculoskeletal: No acute abnormalities. IMPRESSION: 1. Adynamic ileus pattern that has progressed from 12/10/2018. 2. Hepatic steatosis. 3. Pneumonia both lung bases, progressed from comparison. Electronically Signed   By: Monte Fantasia M.D.   On: 12/22/2018 15:42   CT ABDOMEN PELVIS W CONTRAST  Result Date: 12/21/2018 CLINICAL DATA:  Abdominal pain. Shortness of breath. EXAM: CT ABDOMEN AND PELVIS WITH CONTRAST TECHNIQUE: Multidetector CT imaging of the abdomen and pelvis was performed using the standard protocol following bolus administration of intravenous contrast. CONTRAST:  167m OMNIPAQUE IOHEXOL 350 MG/ML SOLN COMPARISON:  11/24/2018 FINDINGS: Lower chest: New airspace opacity in the left lower lobe suspicious for pneumonia. Air fluid level in the distal esophagus probably from reflux. Hepatobiliary: Diffuse hepatic steatosis is suspected. Otherwise unremarkable. Pancreas: Unremarkable Spleen: Unremarkable Adrenals/Urinary Tract: Unremarkable Stomach/Bowel: There are a few mildly dilated loops of small bowel measuring up to 3.5 cm in diameter, with scattered air-fluid levels but without an obvious transition point or site of obstruction. Vascular/Lymphatic: Borderline prominent right gastric node 0.9 cm in short axis on image 24/3, previously 0.8 cm. Patient has an unusually small in caliber abdominal aorta, measuring 0.9 cm in diameter. Common origin of the celiac trunk and SMA. No obvious periaortic inflammatory findings. Reproductive: Unremarkable Other: No supplemental non-categorized findings. Musculoskeletal: Unremarkable IMPRESSION: 1. New airspace opacity in  the left lower lobe suspicious for pneumonia. 2. There are a few mildly dilated loops of small bowel with scattered air-fluid levels but without an obvious transition point or site of obstruction. Ileus is favored over low-grade partial small bowel obstruction. 3. Diffuse hepatic steatosis. 4. Air fluid level in the distal esophagus probably from reflux. 5. Small caliber abdominal aorta, only about 0.9 cm in diameter, without periaortic inflammatory findings. 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 Consider follow up vascular surgical consultation. Electronically Signed   By: WVan ClinesM.D.   On: 12/18/2018 19:15   IR GASTROSTOMY TUBE MOD SED  Result Date: 01/02/2019 INDICATION: Dysphagia. Please place percutaneous gastrostomy tube for enteric nutrition supplementation purposes. EXAM: PULL TROUGH GASTROSTOMY TUBE PLACEMENT COMPARISON:  CT abdomen pelvis-12/22/2018 MEDICATIONS: Patient is currently admitted to the hospital and receiving intravenous antibiotics; Antibiotics were administered within 1 hour of the procedure. Glucagon 1 mg IV CONTRAST:  30 mL of Isovue-300 administered into the gastric lumen. ANESTHESIA/SEDATION: Moderate (conscious) sedation was employed during this procedure. A total of Versed 1 mg and Fentanyl 50 mcg was administered intravenously. Moderate Sedation Time: 10 minutes. The patient's level of consciousness and vital signs were monitored continuously by radiology nursing throughout the procedure under my direct supervision. FLUOROSCOPY TIME:  3 minutes, 30 seconds (89 mGy) COMPLICATIONS: None immediate. PROCEDURE: Informed written consent was obtained from the patient following explanation of the procedure, risks, benefits and alternatives. A time out was performed prior to the initiation of the procedure. Ultrasound scanning was performed to demarcate the edge of the left lobe of the liver. Maximal barrier sterile technique utilized including caps, mask, sterile  gowns, sterile gloves, large sterile drape, hand hygiene and Betadine prep. The left upper quadrant was sterilely prepped and draped. An oral gastric catheter was inserted into the stomach under fluoroscopy. The existing nasogastric feeding tube was removed. The left costal margin and barium/air opacified transverse colon were identified and avoided. Air was injected into the stomach for insufflation and  visualization under fluoroscopy. Under sterile conditions a 17 gauge trocar needle was utilized to access the stomach percutaneously beneath the left subcostal margin after the overlying soft tissues were anesthetized with 1% Lidocaine with epinephrine. Needle position was confirmed within the stomach with aspiration of air and injection of small amount of contrast. A single T tack was deployed for gastropexy. Over an Amplatz guide wire, a 9-French sheath was inserted into the stomach. A snare device was utilized to capture the oral gastric catheter. The snare device was pulled retrograde from the stomach up the esophagus and out the oropharynx. The 20-French pull-through gastrostomy was connected to the snare device and pulled antegrade through the oropharynx down the esophagus into the stomach and then through the percutaneous tract external to the patient. The gastrostomy was assembled externally. Contrast injection confirms position in the stomach. Several spot radiographic images were obtained in various obliquities for documentation. The patient tolerated procedure well without immediate post procedural complication. FINDINGS: After successful fluoroscopic guided placement, the gastrostomy tube is appropriately positioned with internal disc against the ventral aspect of the gastric lumen. IMPRESSION: Successful fluoroscopic insertion of a 20-French pull-through gastrostomy tube. The gastrostomy may be used immediately for medication administration and in 24 hrs for the initiation of feeds. Electronically  Signed   By: Sandi Mariscal M.D.   On: 01/02/2019 10:14   DG CHEST PORT 1 VIEW  Result Date: 12/31/2018 CLINICAL DATA:  23 year old male with a history aspiration EXAM: PORTABLE CHEST 1 VIEW COMPARISON:  12/30/2018, 12/28/2018 FINDINGS: Cardiomediastinal silhouette unchanged in size and contour. Low lung volumes persist with linear opacities at the lung bases. No pneumothorax or large pleural effusion. Crowding of the interstitium and the vasculature. Enteric tube projects over the mediastinum and terminates out of the field of view. IMPRESSION: Unchanged appearance of the chest x-ray with low lung volumes and likely atelectasis. Unchanged enteric feeding tube. Electronically Signed   By: Corrie Mckusick D.O.   On: 12/31/2018 07:45   DG CHEST PORT 1 VIEW  Result Date: 12/30/2018 CLINICAL DATA:  Reason for exam: Vomiting, aspiration into airway Hx HTN, pre-diabetes, fatty liver, pancreatitis, pneumonia EXAM: PORTABLE CHEST - 1 VIEW COMPARISON:  12/28/2018 FINDINGS: Feeding tube remains in place. Persistent low lung volumes with coarse perihilar and bibasilar interstitial markings. No new infiltrate or edema. Heart size and mediastinal contours are within normal limits. No effusion. Visualized bones unremarkable. IMPRESSION: 1. Stable chest x-ray. No new infiltrate or edema. 2. Stable appearance of the cardiomediastinal silhouette. Electronically Signed   By: Lucrezia Europe M.D.   On: 12/30/2018 09:42   DG CHEST PORT 1 VIEW  Result Date: 12/28/2018 CLINICAL DATA:  Updated status.  Fever. EXAM: PORTABLE CHEST 1 VIEW COMPARISON:  December 26, 2018 FINDINGS: The feeding tube terminates below today's film. Mild opacity in left base is somewhat streaky in platelike laterally. Low lung volumes. No pneumothorax. The lungs are otherwise clear. The cardiomediastinal silhouette is stable. IMPRESSION: Streaky opacity in left base is favored to represent atelectasis given the platelike configuration laterally. Developing  infiltrate not completely excluded. Recommend attention on follow-up. 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 EXAM: PORTABLE CHEST 1 VIEW COMPARISON:  Portable chest 12/21/2018. FINDINGS: Portable AP semi upright view at 0052 hours. Extubated and NG type tube removed since 12/21/2018. Lower lung volumes. Mediastinal  contours remain normal. There is an enteric feeding tube in place which courses to the abdomen and probably crosses midline to the right but the tip is not included. Increased crowding of lung markings and/or pulmonary vascularity without overt edema. No pneumothorax, pleural effusion or consolidation. Stable visible bowel gas pattern. IMPRESSION: 1. Extubated with lower lung volumes. No other acute cardiopulmonary abnormality. 2. Enteric feeding tube courses to the abdomen, tip not included. Electronically Signed   By: Genevie Ann M.D.   On: 12/26/2018 01:08   DG CHEST PORT 1 VIEW  Result Date: 12/21/2018 CLINICAL DATA:  23 year old male with history of central line and nasogastric tube placement. EXAM: PORTABLE CHEST 1 VIEW COMPARISON:  Chest x-ray 12/21/2018. FINDINGS: An endotracheal tube is in place with tip 4.1 cm above the carina. There is a right-sided internal jugular central venous catheter with tip terminating in the distal superior vena cava. Nasogastric tube extending into the proximal stomach with side port just distal to the gastroesophageal junction. No pneumothorax. Lung volumes are low. Ill-defined opacities in the medial aspect of the left lung base and medial aspect of the right upper lobe. No pleural effusions. No evidence of pulmonary edema. Heart size is normal. IMPRESSION: 1. Support apparatus, as above. 2. Ill-defined opacities in the medial left lower lobe and medial right upper lobe concerning for  multilobar pneumonia. Electronically Signed   By: Vinnie Langton M.D.   On: 12/21/2018 17:22   DG CHEST PORT 1 VIEW  Result Date: 12/21/2018 CLINICAL DATA:  Aspiration, possible seizure EXAM: PORTABLE CHEST 1 VIEW COMPARISON:  12/20/2018 FINDINGS: No significant change in low volume AP portable examination without significant acute appearing airspace opacity. Esophagogastric tube remains with tip and side port below the diaphragm. Heart and mediastinum are unremarkable. IMPRESSION: No significant change in low volume portable chest x-ray. No new findings. Electronically Signed   By: Eddie Candle M.D.   On: 12/21/2018 11:32   DG CHEST PORT 1 VIEW  Result Date: 12/20/2018 CLINICAL DATA:  Fever. History of hypertension seizures and pancreatitis. EXAM: PORTABLE CHEST 1 VIEW COMPARISON:  12/16/2018 and older exams. FINDINGS: Lung volumes are low. Atelectasis in the medial lung bases. Lungs otherwise clear. No pleural effusion or pneumothorax. Cardiac silhouette normal in size.  No mediastinal or hilar masses. Nasogastric tube passes below the diaphragm well into the stomach. IMPRESSION: 1. No acute cardiopulmonary disease. Electronically Signed   By: Lajean Manes M.D.   On: 12/20/2018 13:39   DG CHEST PORT 1 VIEW  Result Date: 12/16/2018 CLINICAL DATA:  Shortness of breath. EXAM: PORTABLE CHEST 1 VIEW COMPARISON:  December 15, 2018. FINDINGS: Again noted are low lung volumes with bibasilar atelectasis. The cardiac silhouette remains enlarged. There is no pneumothorax. No large pleural effusion. No acute osseous abnormality. An enteric tube is noted. IMPRESSION: Stable appearance of the chest. Electronically Signed   By: Constance Holster M.D.   On: 12/16/2018 15:02   DG Chest Port 1 View  Result Date: 12/15/2018 CLINICAL DATA:  Respiratory failure. EXAM: PORTABLE CHEST 1 VIEW COMPARISON:  12/14/2018 FINDINGS: 0514 hours. Low lung volumes. Interval improvement in left basilar aeration. The lungs  are clear without focal pneumonia, edema, pneumothorax or pleural effusion. Cardiopericardial silhouette accentuated by low volume AP technique. NG tube tip is in the mid stomach. Telemetry leads overlie the chest. IMPRESSION: Low volume film without acute cardiopulmonary findings. Electronically Signed   By: Misty Stanley M.D.   On: 12/15/2018 07:37   DG CHEST PORT 1  VIEW  Result Date: 12/14/2018 CLINICAL DATA:  Shortness of breath. EXAM: PORTABLE CHEST 1 VIEW COMPARISON:  Chest radiograph 12/10/2018, abdominal radiograph 12/12/2018 FINDINGS: Enteric tube is been retracted, tip now in the distal esophagus, side-port in the mid esophagus. Low lung volumes similar to prior exam. Slight increasing patchy opacities at the left lung base. Unchanged heart size and mediastinal contours. No pleural fluid or pneumothorax. IMPRESSION: 1. Enteric tube has been retracted, tip now in the distal esophagus, side-port in the mid esophagus. Recommend advancement of least 10 cm place the side-port below the diaphragm. 2. Persistent low lung volumes. Increasing patchy opacities at the left lung base, may be atelectasis or pneumonia. Electronically Signed   By: Keith Rake M.D.   On: 12/14/2018 05:22   DG CHEST PORT 1 VIEW  Result Date: 12/10/2018 CLINICAL DATA:  Dyspnea. EXAM: PORTABLE CHEST 1 VIEW COMPARISON:  December 09, 2018. FINDINGS: The heart size and mediastinal contours are within normal limits. No pneumothorax or pleural effusion is noted. Hypoinflation of the lungs is noted with minimal bibasilar subsegmental atelectasis. The visualized skeletal structures are unremarkable. IMPRESSION: Hypoinflation of the lungs with minimal bibasilar subsegmental atelectasis. Electronically Signed   By: Marijo Conception M.D.   On: 12/10/2018 09:17   DG CHEST PORT 1 VIEW  Result Date: 12/09/2018 CLINICAL DATA:  Tachypnea, low-grade fever EXAM: PORTABLE CHEST 1 VIEW COMPARISON:  12/04/2018 FINDINGS: Low lung volumes.  Heart size is accentuated by the low volumes. Bibasilar atelectasis. No effusions. No acute bony abnormality. IMPRESSION: Low lung volumes, bibasilar atelectasis. Electronically Signed   By: Rolm Baptise M.D.   On: 12/09/2018 21:38   DG Chest Port 1 View  Result Date: 12/11/2018 CLINICAL DATA:  Sepsis. EXAM: PORTABLE CHEST 1 VIEW COMPARISON:  11/21/2018 FINDINGS: A very poor inspiration is again demonstrated. The heart remains grossly normal in size. Interval minimal patchy and linear density in the left lower lobe. Clear right lung. Mild peribronchial thickening. Unremarkable bones. IMPRESSION: 1. Interval minimal patchy and linear atelectasis or pneumonia in the left lower lobe. 2. Mild bronchitic changes. 3. Stable very poor inspiration. Electronically Signed   By: Claudie Revering M.D.   On: 12/17/2018 15:47   DG Abd Portable 1V  Result Date: 01/02/2019 CLINICAL DATA:  Severe sepsis. EXAM: PORTABLE ABDOMEN - 1 VIEW COMPARISON:  12/30/2018 FINDINGS: Air is present throughout the colon. Contrast is present over the right colon, terminal ileum and appendix. Small caliber probe projects over the rectum. Enteric tube is present with tip in the midline of the mid abdomen likely over the distal duodenum near the duodenal jejunal junction. Remainder the exam is unchanged IMPRESSION: 1.  Nonobstructive bowel gas pattern as described. 2. Enteric tube with tip unchanged just left of midline in the upper abdomen likely over the third/fourth portion of the duodenum. Electronically Signed   By: Marin Olp M.D.   On: 01/02/2019 08:03   DG Abd Portable 1V  Result Date: 12/30/2018 CLINICAL DATA:  Ileus EXAM: PORTABLE ABDOMEN - 1 VIEW COMPARISON:  Portable exam 1715 hours compared to 0906 hours FINDINGS: Tip of feeding tube projects over the third portion of duodenum approaching ligament of Treitz. Scattered gas throughout bowel, decreased from prior exam. No bowel dilatation or bowel wall thickening. Osseous  structures unremarkable. No urinary tract calcifications. IMPRESSION: Nonspecific bowel gas pattern. Decreased gaseous distention of bowel since prior study. Electronically Signed   By: Lavonia Dana M.D.   On: 12/30/2018 19:20   DG Abd Portable 1V  Result Date: 12/25/2018 CLINICAL DATA:  Feeding tube placement. EXAM: PORTABLE ABDOMEN - 1 VIEW COMPARISON:  December 24, 2018. FINDINGS: The bowel gas pattern is normal. Distal tip of feeding tube is seen in expected position of proximal duodenum. No radio-opaque calculi or other significant radiographic abnormality are seen. IMPRESSION: Negative. Electronically Signed   By: Marijo Conception M.D.   On: 12/25/2018 16:06   DG Abd Portable 1V  Result Date: 12/21/2018 CLINICAL DATA:  Nasogastric tube placement. EXAM: PORTABLE ABDOMEN - 1 VIEW COMPARISON:  December 20, 2018. FINDINGS: Mildly dilated air-filled large and small bowel loops are noted which may represent ileus. Distal tip of nasogastric tube is seen in proximal stomach. IMPRESSION: Distal tip of nasogastric tube seen in proximal stomach. Mildly dilated air-filled large and small bowel loops are noted which may represent ileus. Electronically Signed   By: Marijo Conception M.D.   On: 12/21/2018 17:22   DG Abd Portable 1V  Result Date: 12/20/2018 CLINICAL DATA:  NG placement EXAM: PORTABLE ABDOMEN - 1 VIEW COMPARISON:  Radiograph 12/20/2018 FINDINGS: Transesophageal tube tip and side port distal to the GE junction, curling in the left upper quadrant in the region of the gastric body. Atelectatic changes in the otherwise clear lung bases. Gaseous distention of what appears to be the colon without high-grade bowel obstruction. IMPRESSION: 1. Transesophageal tube tip and side port in the region of the gastric body. 2. Gaseous distention of the colon without high-grade bowel obstruction. Electronically Signed   By: Lovena Le M.D.   On: 12/20/2018 02:45   DG Abd Portable 1V  Result Date:  12/20/2018 CLINICAL DATA:  Check gastric catheter placement EXAM: PORTABLE ABDOMEN - 1 VIEW COMPARISON:  12/20/2018 FINDINGS: Gastric catheter has been advanced with the tip in the stomach. Proximal side port lies at the gastroesophageal junction. This could be advanced several cm. The lungs are clear. IMPRESSION: Gastric catheter as described. The proximal side port is not within the stomach and should be advanced several cm. Electronically Signed   By: Inez Catalina M.D.   On: 12/20/2018 01:50   DG Abd Portable 1V  Result Date: 12/20/2018 CLINICAL DATA:  NG tube placement EXAM: PORTABLE ABDOMEN - 1 VIEW COMPARISON:  12/16/2018 abdominal radiograph FINDINGS: The side port of the NG tube is in the distal esophagus. Recommend advancing by 7-10 cm. Lungs are clear. Nonobstructive bowel gas pattern. IMPRESSION: 1. Side-port of the nasogastric tube in the distal esophagus. Recommend advancing by 7-10 cm. 2. No active cardiopulmonary disease. Electronically Signed   By: Ulyses Jarred M.D.   On: 12/20/2018 00:41   DG Abd Portable 1V  Result Date: 12/12/2018 CLINICAL DATA:  NG tube placement. EXAM: PORTABLE ABDOMEN - 1 VIEW COMPARISON:  Radiograph dated 12/10/2018 FINDINGS: NG tube has been inserted and the tip is in the midbody of the stomach. The visualized bowel gas pattern is normal. No visible acute bone abnormality. IMPRESSION: NG tube in the body of the stomach. Electronically Signed   By: Lorriane Shire M.D.   On: 12/12/2018 21:18   DG Abd Portable 1V  Result Date: 12/10/2018 CLINICAL DATA:  Dyspnea, abdominal pain and distention EXAM: PORTABLE ABDOMEN - 1 VIEW COMPARISON:  11/26/2018 abdominal radiograph FINDINGS: There are mildly dilated small bowel loops throughout the abdomen measuring up to 3.9 cm diameter in the left abdomen, mildly decreased in caliber. No evidence of pneumatosis or pneumoperitoneum. No radiopaque nephrolithiasis. IMPRESSION: Diffuse mild small bowel dilatation, mildly  decreased, which could represent either  improving adynamic ileus or improving partial distal small bowel obstruction. Electronically Signed   By: Ilona Sorrel M.D.   On: 12/10/2018 09:19   DG Abd Portable 1V  Result Date: 12/09/2018 CLINICAL DATA:  Nasogastric tube placement. EXAM: PORTABLE ABDOMEN - 1 VIEW COMPARISON:  11/26/2018 FINDINGS: No nasogastric tube seen in the lower chest or upper abdomen. Multiple dilated small bowel loops with mild improvement. Gas is again demonstrated in normal caliber colon. Mild scoliosis. IMPRESSION: 1. No nasogastric tube seen in the lower chest or upper abdomen. 2. Mildly improved small bowel obstruction. Electronically Signed   By: Claudie Revering M.D.   On: 12/09/2018 11:40   DG Abd Portable 1V  Result Date: 12/05/2018 CLINICAL DATA:  Sepsis. Fever. EXAM: PORTABLE ABDOMEN - 1 VIEW COMPARISON:  11/23/2018 and abdomen and pelvis CT dated 11/24/2018. FINDINGS: Interval multiple mildly dilated loops of proximal small bowel with gas-filled normal caliber distal small bowel and colon. Unremarkable bones. IMPRESSION: Interval mild small bowel ileus or partial obstruction. No gross free air seen at this time. Electronically Signed   By: Claudie Revering M.D.   On: 12/07/2018 15:49   EEG adult  Result Date: 12/21/2018 Lora Havens, MD     12/21/2018 12:59 PM Patient Name:Aaron Vega ZJQ:[id number] Epilepsy Attending:Priyanka Barbra Sarks Referring Physician/Provider:Dr Kathrynn Speed Date: 12/21/2018 Duration: 23.02 minutes  Patient history:23 year old male with known epilepsy who was noted to have episode of extension of arm with eyes rolling upwards concerning for seizure.. EEG to evaluate for seizures.   Level of alertness:Awake  AEDs during EEG study:Keppra, Onfi  Technical aspects: This EEG study was done with scalp electrodes positioned according to the 10-20 International system of electrode placement. Electrical activity was acquired at a sampling  rate of _0  and reviewed with a high frequency filter of _1  and a low frequency filter of _2 . EEG data were recorded continuously and digitally stored.  Description:During awake state, no clear posterior dominant rhythm was seen. EEG showed continuous generalized 5 to 6 Hz theta slowing admixed withan excessive amount of 15 to 18 Hz, 2-3 uV beta activity with irregular morphology distributed symmetrically and diffusely. Hyperventilation and photic stimulation were not performed.  Abnormality -Continuous slow, generalized -Excessive beta, generalized    IMPRESSION: This study is suggestive of mild to moderate diffuse encephalopathy, nonspecific to etiology. No seizures or epileptiform discharges were seen throughout the recording.  The excessive beta activity seen in the background is most likely due to the effect of benzodiazepine and is a benign EEG pattern.  Priyanka Barbra Sarks  Overnight EEG with video  Result Date: 12/22/2018 Lora Havens, MD     12/23/2018  9:31 AM Patient Name:Aaron Vega WIO:[id number] Epilepsy Attending:Priyanka Barbra Sarks Referring Physician/Provider:Dr Kathrynn Speed Duration: 12/21/2018 1231 to 12/22/2018 1231  Patient history:23 year old male with known epilepsy who was noted to have episode of extension of arm with eyes rolling upwards concerning for seizure. EEG to evaluate for seizures.   Level of alertness:Awake, asleep  AEDs during EEG study:Keppra, Onfi  Technical aspects: This EEG study was done with scalp electrodes positioned according to the 10-20 International system of electrode placement. Electrical activity was acquired at a sampling rate of _3  and reviewed with a high frequency filter of _4  and a low frequency filter of _5 . EEG data were recorded continuously and digitally stored.  Description:During awake state, no clear posterior dominant rhythm was seen. Sleep was characterized by vertex waves, sleep spindles (12-_6 ),  maximal frontocentral.EEG showed continuous generalized 5  to 6 Hz theta slowing admixed withan excessive amount of 15 to 18 Hz, 2-3 uV beta activity with irregular morphology distributed symmetrically and diffusely. Hyperventilation and photic stimulation were not performed.  Abnormality -Continuous slow, generalized -Excessive beta, generalized   IMPRESSION: This study is suggestive of mild to moderate diffuse encephalopathy, nonspecific to etiology. No seizures or epileptiform discharges were seen throughout the recording.  The excessive beta activity seen in the background is most likely due to the effect of benzodiazepine and is a benign EEG pattern. Priyanka Barbra Sarks   Overnight EEG with video  Result Date: 12/11/2018 Lora Havens, MD     12/22/2018  8:54 AM Patient Name: Leanord Thibeau MRN: [id number] Epilepsy Attending: Lora Havens Referring Physician/Provider: Dr [name] Duration: 12/10/2018 1946 to 12/11/2018 1218  Patient history: 23 year old male with known epilepsy presented with altered mental status.  EEG to evaluate for seizures.   Level of alertness: Awake, asleep  AEDs during EEG study: Keppra, Onfi  Technical aspects: This EEG study was done with scalp electrodes positioned according to the 10-20 International system of electrode placement. Electrical activity was acquired at a sampling rate of _0  and reviewed with a high frequency filter of _1  and a low frequency filter of _2 . EEG data were recorded continuously and digitally stored.  Description: During awake state, no clear posterior dominant rhythm was seen.  Sleep was characterized by vertex waves, sleep spindles (12 to 14 Hz), maximal frontocentral.  EEG showed continuous generalized 5 to 6 Hz theta slowing admixed with an excessive amount of 15 to 18 Hz, 2-3 uV beta activity with irregular morphology distributed symmetrically and diffusely.  Hyperventilation and photic stimulation were not performed.   Abnormality -Continuous slow, generalized -Excessive beta, generalized     IMPRESSION: This study is suggestive of mild diffuse encephalopathy, nonspecific to etiology. No seizures or epileptiform discharges were seen throughout the recording.  The excessive beta activity seen in the background is most likely due to the effect of benzodiazepine and is a benign EEG pattern.  Lora Havens   ECHOCARDIOGRAM COMPLETE  Result Date: 12/12/2018   ECHOCARDIOGRAM REPORT   Patient Name:   TYRANN DONAHO Date of Exam: 12/12/2018 Medical Rec #:  [id number]      Height:       71.0 in Accession #:    [id number]     Weight:       298.0 lb Date of Birth:  08-Jun-1995       BSA:          2.50 m Patient Age:    23 years       BP:           110/86 mmHg Patient Gender: M              HR:           124 bpm. Exam Location:  Inpatient Procedure: 2D Echo and Intracardiac Opacification Agent Indications:    Abnormal ECG 794.31/R94.31  History:        Patient has no prior history of Echocardiogram examinations.  Sonographer:    Clayton Lefort RDCS (AE) Referring Phys: [id number] [name]  Sonographer Comments: Technically difficult study due to poor echo windows, suboptimal apical window, suboptimal parasternal window and patient is morbidly obese. Image acquisition challenging due to patient body habitus. IMPRESSIONS  1. Left ventricular ejection fraction, by visual estimation, is 55 to 60%. The left ventricle has normal function. There is mildly increased left ventricular  hypertrophy.  2. Definity contrast agent was given IV to delineate the left ventricular endocardial borders.  3. The left ventricle has no regional wall motion abnormalities.  4. Indeterminate diastolic filling due to E-A fusion.  5. Global right ventricle has normal systolic function.The right ventricular size is normal. No increase in right ventricular wall thickness.  6. Left atrial size was normal.  7. Right atrial size was normal.  8. The mitral valve is  normal in structure. No evidence of mitral valve regurgitation. No evidence of mitral stenosis.  9. The tricuspid valve is normal in structure. Tricuspid valve regurgitation is not demonstrated. 10. The aortic valve is tricuspid. Aortic valve regurgitation is not visualized. No evidence of aortic valve sclerosis or stenosis. 11. TR signal is inadequate for assessing pulmonary artery systolic pressure. 12. Technically difficult study with poor acoustic windows. FINDINGS  Left Ventricle: Left ventricular ejection fraction, by visual estimation, is 55 to 60%. The left ventricle has normal function. Definity contrast agent was given IV to delineate the left ventricular endocardial borders. The left ventricle has no regional wall motion abnormalities. The left ventricular internal cavity size was the left ventricle is normal in size. There is mildly increased left ventricular hypertrophy. Indeterminate diastolic filling due to E-A fusion. Right Ventricle: The right ventricular size is normal. No increase in right ventricular wall thickness. Global RV systolic function is has normal systolic function. Left Atrium: Left atrial size was normal in size. Right Atrium: Right atrial size was normal in size Pericardium: There is no evidence of pericardial effusion. Mitral Valve: The mitral valve is normal in structure. No evidence of mitral valve stenosis by observation. No evidence of mitral valve regurgitation. Tricuspid Valve: The tricuspid valve is normal in structure. Tricuspid valve regurgitation is not demonstrated. Aortic Valve: The aortic valve is tricuspid. Aortic valve regurgitation is not visualized. The aortic valve is structurally normal, with no evidence of sclerosis or stenosis. Aortic valve mean gradient measures 5.0 mmHg. Aortic valve peak gradient measures 8.3 mmHg. Aortic valve area, by VTI measures 2.58 cm. Pulmonic Valve: The pulmonic valve was normal in structure. Pulmonic valve regurgitation is not  visualized. Aorta: The aortic root is normal in size and structure. Venous: The inferior vena cava was not well visualized. IAS/Shunts: No atrial level shunt detected by color flow Doppler.  LEFT VENTRICLE PLAX 2D LVIDd:         4.14 cm LVIDs:         3.35 cm LV PW:         1.38 cm LV IVS:        1.32 cm LVOT diam:     2.10 cm LV SV:         30 ml LV SV Index:   11.34 LVOT Area:     3.46 cm  RIGHT VENTRICLE RV Basal diam:  3.34 cm RV S prime:     23.10 cm/s TAPSE (M-mode): 2.8 cm LEFT ATRIUM             Index       RIGHT ATRIUM           Index LA diam:        4.00 cm 1.60 cm/m  RA Area:     18.00 cm LA Vol (A2C):   50.6 ml 20.26 ml/m RA Volume:   49.50 ml  19.82 ml/m LA Vol (A4C):   56.1 ml 22.46 ml/m LA Biplane Vol: 55.1 ml 22.06 ml/m  AORTIC VALVE AV Area (Vmax):  2.69 cm AV Area (Vmean):   2.33 cm AV Area (VTI):     2.58 cm AV Vmax:           144.00 cm/s AV Vmean:          104.000 cm/s AV VTI:            0.234 m AV Peak Grad:      8.3 mmHg AV Mean Grad:      5.0 mmHg LVOT Vmax:         112.00 cm/s LVOT Vmean:        70.000 cm/s LVOT VTI:          0.174 m LVOT/AV VTI ratio: 0.74  AORTA Ao Root diam: 2.20 cm  SHUNTS Systemic VTI:  0.17 m Systemic Diam: 2.10 cm  Loralie Champagne MD Electronically signed by Loralie Champagne MD Signature Date/Time: 12/12/2018/5:14:36 PM    Final    VAS [place] LOWER EXTREMITY VENOUS (DVT)  Result Date: 12/22/2018  Lower Venous Study Indications: Edema.  Risk Factors: None identified. Limitations: Body habitus and poor ultrasound/tissue interface. Comparison Study: No prior studies. Performing Technologist: Oliver Hum RVT  Examination Guidelines: A complete evaluation includes B-mode imaging, spectral Doppler, color Doppler, and power Doppler as needed of all accessible portions of each vessel. Bilateral testing is considered an integral part of a complete examination. Limited examinations for reoccurring indications may be performed as noted.   +---------+---------------+---------+-----------+----------+--------------+ RIGHT    CompressibilityPhasicitySpontaneityPropertiesThrombus Aging +---------+---------------+---------+-----------+----------+--------------+ CFV      Full           Yes      Yes                                 +---------+---------------+---------+-----------+----------+--------------+ SFJ      Full                                                        +---------+---------------+---------+-----------+----------+--------------+ FV Prox  Full                                                        +---------+---------------+---------+-----------+----------+--------------+ FV Mid   Full                                                        +---------+---------------+---------+-----------+----------+--------------+ FV DistalFull                                                        +---------+---------------+---------+-----------+----------+--------------+ PFV      Full                                                        +---------+---------------+---------+-----------+----------+--------------+  POP      Full           Yes      Yes                                 +---------+---------------+---------+-----------+----------+--------------+ PTV      Full                                                        +---------+---------------+---------+-----------+----------+--------------+ PERO     Full                                                        +---------+---------------+---------+-----------+----------+--------------+   +---------+---------------+---------+-----------+----------+--------------+ LEFT     CompressibilityPhasicitySpontaneityPropertiesThrombus Aging +---------+---------------+---------+-----------+----------+--------------+ CFV      Full           Yes      Yes                                  +---------+---------------+---------+-----------+----------+--------------+ SFJ      Full                                                        +---------+---------------+---------+-----------+----------+--------------+ FV Prox  Full                                                        +---------+---------------+---------+-----------+----------+--------------+ FV Mid   Full                                                        +---------+---------------+---------+-----------+----------+--------------+ FV DistalFull                                                        +---------+---------------+---------+-----------+----------+--------------+ PFV      Full                                                        +---------+---------------+---------+-----------+----------+--------------+ POP      Full           Yes      Yes                                 +---------+---------------+---------+-----------+----------+--------------+  PTV      Full                                                        +---------+---------------+---------+-----------+----------+--------------+ PERO     Full                                                        +---------+---------------+---------+-----------+----------+--------------+     Summary: Right: There is no evidence of deep vein thrombosis in the lower extremity. No cystic structure found in the popliteal fossa. Left: There is no evidence of deep vein thrombosis in the lower extremity. No cystic structure found in the popliteal fossa.  *See table(s) above for measurements and observations. Electronically signed by Curt Jews MD on 12/22/2018 at 9:30:31 AM.    Final    [place] EKG SITE RITE  Result Date: 12/31/2018 If Site Rite image not attached, placement could not be confirmed due to current cardiac rhythm.  DG FL GUIDED LUMBAR PUNCTURE  Result Date: 12/14/2018 CLINICAL DATA:  Lower extremity weakness, decreased PO  intake, dehydration, concern for Guillain-Barre syndrome EXAM: DIAGNOSTIC LUMBAR PUNCTURE UNDER FLUOROSCOPIC GUIDANCE FLUOROSCOPY TIME:  Fluoroscopy Time: 72 seconds Radiation Exposure Index (if provided by the fluoroscopic device): 395.4 mGy Number of Acquired Spot Images: 3 PROCEDURE: Informed consent was obtained from the patient prior to the procedure, including potential complications of headache, allergy, and pain. With the patient prone, the lower back was prepped with Betadine. 1% Lidocaine was used for local anesthesia. Lumbar puncture was performed at the L5-S1 level using a 20 gauge needle with return of initially blood tinged, subsequently clear CSF. Extremely low opening pressure, which was not measured. 10.5 ml of CSF were obtained for laboratory studies. The patient tolerated the procedure well and there were no apparent complications. During the procedure, the patient's NG tube started to withdraw. This was returned to the original position in the gastric cardia and advanced slightly into the mid body of the stomach. IMPRESSION: Successful fluoroscopic guided lumbar puncture at L5-S1. Patient's NG tube readjusted, as described above. Electronically Signed   By: Julian Hy M.D.   On: 12/14/2018 17:19     Time Spent in minutes  40     Desiree Hane M.D on 01/03/2019 at 3:19 PM  To page go to [email or web address] - password [email or web address]

## 2019-01-03 NOTE — Progress Notes (Signed)
Chaplain's Note:  Pt.'s mother requested Baptism for patient. The [employer] which patient's father attends contacted office to request permission for priest to come for Baptism and sacrament of anointing. I cleared this request with patient's nurse and confirmed with mother that this was her desire.  When priest arrived I escorted him to room.  Pts nurses were present with mother for the baptism.  Mother seemed appreciative for the priest presence and the sacraments.   I will follow up with mother of patient for support.   Aaron Vega, [place] [phone]

## 2019-01-03 NOTE — Progress Notes (Signed)
Referring Physician(s): Dr. Maylene Roes  Supervising Physician: Markus Daft  Patient Status:  [facility] - In-pt  Chief Complaint: Encephalopathy  Subjective: Patient somnolent during visit.  Does not arouse. Mother at bedside.  G-tube in place without issue.  Allergies: Patient has no known allergies.  Medications: Prior to Admission medications   Medication Sig Start Date End Date Taking? Authorizing Provider  acetaminophen (TYLENOL) 650 MG suppository Place 650 mg rectally every 6 (six) hours as needed for mild pain or fever.  11/05/18  Yes [provider]  citalopram (CELEXA) 40 MG tablet Take 40 mg by mouth daily. 04/24/14  Yes [provider]  cloBAZam (ONFI) 20 MG tablet Take 60 mg by mouth 2 (two) times daily. 10/16/18  Yes [provider]  diazepam (DIASAT) 20 MG GEL Place 20 mg rectally daily as needed (for seizures lasting more than 5 minutes.).  07/03/13  Yes [provider]  diazepam (VALIUM) 5 MG tablet Take 7.5 mg by mouth every morning. May give 0.5 tablet (2.5mg ) as needed for agitation/anxiety 10/15/18  Yes [provider]  levothyroxine (SYNTHROID) 25 MCG tablet Take 1 tablet (25 mcg total) by mouth daily before breakfast. 11/25/18  Yes Mariel Aloe, MD  lisinopril (PRINIVIL,ZESTRIL) 20 MG tablet Take 20 mg by mouth daily. Take with 5mg  tablet for a total dose of 25mg  04/15/14  Yes [provider]  lisinopril (ZESTRIL) 5 MG tablet Take 5 mg by mouth daily. Take with 20mg  tablet for a total dose of 25mg  10/14/18  Yes [provider]  Melatonin 5 MG TABS Take 5 mg by mouth at bedtime.   Yes [provider]  Midazolam (NAYZILAM) 5 MG/0.1ML SOLN Place 5 mg into the nose daily as needed (seizure).   Yes [provider]  nystatin (MYCOSTATIN/NYSTOP) 100000 UNIT/GM POWD Apply 1 application topically daily. Neck, crevices, etc. 04/23/14  Yes [provider]  omeprazole (PRILOSEC) 40 MG capsule Take 1  capsule (40 mg total) by mouth 2 (two) times daily. Open capsule and sprinkle into applesauce or yoghurt. 11/27/18  Yes Mariel Aloe, MD  ondansetron (ZOFRAN ODT) 4 MG disintegrating tablet Take 1 tablet (4 mg total) by mouth every 8 (eight) hours as needed for nausea or vomiting. 11/25/18  Yes Mariel Aloe, MD  polyethylene glycol (MIRALAX) 17 g packet Take 17 g by mouth 2 (two) times daily. Patient taking differently: Take 17 g by mouth daily as needed for mild constipation.  11/25/18  Yes Mariel Aloe, MD  promethazine (PHENERGAN) 25 MG suppository Place 1 suppository (25 mg total) rectally every 6 (six) hours as needed for nausea or vomiting. 11/12/18  Yes Carmon Sails J, PA-C  sucralfate (CARAFATE) 1 GM/10ML suspension Take 10 mLs (1 g total) by mouth 4 (four) times daily -  with meals and at bedtime. 11/27/18  Yes Mariel Aloe, MD  linaclotide Rolan Lipa) 290 MCG CAPS capsule Take 1 capsule (290 mcg total) by mouth daily before breakfast. Patient not taking: Reported on 23-Dec-2018 11/26/18   Mariel Aloe, MD  MILK OF MAGNESIA 400 MG/5ML suspension Take 30-60 mLs by mouth daily as needed for constipation. 12/06/18   [provider]  zonisamide (ZONEGRAN) 100 MG capsule Take 100-200 mg by mouth See admin instructions. Take one capsule every other night for two weeks, then take on capsule every night for two weeks, then take 200mg  every night. 11/27/18   [provider]     Vital Signs: BP (!) 140/93  Pulse 93    Temp (!) 97.3 F (36.3 C) (Axillary)    Resp 16    Ht  (1.803 m)    Wt (!) 302 lb 0.5 oz (137 kg)    SpO2 100%    BMI 42.12 kg/m   Physical Exam  NAD, somnolent, comfortable-appearing Abdomen: soft, non-distended.  Gastrostomy tube in place. Insertion site c/d/i.  Abdominal binder loosely applied.   Imaging: IR GASTROSTOMY TUBE MOD SED  Result Date: 01/02/2019 INDICATION: Dysphagia. Please place percutaneous gastrostomy tube for enteric  nutrition supplementation purposes. EXAM: PULL TROUGH GASTROSTOMY TUBE PLACEMENT COMPARISON:  CT abdomen pelvis-12/22/2018 MEDICATIONS: Patient is currently admitted to the hospital and receiving intravenous antibiotics; Antibiotics were administered within 1 hour of the procedure. Glucagon 1 mg IV CONTRAST:  30 mL of Isovue-300 administered into the gastric lumen. ANESTHESIA/SEDATION: Moderate (conscious) sedation was employed during this procedure. A total of Versed 1 mg and Fentanyl 50 mcg was administered intravenously. Moderate Sedation Time: 10 minutes. The patient's level of consciousness and vital signs were monitored continuously by radiology nursing throughout the procedure under my direct supervision. FLUOROSCOPY TIME:  3 minutes, 30 seconds (89 mGy) COMPLICATIONS: None immediate. PROCEDURE: Informed written consent was obtained from the patient following explanation of the procedure, risks, benefits and alternatives. A time out was performed prior to the initiation of the procedure. Ultrasound scanning was performed to demarcate the edge of the left lobe of the liver. Maximal barrier sterile technique utilized including caps, mask, sterile gowns, sterile gloves, large sterile drape, hand hygiene and Betadine prep. The left upper quadrant was sterilely prepped and draped. An oral gastric catheter was inserted into the stomach under fluoroscopy. The existing nasogastric feeding tube was removed. The left costal margin and barium/air opacified transverse colon were identified and avoided. Air was injected into the stomach for insufflation and visualization under fluoroscopy. Under sterile conditions a 17 gauge trocar needle was utilized to access the stomach percutaneously beneath the left subcostal margin after the overlying soft tissues were anesthetized with 1% Lidocaine with epinephrine. Needle position was confirmed within the stomach with aspiration of air and injection of small amount of contrast. A  single T tack was deployed for gastropexy. Over an Amplatz guide wire, a 9-French sheath was inserted into the stomach. A snare device was utilized to capture the oral gastric catheter. The snare device was pulled retrograde from the stomach up the esophagus and out the oropharynx. The 20-French pull-through gastrostomy was connected to the snare device and pulled antegrade through the oropharynx down the esophagus into the stomach and then through the percutaneous tract external to the patient. The gastrostomy was assembled externally. Contrast injection confirms position in the stomach. Several spot radiographic images were obtained in various obliquities for documentation. The patient tolerated procedure well without immediate post procedural complication. FINDINGS: After successful fluoroscopic guided placement, the gastrostomy tube is appropriately positioned with internal disc against the ventral aspect of the gastric lumen. IMPRESSION: Successful fluoroscopic insertion of a 20-French pull-through gastrostomy tube. The gastrostomy may be used immediately for medication administration and in 24 hrs for the initiation of feeds. Electronically Signed   By: Simonne Come M.D.   On: 01/02/2019 10:14   DG CHEST PORT 1 VIEW  Result Date: 12/31/2018 CLINICAL DATA:  23 year old male with a history aspiration EXAM: PORTABLE CHEST 1 VIEW COMPARISON:  12/30/2018, 12/28/2018 FINDINGS: Cardiomediastinal silhouette unchanged in size and contour. Low lung volumes persist with linear opacities at the lung bases. No pneumothorax or  large pleural effusion. Crowding of the interstitium and the vasculature. Enteric tube projects over the mediastinum and terminates out of the field of view. IMPRESSION: Unchanged appearance of the chest x-ray with low lung volumes and likely atelectasis. Unchanged enteric feeding tube. Electronically Signed   By: Gilmer MorJaime  Wagner D.O.   On: 12/31/2018 07:45   DG Abd Portable 1V  Result Date:  01/02/2019 CLINICAL DATA:  Severe sepsis. EXAM: PORTABLE ABDOMEN - 1 VIEW COMPARISON:  12/30/2018 FINDINGS: Air is present throughout the colon. Contrast is present over the right colon, terminal ileum and appendix. Small caliber probe projects over the rectum. Enteric tube is present with tip in the midline of the mid abdomen likely over the distal duodenum near the duodenal jejunal junction. Remainder the exam is unchanged IMPRESSION: 1.  Nonobstructive bowel gas pattern as described. 2. Enteric tube with tip unchanged just left of midline in the upper abdomen likely over the third/fourth portion of the duodenum. Electronically Signed   By: Elberta Fortisaniel  Boyle M.D.   On: 01/02/2019 08:03   DG Abd Portable 1V  Result Date: 12/30/2018 CLINICAL DATA:  Ileus EXAM: PORTABLE ABDOMEN - 1 VIEW COMPARISON:  Portable exam 1715 hours compared to 0906 hours FINDINGS: Tip of feeding tube projects over the third portion of duodenum approaching ligament of Treitz. Scattered gas throughout bowel, decreased from prior exam. No bowel dilatation or bowel wall thickening. Osseous structures unremarkable. No urinary tract calcifications. IMPRESSION: Nonspecific bowel gas pattern. Decreased gaseous distention of bowel since prior study. Electronically Signed   By: Ulyses SouthwardMark  Boles M.D.   On: 12/30/2018 19:20   US EKG SITE RITE  Result Date: 12/31/2018 If Site Rite image not attached, placement could not be confirmed due to current cardiac rhythm.   Labs:  CBC: Recent Labs    12/31/18 0648 01/01/19 0641 01/02/19 0426 01/03/19 0817  WBC 19.3* 9.8 7.8 9.5  HGB 11.4* 10.0* 9.5* 9.9*  HCT 37.0* 33.3* 31.1* 32.2*  PLT 671* 460* 411* 413*    COAGS: Recent Labs    29-Oct-2018 1425 12/09/18 2252 12/12/18 0532 12/13/18 1530 12/14/18 0841  INR 2.9* 3.1* 3.6* 1.1 1.0  APTT 39*  --   --   --   --     BMP: Recent Labs    12/31/18 0648 01/01/19 0641 01/02/19 0426 01/03/19 0817  NA 142 144 145 144  K 4.0 3.4* 3.4* 2.8*    CL 104 105 104 106  CO2 28 27 28 28   GLUCOSE 96 97 103* 106*  BUN 17 12 14 10   CALCIUM 9.8 9.4 9.6 9.2  CREATININE 0.44* 0.40* 0.40* <0.30*  GFRNONAA >60 >60 >60 NOT CALCULATED  GFRAA >60 >60 >60 NOT CALCULATED    LIVER FUNCTION TESTS: Recent Labs    12/18/18 0159 12/20/18 0223 12/21/18 0752 12/22/18 0500  BILITOT 1.1 1.2 1.2 1.5*  AST 117* 81* 70* 48*  ALT 134* 102* 93* 65*  ALKPHOS 33* 30* 33* 28*  PROT 8.2* 9.4* 9.1* 8.2*  ALBUMIN 2.6* 2.5* 2.7* 2.7*    Assessment and Plan: Encephalopathy Aspiration pneumonia Inability to eat and drink PO, severe malnutrition of acute illness Patient s/p gastrostomy tube placement 12/9.  G-tube assessed this AM.  Intact.  No erythema or warmth.  No bleeding to site.  Ok for use at 24 hrs post placement.   Electronically Signed: Hoyt KochKacie Sue-Ellen Jesly Hartmann, PA 01/03/2019, 11:06 AM   I spent a total of 15 Minutes at the the patient's bedside AND on the patient's  hospital floor or unit, greater than 50% of which was counseling/coordinating care for encephalopathy

## 2019-01-04 LAB — BASIC METABOLIC PANEL
Anion gap: 9 (ref 5–15)
BUN: 12 mg/dL (ref 6–20)
CO2: 28 mmol/L (ref 22–32)
Calcium: 9.2 mg/dL (ref 8.9–10.3)
Chloride: 106 mmol/L (ref 98–111)
Creatinine, Ser: 0.42 mg/dL — ABNORMAL LOW (ref 0.61–1.24)
GFR calc Af Amer: >60 mL/min (ref >60)
GFR calc non Af Amer: >60 mL/min (ref >60)
Glucose, Bld: 103 mg/dL — ABNORMAL HIGH (ref 70–99)
Potassium: 3.8 mmol/L (ref 3.5–5.1)
Sodium: 143 mmol/L (ref 135–145)

## 2019-01-04 LAB — CBC
HCT: 32.6 % — ABNORMAL LOW (ref 39.0–52.0)
Hemoglobin: 10.1 g/dL — ABNORMAL LOW (ref 13.0–17.0)
MCH: 28.2 pg (ref 26.0–34.0)
MCHC: 31 g/dL (ref 30.0–36.0)
MCV: 91.1 fL (ref 80.0–100.0)
Platelets: 409 10*3/uL — ABNORMAL HIGH (ref 150–400)
RBC: 3.58 MIL/uL — ABNORMAL LOW (ref 4.22–5.81)
RDW: 17.2 % — ABNORMAL HIGH (ref 11.5–15.5)
WBC: 9.8 10*3/uL (ref 4.0–10.5)
nRBC: 0 % (ref 0.0–0.2)

## 2019-01-04 NOTE — Progress Notes (Signed)
Nutrition Follow-up  DOCUMENTATION CODES:   Severe malnutrition in context of acute illness/injury  INTERVENTION:  Continue Vital 1.5 formula via post pyloric Cortrak tube at 25 ml/hr.   Once able to advance past rate of 25 ml/hr, recommend advancing by 10 ml every 4 hours to goal rate of 55 ml/hr.   Continue 60 ml Prostat BID via tube.   Tube feeding to provide 2380 kcal (100% of needs), 149 grams of protein, and 1003 ml of free water.  NUTRITION DIAGNOSIS:   Severe Malnutrition related to acute illness as evidenced by energy intake < or equal to 50% for > or equal to 5 days, percent weight loss; ongoing  GOAL:   Patient will meet greater than or equal to 90% of their needs; met with TF  MONITOR:   TF tolerance, Skin, Weight trends, Labs, I & O's  REASON FOR ASSESSMENT:   Consult Enteral/tube feeding initiation and management, Assessment of nutrition requirement/status  ASSESSMENT:   23 yo male with autism and epilepsy with 6 week hx of rapid decline in cognition and gait in addition to new peripheral neuropathic symptoms since the onset of intractable N/V for 6 weeks. Per neurology, findings most consistent with acute thiamine deficiency, pt with B12 deficiency as well. Pt is nonverbal at baseline. Additional PMH includes fatty liver, HTN, pancreatitis, OSA   Pt admitted with ascending weakness and encephalopathy with extensive workup with working dx of GBS and Wernicke's s/p IVIG and ongoing thiamine repletion.   12/2 TF advanced to goal 12/1 Post pyloric Cortrak placed, on TF @ 20 11/30 extubated 11/29 trickle TF started @ 20, tube at proximal stomach  11/27 pt with sz and aspiration event. Pt vomited. NG to suction with 1.2 L out. Failed cortrak placement. Plan for small bore tube placement in IR this afternoon. After discussion with MD, family and palliative pt intubated.  11/26 TF @ 40 11/25 TF @ 30 11/23 TF started @ 20 11/30 Extubated   12/5 Pt with  vomiting, Ileus developing again per MD 12/7 TF restarted @ 25 12/9 PEG placed   Tube feeding resumed via PEG yesterday. Feedings currently infusing at goal rate and pt has been tolerating it well with no difficulties. RD to continue with current orders and monitor for continued tolerance.   Labs and medications reviewed.   Diet Order:   Diet Order            Diet NPO time specified  Diet effective now              EDUCATION NEEDS:   Not appropriate for education at this time  Skin:  Skin Assessment: Skin Integrity Issues: Skin Integrity Issues:: Other (Comment), DTI DTI: buttocks Other: Wound R toe  Last BM:  12/10  Height:   Ht Readings from Last 1 Encounters:  11/28/2018 _0  (1.803 m)    Weight:   Wt Readings from Last 1 Encounters:  01/03/19 (!) 137 kg    Ideal Body Weight:  78.1 kg  BMI:  Body mass index is 42.12 kg/m.  Estimated Nutritional Needs:   Kcal:  2200-2500  Protein:  120-140 grams  Fluid:  >/= 2 L    Corrin Parker, MS, RD, LDN Pager # [phone] After hours/ weekend pager # [phone]

## 2019-01-04 NOTE — Progress Notes (Signed)
SLP Cancellation Note  Patient Details Name: Aaron Vega MRN: [id number] DOB: 1996/01/02   Cancelled treatment:       Reason Eval/Treat Not Completed: Other (comment) Went to see pt for dysphagia tx but palliative care had just stepped in the room. Per RN, MD had just finished a conversation with pt's mother and they are currently trying to determine overall GOC. SLP will f/u as indicated after [place] are established.    Venita Sheffield Marti Acebo 01/04/2019, 1:58 PM  Pollyann Glen, M.A. [place] Acute Environmental education officer [phone] Office [phone]

## 2019-01-04 NOTE — Progress Notes (Signed)
RT NOTE: CPT not performed at this time. MD in room having conversation with patient's mother. Patient is resting comfortably. Vitals are stable. RT will continue to monitor.

## 2019-01-04 NOTE — Progress Notes (Signed)
Palliative: Aaron Vega is resting quietly in bed.  He appears chronically ll and frail.  He ds nt respond to sternal rub.  He is unable to make his basic needs known. Mother Aaron Vega is at bedside.   We talk at length about his hospital course, his baptism yesterday and her worry about his ability to recover. We talk about seeing Aaron Vega as his own person first, her child second. Aaron Vega shares that she is trying to "not be selfish".  Support given to patient and family.   Conference with bedside nursing related to patient condition and needs.   Plan:   Continue to treat the treatable, limits set DNI, mother remains hopeful, but shares her concerns that [name] will not recover to meaningful state.      23 minutes Quinn Axe, NP Palliative Medicine Team Team Phone # [phone] Greater than 50% of this time was spent counseling and coordinating care related to the above assessment and plan.

## 2019-01-04 NOTE — Progress Notes (Signed)
[facility]  PROGRESS NOTE  Aaron Vega BTD:[id number] DOB: Mar 12, 1995 DOA: 12/14/2018 PCP: [facility] Admit date - 11/30/2018   Admitting Physician Assunta Found, MD  Outpatient Primary MD for the patient is [facility] [place]  LOS - 76 Brief Narrative    23 year old autistic male with autism and known epilepsy followed by [facility] and nonverbal at baseline, with marked cognitive decline and gait dysfunction since head injury after seizure on 08/2018 attributed to hypoxic ischemic encephalopathy following prolonged seizure. Prior to that he had a much better quality of life in terms of independence with ADLs. He presented on 12/05/2018 with severe obstipation, lethargy and vomiting with decreased urinary output.   Admitted with working diagnosis of subacute shock with lactic acidosis from aspiration pneumonia, metabolic encephalopathy and ileus.  Hospital course complicated by  -persistent lactic acidosis -Wernicke's encephalopathy in setting of severe thiamine deficiency, confirmed on MRI 11/16 (given rapid decline in cognition and gait in addition to new onset of peripheral neuropathic symptoms since the onset of intractable N/V) -Adynamic ileus and hepatic steatosis, 11/28 on Ct abd/pelvis -GBS treated with IVIG.   -Intubation for respiratory failure in setting of sepsis and inability to clear secretions (11/27 intubation, extubated 11/30) -Pressor support for hypotension, off on 11/30 -Frequent need for deep suctioning due to inability to clear secretions   Subjective  Aaron Vega today had no acute events overnight.  Today not opening eyes for me at all, not interacting at all during exam.  Mom at bedside and shares she feels the last 2 days his course has worsened in terms of interaction. A & P  Acute on chronic hypoxemic respiratory failure. Inability to clear secretions due to poor mentation, multifactorial  neurologic weakness.  Currently being treated for aspiration pna. Hx of OSA, intolerant of CPAP.  -Last evaluated by PCCM on 12/7 and very high risk for reintubation, discussed with mother today she elects for DNI but to continue full scope of treatment -monitor in SDU, requiring frequent deep suctioning and Chest PT -high aspiration risk, NPO  Aspiration pneumonia.  Sepsis physiology resolved.  Blood cultures remain negative.  Afebrile over last 24 hours.  Completed previous course of cefepime, Flagyl (11/14-7/18, Unasyn 11/28-12/2) recent chest x-ray shows no overt opacities -Repeat blood cultures unremarkable so far -Continue Unasyn (restarted on 12/7 -Continue aspiration precautions as above -WBC normal  Hypokalemia, improving.  On daily potassium here but missed dose yesterday while holding use of newly inserted PEG tube -Check mag, continue 40 mEq daily potassium, add additional 40 mEq x 1 -Monitor BMP daily  Dysphagia Decreased mentation, declining food, high aspiration risk -s/p IR PEG tube on 12/8 -tolerating TF at goal rate   Ascending weakness and metabolic encephalopathy, secondary to GBS + Wernicke's encephalopathy/B1 deficiency. Completed 5 day IVIG course and high dose thiamine -continue thiamine 100 mg daily and multivitamin  Epilepsy history,stable. EEG neg for epileptic activity.  -Continue keppra and onfi, sz precautions -followed by [place] [facility] -[facility] follow up provided per family request on discharge  Adynamic Ileus. Resolved on Abd xr. GI assisted with care. No long on magnesuim and erythromycin - maintain normal K and mg  Goals of care. Came in as hospice care patient and mother retracted that status.  I continue to have bedside conversations with his mother. She understands he has a very low risk of meaningful recovery. She is aware that he is less interactive the last 48 hours and has little buffer to sustain an  acute clinical change. -Appreciate Palliative  care support -Continue goals of care discussions with mother  Hypothyroidism, stable -Continue Synthroid  Hypertension, stable  -Continue lisinopril, metoprolol  Severe malnutrition in context of acute illness/injury. -Continue tube feeds now at goal rate -Continue supplementing feeds   Family Communication  :  Mom updated at bedside  Code Status :  Partial/Full scope/Do not intubate  Disposition Plan  :  Close monitoring progressive unit for deep suctioning for respiratory status, monitor neuro status. Very guarded prognosis  Consults  : Neurology, GI, PCCM, palliative  Procedures  :   10/16 NG tube placed per interventional radiology 11/15 Midline > 11/20 lumbar puncture 11/27 endotracheal intubation > 11/30 11/27 central line placement, right IJ > 12/1*  DVT Prophylaxis  :  Lovenox   Lab Results  Component Value Date   PLT 409 (H) 01/04/2019    Diet :  Diet Order            Diet NPO time specified  Diet effective now               Inpatient Medications Scheduled Meds: . chlorhexidine gluconate (MEDLINE KIT)  15 mL Mouth Rinse BID  . Chlorhexidine Gluconate Cloth  6 each Topical Q0600  . cloBAZam  60 mg Per Tube BID  . enoxaparin (LOVENOX) injection  40 mg Subcutaneous Q12H  . feeding supplement (PRO-STAT SUGAR FREE 64)  60 mL Per Tube BID  . levothyroxine  25 mcg Per Tube Q0600  . lisinopril  40 mg Per NG tube Daily  . mouth rinse  15 mL Mouth Rinse q12n4p  . metoprolol tartrate  125 mg Per NG tube BID  . multivitamin  15 mL Per Tube Daily  . nystatin   Topical Daily  . pantoprazole sodium  40 mg Per Tube BID  . potassium chloride  40 mEq Per Tube Daily  . sodium chloride flush  10-40 mL Intracatheter Q12H  . sodium chloride flush  10-40 mL Intracatheter Q12H  . tamsulosin  0.4 mg Oral Daily  . thiamine injection  100 mg Intravenous Daily  . vitamin B-12  1,000 mcg Per Tube Daily   Continuous Infusions: . ampicillin-sulbactam (UNASYN) IV 3 g  (01/04/19 1223)  . feeding supplement (VITAL 1.5 CAL) 1,000 mL (01/01/19 1158)  . levETIRAcetam 1,000 mg (01/04/19 1151)   PRN Meds:.acetaminophen (TYLENOL) oral liquid 160 mg/5 mL, fentaNYL (SUBLIMAZE) injection, ibuprofen, ipratropium-albuterol, labetalol, lip balm, LORazepam, metoprolol tartrate, ondansetron (ZOFRAN) IV, sodium chloride flush, sodium chloride flush, white petrolatum  Antibiotics  :   Anti-infectives (From admission, onward)   Start     Dose/Rate Route Frequency Ordered Stop   12/31/18 1100  Ampicillin-Sulbactam (UNASYN) 3 g in sodium chloride 0.9 % 100 mL IVPB     3 g 200 mL/hr over 30 Minutes Intravenous Every 6 hours 12/31/18 1000 01/05/19 1259   12/22/18 0900  ampicillin-sulbactam (UNASYN) 1.5 g in sodium chloride 0.9 % 100 mL IVPB  Status:  Discontinued     1.5 g 200 mL/hr over 30 Minutes Intravenous Every 6 hours 12/22/18 0851 12/22/18 0857   12/22/18 0900  Ampicillin-Sulbactam (UNASYN) 3 g in sodium chloride 0.9 % 100 mL IVPB     3 g 200 mL/hr over 30 Minutes Intravenous Every 6 hours 12/22/18 0857 12/26/18 2105   12/19/18 1400  erythromycin (EES) 400 MG/5ML suspension 500 mg  Status:  Discontinued     500 mg Per Tube Every 8 hours 12/19/18 1132 12/30/18 1013  12/18/18 1500  erythromycin 500 mg in sodium chloride 0.9 % 100 mL IVPB  Status:  Discontinued     500 mg 100 mL/hr over 60 Minutes Intravenous Every 8 hours 12/18/18 1449 12/19/18 1132   12/15/18 1400  erythromycin (EES) 400 MG/5ML suspension 400 mg  Status:  Discontinued     400 mg Per Tube Every 8 hours 12/15/18 1012 12/15/18 1130   12/15/18 1400  erythromycin 500 mg in sodium chloride 0.9 % 100 mL IVPB  Status:  Discontinued     500 mg 100 mL/hr over 60 Minutes Intravenous Every 8 hours 12/15/18 1130 12/18/18 1449   12/09/18 0600  vancomycin (VANCOCIN) 1,250 mg in sodium chloride 0.9 % 250 mL IVPB  Status:  Discontinued     1,250 mg 166.7 mL/hr over 90 Minutes Intravenous Every 12 hours 12/02/2018 1539  12/10/18 1043   12/09/18 0000  ceFEPIme (MAXIPIME) 2 g in sodium chloride 0.9 % 100 mL IVPB  Status:  Discontinued     2 g 200 mL/hr over 30 Minutes Intravenous Every 8 hours 12/24/2018 1539 12/11/18 1801   12/09/18 0000  metroNIDAZOLE (FLAGYL) IVPB 500 mg  Status:  Discontinued     500 mg 100 mL/hr over 60 Minutes Intravenous Every 8 hours 12/11/2018 2146 12/11/18 1801   12/15/2018 1415  ceFEPIme (MAXIPIME) 2 g in sodium chloride 0.9 % 100 mL IVPB     2 g 200 mL/hr over 30 Minutes Intravenous  Once 12/03/2018 1411 12/21/2018 1538   12/02/2018 1415  metroNIDAZOLE (FLAGYL) IVPB 500 mg     500 mg 100 mL/hr over 60 Minutes Intravenous  Once 12/15/2018 1411 11/29/2018 1609   12/03/2018 1415  vancomycin (VANCOCIN) IVPB 1000 mg/200 mL premix  Status:  Discontinued     1,000 mg 200 mL/hr over 60 Minutes Intravenous  Once 12/07/2018 1411 12/07/2018 1414   12/23/2018 1415  vancomycin (VANCOCIN) 2,500 mg in sodium chloride 0.9 % 500 mL IVPB     2,500 mg 250 mL/hr over 120 Minutes Intravenous  Once 12/04/2018 1414 11/26/2018 1710       Objective   Vitals:   01/04/19 0745 01/04/19 0834 01/04/19 1220 01/04/19 1523  BP: 128/77  127/67   Pulse: 93 88 (!) 110 (!) 107  Resp: '15 15 18 '$ (!) 25  Temp: 97.7 F (36.5 C)  98.5 F (36.9 C)   TempSrc: Rectal  Oral   SpO2: 100% 100% 100% 100%  Weight:      Height:        SpO2: 100 % O2 Flow Rate (L/min): 2 L/min FiO2 (%): 28 %  Wt Readings from Last 3 Encounters:  01/03/19 (!) 137 kg  11/21/18 (!) 148.8 kg  11/08/18 (!) 148.8 kg     Intake/Output Summary (Last 24 hours) at 01/04/2019 1550 Last data filed at 01/04/2019 0930 Gross per 24 hour  Intake 10 ml  Output 1900 ml  Net -1890 ml    Physical Exam:  Obese, young male, eyes closed throughout exam Not following commands Diffuse rhonchi, without respiratory distress, occasionally tries to cough with poor effort No new F.N deficits,  [place].AT, Mildly Tachycardic, No Gallops,Rubs or new Murmurs,  Abdominal  binder in place, diminished bowel sounds  I have personally reviewed the following:   Data Reviewed:  CBC Recent Labs  Lab 12/31/18 0648 01/01/19 0641 01/02/19 0426 01/03/19 0817 01/04/19 0640  WBC 19.3* 9.8 7.8 9.5 9.8  HGB 11.4* 10.0* 9.5* 9.9* 10.1*  HCT 37.0* 33.3* 31.1* 32.2* 32.6*  PLT 671* 460* 411* 413* 409*  MCV 93.4 93.8 92.3 91.7 91.1  MCH 28.8 28.2 28.2 28.2 28.2  MCHC 30.8 30.0 30.5 30.7 31.0  RDW 18.5* 17.7* 17.3* 17.0* 17.2*    Chemistries  Recent Labs  Lab 12/31/18 0648 01/01/19 0641 01/02/19 0426 01/03/19 0817 01/04/19 0640  NA 142 144 145 144 143  K 4.0 3.4* 3.4* 2.8* 3.8  CL 104 105 104 106 106  CO2 '28 27 28 28 28  '$ GLUCOSE 96 97 103* 106* 103*  BUN '17 12 14 10 12  '$ CREATININE 0.44* 0.40* 0.40* <0.30* 0.42*  CALCIUM 9.8 9.4 9.6 9.2 9.2  MG  --  2.2  --  1.8  --    ------------------------------------------------------------------------------------------------------------------ No results for input(s): CHOL, HDL, LDLCALC, TRIG, CHOLHDL, LDLDIRECT in the last 72 hours.  No results found for: HGBA1C ------------------------------------------------------------------------------------------------------------------ No results for input(s): TSH, T4TOTAL, T3FREE, THYROIDAB in the last 72 hours.  Invalid input(s): FREET3 ------------------------------------------------------------------------------------------------------------------ No results for input(s): VITAMINB12, FOLATE, FERRITIN, TIBC, IRON, RETICCTPCT in the last 72 hours.  Coagulation profile No results for input(s): INR, PROTIME in the last 168 hours.  No results for input(s): DDIMER in the last 72 hours.  Cardiac Enzymes No results for input(s): CKMB, TROPONINI, MYOGLOBIN in the last 168 hours.  Invalid input(s): CK ------------------------------------------------------------------------------------------------------------------    Component Value Date/Time   BNP 67.3 12/10/2018  0930    Micro Results Recent Results (from the past 240 hour(s))  C difficile quick scan w PCR reflex     Status: None   Collection Time: 12/30/18 11:50 AM   Specimen: STOOL  Result Value Ref Range Status   C Diff antigen NEGATIVE NEGATIVE Final   C Diff toxin NEGATIVE NEGATIVE Final   C Diff interpretation No C. difficile detected.  Final    Comment: Performed at [facility], [place] [address], [place], [place] [address]  Culture, blood (routine x 2)     Status: None (Preliminary result)   Collection Time: 01/02/19  9:03 AM   Specimen: BLOOD LEFT HAND  Result Value Ref Range Status   Specimen Description BLOOD LEFT HAND  Final   Special Requests   Final    BOTTLES DRAWN AEROBIC ONLY Blood Culture results may not be optimal due to an inadequate volume of blood received in culture bottles   Culture   Final    NO GROWTH 2 DAYS Performed at [facility], [place] [address] [place], [place] [address]    Report Status PENDING  Incomplete  Culture, blood (routine x 2)     Status: None (Preliminary result)   Collection Time: 01/02/19  1:29 PM   Specimen: BLOOD  Result Value Ref Range Status   Specimen Description BLOOD LEFT FOOT  Final   Special Requests   Final    BOTTLES DRAWN AEROBIC ONLY Blood Culture adequate volume   Culture   Final    NO GROWTH 2 DAYS Performed at [facility], [place] [address], [place],  [address]    Report Status PENDING  Incomplete    Radiology Reports DG Abd 1 View  Result Date: 12/30/2018 CLINICAL DATA:  Reason for exam: Vomiting, aspiration into airway Hx HTN, pre-diabetes, fatty liver, pancreatitis, pneumonia EXAM: ABDOMEN - 1 VIEW COMPARISON:  12/25/2018 FINDINGS: Feeding tube tip in the distal duodenum. Stomach appears decompressed. Multiple dilated small bowel loops in the mid abdomen, increased since previous. Scattered gas in the nondilated colon. CLINICAL DATA:  Reason for exam:  Vomiting, aspiration into airway Hx HTN,  pre-diabetes, fatty liver, pancreatitis, pneumonia EXAM: ABDOMEN - 1 VIEW COMPARISON:  12/25/2018 FINDINGS: Feeding tube tip in the distal duodenum. Stomach appears decompressed. Multiple dilated small bowel loops in the mid abdomen, increased since previous. Scattered gas in the nondilated colon. IMPRESSION: 1. Multiple dilated small bowel loops in the mid abdomen, increased since previous, suggesting obstruction or developing ileus. 2. Feeding tube tip in the distal duodenum.  Stomach  decompressed. Electronically Signed   By: Lucrezia Europe M.D.   On: 12/30/2018 09:43   DG Abd 1 View  Result Date: 12/24/2018 CLINICAL DATA:  Nasogastric tube placement. EXAM: ABDOMEN - 1 VIEW COMPARISON:  December 21, 2018. FINDINGS: The bowel gas pattern is normal. Nasogastric tube tip is seen in expected position of third portion of duodenum. No radio-opaque calculi or other significant radiographic abnormality are seen. IMPRESSION: Nasogastric tube tip seen in expected position of third portion of duodenum. No evidence of bowel obstruction or ileus. Electronically Signed   By: Marijo Conception M.D.   On: 12/24/2018 14:39   DG Abd 1 View  Result Date: 12/16/2018 CLINICAL DATA:  Ileus. EXAM: ABDOMEN - 1 VIEW COMPARISON:  None. FINDINGS: There is a dilated loop of small bowel in the mid abdomen measuring approximately 3.6 cm. There is no obvious pneumatosis or free air. The patient's enteric tube is outside the field of view. IMPRESSION: Mildly dilated loop of small bowel in the mid abdomen is nonspecific and could represent an ileus or partial small bowel obstruction the appropriate clinical setting. Electronically Signed   By: Constance Holster M.D.   On: 12/16/2018 15:04   DG Abd 1 View  Result Date: 12/14/2018 CLINICAL DATA:  Nasogastric tube advancement. EXAM: ABDOMEN - 1 VIEW COMPARISON:  One view abdomen 12/12/2018 and 12/10/2018. CT 12/01/2018. FINDINGS: 0936 hours. Two views are submitted. The tip of the  endotracheal tube is unchanged, overlying the mid stomach. The stomach and colon are mildly distended with gas. No significant small bowel distention identified. There is no evidence of free intraperitoneal air. Atelectasis is present at both lung bases. IMPRESSION: Stable location of the nasogastric tube. No evidence of bowel obstruction. Electronically Signed   By: Richardean Sale M.D.   On: 12/14/2018 09:53   DG Abd 1 View  Result Date: 12/10/2018 CLINICAL DATA:  23 year old male status post NG tube placement. EXAM: ABDOMEN - 1 VIEW COMPARISON:  Abdominal radiograph dated 12/10/2018. FINDINGS: Partially visualized enteric tube with tip and side-port over the gastric air. The osseous structures and soft tissues appear unremarkable. IMPRESSION: Enteric tube with tip and side-port over the gastric air. Electronically Signed   By: Anner Crete M.D.   On: 12/10/2018 14:41   CT Head Wo Contrast  Result Date: 12/24/2018 CLINICAL DATA:  Altered level of consciousness EXAM: CT HEAD WITHOUT CONTRAST TECHNIQUE: Contiguous axial images were obtained from the base of the skull through the vertex without intravenous contrast. COMPARISON:  11/03/2018 FINDINGS: Brain: The brainstem, cerebellum, cerebral peduncles, thalami, basal ganglia, basilar cisterns, and ventricular system appear within normal limits. No intracranial hemorrhage, mass lesion, or acute CVA. Vascular: Unremarkable Skull: Unremarkable Sinuses/Orbits: Mild chronic right frontal sinusitis. Other: No supplemental non-categorized findings. IMPRESSION: 1. No significant intracranial abnormality is observed. 2. Mild chronic right frontal sinusitis. Electronically Signed   By: Van Clines M.D.   On: 12/17/2018 19:02   CT THORACIC SPINE WO CONTRAST  Result Date: 12/14/2018 CLINICAL DATA:  Myelopathy, acute or progressive EXAM: CT  THORACIC SPINE WITHOUT CONTRAST TECHNIQUE: Multidetector CT images of the thoracic were obtained using the standard  protocol without intravenous contrast. COMPARISON:  None. FINDINGS: Alignment: Normal Vertebrae: Normal vertebra.  No fracture or mass. Paraspinal and other soft tissues: Negative for paraspinous mass or adenopathy. NG tube in place Left lower lobe small infiltrate most likely pneumonia. Fatty liver with hepatomegaly. Disc levels: Disc spaces are well preserved. No significant disc degeneration or spurring. Negative for spinal stenosis. IMPRESSION: Negative CT thoracic spine Left lower lobe infiltrate, probable pneumonia Electronically Signed   By: Franchot Gallo M.D.   On: 12/14/2018 20:48   CT LUMBAR SPINE WO CONTRAST  Result Date: 12/14/2018 CLINICAL DATA:  Myelopathy, acute or progressive. EXAM: CT LUMBAR SPINE WITHOUT CONTRAST TECHNIQUE: Multidetector CT imaging of the lumbar spine was performed without intravenous contrast administration. Multiplanar CT image reconstructions were also generated. COMPARISON:  None. FINDINGS: Segmentation: Normal Alignment: Normal Vertebrae: Negative for fracture or mass.  Normal vertebra. Paraspinal and other soft tissues: Negative for paraspinous mass or adenopathy. No soft tissue edema. Disc levels: Disc spaces maintained. No significant disc degeneration or spinal stenosis. IMPRESSION: Negative CT lumbar spine. Electronically Signed   By: Franchot Gallo M.D.   On: 12/14/2018 20:51   MR BRAIN W WO CONTRAST  Result Date: 12/10/2018 CLINICAL DATA:  Slowly progressive ataxia.  History of seizures. EXAM: MRI HEAD WITHOUT AND WITH CONTRAST TECHNIQUE: Multiplanar, multiecho pulse sequences of the brain and surrounding structures were obtained without and with intravenous contrast. CONTRAST:  21m GADAVIST GADOBUTROL 1 MMOL/ML IV SOLN COMPARISON:  Head CT 12/06/2018 FINDINGS: Brain: There is considerable motion degradation. There is abnormal T2 signal and restricted diffusion in both medial thalami. The most likely explanation is Wernicke encephalopathy. The remainder of  the brain appears normal. No evidence of ischemic infarction, mass lesion, hemorrhage, hydrocephalus or extra-axial collection. No abnormal contrast enhancement is demonstrated. Vascular: Major vessels at the base of the brain show flow. Skull and upper cervical spine: Negative Sinuses/Orbits: Clear/normal Other: None IMPRESSION: Abnormal T2 signal and restricted diffusion in both medial thalami. 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 Electronically Signed   By: MNelson ChimesM.D.   On: 12/10/2018 17:16   MR CERVICAL SPINE W WO CONTRAST  Result Date: 12/10/2018 CLINICAL DATA:  Slowly progressive ataxia and seizures. EXAM: MRI CERVICAL SPINE WITHOUT AND WITH CONTRAST TECHNIQUE: Multiplanar and multiecho pulse sequences of the cervical spine, to include the craniocervical junction and cervicothoracic junction, were obtained without and with intravenous contrast. CONTRAST:  156mGADAVIST GADOBUTROL 1 MMOL/ML IV SOLN COMPARISON:  None. FINDINGS: The study suffers from considerable motion degradation. Alignment: Normal Vertebrae: Normal Cord: Normal Posterior Fossa, vertebral arteries, paraspinal tissues: See results of brain MRI. Paraspinal regions negative. Disc levels: Normal IMPRESSION: Motion degraded exam.  No abnormality seen in the cervical region. Electronically Signed   By: MaNelson Chimes.D.   On: 12/10/2018 17:20   MR THORACIC SPINE W WO CONTRAST  Result Date: 12/19/2018 CLINICAL DATA:  Quadriplegia EXAM: MRI THORACIC AND LUMBAR SPINE WITHOUT AND WITH CONTRAST TECHNIQUE: Multiplanar and multiecho pulse sequences of the thoracic and lumbar spine were obtained without and with intravenous contrast. CONTRAST:  1031mADAVIST GADOBUTROL 1 MMOL/ML IV SOLN COMPARISON:  CT thoracic lumbar 12/14/2018 FINDINGS: MRI THORACIC SPINE FINDINGS Alignment:  Normal. Image quality degraded by moderate to extensive motion. Vertebrae: Negative for fracture or mass. Normal enhancement of the spine. Cord: Limited  cord evaluation due to motion. No cord lesion or cord compression identified.  Paraspinal and other soft tissues: Negative Disc levels: Negative for disc protrusion or spinal stenosis. MRI LUMBAR SPINE FINDINGS Segmentation:  Normal Motion degraded study. There is progressive motion on the study which becomes more severe on the axial images. Alignment:  Normal Vertebrae:  Negative for fracture or mass. Conus medullaris and cauda equina: Conus extends to the T12-L1 level. Conus and cauda equina appear normal. Paraspinal and other soft tissues: Negative for paraspinous mass or adenopathy. Normal soft tissue enhancement. Disc levels: No significant abnormality at L3-4 or above Mild disc bulging and mild facet degeneration L4-5 and L5-S1. Negative for disc protrusion or stenosis IMPRESSION: Motion degraded study, worse on the thoracic compared to the lumbar spine. No acute thoracic abnormality. Negative for fracture, infection, or spinal stenosis. No acute lumbar abnormality. Mild disc and facet degeneration L4-5 and L5-S1 without stenosis. Electronically Signed   By: Franchot Gallo M.D.   On: 12/19/2018 12:05   MR Lumbar Spine W Wo Contrast  Result Date: 12/19/2018 CLINICAL DATA:  Quadriplegia EXAM: MRI THORACIC AND LUMBAR SPINE WITHOUT AND WITH CONTRAST TECHNIQUE: Multiplanar and multiecho pulse sequences of the thoracic and lumbar spine were obtained without and with intravenous contrast. CONTRAST:  2m GADAVIST GADOBUTROL 1 MMOL/ML IV SOLN COMPARISON:  CT thoracic lumbar 12/14/2018 FINDINGS: MRI THORACIC SPINE FINDINGS Alignment:  Normal. Image quality degraded by moderate to extensive motion. Vertebrae: Negative for fracture or mass. Normal enhancement of the spine. Cord: Limited cord evaluation due to motion. No cord lesion or cord compression identified. Paraspinal and other soft tissues: Negative Disc levels: Negative for disc protrusion or spinal stenosis. MRI LUMBAR SPINE FINDINGS Segmentation:  Normal  Motion degraded study. There is progressive motion on the study which becomes more severe on the axial images. Alignment:  Normal Vertebrae:  Negative for fracture or mass. Conus medullaris and cauda equina: Conus extends to the T12-L1 level. Conus and cauda equina appear normal. Paraspinal and other soft tissues: Negative for paraspinous mass or adenopathy. Normal soft tissue enhancement. Disc levels: No significant abnormality at L3-4 or above Mild disc bulging and mild facet degeneration L4-5 and L5-S1. Negative for disc protrusion or stenosis IMPRESSION: Motion degraded study, worse on the thoracic compared to the lumbar spine. No acute thoracic abnormality. Negative for fracture, infection, or spinal stenosis. No acute lumbar abnormality. Mild disc and facet degeneration L4-5 and L5-S1 without stenosis. Electronically Signed   By: CFranchot GalloM.D.   On: 12/19/2018 12:05   CT ABDOMEN PELVIS W CONTRAST  Result Date: 12/22/2018 CLINICAL DATA:  Persistent ileus. EXAM: CT ABDOMEN AND PELVIS WITH CONTRAST TECHNIQUE: Multidetector CT imaging of the abdomen and pelvis was performed using the standard protocol following bolus administration of intravenous contrast. CONTRAST:  1012mOMNIPAQUE IOHEXOL 300 MG/ML  SOLN COMPARISON:  12/03/2018 FINDINGS: Lower chest: Normal heart size. Airspace disease in both lower lungs, most confluent in the medial left lower lobe. Hepatobiliary: Hepatic steatosis which is diffuse and prominent.No evidence of biliary obstruction or stone. Pancreas: Unremarkable. Spleen: Unremarkable. Adrenals/Urinary Tract: Negative adrenals. No hydronephrosis or stone. Bladder is decompressed by Foley catheter. Stomach/Bowel: Fluid levels seen throughout small and large bowel with moderate distention of bowel loops. A rectal tube is in place. Maximal colonic thickening is at the ascending segment-8.4 cm. No bowel wall thickening. New high-density within the appendix attributed to barium retention.  There is mild widening of the midportion appendix that is stable, no periappendiceal inflammation. Enteric tube tip in good position at the proximal stomach. Vascular/Lymphatic: No acute vascular  abnormality. No mass or adenopathy. Reproductive:Negative Other: No ascites or pneumoperitoneum. Musculoskeletal: No acute abnormalities. IMPRESSION: 1. Adynamic ileus pattern that has progressed from 12/07/2018. 2. Hepatic steatosis. 3. Pneumonia both lung bases, progressed from comparison. Electronically Signed   By: Monte Fantasia M.D.   On: 12/22/2018 15:42   CT ABDOMEN PELVIS W CONTRAST  Result Date: 12/24/2018 CLINICAL DATA:  Abdominal pain. Shortness of breath. EXAM: CT ABDOMEN AND PELVIS WITH CONTRAST TECHNIQUE: Multidetector CT imaging of the abdomen and pelvis was performed using the standard protocol following bolus administration of intravenous contrast. CONTRAST:  175m OMNIPAQUE IOHEXOL 350 MG/ML SOLN COMPARISON:  11/24/2018 FINDINGS: Lower chest: New airspace opacity in the left lower lobe suspicious for pneumonia. Air fluid level in the distal esophagus probably from reflux. Hepatobiliary: Diffuse hepatic steatosis is suspected. Otherwise unremarkable. Pancreas: Unremarkable Spleen: Unremarkable Adrenals/Urinary Tract: Unremarkable Stomach/Bowel: There are a few mildly dilated loops of small bowel measuring up to 3.5 cm in diameter, with scattered air-fluid levels but without an obvious transition point or site of obstruction. Vascular/Lymphatic: Borderline prominent right gastric node 0.9 cm in short axis on image 24/3, previously 0.8 cm. Patient has an unusually small in caliber abdominal aorta, measuring 0.9 cm in diameter. Common origin of the celiac trunk and SMA. No obvious periaortic inflammatory findings. Reproductive: Unremarkable Other: No supplemental non-categorized findings. Musculoskeletal: Unremarkable IMPRESSION: 1. New airspace opacity in the left lower lobe suspicious for pneumonia.  2. There are a few mildly dilated loops of small bowel with scattered air-fluid levels but without an obvious transition point or site of obstruction. Ileus is favored over low-grade partial small bowel obstruction. 3. Diffuse hepatic steatosis. 4. Air fluid level in the distal esophagus probably from reflux. 5. Small caliber abdominal aorta, only about 0.9 cm in diameter, without periaortic inflammatory findings. 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 Consider follow up vascular surgical consultation. Electronically Signed   By: WVan ClinesM.D.   On: 12/17/2018 19:15   IR GASTROSTOMY TUBE MOD SED  Result Date: 01/02/2019 INDICATION: Dysphagia. Please place percutaneous gastrostomy tube for enteric nutrition supplementation purposes. EXAM: PULL TROUGH GASTROSTOMY TUBE PLACEMENT COMPARISON:  CT abdomen pelvis-12/22/2018 MEDICATIONS: Patient is currently admitted to the hospital and receiving intravenous antibiotics; Antibiotics were administered within 1 hour of the procedure. Glucagon 1 mg IV CONTRAST:  30 mL of Isovue-300 administered into the gastric lumen. ANESTHESIA/SEDATION: Moderate (conscious) sedation was employed during this procedure. A total of Versed 1 mg and Fentanyl 50 mcg was administered intravenously. Moderate Sedation Time: 10 minutes. The patient's level of consciousness and vital signs were monitored continuously by radiology nursing throughout the procedure under my direct supervision. FLUOROSCOPY TIME:  3 minutes, 30 seconds (89 mGy) COMPLICATIONS: None immediate. PROCEDURE: Informed written consent was obtained from the patient following explanation of the procedure, risks, benefits and alternatives. A time out was performed prior to the initiation of the procedure. Ultrasound scanning was performed to demarcate the edge of the left lobe of the liver. Maximal barrier sterile technique utilized including caps, mask, sterile gowns, sterile gloves, large sterile drape, hand  hygiene and Betadine prep. The left upper quadrant was sterilely prepped and draped. An oral gastric catheter was inserted into the stomach under fluoroscopy. The existing nasogastric feeding tube was removed. The left costal margin and barium/air opacified transverse colon were identified and avoided. Air was injected into the stomach for insufflation and visualization under fluoroscopy. Under sterile conditions a 17 gauge trocar needle was utilized to access the stomach percutaneously  beneath the left subcostal margin after the overlying soft tissues were anesthetized with 1% Lidocaine with epinephrine. Needle position was confirmed within the stomach with aspiration of air and injection of small amount of contrast. A single T tack was deployed for gastropexy. Over an Amplatz guide wire, a 9-French sheath was inserted into the stomach. A snare device was utilized to capture the oral gastric catheter. The snare device was pulled retrograde from the stomach up the esophagus and out the oropharynx. The 20-French pull-through gastrostomy was connected to the snare device and pulled antegrade through the oropharynx down the esophagus into the stomach and then through the percutaneous tract external to the patient. The gastrostomy was assembled externally. Contrast injection confirms position in the stomach. Several spot radiographic images were obtained in various obliquities for documentation. The patient tolerated procedure well without immediate post procedural complication. FINDINGS: After successful fluoroscopic guided placement, the gastrostomy tube is appropriately positioned with internal disc against the ventral aspect of the gastric lumen. IMPRESSION: Successful fluoroscopic insertion of a 20-French pull-through gastrostomy tube. The gastrostomy may be used immediately for medication administration and in 24 hrs for the initiation of feeds. Electronically Signed   By: Sandi Mariscal M.D.   On: 01/02/2019 10:14     DG CHEST PORT 1 VIEW  Result Date: 12/31/2018 CLINICAL DATA:  23 year old male with a history aspiration EXAM: PORTABLE CHEST 1 VIEW COMPARISON:  12/30/2018, 12/28/2018 FINDINGS: Cardiomediastinal silhouette unchanged in size and contour. Low lung volumes persist with linear opacities at the lung bases. No pneumothorax or large pleural effusion. Crowding of the interstitium and the vasculature. Enteric tube projects over the mediastinum and terminates out of the field of view. IMPRESSION: Unchanged appearance of the chest x-ray with low lung volumes and likely atelectasis. Unchanged enteric feeding tube. Electronically Signed   By: Corrie Mckusick D.O.   On: 12/31/2018 07:45   DG CHEST PORT 1 VIEW  Result Date: 12/30/2018 CLINICAL DATA:  Reason for exam: Vomiting, aspiration into airway Hx HTN, pre-diabetes, fatty liver, pancreatitis, pneumonia EXAM: PORTABLE CHEST - 1 VIEW COMPARISON:  12/28/2018 FINDINGS: Feeding tube remains in place. Persistent low lung volumes with coarse perihilar and bibasilar interstitial markings. No new infiltrate or edema. Heart size and mediastinal contours are within normal limits. No effusion. Visualized bones unremarkable. IMPRESSION: 1. Stable chest x-ray. No new infiltrate or edema. 2. Stable appearance of the cardiomediastinal silhouette. Electronically Signed   By: Lucrezia Europe M.D.   On: 12/30/2018 09:42   DG CHEST PORT 1 VIEW  Result Date: 12/28/2018 CLINICAL DATA:  Updated status.  Fever. EXAM: PORTABLE CHEST 1 VIEW COMPARISON:  December 26, 2018 FINDINGS: The feeding tube terminates below today's film. Mild opacity in left base is somewhat streaky in platelike laterally. Low lung volumes. No pneumothorax. The lungs are otherwise clear. The cardiomediastinal silhouette is stable. IMPRESSION: Streaky opacity in left base is favored to represent atelectasis given the platelike configuration laterally. Developing infiltrate not completely excluded. Recommend attention on  follow-up. 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 EXAM: PORTABLE CHEST 1 VIEW COMPARISON:  Portable chest 12/21/2018. FINDINGS: Portable AP semi upright view at 0052 hours. Extubated and NG type tube removed since 12/21/2018. Lower lung volumes. Mediastinal contours remain normal. There is an enteric feeding tube in place which courses to the abdomen and  probably crosses midline to the right but the tip is not included. Increased crowding of lung markings and/or pulmonary vascularity without overt edema. No pneumothorax, pleural effusion or consolidation. Stable visible bowel gas pattern. IMPRESSION: 1. Extubated with lower lung volumes. No other acute cardiopulmonary abnormality. 2. Enteric feeding tube courses to the abdomen, tip not included. Electronically Signed   By: Genevie Ann M.D.   On: 12/26/2018 01:08   DG CHEST PORT 1 VIEW  Result Date: 12/21/2018 CLINICAL DATA:  23 year old male with history of central line and nasogastric tube placement. EXAM: PORTABLE CHEST 1 VIEW COMPARISON:  Chest x-ray 12/21/2018. FINDINGS: An endotracheal tube is in place with tip 4.1 cm above the carina. There is a right-sided internal jugular central venous catheter with tip terminating in the distal superior vena cava. Nasogastric tube extending into the proximal stomach with side port just distal to the gastroesophageal junction. No pneumothorax. Lung volumes are low. Ill-defined opacities in the medial aspect of the left lung base and medial aspect of the right upper lobe. No pleural effusions. No evidence of pulmonary edema. Heart size is normal. IMPRESSION: 1. Support apparatus, as above. 2. Ill-defined opacities in the medial left lower lobe and medial right upper lobe concerning for multilobar pneumonia. Electronically Signed   By: Vinnie Langton  M.D.   On: 12/21/2018 17:22   DG CHEST PORT 1 VIEW  Result Date: 12/21/2018 CLINICAL DATA:  Aspiration, possible seizure EXAM: PORTABLE CHEST 1 VIEW COMPARISON:  12/20/2018 FINDINGS: No significant change in low volume AP portable examination without significant acute appearing airspace opacity. Esophagogastric tube remains with tip and side port below the diaphragm. Heart and mediastinum are unremarkable. IMPRESSION: No significant change in low volume portable chest x-ray. No new findings. Electronically Signed   By: Eddie Candle M.D.   On: 12/21/2018 11:32   DG CHEST PORT 1 VIEW  Result Date: 12/20/2018 CLINICAL DATA:  Fever. History of hypertension seizures and pancreatitis. EXAM: PORTABLE CHEST 1 VIEW COMPARISON:  12/16/2018 and older exams. FINDINGS: Lung volumes are low. Atelectasis in the medial lung bases. Lungs otherwise clear. No pleural effusion or pneumothorax. Cardiac silhouette normal in size.  No mediastinal or hilar masses. Nasogastric tube passes below the diaphragm well into the stomach. IMPRESSION: 1. No acute cardiopulmonary disease. Electronically Signed   By: Lajean Manes M.D.   On: 12/20/2018 13:39   DG CHEST PORT 1 VIEW  Result Date: 12/16/2018 CLINICAL DATA:  Shortness of breath. EXAM: PORTABLE CHEST 1 VIEW COMPARISON:  December 15, 2018. FINDINGS: Again noted are low lung volumes with bibasilar atelectasis. The cardiac silhouette remains enlarged. There is no pneumothorax. No large pleural effusion. No acute osseous abnormality. An enteric tube is noted. IMPRESSION: Stable appearance of the chest. Electronically Signed   By: Constance Holster M.D.   On: 12/16/2018 15:02   DG Chest Port 1 View  Result Date: 12/15/2018 CLINICAL DATA:  Respiratory failure. EXAM: PORTABLE CHEST 1 VIEW COMPARISON:  12/14/2018 FINDINGS: 0514 hours. Low lung volumes. Interval improvement in left basilar aeration. The lungs are clear without focal pneumonia, edema, pneumothorax or pleural  effusion. Cardiopericardial silhouette accentuated by low volume AP technique. NG tube tip is in the mid stomach. Telemetry leads overlie the chest. IMPRESSION: Low volume film without acute cardiopulmonary findings. Electronically Signed   By: Misty Stanley M.D.   On: 12/15/2018 07:37   DG CHEST PORT 1 VIEW  Result Date: 12/14/2018 CLINICAL DATA:  Shortness of breath. EXAM: PORTABLE CHEST 1 VIEW COMPARISON:  Chest radiograph 12/10/2018, abdominal radiograph 12/12/2018 FINDINGS: Enteric tube is been retracted, tip now in the distal esophagus, side-port in the mid esophagus. Low lung volumes similar to prior exam. Slight increasing patchy opacities at the left lung base. Unchanged heart size and mediastinal contours. No pleural fluid or pneumothorax. IMPRESSION: 1. Enteric tube has been retracted, tip now in the distal esophagus, side-port in the mid esophagus. Recommend advancement of least 10 cm place the side-port below the diaphragm. 2. Persistent low lung volumes. Increasing patchy opacities at the left lung base, may be atelectasis or pneumonia. Electronically Signed   By: Keith Rake M.D.   On: 12/14/2018 05:22   DG CHEST PORT 1 VIEW  Result Date: 12/10/2018 CLINICAL DATA:  Dyspnea. EXAM: PORTABLE CHEST 1 VIEW COMPARISON:  December 09, 2018. FINDINGS: The heart size and mediastinal contours are within normal limits. No pneumothorax or pleural effusion is noted. Hypoinflation of the lungs is noted with minimal bibasilar subsegmental atelectasis. The visualized skeletal structures are unremarkable. IMPRESSION: Hypoinflation of the lungs with minimal bibasilar subsegmental atelectasis. Electronically Signed   By: Marijo Conception M.D.   On: 12/10/2018 09:17   DG CHEST PORT 1 VIEW  Result Date: 12/09/2018 CLINICAL DATA:  Tachypnea, low-grade fever EXAM: PORTABLE CHEST 1 VIEW COMPARISON:  12/23/2018 FINDINGS: Low lung volumes. Heart size is accentuated by the low volumes. Bibasilar atelectasis.  No effusions. No acute bony abnormality. IMPRESSION: Low lung volumes, bibasilar atelectasis. Electronically Signed   By: Rolm Baptise M.D.   On: 12/09/2018 21:38   DG Chest Port 1 View  Result Date: 11/25/2018 CLINICAL DATA:  Sepsis. EXAM: PORTABLE CHEST 1 VIEW COMPARISON:  11/21/2018 FINDINGS: A very poor inspiration is again demonstrated. The heart remains grossly normal in size. Interval minimal patchy and linear density in the left lower lobe. Clear right lung. Mild peribronchial thickening. Unremarkable bones. IMPRESSION: 1. Interval minimal patchy and linear atelectasis or pneumonia in the left lower lobe. 2. Mild bronchitic changes. 3. Stable very poor inspiration. Electronically Signed   By: Claudie Revering M.D.   On: 12/20/2018 15:47   DG Abd Portable 1V  Result Date: 01/02/2019 CLINICAL DATA:  Severe sepsis. EXAM: PORTABLE ABDOMEN - 1 VIEW COMPARISON:  12/30/2018 FINDINGS: Air is present throughout the colon. Contrast is present over the right colon, terminal ileum and appendix. Small caliber probe projects over the rectum. Enteric tube is present with tip in the midline of the mid abdomen likely over the distal duodenum near the duodenal jejunal junction. Remainder the exam is unchanged IMPRESSION: 1.  Nonobstructive bowel gas pattern as described. 2. Enteric tube with tip unchanged just left of midline in the upper abdomen likely over the third/fourth portion of the duodenum. Electronically Signed   By: Marin Olp M.D.   On: 01/02/2019 08:03   DG Abd Portable 1V  Result Date: 12/30/2018 CLINICAL DATA:  Ileus EXAM: PORTABLE ABDOMEN - 1 VIEW COMPARISON:  Portable exam 1715 hours compared to 0906 hours FINDINGS: Tip of feeding tube projects over the third portion of duodenum approaching ligament of Treitz. Scattered gas throughout bowel, decreased from prior exam. No bowel dilatation or bowel wall thickening. Osseous structures unremarkable. No urinary tract calcifications. IMPRESSION:  Nonspecific bowel gas pattern. Decreased gaseous distention of bowel since prior study. Electronically Signed   By: Lavonia Dana M.D.   On: 12/30/2018 19:20   DG Abd Portable 1V  Result Date: 12/25/2018 CLINICAL DATA:  Feeding tube placement. EXAM: PORTABLE ABDOMEN - 1 VIEW COMPARISON:  December 24, 2018. FINDINGS: The bowel gas pattern is normal. Distal tip of feeding tube is seen in expected position of proximal duodenum. No radio-opaque calculi or other significant radiographic abnormality are seen. IMPRESSION: Negative. Electronically Signed   By: Marijo Conception M.D.   On: 12/25/2018 16:06   DG Abd Portable 1V  Result Date: 12/21/2018 CLINICAL DATA:  Nasogastric tube placement. EXAM: PORTABLE ABDOMEN - 1 VIEW COMPARISON:  December 20, 2018. FINDINGS: Mildly dilated air-filled large and small bowel loops are noted which may represent ileus. Distal tip of nasogastric tube is seen in proximal stomach. IMPRESSION: Distal tip of nasogastric tube seen in proximal stomach. Mildly dilated air-filled large and small bowel loops are noted which may represent ileus. Electronically Signed   By: Marijo Conception M.D.   On: 12/21/2018 17:22   DG Abd Portable 1V  Result Date: 12/20/2018 CLINICAL DATA:  NG placement EXAM: PORTABLE ABDOMEN - 1 VIEW COMPARISON:  Radiograph 12/20/2018 FINDINGS: Transesophageal tube tip and side port distal to the GE junction, curling in the left upper quadrant in the region of the gastric body. Atelectatic changes in the otherwise clear lung bases. Gaseous distention of what appears to be the colon without high-grade bowel obstruction. IMPRESSION: 1. Transesophageal tube tip and side port in the region of the gastric body. 2. Gaseous distention of the colon without high-grade bowel obstruction. Electronically Signed   By: Lovena Le M.D.   On: 12/20/2018 02:45   DG Abd Portable 1V  Result Date: 12/20/2018 CLINICAL DATA:  Check gastric catheter placement EXAM: PORTABLE ABDOMEN -  1 VIEW COMPARISON:  12/20/2018 FINDINGS: Gastric catheter has been advanced with the tip in the stomach. Proximal side port lies at the gastroesophageal junction. This could be advanced several cm. The lungs are clear. IMPRESSION: Gastric catheter as described. The proximal side port is not within the stomach and should be advanced several cm. Electronically Signed   By: Inez Catalina M.D.   On: 12/20/2018 01:50   DG Abd Portable 1V  Result Date: 12/20/2018 CLINICAL DATA:  NG tube placement EXAM: PORTABLE ABDOMEN - 1 VIEW COMPARISON:  12/16/2018 abdominal radiograph FINDINGS: The side port of the NG tube is in the distal esophagus. Recommend advancing by 7-10 cm. Lungs are clear. Nonobstructive bowel gas pattern. IMPRESSION: 1. Side-port of the nasogastric tube in the distal esophagus. Recommend advancing by 7-10 cm. 2. No active cardiopulmonary disease. Electronically Signed   By: Ulyses Jarred M.D.   On: 12/20/2018 00:41   DG Abd Portable 1V  Result Date: 12/12/2018 CLINICAL DATA:  NG tube placement. EXAM: PORTABLE ABDOMEN - 1 VIEW COMPARISON:  Radiograph dated 12/10/2018 FINDINGS: NG tube has been inserted and the tip is in the midbody of the stomach. The visualized bowel gas pattern is normal. No visible acute bone abnormality. IMPRESSION: NG tube in the body of the stomach. Electronically Signed   By: Lorriane Shire M.D.   On: 12/12/2018 21:18   DG Abd Portable 1V  Result Date: 12/10/2018 CLINICAL DATA:  Dyspnea, abdominal pain and distention EXAM: PORTABLE ABDOMEN - 1 VIEW COMPARISON:  11/28/2018 abdominal radiograph FINDINGS: There are mildly dilated small bowel loops throughout the abdomen measuring up to 3.9 cm diameter in the left abdomen, mildly decreased in caliber. No evidence of pneumatosis or pneumoperitoneum. No radiopaque nephrolithiasis. IMPRESSION: Diffuse mild small bowel dilatation, mildly decreased, which could represent either improving adynamic ileus or improving partial distal  small bowel obstruction. Electronically Signed   By: Rinaldo Ratel  Poff M.D.   On: 12/10/2018 09:19   DG Abd Portable 1V  Result Date: 12/09/2018 CLINICAL DATA:  Nasogastric tube placement. EXAM: PORTABLE ABDOMEN - 1 VIEW COMPARISON:  12/15/2018 FINDINGS: No nasogastric tube seen in the lower chest or upper abdomen. Multiple dilated small bowel loops with mild improvement. Gas is again demonstrated in normal caliber colon. Mild scoliosis. IMPRESSION: 1. No nasogastric tube seen in the lower chest or upper abdomen. 2. Mildly improved small bowel obstruction. Electronically Signed   By: Claudie Revering M.D.   On: 12/09/2018 11:40   DG Abd Portable 1V  Result Date: 11/28/2018 CLINICAL DATA:  Sepsis. Fever. EXAM: PORTABLE ABDOMEN - 1 VIEW COMPARISON:  11/23/2018 and abdomen and pelvis CT dated 11/24/2018. FINDINGS: Interval multiple mildly dilated loops of proximal small bowel with gas-filled normal caliber distal small bowel and colon. Unremarkable bones. IMPRESSION: Interval mild small bowel ileus or partial obstruction. No gross free air seen at this time. Electronically Signed   By: Claudie Revering M.D.   On: 12/06/2018 15:49   EEG adult  Result Date: 12/21/2018 Lora Havens, MD     12/21/2018 12:59 PM Patient Name:Octavion Verl Dicker TFT:[id number] Epilepsy Attending:Priyanka Barbra Sarks Referring Physician/Provider:Dr Kathrynn Speed Date: 12/21/2018 Duration: 23.02 minutes  Patient history:23 year old male with known epilepsy who was noted to have episode of extension of arm with eyes rolling upwards concerning for seizure.. EEG to evaluate for seizures.   Level of alertness:Awake  AEDs during EEG study:Keppra, Onfi  Technical aspects: This EEG study was done with scalp electrodes positioned according to the 10-20 International system of electrode placement. Electrical activity was acquired at a sampling rate of '500Hz'$  and reviewed with a high frequency filter of '70Hz'$  and a low frequency filter of  '1Hz'$ . EEG data were recorded continuously and digitally stored.  Description:During awake state, no clear posterior dominant rhythm was seen. EEG showed continuous generalized 5 to 6 Hz theta slowing admixed withan excessive amount of 15 to 18 Hz, 2-3 uV beta activity with irregular morphology distributed symmetrically and diffusely. Hyperventilation and photic stimulation were not performed.  Abnormality -Continuous slow, generalized -Excessive beta, generalized    IMPRESSION: This study is suggestive of mild to moderate diffuse encephalopathy, nonspecific to etiology. No seizures or epileptiform discharges were seen throughout the recording.  The excessive beta activity seen in the background is most likely due to the effect of benzodiazepine and is a benign EEG pattern.  Priyanka Barbra Sarks  Overnight EEG with video  Result Date: 12/22/2018 Lora Havens, MD     12/23/2018  9:31 AM Patient Name:Demarquis Verl Dicker HCW:[id number] Epilepsy Attending:Priyanka Barbra Sarks Referring Physician/Provider:Dr Kathrynn Speed Duration: 12/21/2018 1231 to 12/22/2018 1231  Patient history:23 year old male with known epilepsy who was noted to have episode of extension of arm with eyes rolling upwards concerning for seizure. EEG to evaluate for seizures.   Level of alertness:Awake, asleep  AEDs during EEG study:Keppra, Onfi  Technical aspects: This EEG study was done with scalp electrodes positioned according to the 10-20 International system of electrode placement. Electrical activity was acquired at a sampling rate of '500Hz'$  and reviewed with a high frequency filter of '70Hz'$  and a low frequency filter of '1Hz'$ . EEG data were recorded continuously and digitally stored.  Description:During awake state, no clear posterior dominant rhythm was seen. Sleep was characterized by vertex waves, sleep spindles (12-'14Hz'$ ), maximal frontocentral.EEG showed continuous generalized 5 to 6 Hz theta slowing admixed  withan excessive amount of 15 to 18 Hz, 2-3 uV beta  activity with irregular morphology distributed symmetrically and diffusely. Hyperventilation and photic stimulation were not performed.  Abnormality -Continuous slow, generalized -Excessive beta, generalized   IMPRESSION: This study is suggestive of mild to moderate diffuse encephalopathy, nonspecific to etiology. No seizures or epileptiform discharges were seen throughout the recording.  The excessive beta activity seen in the background is most likely due to the effect of benzodiazepine and is a benign EEG pattern. Priyanka Barbra Sarks   Overnight EEG with video  Result Date: 12/11/2018 Lora Havens, MD     12/22/2018  8:54 AM Patient Name: Sherrell Farish MRN: [id number] Epilepsy Attending: Lora Havens Referring Physician/Provider: Dr [name] Duration: 12/10/2018 1946 to 12/11/2018 1218  Patient history: 23 year old male with known epilepsy presented with altered mental status.  EEG to evaluate for seizures.   Level of alertness: Awake, asleep  AEDs during EEG study: Keppra, Onfi  Technical aspects: This EEG study was done with scalp electrodes positioned according to the 10-20 International system of electrode placement. Electrical activity was acquired at a sampling rate of '500Hz'$  and reviewed with a high frequency filter of '70Hz'$  and a low frequency filter of '1Hz'$ . EEG data were recorded continuously and digitally stored.  Description: During awake state, no clear posterior dominant rhythm was seen.  Sleep was characterized by vertex waves, sleep spindles (12 to 14 Hz), maximal frontocentral.  EEG showed continuous generalized 5 to 6 Hz theta slowing admixed with an excessive amount of 15 to 18 Hz, 2-3 uV beta activity with irregular morphology distributed symmetrically and diffusely.  Hyperventilation and photic stimulation were not performed.  Abnormality -Continuous slow, generalized -Excessive beta, generalized     IMPRESSION:  This study is suggestive of mild diffuse encephalopathy, nonspecific to etiology. No seizures or epileptiform discharges were seen throughout the recording.  The excessive beta activity seen in the background is most likely due to the effect of benzodiazepine and is a benign EEG pattern.  Lora Havens   ECHOCARDIOGRAM COMPLETE  Result Date: 12/12/2018   ECHOCARDIOGRAM REPORT   Patient Name:   EMETT STAPEL Date of Exam: 12/12/2018 Medical Rec #:  [id number]      Height:       71.0 in Accession #:    [id number]     Weight:       298.0 lb Date of Birth:  May 01, 1995       BSA:          2.50 m Patient Age:    23 years       BP:           110/86 mmHg Patient Gender: M              HR:           124 bpm. Exam Location:  Inpatient Procedure: 2D Echo and Intracardiac Opacification Agent Indications:    Abnormal ECG 794.31/R94.31  History:        Patient has no prior history of Echocardiogram examinations.  Sonographer:    Clayton Lefort RDCS (AE) Referring Phys: [id number] [name]  Sonographer Comments: Technically difficult study due to poor echo windows, suboptimal apical window, suboptimal parasternal window and patient is morbidly obese. Image acquisition challenging due to patient body habitus. IMPRESSIONS  1. Left ventricular ejection fraction, by visual estimation, is 55 to 60%. The left ventricle has normal function. There is mildly increased left ventricular hypertrophy.  2. Definity contrast agent was given IV to delineate the left ventricular endocardial borders.  3. The left ventricle has no regional wall motion abnormalities.  4. Indeterminate diastolic filling due to E-A fusion.  5. Global right ventricle has normal systolic function.The right ventricular size is normal. No increase in right ventricular wall thickness.  6. Left atrial size was normal.  7. Right atrial size was normal.  8. The mitral valve is normal in structure. No evidence of mitral valve regurgitation. No evidence of mitral  stenosis.  9. The tricuspid valve is normal in structure. Tricuspid valve regurgitation is not demonstrated. 10. The aortic valve is tricuspid. Aortic valve regurgitation is not visualized. No evidence of aortic valve sclerosis or stenosis. 11. TR signal is inadequate for assessing pulmonary artery systolic pressure. 12. Technically difficult study with poor acoustic windows. FINDINGS  Left Ventricle: Left ventricular ejection fraction, by visual estimation, is 55 to 60%. The left ventricle has normal function. Definity contrast agent was given IV to delineate the left ventricular endocardial borders. The left ventricle has no regional wall motion abnormalities. The left ventricular internal cavity size was the left ventricle is normal in size. There is mildly increased left ventricular hypertrophy. Indeterminate diastolic filling due to E-A fusion. Right Ventricle: The right ventricular size is normal. No increase in right ventricular wall thickness. Global RV systolic function is has normal systolic function. Left Atrium: Left atrial size was normal in size. Right Atrium: Right atrial size was normal in size Pericardium: There is no evidence of pericardial effusion. Mitral Valve: The mitral valve is normal in structure. No evidence of mitral valve stenosis by observation. No evidence of mitral valve regurgitation. Tricuspid Valve: The tricuspid valve is normal in structure. Tricuspid valve regurgitation is not demonstrated. Aortic Valve: The aortic valve is tricuspid. Aortic valve regurgitation is not visualized. The aortic valve is structurally normal, with no evidence of sclerosis or stenosis. Aortic valve mean gradient measures 5.0 mmHg. Aortic valve peak gradient measures 8.3 mmHg. Aortic valve area, by VTI measures 2.58 cm. Pulmonic Valve: The pulmonic valve was normal in structure. Pulmonic valve regurgitation is not visualized. Aorta: The aortic root is normal in size and structure. Venous: The inferior  vena cava was not well visualized. IAS/Shunts: No atrial level shunt detected by color flow Doppler.  LEFT VENTRICLE PLAX 2D LVIDd:         4.14 cm LVIDs:         3.35 cm LV PW:         1.38 cm LV IVS:        1.32 cm LVOT diam:     2.10 cm LV SV:         30 ml LV SV Index:   11.34 LVOT Area:     3.46 cm  RIGHT VENTRICLE RV Basal diam:  3.34 cm RV S prime:     23.10 cm/s TAPSE (M-mode): 2.8 cm LEFT ATRIUM             Index       RIGHT ATRIUM           Index LA diam:        4.00 cm 1.60 cm/m  RA Area:     18.00 cm LA Vol (A2C):   50.6 ml 20.26 ml/m RA Volume:   49.50 ml  19.82 ml/m LA Vol (A4C):   56.1 ml 22.46 ml/m LA Biplane Vol: 55.1 ml 22.06 ml/m  AORTIC VALVE AV Area (Vmax):    2.69 cm AV Area (Vmean):   2.33 cm AV Area (VTI):  2.58 cm AV Vmax:           144.00 cm/s AV Vmean:          104.000 cm/s AV VTI:            0.234 m AV Peak Grad:      8.3 mmHg AV Mean Grad:      5.0 mmHg LVOT Vmax:         112.00 cm/s LVOT Vmean:        70.000 cm/s LVOT VTI:          0.174 m LVOT/AV VTI ratio: 0.74  AORTA Ao Root diam: 2.20 cm  SHUNTS Systemic VTI:  0.17 m Systemic Diam: 2.10 cm  Loralie Champagne MD Electronically signed by Loralie Champagne MD Signature Date/Time: 12/12/2018/5:14:36 PM    Final    VAS [place] LOWER EXTREMITY VENOUS (DVT)  Result Date: 12/22/2018  Lower Venous Study Indications: Edema.  Risk Factors: None identified. Limitations: Body habitus and poor ultrasound/tissue interface. Comparison Study: No prior studies. Performing Technologist: Oliver Hum RVT  Examination Guidelines: A complete evaluation includes B-mode imaging, spectral Doppler, color Doppler, and power Doppler as needed of all accessible portions of each vessel. Bilateral testing is considered an integral part of a complete examination. Limited examinations for reoccurring indications may be performed as noted.  +---------+---------------+---------+-----------+----------+--------------+ RIGHT     CompressibilityPhasicitySpontaneityPropertiesThrombus Aging +---------+---------------+---------+-----------+----------+--------------+ CFV      Full           Yes      Yes                                 +---------+---------------+---------+-----------+----------+--------------+ SFJ      Full                                                        +---------+---------------+---------+-----------+----------+--------------+ FV Prox  Full                                                        +---------+---------------+---------+-----------+----------+--------------+ FV Mid   Full                                                        +---------+---------------+---------+-----------+----------+--------------+ FV DistalFull                                                        +---------+---------------+---------+-----------+----------+--------------+ PFV      Full                                                        +---------+---------------+---------+-----------+----------+--------------+ POP  Full           Yes      Yes                                 +---------+---------------+---------+-----------+----------+--------------+ PTV      Full                                                        +---------+---------------+---------+-----------+----------+--------------+ PERO     Full                                                        +---------+---------------+---------+-----------+----------+--------------+   +---------+---------------+---------+-----------+----------+--------------+ LEFT     CompressibilityPhasicitySpontaneityPropertiesThrombus Aging +---------+---------------+---------+-----------+----------+--------------+ CFV      Full           Yes      Yes                                 +---------+---------------+---------+-----------+----------+--------------+ SFJ      Full                                                         +---------+---------------+---------+-----------+----------+--------------+ FV Prox  Full                                                        +---------+---------------+---------+-----------+----------+--------------+ FV Mid   Full                                                        +---------+---------------+---------+-----------+----------+--------------+ FV DistalFull                                                        +---------+---------------+---------+-----------+----------+--------------+ PFV      Full                                                        +---------+---------------+---------+-----------+----------+--------------+ POP      Full           Yes      Yes                                 +---------+---------------+---------+-----------+----------+--------------+  PTV      Full                                                        +---------+---------------+---------+-----------+----------+--------------+ PERO     Full                                                        +---------+---------------+---------+-----------+----------+--------------+     Summary: Right: There is no evidence of deep vein thrombosis in the lower extremity. No cystic structure found in the popliteal fossa. Left: There is no evidence of deep vein thrombosis in the lower extremity. No cystic structure found in the popliteal fossa.  *See table(s) above for measurements and observations. Electronically signed by Curt Jews MD on 12/22/2018 at 9:30:31 AM.    Final    [place] EKG SITE RITE  Result Date: 12/31/2018 If Site Rite image not attached, placement could not be confirmed due to current cardiac rhythm.  DG FL GUIDED LUMBAR PUNCTURE  Result Date: 12/14/2018 CLINICAL DATA:  Lower extremity weakness, decreased PO intake, dehydration, concern for Guillain-Barre syndrome EXAM: DIAGNOSTIC LUMBAR PUNCTURE UNDER FLUOROSCOPIC GUIDANCE FLUOROSCOPY  TIME:  Fluoroscopy Time: 72 seconds Radiation Exposure Index (if provided by the fluoroscopic device): 395.4 mGy Number of Acquired Spot Images: 3 PROCEDURE: Informed consent was obtained from the patient prior to the procedure, including potential complications of headache, allergy, and pain. With the patient prone, the lower back was prepped with Betadine. 1% Lidocaine was used for local anesthesia. Lumbar puncture was performed at the L5-S1 level using a 20 gauge needle with return of initially blood tinged, subsequently clear CSF. Extremely low opening pressure, which was not measured. 10.5 ml of CSF were obtained for laboratory studies. The patient tolerated the procedure well and there were no apparent complications. During the procedure, the patient's NG tube started to withdraw. This was returned to the original position in the gastric cardia and advanced slightly into the mid body of the stomach. IMPRESSION: Successful fluoroscopic guided lumbar puncture at L5-S1. Patient's NG tube readjusted, as described above. Electronically Signed   By: Julian Hy M.D.   On: 12/14/2018 17:19     Time Spent in minutes  40     Desiree Hane M.D on 01/04/2019 at 3:50 PM  To page go to [email or web address] - password [facility]

## 2019-01-04 NOTE — Progress Notes (Signed)
Physical Therapy Treatment Patient Details Name: Aaron Vega MRN: [id number] DOB: 02/14/1995 Today's Date: 01/04/2019    History of Present Illness 23 y.o. male with medical history significant of autism (nonverbal), Tourettes, seizures, obesity, reflux esophagitis, hypertension constipation and sleep apnea. 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 Admitted for lethargy and vomiting. Found to have acute on chronic respiratory failure and intubated 11/27-11/30, ileus, HTN, tachycardia, aspiration, ascending weakness and encephalopathy.    PT Comments    Pt more arousable during PT session today, engaging in eye contact with both PT and mother with some command following noted. Pt also smiling at times during session, and grimacing during heel cord stretching L>R. PT performed PROM of UEs and LEs for pt circulation, pressure relief, and ROM maintenance. PT provided pt's mother with PROM exercise program for pt to initiate over the weekend. Handout administered, reviewed, and practiced with pt's mother.Pt's mother encouraged to let RN know when she plans to do PROM to ensure safety of lines/leads, removal of SCDs/prevalons. Medbridge access code: [email or web address]. Pt's mother is very excited about assisting pt with PROM. Will continue to follow acutely.      Follow Up Recommendations  SNF(Family refusing. Maximize HHPT and pt services if discharges home.)     Equipment Recommendations  Hospital bed;Other (comment)(hoyer lift)    Recommendations for Other Services       Precautions / Restrictions Precautions Precautions: Fall;Other (comment) Precaution Comments: cortrak, rectal tube    Mobility  Bed Mobility Overal bed mobility: Needs Assistance             General bed mobility comments: EOB activity NT- PT with use of HOB elevation to come to more upright chest position, BP  stable and HOB elevated 2x15* at a time  Transfers                    Ambulation/Gait                 Stairs             Wheelchair Mobility    Modified Rankin (Stroke Patients Only)       Balance Overall balance assessment: Needs assistance   Sitting balance-[name]: Zero                                      Cognition Arousal/Alertness: Awake/alert Behavior During Therapy: Flat affect Overall Cognitive Status: Difficult to assess                                 General Comments: nonverbal, but rouses to name calling and cool cloth application to face. Pt with 3 periods of smiling today, and followed cuing to "look towards your mom" at bedside.  PT with attempted use of RUE      Exercises General Exercises - Lower Extremity Ankle Circles/Pumps: PROM;Both;Supine;15 reps(with sustained hold ~5 seconds in end range DF (which is ~ -5* from neutral)) Heel Slides: PROM;Both;Supine;15 reps Hip ABduction/ADduction: PROM;Both;15 reps;Supine Shoulder Exercises Shoulder Flexion: PROM;Both;15 reps;Supine Elbow Flexion: PROM;Both;15 reps;Supine Elbow Extension: PROM;Both;15 reps;Supine Wrist Flexion: PROM;Both;10 reps;Supine Wrist Extension: PROM;Both;5 reps;Supine Digit Composite Flexion: PROM;Both;10 reps;Supine Other Exercises Other Exercises: caregiver training - PROM LE and UE exercises to maintain pt ROM, circulation,  and for pt engagement. Handout administered, reviewed, and practiced with pt's mother. Exercises included: hip and knee flexion/extension in supine, hip abd/add in supine, DF/PF in supine, elbow flexion/extension, and digit flexion. Pt's mother encouraged to let RN know when she plans to do PROM to ensure safety of lines/leads, removal of SCDs/prevalons. Medbridge access code: [email or web address]    General Comments        Pertinent Vitals/Pain Pain Assessment: Faces Faces Pain Scale: Hurts little more Pain  Location: during heel cord stretch L>R Pain Descriptors / Indicators: Discomfort;Grimacing Pain Intervention(s): Limited activity within patient's tolerance;Monitored during session;Repositioned    Home Living                      Prior Function            PT Goals (current goals can now be found in the care plan section) Acute Rehab PT Goals Patient Stated Goal: home per family PT Goal Formulation: With family Time For Goal Achievement: 01/09/19 Potential to Achieve Goals: Poor Progress towards PT goals: Progressing toward goals    Frequency    Min 2X/week      PT Plan Current plan remains appropriate    Co-evaluation              AM-PAC PT "6 Clicks" Mobility   Outcome Measure  Help needed turning from your back to your side while in a flat bed without using bedrails?: Total Help needed moving from lying on your back to sitting on the side of a flat bed without using bedrails?: Total Help needed moving to and from a bed to a chair (including a wheelchair)?: Total Help needed standing up from a chair using your arms (e.g., wheelchair or bedside chair)?: Total Help needed to walk in hospital room?: Total Help needed climbing 3-5 steps with a railing? : Total 6 Click Score: 6    End of Session Equipment Utilized During Treatment: Gait belt Activity Tolerance: Patient limited by lethargy;Patient tolerated treatment well Patient left: in bed;with call bell/phone within reach;with family/visitor present;with SCD's reapplied;Other (comment)(with prevalon boots donned) Nurse Communication: Mobility status;Need for lift equipment PT Visit Diagnosis: Other abnormalities of gait and mobility (R26.89);Muscle weakness (generalized) (M62.81)     Time: 8413-2440 PT Time Calculation (min) (ACUTE ONLY): 32 min  Charges:  $Therapeutic Exercise: 23-37 mins                     Ellwood Steidle E, PT Acute Rehabilitation Services Pager [phone]  Office  [phone]    Satcha Storlie D Jacobi Ryant 01/04/2019, 11:15 AM

## 2019-01-05 ENCOUNTER — Inpatient Hospital Stay (HOSPITAL_COMMUNITY)

## 2019-01-05 DIAGNOSIS — R0902 Hypoxemia: Secondary | ICD-10-CM

## 2019-01-05 DIAGNOSIS — D72829 Elevated white blood cell count, unspecified: Secondary | ICD-10-CM

## 2019-01-05 LAB — CBC
HCT: 33.3 % — ABNORMAL LOW (ref 39.0–52.0)
Hemoglobin: 10.3 g/dL — ABNORMAL LOW (ref 13.0–17.0)
MCH: 28.5 pg (ref 26.0–34.0)
MCHC: 30.9 g/dL (ref 30.0–36.0)
MCV: 92 fL (ref 80.0–100.0)
Platelets: 451 10*3/uL — ABNORMAL HIGH (ref 150–400)
RBC: 3.62 MIL/uL — ABNORMAL LOW (ref 4.22–5.81)
RDW: 17.3 % — ABNORMAL HIGH (ref 11.5–15.5)
WBC: 13.2 10*3/uL — ABNORMAL HIGH (ref 4.0–10.5)
nRBC: 0 % (ref 0.0–0.2)

## 2019-01-05 LAB — BASIC METABOLIC PANEL
Anion gap: 8 (ref 5–15)
BUN: 14 mg/dL (ref 6–20)
CO2: 29 mmol/L (ref 22–32)
Calcium: 9.4 mg/dL (ref 8.9–10.3)
Chloride: 101 mmol/L (ref 98–111)
Creatinine, Ser: <0.3 mg/dL — ABNORMAL LOW (ref 0.61–1.24)
Glucose, Bld: 106 mg/dL — ABNORMAL HIGH (ref 70–99)
Potassium: 4.4 mmol/L (ref 3.5–5.1)
Sodium: 138 mmol/L (ref 135–145)

## 2019-01-05 IMAGING — DX DG CHEST 1V PORT
1 series · 1 of 1 positions shown · non-contrast
Comparison: [DATE].

CLINICAL DATA: Hypoxia, fever.

EXAM:
PORTABLE CHEST 1 VIEW

[chest ap]
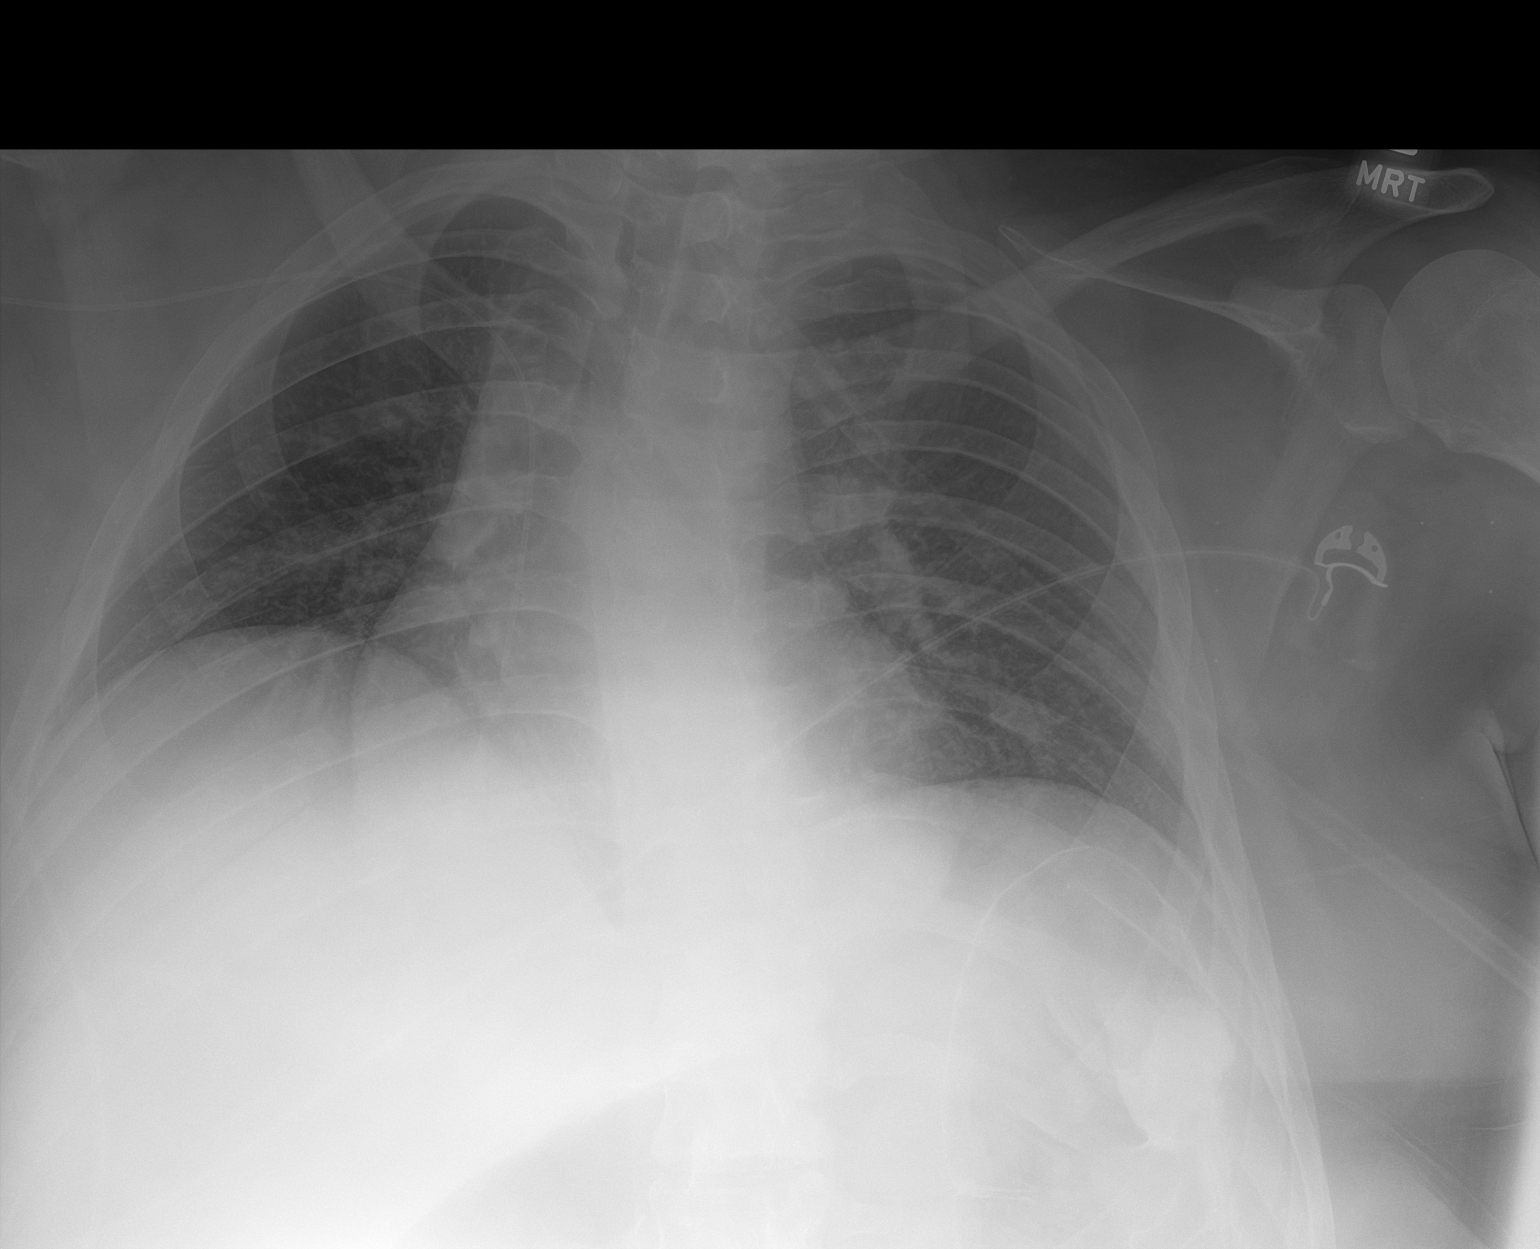

[1 of 1 positions shown; findings below may reference images not displayed]

FINDINGS: The heart size and mediastinal contours are within normal limits.
Both lungs are clear. Right-sided PICC line is noted with tip in
expected position of right atrium. No pneumothorax or pleural
effusion is noted. The visualized skeletal structures are
unremarkable.
IMPRESSION: No active disease.

## 2019-01-05 MED ORDER — SODIUM CHLORIDE 0.9 % IV BOLUS
500.0000 mL | Freq: Once | INTRAVENOUS | Status: AC
  Administered 2019-01-05: 500 mL via INTRAVENOUS

## 2019-01-05 NOTE — Progress Notes (Signed)
CPT held at this time due to patient sleeping. Vitals stable. Will continue to monitor.

## 2019-01-05 NOTE — Progress Notes (Signed)
[facility]  PROGRESS NOTE  Aaron Vega DJM:[id number] DOB: 09-Oct-1995 DOA: 12/21/2018 PCP: [facility] Admit date - 11/26/2018   Admitting Physician Assunta Found, MD  Outpatient Primary MD for the patient is [facility] [place]  LOS - 73 Brief Narrative    23 year old autistic male with autism and known epilepsy followed by [facility] and nonverbal at baseline, with marked cognitive decline and gait dysfunction since head injury after seizure on 08/2018 attributed to hypoxic ischemic encephalopathy following prolonged seizure. Prior to that he had a much better quality of life in terms of independence with ADLs. He presented on 11/29/2018 with severe obstipation, lethargy and vomiting with decreased urinary output.   Admitted with working diagnosis of subacute shock with lactic acidosis from aspiration pneumonia, metabolic encephalopathy and ileus.  Hospital course complicated by  -persistent lactic acidosis -Wernicke's encephalopathy in setting of severe thiamine deficiency, confirmed on MRI 11/16 (given rapid decline in cognition and gait in addition to new onset of peripheral neuropathic symptoms since the onset of intractable N/V) -Adynamic ileus and hepatic steatosis, 11/28 on Ct abd/pelvis -GBS treated with IVIG.   -Intubation for respiratory failure in setting of sepsis and inability to clear secretions (11/27 intubation, extubated 11/30) -Pressor support for hypotension, off on 11/30 -Frequent need for deep suctioning due to inability to clear secretions   Subjective  Mr.  Vega today had no acute events overnight.  low A & P  Acute on chronic hypoxemic respiratory failure, stable. Inability to clear secretions due to poor mentation, multifactorial neurologic weakness.  Currently being treated for aspiration pna. Hx of OSA, intolerant of CPAP.  -Last evaluated by PCCM on 12/7 and very high risk for reintubation,  discussed with mother today she elects for DNI but to continue full scope of treatment -monitor in SDU, requiring frequent deep suctioning and Chest PT -high aspiration risk, NPO  Aspiration pneumonia.  Sepsis physiology resolved.  Blood cultures remain negative.  Afebrile over last 24 hours.  Completed previous course of cefepime, Flagyl (11/14-7/18, Unasyn 11/28-12/2) recent chest x-ray shows no overt opacities -Repeat blood cultures unremarkable so far -Completed 5 day course of Unasyn (restarted on 12/7 -Continue aspiration precautions as above -WBC normal  Hypokalemia, improving.  On daily potassium here but missed dose yesterday while holding use of newly inserted PEG tube -Check mag, continue 40 mEq daily potassium, add additional 40 mEq x 1 -Monitor BMP daily  Leukocytosis and mild fever. Febrile yesterday afternoon and new leukocytosis. Repeat CXR shows no opacities. Unclear source of infection given no diarrhea, no change in abdominal exam and tolerating tube feeds ok. Clinically seems stable -repeat blood cultures if febrile again -monitor CBC  Dysphagia Decreased mentation, declining food, high aspiration risk -s/p IR PEG tube on 12/8 -tolerating TF at goal rate   Ascending weakness and metabolic encephalopathy, secondary to GBS + Wernicke's encephalopathy/B1 deficiency. Completed 5 day IVIG course and high dose thiamine -continue thiamine 100 mg daily and multivitamin  Epilepsy history,stable. EEG neg for epileptic activity.  -Continue keppra and onfi, sz precautions -followed by [place] [facility] -[facility] follow up provided per family request on discharge  Adynamic Ileus. Resolved on Abd xr. GI assisted with care. No long on magnesuim and erythromycin - maintain normal K and mg  Goals of care. Came in as hospice care patient and mother retracted that status.  I continue to have bedside conversations with his mother. She understands he has a very low risk of meaningful  recovery. She is  aware that he is less interactive the last 48 hours and has little buffer to sustain an acute clinical change. We discussed this likely being his new baseline--totally dependent on others with no meaningful interaction -Appreciate Palliative care support -Continue goals of care discussions with mother  Hypothyroidism, stable -Continue Synthroid  Hypertension, stable  -Continue lisinopril, metoprolol  Severe malnutrition in context of acute illness/injury. -Continue tube feeds now at goal rate -Continue supplementing feeds   Family Communication  :  Mom updated at bedside  Code Status :  Partial/Full scope/Do not intubate  Disposition Plan  :  Close monitoring progressive unit for deep suctioning for respiratory status, monitor neuro status. Very guarded prognosis  Consults  : Neurology, GI, PCCM, palliative  Procedures  :   10/16 NG tube placed per interventional radiology 11/15 Midline > 11/20 lumbar puncture 11/27 endotracheal intubation > 11/30 11/27 central line placement, right IJ > 12/1*  DVT Prophylaxis  :  Lovenox   Lab Results  Component Value Date   PLT 451 (H) 01/05/2019    Diet :  Diet Order            Diet NPO time specified  Diet effective now               Inpatient Medications Scheduled Meds:  chlorhexidine gluconate (MEDLINE KIT)  15 mL Mouth Rinse BID   Chlorhexidine Gluconate Cloth  6 each Topical Q0600   cloBAZam  60 mg Per Tube BID   enoxaparin (LOVENOX) injection  40 mg Subcutaneous Q12H   feeding supplement (PRO-STAT SUGAR FREE 64)  60 mL Per Tube BID   levothyroxine  25 mcg Per Tube Q0600   lisinopril  40 mg Per NG tube Daily   mouth rinse  15 mL Mouth Rinse q12n4p   metoprolol tartrate  125 mg Per NG tube BID   multivitamin  15 mL Per Tube Daily   nystatin   Topical Daily   pantoprazole sodium  40 mg Per Tube BID   potassium chloride  40 mEq Per Tube Daily   sodium chloride flush  10-40 mL  Intracatheter Q12H   sodium chloride flush  10-40 mL Intracatheter Q12H   tamsulosin  0.4 mg Oral Daily   thiamine injection  100 mg Intravenous Daily   vitamin B-12  1,000 mcg Per Tube Daily   Continuous Infusions:  feeding supplement (VITAL 1.5 CAL) 1,000 mL (01/05/19 1001)   levETIRAcetam 1,000 mg (01/05/19 1001)   PRN Meds:.acetaminophen (TYLENOL) oral liquid 160 mg/5 mL, fentaNYL (SUBLIMAZE) injection, ibuprofen, ipratropium-albuterol, labetalol, lip balm, LORazepam, metoprolol tartrate, ondansetron (ZOFRAN) IV, sodium chloride flush, sodium chloride flush, white petrolatum  Antibiotics  :   Anti-infectives (From admission, onward)   Start     Dose/Rate Route Frequency Ordered Stop   12/31/18 1100  Ampicillin-Sulbactam (UNASYN) 3 g in sodium chloride 0.9 % 100 mL IVPB     3 g 200 mL/hr over 30 Minutes Intravenous Every 6 hours 12/31/18 1000 01/05/19 0645   12/22/18 0900  ampicillin-sulbactam (UNASYN) 1.5 g in sodium chloride 0.9 % 100 mL IVPB  Status:  Discontinued     1.5 g 200 mL/hr over 30 Minutes Intravenous Every 6 hours 12/22/18 0851 12/22/18 0857   12/22/18 0900  Ampicillin-Sulbactam (UNASYN) 3 g in sodium chloride 0.9 % 100 mL IVPB     3 g 200 mL/hr over 30 Minutes Intravenous Every 6 hours 12/22/18 0857 12/26/18 2105   12/19/18 1400  erythromycin (EES) 400 MG/5ML suspension 500  mg  Status:  Discontinued     500 mg Per Tube Every 8 hours 12/19/18 1132 12/30/18 1013   12/18/18 1500  erythromycin 500 mg in sodium chloride 0.9 % 100 mL IVPB  Status:  Discontinued     500 mg 100 mL/hr over 60 Minutes Intravenous Every 8 hours 12/18/18 1449 12/19/18 1132   12/15/18 1400  erythromycin (EES) 400 MG/5ML suspension 400 mg  Status:  Discontinued     400 mg Per Tube Every 8 hours 12/15/18 1012 12/15/18 1130   12/15/18 1400  erythromycin 500 mg in sodium chloride 0.9 % 100 mL IVPB  Status:  Discontinued     500 mg 100 mL/hr over 60 Minutes Intravenous Every 8 hours 12/15/18  1130 12/18/18 1449   12/09/18 0600  vancomycin (VANCOCIN) 1,250 mg in sodium chloride 0.9 % 250 mL IVPB  Status:  Discontinued     1,250 mg 166.7 mL/hr over 90 Minutes Intravenous Every 12 hours 12/20/2018 1539 12/10/18 1043   12/09/18 0000  ceFEPIme (MAXIPIME) 2 g in sodium chloride 0.9 % 100 mL IVPB  Status:  Discontinued     2 g 200 mL/hr over 30 Minutes Intravenous Every 8 hours 12/01/2018 1539 12/11/18 1801   12/09/18 0000  metroNIDAZOLE (FLAGYL) IVPB 500 mg  Status:  Discontinued     500 mg 100 mL/hr over 60 Minutes Intravenous Every 8 hours 12/20/2018 2146 12/11/18 1801   12/07/2018 1415  ceFEPIme (MAXIPIME) 2 g in sodium chloride 0.9 % 100 mL IVPB     2 g 200 mL/hr over 30 Minutes Intravenous  Once 12/18/2018 1411 11/29/2018 1538   12/02/2018 1415  metroNIDAZOLE (FLAGYL) IVPB 500 mg     500 mg 100 mL/hr over 60 Minutes Intravenous  Once 12/07/2018 1411 12/03/2018 1609   12/05/2018 1415  vancomycin (VANCOCIN) IVPB 1000 mg/200 mL premix  Status:  Discontinued     1,000 mg 200 mL/hr over 60 Minutes Intravenous  Once 11/25/2018 1411 12/23/2018 1414   12/16/2018 1415  vancomycin (VANCOCIN) 2,500 mg in sodium chloride 0.9 % 500 mL IVPB     2,500 mg 250 mL/hr over 120 Minutes Intravenous  Once 12/23/2018 1414 12/09/2018 1710       Objective   Vitals:   01/05/19 1000 01/05/19 1116 01/05/19 1400 01/05/19 1552  BP:  96/61  109/74  Pulse: (!) 111   95  Resp: (!) '24  18 20  '$ Temp:  99.1 F (37.3 C) 99.5 F (37.5 C) 98.4 F (36.9 C)  TempSrc:  Axillary Rectal Oral  SpO2: 99%   100%  Weight:      Height:        SpO2: 100 % O2 Flow Rate (L/min): 1 L/min FiO2 (%): 28 %  Wt Readings from Last 3 Encounters:  01/05/19 (!) 137 kg  11/21/18 (!) 148.8 kg  11/08/18 (!) 148.8 kg     Intake/Output Summary (Last 24 hours) at 01/05/2019 1631 Last data filed at 01/05/2019 1500 Gross per 24 hour  Intake 1024.5 ml  Output 3150 ml  Net -2125.5 ml    Physical Exam:  Obese, young male, eyes closed throughout  exam Not following commands Diffuse rhonchi, without respiratory distress, occasionally tries to cough with poor effort No new F.N deficits,  [place].AT, Mildly Tachycardic, No Gallops,Rubs or new Murmurs,  Abdominal binder in place, diminished bowel sounds  I have personally reviewed the following:   Data Reviewed:  CBC Recent Labs  Lab 01/01/19 0641 01/02/19 0426 01/03/19 0817 01/04/19  7622 01/05/19 0510  WBC 9.8 7.8 9.5 9.8 13.2*  HGB 10.0* 9.5* 9.9* 10.1* 10.3*  HCT 33.3* 31.1* 32.2* 32.6* 33.3*  PLT 460* 411* 413* 409* 451*  MCV 93.8 92.3 91.7 91.1 92.0  MCH 28.2 28.2 28.2 28.2 28.5  MCHC 30.0 30.5 30.7 31.0 30.9  RDW 17.7* 17.3* 17.0* 17.2* 17.3*    Chemistries  Recent Labs  Lab 01/01/19 0641 01/02/19 0426 01/03/19 0817 01/04/19 0640 01/05/19 1425  NA 144 145 144 143 138  K 3.4* 3.4* 2.8* 3.8 4.4  CL 105 104 106 106 101  CO2 '27 28 28 28 29  '$ GLUCOSE 97 103* 106* 103* 106*  BUN '12 14 10 12 14  '$ CREATININE 0.40* 0.40* <0.30* 0.42* <0.30*  CALCIUM 9.4 9.6 9.2 9.2 9.4  MG 2.2  --  1.8  --   --    ------------------------------------------------------------------------------------------------------------------ No results for input(s): CHOL, HDL, LDLCALC, TRIG, CHOLHDL, LDLDIRECT in the last 72 hours.  No results found for: HGBA1C ------------------------------------------------------------------------------------------------------------------ No results for input(s): TSH, T4TOTAL, T3FREE, THYROIDAB in the last 72 hours.  Invalid input(s): FREET3 ------------------------------------------------------------------------------------------------------------------ No results for input(s): VITAMINB12, FOLATE, FERRITIN, TIBC, IRON, RETICCTPCT in the last 72 hours.  Coagulation profile No results for input(s): INR, PROTIME in the last 168 hours.  No results for input(s): DDIMER in the last 72 hours.  Cardiac Enzymes No results for input(s): CKMB, TROPONINI, MYOGLOBIN  in the last 168 hours.  Invalid input(s): CK ------------------------------------------------------------------------------------------------------------------    Component Value Date/Time   BNP 67.3 12/10/2018 0930    Micro Results Recent Results (from the past 240 hour(s))  C difficile quick scan w PCR reflex     Status: None   Collection Time: 12/30/18 11:50 AM   Specimen: STOOL  Result Value Ref Range Status   C Diff antigen NEGATIVE NEGATIVE Final   C Diff toxin NEGATIVE NEGATIVE Final   C Diff interpretation No C. difficile detected.  Final    Comment: Performed at [facility], [place] [address] [place], [place] [address]  Culture, blood (routine x 2)     Status: None (Preliminary result)   Collection Time: 01/02/19  9:03 AM   Specimen: BLOOD LEFT HAND  Result Value Ref Range Status   Specimen Description BLOOD LEFT HAND  Final   Special Requests   Final    BOTTLES DRAWN AEROBIC ONLY Blood Culture results may not be optimal due to an inadequate volume of blood received in culture bottles   Culture   Final    NO GROWTH 3 DAYS Performed at [facility], [place] [address], [place], [place] [address]    Report Status PENDING  Incomplete  Culture, blood (routine x 2)     Status: None (Preliminary result)   Collection Time: 01/02/19  1:29 PM   Specimen: BLOOD  Result Value Ref Range Status   Specimen Description BLOOD LEFT FOOT  Final   Special Requests   Final    BOTTLES DRAWN AEROBIC ONLY Blood Culture adequate volume   Culture   Final    NO GROWTH 3 DAYS Performed at [facility], [place] [address] [place], [place] [address]    Report Status PENDING  Incomplete    Radiology Reports DG Abd 1 View  Result Date: 12/30/2018 CLINICAL DATA:  Reason for exam: Vomiting, aspiration into airway Hx HTN, pre-diabetes, fatty liver, pancreatitis, pneumonia EXAM: ABDOMEN - 1 VIEW COMPARISON:  12/25/2018 FINDINGS: Feeding tube tip in the distal duodenum. Stomach appears  decompressed. Multiple dilated small bowel loops in the mid abdomen, increased since previous. Scattered gas in the nondilated colon. CLINICAL DATA:  Reason for exam: Vomiting, aspiration into airway Hx HTN, pre-diabetes, fatty liver, pancreatitis, pneumonia EXAM: ABDOMEN - 1 VIEW COMPARISON:  12/25/2018 FINDINGS: Feeding tube tip in the distal duodenum. Stomach appears decompressed. Multiple dilated small bowel loops in the mid abdomen, increased since previous. Scattered gas in the nondilated colon. IMPRESSION: 1. Multiple dilated small bowel loops in the mid abdomen, increased since previous, suggesting obstruction or developing ileus. 2. Feeding tube tip in the distal duodenum.  Stomach  decompressed. Electronically Signed   By: Lucrezia Europe M.D.   On: 12/30/2018 09:43   DG Abd 1 View  Result Date: 12/24/2018 CLINICAL DATA:  Nasogastric tube placement. EXAM: ABDOMEN - 1 VIEW COMPARISON:  December 21, 2018. FINDINGS: The bowel gas pattern is normal. Nasogastric tube tip is seen in expected position of third portion of duodenum. No radio-opaque calculi or other significant radiographic abnormality are seen. IMPRESSION: Nasogastric tube tip seen in expected position of third portion of duodenum. No evidence of bowel obstruction or ileus. Electronically Signed   By: Marijo Conception M.D.   On: 12/24/2018 14:39   DG Abd 1 View  Result Date: 12/16/2018 CLINICAL DATA:  Ileus. EXAM: ABDOMEN - 1 VIEW COMPARISON:  None. FINDINGS: There is a dilated loop of small bowel in the mid abdomen measuring approximately 3.6 cm. There is no obvious pneumatosis or free air. The patient's enteric tube is outside the field of view. IMPRESSION: Mildly dilated loop of small bowel in the mid abdomen is nonspecific and could represent an ileus or partial small bowel obstruction the appropriate clinical setting. Electronically Signed   By: Constance Holster M.D.   On: 12/16/2018 15:04   DG Abd 1 View  Result Date:  12/14/2018 CLINICAL DATA:  Nasogastric tube advancement. EXAM: ABDOMEN - 1 VIEW COMPARISON:  One view abdomen 12/12/2018 and 12/10/2018. CT 12/09/2018. FINDINGS: 0936 hours. Two views are submitted. The tip of the endotracheal tube is unchanged, overlying the mid stomach. The stomach and colon are mildly distended with gas. No significant small bowel distention identified. There is no evidence of free intraperitoneal air. Atelectasis is present at both lung bases. IMPRESSION: Stable location of the nasogastric tube. No evidence of bowel obstruction. Electronically Signed   By: Richardean Sale M.D.   On: 12/14/2018 09:53   DG Abd 1 View  Result Date: 12/10/2018 CLINICAL DATA:  23 year old male status post NG tube placement. EXAM: ABDOMEN - 1 VIEW COMPARISON:  Abdominal radiograph dated 12/10/2018. FINDINGS: Partially visualized enteric tube with tip and side-port over the gastric air. The osseous structures and soft tissues appear unremarkable. IMPRESSION: Enteric tube with tip and side-port over the gastric air. Electronically Signed   By: Anner Crete M.D.   On: 12/10/2018 14:41   CT Head Wo Contrast  Result Date: 12/01/2018 CLINICAL DATA:  Altered level of consciousness EXAM: CT HEAD WITHOUT CONTRAST TECHNIQUE: Contiguous axial images were obtained from the base of the skull through the vertex without intravenous contrast. COMPARISON:  11/03/2018 FINDINGS: Brain: The brainstem, cerebellum, cerebral peduncles, thalami, basal ganglia, basilar cisterns, and ventricular system appear within normal limits. No intracranial hemorrhage, mass lesion, or acute CVA. Vascular: Unremarkable Skull: Unremarkable Sinuses/Orbits: Mild chronic right frontal sinusitis. Other: No supplemental non-categorized findings. IMPRESSION: 1. No significant intracranial abnormality is observed. 2. Mild chronic right frontal sinusitis. Electronically Signed   By: Cindra Eves.D.  On: 12/20/2018 19:02   CT THORACIC  SPINE WO CONTRAST  Result Date: 12/14/2018 CLINICAL DATA:  Myelopathy, acute or progressive EXAM: CT THORACIC SPINE WITHOUT CONTRAST TECHNIQUE: Multidetector CT images of the thoracic were obtained using the standard protocol without intravenous contrast. COMPARISON:  None. FINDINGS: Alignment: Normal Vertebrae: Normal vertebra.  No fracture or mass. Paraspinal and other soft tissues: Negative for paraspinous mass or adenopathy. NG tube in place Left lower lobe small infiltrate most likely pneumonia. Fatty liver with hepatomegaly. Disc levels: Disc spaces are well preserved. No significant disc degeneration or spurring. Negative for spinal stenosis. IMPRESSION: Negative CT thoracic spine Left lower lobe infiltrate, probable pneumonia Electronically Signed   By: Franchot Gallo M.D.   On: 12/14/2018 20:48   CT LUMBAR SPINE WO CONTRAST  Result Date: 12/14/2018 CLINICAL DATA:  Myelopathy, acute or progressive. EXAM: CT LUMBAR SPINE WITHOUT CONTRAST TECHNIQUE: Multidetector CT imaging of the lumbar spine was performed without intravenous contrast administration. Multiplanar CT image reconstructions were also generated. COMPARISON:  None. FINDINGS: Segmentation: Normal Alignment: Normal Vertebrae: Negative for fracture or mass.  Normal vertebra. Paraspinal and other soft tissues: Negative for paraspinous mass or adenopathy. No soft tissue edema. Disc levels: Disc spaces maintained. No significant disc degeneration or spinal stenosis. IMPRESSION: Negative CT lumbar spine. Electronically Signed   By: Franchot Gallo M.D.   On: 12/14/2018 20:51   MR BRAIN W WO CONTRAST  Result Date: 12/10/2018 CLINICAL DATA:  Slowly progressive ataxia.  History of seizures. EXAM: MRI HEAD WITHOUT AND WITH CONTRAST TECHNIQUE: Multiplanar, multiecho pulse sequences of the brain and surrounding structures were obtained without and with intravenous contrast. CONTRAST:  55m GADAVIST GADOBUTROL 1 MMOL/ML IV SOLN COMPARISON:  Head CT  12/03/2018 FINDINGS: Brain: There is considerable motion degradation. There is abnormal T2 signal and restricted diffusion in both medial thalami. The most likely explanation is Wernicke encephalopathy. The remainder of the brain appears normal. No evidence of ischemic infarction, mass lesion, hemorrhage, hydrocephalus or extra-axial collection. No abnormal contrast enhancement is demonstrated. Vascular: Major vessels at the base of the brain show flow. Skull and upper cervical spine: Negative Sinuses/Orbits: Clear/normal Other: None IMPRESSION: Abnormal T2 signal and restricted diffusion in both medial thalami. 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 Electronically Signed   By: MNelson ChimesM.D.   On: 12/10/2018 17:16   MR CERVICAL SPINE W WO CONTRAST  Result Date: 12/10/2018 CLINICAL DATA:  Slowly progressive ataxia and seizures. EXAM: MRI CERVICAL SPINE WITHOUT AND WITH CONTRAST TECHNIQUE: Multiplanar and multiecho pulse sequences of the cervical spine, to include the craniocervical junction and cervicothoracic junction, were obtained without and with intravenous contrast. CONTRAST:  128mGADAVIST GADOBUTROL 1 MMOL/ML IV SOLN COMPARISON:  None. FINDINGS: The study suffers from considerable motion degradation. Alignment: Normal Vertebrae: Normal Cord: Normal Posterior Fossa, vertebral arteries, paraspinal tissues: See results of brain MRI. Paraspinal regions negative. Disc levels: Normal IMPRESSION: Motion degraded exam.  No abnormality seen in the cervical region. Electronically Signed   By: MaNelson Chimes.D.   On: 12/10/2018 17:20   MR THORACIC SPINE W WO CONTRAST  Result Date: 12/19/2018 CLINICAL DATA:  Quadriplegia EXAM: MRI THORACIC AND LUMBAR SPINE WITHOUT AND WITH CONTRAST TECHNIQUE: Multiplanar and multiecho pulse sequences of the thoracic and lumbar spine were obtained without and with intravenous contrast. CONTRAST:  1069mADAVIST GADOBUTROL 1 MMOL/ML IV SOLN COMPARISON:  CT thoracic  lumbar 12/14/2018 FINDINGS: MRI THORACIC SPINE FINDINGS Alignment:  Normal. Image quality degraded by moderate to extensive motion. Vertebrae: Negative  for fracture or mass. Normal enhancement of the spine. Cord: Limited cord evaluation due to motion. No cord lesion or cord compression identified. Paraspinal and other soft tissues: Negative Disc levels: Negative for disc protrusion or spinal stenosis. MRI LUMBAR SPINE FINDINGS Segmentation:  Normal Motion degraded study. There is progressive motion on the study which becomes more severe on the axial images. Alignment:  Normal Vertebrae:  Negative for fracture or mass. Conus medullaris and cauda equina: Conus extends to the T12-L1 level. Conus and cauda equina appear normal. Paraspinal and other soft tissues: Negative for paraspinous mass or adenopathy. Normal soft tissue enhancement. Disc levels: No significant abnormality at L3-4 or above Mild disc bulging and mild facet degeneration L4-5 and L5-S1. Negative for disc protrusion or stenosis IMPRESSION: Motion degraded study, worse on the thoracic compared to the lumbar spine. No acute thoracic abnormality. Negative for fracture, infection, or spinal stenosis. No acute lumbar abnormality. Mild disc and facet degeneration L4-5 and L5-S1 without stenosis. Electronically Signed   By: Franchot Gallo M.D.   On: 12/19/2018 12:05   MR Lumbar Spine W Wo Contrast  Result Date: 12/19/2018 CLINICAL DATA:  Quadriplegia EXAM: MRI THORACIC AND LUMBAR SPINE WITHOUT AND WITH CONTRAST TECHNIQUE: Multiplanar and multiecho pulse sequences of the thoracic and lumbar spine were obtained without and with intravenous contrast. CONTRAST:  37m GADAVIST GADOBUTROL 1 MMOL/ML IV SOLN COMPARISON:  CT thoracic lumbar 12/14/2018 FINDINGS: MRI THORACIC SPINE FINDINGS Alignment:  Normal. Image quality degraded by moderate to extensive motion. Vertebrae: Negative for fracture or mass. Normal enhancement of the spine. Cord: Limited cord  evaluation due to motion. No cord lesion or cord compression identified. Paraspinal and other soft tissues: Negative Disc levels: Negative for disc protrusion or spinal stenosis. MRI LUMBAR SPINE FINDINGS Segmentation:  Normal Motion degraded study. There is progressive motion on the study which becomes more severe on the axial images. Alignment:  Normal Vertebrae:  Negative for fracture or mass. Conus medullaris and cauda equina: Conus extends to the T12-L1 level. Conus and cauda equina appear normal. Paraspinal and other soft tissues: Negative for paraspinous mass or adenopathy. Normal soft tissue enhancement. Disc levels: No significant abnormality at L3-4 or above Mild disc bulging and mild facet degeneration L4-5 and L5-S1. Negative for disc protrusion or stenosis IMPRESSION: Motion degraded study, worse on the thoracic compared to the lumbar spine. No acute thoracic abnormality. Negative for fracture, infection, or spinal stenosis. No acute lumbar abnormality. Mild disc and facet degeneration L4-5 and L5-S1 without stenosis. Electronically Signed   By: CFranchot GalloM.D.   On: 12/19/2018 12:05   CT ABDOMEN PELVIS W CONTRAST  Result Date: 12/22/2018 CLINICAL DATA:  Persistent ileus. EXAM: CT ABDOMEN AND PELVIS WITH CONTRAST TECHNIQUE: Multidetector CT imaging of the abdomen and pelvis was performed using the standard protocol following bolus administration of intravenous contrast. CONTRAST:  1073mOMNIPAQUE IOHEXOL 300 MG/ML  SOLN COMPARISON:  11/25/2018 FINDINGS: Lower chest: Normal heart size. Airspace disease in both lower lungs, most confluent in the medial left lower lobe. Hepatobiliary: Hepatic steatosis which is diffuse and prominent.No evidence of biliary obstruction or stone. Pancreas: Unremarkable. Spleen: Unremarkable. Adrenals/Urinary Tract: Negative adrenals. No hydronephrosis or stone. Bladder is decompressed by Foley catheter. Stomach/Bowel: Fluid levels seen throughout small and large  bowel with moderate distention of bowel loops. A rectal tube is in place. Maximal colonic thickening is at the ascending segment-8.4 cm. No bowel wall thickening. New high-density within the appendix attributed to barium retention. There is mild widening of  the midportion appendix that is stable, no periappendiceal inflammation. Enteric tube tip in good position at the proximal stomach. Vascular/Lymphatic: No acute vascular abnormality. No mass or adenopathy. Reproductive:Negative Other: No ascites or pneumoperitoneum. Musculoskeletal: No acute abnormalities. IMPRESSION: 1. Adynamic ileus pattern that has progressed from 11/25/2018. 2. Hepatic steatosis. 3. Pneumonia both lung bases, progressed from comparison. Electronically Signed   By: Monte Fantasia M.D.   On: 12/22/2018 15:42   CT ABDOMEN PELVIS W CONTRAST  Result Date: 12/19/2018 CLINICAL DATA:  Abdominal pain. Shortness of breath. EXAM: CT ABDOMEN AND PELVIS WITH CONTRAST TECHNIQUE: Multidetector CT imaging of the abdomen and pelvis was performed using the standard protocol following bolus administration of intravenous contrast. CONTRAST:  135m OMNIPAQUE IOHEXOL 350 MG/ML SOLN COMPARISON:  11/24/2018 FINDINGS: Lower chest: New airspace opacity in the left lower lobe suspicious for pneumonia. Air fluid level in the distal esophagus probably from reflux. Hepatobiliary: Diffuse hepatic steatosis is suspected. Otherwise unremarkable. Pancreas: Unremarkable Spleen: Unremarkable Adrenals/Urinary Tract: Unremarkable Stomach/Bowel: There are a few mildly dilated loops of small bowel measuring up to 3.5 cm in diameter, with scattered air-fluid levels but without an obvious transition point or site of obstruction. Vascular/Lymphatic: Borderline prominent right gastric node 0.9 cm in short axis on image 24/3, previously 0.8 cm. Patient has an unusually small in caliber abdominal aorta, measuring 0.9 cm in diameter. Common origin of the celiac trunk and SMA. No  obvious periaortic inflammatory findings. Reproductive: Unremarkable Other: No supplemental non-categorized findings. Musculoskeletal: Unremarkable IMPRESSION: 1. New airspace opacity in the left lower lobe suspicious for pneumonia. 2. There are a few mildly dilated loops of small bowel with scattered air-fluid levels but without an obvious transition point or site of obstruction. Ileus is favored over low-grade partial small bowel obstruction. 3. Diffuse hepatic steatosis. 4. Air fluid level in the distal esophagus probably from reflux. 5. Small caliber abdominal aorta, only about 0.9 cm in diameter, without periaortic inflammatory findings. 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 Consider follow up vascular surgical consultation. Electronically Signed   By: WVan ClinesM.D.   On: 11/28/2018 19:15   IR GASTROSTOMY TUBE MOD SED  Result Date: 01/02/2019 INDICATION: Dysphagia. Please place percutaneous gastrostomy tube for enteric nutrition supplementation purposes. EXAM: PULL TROUGH GASTROSTOMY TUBE PLACEMENT COMPARISON:  CT abdomen pelvis-12/22/2018 MEDICATIONS: Patient is currently admitted to the hospital and receiving intravenous antibiotics; Antibiotics were administered within 1 hour of the procedure. Glucagon 1 mg IV CONTRAST:  30 mL of Isovue-300 administered into the gastric lumen. ANESTHESIA/SEDATION: Moderate (conscious) sedation was employed during this procedure. A total of Versed 1 mg and Fentanyl 50 mcg was administered intravenously. Moderate Sedation Time: 10 minutes. The patient's level of consciousness and vital signs were monitored continuously by radiology nursing throughout the procedure under my direct supervision. FLUOROSCOPY TIME:  3 minutes, 30 seconds (89 mGy) COMPLICATIONS: None immediate. PROCEDURE: Informed written consent was obtained from the patient following explanation of the procedure, risks, benefits and alternatives. A time out was performed prior to the  initiation of the procedure. Ultrasound scanning was performed to demarcate the edge of the left lobe of the liver. Maximal barrier sterile technique utilized including caps, mask, sterile gowns, sterile gloves, large sterile drape, hand hygiene and Betadine prep. The left upper quadrant was sterilely prepped and draped. An oral gastric catheter was inserted into the stomach under fluoroscopy. The existing nasogastric feeding tube was removed. The left costal margin and barium/air opacified transverse colon were identified and avoided. Air was injected into  the stomach for insufflation and visualization under fluoroscopy. Under sterile conditions a 17 gauge trocar needle was utilized to access the stomach percutaneously beneath the left subcostal margin after the overlying soft tissues were anesthetized with 1% Lidocaine with epinephrine. Needle position was confirmed within the stomach with aspiration of air and injection of small amount of contrast. A single T tack was deployed for gastropexy. Over an Amplatz guide wire, a 9-French sheath was inserted into the stomach. A snare device was utilized to capture the oral gastric catheter. The snare device was pulled retrograde from the stomach up the esophagus and out the oropharynx. The 20-French pull-through gastrostomy was connected to the snare device and pulled antegrade through the oropharynx down the esophagus into the stomach and then through the percutaneous tract external to the patient. The gastrostomy was assembled externally. Contrast injection confirms position in the stomach. Several spot radiographic images were obtained in various obliquities for documentation. The patient tolerated procedure well without immediate post procedural complication. FINDINGS: After successful fluoroscopic guided placement, the gastrostomy tube is appropriately positioned with internal disc against the ventral aspect of the gastric lumen. IMPRESSION: Successful fluoroscopic  insertion of a 20-French pull-through gastrostomy tube. The gastrostomy may be used immediately for medication administration and in 24 hrs for the initiation of feeds. Electronically Signed   By: Sandi Mariscal M.D.   On: 01/02/2019 10:14   DG CHEST PORT 1 VIEW  Result Date: 01/05/2019 CLINICAL DATA:  Hypoxia, fever. EXAM: PORTABLE CHEST 1 VIEW COMPARISON:  December 31, 2018. FINDINGS: The heart size and mediastinal contours are within normal limits. Both lungs are clear. Right-sided PICC line is noted with tip in expected position of right atrium. No pneumothorax or pleural effusion is noted. The visualized skeletal structures are unremarkable. IMPRESSION: No active disease. Electronically Signed   By: Marijo Conception M.D.   On: 01/05/2019 11:25   DG CHEST PORT 1 VIEW  Result Date: 12/31/2018 CLINICAL DATA:  23 year old male with a history aspiration EXAM: PORTABLE CHEST 1 VIEW COMPARISON:  12/30/2018, 12/28/2018 FINDINGS: Cardiomediastinal silhouette unchanged in size and contour. Low lung volumes persist with linear opacities at the lung bases. No pneumothorax or large pleural effusion. Crowding of the interstitium and the vasculature. Enteric tube projects over the mediastinum and terminates out of the field of view. IMPRESSION: Unchanged appearance of the chest x-ray with low lung volumes and likely atelectasis. Unchanged enteric feeding tube. Electronically Signed   By: Corrie Mckusick D.O.   On: 12/31/2018 07:45   DG CHEST PORT 1 VIEW  Result Date: 12/30/2018 CLINICAL DATA:  Reason for exam: Vomiting, aspiration into airway Hx HTN, pre-diabetes, fatty liver, pancreatitis, pneumonia EXAM: PORTABLE CHEST - 1 VIEW COMPARISON:  12/28/2018 FINDINGS: Feeding tube remains in place. Persistent low lung volumes with coarse perihilar and bibasilar interstitial markings. No new infiltrate or edema. Heart size and mediastinal contours are within normal limits. No effusion. Visualized bones unremarkable.  IMPRESSION: 1. Stable chest x-ray. No new infiltrate or edema. 2. Stable appearance of the cardiomediastinal silhouette. Electronically Signed   By: Lucrezia Europe M.D.   On: 12/30/2018 09:42   DG CHEST PORT 1 VIEW  Result Date: 12/28/2018 CLINICAL DATA:  Updated status.  Fever. EXAM: PORTABLE CHEST 1 VIEW COMPARISON:  December 26, 2018 FINDINGS: The feeding tube terminates below today's film. Mild opacity in left base is somewhat streaky in platelike laterally. Low lung volumes. No pneumothorax. The lungs are otherwise clear. The cardiomediastinal silhouette is stable. IMPRESSION: Streaky opacity  in left base is favored to represent atelectasis given the platelike configuration laterally. Developing infiltrate not completely excluded. Recommend attention on follow-up. 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 EXAM: PORTABLE CHEST 1 VIEW COMPARISON:  Portable chest 12/21/2018. FINDINGS: Portable AP semi upright view at 0052 hours. Extubated and NG type tube removed since 12/21/2018. Lower lung volumes. Mediastinal contours remain normal. There is an enteric feeding tube in place which courses to the abdomen and probably crosses midline to the right but the tip is not included. Increased crowding of lung markings and/or pulmonary vascularity without overt edema. No pneumothorax, pleural effusion or consolidation. Stable visible bowel gas pattern. IMPRESSION: 1. Extubated with lower lung volumes. No other acute cardiopulmonary abnormality. 2. Enteric feeding tube courses to the abdomen, tip not included. Electronically Signed   By: Genevie Ann M.D.   On: 12/26/2018 01:08   DG CHEST PORT 1 VIEW  Result Date: 12/21/2018 CLINICAL DATA:  23 year old male with history of central line and nasogastric tube placement. EXAM: PORTABLE CHEST 1 VIEW COMPARISON:   Chest x-ray 12/21/2018. FINDINGS: An endotracheal tube is in place with tip 4.1 cm above the carina. There is a right-sided internal jugular central venous catheter with tip terminating in the distal superior vena cava. Nasogastric tube extending into the proximal stomach with side port just distal to the gastroesophageal junction. No pneumothorax. Lung volumes are low. Ill-defined opacities in the medial aspect of the left lung base and medial aspect of the right upper lobe. No pleural effusions. No evidence of pulmonary edema. Heart size is normal. IMPRESSION: 1. Support apparatus, as above. 2. Ill-defined opacities in the medial left lower lobe and medial right upper lobe concerning for multilobar pneumonia. Electronically Signed   By: Vinnie Langton M.D.   On: 12/21/2018 17:22   DG CHEST PORT 1 VIEW  Result Date: 12/21/2018 CLINICAL DATA:  Aspiration, possible seizure EXAM: PORTABLE CHEST 1 VIEW COMPARISON:  12/20/2018 FINDINGS: No significant change in low volume AP portable examination without significant acute appearing airspace opacity. Esophagogastric tube remains with tip and side port below the diaphragm. Heart and mediastinum are unremarkable. IMPRESSION: No significant change in low volume portable chest x-ray. No new findings. Electronically Signed   By: Eddie Candle M.D.   On: 12/21/2018 11:32   DG CHEST PORT 1 VIEW  Result Date: 12/20/2018 CLINICAL DATA:  Fever. History of hypertension seizures and pancreatitis. EXAM: PORTABLE CHEST 1 VIEW COMPARISON:  12/16/2018 and older exams. FINDINGS: Lung volumes are low. Atelectasis in the medial lung bases. Lungs otherwise clear. No pleural effusion or pneumothorax. Cardiac silhouette normal in size.  No mediastinal or hilar masses. Nasogastric tube passes below the diaphragm well into the stomach. IMPRESSION: 1. No acute cardiopulmonary disease. Electronically Signed   By: Lajean Manes M.D.   On: 12/20/2018 13:39   DG CHEST PORT 1  VIEW  Result Date: 12/16/2018 CLINICAL DATA:  Shortness of breath. EXAM: PORTABLE CHEST 1 VIEW COMPARISON:  December 15, 2018. FINDINGS: Again noted are low lung volumes with bibasilar atelectasis. The cardiac silhouette remains enlarged. There is no pneumothorax. No large pleural effusion. No acute osseous abnormality. An enteric tube is noted. IMPRESSION: Stable appearance of the chest. Electronically Signed   By: Constance Holster M.D.   On: 12/16/2018 15:02   DG Chest Port 1  View  Result Date: 12/15/2018 CLINICAL DATA:  Respiratory failure. EXAM: PORTABLE CHEST 1 VIEW COMPARISON:  12/14/2018 FINDINGS: 0514 hours. Low lung volumes. Interval improvement in left basilar aeration. The lungs are clear without focal pneumonia, edema, pneumothorax or pleural effusion. Cardiopericardial silhouette accentuated by low volume AP technique. NG tube tip is in the mid stomach. Telemetry leads overlie the chest. IMPRESSION: Low volume film without acute cardiopulmonary findings. Electronically Signed   By: Misty Stanley M.D.   On: 12/15/2018 07:37   DG CHEST PORT 1 VIEW  Result Date: 12/14/2018 CLINICAL DATA:  Shortness of breath. EXAM: PORTABLE CHEST 1 VIEW COMPARISON:  Chest radiograph 12/10/2018, abdominal radiograph 12/12/2018 FINDINGS: Enteric tube is been retracted, tip now in the distal esophagus, side-port in the mid esophagus. Low lung volumes similar to prior exam. Slight increasing patchy opacities at the left lung base. Unchanged heart size and mediastinal contours. No pleural fluid or pneumothorax. IMPRESSION: 1. Enteric tube has been retracted, tip now in the distal esophagus, side-port in the mid esophagus. Recommend advancement of least 10 cm place the side-port below the diaphragm. 2. Persistent low lung volumes. Increasing patchy opacities at the left lung base, may be atelectasis or pneumonia. Electronically Signed   By: Keith Rake M.D.   On: 12/14/2018 05:22   DG CHEST PORT 1  VIEW  Result Date: 12/10/2018 CLINICAL DATA:  Dyspnea. EXAM: PORTABLE CHEST 1 VIEW COMPARISON:  December 09, 2018. FINDINGS: The heart size and mediastinal contours are within normal limits. No pneumothorax or pleural effusion is noted. Hypoinflation of the lungs is noted with minimal bibasilar subsegmental atelectasis. The visualized skeletal structures are unremarkable. IMPRESSION: Hypoinflation of the lungs with minimal bibasilar subsegmental atelectasis. Electronically Signed   By: Marijo Conception M.D.   On: 12/10/2018 09:17   DG CHEST PORT 1 VIEW  Result Date: 12/09/2018 CLINICAL DATA:  Tachypnea, low-grade fever EXAM: PORTABLE CHEST 1 VIEW COMPARISON:  12/07/2018 FINDINGS: Low lung volumes. Heart size is accentuated by the low volumes. Bibasilar atelectasis. No effusions. No acute bony abnormality. IMPRESSION: Low lung volumes, bibasilar atelectasis. Electronically Signed   By: Rolm Baptise M.D.   On: 12/09/2018 21:38   DG Chest Port 1 View  Result Date: 12/10/2018 CLINICAL DATA:  Sepsis. EXAM: PORTABLE CHEST 1 VIEW COMPARISON:  11/21/2018 FINDINGS: A very poor inspiration is again demonstrated. The heart remains grossly normal in size. Interval minimal patchy and linear density in the left lower lobe. Clear right lung. Mild peribronchial thickening. Unremarkable bones. IMPRESSION: 1. Interval minimal patchy and linear atelectasis or pneumonia in the left lower lobe. 2. Mild bronchitic changes. 3. Stable very poor inspiration. Electronically Signed   By: Claudie Revering M.D.   On: 12/11/2018 15:47   DG Abd Portable 1V  Result Date: 01/02/2019 CLINICAL DATA:  Severe sepsis. EXAM: PORTABLE ABDOMEN - 1 VIEW COMPARISON:  12/30/2018 FINDINGS: Air is present throughout the colon. Contrast is present over the right colon, terminal ileum and appendix. Small caliber probe projects over the rectum. Enteric tube is present with tip in the midline of the mid abdomen likely over the distal duodenum near the  duodenal jejunal junction. Remainder the exam is unchanged IMPRESSION: 1.  Nonobstructive bowel gas pattern as described. 2. Enteric tube with tip unchanged just left of midline in the upper abdomen likely over the third/fourth portion of the duodenum. Electronically Signed   By: Marin Olp M.D.   On: 01/02/2019 08:03   DG Abd Portable 1V  Result Date: 12/30/2018  CLINICAL DATA:  Ileus EXAM: PORTABLE ABDOMEN - 1 VIEW COMPARISON:  Portable exam 1715 hours compared to 0906 hours FINDINGS: Tip of feeding tube projects over the third portion of duodenum approaching ligament of Treitz. Scattered gas throughout bowel, decreased from prior exam. No bowel dilatation or bowel wall thickening. Osseous structures unremarkable. No urinary tract calcifications. IMPRESSION: Nonspecific bowel gas pattern. Decreased gaseous distention of bowel since prior study. Electronically Signed   By: Lavonia Dana M.D.   On: 12/30/2018 19:20   DG Abd Portable 1V  Result Date: 12/25/2018 CLINICAL DATA:  Feeding tube placement. EXAM: PORTABLE ABDOMEN - 1 VIEW COMPARISON:  December 24, 2018. FINDINGS: The bowel gas pattern is normal. Distal tip of feeding tube is seen in expected position of proximal duodenum. No radio-opaque calculi or other significant radiographic abnormality are seen. IMPRESSION: Negative. Electronically Signed   By: Marijo Conception M.D.   On: 12/25/2018 16:06   DG Abd Portable 1V  Result Date: 12/21/2018 CLINICAL DATA:  Nasogastric tube placement. EXAM: PORTABLE ABDOMEN - 1 VIEW COMPARISON:  December 20, 2018. FINDINGS: Mildly dilated air-filled large and small bowel loops are noted which may represent ileus. Distal tip of nasogastric tube is seen in proximal stomach. IMPRESSION: Distal tip of nasogastric tube seen in proximal stomach. Mildly dilated air-filled large and small bowel loops are noted which may represent ileus. Electronically Signed   By: Marijo Conception M.D.   On: 12/21/2018 17:22   DG Abd  Portable 1V  Result Date: 12/20/2018 CLINICAL DATA:  NG placement EXAM: PORTABLE ABDOMEN - 1 VIEW COMPARISON:  Radiograph 12/20/2018 FINDINGS: Transesophageal tube tip and side port distal to the GE junction, curling in the left upper quadrant in the region of the gastric body. Atelectatic changes in the otherwise clear lung bases. Gaseous distention of what appears to be the colon without high-grade bowel obstruction. IMPRESSION: 1. Transesophageal tube tip and side port in the region of the gastric body. 2. Gaseous distention of the colon without high-grade bowel obstruction. Electronically Signed   By: Lovena Le M.D.   On: 12/20/2018 02:45   DG Abd Portable 1V  Result Date: 12/20/2018 CLINICAL DATA:  Check gastric catheter placement EXAM: PORTABLE ABDOMEN - 1 VIEW COMPARISON:  12/20/2018 FINDINGS: Gastric catheter has been advanced with the tip in the stomach. Proximal side port lies at the gastroesophageal junction. This could be advanced several cm. The lungs are clear. IMPRESSION: Gastric catheter as described. The proximal side port is not within the stomach and should be advanced several cm. Electronically Signed   By: Inez Catalina M.D.   On: 12/20/2018 01:50   DG Abd Portable 1V  Result Date: 12/20/2018 CLINICAL DATA:  NG tube placement EXAM: PORTABLE ABDOMEN - 1 VIEW COMPARISON:  12/16/2018 abdominal radiograph FINDINGS: The side port of the NG tube is in the distal esophagus. Recommend advancing by 7-10 cm. Lungs are clear. Nonobstructive bowel gas pattern. IMPRESSION: 1. Side-port of the nasogastric tube in the distal esophagus. Recommend advancing by 7-10 cm. 2. No active cardiopulmonary disease. Electronically Signed   By: Ulyses Jarred M.D.   On: 12/20/2018 00:41   DG Abd Portable 1V  Result Date: 12/12/2018 CLINICAL DATA:  NG tube placement. EXAM: PORTABLE ABDOMEN - 1 VIEW COMPARISON:  Radiograph dated 12/10/2018 FINDINGS: NG tube has been inserted and the tip is in the midbody  of the stomach. The visualized bowel gas pattern is normal. No visible acute bone abnormality. IMPRESSION: NG tube in the body  of the stomach. Electronically Signed   By: Lorriane Shire M.D.   On: 12/12/2018 21:18   DG Abd Portable 1V  Result Date: 12/10/2018 CLINICAL DATA:  Dyspnea, abdominal pain and distention EXAM: PORTABLE ABDOMEN - 1 VIEW COMPARISON:  12/18/2018 abdominal radiograph FINDINGS: There are mildly dilated small bowel loops throughout the abdomen measuring up to 3.9 cm diameter in the left abdomen, mildly decreased in caliber. No evidence of pneumatosis or pneumoperitoneum. No radiopaque nephrolithiasis. IMPRESSION: Diffuse mild small bowel dilatation, mildly decreased, which could represent either improving adynamic ileus or improving partial distal small bowel obstruction. Electronically Signed   By: Ilona Sorrel M.D.   On: 12/10/2018 09:19   DG Abd Portable 1V  Result Date: 12/09/2018 CLINICAL DATA:  Nasogastric tube placement. EXAM: PORTABLE ABDOMEN - 1 VIEW COMPARISON:  12/09/2018 FINDINGS: No nasogastric tube seen in the lower chest or upper abdomen. Multiple dilated small bowel loops with mild improvement. Gas is again demonstrated in normal caliber colon. Mild scoliosis. IMPRESSION: 1. No nasogastric tube seen in the lower chest or upper abdomen. 2. Mildly improved small bowel obstruction. Electronically Signed   By: Claudie Revering M.D.   On: 12/09/2018 11:40   DG Abd Portable 1V  Result Date: 12/20/2018 CLINICAL DATA:  Sepsis. Fever. EXAM: PORTABLE ABDOMEN - 1 VIEW COMPARISON:  11/23/2018 and abdomen and pelvis CT dated 11/24/2018. FINDINGS: Interval multiple mildly dilated loops of proximal small bowel with gas-filled normal caliber distal small bowel and colon. Unremarkable bones. IMPRESSION: Interval mild small bowel ileus or partial obstruction. No gross free air seen at this time. Electronically Signed   By: Claudie Revering M.D.   On: 12/05/2018 15:49   EEG adult  Result  Date: 12/21/2018 Lora Havens, MD     12/21/2018 12:59 PM Patient Name:Pate Verl Dicker GQQ:[id number] Epilepsy Attending:Priyanka Barbra Sarks Referring Physician/Provider:Dr Kathrynn Speed Date: 12/21/2018 Duration: 23.02 minutes  Patient history:23 year old male with known epilepsy who was noted to have episode of extension of arm with eyes rolling upwards concerning for seizure.. EEG to evaluate for seizures.   Level of alertness:Awake  AEDs during EEG study:Keppra, Onfi  Technical aspects: This EEG study was done with scalp electrodes positioned according to the 10-20 International system of electrode placement. Electrical activity was acquired at a sampling rate of '500Hz'$  and reviewed with a high frequency filter of '70Hz'$  and a low frequency filter of '1Hz'$ . EEG data were recorded continuously and digitally stored.  Description:During awake state, no clear posterior dominant rhythm was seen. EEG showed continuous generalized 5 to 6 Hz theta slowing admixed withan excessive amount of 15 to 18 Hz, 2-3 uV beta activity with irregular morphology distributed symmetrically and diffusely. Hyperventilation and photic stimulation were not performed.  Abnormality -Continuous slow, generalized -Excessive beta, generalized    IMPRESSION: This study is suggestive of mild to moderate diffuse encephalopathy, nonspecific to etiology. No seizures or epileptiform discharges were seen throughout the recording.  The excessive beta activity seen in the background is most likely due to the effect of benzodiazepine and is a benign EEG pattern.  Priyanka Barbra Sarks  Overnight EEG with video  Result Date: 12/22/2018 Lora Havens, MD     12/23/2018  9:31 AM Patient Name:Tracey Verl Dicker IZT:[id number] Epilepsy Attending:Priyanka Barbra Sarks Referring Physician/Provider:Dr Kathrynn Speed Duration: 12/21/2018 1231 to 12/22/2018 1231  Patient history:23 year old male with known epilepsy who was  noted to have episode of extension of arm with eyes rolling upwards concerning for seizure. EEG to evaluate for seizures.  Level of alertness:Awake, asleep  AEDs during EEG study:Keppra, Onfi  Technical aspects: This EEG study was done with scalp electrodes positioned according to the 10-20 International system of electrode placement. Electrical activity was acquired at a sampling rate of '500Hz'$  and reviewed with a high frequency filter of '70Hz'$  and a low frequency filter of '1Hz'$ . EEG data were recorded continuously and digitally stored.  Description:During awake state, no clear posterior dominant rhythm was seen. Sleep was characterized by vertex waves, sleep spindles (12-'14Hz'$ ), maximal frontocentral.EEG showed continuous generalized 5 to 6 Hz theta slowing admixed withan excessive amount of 15 to 18 Hz, 2-3 uV beta activity with irregular morphology distributed symmetrically and diffusely. Hyperventilation and photic stimulation were not performed.  Abnormality -Continuous slow, generalized -Excessive beta, generalized   IMPRESSION: This study is suggestive of mild to moderate diffuse encephalopathy, nonspecific to etiology. No seizures or epileptiform discharges were seen throughout the recording.  The excessive beta activity seen in the background is most likely due to the effect of benzodiazepine and is a benign EEG pattern. Priyanka Barbra Sarks   Overnight EEG with video  Result Date: 12/11/2018 Lora Havens, MD     12/22/2018  8:54 AM Patient Name: Nelson Noone MRN: [id number] Epilepsy Attending: Lora Havens Referring Physician/Provider: Dr [name] Duration: 12/10/2018 1946 to 12/11/2018 1218  Patient history: 23 year old male with known epilepsy presented with altered mental status.  EEG to evaluate for seizures.   Level of alertness: Awake, asleep  AEDs during EEG study: Keppra, Onfi  Technical aspects: This EEG study was done with scalp electrodes positioned according to  the 10-20 International system of electrode placement. Electrical activity was acquired at a sampling rate of '500Hz'$  and reviewed with a high frequency filter of '70Hz'$  and a low frequency filter of '1Hz'$ . EEG data were recorded continuously and digitally stored.  Description: During awake state, no clear posterior dominant rhythm was seen.  Sleep was characterized by vertex waves, sleep spindles (12 to 14 Hz), maximal frontocentral.  EEG showed continuous generalized 5 to 6 Hz theta slowing admixed with an excessive amount of 15 to 18 Hz, 2-3 uV beta activity with irregular morphology distributed symmetrically and diffusely.  Hyperventilation and photic stimulation were not performed.  Abnormality -Continuous slow, generalized -Excessive beta, generalized     IMPRESSION: This study is suggestive of mild diffuse encephalopathy, nonspecific to etiology. No seizures or epileptiform discharges were seen throughout the recording.  The excessive beta activity seen in the background is most likely due to the effect of benzodiazepine and is a benign EEG pattern.  Lora Havens   ECHOCARDIOGRAM COMPLETE  Result Date: 12/12/2018   ECHOCARDIOGRAM REPORT   Patient Name:   ISSACC MERLO Date of Exam: 12/12/2018 Medical Rec #:  [id number]      Height:       71.0 in Accession #:    [id number]     Weight:       298.0 lb Date of Birth:  [birth year]       BSA:          2.50 m Patient Age:    23 years       BP:           110/86 mmHg Patient Gender: M              HR:           124 bpm. Exam Location:  Inpatient Procedure: 2D Echo and Intracardiac Opacification Agent Indications:  Abnormal ECG 794.31/R94.31  History:        Patient has no prior history of Echocardiogram examinations.  Sonographer:    Clayton Lefort RDCS (AE) Referring Phys: [id number] [name]  Sonographer Comments: Technically difficult study due to poor echo windows, suboptimal apical window, suboptimal parasternal window and patient is morbidly obese. Image  acquisition challenging due to patient body habitus. IMPRESSIONS  1. Left ventricular ejection fraction, by visual estimation, is 55 to 60%. The left ventricle has normal function. There is mildly increased left ventricular hypertrophy.  2. Definity contrast agent was given IV to delineate the left ventricular endocardial borders.  3. The left ventricle has no regional wall motion abnormalities.  4. Indeterminate diastolic filling due to E-A fusion.  5. Global right ventricle has normal systolic function.The right ventricular size is normal. No increase in right ventricular wall thickness.  6. Left atrial size was normal.  7. Right atrial size was normal.  8. The mitral valve is normal in structure. No evidence of mitral valve regurgitation. No evidence of mitral stenosis.  9. The tricuspid valve is normal in structure. Tricuspid valve regurgitation is not demonstrated. 10. The aortic valve is tricuspid. Aortic valve regurgitation is not visualized. No evidence of aortic valve sclerosis or stenosis. 11. TR signal is inadequate for assessing pulmonary artery systolic pressure. 12. Technically difficult study with poor acoustic windows. FINDINGS  Left Ventricle: Left ventricular ejection fraction, by visual estimation, is 55 to 60%. The left ventricle has normal function. Definity contrast agent was given IV to delineate the left ventricular endocardial borders. The left ventricle has no regional wall motion abnormalities. The left ventricular internal cavity size was the left ventricle is normal in size. There is mildly increased left ventricular hypertrophy. Indeterminate diastolic filling due to E-A fusion. Right Ventricle: The right ventricular size is normal. No increase in right ventricular wall thickness. Global RV systolic function is has normal systolic function. Left Atrium: Left atrial size was normal in size. Right Atrium: Right atrial size was normal in size Pericardium: There is no evidence of  pericardial effusion. Mitral Valve: The mitral valve is normal in structure. No evidence of mitral valve stenosis by observation. No evidence of mitral valve regurgitation. Tricuspid Valve: The tricuspid valve is normal in structure. Tricuspid valve regurgitation is not demonstrated. Aortic Valve: The aortic valve is tricuspid. Aortic valve regurgitation is not visualized. The aortic valve is structurally normal, with no evidence of sclerosis or stenosis. Aortic valve mean gradient measures 5.0 mmHg. Aortic valve peak gradient measures 8.3 mmHg. Aortic valve area, by VTI measures 2.58 cm. Pulmonic Valve: The pulmonic valve was normal in structure. Pulmonic valve regurgitation is not visualized. Aorta: The aortic root is normal in size and structure. Venous: The inferior vena cava was not well visualized. IAS/Shunts: No atrial level shunt detected by color flow Doppler.  LEFT VENTRICLE PLAX 2D LVIDd:         4.14 cm LVIDs:         3.35 cm LV PW:         1.38 cm LV IVS:        1.32 cm LVOT diam:     2.10 cm LV SV:         30 ml LV SV Index:   11.34 LVOT Area:     3.46 cm  RIGHT VENTRICLE RV Basal diam:  3.34 cm RV S prime:     23.10 cm/s TAPSE (M-mode): 2.8 cm LEFT ATRIUM  Index       RIGHT ATRIUM           Index LA diam:        4.00 cm 1.60 cm/m  RA Area:     18.00 cm LA Vol (A2C):   50.6 ml 20.26 ml/m RA Volume:   49.50 ml  19.82 ml/m LA Vol (A4C):   56.1 ml 22.46 ml/m LA Biplane Vol: 55.1 ml 22.06 ml/m  AORTIC VALVE AV Area (Vmax):    2.69 cm AV Area (Vmean):   2.33 cm AV Area (VTI):     2.58 cm AV Vmax:           144.00 cm/s AV Vmean:          104.000 cm/s AV VTI:            0.234 m AV Peak Grad:      8.3 mmHg AV Mean Grad:      5.0 mmHg LVOT Vmax:         112.00 cm/s LVOT Vmean:        70.000 cm/s LVOT VTI:          0.174 m LVOT/AV VTI ratio: 0.74  AORTA Ao Root diam: 2.20 cm  SHUNTS Systemic VTI:  0.17 m Systemic Diam: 2.10 cm  Loralie Champagne MD Electronically signed by Loralie Champagne MD  Signature Date/Time: 12/12/2018/5:14:36 PM    Final    VAS [place] LOWER EXTREMITY VENOUS (DVT)  Result Date: 12/22/2018  Lower Venous Study Indications: Edema.  Risk Factors: None identified. Limitations: Body habitus and poor ultrasound/tissue interface. Comparison Study: No prior studies. Performing Technologist: Oliver Hum RVT  Examination Guidelines: A complete evaluation includes B-mode imaging, spectral Doppler, color Doppler, and power Doppler as needed of all accessible portions of each vessel. Bilateral testing is considered an integral part of a complete examination. Limited examinations for reoccurring indications may be performed as noted.  +---------+---------------+---------+-----------+----------+--------------+  RIGHT     Compressibility Phasicity Spontaneity Properties Thrombus Aging  +---------+---------------+---------+-----------+----------+--------------+  CFV       Full            Yes       Yes                                    +---------+---------------+---------+-----------+----------+--------------+  SFJ       Full                                                             +---------+---------------+---------+-----------+----------+--------------+  FV Prox   Full                                                             +---------+---------------+---------+-----------+----------+--------------+  FV Mid    Full                                                             +---------+---------------+---------+-----------+----------+--------------+  FV Distal Full                                                             +---------+---------------+---------+-----------+----------+--------------+  PFV       Full                                                             +---------+---------------+---------+-----------+----------+--------------+  POP       Full            Yes       Yes                                     +---------+---------------+---------+-----------+----------+--------------+  PTV       Full                                                             +---------+---------------+---------+-----------+----------+--------------+  PERO      Full                                                             +---------+---------------+---------+-----------+----------+--------------+   +---------+---------------+---------+-----------+----------+--------------+  LEFT      Compressibility Phasicity Spontaneity Properties Thrombus Aging  +---------+---------------+---------+-----------+----------+--------------+  CFV       Full            Yes       Yes                                    +---------+---------------+---------+-----------+----------+--------------+  SFJ       Full                                                             +---------+---------------+---------+-----------+----------+--------------+  FV Prox   Full                                                             +---------+---------------+---------+-----------+----------+--------------+  FV Mid    Full                                                             +---------+---------------+---------+-----------+----------+--------------+  FV Distal Full                                                             +---------+---------------+---------+-----------+----------+--------------+  PFV       Full                                                             +---------+---------------+---------+-----------+----------+--------------+  POP       Full            Yes       Yes                                    +---------+---------------+---------+-----------+----------+--------------+  PTV       Full                                                             +---------+---------------+---------+-----------+----------+--------------+  PERO      Full                                                              +---------+---------------+---------+-----------+----------+--------------+     Summary: Right: There is no evidence of deep vein thrombosis in the lower extremity. No cystic structure found in the popliteal fossa. Left: There is no evidence of deep vein thrombosis in the lower extremity. No cystic structure found in the popliteal fossa.  *See table(s) above for measurements and observations. Electronically signed by Curt Jews MD on 12/22/2018 at 9:30:31 AM.    Final    [place] EKG SITE RITE  Result Date: 12/31/2018 If Site Rite image not attached, placement could not be confirmed due to current cardiac rhythm.  DG FL GUIDED LUMBAR PUNCTURE  Result Date: 12/14/2018 CLINICAL DATA:  Lower extremity weakness, decreased PO intake, dehydration, concern for Guillain-Barre syndrome EXAM: DIAGNOSTIC LUMBAR PUNCTURE UNDER FLUOROSCOPIC GUIDANCE FLUOROSCOPY TIME:  Fluoroscopy Time: 72 seconds Radiation Exposure Index (if provided by the fluoroscopic device): 395.4 mGy Number of Acquired Spot Images: 3 PROCEDURE: Informed consent was obtained from the patient prior to the procedure, including potential complications of headache, allergy, and pain. With the patient prone, the lower back was prepped with Betadine. 1% Lidocaine was used for local anesthesia. Lumbar puncture was performed at the L5-S1 level using a 20 gauge needle with return of initially blood tinged, subsequently clear CSF. Extremely low opening pressure, which was not measured. 10.5 ml of CSF were obtained for laboratory studies. The patient tolerated the procedure well and there were no apparent complications. During the procedure, the patient's NG tube started to withdraw. This was returned to the original position in the gastric cardia  and advanced slightly into the mid body of the stomach. IMPRESSION: Successful fluoroscopic guided lumbar puncture at L5-S1. Patient's NG tube readjusted, as described above. Electronically Signed   By: Julian Hy  M.D.   On: 12/14/2018 17:19     Time Spent in minutes  40     Desiree Hane M.D on 01/05/2019 at 4:31 PM  To page go to [email or web address] - password [facility]

## 2019-01-05 NOTE — Progress Notes (Signed)
   Vital Signs MEWS/VS Documentation      01/05/2019 2135 01/05/2019 2148 01/05/2019 2200 01/05/2019 2240   MEWS Score:  5  4  3  4    MEWS Score Color:  Red  Red  Yellow  Red   Resp:  (!) 21  (!) 22  17  (!) 22   Pulse:  (!) 105  (!) 108  (!) 103  --   BP:  (!) 92/54  (!) 99/55  --  (!) 101/47   Temp:  98.9 F (37.2 C)  98.9 F (37.2 C)  --  98.8 F (37.1 C)   O2 Device:  --  --  --  Nasal Cannula   O2 Flow Rate (L/min):  --  --  --  1 L/min   Level of Consciousness:  --  Responds to Voice  --  --     Mews noted to be red due to low blood pressure. Patient without noted response. RN notified charge nurse and charge nurse got immediate response after tactile stimuli. Pt opened eyes. Patients mom remains at bedside. Patient then open eyes to this nurse voice about 5 minutes later. MD on call notified of low blood pressure and MD ordered 500cc bolus with effectiveness noted.        Daleen Squibb Alahni Varone 01/05/2019,10:55 PM

## 2019-01-06 DIAGNOSIS — E861 Hypovolemia: Secondary | ICD-10-CM

## 2019-01-06 DIAGNOSIS — R000 Tachycardia, unspecified: Secondary | ICD-10-CM

## 2019-01-06 DIAGNOSIS — I959 Hypotension, unspecified: Secondary | ICD-10-CM

## 2019-01-06 DIAGNOSIS — I9589 Other hypotension: Secondary | ICD-10-CM

## 2019-01-06 LAB — CBC
HCT: 32.3 % — ABNORMAL LOW (ref 39.0–52.0)
Hemoglobin: 9.9 g/dL — ABNORMAL LOW (ref 13.0–17.0)
MCH: 28.2 pg (ref 26.0–34.0)
MCHC: 30.7 g/dL (ref 30.0–36.0)
MCV: 92 fL (ref 80.0–100.0)
Platelets: 459 10*3/uL — ABNORMAL HIGH (ref 150–400)
RBC: 3.51 MIL/uL — ABNORMAL LOW (ref 4.22–5.81)
RDW: 17.5 % — ABNORMAL HIGH (ref 11.5–15.5)
WBC: 12.7 10*3/uL — ABNORMAL HIGH (ref 4.0–10.5)
nRBC: 0 % (ref 0.0–0.2)

## 2019-01-06 LAB — BASIC METABOLIC PANEL
Anion gap: 13 (ref 5–15)
BUN: 13 mg/dL (ref 6–20)
CO2: 26 mmol/L (ref 22–32)
Calcium: 9.2 mg/dL (ref 8.9–10.3)
Chloride: 100 mmol/L (ref 98–111)
Creatinine, Ser: <0.3 mg/dL — ABNORMAL LOW (ref 0.61–1.24)
Glucose, Bld: 111 mg/dL — ABNORMAL HIGH (ref 70–99)
Potassium: 4.4 mmol/L (ref 3.5–5.1)
Sodium: 139 mmol/L (ref 135–145)

## 2019-01-06 LAB — GLUCOSE, CAPILLARY
Glucose-Capillary: 101 mg/dL — ABNORMAL HIGH (ref 70–99)
Glucose-Capillary: 110 mg/dL — ABNORMAL HIGH (ref 70–99)
Glucose-Capillary: 112 mg/dL — ABNORMAL HIGH (ref 70–99)

## 2019-01-06 LAB — CORTISOL: Cortisol, Plasma: 11 ug/dL

## 2019-01-06 LAB — TSH: TSH: 6.251 u[IU]/mL — ABNORMAL HIGH (ref 0.350–4.500)

## 2019-01-06 MED ORDER — SODIUM CHLORIDE 0.9 % IV BOLUS
500.0000 mL | Freq: Once | INTRAVENOUS | Status: AC
  Administered 2019-01-06: 500 mL via INTRAVENOUS

## 2019-01-06 MED ORDER — METOPROLOL TARTRATE 5 MG/5ML IV SOLN
2.5000 mg | Freq: Once | INTRAVENOUS | Status: AC
  Administered 2019-01-06: 03:00:00 2.5 mg via INTRAVENOUS
  Filled 2019-01-06: qty 5

## 2019-01-06 NOTE — Progress Notes (Addendum)
Paged [facility] to make them aware of patient's HR lingering in the 120s, 130s now, bp 105/51 at this time. Patient is alert and tracks nurse around room. Afebrile at 98.68F rectal temp  0251- new order noted.  0304- Metoprolol 2.5mg  given IV. HR 120s, effective, HR now 110s. Will continue to observe.   - Patient HR noted to be elevating back to 130s sustaining. B/p 106/61  0430- Metoprolol given per MD order for HR >130.

## 2019-01-06 NOTE — Progress Notes (Signed)
[facility]  PROGRESS NOTE  Aaron Vega RWE:[id number] DOB: 1995/08/09 DOA: 11/26/2018 PCP: [facility] Admit date - 12/20/2018   Admitting Physician Assunta Found, MD  Outpatient Primary MD for the patient is [facility] [place]  LOS - 79 Brief Narrative    23 year old autistic male with autism and known epilepsy followed by [facility] and nonverbal at baseline, with marked cognitive decline and gait dysfunction since head injury after seizure on 08/2018 attributed to hypoxic ischemic encephalopathy following prolonged seizure. Prior to that he had a much better quality of life in terms of independence with ADLs. He presented on 12/17/2018 with severe obstipation, lethargy and vomiting with decreased urinary output.   Admitted with working diagnosis of subacute shock with lactic acidosis from aspiration pneumonia, metabolic encephalopathy and ileus.  Hospital course complicated by  -persistent lactic acidosis -Wernicke's encephalopathy in setting of severe thiamine deficiency, confirmed on MRI 11/16 (given rapid decline in cognition and gait in addition to new onset of peripheral neuropathic symptoms since the onset of intractable N/V) -Adynamic ileus and hepatic steatosis, 11/28 on Ct abd/pelvis -GBS treated with IVIG.   -Intubation for respiratory failure in setting of sepsis and inability to clear secretions (11/27 intubation, extubated 11/30) -Pressor support for hypotension, off on 11/30 -Frequent need for deep suctioning due to inability to clear secretions   Subjective  Mr.  Bommarito today had no acute events overnight.  low A & P   Hypotension, new.  Related to dehydration?  SBP's in the 90s to low 100s overnight with tachycardia.  Hemoglobin stable, white count improved.  Chest ray unremarkable.  Tolerating tube feeds. -Check random cortisol -Give 500 cc bolus, monitor response  Sinus tachycardia, worsening.  Has  mild tachycardia at baseline, acutely worsened to 120s 130s overnight.  EKG confirmed sinus.  Likely reflex tachycardia related to hypotension -We will hold off on increase metoprolol given concurrent hypotension as mentioned above -Check TSH -Hopeful for improvement with IV fluids  Acute on chronic hypoxemic respiratory failure, stable. Inability to clear secretions due to poor mentation, multifactorial neurologic weakness.  Currently being treated for aspiration pna. Hx of OSA, intolerant of CPAP.  -Last evaluated by PCCM on 12/7 and very high risk for reintubation, discussed with mother today she elects for DNI but to continue full scope of treatment -monitor in SDU, requiring frequent deep suctioning and Chest PT -high aspiration risk, NPO  Aspiration pneumonia.  Sepsis physiology resolved.  Blood cultures remain negative.  Afebrile over last 24 hours.  Completed previous course of cefepime, Flagyl (11/14-7/18, Unasyn 11/28-12/2) recent chest x-ray shows no overt opacities -Repeat blood cultures unremarkable so far -Completed 5 day course of Unasyn (restarted on 12/7 -Continue aspiration precautions as above -WBC improving  Hypokalemia, improving.  On daily potassium here but missed dose yesterday while holding use of newly inserted PEG tube -Check mag, continue 40 mEq daily potassium, add additional 40 mEq x 1 -Monitor BMP daily  Leukocytosis improving afebrile overnight. Repeat CXR shows no opacities. Unclear source of infection given no diarrhea, no change in abdominal exam and tolerating tube feeds ok. Clinically seems stable -repeat blood cultures if febrile again -monitor CBC  Dysphagia Decreased mentation, declining food, high aspiration risk -s/p IR PEG tube on 12/8 -tolerating TF at goal rate   Ascending weakness and metabolic encephalopathy, secondary to GBS + Wernicke's encephalopathy/B1 deficiency. Completed 5 day IVIG course and high dose thiamine -continue thiamine 100  mg daily and multivitamin  Epilepsy history,stable. EEG neg  for epileptic activity.  -Continue keppra and onfi, sz precautions -followed by [facility] -[facility] follow up provided per family request on discharge  Adynamic Ileus. Resolved on Abd xr. GI assisted with care. No long on magnesuim and erythromycin - maintain normal K and mg  Goals of care. Came in as hospice care patient and mother retracted that status.  I continue to have bedside conversations with his mother. She understands he has a very low risk of meaningful recovery. She is aware that he is less interactive the last 48-72 hours and has little buffer to sustain an acute clinical change. We discussed this likely being his new baseline--totally dependent on others with no meaningful interaction -Appreciate Palliative care support -Continue goals of care discussions with mother  Hypothyroidism, stable -Continue Synthroid  Hypertension, with lower BP as mentioned above -We will hold lisinopril until blood pressure improves   Severe malnutrition in context of acute illness/injury. -Continue tube feeds now at goal rate -Continue supplementing feeds   Family Communication  :  Mom updated at bedside  Code Status :  Partial/Full scope/Do not intubate  Disposition Plan  :  Close monitoring progressive unit for deep suctioning for respiratory status, tachycardia, hypotension, monitor neuro status. Very guarded prognosis  Consults  : Neurology, GI, PCCM, palliative  Procedures  :   10/16 NG tube placed per interventional radiology 11/15 Midline > 11/20 lumbar puncture 11/27 endotracheal intubation > 11/30 11/27 central line placement, right IJ > 12/1*  DVT Prophylaxis  :  Lovenox   Lab Results  Component Value Date   PLT 459 (H) 01/06/2019    Diet :  Diet Order            Diet NPO time specified  Diet effective now               Inpatient Medications Scheduled Meds: . chlorhexidine gluconate (MEDLINE  KIT)  15 mL Mouth Rinse BID  . Chlorhexidine Gluconate Cloth  6 each Topical Q0600  . cloBAZam  60 mg Per Tube BID  . enoxaparin (LOVENOX) injection  40 mg Subcutaneous Q12H  . feeding supplement (PRO-STAT SUGAR FREE 64)  60 mL Per Tube BID  . levothyroxine  25 mcg Per Tube Q0600  . lisinopril  40 mg Per NG tube Daily  . mouth rinse  15 mL Mouth Rinse q12n4p  . metoprolol tartrate  125 mg Per NG tube BID  . multivitamin  15 mL Per Tube Daily  . nystatin   Topical Daily  . pantoprazole sodium  40 mg Per Tube BID  . potassium chloride  40 mEq Per Tube Daily  . sodium chloride flush  10-40 mL Intracatheter Q12H  . sodium chloride flush  10-40 mL Intracatheter Q12H  . tamsulosin  0.4 mg Oral Daily  . thiamine injection  100 mg Intravenous Daily  . vitamin B-12  1,000 mcg Per Tube Daily   Continuous Infusions: . feeding supplement (VITAL 1.5 CAL) 1,000 mL (01/05/19 1001)  . levETIRAcetam 1,000 mg (01/06/19 1104)   PRN Meds:.acetaminophen (TYLENOL) oral liquid 160 mg/5 mL, fentaNYL (SUBLIMAZE) injection, ibuprofen, ipratropium-albuterol, labetalol, lip balm, LORazepam, metoprolol tartrate, ondansetron (ZOFRAN) IV, sodium chloride flush, sodium chloride flush, white petrolatum  Antibiotics  :   Anti-infectives (From admission, onward)   Start     Dose/Rate Route Frequency Ordered Stop   12/31/18 1100  Ampicillin-Sulbactam (UNASYN) 3 g in sodium chloride 0.9 % 100 mL IVPB     3 g 200 mL/hr over 30  Minutes Intravenous Every 6 hours 12/31/18 1000 01/05/19 0645   12/22/18 0900  ampicillin-sulbactam (UNASYN) 1.5 g in sodium chloride 0.9 % 100 mL IVPB  Status:  Discontinued     1.5 g 200 mL/hr over 30 Minutes Intravenous Every 6 hours 12/22/18 0851 12/22/18 0857   12/22/18 0900  Ampicillin-Sulbactam (UNASYN) 3 g in sodium chloride 0.9 % 100 mL IVPB     3 g 200 mL/hr over 30 Minutes Intravenous Every 6 hours 12/22/18 0857 12/26/18 2105   12/19/18 1400  erythromycin (EES) 400 MG/5ML suspension  500 mg  Status:  Discontinued     500 mg Per Tube Every 8 hours 12/19/18 1132 12/30/18 1013   12/18/18 1500  erythromycin 500 mg in sodium chloride 0.9 % 100 mL IVPB  Status:  Discontinued     500 mg 100 mL/hr over 60 Minutes Intravenous Every 8 hours 12/18/18 1449 12/19/18 1132   12/15/18 1400  erythromycin (EES) 400 MG/5ML suspension 400 mg  Status:  Discontinued     400 mg Per Tube Every 8 hours 12/15/18 1012 12/15/18 1130   12/15/18 1400  erythromycin 500 mg in sodium chloride 0.9 % 100 mL IVPB  Status:  Discontinued     500 mg 100 mL/hr over 60 Minutes Intravenous Every 8 hours 12/15/18 1130 12/18/18 1449   12/09/18 0600  vancomycin (VANCOCIN) 1,250 mg in sodium chloride 0.9 % 250 mL IVPB  Status:  Discontinued     1,250 mg 166.7 mL/hr over 90 Minutes Intravenous Every 12 hours 12/05/2018 1539 12/10/18 1043   12/09/18 0000  ceFEPIme (MAXIPIME) 2 g in sodium chloride 0.9 % 100 mL IVPB  Status:  Discontinued     2 g 200 mL/hr over 30 Minutes Intravenous Every 8 hours 12/07/2018 1539 12/11/18 1801   12/09/18 0000  metroNIDAZOLE (FLAGYL) IVPB 500 mg  Status:  Discontinued     500 mg 100 mL/hr over 60 Minutes Intravenous Every 8 hours 12/10/2018 2146 12/11/18 1801   12/14/2018 1415  ceFEPIme (MAXIPIME) 2 g in sodium chloride 0.9 % 100 mL IVPB     2 g 200 mL/hr over 30 Minutes Intravenous  Once 12/03/2018 1411 12/10/2018 1538   12/11/2018 1415  metroNIDAZOLE (FLAGYL) IVPB 500 mg     500 mg 100 mL/hr over 60 Minutes Intravenous  Once 12/19/2018 1411 12/04/2018 1609   12/21/2018 1415  vancomycin (VANCOCIN) IVPB 1000 mg/200 mL premix  Status:  Discontinued     1,000 mg 200 mL/hr over 60 Minutes Intravenous  Once 12/24/2018 1411 11/26/2018 1414   12/02/2018 1415  vancomycin (VANCOCIN) 2,500 mg in sodium chloride 0.9 % 500 mL IVPB     2,500 mg 250 mL/hr over 120 Minutes Intravenous  Once 12/01/2018 1414 12/12/2018 1710       Objective   Vitals:   01/06/19 0738 01/06/19 1107 01/06/19 1229 01/06/19 1536  BP: 97/85  (!) 116/95 114/70 106/63  Pulse: (!) 123 (!) 127 98 96  Resp: 20 19  (!) 21  Temp: 98.4 F (36.9 C)  98 F (36.7 C) 99.5 F (37.5 C)  TempSrc:    Rectal  SpO2: 99% 100%  96%  Weight:      Height:        SpO2: 96 % O2 Flow Rate (L/min): 1 L/min FiO2 (%): 28 %  Wt Readings from Last 3 Encounters:  01/06/19 (!) 140 kg  11/21/18 (!) 148.8 kg  11/08/18 (!) 148.8 kg     Intake/Output Summary (Last 24 hours)  at 01/06/2019 1635 Last data filed at 01/06/2019 1200 Gross per 24 hour  Intake 1220 ml  Output 3050 ml  Net -1830 ml    Physical Exam:  Obese, young male,  Opens eyes to voice Not following commands Diffuse rhonchi, without respiratory distress, occasionally tries to cough with poor effort No new F.N deficits,  [place].AT, Tachycardic, No Gallops,Rubs or new Murmurs,  Abdominal binder in place, diminished bowel sounds  I have personally reviewed the following:   Data Reviewed:  CBC Recent Labs  Lab 01/02/19 0426 01/03/19 0817 01/04/19 0640 01/05/19 0510 01/06/19 0440  WBC 7.8 9.5 9.8 13.2* 12.7*  HGB 9.5* 9.9* 10.1* 10.3* 9.9*  HCT 31.1* 32.2* 32.6* 33.3* 32.3*  PLT 411* 413* 409* 451* 459*  MCV 92.3 91.7 91.1 92.0 92.0  MCH 28.2 28.2 28.2 28.5 28.2  MCHC 30.5 30.7 31.0 30.9 30.7  RDW 17.3* 17.0* 17.2* 17.3* 17.5*    Chemistries  Recent Labs  Lab 01/01/19 0641 01/02/19 0426 01/03/19 0817 01/04/19 0640 01/05/19 1425 01/06/19 0440  NA 144 145 144 143 138 139  K 3.4* 3.4* 2.8* 3.8 4.4 4.4  CL 105 104 106 106 101 100  CO2 _0 GLUCOSE 97 103* 106* 103* 106* 111*  BUN _1 CREATININE 0.40* 0.40* <0.30* 0.42* <0.30* <0.30*  CALCIUM 9.4 9.6 9.2 9.2 9.4 9.2  MG 2.2  --  1.8  --   --   --    ------------------------------------------------------------------------------------------------------------------ No results for input(s): CHOL, HDL, LDLCALC, TRIG, CHOLHDL, LDLDIRECT in the last 72 hours.  No results found for:  HGBA1C ------------------------------------------------------------------------------------------------------------------ Recent Labs    01/06/19 1451  TSH 6.251*   ------------------------------------------------------------------------------------------------------------------ No results for input(s): VITAMINB12, FOLATE, FERRITIN, TIBC, IRON, RETICCTPCT in the last 72 hours.  Coagulation profile No results for input(s): INR, PROTIME in the last 168 hours.  No results for input(s): DDIMER in the last 72 hours.  Cardiac Enzymes No results for input(s): CKMB, TROPONINI, MYOGLOBIN in the last 168 hours.  Invalid input(s): CK ------------------------------------------------------------------------------------------------------------------    Component Value Date/Time   BNP 67.3 12/10/2018 0930    Micro Results Recent Results (from the past 240 hour(s))  C difficile quick scan w PCR reflex     Status: None   Collection Time: 12/30/18 11:50 AM   Specimen: STOOL  Result Value Ref Range Status   C Diff antigen NEGATIVE NEGATIVE Final   C Diff toxin NEGATIVE NEGATIVE Final   C Diff interpretation No C. difficile detected.  Final    Comment: Performed at [facility], [place] [address], [place], [place] [address]  Culture, blood (routine x 2)     Status: None (Preliminary result)   Collection Time: 01/02/19  9:03 AM   Specimen: BLOOD LEFT HAND  Result Value Ref Range Status   Specimen Description BLOOD LEFT HAND  Final   Special Requests   Final    BOTTLES DRAWN AEROBIC ONLY Blood Culture results may not be optimal due to an inadequate volume of blood received in culture bottles   Culture   Final    NO GROWTH 4 DAYS Performed at [facility], [place] [address], [place], [place] [address]    Report Status PENDING  Incomplete  Culture, blood (routine x 2)     Status: None (Preliminary result)   Collection Time: 01/02/19  1:29 PM   Specimen: BLOOD  Result Value Ref Range  Status  Specimen Description BLOOD LEFT FOOT  Final   Special Requests   Final    BOTTLES DRAWN AEROBIC ONLY Blood Culture adequate volume   Culture   Final    NO GROWTH 4 DAYS Performed at [facility], [address], [place], [place] [address]    Report Status PENDING  Incomplete    Radiology Reports DG Abd 1 View  Result Date: 12/30/2018 CLINICAL DATA:  Reason for exam: Vomiting, aspiration into airway Hx HTN, pre-diabetes, fatty liver, pancreatitis, pneumonia EXAM: ABDOMEN - 1 VIEW COMPARISON:  12/25/2018 FINDINGS: Feeding tube tip in the distal duodenum. Stomach appears decompressed. Multiple dilated small bowel loops in the mid abdomen, increased since previous. Scattered gas in the nondilated colon. CLINICAL DATA:  Reason for exam: Vomiting, aspiration into airway Hx HTN, pre-diabetes, fatty liver, pancreatitis, pneumonia EXAM: ABDOMEN - 1 VIEW COMPARISON:  12/25/2018 FINDINGS: Feeding tube tip in the distal duodenum. Stomach appears decompressed. Multiple dilated small bowel loops in the mid abdomen, increased since previous. Scattered gas in the nondilated colon. IMPRESSION: 1. Multiple dilated small bowel loops in the mid abdomen, increased since previous, suggesting obstruction or developing ileus. 2. Feeding tube tip in the distal duodenum.  Stomach  decompressed. Electronically Signed   By: Lucrezia Europe M.D.   On: 12/30/2018 09:43   DG Abd 1 View  Result Date: 12/24/2018 CLINICAL DATA:  Nasogastric tube placement. EXAM: ABDOMEN - 1 VIEW COMPARISON:  December 21, 2018. FINDINGS: The bowel gas pattern is normal. Nasogastric tube tip is seen in expected position of third portion of duodenum. No radio-opaque calculi or other significant radiographic abnormality are seen. IMPRESSION: Nasogastric tube tip seen in expected position of third portion of duodenum. No evidence of bowel obstruction or ileus. Electronically Signed   By: Marijo Conception M.D.   On: 12/24/2018 14:39   DG Abd  1 View  Result Date: 12/16/2018 CLINICAL DATA:  Ileus. EXAM: ABDOMEN - 1 VIEW COMPARISON:  None. FINDINGS: There is a dilated loop of small bowel in the mid abdomen measuring approximately 3.6 cm. There is no obvious pneumatosis or free air. The patient's enteric tube is outside the field of view. IMPRESSION: Mildly dilated loop of small bowel in the mid abdomen is nonspecific and could represent an ileus or partial small bowel obstruction the appropriate clinical setting. Electronically Signed   By: Constance Holster M.D.   On: 12/16/2018 15:04   DG Abd 1 View  Result Date: 12/14/2018 CLINICAL DATA:  Nasogastric tube advancement. EXAM: ABDOMEN - 1 VIEW COMPARISON:  One view abdomen 12/12/2018 and 12/10/2018. CT 12/21/2018. FINDINGS: 0936 hours. Two views are submitted. The tip of the endotracheal tube is unchanged, overlying the mid stomach. The stomach and colon are mildly distended with gas. No significant small bowel distention identified. There is no evidence of free intraperitoneal air. Atelectasis is present at both lung bases. IMPRESSION: Stable location of the nasogastric tube. No evidence of bowel obstruction. Electronically Signed   By: Richardean Sale M.D.   On: 12/14/2018 09:53   DG Abd 1 View  Result Date: 12/10/2018 CLINICAL DATA:  23 year old male status post NG tube placement. EXAM: ABDOMEN - 1 VIEW COMPARISON:  Abdominal radiograph dated 12/10/2018. FINDINGS: Partially visualized enteric tube with tip and side-port over the gastric air. The osseous structures and soft tissues appear unremarkable. IMPRESSION: Enteric tube with tip and side-port over the gastric air. Electronically Signed   By: Anner Crete M.D.   On: 12/10/2018 14:41  CT Head Wo Contrast  Result Date: 12/03/2018 CLINICAL DATA:  Altered level of consciousness EXAM: CT HEAD WITHOUT CONTRAST TECHNIQUE: Contiguous axial images were obtained from the base of the skull through the vertex without intravenous  contrast. COMPARISON:  11/03/2018 FINDINGS: Brain: The brainstem, cerebellum, cerebral peduncles, thalami, basal ganglia, basilar cisterns, and ventricular system appear within normal limits. No intracranial hemorrhage, mass lesion, or acute CVA. Vascular: Unremarkable Skull: Unremarkable Sinuses/Orbits: Mild chronic right frontal sinusitis. Other: No supplemental non-categorized findings. IMPRESSION: 1. No significant intracranial abnormality is observed. 2. Mild chronic right frontal sinusitis. Electronically Signed   By: Van Clines M.D.   On: 12/07/2018 19:02   CT THORACIC SPINE WO CONTRAST  Result Date: 12/14/2018 CLINICAL DATA:  Myelopathy, acute or progressive EXAM: CT THORACIC SPINE WITHOUT CONTRAST TECHNIQUE: Multidetector CT images of the thoracic were obtained using the standard protocol without intravenous contrast. COMPARISON:  None. FINDINGS: Alignment: Normal Vertebrae: Normal vertebra.  No fracture or mass. Paraspinal and other soft tissues: Negative for paraspinous mass or adenopathy. NG tube in place Left lower lobe small infiltrate most likely pneumonia. Fatty liver with hepatomegaly. Disc levels: Disc spaces are well preserved. No significant disc degeneration or spurring. Negative for spinal stenosis. IMPRESSION: Negative CT thoracic spine Left lower lobe infiltrate, probable pneumonia Electronically Signed   By: Franchot Gallo M.D.   On: 12/14/2018 20:48   CT LUMBAR SPINE WO CONTRAST  Result Date: 12/14/2018 CLINICAL DATA:  Myelopathy, acute or progressive. EXAM: CT LUMBAR SPINE WITHOUT CONTRAST TECHNIQUE: Multidetector CT imaging of the lumbar spine was performed without intravenous contrast administration. Multiplanar CT image reconstructions were also generated. COMPARISON:  None. FINDINGS: Segmentation: Normal Alignment: Normal Vertebrae: Negative for fracture or mass.  Normal vertebra. Paraspinal and other soft tissues: Negative for paraspinous mass or adenopathy. No soft  tissue edema. Disc levels: Disc spaces maintained. No significant disc degeneration or spinal stenosis. IMPRESSION: Negative CT lumbar spine. Electronically Signed   By: Franchot Gallo M.D.   On: 12/14/2018 20:51   MR BRAIN W WO CONTRAST  Result Date: 12/10/2018 CLINICAL DATA:  Slowly progressive ataxia.  History of seizures. EXAM: MRI HEAD WITHOUT AND WITH CONTRAST TECHNIQUE: Multiplanar, multiecho pulse sequences of the brain and surrounding structures were obtained without and with intravenous contrast. CONTRAST:  21m GADAVIST GADOBUTROL 1 MMOL/ML IV SOLN COMPARISON:  Head CT 12/23/2018 FINDINGS: Brain: There is considerable motion degradation. There is abnormal T2 signal and restricted diffusion in both medial thalami. The most likely explanation is Wernicke encephalopathy. The remainder of the brain appears normal. No evidence of ischemic infarction, mass lesion, hemorrhage, hydrocephalus or extra-axial collection. No abnormal contrast enhancement is demonstrated. Vascular: Major vessels at the base of the brain show flow. Skull and upper cervical spine: Negative Sinuses/Orbits: Clear/normal Other: None IMPRESSION: Abnormal T2 signal and restricted diffusion in both medial thalami. 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 Electronically Signed   By: MNelson ChimesM.D.   On: 12/10/2018 17:16   MR CERVICAL SPINE W WO CONTRAST  Result Date: 12/10/2018 CLINICAL DATA:  Slowly progressive ataxia and seizures. EXAM: MRI CERVICAL SPINE WITHOUT AND WITH CONTRAST TECHNIQUE: Multiplanar and multiecho pulse sequences of the cervical spine, to include the craniocervical junction and cervicothoracic junction, were obtained without and with intravenous contrast. CONTRAST:  157mGADAVIST GADOBUTROL 1 MMOL/ML IV SOLN COMPARISON:  None. FINDINGS: The study suffers from considerable motion degradation. Alignment: Normal Vertebrae: Normal Cord: Normal Posterior Fossa, vertebral arteries, paraspinal tissues: See  results of brain MRI. Paraspinal regions  negative. Disc levels: Normal IMPRESSION: Motion degraded exam.  No abnormality seen in the cervical region. Electronically Signed   By: Nelson Chimes M.D.   On: 12/10/2018 17:20   MR THORACIC SPINE W WO CONTRAST  Result Date: 12/19/2018 CLINICAL DATA:  Quadriplegia EXAM: MRI THORACIC AND LUMBAR SPINE WITHOUT AND WITH CONTRAST TECHNIQUE: Multiplanar and multiecho pulse sequences of the thoracic and lumbar spine were obtained without and with intravenous contrast. CONTRAST:  83m GADAVIST GADOBUTROL 1 MMOL/ML IV SOLN COMPARISON:  CT thoracic lumbar 12/14/2018 FINDINGS: MRI THORACIC SPINE FINDINGS Alignment:  Normal. Image quality degraded by moderate to extensive motion. Vertebrae: Negative for fracture or mass. Normal enhancement of the spine. Cord: Limited cord evaluation due to motion. No cord lesion or cord compression identified. Paraspinal and other soft tissues: Negative Disc levels: Negative for disc protrusion or spinal stenosis. MRI LUMBAR SPINE FINDINGS Segmentation:  Normal Motion degraded study. There is progressive motion on the study which becomes more severe on the axial images. Alignment:  Normal Vertebrae:  Negative for fracture or mass. Conus medullaris and cauda equina: Conus extends to the T12-L1 level. Conus and cauda equina appear normal. Paraspinal and other soft tissues: Negative for paraspinous mass or adenopathy. Normal soft tissue enhancement. Disc levels: No significant abnormality at L3-4 or above Mild disc bulging and mild facet degeneration L4-5 and L5-S1. Negative for disc protrusion or stenosis IMPRESSION: Motion degraded study, worse on the thoracic compared to the lumbar spine. No acute thoracic abnormality. Negative for fracture, infection, or spinal stenosis. No acute lumbar abnormality. Mild disc and facet degeneration L4-5 and L5-S1 without stenosis. Electronically Signed   By: CFranchot GalloM.D.   On: 12/19/2018 12:05   MR Lumbar  Spine W Wo Contrast  Result Date: 12/19/2018 CLINICAL DATA:  Quadriplegia EXAM: MRI THORACIC AND LUMBAR SPINE WITHOUT AND WITH CONTRAST TECHNIQUE: Multiplanar and multiecho pulse sequences of the thoracic and lumbar spine were obtained without and with intravenous contrast. CONTRAST:  119mGADAVIST GADOBUTROL 1 MMOL/ML IV SOLN COMPARISON:  CT thoracic lumbar 12/14/2018 FINDINGS: MRI THORACIC SPINE FINDINGS Alignment:  Normal. Image quality degraded by moderate to extensive motion. Vertebrae: Negative for fracture or mass. Normal enhancement of the spine. Cord: Limited cord evaluation due to motion. No cord lesion or cord compression identified. Paraspinal and other soft tissues: Negative Disc levels: Negative for disc protrusion or spinal stenosis. MRI LUMBAR SPINE FINDINGS Segmentation:  Normal Motion degraded study. There is progressive motion on the study which becomes more severe on the axial images. Alignment:  Normal Vertebrae:  Negative for fracture or mass. Conus medullaris and cauda equina: Conus extends to the T12-L1 level. Conus and cauda equina appear normal. Paraspinal and other soft tissues: Negative for paraspinous mass or adenopathy. Normal soft tissue enhancement. Disc levels: No significant abnormality at L3-4 or above Mild disc bulging and mild facet degeneration L4-5 and L5-S1. Negative for disc protrusion or stenosis IMPRESSION: Motion degraded study, worse on the thoracic compared to the lumbar spine. No acute thoracic abnormality. Negative for fracture, infection, or spinal stenosis. No acute lumbar abnormality. Mild disc and facet degeneration L4-5 and L5-S1 without stenosis. Electronically Signed   By: ChFranchot Gallo.D.   On: 12/19/2018 12:05   CT ABDOMEN PELVIS W CONTRAST  Result Date: 12/22/2018 CLINICAL DATA:  Persistent ileus. EXAM: CT ABDOMEN AND PELVIS WITH CONTRAST TECHNIQUE: Multidetector CT imaging of the abdomen and pelvis was performed using the standard protocol  following bolus administration of intravenous contrast. CONTRAST:  10048mMNIPAQUE  IOHEXOL 300 MG/ML  SOLN COMPARISON:  12/01/2018 FINDINGS: Lower chest: Normal heart size. Airspace disease in both lower lungs, most confluent in the medial left lower lobe. Hepatobiliary: Hepatic steatosis which is diffuse and prominent.No evidence of biliary obstruction or stone. Pancreas: Unremarkable. Spleen: Unremarkable. Adrenals/Urinary Tract: Negative adrenals. No hydronephrosis or stone. Bladder is decompressed by Foley catheter. Stomach/Bowel: Fluid levels seen throughout small and large bowel with moderate distention of bowel loops. A rectal tube is in place. Maximal colonic thickening is at the ascending segment-8.4 cm. No bowel wall thickening. New high-density within the appendix attributed to barium retention. There is mild widening of the midportion appendix that is stable, no periappendiceal inflammation. Enteric tube tip in good position at the proximal stomach. Vascular/Lymphatic: No acute vascular abnormality. No mass or adenopathy. Reproductive:Negative Other: No ascites or pneumoperitoneum. Musculoskeletal: No acute abnormalities. IMPRESSION: 1. Adynamic ileus pattern that has progressed from 12/20/2018. 2. Hepatic steatosis. 3. Pneumonia both lung bases, progressed from comparison. Electronically Signed   By: Monte Fantasia M.D.   On: 12/22/2018 15:42   CT ABDOMEN PELVIS W CONTRAST  Result Date: 12/24/2018 CLINICAL DATA:  Abdominal pain. Shortness of breath. EXAM: CT ABDOMEN AND PELVIS WITH CONTRAST TECHNIQUE: Multidetector CT imaging of the abdomen and pelvis was performed using the standard protocol following bolus administration of intravenous contrast. CONTRAST:  198m OMNIPAQUE IOHEXOL 350 MG/ML SOLN COMPARISON:  11/24/2018 FINDINGS: Lower chest: New airspace opacity in the left lower lobe suspicious for pneumonia. Air fluid level in the distal esophagus probably from reflux. Hepatobiliary: Diffuse  hepatic steatosis is suspected. Otherwise unremarkable. Pancreas: Unremarkable Spleen: Unremarkable Adrenals/Urinary Tract: Unremarkable Stomach/Bowel: There are a few mildly dilated loops of small bowel measuring up to 3.5 cm in diameter, with scattered air-fluid levels but without an obvious transition point or site of obstruction. Vascular/Lymphatic: Borderline prominent right gastric node 0.9 cm in short axis on image 24/3, previously 0.8 cm. Patient has an unusually small in caliber abdominal aorta, measuring 0.9 cm in diameter. Common origin of the celiac trunk and SMA. No obvious periaortic inflammatory findings. Reproductive: Unremarkable Other: No supplemental non-categorized findings. Musculoskeletal: Unremarkable IMPRESSION: 1. New airspace opacity in the left lower lobe suspicious for pneumonia. 2. There are a few mildly dilated loops of small bowel with scattered air-fluid levels but without an obvious transition point or site of obstruction. Ileus is favored over low-grade partial small bowel obstruction. 3. Diffuse hepatic steatosis. 4. Air fluid level in the distal esophagus probably from reflux. 5. Small caliber abdominal aorta, only about 0.9 cm in diameter, without periaortic inflammatory findings. 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 Consider follow up vascular surgical consultation. Electronically Signed   By: WVan ClinesM.D.   On: 12/20/2018 19:15   IR GASTROSTOMY TUBE MOD SED  Result Date: 01/02/2019 INDICATION: Dysphagia. Please place percutaneous gastrostomy tube for enteric nutrition supplementation purposes. EXAM: PULL TROUGH GASTROSTOMY TUBE PLACEMENT COMPARISON:  CT abdomen pelvis-12/22/2018 MEDICATIONS: Patient is currently admitted to the hospital and receiving intravenous antibiotics; Antibiotics were administered within 1 hour of the procedure. Glucagon 1 mg IV CONTRAST:  30 mL of Isovue-300 administered into the gastric lumen. ANESTHESIA/SEDATION: Moderate  (conscious) sedation was employed during this procedure. A total of Versed 1 mg and Fentanyl 50 mcg was administered intravenously. Moderate Sedation Time: 10 minutes. The patient's level of consciousness and vital signs were monitored continuously by radiology nursing throughout the procedure under my direct supervision. FLUOROSCOPY TIME:  3 minutes, 30 seconds (89 mGy) COMPLICATIONS: None immediate. PROCEDURE:  Informed written consent was obtained from the patient following explanation of the procedure, risks, benefits and alternatives. A time out was performed prior to the initiation of the procedure. Ultrasound scanning was performed to demarcate the edge of the left lobe of the liver. Maximal barrier sterile technique utilized including caps, mask, sterile gowns, sterile gloves, large sterile drape, hand hygiene and Betadine prep. The left upper quadrant was sterilely prepped and draped. An oral gastric catheter was inserted into the stomach under fluoroscopy. The existing nasogastric feeding tube was removed. The left costal margin and barium/air opacified transverse colon were identified and avoided. Air was injected into the stomach for insufflation and visualization under fluoroscopy. Under sterile conditions a 17 gauge trocar needle was utilized to access the stomach percutaneously beneath the left subcostal margin after the overlying soft tissues were anesthetized with 1% Lidocaine with epinephrine. Needle position was confirmed within the stomach with aspiration of air and injection of small amount of contrast. A single T tack was deployed for gastropexy. Over an Amplatz guide wire, a 9-French sheath was inserted into the stomach. A snare device was utilized to capture the oral gastric catheter. The snare device was pulled retrograde from the stomach up the esophagus and out the oropharynx. The 20-French pull-through gastrostomy was connected to the snare device and pulled antegrade through the  oropharynx down the esophagus into the stomach and then through the percutaneous tract external to the patient. The gastrostomy was assembled externally. Contrast injection confirms position in the stomach. Several spot radiographic images were obtained in various obliquities for documentation. The patient tolerated procedure well without immediate post procedural complication. FINDINGS: After successful fluoroscopic guided placement, the gastrostomy tube is appropriately positioned with internal disc against the ventral aspect of the gastric lumen. IMPRESSION: Successful fluoroscopic insertion of a 20-French pull-through gastrostomy tube. The gastrostomy may be used immediately for medication administration and in 24 hrs for the initiation of feeds. Electronically Signed   By: Sandi Mariscal M.D.   On: 01/02/2019 10:14   DG CHEST PORT 1 VIEW  Result Date: 01/05/2019 CLINICAL DATA:  Hypoxia, fever. EXAM: PORTABLE CHEST 1 VIEW COMPARISON:  December 31, 2018. FINDINGS: The heart size and mediastinal contours are within normal limits. Both lungs are clear. Right-sided PICC line is noted with tip in expected position of right atrium. No pneumothorax or pleural effusion is noted. The visualized skeletal structures are unremarkable. IMPRESSION: No active disease. Electronically Signed   By: Marijo Conception M.D.   On: 01/05/2019 11:25   DG CHEST PORT 1 VIEW  Result Date: 12/31/2018 CLINICAL DATA:  23 year old male with a history aspiration EXAM: PORTABLE CHEST 1 VIEW COMPARISON:  12/30/2018, 12/28/2018 FINDINGS: Cardiomediastinal silhouette unchanged in size and contour. Low lung volumes persist with linear opacities at the lung bases. No pneumothorax or large pleural effusion. Crowding of the interstitium and the vasculature. Enteric tube projects over the mediastinum and terminates out of the field of view. IMPRESSION: Unchanged appearance of the chest x-ray with low lung volumes and likely atelectasis. Unchanged  enteric feeding tube. Electronically Signed   By: Corrie Mckusick D.O.   On: 12/31/2018 07:45   DG CHEST PORT 1 VIEW  Result Date: 12/30/2018 CLINICAL DATA:  Reason for exam: Vomiting, aspiration into airway Hx HTN, pre-diabetes, fatty liver, pancreatitis, pneumonia EXAM: PORTABLE CHEST - 1 VIEW COMPARISON:  12/28/2018 FINDINGS: Feeding tube remains in place. Persistent low lung volumes with coarse perihilar and bibasilar interstitial markings. No new infiltrate or edema. Heart size  and mediastinal contours are within normal limits. No effusion. Visualized bones unremarkable. IMPRESSION: 1. Stable chest x-ray. No new infiltrate or edema. 2. Stable appearance of the cardiomediastinal silhouette. Electronically Signed   By: Lucrezia Europe M.D.   On: 12/30/2018 09:42   DG CHEST PORT 1 VIEW  Result Date: 12/28/2018 CLINICAL DATA:  Updated status.  Fever. EXAM: PORTABLE CHEST 1 VIEW COMPARISON:  December 26, 2018 FINDINGS: The feeding tube terminates below today's film. Mild opacity in left base is somewhat streaky in platelike laterally. Low lung volumes. No pneumothorax. The lungs are otherwise clear. The cardiomediastinal silhouette is stable. IMPRESSION: Streaky opacity in left base is favored to represent atelectasis given the platelike configuration laterally. Developing infiltrate not completely excluded. Recommend attention on follow-up. 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 EXAM: PORTABLE CHEST 1 VIEW COMPARISON:  Portable chest 12/21/2018. FINDINGS: Portable AP semi upright view at 0052 hours. Extubated and NG type tube removed since 12/21/2018. Lower lung volumes. Mediastinal contours remain normal. There is an enteric feeding tube in place which courses to the abdomen and probably crosses midline to the right but the tip is not  included. Increased crowding of lung markings and/or pulmonary vascularity without overt edema. No pneumothorax, pleural effusion or consolidation. Stable visible bowel gas pattern. IMPRESSION: 1. Extubated with lower lung volumes. No other acute cardiopulmonary abnormality. 2. Enteric feeding tube courses to the abdomen, tip not included. Electronically Signed   By: Genevie Ann M.D.   On: 12/26/2018 01:08   DG CHEST PORT 1 VIEW  Result Date: 12/21/2018 CLINICAL DATA:  23 year old male with history of central line and nasogastric tube placement. EXAM: PORTABLE CHEST 1 VIEW COMPARISON:  Chest x-ray 12/21/2018. FINDINGS: An endotracheal tube is in place with tip 4.1 cm above the carina. There is a right-sided internal jugular central venous catheter with tip terminating in the distal superior vena cava. Nasogastric tube extending into the proximal stomach with side port just distal to the gastroesophageal junction. No pneumothorax. Lung volumes are low. Ill-defined opacities in the medial aspect of the left lung base and medial aspect of the right upper lobe. No pleural effusions. No evidence of pulmonary edema. Heart size is normal. IMPRESSION: 1. Support apparatus, as above. 2. Ill-defined opacities in the medial left lower lobe and medial right upper lobe concerning for multilobar pneumonia. Electronically Signed   By: Vinnie Langton M.D.   On: 12/21/2018 17:22   DG CHEST PORT 1 VIEW  Result Date: 12/21/2018 CLINICAL DATA:  Aspiration, possible seizure EXAM: PORTABLE CHEST 1 VIEW COMPARISON:  12/20/2018 FINDINGS: No significant change in low volume AP portable examination without significant acute appearing airspace opacity. Esophagogastric tube remains with tip and side port below the diaphragm. Heart and mediastinum are unremarkable. IMPRESSION: No significant change in low volume portable chest x-ray. No new findings. Electronically Signed   By: Eddie Candle M.D.   On: 12/21/2018 11:32   DG CHEST PORT  1 VIEW  Result Date: 12/20/2018 CLINICAL DATA:  Fever. History of hypertension seizures and pancreatitis. EXAM: PORTABLE CHEST 1 VIEW COMPARISON:  12/16/2018 and older exams. FINDINGS: Lung volumes are low. Atelectasis in the medial lung bases. Lungs otherwise clear. No pleural effusion or pneumothorax. Cardiac silhouette normal in size.  No mediastinal or hilar masses. Nasogastric tube passes below the diaphragm well  into the stomach. IMPRESSION: 1. No acute cardiopulmonary disease. Electronically Signed   By: Lajean Manes M.D.   On: 12/20/2018 13:39   DG CHEST PORT 1 VIEW  Result Date: 12/16/2018 CLINICAL DATA:  Shortness of breath. EXAM: PORTABLE CHEST 1 VIEW COMPARISON:  December 15, 2018. FINDINGS: Again noted are low lung volumes with bibasilar atelectasis. The cardiac silhouette remains enlarged. There is no pneumothorax. No large pleural effusion. No acute osseous abnormality. An enteric tube is noted. IMPRESSION: Stable appearance of the chest. Electronically Signed   By: Constance Holster M.D.   On: 12/16/2018 15:02   DG Chest Port 1 View  Result Date: 12/15/2018 CLINICAL DATA:  Respiratory failure. EXAM: PORTABLE CHEST 1 VIEW COMPARISON:  12/14/2018 FINDINGS: 0514 hours. Low lung volumes. Interval improvement in left basilar aeration. The lungs are clear without focal pneumonia, edema, pneumothorax or pleural effusion. Cardiopericardial silhouette accentuated by low volume AP technique. NG tube tip is in the mid stomach. Telemetry leads overlie the chest. IMPRESSION: Low volume film without acute cardiopulmonary findings. Electronically Signed   By: Misty Stanley M.D.   On: 12/15/2018 07:37   DG CHEST PORT 1 VIEW  Result Date: 12/14/2018 CLINICAL DATA:  Shortness of breath. EXAM: PORTABLE CHEST 1 VIEW COMPARISON:  Chest radiograph 12/10/2018, abdominal radiograph 12/12/2018 FINDINGS: Enteric tube is been retracted, tip now in the distal esophagus, side-port in the mid esophagus. Low  lung volumes similar to prior exam. Slight increasing patchy opacities at the left lung base. Unchanged heart size and mediastinal contours. No pleural fluid or pneumothorax. IMPRESSION: 1. Enteric tube has been retracted, tip now in the distal esophagus, side-port in the mid esophagus. Recommend advancement of least 10 cm place the side-port below the diaphragm. 2. Persistent low lung volumes. Increasing patchy opacities at the left lung base, may be atelectasis or pneumonia. Electronically Signed   By: Keith Rake M.D.   On: 12/14/2018 05:22   DG CHEST PORT 1 VIEW  Result Date: 12/10/2018 CLINICAL DATA:  Dyspnea. EXAM: PORTABLE CHEST 1 VIEW COMPARISON:  December 09, 2018. FINDINGS: The heart size and mediastinal contours are within normal limits. No pneumothorax or pleural effusion is noted. Hypoinflation of the lungs is noted with minimal bibasilar subsegmental atelectasis. The visualized skeletal structures are unremarkable. IMPRESSION: Hypoinflation of the lungs with minimal bibasilar subsegmental atelectasis. Electronically Signed   By: Marijo Conception M.D.   On: 12/10/2018 09:17   DG CHEST PORT 1 VIEW  Result Date: 12/09/2018 CLINICAL DATA:  Tachypnea, low-grade fever EXAM: PORTABLE CHEST 1 VIEW COMPARISON:  12/04/2018 FINDINGS: Low lung volumes. Heart size is accentuated by the low volumes. Bibasilar atelectasis. No effusions. No acute bony abnormality. IMPRESSION: Low lung volumes, bibasilar atelectasis. Electronically Signed   By: Rolm Baptise M.D.   On: 12/09/2018 21:38   DG Chest Port 1 View  Result Date: 11/29/2018 CLINICAL DATA:  Sepsis. EXAM: PORTABLE CHEST 1 VIEW COMPARISON:  11/21/2018 FINDINGS: A very poor inspiration is again demonstrated. The heart remains grossly normal in size. Interval minimal patchy and linear density in the left lower lobe. Clear right lung. Mild peribronchial thickening. Unremarkable bones. IMPRESSION: 1. Interval minimal patchy and linear atelectasis or  pneumonia in the left lower lobe. 2. Mild bronchitic changes. 3. Stable very poor inspiration. Electronically Signed   By: Claudie Revering M.D.   On: 11/28/2018 15:47   DG Abd Portable 1V  Result Date: 01/02/2019 CLINICAL DATA:  Severe sepsis. EXAM: PORTABLE ABDOMEN - 1 VIEW COMPARISON:  12/30/2018  FINDINGS: Air is present throughout the colon. Contrast is present over the right colon, terminal ileum and appendix. Small caliber probe projects over the rectum. Enteric tube is present with tip in the midline of the mid abdomen likely over the distal duodenum near the duodenal jejunal junction. Remainder the exam is unchanged IMPRESSION: 1.  Nonobstructive bowel gas pattern as described. 2. Enteric tube with tip unchanged just left of midline in the upper abdomen likely over the third/fourth portion of the duodenum. Electronically Signed   By: Marin Olp M.D.   On: 01/02/2019 08:03   DG Abd Portable 1V  Result Date: 12/30/2018 CLINICAL DATA:  Ileus EXAM: PORTABLE ABDOMEN - 1 VIEW COMPARISON:  Portable exam 1715 hours compared to 0906 hours FINDINGS: Tip of feeding tube projects over the third portion of duodenum approaching ligament of Treitz. Scattered gas throughout bowel, decreased from prior exam. No bowel dilatation or bowel wall thickening. Osseous structures unremarkable. No urinary tract calcifications. IMPRESSION: Nonspecific bowel gas pattern. Decreased gaseous distention of bowel since prior study. Electronically Signed   By: Lavonia Dana M.D.   On: 12/30/2018 19:20   DG Abd Portable 1V  Result Date: 12/25/2018 CLINICAL DATA:  Feeding tube placement. EXAM: PORTABLE ABDOMEN - 1 VIEW COMPARISON:  December 24, 2018. FINDINGS: The bowel gas pattern is normal. Distal tip of feeding tube is seen in expected position of proximal duodenum. No radio-opaque calculi or other significant radiographic abnormality are seen. IMPRESSION: Negative. Electronically Signed   By: Marijo Conception M.D.   On: 12/25/2018  16:06   DG Abd Portable 1V  Result Date: 12/21/2018 CLINICAL DATA:  Nasogastric tube placement. EXAM: PORTABLE ABDOMEN - 1 VIEW COMPARISON:  December 20, 2018. FINDINGS: Mildly dilated air-filled large and small bowel loops are noted which may represent ileus. Distal tip of nasogastric tube is seen in proximal stomach. IMPRESSION: Distal tip of nasogastric tube seen in proximal stomach. Mildly dilated air-filled large and small bowel loops are noted which may represent ileus. Electronically Signed   By: Marijo Conception M.D.   On: 12/21/2018 17:22   DG Abd Portable 1V  Result Date: 12/20/2018 CLINICAL DATA:  NG placement EXAM: PORTABLE ABDOMEN - 1 VIEW COMPARISON:  Radiograph 12/20/2018 FINDINGS: Transesophageal tube tip and side port distal to the GE junction, curling in the left upper quadrant in the region of the gastric body. Atelectatic changes in the otherwise clear lung bases. Gaseous distention of what appears to be the colon without high-grade bowel obstruction. IMPRESSION: 1. Transesophageal tube tip and side port in the region of the gastric body. 2. Gaseous distention of the colon without high-grade bowel obstruction. Electronically Signed   By: Lovena Le M.D.   On: 12/20/2018 02:45   DG Abd Portable 1V  Result Date: 12/20/2018 CLINICAL DATA:  Check gastric catheter placement EXAM: PORTABLE ABDOMEN - 1 VIEW COMPARISON:  12/20/2018 FINDINGS: Gastric catheter has been advanced with the tip in the stomach. Proximal side port lies at the gastroesophageal junction. This could be advanced several cm. The lungs are clear. IMPRESSION: Gastric catheter as described. The proximal side port is not within the stomach and should be advanced several cm. Electronically Signed   By: Inez Catalina M.D.   On: 12/20/2018 01:50   DG Abd Portable 1V  Result Date: 12/20/2018 CLINICAL DATA:  NG tube placement EXAM: PORTABLE ABDOMEN - 1 VIEW COMPARISON:  12/16/2018 abdominal radiograph FINDINGS: The side  port of the NG tube is in the distal esophagus.  Recommend advancing by 7-10 cm. Lungs are clear. Nonobstructive bowel gas pattern. IMPRESSION: 1. Side-port of the nasogastric tube in the distal esophagus. Recommend advancing by 7-10 cm. 2. No active cardiopulmonary disease. Electronically Signed   By: Ulyses Jarred M.D.   On: 12/20/2018 00:41   DG Abd Portable 1V  Result Date: 12/12/2018 CLINICAL DATA:  NG tube placement. EXAM: PORTABLE ABDOMEN - 1 VIEW COMPARISON:  Radiograph dated 12/10/2018 FINDINGS: NG tube has been inserted and the tip is in the midbody of the stomach. The visualized bowel gas pattern is normal. No visible acute bone abnormality. IMPRESSION: NG tube in the body of the stomach. Electronically Signed   By: Lorriane Shire M.D.   On: 12/12/2018 21:18   DG Abd Portable 1V  Result Date: 12/10/2018 CLINICAL DATA:  Dyspnea, abdominal pain and distention EXAM: PORTABLE ABDOMEN - 1 VIEW COMPARISON:  12/19/2018 abdominal radiograph FINDINGS: There are mildly dilated small bowel loops throughout the abdomen measuring up to 3.9 cm diameter in the left abdomen, mildly decreased in caliber. No evidence of pneumatosis or pneumoperitoneum. No radiopaque nephrolithiasis. IMPRESSION: Diffuse mild small bowel dilatation, mildly decreased, which could represent either improving adynamic ileus or improving partial distal small bowel obstruction. Electronically Signed   By: Ilona Sorrel M.D.   On: 12/10/2018 09:19   DG Abd Portable 1V  Result Date: 12/09/2018 CLINICAL DATA:  Nasogastric tube placement. EXAM: PORTABLE ABDOMEN - 1 VIEW COMPARISON:  12/15/2018 FINDINGS: No nasogastric tube seen in the lower chest or upper abdomen. Multiple dilated small bowel loops with mild improvement. Gas is again demonstrated in normal caliber colon. Mild scoliosis. IMPRESSION: 1. No nasogastric tube seen in the lower chest or upper abdomen. 2. Mildly improved small bowel obstruction. Electronically Signed   By:  Claudie Revering M.D.   On: 12/09/2018 11:40   DG Abd Portable 1V  Result Date: 12/22/2018 CLINICAL DATA:  Sepsis. Fever. EXAM: PORTABLE ABDOMEN - 1 VIEW COMPARISON:  11/23/2018 and abdomen and pelvis CT dated 11/24/2018. FINDINGS: Interval multiple mildly dilated loops of proximal small bowel with gas-filled normal caliber distal small bowel and colon. Unremarkable bones. IMPRESSION: Interval mild small bowel ileus or partial obstruction. No gross free air seen at this time. Electronically Signed   By: Claudie Revering M.D.   On: 12/21/2018 15:49   EEG adult  Result Date: 12/21/2018 Lora Havens, MD     12/21/2018 12:59 PM Patient Name:Aaron Vega UUV:[id number] Epilepsy Attending:Priyanka Barbra Vega Referring Physician/Provider:Dr Kathrynn Speed Date: 12/21/2018 Duration: 23.02 minutes  Patient history:23 year old male with known epilepsy who was noted to have episode of extension of arm with eyes rolling upwards concerning for seizure.. EEG to evaluate for seizures.   Level of alertness:Awake  AEDs during EEG study:Keppra, Onfi  Technical aspects: This EEG study was done with scalp electrodes positioned according to the 10-20 International system of electrode placement. Electrical activity was acquired at a sampling rate of _0  and reviewed with a high frequency filter of _1  and a low frequency filter of _2 . EEG data were recorded continuously and digitally stored.  Description:During awake state, no clear posterior dominant rhythm was seen. EEG showed continuous generalized 5 to 6 Hz theta slowing admixed withan excessive amount of 15 to 18 Hz, 2-3 uV beta activity with irregular morphology distributed symmetrically and diffusely. Hyperventilation and photic stimulation were not performed.  Abnormality -Continuous slow, generalized -Excessive beta, generalized    IMPRESSION: This study is suggestive of mild to moderate diffuse encephalopathy, nonspecific to etiology.  No  seizures or epileptiform discharges were seen throughout the recording.  The excessive beta activity seen in the background is most likely due to the effect of benzodiazepine and is a benign EEG pattern.  Aaron Vega  Overnight EEG with video  Result Date: 12/22/2018 Lora Havens, MD     12/23/2018  9:31 AM Patient Name:Aaron Vega DDU:[id number] Epilepsy Attending:Priyanka Barbra Vega Referring Physician/Provider:Dr Kathrynn Speed Duration: 12/21/2018 1231 to 12/22/2018 1231  Patient history:23 year old male with known epilepsy who was noted to have episode of extension of arm with eyes rolling upwards concerning for seizure. EEG to evaluate for seizures.   Level of alertness:Awake, asleep  AEDs during EEG study:Keppra, Onfi  Technical aspects: This EEG study was done with scalp electrodes positioned according to the 10-20 International system of electrode placement. Electrical activity was acquired at a sampling rate of _0  and reviewed with a high frequency filter of _1  and a low frequency filter of _2 . EEG data were recorded continuously and digitally stored.  Description:During awake state, no clear posterior dominant rhythm was seen. Sleep was characterized by vertex waves, sleep spindles (12-_3 ), maximal frontocentral.EEG showed continuous generalized 5 to 6 Hz theta slowing admixed withan excessive amount of 15 to 18 Hz, 2-3 uV beta activity with irregular morphology distributed symmetrically and diffusely. Hyperventilation and photic stimulation were not performed.  Abnormality -Continuous slow, generalized -Excessive beta, generalized   IMPRESSION: This study is suggestive of mild to moderate diffuse encephalopathy, nonspecific to etiology. No seizures or epileptiform discharges were seen throughout the recording.  The excessive beta activity seen in the background is most likely due to the effect of benzodiazepine and is a benign EEG pattern. Aaron Vega   Overnight EEG with video  Result Date: 12/11/2018 Lora Havens, MD     12/22/2018  8:54 AM Patient Name: Aaron Vega MRN: [id number] Epilepsy Attending: Lora Havens Referring Physician/Provider: Dr [name] Duration: 12/10/2018 1946 to 12/11/2018 1218  Patient history: 23 year old male with known epilepsy presented with altered mental status.  EEG to evaluate for seizures.   Level of alertness: Awake, asleep  AEDs during EEG study: Keppra, Onfi  Technical aspects: This EEG study was done with scalp electrodes positioned according to the 10-20 International system of electrode placement. Electrical activity was acquired at a sampling rate of _4  and reviewed with a high frequency filter of _5  and a low frequency filter of _6 . EEG data were recorded continuously and digitally stored.  Description: During awake state, no clear posterior dominant rhythm was seen.  Sleep was characterized by vertex waves, sleep spindles (12 to 14 Hz), maximal frontocentral.  EEG showed continuous generalized 5 to 6 Hz theta slowing admixed with an excessive amount of 15 to 18 Hz, 2-3 uV beta activity with irregular morphology distributed symmetrically and diffusely.  Hyperventilation and photic stimulation were not performed.  Abnormality -Continuous slow, generalized -Excessive beta, generalized     IMPRESSION: This study is suggestive of mild diffuse encephalopathy, nonspecific to etiology. No seizures or epileptiform discharges were seen throughout the recording.  The excessive beta activity seen in the background is most likely due to the effect of benzodiazepine and is a benign EEG pattern.  Lora Havens   ECHOCARDIOGRAM COMPLETE  Result Date: 12/12/2018   ECHOCARDIOGRAM REPORT   Patient Name:   TAI SKELLY Date of Exam: 12/12/2018 Medical Rec #:  [id number]      Height:       71.0 in Accession #:  [id number]     Weight:       298.0 lb Date of Birth:  September 24, 1995       BSA:           2.50 m Patient Age:    23 years       BP:           110/86 mmHg Patient Gender: M              HR:           124 bpm. Exam Location:  Inpatient Procedure: 2D Echo and Intracardiac Opacification Agent Indications:    Abnormal ECG 794.31/R94.31  History:        Patient has no prior history of Echocardiogram examinations.  Sonographer:    Clayton Lefort RDCS (AE) Referring Phys: [id number] [name]  Sonographer Comments: Technically difficult study due to poor echo windows, suboptimal apical window, suboptimal parasternal window and patient is morbidly obese. Image acquisition challenging due to patient body habitus. IMPRESSIONS  1. Left ventricular ejection fraction, by visual estimation, is 55 to 60%. The left ventricle has normal function. There is mildly increased left ventricular hypertrophy.  2. Definity contrast agent was given IV to delineate the left ventricular endocardial borders.  3. The left ventricle has no regional wall motion abnormalities.  4. Indeterminate diastolic filling due to E-A fusion.  5. Global right ventricle has normal systolic function.The right ventricular size is normal. No increase in right ventricular wall thickness.  6. Left atrial size was normal.  7. Right atrial size was normal.  8. The mitral valve is normal in structure. No evidence of mitral valve regurgitation. No evidence of mitral stenosis.  9. The tricuspid valve is normal in structure. Tricuspid valve regurgitation is not demonstrated. 10. The aortic valve is tricuspid. Aortic valve regurgitation is not visualized. No evidence of aortic valve sclerosis or stenosis. 11. TR signal is inadequate for assessing pulmonary artery systolic pressure. 12. Technically difficult study with poor acoustic windows. FINDINGS  Left Ventricle: Left ventricular ejection fraction, by visual estimation, is 55 to 60%. The left ventricle has normal function. Definity contrast agent was given IV to delineate the left ventricular endocardial  borders. The left ventricle has no regional wall motion abnormalities. The left ventricular internal cavity size was the left ventricle is normal in size. There is mildly increased left ventricular hypertrophy. Indeterminate diastolic filling due to E-A fusion. Right Ventricle: The right ventricular size is normal. No increase in right ventricular wall thickness. Global RV systolic function is has normal systolic function. Left Atrium: Left atrial size was normal in size. Right Atrium: Right atrial size was normal in size Pericardium: There is no evidence of pericardial effusion. Mitral Valve: The mitral valve is normal in structure. No evidence of mitral valve stenosis by observation. No evidence of mitral valve regurgitation. Tricuspid Valve: The tricuspid valve is normal in structure. Tricuspid valve regurgitation is not demonstrated. Aortic Valve: The aortic valve is tricuspid. Aortic valve regurgitation is not visualized. The aortic valve is structurally normal, with no evidence of sclerosis or stenosis. Aortic valve mean gradient measures 5.0 mmHg. Aortic valve peak gradient measures 8.3 mmHg. Aortic valve area, by VTI measures 2.58 cm. Pulmonic Valve: The pulmonic valve was normal in structure. Pulmonic valve regurgitation is not visualized. Aorta: The aortic root is normal in size and structure. Venous: The inferior vena cava was not well visualized. IAS/Shunts: No atrial level shunt detected by color flow Doppler.  LEFT VENTRICLE PLAX 2D  LVIDd:         4.14 cm LVIDs:         3.35 cm LV PW:         1.38 cm LV IVS:        1.32 cm LVOT diam:     2.10 cm LV SV:         30 ml LV SV Index:   11.34 LVOT Area:     3.46 cm  RIGHT VENTRICLE RV Basal diam:  3.34 cm RV S prime:     23.10 cm/s TAPSE (M-mode): 2.8 cm LEFT ATRIUM             Index       RIGHT ATRIUM           Index LA diam:        4.00 cm 1.60 cm/m  RA Area:     18.00 cm LA Vol (A2C):   50.6 ml 20.26 ml/m RA Volume:   49.50 ml  19.82 ml/m LA Vol  (A4C):   56.1 ml 22.46 ml/m LA Biplane Vol: 55.1 ml 22.06 ml/m  AORTIC VALVE AV Area (Vmax):    2.69 cm AV Area (Vmean):   2.33 cm AV Area (VTI):     2.58 cm AV Vmax:           144.00 cm/s AV Vmean:          104.000 cm/s AV VTI:            0.234 m AV Peak Grad:      8.3 mmHg AV Mean Grad:      5.0 mmHg LVOT Vmax:         112.00 cm/s LVOT Vmean:        70.000 cm/s LVOT VTI:          0.174 m LVOT/AV VTI ratio: 0.74  AORTA Ao Root diam: 2.20 cm  SHUNTS Systemic VTI:  0.17 m Systemic Diam: 2.10 cm  Loralie Champagne MD Electronically signed by Loralie Champagne MD Signature Date/Time: 12/12/2018/5:14:36 PM    Final    VAS [place] LOWER EXTREMITY VENOUS (DVT)  Result Date: 12/22/2018  Lower Venous Study Indications: Edema.  Risk Factors: None identified. Limitations: Body habitus and poor ultrasound/tissue interface. Comparison Study: No prior studies. Performing Technologist: Oliver Hum RVT  Examination Guidelines: A complete evaluation includes B-mode imaging, spectral Doppler, color Doppler, and power Doppler as needed of all accessible portions of each vessel. Bilateral testing is considered an integral part of a complete examination. Limited examinations for reoccurring indications may be performed as noted.  +---------+---------------+---------+-----------+----------+--------------+ RIGHT    CompressibilityPhasicitySpontaneityPropertiesThrombus Aging +---------+---------------+---------+-----------+----------+--------------+ CFV      Full           Yes      Yes                                 +---------+---------------+---------+-----------+----------+--------------+ SFJ      Full                                                        +---------+---------------+---------+-----------+----------+--------------+ FV Prox  Full                                                        +---------+---------------+---------+-----------+----------+--------------+  FV Mid   Full                                                         +---------+---------------+---------+-----------+----------+--------------+ FV DistalFull                                                        +---------+---------------+---------+-----------+----------+--------------+ PFV      Full                                                        +---------+---------------+---------+-----------+----------+--------------+ POP      Full           Yes      Yes                                 +---------+---------------+---------+-----------+----------+--------------+ PTV      Full                                                        +---------+---------------+---------+-----------+----------+--------------+ PERO     Full                                                        +---------+---------------+---------+-----------+----------+--------------+   +---------+---------------+---------+-----------+----------+--------------+ LEFT     CompressibilityPhasicitySpontaneityPropertiesThrombus Aging +---------+---------------+---------+-----------+----------+--------------+ CFV      Full           Yes      Yes                                 +---------+---------------+---------+-----------+----------+--------------+ SFJ      Full                                                        +---------+---------------+---------+-----------+----------+--------------+ FV Prox  Full                                                        +---------+---------------+---------+-----------+----------+--------------+ FV Mid   Full                                                        +---------+---------------+---------+-----------+----------+--------------+  FV DistalFull                                                        +---------+---------------+---------+-----------+----------+--------------+ PFV      Full                                                         +---------+---------------+---------+-----------+----------+--------------+ POP      Full           Yes      Yes                                 +---------+---------------+---------+-----------+----------+--------------+ PTV      Full                                                        +---------+---------------+---------+-----------+----------+--------------+ PERO     Full                                                        +---------+---------------+---------+-----------+----------+--------------+     Summary: Right: There is no evidence of deep vein thrombosis in the lower extremity. No cystic structure found in the popliteal fossa. Left: There is no evidence of deep vein thrombosis in the lower extremity. No cystic structure found in the popliteal fossa.  *See table(s) above for measurements and observations. Electronically signed by Curt Jews MD on 12/22/2018 at 9:30:31 AM.    Final    [place] EKG SITE RITE  Result Date: 12/31/2018 If Site Rite image not attached, placement could not be confirmed due to current cardiac rhythm.  DG FL GUIDED LUMBAR PUNCTURE  Result Date: 12/14/2018 CLINICAL DATA:  Lower extremity weakness, decreased PO intake, dehydration, concern for Guillain-Barre syndrome EXAM: DIAGNOSTIC LUMBAR PUNCTURE UNDER FLUOROSCOPIC GUIDANCE FLUOROSCOPY TIME:  Fluoroscopy Time: 72 seconds Radiation Exposure Index (if provided by the fluoroscopic device): 395.4 mGy Number of Acquired Spot Images: 3 PROCEDURE: Informed consent was obtained from the patient prior to the procedure, including potential complications of headache, allergy, and pain. With the patient prone, the lower back was prepped with Betadine. 1% Lidocaine was used for local anesthesia. Lumbar puncture was performed at the L5-S1 level using a 20 gauge needle with return of initially blood tinged, subsequently clear CSF. Extremely low opening pressure, which was not measured. 10.5 ml of CSF were obtained for  laboratory studies. The patient tolerated the procedure well and there were no apparent complications. During the procedure, the patient's NG tube started to withdraw. This was returned to the original position in the gastric cardia and advanced slightly into the mid body of the stomach. IMPRESSION: Successful fluoroscopic guided lumbar puncture at L5-S1. Patient's NG tube readjusted, as described above. Electronically Signed   By: Julian Hy  M.D.   On: 12/14/2018 17:19     Time Spent in minutes  40     Desiree Hane M.D on 01/06/2019 at 4:35 PM  To page go to [email or web address] - password [email or web address]

## 2019-01-07 LAB — CBC
HCT: 33.9 % — ABNORMAL LOW (ref 39.0–52.0)
Hemoglobin: 10.6 g/dL — ABNORMAL LOW (ref 13.0–17.0)
MCH: 27.8 pg (ref 26.0–34.0)
MCHC: 31.3 g/dL (ref 30.0–36.0)
MCV: 89 fL (ref 80.0–100.0)
Platelets: 517 10*3/uL — ABNORMAL HIGH (ref 150–400)
RBC: 3.81 MIL/uL — ABNORMAL LOW (ref 4.22–5.81)
RDW: 17.7 % — ABNORMAL HIGH (ref 11.5–15.5)
WBC: 14.5 10*3/uL — ABNORMAL HIGH (ref 4.0–10.5)
nRBC: 0.1 % (ref 0.0–0.2)

## 2019-01-07 LAB — BASIC METABOLIC PANEL
Anion gap: 11 (ref 5–15)
BUN: 10 mg/dL (ref 6–20)
CO2: 28 mmol/L (ref 22–32)
Calcium: 9.7 mg/dL (ref 8.9–10.3)
Chloride: 99 mmol/L (ref 98–111)
Creatinine, Ser: <0.3 mg/dL — ABNORMAL LOW (ref 0.61–1.24)
Glucose, Bld: 105 mg/dL — ABNORMAL HIGH (ref 70–99)
Potassium: 4.1 mmol/L (ref 3.5–5.1)
Sodium: 138 mmol/L (ref 135–145)

## 2019-01-07 LAB — CULTURE, BLOOD (ROUTINE X 2)
Culture: NO GROWTH
Culture: NO GROWTH
Special Requests: ADEQUATE

## 2019-01-07 LAB — GLUCOSE, CAPILLARY
Glucose-Capillary: 98 mg/dL (ref 70–99)
Glucose-Capillary: 101 mg/dL — ABNORMAL HIGH (ref 70–99)
Glucose-Capillary: 100 mg/dL — ABNORMAL HIGH (ref 70–99)

## 2019-01-07 MED ORDER — SODIUM CHLORIDE 0.9 % IV BOLUS
500.0000 mL | Freq: Once | INTRAVENOUS | Status: AC
  Administered 2019-01-07: 500 mL via INTRAVENOUS

## 2019-01-07 NOTE — Progress Notes (Signed)
[facility]  PROGRESS NOTE  Aaron Vega WUJ:[id number] DOB: 1995-03-03 DOA: 11/26/2018 PCP: [facility] Admit date - 12/20/2018   Admitting Physician Assunta Found, MD  Outpatient Primary MD for the patient is [facility] [place]  LOS - 34 Brief Narrative    23 year old autistic male with autism and known epilepsy followed by [facility] and nonverbal at baseline, with marked cognitive decline and gait dysfunction since head injury after seizure on 08/2018 attributed to hypoxic ischemic encephalopathy following prolonged seizure. Prior to that he had a much better quality of life in terms of independence with ADLs. He presented on 12/03/2018 with severe obstipation, lethargy and vomiting with decreased urinary output.   Admitted with working diagnosis of subacute shock with lactic acidosis from aspiration pneumonia, metabolic encephalopathy and ileus.  Hospital course complicated by  -persistent lactic acidosis -Wernicke's encephalopathy in setting of severe thiamine deficiency, confirmed on MRI 11/16 (given rapid decline in cognition and gait in addition to new onset of peripheral neuropathic symptoms since the onset of intractable N/V) -Adynamic ileus and hepatic steatosis, 11/28 on Ct abd/pelvis -GBS --given distal weaknesstreated with IVIG.   -Intubation for respiratory failure in setting of sepsis and inability to clear secretions (11/27 intubation, extubated 11/30) -Pressor support for hypotension, off on 11/30 -Frequent need for deep suctioning due to inability to clear secretions   Subjective  Aaron Vega today had no acute events overnight.  Remains non-interactive for me. Sleeping. Mom at bedside A & P   Goals of care. Has continued goals of care discussions with mom.  Patient was briefly made hospice care after last hospitalization around 11/10.  Patient presents due to worsening mental status and decreased p.o.  intake per mother. -Aaron Vega's mother and I have discussed daily his poor prognosis and low likelihood of meaningful recovery to his previous neurologic baseline -Despite IVIG, vitamin sedimentation, treatment reversible conditions, nutritional support Aaron Vega has remained essentially unresponsive and this may be his new baseline -She will discuss with his father, currently considering comfort care -Appreciate Palliative care support -Continue goals of care discussions with mother  Hypotension, resolved.  Related to dehydration.  Improved with small bolus on 12/13.  Records are unremarkable.  Hemoglobin stable, white count improved.  Chest ray unremarkable.  Tolerating tube feeds.  Sinus tachycardia, stable.  Had acute worsening of syncope hypotension on 12/13, now back to previous baseline in the low 100s.EKG confirmed sinus.  Likely reflex tachycardia related to hypotension TSH was 6 improved from 10 prior -Closely monitor, continue current beta-blocker  Acute on chronic hypoxemic respiratory failure, stable. Inability to clear secretions due to poor mentation, multifactorial neurologic weakness.  Completed treatment for aspiration pneumonia. Hx of OSA, intolerant of CPAP.  -Last evaluated by PCCM on 12/7 and very high risk for reintubation -Mother elects for DNI  -monitor in SDU, requiring frequent deep suctioning and Chest PT -high aspiration risk, NPO  Aspiration pneumonia.  Sepsis physiology resolved.  Blood cultures remain negative.  Afebrile over last 24 hours.  Completed previous course of cefepime, Flagyl (11/14-7/18, Unasyn 11/28-12/2) recent chest x-ray shows no overt opacities -Repeat blood cultures unremarkable so far -Completed 5 day course of Unasyn (restarted on 12/7-12/12) -Continue aspiration precautions as above -WBC stable  Hypokalemia, improving.  On daily potassium here but missed dose yesterday while holding use of newly inserted PEG tube -Check mag, continue 40 mEq  daily potassium, add additional 40 mEq x 1 -Monitor BMP daily  Leukocytosis ,stable. Afebrile overnight. Repeat CXR shows  no opacities. Unclear source of infection given no diarrhea, no change in abdominal exam and tolerating tube feeds ok. 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  Has had progressive weakness for several months with acute worsening in extension weakness starting month prior.  Secondary to GBS + Wernicke's encephalopathy/B1 deficiency. Neurology consulted.Distal weakness began FIRST, followed by the nausea and vomiting. This, coupled with the elevated CSF protein, makes GBS the more likely cuprit, but his B1 was low at 28 on admission as well, keeping this in the differential. N - Completed 5 day IVIG course and high dose thiamine -continue thiamine 100 mg daily and multivitamin -neurology recommends supportive care  Epilepsy history,stable. EEG neg for epileptic activity.  -Continue keppra and onfi, sz precautions -followed by [facility] -[facility] follow up provided per family request on discharge  Adynamic Ileus. Resolved on Abd xr. GI assisted with care. No long on magnesuim and erythromycin - maintain normal K and mg  Hypothyroidism, stable -Continue Synthroid  Hypertension, with lower BP as mentioned above -We will hold lisinopril until blood pressure improves  Severe malnutrition in context of acute illness/injury. -Continue tube feeds now at goal rate -Continue supplementing feeds   Family Communication  :  Mom updated at bedside  Code Status :  Partial/Full scope/Do not intubate  Disposition Plan  :  Close monitoring progressive unit for deep suctioning for respiratory status, tachycardia,  monitor neuro status. Very guarded prognosis  Consults  : Neurology,  GI, PCCM, palliative  Procedures  :   10/16 NG tube placed per interventional radiology 11/15 Midline > 11/20 lumbar puncture 11/27 endotracheal intubation > 11/30 11/27 central line placement, right IJ > 12/1*  DVT Prophylaxis  :  Lovenox   Lab Results  Component Value Date   PLT 517 (H) 01/07/2019    Diet :  Diet Order            Diet NPO time specified  Diet effective now               Inpatient Medications Scheduled Meds: . chlorhexidine gluconate (MEDLINE KIT)  15 mL Mouth Rinse BID  . Chlorhexidine Gluconate Cloth  6 each Topical Q0600  . cloBAZam  60 mg Per Tube BID  . enoxaparin (LOVENOX) injection  40 mg Subcutaneous Q12H  . feeding supplement (PRO-STAT SUGAR FREE 64)  60 mL Per Tube BID  . levothyroxine  25 mcg Per Tube Q0600  . lisinopril  40 mg Per NG tube Daily  . mouth rinse  15 mL Mouth Rinse q12n4p  . metoprolol tartrate  125 mg Per NG tube BID  . multivitamin  15 mL Per Tube Daily  . nystatin   Topical Daily  . pantoprazole sodium  40 mg Per Tube BID  . potassium chloride  40 mEq Per Tube Daily  . sodium chloride flush  10-40 mL Intracatheter Q12H  . sodium chloride flush  10-40 mL Intracatheter Q12H  . tamsulosin  0.4 mg Oral Daily  . thiamine injection  100 mg Intravenous Daily  . vitamin B-12  1,000 mcg Per Tube Daily   Continuous Infusions: . feeding supplement (VITAL 1.5 CAL) 1,000 mL (01/07/19 0211)  . levETIRAcetam 1,000 mg (01/07/19 1008)   PRN Meds:.acetaminophen (TYLENOL) oral liquid 160 mg/5 mL, fentaNYL (SUBLIMAZE) injection, ibuprofen, ipratropium-albuterol, labetalol, lip balm,  LORazepam, metoprolol tartrate, ondansetron (ZOFRAN) IV, sodium chloride flush, sodium chloride flush, white petrolatum  Antibiotics  :   Anti-infectives (From admission, onward)   Start     Dose/Rate Route Frequency Ordered Stop   12/31/18 1100  Ampicillin-Sulbactam (UNASYN) 3 g in sodium chloride 0.9 % 100 mL IVPB     3 g 200 mL/hr over 30 Minutes  Intravenous Every 6 hours 12/31/18 1000 01/05/19 0645   12/22/18 0900  ampicillin-sulbactam (UNASYN) 1.5 g in sodium chloride 0.9 % 100 mL IVPB  Status:  Discontinued     1.5 g 200 mL/hr over 30 Minutes Intravenous Every 6 hours 12/22/18 0851 12/22/18 0857   12/22/18 0900  Ampicillin-Sulbactam (UNASYN) 3 g in sodium chloride 0.9 % 100 mL IVPB     3 g 200 mL/hr over 30 Minutes Intravenous Every 6 hours 12/22/18 0857 12/26/18 2105   12/19/18 1400  erythromycin (EES) 400 MG/5ML suspension 500 mg  Status:  Discontinued     500 mg Per Tube Every 8 hours 12/19/18 1132 12/30/18 1013   12/18/18 1500  erythromycin 500 mg in sodium chloride 0.9 % 100 mL IVPB  Status:  Discontinued     500 mg 100 mL/hr over 60 Minutes Intravenous Every 8 hours 12/18/18 1449 12/19/18 1132   12/15/18 1400  erythromycin (EES) 400 MG/5ML suspension 400 mg  Status:  Discontinued     400 mg Per Tube Every 8 hours 12/15/18 1012 12/15/18 1130   12/15/18 1400  erythromycin 500 mg in sodium chloride 0.9 % 100 mL IVPB  Status:  Discontinued     500 mg 100 mL/hr over 60 Minutes Intravenous Every 8 hours 12/15/18 1130 12/18/18 1449   12/09/18 0600  vancomycin (VANCOCIN) 1,250 mg in sodium chloride 0.9 % 250 mL IVPB  Status:  Discontinued     1,250 mg 166.7 mL/hr over 90 Minutes Intravenous Every 12 hours 11/30/2018 1539 12/10/18 1043   12/09/18 0000  ceFEPIme (MAXIPIME) 2 g in sodium chloride 0.9 % 100 mL IVPB  Status:  Discontinued     2 g 200 mL/hr over 30 Minutes Intravenous Every 8 hours 12/02/2018 1539 12/11/18 1801   12/09/18 0000  metroNIDAZOLE (FLAGYL) IVPB 500 mg  Status:  Discontinued     500 mg 100 mL/hr over 60 Minutes Intravenous Every 8 hours 12/13/2018 2146 12/11/18 1801   12/01/2018 1415  ceFEPIme (MAXIPIME) 2 g in sodium chloride 0.9 % 100 mL IVPB     2 g 200 mL/hr over 30 Minutes Intravenous  Once 12/24/2018 1411 12/01/2018 1538   12/11/2018 1415  metroNIDAZOLE (FLAGYL) IVPB 500 mg     500 mg 100 mL/hr over 60 Minutes  Intravenous  Once 12/21/2018 1411 12/24/2018 1609   11/29/2018 1415  vancomycin (VANCOCIN) IVPB 1000 mg/200 mL premix  Status:  Discontinued     1,000 mg 200 mL/hr over 60 Minutes Intravenous  Once 12/03/2018 1411 12/04/2018 1414   12/19/2018 1415  vancomycin (VANCOCIN) 2,500 mg in sodium chloride 0.9 % 500 mL IVPB     2,500 mg 250 mL/hr over 120 Minutes Intravenous  Once 12/19/2018 1414 11/28/2018 1710       Objective   Vitals:   01/07/19 0426 01/07/19 0500 01/07/19 0740 01/07/19 0853  BP: 130/74  129/87   Pulse: 95   97  Resp: '18  20 19  '$ Temp: 98.3 F (36.8 C)  98.4 F (36.9 C)   TempSrc: Oral  Oral   SpO2: 99%     Weight:  Marland Kitchen)  142.1 kg    Height:        SpO2: 99 % O2 Flow Rate (L/min): 2 L/min FiO2 (%): 28 %  Wt Readings from Last 3 Encounters:  01/07/19 (!) 142.1 kg  11/21/18 (!) 148.8 kg  11/08/18 (!) 148.8 kg     Intake/Output Summary (Last 24 hours) at 01/07/2019 1106 Last data filed at 01/07/2019 1015 Gross per 24 hour  Intake 705 ml  Output 3725 ml  Net -3020 ml    Physical Exam:  Obese, young male, sleeping during exam Does not follow commands (chronic), occasionally moves upper extremities against gravity, persistent weakness distally in all extremities, intermittently winces to noxious stimuli Diffuse rhonchi in upper airways, without respiratory distress, occasionally tries to cough with poor effort No new F.N deficits,  [place].AT, Tachycardic, No Gallops,Rubs or new Murmurs,  Abdominal binder in place, diminished bowel sounds, PEG tube in place  I have personally reviewed the following:   Data Reviewed:  CBC Recent Labs  Lab 01/03/19 0817 01/04/19 0640 01/05/19 0510 01/06/19 0440 01/07/19 0500  WBC 9.5 9.8 13.2* 12.7* 14.5*  HGB 9.9* 10.1* 10.3* 9.9* 10.6*  HCT 32.2* 32.6* 33.3* 32.3* 33.9*  PLT 413* 409* 451* 459* 517*  MCV 91.7 91.1 92.0 92.0 89.0  MCH 28.2 28.2 28.5 28.2 27.8  MCHC 30.7 31.0 30.9 30.7 31.3  RDW 17.0* 17.2* 17.3* 17.5* 17.7*     Chemistries  Recent Labs  Lab 01/01/19 0641 01/03/19 0817 01/04/19 0640 01/05/19 1425 01/06/19 0440 01/07/19 0500  NA 144 144 143 138 139 138  K 3.4* 2.8* 3.8 4.4 4.4 4.1  CL 105 106 106 101 100 99  CO2 '27 28 28 29 26 28  '$ GLUCOSE 97 106* 103* 106* 111* 105*  BUN '12 10 12 14 13 10  '$ CREATININE 0.40* <0.30* 0.42* <0.30* <0.30* <0.30*  CALCIUM 9.4 9.2 9.2 9.4 9.2 9.7  MG 2.2 1.8  --   --   --   --    ------------------------------------------------------------------------------------------------------------------ No results for input(s): CHOL, HDL, LDLCALC, TRIG, CHOLHDL, LDLDIRECT in the last 72 hours.  No results found for: HGBA1C ------------------------------------------------------------------------------------------------------------------ Recent Labs    01/06/19 1451  TSH 6.251*   ------------------------------------------------------------------------------------------------------------------ No results for input(s): VITAMINB12, FOLATE, FERRITIN, TIBC, IRON, RETICCTPCT in the last 72 hours.  Coagulation profile No results for input(s): INR, PROTIME in the last 168 hours.  No results for input(s): DDIMER in the last 72 hours.  Cardiac Enzymes No results for input(s): CKMB, TROPONINI, MYOGLOBIN in the last 168 hours.  Invalid input(s): CK ------------------------------------------------------------------------------------------------------------------    Component Value Date/Time   BNP 67.3 12/10/2018 0930    Micro Results Recent Results (from the past 240 hour(s))  C difficile quick scan w PCR reflex     Status: None   Collection Time: 12/30/18 11:50 AM   Specimen: STOOL  Result Value Ref Range Status   C Diff antigen NEGATIVE NEGATIVE Final   C Diff toxin NEGATIVE NEGATIVE Final   C Diff interpretation No C. difficile detected.  Final    Comment: Performed at [facility], [place] [address] [place], [place] [address]  Culture, blood (routine x 2)      Status: None   Collection Time: 01/02/19  9:03 AM   Specimen: BLOOD LEFT HAND  Result Value Ref Range Status   Specimen Description BLOOD LEFT HAND  Final   Special Requests   Final    BOTTLES DRAWN AEROBIC ONLY Blood Culture results may not be optimal  due to an inadequate volume of blood received in culture bottles   Culture   Final    NO GROWTH 5 DAYS Performed at [facility], [place] [address], [place], [place] [address]    Report Status 01/07/2019 FINAL  Final  Culture, blood (routine x 2)     Status: None   Collection Time: 01/02/19  1:29 PM   Specimen: BLOOD  Result Value Ref Range Status   Specimen Description BLOOD LEFT FOOT  Final   Special Requests   Final    BOTTLES DRAWN AEROBIC ONLY Blood Culture adequate volume   Culture   Final    NO GROWTH 5 DAYS Performed at [facility], [place] [address], [place], [place] [address]    Report Status 01/07/2019 FINAL  Final    Radiology Reports DG Abd 1 View  Result Date: 12/30/2018 CLINICAL DATA:  Reason for exam: Vomiting, aspiration into airway Hx HTN, pre-diabetes, fatty liver, pancreatitis, pneumonia EXAM: ABDOMEN - 1 VIEW COMPARISON:  12/25/2018 FINDINGS: Feeding tube tip in the distal duodenum. Stomach appears decompressed. Multiple dilated small bowel loops in the mid abdomen, increased since previous. Scattered gas in the nondilated colon. CLINICAL DATA:  Reason for exam: Vomiting, aspiration into airway Hx HTN, pre-diabetes, fatty liver, pancreatitis, pneumonia EXAM: ABDOMEN - 1 VIEW COMPARISON:  12/25/2018 FINDINGS: Feeding tube tip in the distal duodenum. Stomach appears decompressed. Multiple dilated small bowel loops in the mid abdomen, increased since previous. Scattered gas in the nondilated colon. IMPRESSION: 1. Multiple dilated small bowel loops in the mid abdomen, increased since previous, suggesting obstruction or developing ileus. 2. Feeding tube tip in the distal duodenum.  Stomach  decompressed.  Electronically Signed   By: Lucrezia Europe M.D.   On: 12/30/2018 09:43   DG Abd 1 View  Result Date: 12/24/2018 CLINICAL DATA:  Nasogastric tube placement. EXAM: ABDOMEN - 1 VIEW COMPARISON:  December 21, 2018. FINDINGS: The bowel gas pattern is normal. Nasogastric tube tip is seen in expected position of third portion of duodenum. No radio-opaque calculi or other significant radiographic abnormality are seen. IMPRESSION: Nasogastric tube tip seen in expected position of third portion of duodenum. No evidence of bowel obstruction or ileus. Electronically Signed   By: Marijo Conception M.D.   On: 12/24/2018 14:39   DG Abd 1 View  Result Date: 12/16/2018 CLINICAL DATA:  Ileus. EXAM: ABDOMEN - 1 VIEW COMPARISON:  None. FINDINGS: There is a dilated loop of small bowel in the mid abdomen measuring approximately 3.6 cm. There is no obvious pneumatosis or free air. The patient's enteric tube is outside the field of view. IMPRESSION: Mildly dilated loop of small bowel in the mid abdomen is nonspecific and could represent an ileus or partial small bowel obstruction the appropriate clinical setting. Electronically Signed   By: Constance Holster M.D.   On: 12/16/2018 15:04   DG Abd 1 View  Result Date: 12/14/2018 CLINICAL DATA:  Nasogastric tube advancement. EXAM: ABDOMEN - 1 VIEW COMPARISON:  One view abdomen 12/12/2018 and 12/10/2018. CT 12/18/2018. FINDINGS: 0936 hours. Two views are submitted. The tip of the endotracheal tube is unchanged, overlying the mid stomach. The stomach and colon are mildly distended with gas. No significant small bowel distention identified. There is no evidence of free intraperitoneal air. Atelectasis is present at both lung bases. IMPRESSION: Stable location of the nasogastric tube. No evidence of bowel obstruction. Electronically Signed   By: Richardean Sale M.D.   On: 12/14/2018 09:53  DG Abd 1 View  Result Date: 12/10/2018 CLINICAL DATA:  23 year old male status post NG tube  placement. EXAM: ABDOMEN - 1 VIEW COMPARISON:  Abdominal radiograph dated 12/10/2018. FINDINGS: Partially visualized enteric tube with tip and side-port over the gastric air. The osseous structures and soft tissues appear unremarkable. IMPRESSION: Enteric tube with tip and side-port over the gastric air. Electronically Signed   By: Anner Crete M.D.   On: 12/10/2018 14:41   CT Head Wo Contrast  Result Date: 12/22/2018 CLINICAL DATA:  Altered level of consciousness EXAM: CT HEAD WITHOUT CONTRAST TECHNIQUE: Contiguous axial images were obtained from the base of the skull through the vertex without intravenous contrast. COMPARISON:  11/03/2018 FINDINGS: Brain: The brainstem, cerebellum, cerebral peduncles, thalami, basal ganglia, basilar cisterns, and ventricular system appear within normal limits. No intracranial hemorrhage, mass lesion, or acute CVA. Vascular: Unremarkable Skull: Unremarkable Sinuses/Orbits: Mild chronic right frontal sinusitis. Other: No supplemental non-categorized findings. IMPRESSION: 1. No significant intracranial abnormality is observed. 2. Mild chronic right frontal sinusitis. Electronically Signed   By: Van Clines M.D.   On: 12/09/2018 19:02   CT THORACIC SPINE WO CONTRAST  Result Date: 12/14/2018 CLINICAL DATA:  Myelopathy, acute or progressive EXAM: CT THORACIC SPINE WITHOUT CONTRAST TECHNIQUE: Multidetector CT images of the thoracic were obtained using the standard protocol without intravenous contrast. COMPARISON:  None. FINDINGS: Alignment: Normal Vertebrae: Normal vertebra.  No fracture or mass. Paraspinal and other soft tissues: Negative for paraspinous mass or adenopathy. NG tube in place Left lower lobe small infiltrate most likely pneumonia. Fatty liver with hepatomegaly. Disc levels: Disc spaces are well preserved. No significant disc degeneration or spurring. Negative for spinal stenosis. IMPRESSION: Negative CT thoracic spine Left lower lobe infiltrate,  probable pneumonia Electronically Signed   By: Franchot Gallo M.D.   On: 12/14/2018 20:48   CT LUMBAR SPINE WO CONTRAST  Result Date: 12/14/2018 CLINICAL DATA:  Myelopathy, acute or progressive. EXAM: CT LUMBAR SPINE WITHOUT CONTRAST TECHNIQUE: Multidetector CT imaging of the lumbar spine was performed without intravenous contrast administration. Multiplanar CT image reconstructions were also generated. COMPARISON:  None. FINDINGS: Segmentation: Normal Alignment: Normal Vertebrae: Negative for fracture or mass.  Normal vertebra. Paraspinal and other soft tissues: Negative for paraspinous mass or adenopathy. No soft tissue edema. Disc levels: Disc spaces maintained. No significant disc degeneration or spinal stenosis. IMPRESSION: Negative CT lumbar spine. Electronically Signed   By: Franchot Gallo M.D.   On: 12/14/2018 20:51   MR BRAIN W WO CONTRAST  Result Date: 12/10/2018 CLINICAL DATA:  Slowly progressive ataxia.  History of seizures. EXAM: MRI HEAD WITHOUT AND WITH CONTRAST TECHNIQUE: Multiplanar, multiecho pulse sequences of the brain and surrounding structures were obtained without and with intravenous contrast. CONTRAST:  55m GADAVIST GADOBUTROL 1 MMOL/ML IV SOLN COMPARISON:  Head CT 12/14/2018 FINDINGS: Brain: There is considerable motion degradation. There is abnormal T2 signal and restricted diffusion in both medial thalami. The most likely explanation is Wernicke encephalopathy. The remainder of the brain appears normal. No evidence of ischemic infarction, mass lesion, hemorrhage, hydrocephalus or extra-axial collection. No abnormal contrast enhancement is demonstrated. Vascular: Major vessels at the base of the brain show flow. Skull and upper cervical spine: Negative Sinuses/Orbits: Clear/normal Other: None IMPRESSION: Abnormal T2 signal and restricted diffusion in both medial thalami. 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 Electronically Signed   By: MNelson ChimesM.D.   On:  12/10/2018 17:16   MR CERVICAL SPINE W WO CONTRAST  Result Date: 12/10/2018 CLINICAL DATA:  Slowly progressive ataxia and seizures. EXAM: MRI CERVICAL SPINE WITHOUT AND WITH CONTRAST TECHNIQUE: Multiplanar and multiecho pulse sequences of the cervical spine, to include the craniocervical junction and cervicothoracic junction, were obtained without and with intravenous contrast. CONTRAST:  38m GADAVIST GADOBUTROL 1 MMOL/ML IV SOLN COMPARISON:  None. FINDINGS: The study suffers from considerable motion degradation. Alignment: Normal Vertebrae: Normal Cord: Normal Posterior Fossa, vertebral arteries, paraspinal tissues: See results of brain MRI. Paraspinal regions negative. Disc levels: Normal IMPRESSION: Motion degraded exam.  No abnormality seen in the cervical region. Electronically Signed   By: MNelson ChimesM.D.   On: 12/10/2018 17:20   MR THORACIC SPINE W WO CONTRAST  Result Date: 12/19/2018 CLINICAL DATA:  Quadriplegia EXAM: MRI THORACIC AND LUMBAR SPINE WITHOUT AND WITH CONTRAST TECHNIQUE: Multiplanar and multiecho pulse sequences of the thoracic and lumbar spine were obtained without and with intravenous contrast. CONTRAST:  162mGADAVIST GADOBUTROL 1 MMOL/ML IV SOLN COMPARISON:  CT thoracic lumbar 12/14/2018 FINDINGS: MRI THORACIC SPINE FINDINGS Alignment:  Normal. Image quality degraded by moderate to extensive motion. Vertebrae: Negative for fracture or mass. Normal enhancement of the spine. Cord: Limited cord evaluation due to motion. No cord lesion or cord compression identified. Paraspinal and other soft tissues: Negative Disc levels: Negative for disc protrusion or spinal stenosis. MRI LUMBAR SPINE FINDINGS Segmentation:  Normal Motion degraded study. There is progressive motion on the study which becomes more severe on the axial images. Alignment:  Normal Vertebrae:  Negative for fracture or mass. Conus medullaris and cauda equina: Conus extends to the T12-L1 level. Conus and cauda equina  appear normal. Paraspinal and other soft tissues: Negative for paraspinous mass or adenopathy. Normal soft tissue enhancement. Disc levels: No significant abnormality at L3-4 or above Mild disc bulging and mild facet degeneration L4-5 and L5-S1. Negative for disc protrusion or stenosis IMPRESSION: Motion degraded study, worse on the thoracic compared to the lumbar spine. No acute thoracic abnormality. Negative for fracture, infection, or spinal stenosis. No acute lumbar abnormality. Mild disc and facet degeneration L4-5 and L5-S1 without stenosis. Electronically Signed   By: ChFranchot Gallo.D.   On: 12/19/2018 12:05   MR Lumbar Spine W Wo Contrast  Result Date: 12/19/2018 CLINICAL DATA:  Quadriplegia EXAM: MRI THORACIC AND LUMBAR SPINE WITHOUT AND WITH CONTRAST TECHNIQUE: Multiplanar and multiecho pulse sequences of the thoracic and lumbar spine were obtained without and with intravenous contrast. CONTRAST:  1074mADAVIST GADOBUTROL 1 MMOL/ML IV SOLN COMPARISON:  CT thoracic lumbar 12/14/2018 FINDINGS: MRI THORACIC SPINE FINDINGS Alignment:  Normal. Image quality degraded by moderate to extensive motion. Vertebrae: Negative for fracture or mass. Normal enhancement of the spine. Cord: Limited cord evaluation due to motion. No cord lesion or cord compression identified. Paraspinal and other soft tissues: Negative Disc levels: Negative for disc protrusion or spinal stenosis. MRI LUMBAR SPINE FINDINGS Segmentation:  Normal Motion degraded study. There is progressive motion on the study which becomes more severe on the axial images. Alignment:  Normal Vertebrae:  Negative for fracture or mass. Conus medullaris and cauda equina: Conus extends to the T12-L1 level. Conus and cauda equina appear normal. Paraspinal and other soft tissues: Negative for paraspinous mass or adenopathy. Normal soft tissue enhancement. Disc levels: No significant abnormality at L3-4 or above Mild disc bulging and mild facet degeneration L4-5  and L5-S1. Negative for disc protrusion or stenosis IMPRESSION: Motion degraded study, worse on the thoracic compared to the lumbar spine. No acute thoracic abnormality. Negative for fracture, infection, or spinal  stenosis. No acute lumbar abnormality. Mild disc and facet degeneration L4-5 and L5-S1 without stenosis. Electronically Signed   By: Franchot Gallo M.D.   On: 12/19/2018 12:05   CT ABDOMEN PELVIS W CONTRAST  Result Date: 12/22/2018 CLINICAL DATA:  Persistent ileus. EXAM: CT ABDOMEN AND PELVIS WITH CONTRAST TECHNIQUE: Multidetector CT imaging of the abdomen and pelvis was performed using the standard protocol following bolus administration of intravenous contrast. CONTRAST:  163m OMNIPAQUE IOHEXOL 300 MG/ML  SOLN COMPARISON:  12/09/2018 FINDINGS: Lower chest: Normal heart size. Airspace disease in both lower lungs, most confluent in the medial left lower lobe. Hepatobiliary: Hepatic steatosis which is diffuse and prominent.No evidence of biliary obstruction or stone. Pancreas: Unremarkable. Spleen: Unremarkable. Adrenals/Urinary Tract: Negative adrenals. No hydronephrosis or stone. Bladder is decompressed by Foley catheter. Stomach/Bowel: Fluid levels seen throughout small and large bowel with moderate distention of bowel loops. A rectal tube is in place. Maximal colonic thickening is at the ascending segment-8.4 cm. No bowel wall thickening. New high-density within the appendix attributed to barium retention. There is mild widening of the midportion appendix that is stable, no periappendiceal inflammation. Enteric tube tip in good position at the proximal stomach. Vascular/Lymphatic: No acute vascular abnormality. No mass or adenopathy. Reproductive:Negative Other: No ascites or pneumoperitoneum. Musculoskeletal: No acute abnormalities. IMPRESSION: 1. Adynamic ileus pattern that has progressed from 12/07/2018. 2. Hepatic steatosis. 3. Pneumonia both lung bases, progressed from comparison.  Electronically Signed   By: JMonte FantasiaM.D.   On: 12/22/2018 15:42   CT ABDOMEN PELVIS W CONTRAST  Result Date: 12/04/2018 CLINICAL DATA:  Abdominal pain. Shortness of breath. EXAM: CT ABDOMEN AND PELVIS WITH CONTRAST TECHNIQUE: Multidetector CT imaging of the abdomen and pelvis was performed using the standard protocol following bolus administration of intravenous contrast. CONTRAST:  1269mOMNIPAQUE IOHEXOL 350 MG/ML SOLN COMPARISON:  11/24/2018 FINDINGS: Lower chest: New airspace opacity in the left lower lobe suspicious for pneumonia. Air fluid level in the distal esophagus probably from reflux. Hepatobiliary: Diffuse hepatic steatosis is suspected. Otherwise unremarkable. Pancreas: Unremarkable Spleen: Unremarkable Adrenals/Urinary Tract: Unremarkable Stomach/Bowel: There are a few mildly dilated loops of small bowel measuring up to 3.5 cm in diameter, with scattered air-fluid levels but without an obvious transition point or site of obstruction. Vascular/Lymphatic: Borderline prominent right gastric node 0.9 cm in short axis on image 24/3, previously 0.8 cm. Patient has an unusually small in caliber abdominal aorta, measuring 0.9 cm in diameter. Common origin of the celiac trunk and SMA. No obvious periaortic inflammatory findings. Reproductive: Unremarkable Other: No supplemental non-categorized findings. Musculoskeletal: Unremarkable IMPRESSION: 1. New airspace opacity in the left lower lobe suspicious for pneumonia. 2. There are a few mildly dilated loops of small bowel with scattered air-fluid levels but without an obvious transition point or site of obstruction. Ileus is favored over low-grade partial small bowel obstruction. 3. Diffuse hepatic steatosis. 4. Air fluid level in the distal esophagus probably from reflux. 5. Small caliber abdominal aorta, only about 0.9 cm in diameter, without periaortic inflammatory findings. 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 Consider follow up  vascular surgical consultation. Electronically Signed   By: WaVan Clines.D.   On: 12/07/2018 19:15   IR GASTROSTOMY TUBE MOD SED  Result Date: 01/02/2019 INDICATION: Dysphagia. Please place percutaneous gastrostomy tube for enteric nutrition supplementation purposes. EXAM: PULL TROUGH GASTROSTOMY TUBE PLACEMENT COMPARISON:  CT abdomen pelvis-12/22/2018 MEDICATIONS: Patient is currently admitted to the hospital and receiving intravenous antibiotics; Antibiotics were administered within 1 hour of the  procedure. Glucagon 1 mg IV CONTRAST:  30 mL of Isovue-300 administered into the gastric lumen. ANESTHESIA/SEDATION: Moderate (conscious) sedation was employed during this procedure. A total of Versed 1 mg and Fentanyl 50 mcg was administered intravenously. Moderate Sedation Time: 10 minutes. The patient's level of consciousness and vital signs were monitored continuously by radiology nursing throughout the procedure under my direct supervision. FLUOROSCOPY TIME:  3 minutes, 30 seconds (89 mGy) COMPLICATIONS: None immediate. PROCEDURE: Informed written consent was obtained from the patient following explanation of the procedure, risks, benefits and alternatives. A time out was performed prior to the initiation of the procedure. Ultrasound scanning was performed to demarcate the edge of the left lobe of the liver. Maximal barrier sterile technique utilized including caps, mask, sterile gowns, sterile gloves, large sterile drape, hand hygiene and Betadine prep. The left upper quadrant was sterilely prepped and draped. An oral gastric catheter was inserted into the stomach under fluoroscopy. The existing nasogastric feeding tube was removed. The left costal margin and barium/air opacified transverse colon were identified and avoided. Air was injected into the stomach for insufflation and visualization under fluoroscopy. Under sterile conditions a 17 gauge trocar needle was utilized to access the stomach  percutaneously beneath the left subcostal margin after the overlying soft tissues were anesthetized with 1% Lidocaine with epinephrine. Needle position was confirmed within the stomach with aspiration of air and injection of small amount of contrast. A single T tack was deployed for gastropexy. Over an Amplatz guide wire, a 9-French sheath was inserted into the stomach. A snare device was utilized to capture the oral gastric catheter. The snare device was pulled retrograde from the stomach up the esophagus and out the oropharynx. The 20-French pull-through gastrostomy was connected to the snare device and pulled antegrade through the oropharynx down the esophagus into the stomach and then through the percutaneous tract external to the patient. The gastrostomy was assembled externally. Contrast injection confirms position in the stomach. Several spot radiographic images were obtained in various obliquities for documentation. The patient tolerated procedure well without immediate post procedural complication. FINDINGS: After successful fluoroscopic guided placement, the gastrostomy tube is appropriately positioned with internal disc against the ventral aspect of the gastric lumen. IMPRESSION: Successful fluoroscopic insertion of a 20-French pull-through gastrostomy tube. The gastrostomy may be used immediately for medication administration and in 24 hrs for the initiation of feeds. Electronically Signed   By: Sandi Mariscal M.D.   On: 01/02/2019 10:14   DG CHEST PORT 1 VIEW  Result Date: 01/05/2019 CLINICAL DATA:  Hypoxia, fever. EXAM: PORTABLE CHEST 1 VIEW COMPARISON:  December 31, 2018. FINDINGS: The heart size and mediastinal contours are within normal limits. Both lungs are clear. Right-sided PICC line is noted with tip in expected position of right atrium. No pneumothorax or pleural effusion is noted. The visualized skeletal structures are unremarkable. IMPRESSION: No active disease. Electronically Signed   By:  Marijo Conception M.D.   On: 01/05/2019 11:25   DG CHEST PORT 1 VIEW  Result Date: 12/31/2018 CLINICAL DATA:  23 year old male with a history aspiration EXAM: PORTABLE CHEST 1 VIEW COMPARISON:  12/30/2018, 12/28/2018 FINDINGS: Cardiomediastinal silhouette unchanged in size and contour. Low lung volumes persist with linear opacities at the lung bases. No pneumothorax or large pleural effusion. Crowding of the interstitium and the vasculature. Enteric tube projects over the mediastinum and terminates out of the field of view. IMPRESSION: Unchanged appearance of the chest x-ray with low lung volumes and likely atelectasis. Unchanged enteric  feeding tube. Electronically Signed   By: Corrie Mckusick D.O.   On: 12/31/2018 07:45   DG CHEST PORT 1 VIEW  Result Date: 12/30/2018 CLINICAL DATA:  Reason for exam: Vomiting, aspiration into airway Hx HTN, pre-diabetes, fatty liver, pancreatitis, pneumonia EXAM: PORTABLE CHEST - 1 VIEW COMPARISON:  12/28/2018 FINDINGS: Feeding tube remains in place. Persistent low lung volumes with coarse perihilar and bibasilar interstitial markings. No new infiltrate or edema. Heart size and mediastinal contours are within normal limits. No effusion. Visualized bones unremarkable. IMPRESSION: 1. Stable chest x-ray. No new infiltrate or edema. 2. Stable appearance of the cardiomediastinal silhouette. Electronically Signed   By: Lucrezia Europe M.D.   On: 12/30/2018 09:42   DG CHEST PORT 1 VIEW  Result Date: 12/28/2018 CLINICAL DATA:  Updated status.  Fever. EXAM: PORTABLE CHEST 1 VIEW COMPARISON:  December 26, 2018 FINDINGS: The feeding tube terminates below today's film. Mild opacity in left base is somewhat streaky in platelike laterally. Low lung volumes. No pneumothorax. The lungs are otherwise clear. The cardiomediastinal silhouette is stable. IMPRESSION: Streaky opacity in left base is favored to represent atelectasis given the platelike configuration laterally. Developing infiltrate not  completely excluded. Recommend attention on follow-up. 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 EXAM: PORTABLE CHEST 1 VIEW COMPARISON:  Portable chest 12/21/2018. FINDINGS: Portable AP semi upright view at 0052 hours. Extubated and NG type tube removed since 12/21/2018. Lower lung volumes. Mediastinal contours remain normal. There is an enteric feeding tube in place which courses to the abdomen and probably crosses midline to the right but the tip is not included. Increased crowding of lung markings and/or pulmonary vascularity without overt edema. No pneumothorax, pleural effusion or consolidation. Stable visible bowel gas pattern. IMPRESSION: 1. Extubated with lower lung volumes. No other acute cardiopulmonary abnormality. 2. Enteric feeding tube courses to the abdomen, tip not included. Electronically Signed   By: Genevie Ann M.D.   On: 12/26/2018 01:08   DG CHEST PORT 1 VIEW  Result Date: 12/21/2018 CLINICAL DATA:  23 year old male with history of central line and nasogastric tube placement. EXAM: PORTABLE CHEST 1 VIEW COMPARISON:  Chest x-ray 12/21/2018. FINDINGS: An endotracheal tube is in place with tip 4.1 cm above the carina. There is a right-sided internal jugular central venous catheter with tip terminating in the distal superior vena cava. Nasogastric tube extending into the proximal stomach with side port just distal to the gastroesophageal junction. No pneumothorax. Lung volumes are low. Ill-defined opacities in the medial aspect of the left lung base and medial aspect of the right upper lobe. No pleural effusions. No evidence of pulmonary edema. Heart size is normal. IMPRESSION: 1. Support apparatus, as above. 2. Ill-defined opacities in the medial left lower lobe and medial right upper lobe concerning for multilobar pneumonia.  Electronically Signed   By: Vinnie Langton M.D.   On: 12/21/2018 17:22   DG CHEST PORT 1 VIEW  Result Date: 12/21/2018 CLINICAL DATA:  Aspiration, possible seizure EXAM: PORTABLE CHEST 1 VIEW COMPARISON:  12/20/2018 FINDINGS: No significant change in low volume AP portable examination without significant acute appearing airspace opacity. Esophagogastric tube remains with tip and side port below the diaphragm. Heart and mediastinum are unremarkable. IMPRESSION: No significant change in low volume portable chest x-ray. No new findings. Electronically Signed   By: Cristie Hem  Laqueta Carina M.D.   On: 12/21/2018 11:32   DG CHEST PORT 1 VIEW  Result Date: 12/20/2018 CLINICAL DATA:  Fever. History of hypertension seizures and pancreatitis. EXAM: PORTABLE CHEST 1 VIEW COMPARISON:  12/16/2018 and older exams. FINDINGS: Lung volumes are low. Atelectasis in the medial lung bases. Lungs otherwise clear. No pleural effusion or pneumothorax. Cardiac silhouette normal in size.  No mediastinal or hilar masses. Nasogastric tube passes below the diaphragm well into the stomach. IMPRESSION: 1. No acute cardiopulmonary disease. Electronically Signed   By: Lajean Manes M.D.   On: 12/20/2018 13:39   DG CHEST PORT 1 VIEW  Result Date: 12/16/2018 CLINICAL DATA:  Shortness of breath. EXAM: PORTABLE CHEST 1 VIEW COMPARISON:  December 15, 2018. FINDINGS: Again noted are low lung volumes with bibasilar atelectasis. The cardiac silhouette remains enlarged. There is no pneumothorax. No large pleural effusion. No acute osseous abnormality. An enteric tube is noted. IMPRESSION: Stable appearance of the chest. Electronically Signed   By: Constance Holster M.D.   On: 12/16/2018 15:02   DG Chest Port 1 View  Result Date: 12/15/2018 CLINICAL DATA:  Respiratory failure. EXAM: PORTABLE CHEST 1 VIEW COMPARISON:  12/14/2018 FINDINGS: 0514 hours. Low lung volumes. Interval improvement in left basilar aeration. The lungs are clear without focal  pneumonia, edema, pneumothorax or pleural effusion. Cardiopericardial silhouette accentuated by low volume AP technique. NG tube tip is in the mid stomach. Telemetry leads overlie the chest. IMPRESSION: Low volume film without acute cardiopulmonary findings. Electronically Signed   By: Misty Stanley M.D.   On: 12/15/2018 07:37   DG CHEST PORT 1 VIEW  Result Date: 12/14/2018 CLINICAL DATA:  Shortness of breath. EXAM: PORTABLE CHEST 1 VIEW COMPARISON:  Chest radiograph 12/10/2018, abdominal radiograph 12/12/2018 FINDINGS: Enteric tube is been retracted, tip now in the distal esophagus, side-port in the mid esophagus. Low lung volumes similar to prior exam. Slight increasing patchy opacities at the left lung base. Unchanged heart size and mediastinal contours. No pleural fluid or pneumothorax. IMPRESSION: 1. Enteric tube has been retracted, tip now in the distal esophagus, side-port in the mid esophagus. Recommend advancement of least 10 cm place the side-port below the diaphragm. 2. Persistent low lung volumes. Increasing patchy opacities at the left lung base, may be atelectasis or pneumonia. Electronically Signed   By: Keith Rake M.D.   On: 12/14/2018 05:22   DG CHEST PORT 1 VIEW  Result Date: 12/10/2018 CLINICAL DATA:  Dyspnea. EXAM: PORTABLE CHEST 1 VIEW COMPARISON:  December 09, 2018. FINDINGS: The heart size and mediastinal contours are within normal limits. No pneumothorax or pleural effusion is noted. Hypoinflation of the lungs is noted with minimal bibasilar subsegmental atelectasis. The visualized skeletal structures are unremarkable. IMPRESSION: Hypoinflation of the lungs with minimal bibasilar subsegmental atelectasis. Electronically Signed   By: Marijo Conception M.D.   On: 12/10/2018 09:17   DG CHEST PORT 1 VIEW  Result Date: 12/09/2018 CLINICAL DATA:  Tachypnea, low-grade fever EXAM: PORTABLE CHEST 1 VIEW COMPARISON:  12/09/2018 FINDINGS: Low lung volumes. Heart size is accentuated by  the low volumes. Bibasilar atelectasis. No effusions. No acute bony abnormality. IMPRESSION: Low lung volumes, bibasilar atelectasis. Electronically Signed   By: Rolm Baptise M.D.   On: 12/09/2018 21:38   DG Chest Port 1 View  Result Date: 12/18/2018 CLINICAL DATA:  Sepsis. EXAM: PORTABLE CHEST 1 VIEW COMPARISON:  11/21/2018 FINDINGS: A very poor inspiration is again demonstrated. The heart remains grossly normal in size. Interval minimal patchy and linear  density in the left lower lobe. Clear right lung. Mild peribronchial thickening. Unremarkable bones. IMPRESSION: 1. Interval minimal patchy and linear atelectasis or pneumonia in the left lower lobe. 2. Mild bronchitic changes. 3. Stable very poor inspiration. Electronically Signed   By: Claudie Revering M.D.   On: 12/14/2018 15:47   DG Abd Portable 1V  Result Date: 01/02/2019 CLINICAL DATA:  Severe sepsis. EXAM: PORTABLE ABDOMEN - 1 VIEW COMPARISON:  12/30/2018 FINDINGS: Air is present throughout the colon. Contrast is present over the right colon, terminal ileum and appendix. Small caliber probe projects over the rectum. Enteric tube is present with tip in the midline of the mid abdomen likely over the distal duodenum near the duodenal jejunal junction. Remainder the exam is unchanged IMPRESSION: 1.  Nonobstructive bowel gas pattern as described. 2. Enteric tube with tip unchanged just left of midline in the upper abdomen likely over the third/fourth portion of the duodenum. Electronically Signed   By: Marin Olp M.D.   On: 01/02/2019 08:03   DG Abd Portable 1V  Result Date: 12/30/2018 CLINICAL DATA:  Ileus EXAM: PORTABLE ABDOMEN - 1 VIEW COMPARISON:  Portable exam 1715 hours compared to 0906 hours FINDINGS: Tip of feeding tube projects over the third portion of duodenum approaching ligament of Treitz. Scattered gas throughout bowel, decreased from prior exam. No bowel dilatation or bowel wall thickening. Osseous structures unremarkable. No urinary  tract calcifications. IMPRESSION: Nonspecific bowel gas pattern. Decreased gaseous distention of bowel since prior study. Electronically Signed   By: Lavonia Dana M.D.   On: 12/30/2018 19:20   DG Abd Portable 1V  Result Date: 12/25/2018 CLINICAL DATA:  Feeding tube placement. EXAM: PORTABLE ABDOMEN - 1 VIEW COMPARISON:  December 24, 2018. FINDINGS: The bowel gas pattern is normal. Distal tip of feeding tube is seen in expected position of proximal duodenum. No radio-opaque calculi or other significant radiographic abnormality are seen. IMPRESSION: Negative. Electronically Signed   By: Marijo Conception M.D.   On: 12/25/2018 16:06   DG Abd Portable 1V  Result Date: 12/21/2018 CLINICAL DATA:  Nasogastric tube placement. EXAM: PORTABLE ABDOMEN - 1 VIEW COMPARISON:  December 20, 2018. FINDINGS: Mildly dilated air-filled large and small bowel loops are noted which may represent ileus. Distal tip of nasogastric tube is seen in proximal stomach. IMPRESSION: Distal tip of nasogastric tube seen in proximal stomach. Mildly dilated air-filled large and small bowel loops are noted which may represent ileus. Electronically Signed   By: Marijo Conception M.D.   On: 12/21/2018 17:22   DG Abd Portable 1V  Result Date: 12/20/2018 CLINICAL DATA:  NG placement EXAM: PORTABLE ABDOMEN - 1 VIEW COMPARISON:  Radiograph 12/20/2018 FINDINGS: Transesophageal tube tip and side port distal to the GE junction, curling in the left upper quadrant in the region of the gastric body. Atelectatic changes in the otherwise clear lung bases. Gaseous distention of what appears to be the colon without high-grade bowel obstruction. IMPRESSION: 1. Transesophageal tube tip and side port in the region of the gastric body. 2. Gaseous distention of the colon without high-grade bowel obstruction. Electronically Signed   By: Lovena Le M.D.   On: 12/20/2018 02:45   DG Abd Portable 1V  Result Date: 12/20/2018 CLINICAL DATA:  Check gastric catheter  placement EXAM: PORTABLE ABDOMEN - 1 VIEW COMPARISON:  12/20/2018 FINDINGS: Gastric catheter has been advanced with the tip in the stomach. Proximal side port lies at the gastroesophageal junction. This could be advanced several cm. The lungs  are clear. IMPRESSION: Gastric catheter as described. The proximal side port is not within the stomach and should be advanced several cm. Electronically Signed   By: Inez Catalina M.D.   On: 12/20/2018 01:50   DG Abd Portable 1V  Result Date: 12/20/2018 CLINICAL DATA:  NG tube placement EXAM: PORTABLE ABDOMEN - 1 VIEW COMPARISON:  12/16/2018 abdominal radiograph FINDINGS: The side port of the NG tube is in the distal esophagus. Recommend advancing by 7-10 cm. Lungs are clear. Nonobstructive bowel gas pattern. IMPRESSION: 1. Side-port of the nasogastric tube in the distal esophagus. Recommend advancing by 7-10 cm. 2. No active cardiopulmonary disease. Electronically Signed   By: Ulyses Jarred M.D.   On: 12/20/2018 00:41   DG Abd Portable 1V  Result Date: 12/12/2018 CLINICAL DATA:  NG tube placement. EXAM: PORTABLE ABDOMEN - 1 VIEW COMPARISON:  Radiograph dated 12/10/2018 FINDINGS: NG tube has been inserted and the tip is in the midbody of the stomach. The visualized bowel gas pattern is normal. No visible acute bone abnormality. IMPRESSION: NG tube in the body of the stomach. Electronically Signed   By: Lorriane Shire M.D.   On: 12/12/2018 21:18   DG Abd Portable 1V  Result Date: 12/10/2018 CLINICAL DATA:  Dyspnea, abdominal pain and distention EXAM: PORTABLE ABDOMEN - 1 VIEW COMPARISON:  12/24/2018 abdominal radiograph FINDINGS: There are mildly dilated small bowel loops throughout the abdomen measuring up to 3.9 cm diameter in the left abdomen, mildly decreased in caliber. No evidence of pneumatosis or pneumoperitoneum. No radiopaque nephrolithiasis. IMPRESSION: Diffuse mild small bowel dilatation, mildly decreased, which could represent either improving adynamic  ileus or improving partial distal small bowel obstruction. Electronically Signed   By: Ilona Sorrel M.D.   On: 12/10/2018 09:19   DG Abd Portable 1V  Result Date: 12/09/2018 CLINICAL DATA:  Nasogastric tube placement. EXAM: PORTABLE ABDOMEN - 1 VIEW COMPARISON:  11/28/2018 FINDINGS: No nasogastric tube seen in the lower chest or upper abdomen. Multiple dilated small bowel loops with mild improvement. Gas is again demonstrated in normal caliber colon. Mild scoliosis. IMPRESSION: 1. No nasogastric tube seen in the lower chest or upper abdomen. 2. Mildly improved small bowel obstruction. Electronically Signed   By: Claudie Revering M.D.   On: 12/09/2018 11:40   DG Abd Portable 1V  Result Date: 12/16/2018 CLINICAL DATA:  Sepsis. Fever. EXAM: PORTABLE ABDOMEN - 1 VIEW COMPARISON:  11/23/2018 and abdomen and pelvis CT dated 11/24/2018. FINDINGS: Interval multiple mildly dilated loops of proximal small bowel with gas-filled normal caliber distal small bowel and colon. Unremarkable bones. IMPRESSION: Interval mild small bowel ileus or partial obstruction. No gross free air seen at this time. Electronically Signed   By: Claudie Revering M.D.   On: 12/09/2018 15:49   EEG adult  Result Date: 12/21/2018 Lora Havens, MD     12/21/2018 12:59 PM Patient Name:Johm Verl Dicker NOM:[id number] Epilepsy Attending:Priyanka Barbra Sarks Referring Physician/Provider:Dr Kathrynn Speed Date: 12/21/2018 Duration: 23.02 minutes  Patient history:23 year old male with known epilepsy who was noted to have episode of extension of arm with eyes rolling upwards concerning for seizure.. EEG to evaluate for seizures.   Level of alertness:Awake  AEDs during EEG study:Keppra, Onfi  Technical aspects: This EEG study was done with scalp electrodes positioned according to the 10-20 International system of electrode placement. Electrical activity was acquired at a sampling rate of '500Hz'$  and reviewed with a high frequency filter of  '70Hz'$  and a low frequency filter of '1Hz'$ . EEG data were recorded  continuously and digitally stored.  Description:During awake state, no clear posterior dominant rhythm was seen. EEG showed continuous generalized 5 to 6 Hz theta slowing admixed withan excessive amount of 15 to 18 Hz, 2-3 uV beta activity with irregular morphology distributed symmetrically and diffusely. Hyperventilation and photic stimulation were not performed.  Abnormality -Continuous slow, generalized -Excessive beta, generalized    IMPRESSION: This study is suggestive of mild to moderate diffuse encephalopathy, nonspecific to etiology. No seizures or epileptiform discharges were seen throughout the recording.  The excessive beta activity seen in the background is most likely due to the effect of benzodiazepine and is a benign EEG pattern.  Priyanka Barbra Sarks  Overnight EEG with video  Result Date: 12/22/2018 Lora Havens, MD     12/23/2018  9:31 AM Patient Name:Diamond Verl Dicker YQM:[id number] Epilepsy Attending:Priyanka Barbra Sarks Referring Physician/Provider:Dr Kathrynn Speed Duration: 12/21/2018 1231 to 12/22/2018 1231  Patient history:23 year old male with known epilepsy who was noted to have episode of extension of arm with eyes rolling upwards concerning for seizure. EEG to evaluate for seizures.   Level of alertness:Awake, asleep  AEDs during EEG study:Keppra, Onfi  Technical aspects: This EEG study was done with scalp electrodes positioned according to the 10-20 International system of electrode placement. Electrical activity was acquired at a sampling rate of '500Hz'$  and reviewed with a high frequency filter of '70Hz'$  and a low frequency filter of '1Hz'$ . EEG data were recorded continuously and digitally stored.  Description:During awake state, no clear posterior dominant rhythm was seen. Sleep was characterized by vertex waves, sleep spindles (12-'14Hz'$ ), maximal frontocentral.EEG showed continuous generalized 5  to 6 Hz theta slowing admixed withan excessive amount of 15 to 18 Hz, 2-3 uV beta activity with irregular morphology distributed symmetrically and diffusely. Hyperventilation and photic stimulation were not performed.  Abnormality -Continuous slow, generalized -Excessive beta, generalized   IMPRESSION: This study is suggestive of mild to moderate diffuse encephalopathy, nonspecific to etiology. No seizures or epileptiform discharges were seen throughout the recording.  The excessive beta activity seen in the background is most likely due to the effect of benzodiazepine and is a benign EEG pattern. Priyanka Barbra Sarks   Overnight EEG with video  Result Date: 12/11/2018 Lora Havens, MD     12/22/2018  8:54 AM Patient Name: Ramell Wacha MRN: [id number] Epilepsy Attending: Lora Havens Referring Physician/Provider: Dr [name] Duration: 12/10/2018 1946 to 12/11/2018 1218  Patient history: 22 year old male with known epilepsy presented with altered mental status.  EEG to evaluate for seizures.   Level of alertness: Awake, asleep  AEDs during EEG study: Keppra, Onfi  Technical aspects: This EEG study was done with scalp electrodes positioned according to the 10-20 International system of electrode placement. Electrical activity was acquired at a sampling rate of '500Hz'$  and reviewed with a high frequency filter of '70Hz'$  and a low frequency filter of '1Hz'$ . EEG data were recorded continuously and digitally stored.  Description: During awake state, no clear posterior dominant rhythm was seen.  Sleep was characterized by vertex waves, sleep spindles (12 to 14 Hz), maximal frontocentral.  EEG showed continuous generalized 5 to 6 Hz theta slowing admixed with an excessive amount of 15 to 18 Hz, 2-3 uV beta activity with irregular morphology distributed symmetrically and diffusely.  Hyperventilation and photic stimulation were not performed.  Abnormality -Continuous slow, generalized -Excessive beta,  generalized     IMPRESSION: This study is suggestive of mild diffuse encephalopathy, nonspecific to etiology. No seizures or epileptiform discharges were  seen throughout the recording.  The excessive beta activity seen in the background is most likely due to the effect of benzodiazepine and is a benign EEG pattern.  Lora Havens   ECHOCARDIOGRAM COMPLETE  Result Date: 12/12/2018   ECHOCARDIOGRAM REPORT   Patient Name:   MARKEE MATERA Date of Exam: 12/12/2018 Medical Rec #:  [id number]      Height:       71.0 in Accession #:    [id number]     Weight:       298.0 lb Date of Birth:  09/30/1995       BSA:          2.50 m Patient Age:    23 years       BP:           110/86 mmHg Patient Gender: M              HR:           124 bpm. Exam Location:  Inpatient Procedure: 2D Echo and Intracardiac Opacification Agent Indications:    Abnormal ECG 794.31/R94.31  History:        Patient has no prior history of Echocardiogram examinations.  Sonographer:    Clayton Lefort RDCS (AE) Referring Phys: [id number] [name]  Sonographer Comments: Technically difficult study due to poor echo windows, suboptimal apical window, suboptimal parasternal window and patient is morbidly obese. Image acquisition challenging due to patient body habitus. IMPRESSIONS  1. Left ventricular ejection fraction, by visual estimation, is 55 to 60%. The left ventricle has normal function. There is mildly increased left ventricular hypertrophy.  2. Definity contrast agent was given IV to delineate the left ventricular endocardial borders.  3. The left ventricle has no regional wall motion abnormalities.  4. Indeterminate diastolic filling due to E-A fusion.  5. Global right ventricle has normal systolic function.The right ventricular size is normal. No increase in right ventricular wall thickness.  6. Left atrial size was normal.  7. Right atrial size was normal.  8. The mitral valve is normal in structure. No evidence of mitral valve regurgitation.  No evidence of mitral stenosis.  9. The tricuspid valve is normal in structure. Tricuspid valve regurgitation is not demonstrated. 10. The aortic valve is tricuspid. Aortic valve regurgitation is not visualized. No evidence of aortic valve sclerosis or stenosis. 11. TR signal is inadequate for assessing pulmonary artery systolic pressure. 12. Technically difficult study with poor acoustic windows. FINDINGS  Left Ventricle: Left ventricular ejection fraction, by visual estimation, is 55 to 60%. The left ventricle has normal function. Definity contrast agent was given IV to delineate the left ventricular endocardial borders. The left ventricle has no regional wall motion abnormalities. The left ventricular internal cavity size was the left ventricle is normal in size. There is mildly increased left ventricular hypertrophy. Indeterminate diastolic filling due to E-A fusion. Right Ventricle: The right ventricular size is normal. No increase in right ventricular wall thickness. Global RV systolic function is has normal systolic function. Left Atrium: Left atrial size was normal in size. Right Atrium: Right atrial size was normal in size Pericardium: There is no evidence of pericardial effusion. Mitral Valve: The mitral valve is normal in structure. No evidence of mitral valve stenosis by observation. No evidence of mitral valve regurgitation. Tricuspid Valve: The tricuspid valve is normal in structure. Tricuspid valve regurgitation is not demonstrated. Aortic Valve: The aortic valve is tricuspid. Aortic valve regurgitation is not visualized. The aortic valve is structurally  normal, with no evidence of sclerosis or stenosis. Aortic valve mean gradient measures 5.0 mmHg. Aortic valve peak gradient measures 8.3 mmHg. Aortic valve area, by VTI measures 2.58 cm. Pulmonic Valve: The pulmonic valve was normal in structure. Pulmonic valve regurgitation is not visualized. Aorta: The aortic root is normal in size and structure.  Venous: The inferior vena cava was not well visualized. IAS/Shunts: No atrial level shunt detected by color flow Doppler.  LEFT VENTRICLE PLAX 2D LVIDd:         4.14 cm LVIDs:         3.35 cm LV PW:         1.38 cm LV IVS:        1.32 cm LVOT diam:     2.10 cm LV SV:         30 ml LV SV Index:   11.34 LVOT Area:     3.46 cm  RIGHT VENTRICLE RV Basal diam:  3.34 cm RV S prime:     23.10 cm/s TAPSE (M-mode): 2.8 cm LEFT ATRIUM             Index       RIGHT ATRIUM           Index LA diam:        4.00 cm 1.60 cm/m  RA Area:     18.00 cm LA Vol (A2C):   50.6 ml 20.26 ml/m RA Volume:   49.50 ml  19.82 ml/m LA Vol (A4C):   56.1 ml 22.46 ml/m LA Biplane Vol: 55.1 ml 22.06 ml/m  AORTIC VALVE AV Area (Vmax):    2.69 cm AV Area (Vmean):   2.33 cm AV Area (VTI):     2.58 cm AV Vmax:           144.00 cm/s AV Vmean:          104.000 cm/s AV VTI:            0.234 m AV Peak Grad:      8.3 mmHg AV Mean Grad:      5.0 mmHg LVOT Vmax:         112.00 cm/s LVOT Vmean:        70.000 cm/s LVOT VTI:          0.174 m LVOT/AV VTI ratio: 0.74  AORTA Ao Root diam: 2.20 cm  SHUNTS Systemic VTI:  0.17 m Systemic Diam: 2.10 cm  Loralie Champagne MD Electronically signed by Loralie Champagne MD Signature Date/Time: 12/12/2018/5:14:36 PM    Final    VAS [place] LOWER EXTREMITY VENOUS (DVT)  Result Date: 12/22/2018  Lower Venous Study Indications: Edema.  Risk Factors: None identified. Limitations: Body habitus and poor ultrasound/tissue interface. Comparison Study: No prior studies. Performing Technologist: Oliver Hum RVT  Examination Guidelines: A complete evaluation includes B-mode imaging, spectral Doppler, color Doppler, and power Doppler as needed of all accessible portions of each vessel. Bilateral testing is considered an integral part of a complete examination. Limited examinations for reoccurring indications may be performed as noted.  +---------+---------------+---------+-----------+----------+--------------+ RIGHT     CompressibilityPhasicitySpontaneityPropertiesThrombus Aging +---------+---------------+---------+-----------+----------+--------------+ CFV      Full           Yes      Yes                                 +---------+---------------+---------+-----------+----------+--------------+ SFJ      Full                                                        +---------+---------------+---------+-----------+----------+--------------+  FV Prox  Full                                                        +---------+---------------+---------+-----------+----------+--------------+ FV Mid   Full                                                        +---------+---------------+---------+-----------+----------+--------------+ FV DistalFull                                                        +---------+---------------+---------+-----------+----------+--------------+ PFV      Full                                                        +---------+---------------+---------+-----------+----------+--------------+ POP      Full           Yes      Yes                                 +---------+---------------+---------+-----------+----------+--------------+ PTV      Full                                                        +---------+---------------+---------+-----------+----------+--------------+ PERO     Full                                                        +---------+---------------+---------+-----------+----------+--------------+   +---------+---------------+---------+-----------+----------+--------------+ LEFT     CompressibilityPhasicitySpontaneityPropertiesThrombus Aging +---------+---------------+---------+-----------+----------+--------------+ CFV      Full           Yes      Yes                                 +---------+---------------+---------+-----------+----------+--------------+ SFJ      Full                                                         +---------+---------------+---------+-----------+----------+--------------+ FV Prox  Full                                                        +---------+---------------+---------+-----------+----------+--------------+  FV Mid   Full                                                        +---------+---------------+---------+-----------+----------+--------------+ FV DistalFull                                                        +---------+---------------+---------+-----------+----------+--------------+ PFV      Full                                                        +---------+---------------+---------+-----------+----------+--------------+ POP      Full           Yes      Yes                                 +---------+---------------+---------+-----------+----------+--------------+ PTV      Full                                                        +---------+---------------+---------+-----------+----------+--------------+ PERO     Full                                                        +---------+---------------+---------+-----------+----------+--------------+     Summary: Right: There is no evidence of deep vein thrombosis in the lower extremity. No cystic structure found in the popliteal fossa. Left: There is no evidence of deep vein thrombosis in the lower extremity. No cystic structure found in the popliteal fossa.  *See table(s) above for measurements and observations. Electronically signed by Curt Jews MD on 12/22/2018 at 9:30:31 AM.    Final    [place] EKG SITE RITE  Result Date: 12/31/2018 If Site Rite image not attached, placement could not be confirmed due to current cardiac rhythm.  DG FL GUIDED LUMBAR PUNCTURE  Result Date: 12/14/2018 CLINICAL DATA:  Lower extremity weakness, decreased PO intake, dehydration, concern for Guillain-Barre syndrome EXAM: DIAGNOSTIC LUMBAR PUNCTURE UNDER FLUOROSCOPIC GUIDANCE FLUOROSCOPY  TIME:  Fluoroscopy Time: 72 seconds Radiation Exposure Index (if provided by the fluoroscopic device): 395.4 mGy Number of Acquired Spot Images: 3 PROCEDURE: Informed consent was obtained from the patient prior to the procedure, including potential complications of headache, allergy, and pain. With the patient prone, the lower back was prepped with Betadine. 1% Lidocaine was used for local anesthesia. Lumbar puncture was performed at the L5-S1 level using a 20 gauge needle with return of initially blood tinged, subsequently clear CSF. Extremely low opening pressure, which was not measured. 10.5 ml of CSF were obtained for laboratory studies. The patient tolerated the procedure well  and there were no apparent complications. During the procedure, the patient's NG tube started to withdraw. This was returned to the original position in the gastric cardia and advanced slightly into the mid body of the stomach. IMPRESSION: Successful fluoroscopic guided lumbar puncture at L5-S1. Patient's NG tube readjusted, as described above. Electronically Signed   By: Julian Hy M.D.   On: 12/14/2018 17:19     Time Spent in minutes  40     Desiree Hane M.D on 01/07/2019 at 11:06 AM  To page go to [email or web address] - password [facility]

## 2019-01-07 NOTE — Progress Notes (Signed)
Attempting CPT with patient. Family member having discussion with MD asked to come back at a later time. RT will attempt CPT at a later time.

## 2019-01-08 ENCOUNTER — Inpatient Hospital Stay (HOSPITAL_COMMUNITY)

## 2019-01-08 DIAGNOSIS — R451 Restlessness and agitation: Secondary | ICD-10-CM

## 2019-01-08 LAB — GLUCOSE, CAPILLARY
Glucose-Capillary: 90 mg/dL (ref 70–99)
Glucose-Capillary: 105 mg/dL — ABNORMAL HIGH (ref 70–99)
Glucose-Capillary: 117 mg/dL — ABNORMAL HIGH (ref 70–99)

## 2019-01-08 LAB — CBC
HCT: 32.8 % — ABNORMAL LOW (ref 39.0–52.0)
Hemoglobin: 10.5 g/dL — ABNORMAL LOW (ref 13.0–17.0)
MCH: 28.6 pg (ref 26.0–34.0)
MCHC: 32 g/dL (ref 30.0–36.0)
MCV: 89.4 fL (ref 80.0–100.0)
Platelets: 478 10*3/uL — ABNORMAL HIGH (ref 150–400)
RBC: 3.67 MIL/uL — ABNORMAL LOW (ref 4.22–5.81)
RDW: 18.3 % — ABNORMAL HIGH (ref 11.5–15.5)
WBC: 12.7 10*3/uL — ABNORMAL HIGH (ref 4.0–10.5)
nRBC: 0 % (ref 0.0–0.2)

## 2019-01-08 LAB — BASIC METABOLIC PANEL
Anion gap: 10 (ref 5–15)
BUN: 13 mg/dL (ref 6–20)
CO2: 27 mmol/L (ref 22–32)
Calcium: 9.4 mg/dL (ref 8.9–10.3)
Chloride: 99 mmol/L (ref 98–111)
Creatinine, Ser: <0.3 mg/dL — ABNORMAL LOW (ref 0.61–1.24)
Glucose, Bld: 101 mg/dL — ABNORMAL HIGH (ref 70–99)
Potassium: 4.5 mmol/L (ref 3.5–5.1)
Sodium: 136 mmol/L (ref 135–145)

## 2019-01-08 IMAGING — DX DG ABD PORTABLE 1V
1 series · 2 of 2 positions shown · non-contrast
Comparison: [DATE] abdominal radiograph.

CLINICAL DATA: Gastrostomy tube. No bowel movement for several
days. History of ileus. Tachycardia.

EXAM:
PORTABLE ABDOMEN - 1 VIEW

[Series 1: abdomen · 0.14mm/px · 2 of 2 slices shown]
[im 1/2]
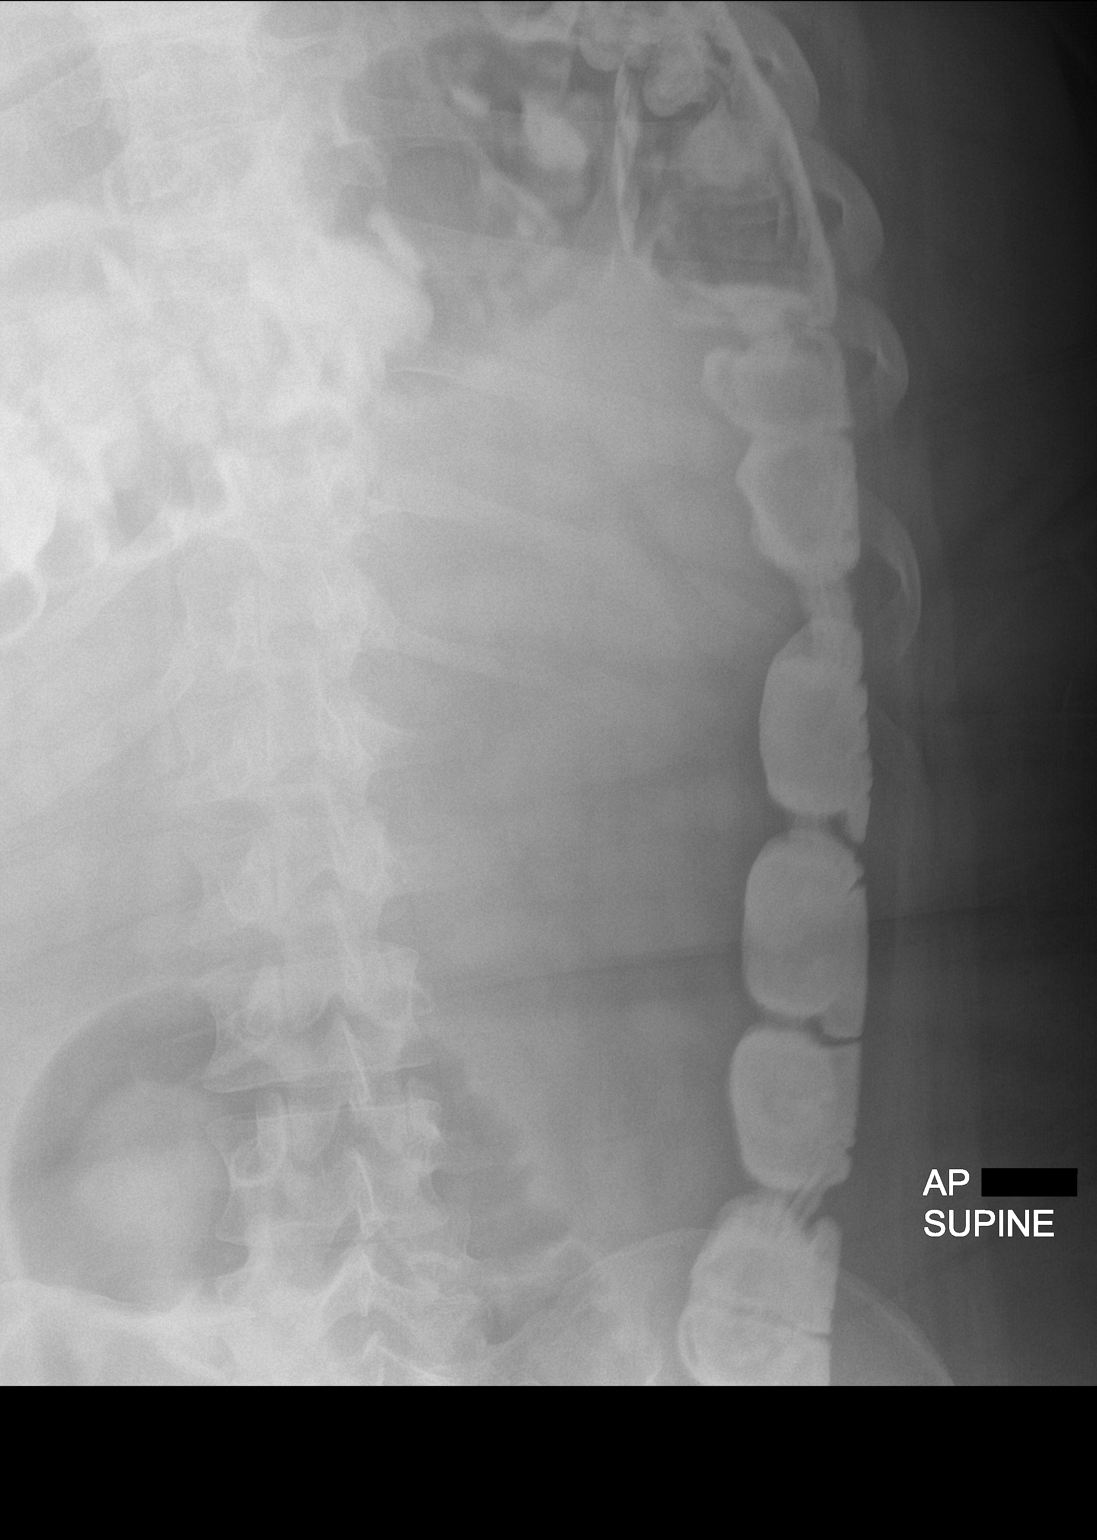
[im 2/2]
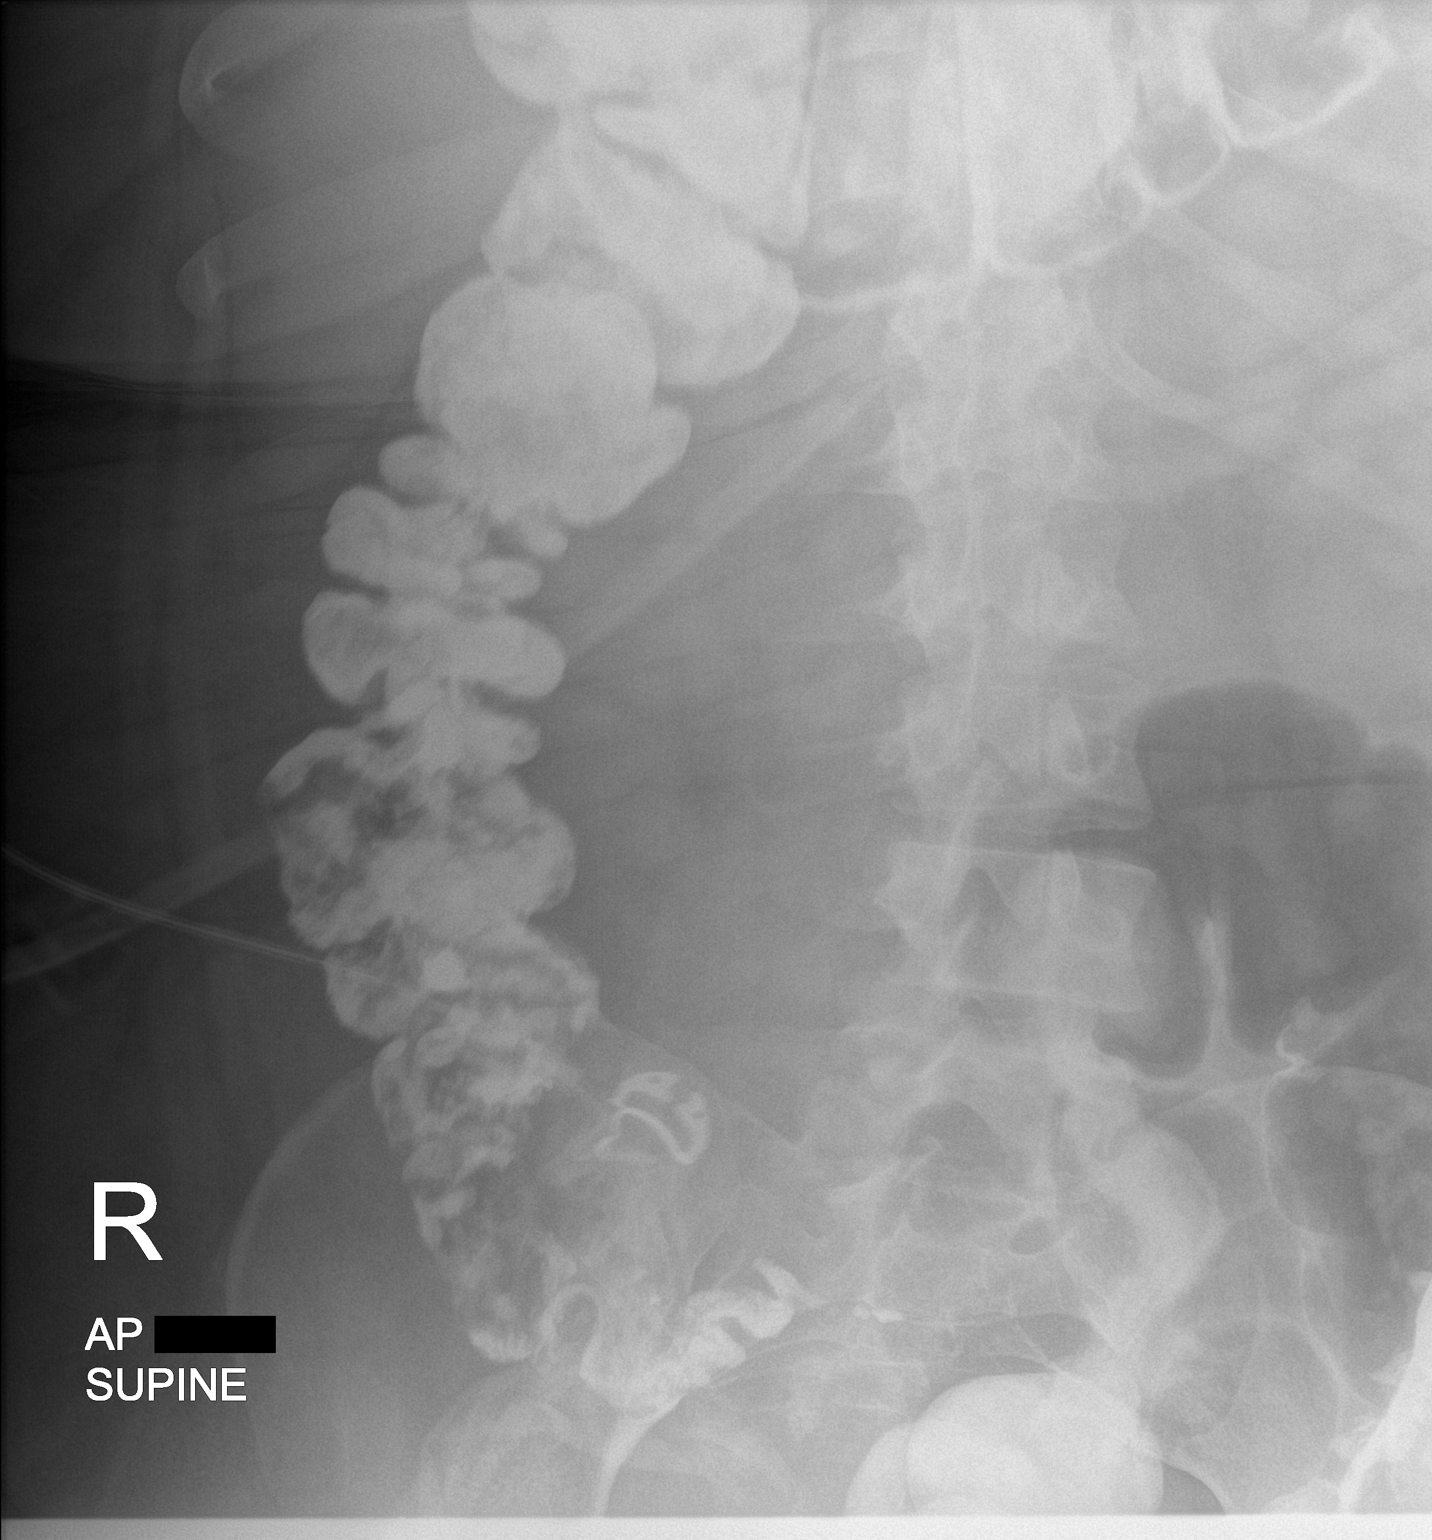

[2 of 2 positions shown; findings below may reference images not displayed]

FINDINGS: No dilated small bowel loops. Retained oral contrast noted
throughout the colon, with interval transit of oral contrast at
least to the sigmoid colon. Mild-to-moderate stool and gas in the
colon. No evidence of pneumatosis or pneumoperitoneum. No radiopaque
nephrolithiasis. Gastrostomy tube not well visualized on these
radiographs presumably obscured by retained oral contrast in the
colon.
IMPRESSION: Nonobstructive bowel gas pattern. Retained oral contrast throughout
the colon. Mild-to-moderate colonic stool and gas.

## 2019-01-08 IMAGING — CT CT ABD-PELV W/ CM
2 of 4 series · 16 of 46 positions shown, 18 images · IV contrast (Omni 300)
Comparison: [DATE]

CLINICAL DATA: Intestinal dysmotility. Persistent leukocytosis.
Decreased stool output.

EXAM:
CT ABDOMEN AND PELVIS WITH CONTRAST
TECHNIQUE: Multidetector CT imaging of the abdomen and pelvis was performed
using the standard protocol following bolus administration of
intravenous contrast.
CONTRAST:  100mL OMNIPAQUE IOHEXOL 300 MG/ML  SOLN

[Series 3: a/p w/ 5mm · axial · 0.98mm/px · z∈[-1212,-666]mm · 13 of 119 slices shown, 15 images]
[im 5/119  soft-tissue]
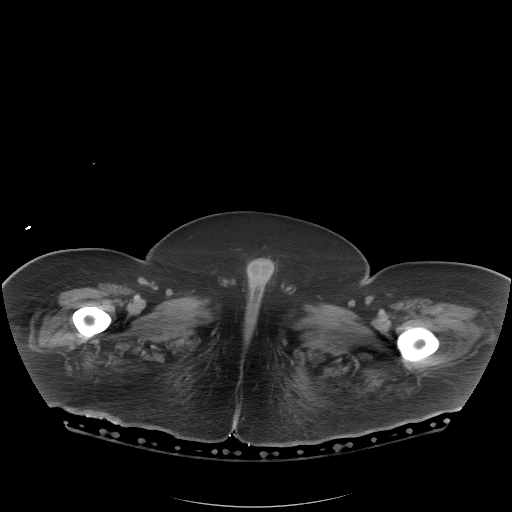
[im 5/119  bone]
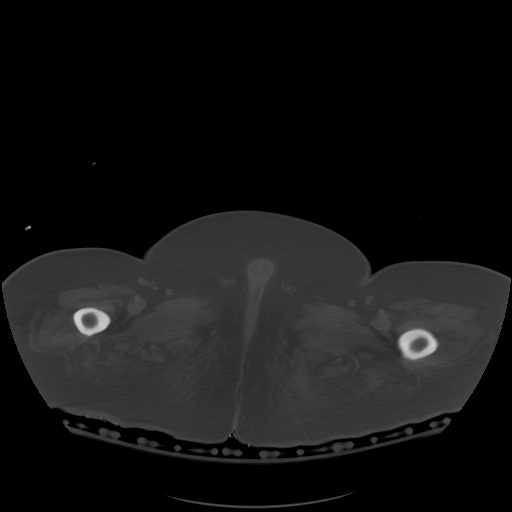
[im 14/119  soft-tissue]
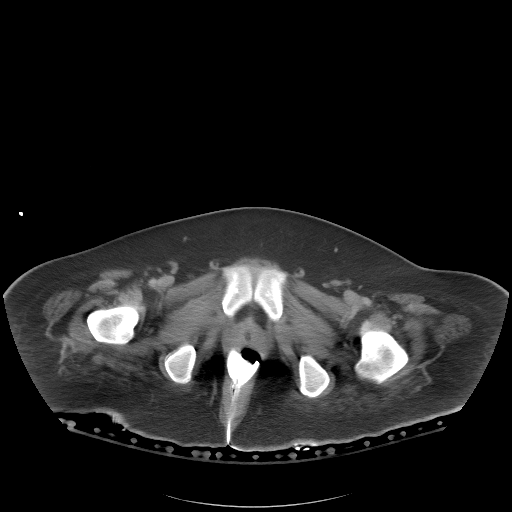
[im 23/119  soft-tissue]
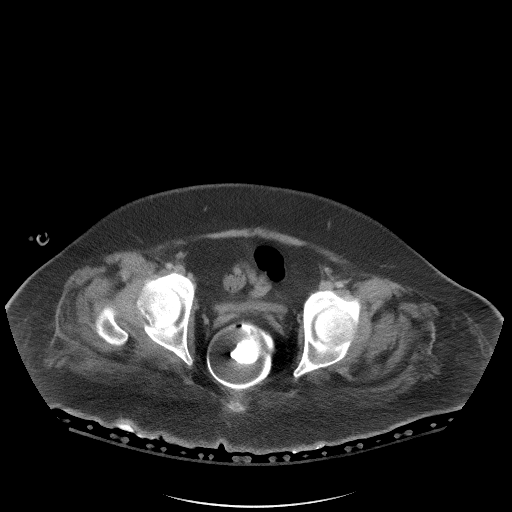
[im 32/119  soft-tissue]
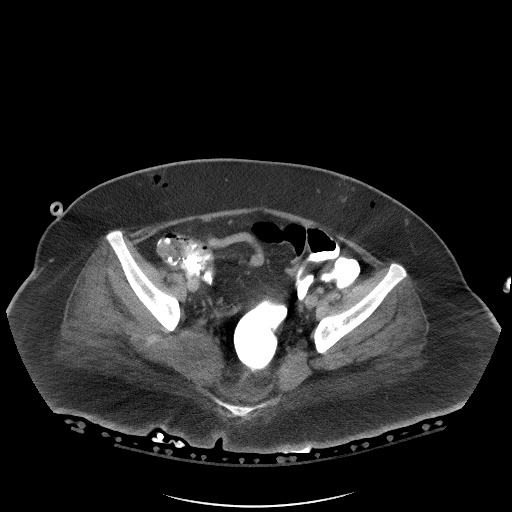
[im 41/119  soft-tissue]
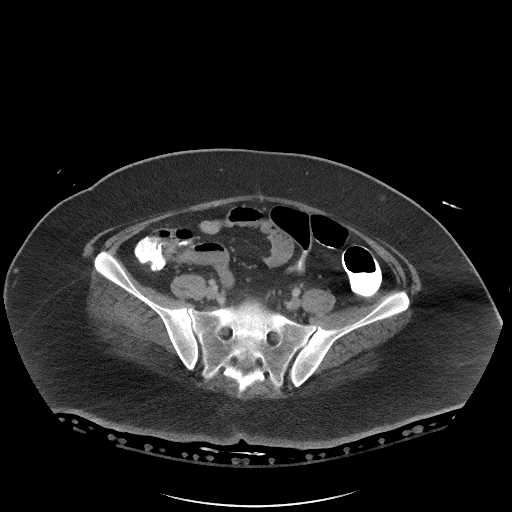
[im 50/119  soft-tissue]
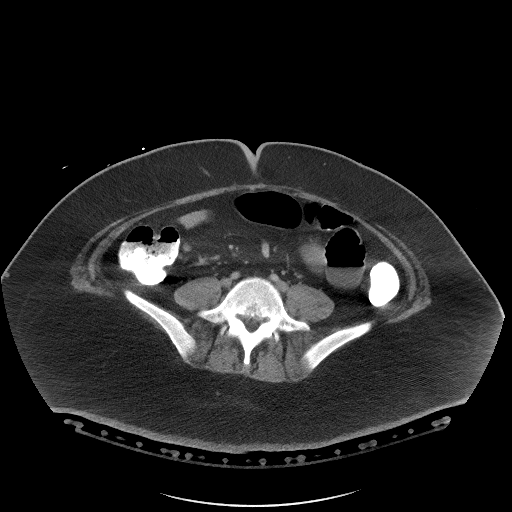
[im 60/119  soft-tissue]
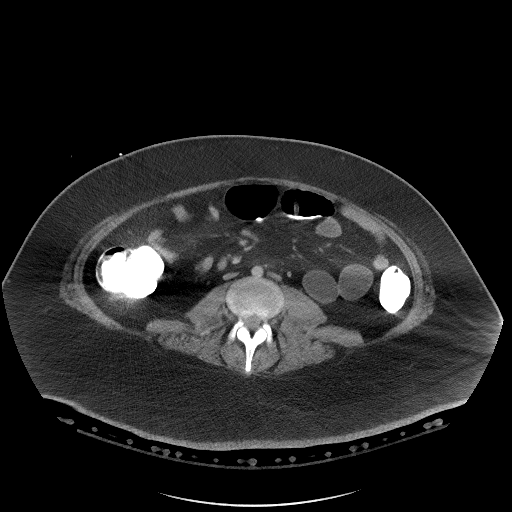
[im 69/119  soft-tissue]
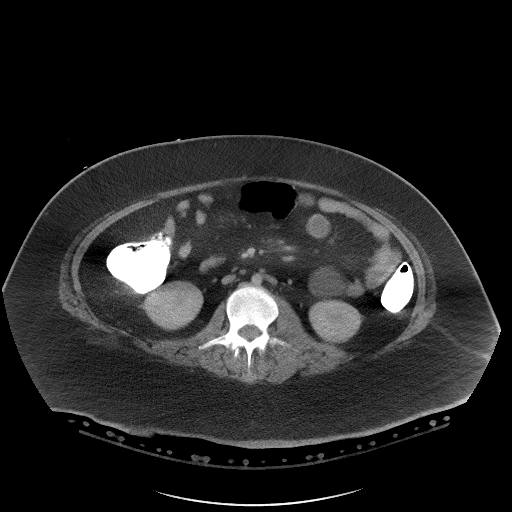
[im 78/119  soft-tissue]
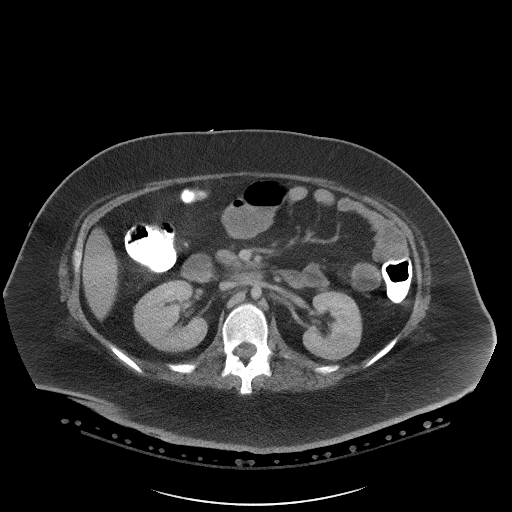
[im 78/119  bone]
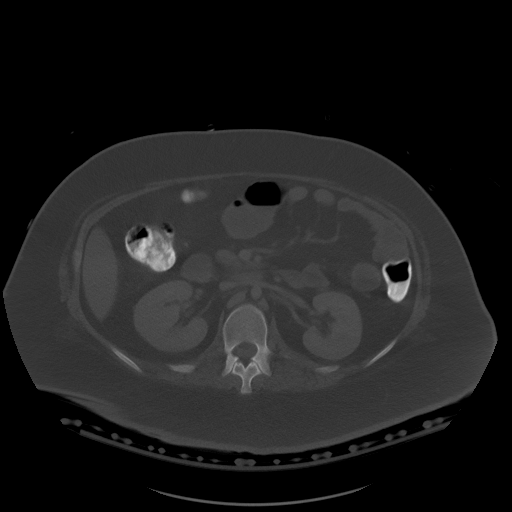
[im 87/119  soft-tissue]
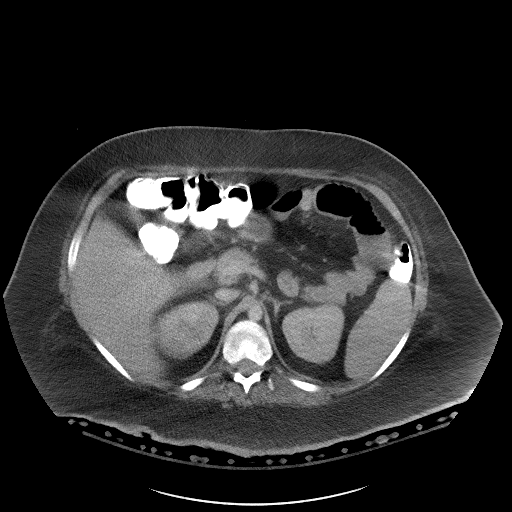
[im 96/119  soft-tissue]
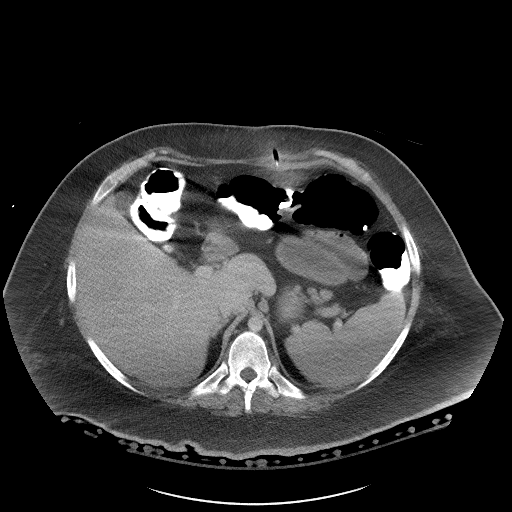
[im 105/119  soft-tissue]
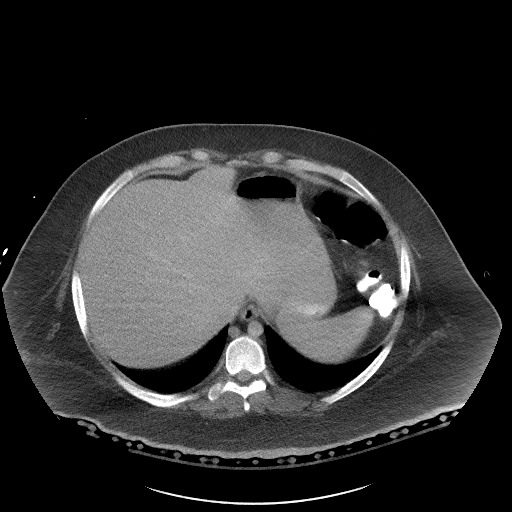
[im 114/119  soft-tissue]
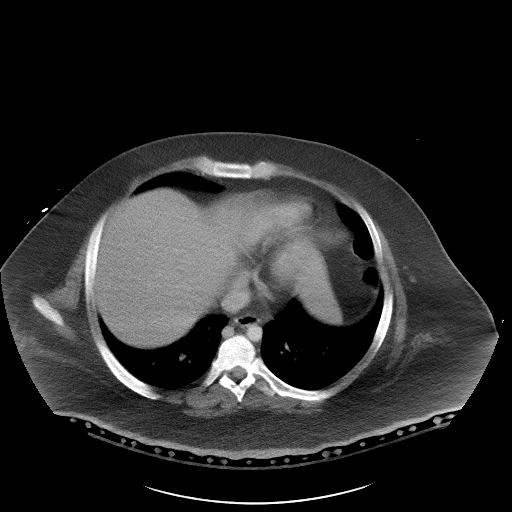

[Series 6: a/p w/ cor · coronal · 1.16mm/px · 3 of 191 slices shown]
[im 64/191  soft-tissue]
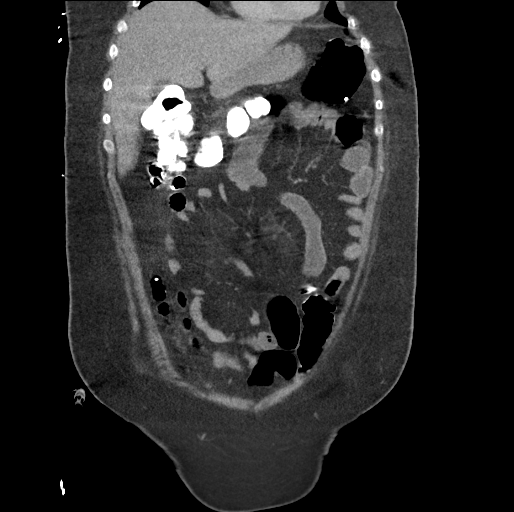
[im 85/191  soft-tissue]
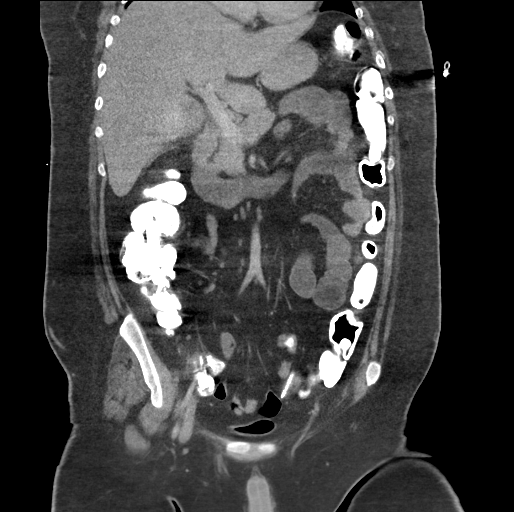
[im 106/191  soft-tissue]
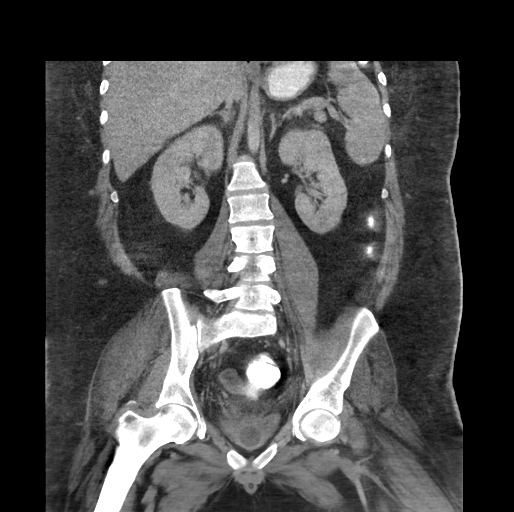

[16 of 46 positions shown; findings below may reference images not displayed]

FINDINGS: Lower chest: The lung bases are clear. The heart size is normal.

Hepatobiliary: There is decreased hepatic attenuation suggestive of
hepatic steatosis. Normal gallbladder.There is no biliary ductal
dilation.

Pancreas: Normal contours without ductal dilatation. No
peripancreatic fluid collection.

Spleen: No splenic laceration or hematoma.

Adrenals/Urinary Tract:

--Adrenal glands: No adrenal hemorrhage.

--Right kidney/ureter: No hydronephrosis or perinephric hematoma.

--Left kidney/ureter: No hydronephrosis or perinephric hematoma.

--Urinary bladder: The bladder is decompressed with a Foley catheter

Stomach/Bowel:

--Stomach/Duodenum: There is a new percutaneous gastrostomy tube in
place. The tube appears well position. There is some mild fat
stranding about the gastrostomy tube tract site without evidence for
a well-formed drainable fluid collection.

--Small bowel: There are mildly dilated loops of fluid-filled small
bowel throughout the abdomen without evidence for high-grade
obstruction.

--Colon: There is a rectal tube in place. Oral contrast is noted
throughout the colon. The colon is relatively decompressed when
compared to the prior CT.

--Appendix: Normal.

Vascular/Lymphatic: Incidentally noted is a duplicated IVC, a normal
variant. The aorta is unremarkable.

--No retroperitoneal lymphadenopathy.

--No mesenteric lymphadenopathy.

--No pelvic or inguinal lymphadenopathy.

Reproductive: Unremarkable

Other: No ascites or free air. The abdominal wall is normal.

Musculoskeletal. No acute displaced fractures.
IMPRESSION: 1. There is a new percutaneous gastrostomy tube in place. The tube
appears well position. There is some mild fat stranding about the
gastrostomy tube tract without evidence for a well-formed drainable
fluid collection. Findings may be secondary to the recent
intervention versus a developing soft tissue infection.
2. There are mildly dilated loops of fluid-filled small bowel
throughout the abdomen without evidence for high-grade obstruction.
Oral contrast is noted throughout the colon. The colon is relatively
decompressed when compared to the prior CT.
3. Hepatic steatosis.
4. Incidentally noted duplicated IVC, a normal variant.

## 2019-01-08 MED ORDER — IOHEXOL 300 MG/ML  SOLN
100.0000 mL | Freq: Once | INTRAMUSCULAR | Status: AC | PRN
  Administered 2019-01-08: 100 mL via INTRAVENOUS

## 2019-01-08 NOTE — Progress Notes (Signed)
Physical Therapy Treatment Patient Details Name: Aaron Vega MRN: [id number] DOB: 02-04-1995 Today's Date: 01/08/2019    History of Present Illness 23 y.o. male with medical history significant of autism (nonverbal), Tourettes, seizures, obesity, reflux esophagitis, hypertension constipation and sleep apnea. 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 Admitted 12-21-2018 for lethargy and vomiting. Found to have acute on chronic respiratory failure and intubated 11/27-11/30, ileus, HTN, tachycardia, aspiration, ascending weakness and encephalopathy.    PT Comments    Patient alert throughout session. Tracking consistently; following commands 100% of the time (when asked to do things within his capabilities: raise your arm, turn your head, close your eyes). Mom present initially and confirmed she has been doing PROM x 4 extremities. Did not progress to chair position as his HR increased to 148 (sustained) with bed level exercises.     Follow Up Recommendations  SNF(Family refusing. Maximize HHPT and pt services if discharges home.)     [facility] bed;Other (comment)(hoyer lift)    Recommendations for Other Services       Precautions / Restrictions Precautions Precautions: Fall;Other (comment) Precaution Comments: rectal tube;     Mobility  Bed Mobility               General bed mobility comments: deferred EOB as HR increased 128 to 148 with bed exercises  Transfers                 General transfer comment: not appropriate at this time  Ambulation/Gait                 Stairs             Wheelchair Mobility    Modified Rankin (Stroke Patients Only)       Balance                                            Cognition Arousal/Alertness: Awake/alert Behavior During Therapy: Flat affect Overall  Cognitive Status: Difficult to assess Area of Impairment: Following commands                       Following Commands: Follows one step commands inconsistently       General Comments: Pt consistently tracking therapist moving from one side of bed to the other; followed 1 step command with bil UEs (raise your arm)100% of the time; also turn your head to the other side 100% of time      Exercises General Exercises - Lower Extremity Ankle Circles/Pumps: PROM;Both;Supine(30 second stretch bil x 30 sec) Heel Slides: PROM;Both;Supine;10 reps Hip ABduction/ADduction: PROM;Both;Supine;10 reps Shoulder Exercises Shoulder Flexion: Both;Supine;AROM(pt does automatically but also on command)    General Comments General comments (skin integrity, edema, etc.): Mother present at beginning of session and reported she has been doing PROM to all 4 extremities; she feels no assistance or reseistance with bil LEs      Pertinent Vitals/Pain Pain Assessment: Faces Faces Pain Scale: No hurt    Home Living                      Prior Function            PT Goals (current goals can now be found in the care  plan section) Acute Rehab PT Goals Patient Stated Goal: home per family Time For Goal Achievement: 01/09/19 Potential to Achieve Goals: Poor Progress towards PT goals: Progressing toward goals(slowly)    Frequency    Min 2X/week      PT Plan Current plan remains appropriate    Co-evaluation              AM-PAC PT "6 Clicks" Mobility   Outcome Measure  Help needed turning from your back to your side while in a flat bed without using bedrails?: Total Help needed moving from lying on your back to sitting on the side of a flat bed without using bedrails?: Total Help needed moving to and from a bed to a chair (including a wheelchair)?: Total Help needed standing up from a chair using your arms (e.g., wheelchair or bedside chair)?: Total Help needed to walk in  hospital room?: Total Help needed climbing 3-5 steps with a railing? : Total 6 Click Score: 6    End of Session   Activity Tolerance: Patient tolerated treatment well;Treatment limited secondary to medical complications (Comment) Patient left: in bed;with call bell/phone within reach;with bed alarm set(with prevalon boots donned) Nurse Communication: Need for lift equipment PT Visit Diagnosis: Other abnormalities of gait and mobility (R26.89);Muscle weakness (generalized) (M62.81)     Time: 8185-6314 PT Time Calculation (min) (ACUTE ONLY): 26 min  Charges:  $Therapeutic Exercise: 23-37 mins                      Jerolyn Center, PT Pager [phone]    Zena Amos 01/08/2019, 5:04 PM

## 2019-01-08 NOTE — Progress Notes (Signed)
[facility]  PROGRESS NOTE  Laura Radilla NWG:[id number] DOB: [birth year] DOA: 11/25/2018 PCP: [facility] Admit date - 12/02/2018   Admitting Physician Assunta Found, MD  Outpatient Primary MD for the patient is [facility] [place]  LOS - 81 Brief Narrative  23 year old autistic male with autism and known epilepsy followed by [facility] Neurology and nonverbal at baseline, with marked cognitive decline and gait dysfunction since head injury after seizure on 08/2018 attributed to hypoxic ischemic encephalopathy following prolonged seizure. Prior to that he had a much better quality of life in terms of independence with ADLs. He presented on 12/24/2018 with severe obstipation, lethargy and vomiting with decreased urinary output.   Admitted with working diagnosis of subacute shock with lactic acidosis from aspiration pneumonia, metabolic encephalopathy and ileus.  Hospital course complicated by  -persistent lactic acidosis -Wernicke's encephalopathy in setting of severe thiamine deficiency, confirmed on MRI 11/16 (given rapid decline in cognition and gait in addition to new onset of peripheral neuropathic symptoms since the onset of intractable N/V) -Adynamic ileus and hepatic steatosis, 11/28 on Ct abd/pelvis -GBS --given distal weaknesstreated with IVIG.   -Intubation for respiratory failure in setting of sepsis and inability to clear secretions (11/27 intubation, extubated 11/30) -Pressor support for hypotension, off on 11/30 -Frequent need for deep suctioning due to inability to clear secretions   Subjective  Mr.  Dulude overnight has had persistently worse elevated heart rate and seems more agitated. Mom at bedside A & P   Goals of care. Have had continued goals of care discussions with mom.  Patient was briefly made hospice care after last hospitalization around 11/10 as outpatient before representing to hospital on 11/14 due to  worsening mental status and decreased p.o. intake per mother. -Raymund's mother and I have discussed daily his poor prognosis and low likelihood of meaningful recovery to his previous neurologic baseline -Despite IVIG, vitamin supplementation, and treatment of reversible conditions,as well as nutritional support Gurkaran has remained essentially unresponsive and this may be his new baseline -She will continue to discuss with his father potentially considering comfort care, full scope of treatment currently -Appreciate Palliative care support -Continue goals of care discussions with mother  Hypotension, resolved.  Related to dehydration.  Improved with small bolus on 12/13.  Records are unremarkable.  Hemoglobin stable, white count improved.  Chest ray unremarkable.  Tolerating tube feeds.  Sinus tachycardia, worsening.  Initially reflex tachycardia related to hypotension above but now worse with what seems to be agitation? TSH wnl, BP stable,CXR is nonacute, leukocytosis is mild without fever. Has diminished rectal output and had prior ileus which was resolved on last KUB ( 12/6).Repeat KUB shows nonobstructive gas patter. Had PEG tube placed by IR on 12/8 and has been tolerating TF  - Abd xr shows contrast in colon but otherwise noncontributory -obtain CT abd for further clarity--GI saw previously (12/7) and recommended this imaging at that time if his leukocytosis persisted and to re-consult if any acute pathology found -Closely monitor, continue current beta-blocker  Acute on chronic hypoxemic respiratory failure, stable. Inability to clear secretions due to poor mentation, multifactorial neurologic weakness.  Completed treatment for aspiration pneumonia. Hx of OSA, intolerant of CPAP.  -Last evaluated by PCCM on 12/7 and very high risk for reintubation -Mother elects for DNI  -monitor in SDU, requiring frequent deep suctioning and Chest PT -high aspiration risk, NPO  Aspiration pneumonia.   Sepsis physiology resolved.  Blood cultures remain negative.  Afebrile over last 24 hours.  Completed previous course of cefepime, Flagyl (11/14-7/18, Unasyn 11/28-12/2) recent chest x-ray shows no overt opacities -Repeat blood cultures unremarkable so far -Completed 5 day course of Unasyn (restarted on 12/7-12/12) -Continue aspiration precautions as above -WBC stable  Hypokalemia, improved.  On daily potassium  -Check mag in am, continue 40 mEq daily potassium -Monitor BMP daily  Leukocytosis ,stable. Afebrile overnight. Repeat CXR shows no opacities. Unclear source of infection given no diarrhea, no change in abdominal exam and tolerating tube feeds ok.  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  Has had progressive weakness for several months with acute worsening in extension weakness starting month prior to admission.  Secondary to GBS + Wernicke's encephalopathy/B1 deficiency. Neurology consulted.Distal weakness began FIRST, followed by the nausea and vomiting. This, coupled with the elevated CSF protein, makes GBS the more likely cuprit, but his B1 was low at 28 on admission as well, keeping this in the differential. No change neurologically - Completed 5 day IVIG course and high dose thiamine -continue thiamine 100 mg daily and multivitamin -neurology recommends supportive care  Epilepsy history,stable. EEG neg for epileptic activity.  -Continue keppra and onfi, sz precautions -followed by [place] [facility] -[facility] follow up provided per family request on discharge  Adynamic Ileus. Resolved on previous Abd xr (12/7). No longer having vomiting, completed rx for aspiration pna, no longer having diarrhea ( C diff negative 12/6)GI assisted with care (following peripherally since 12/7). No  longer on magnesuim and erythromycin - maintain normal K and mg  Hypothyroidism, stable --TSH is improving from 10-- to 6 on repeat during hospital stay -Continue Synthroid  Hypertension, labile Had hypotensive episodes requiring IVF overnight on 12/14. -lisinopril with holding parameters  Severe malnutrition in context of acute illness/injury. -Continue tube feeds now at goal rate -Continue supplementing feeds   Family Communication  :  Mom updated at bedside on 12/15  Code Status :  Partial/Full scope/Do not intubate  Disposition Plan  :  Close monitoring inprogressive unit for deep suctioning for respiratory status, tachycardia,  monitor neuro status. Very guarded prognosis--getting ct abd  Consults  : Neurology, GI, PCCM, palliative  Procedures  :   10/16 NG tube placed per interventional radiology 11/15 Midline > 11/20 lumbar puncture 11/27 endotracheal intubation > 11/30 11/27 central line placement, right IJ > 12/1* 10/15 EGD: esophagitis 10/19: flex sig/disimpaction, 10/29: flex sig/disimpaction 12/30/18: vomiting, aspiration in setting of acute hypoxia, unresponsiveness. Xray with increased dilated loops SB, feeding tube in distal SB KUB 12/6--improved non=specific BGP, decreased distension 12/8: IR PEG tube placed  DVT Prophylaxis  :  Lovenox   Lab Results  Component Value Date   PLT 478 (H) 01/08/2019    Diet :  Diet Order            Diet NPO time specified  Diet effective now               Inpatient Medications Scheduled Meds: . chlorhexidine gluconate (MEDLINE KIT)  15 mL Mouth Rinse BID  . Chlorhexidine Gluconate Cloth  6 each Topical Q0600  . cloBAZam  60 mg Per Tube BID  . enoxaparin (LOVENOX) injection  40 mg Subcutaneous Q12H  . feeding supplement (PRO-STAT SUGAR FREE 64)  60 mL Per Tube BID  . levothyroxine  25 mcg Per Tube Q0600  .  lisinopril  40 mg Per NG tube Daily  . mouth rinse  15 mL Mouth Rinse q12n4p  . metoprolol tartrate  125  mg Per NG tube BID  . multivitamin  15 mL Per Tube Daily  . nystatin   Topical Daily  . pantoprazole sodium  40 mg Per Tube BID  . potassium chloride  40 mEq Per Tube Daily  . sodium chloride flush  10-40 mL Intracatheter Q12H  . sodium chloride flush  10-40 mL Intracatheter Q12H  . tamsulosin  0.4 mg Oral Daily  . thiamine injection  100 mg Intravenous Daily  . vitamin B-12  1,000 mcg Per Tube Daily   Continuous Infusions: . feeding supplement (VITAL 1.5 CAL) 1,000 mL (01/07/19 0211)  . levETIRAcetam 1,000 mg (01/08/19 1044)   PRN Meds:.acetaminophen (TYLENOL) oral liquid 160 mg/5 mL, fentaNYL (SUBLIMAZE) injection, ibuprofen, ipratropium-albuterol, labetalol, lip balm, LORazepam, metoprolol tartrate, ondansetron (ZOFRAN) IV, sodium chloride flush, sodium chloride flush, white petrolatum  Antibiotics  :   Anti-infectives (From admission, onward)   Start     Dose/Rate Route Frequency Ordered Stop   12/31/18 1100  Ampicillin-Sulbactam (UNASYN) 3 g in sodium chloride 0.9 % 100 mL IVPB     3 g 200 mL/hr over 30 Minutes Intravenous Every 6 hours 12/31/18 1000 01/05/19 0645   12/22/18 0900  ampicillin-sulbactam (UNASYN) 1.5 g in sodium chloride 0.9 % 100 mL IVPB  Status:  Discontinued     1.5 g 200 mL/hr over 30 Minutes Intravenous Every 6 hours 12/22/18 0851 12/22/18 0857   12/22/18 0900  Ampicillin-Sulbactam (UNASYN) 3 g in sodium chloride 0.9 % 100 mL IVPB     3 g 200 mL/hr over 30 Minutes Intravenous Every 6 hours 12/22/18 0857 12/26/18 2105   12/19/18 1400  erythromycin (EES) 400 MG/5ML suspension 500 mg  Status:  Discontinued     500 mg Per Tube Every 8 hours 12/19/18 1132 12/30/18 1013   12/18/18 1500  erythromycin 500 mg in sodium chloride 0.9 % 100 mL IVPB  Status:  Discontinued     500 mg 100 mL/hr over 60 Minutes Intravenous Every 8 hours 12/18/18 1449 12/19/18 1132   12/15/18 1400  erythromycin (EES) 400 MG/5ML suspension 400 mg  Status:  Discontinued     400 mg Per Tube  Every 8 hours 12/15/18 1012 12/15/18 1130   12/15/18 1400  erythromycin 500 mg in sodium chloride 0.9 % 100 mL IVPB  Status:  Discontinued     500 mg 100 mL/hr over 60 Minutes Intravenous Every 8 hours 12/15/18 1130 12/18/18 1449   12/09/18 0600  vancomycin (VANCOCIN) 1,250 mg in sodium chloride 0.9 % 250 mL IVPB  Status:  Discontinued     1,250 mg 166.7 mL/hr over 90 Minutes Intravenous Every 12 hours 11/28/2018 1539 12/10/18 1043   12/09/18 0000  ceFEPIme (MAXIPIME) 2 g in sodium chloride 0.9 % 100 mL IVPB  Status:  Discontinued     2 g 200 mL/hr over 30 Minutes Intravenous Every 8 hours 12/17/2018 1539 12/11/18 1801   12/09/18 0000  metroNIDAZOLE (FLAGYL) IVPB 500 mg  Status:  Discontinued     500 mg 100 mL/hr over 60 Minutes Intravenous Every 8 hours 12/20/2018 2146 12/11/18 1801   12/12/2018 1415  ceFEPIme (MAXIPIME) 2 g in sodium chloride 0.9 % 100 mL IVPB     2 g 200 mL/hr over 30 Minutes Intravenous  Once 11/26/2018 1411 12/02/2018 1538   12/05/2018 1415  metroNIDAZOLE (FLAGYL) IVPB 500 mg  500 mg 100 mL/hr over 60 Minutes Intravenous  Once 12/07/2018 1411 12/18/2018 1609   11/26/2018 1415  vancomycin (VANCOCIN) IVPB 1000 mg/200 mL premix  Status:  Discontinued     1,000 mg 200 mL/hr over 60 Minutes Intravenous  Once 12/20/2018 1411 12/12/2018 1414   12/14/2018 1415  vancomycin (VANCOCIN) 2,500 mg in sodium chloride 0.9 % 500 mL IVPB     2,500 mg 250 mL/hr over 120 Minutes Intravenous  Once 12/19/2018 1414 12/24/2018 1710       Objective   Vitals:   01/08/19 1139 01/08/19 1143 01/08/19 1455 01/08/19 1948  BP: (!) 133/59 140/69 (!) 100/48 (!) 116/39  Pulse: (!) 136 (!) 135 (!) 128 (!) 121  Resp:  20 20   Temp: 98 F (36.7 C) 98.2 F (36.8 C) 98.1 F (36.7 C) 99 F (37.2 C)  TempSrc: Oral Oral  Oral  SpO2: 100% 98% 99%   Weight:      Height:        SpO2: 99 % O2 Flow Rate (L/min): 2 L/min FiO2 (%): 28 %  Wt Readings from Last 3 Encounters:  01/08/19 (!) 142.2 kg  11/21/18 (!) 148.8 kg   11/08/18 (!) 148.8 kg     Intake/Output Summary (Last 24 hours) at 01/08/2019 2114 Last data filed at 01/08/2019 1500 Gross per 24 hour  Intake 10 ml  Output 2225 ml  Net -2215 ml    Physical Exam:  Obese, young male, occasionally groans/attempts to vocalize Opens eyes to voice and touch, tracks with eyes Does not follow commands (chronic),  Can move proximal bilateral upper extremities antigravity but not on command. Tends to move right arm more but has chronic distal weakness in upper extremities. No response to noxious stimuli in bilateral lower extremities. occasionally moves upper extremities against gravity, persistent weakness distally in all extremities, Diffuse rhonchi in upper airways, without respiratory distress, occasionally tries to cough with poor effort No appreciable new F.N deficits,  [place].AT, Tachycardic, No Gallops,Rubs or new Murmurs,  Abdominal binder in place, soft abdomen, diminished bowel sounds, PEG tube in place  I have personally reviewed the following:   Data Reviewed:  CBC Recent Labs  Lab 01/04/19 0640 01/05/19 0510 01/06/19 0440 01/07/19 0500 01/08/19 0500  WBC 9.8 13.2* 12.7* 14.5* 12.7*  HGB 10.1* 10.3* 9.9* 10.6* 10.5*  HCT 32.6* 33.3* 32.3* 33.9* 32.8*  PLT 409* 451* 459* 517* 478*  MCV 91.1 92.0 92.0 89.0 89.4  MCH 28.2 28.5 28.2 27.8 28.6  MCHC 31.0 30.9 30.7 31.3 32.0  RDW 17.2* 17.3* 17.5* 17.7* 18.3*    Chemistries  Recent Labs  Lab 01/03/19 0817 01/04/19 0640 01/05/19 1425 01/06/19 0440 01/07/19 0500 01/08/19 0500  NA 144 143 138 139 138 136  K 2.8* 3.8 4.4 4.4 4.1 4.5  CL 106 106 101 100 99 99  CO2 '28 28 29 26 28 27  '$ GLUCOSE 106* 103* 106* 111* 105* 101*  BUN '10 12 14 13 10 13  '$ CREATININE <0.30* 0.42* <0.30* <0.30* <0.30* <0.30*  CALCIUM 9.2 9.2 9.4 9.2 9.7 9.4  MG 1.8  --   --   --   --   --     ------------------------------------------------------------------------------------------------------------------ No results for input(s): CHOL, HDL, LDLCALC, TRIG, CHOLHDL, LDLDIRECT in the last 72 hours.  No results found for: HGBA1C ------------------------------------------------------------------------------------------------------------------ Recent Labs    01/06/19 1451  TSH 6.251*   ------------------------------------------------------------------------------------------------------------------ No results for input(s): VITAMINB12, FOLATE, FERRITIN, TIBC, IRON, RETICCTPCT in the last 72 hours.  Coagulation profile No results for input(s): INR, PROTIME in the last 168 hours.  No results for input(s): DDIMER in the last 72 hours.  Cardiac Enzymes No results for input(s): CKMB, TROPONINI, MYOGLOBIN in the last 168 hours.  Invalid input(s): CK ------------------------------------------------------------------------------------------------------------------    Component Value Date/Time   BNP 67.3 12/10/2018 0930    Micro Results Recent Results (from the past 240 hour(s))  C difficile quick scan w PCR reflex     Status: None   Collection Time: 12/30/18 11:50 AM   Specimen: STOOL  Result Value Ref Range Status   C Diff antigen NEGATIVE NEGATIVE Final   C Diff toxin NEGATIVE NEGATIVE Final   C Diff interpretation No C. difficile detected.  Final    Comment: Performed at [facility], [place] [address] [place], [place] [address]  Culture, blood (routine x 2)     Status: None   Collection Time: 01/02/19  9:03 AM   Specimen: BLOOD LEFT HAND  Result Value Ref Range Status   Specimen Description BLOOD LEFT HAND  Final   Special Requests   Final    BOTTLES DRAWN AEROBIC ONLY Blood Culture results may not be optimal due to an inadequate volume of blood received in culture bottles   Culture   Final    NO GROWTH 5 DAYS Performed at [facility], [place] [address], [place], [place] [address]    Report Status 01/07/2019 FINAL  Final  Culture, blood (routine x 2)     Status: None   Collection Time: 01/02/19  1:29 PM   Specimen: BLOOD  Result Value Ref Range Status   Specimen Description BLOOD LEFT FOOT  Final   Special Requests   Final    BOTTLES DRAWN AEROBIC ONLY Blood Culture adequate volume   Culture   Final    NO GROWTH 5 DAYS Performed at [facility], [place] [address] [place], [place] [address]    Report Status 01/07/2019 FINAL  Final    Radiology Reports DG Abd 1 View  Result Date: 12/30/2018 CLINICAL DATA:  Reason for exam: Vomiting, aspiration into airway Hx HTN, pre-diabetes, fatty liver, pancreatitis, pneumonia EXAM: ABDOMEN - 1 VIEW COMPARISON:  12/25/2018 FINDINGS: Feeding tube tip in the distal duodenum. Stomach appears decompressed. Multiple dilated small bowel loops in the mid abdomen, increased since previous. Scattered gas in the nondilated colon. CLINICAL DATA:  Reason for exam: Vomiting, aspiration into airway Hx HTN, pre-diabetes, fatty liver, pancreatitis, pneumonia EXAM: ABDOMEN - 1 VIEW COMPARISON:  12/25/2018 FINDINGS: Feeding tube tip in the distal duodenum. Stomach appears decompressed. Multiple dilated small bowel loops in the mid abdomen, increased since previous. Scattered gas in the nondilated colon. IMPRESSION: 1. Multiple dilated small bowel loops in the mid abdomen, increased since previous, suggesting obstruction or developing ileus. 2. Feeding tube tip in the distal duodenum.  Stomach  decompressed. Electronically Signed   By: Lucrezia Europe M.D.   On: 12/30/2018 09:43   DG Abd 1 View  Result Date: 12/24/2018 CLINICAL DATA:  Nasogastric tube placement. EXAM: ABDOMEN - 1 VIEW COMPARISON:  December 21, 2018. FINDINGS: The bowel gas pattern is normal. Nasogastric tube tip is seen in expected position of third portion of duodenum. No radio-opaque calculi or other significant radiographic abnormality are seen. IMPRESSION:  Nasogastric tube tip seen in expected position of third portion of duodenum. No evidence of bowel obstruction or ileus. Electronically Signed   By: Marijo Conception M.D.   On:  12/24/2018 14:39   DG Abd 1 View  Result Date: 12/16/2018 CLINICAL DATA:  Ileus. EXAM: ABDOMEN - 1 VIEW COMPARISON:  None. FINDINGS: There is a dilated loop of small bowel in the mid abdomen measuring approximately 3.6 cm. There is no obvious pneumatosis or free air. The patient's enteric tube is outside the field of view. IMPRESSION: Mildly dilated loop of small bowel in the mid abdomen is nonspecific and could represent an ileus or partial small bowel obstruction the appropriate clinical setting. Electronically Signed   By: Constance Holster M.D.   On: 12/16/2018 15:04   DG Abd 1 View  Result Date: 12/14/2018 CLINICAL DATA:  Nasogastric tube advancement. EXAM: ABDOMEN - 1 VIEW COMPARISON:  One view abdomen 12/12/2018 and 12/10/2018. CT 11/27/2018. FINDINGS: 0936 hours. Two views are submitted. The tip of the endotracheal tube is unchanged, overlying the mid stomach. The stomach and colon are mildly distended with gas. No significant small bowel distention identified. There is no evidence of free intraperitoneal air. Atelectasis is present at both lung bases. IMPRESSION: Stable location of the nasogastric tube. No evidence of bowel obstruction. Electronically Signed   By: Richardean Sale M.D.   On: 12/14/2018 09:53   DG Abd 1 View  Result Date: 12/10/2018 CLINICAL DATA:  23 year old male status post NG tube placement. EXAM: ABDOMEN - 1 VIEW COMPARISON:  Abdominal radiograph dated 12/10/2018. FINDINGS: Partially visualized enteric tube with tip and side-port over the gastric air. The osseous structures and soft tissues appear unremarkable. IMPRESSION: Enteric tube with tip and side-port over the gastric air. Electronically Signed   By: Anner Crete M.D.   On: 12/10/2018 14:41   CT THORACIC SPINE WO CONTRAST  Result Date:  12/14/2018 CLINICAL DATA:  Myelopathy, acute or progressive EXAM: CT THORACIC SPINE WITHOUT CONTRAST TECHNIQUE: Multidetector CT images of the thoracic were obtained using the standard protocol without intravenous contrast. COMPARISON:  None. FINDINGS: Alignment: Normal Vertebrae: Normal vertebra.  No fracture or mass. Paraspinal and other soft tissues: Negative for paraspinous mass or adenopathy. NG tube in place Left lower lobe small infiltrate most likely pneumonia. Fatty liver with hepatomegaly. Disc levels: Disc spaces are well preserved. No significant disc degeneration or spurring. Negative for spinal stenosis. IMPRESSION: Negative CT thoracic spine Left lower lobe infiltrate, probable pneumonia Electronically Signed   By: Franchot Gallo M.D.   On: 12/14/2018 20:48   CT LUMBAR SPINE WO CONTRAST  Result Date: 12/14/2018 CLINICAL DATA:  Myelopathy, acute or progressive. EXAM: CT LUMBAR SPINE WITHOUT CONTRAST TECHNIQUE: Multidetector CT imaging of the lumbar spine was performed without intravenous contrast administration. Multiplanar CT image reconstructions were also generated. COMPARISON:  None. FINDINGS: Segmentation: Normal Alignment: Normal Vertebrae: Negative for fracture or mass.  Normal vertebra. Paraspinal and other soft tissues: Negative for paraspinous mass or adenopathy. No soft tissue edema. Disc levels: Disc spaces maintained. No significant disc degeneration or spinal stenosis. IMPRESSION: Negative CT lumbar spine. Electronically Signed   By: Franchot Gallo M.D.   On: 12/14/2018 20:51   MR BRAIN W WO CONTRAST  Result Date: 12/10/2018 CLINICAL DATA:  Slowly progressive ataxia.  History of seizures. EXAM: MRI HEAD WITHOUT AND WITH CONTRAST TECHNIQUE: Multiplanar, multiecho pulse sequences of the brain and surrounding structures were obtained without and with intravenous contrast. CONTRAST:  31m GADAVIST GADOBUTROL 1 MMOL/ML IV SOLN COMPARISON:  Head CT 11/29/2018 FINDINGS: Brain: There  is considerable motion degradation. There is abnormal T2 signal and restricted diffusion in both medial thalami. The most likely explanation is  Wernicke encephalopathy. The remainder of the brain appears normal. No evidence of ischemic infarction, mass lesion, hemorrhage, hydrocephalus or extra-axial collection. No abnormal contrast enhancement is demonstrated. Vascular: Major vessels at the base of the brain show flow. Skull and upper cervical spine: Negative Sinuses/Orbits: Clear/normal Other: None IMPRESSION: Abnormal T2 signal and restricted diffusion in both medial thalami. 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 Electronically Signed   By: Nelson Chimes M.D.   On: 12/10/2018 17:16   MR CERVICAL SPINE W WO CONTRAST  Result Date: 12/10/2018 CLINICAL DATA:  Slowly progressive ataxia and seizures. EXAM: MRI CERVICAL SPINE WITHOUT AND WITH CONTRAST TECHNIQUE: Multiplanar and multiecho pulse sequences of the cervical spine, to include the craniocervical junction and cervicothoracic junction, were obtained without and with intravenous contrast. CONTRAST:  63m GADAVIST GADOBUTROL 1 MMOL/ML IV SOLN COMPARISON:  None. FINDINGS: The study suffers from considerable motion degradation. Alignment: Normal Vertebrae: Normal Cord: Normal Posterior Fossa, vertebral arteries, paraspinal tissues: See results of brain MRI. Paraspinal regions negative. Disc levels: Normal IMPRESSION: Motion degraded exam.  No abnormality seen in the cervical region. Electronically Signed   By: MNelson ChimesM.D.   On: 12/10/2018 17:20   MR THORACIC SPINE W WO CONTRAST  Result Date: 12/19/2018 CLINICAL DATA:  Quadriplegia EXAM: MRI THORACIC AND LUMBAR SPINE WITHOUT AND WITH CONTRAST TECHNIQUE: Multiplanar and multiecho pulse sequences of the thoracic and lumbar spine were obtained without and with intravenous contrast. CONTRAST:  176mGADAVIST GADOBUTROL 1 MMOL/ML IV SOLN COMPARISON:  CT thoracic lumbar 12/14/2018 FINDINGS: MRI  THORACIC SPINE FINDINGS Alignment:  Normal. Image quality degraded by moderate to extensive motion. Vertebrae: Negative for fracture or mass. Normal enhancement of the spine. Cord: Limited cord evaluation due to motion. No cord lesion or cord compression identified. Paraspinal and other soft tissues: Negative Disc levels: Negative for disc protrusion or spinal stenosis. MRI LUMBAR SPINE FINDINGS Segmentation:  Normal Motion degraded study. There is progressive motion on the study which becomes more severe on the axial images. Alignment:  Normal Vertebrae:  Negative for fracture or mass. Conus medullaris and cauda equina: Conus extends to the T12-L1 level. Conus and cauda equina appear normal. Paraspinal and other soft tissues: Negative for paraspinous mass or adenopathy. Normal soft tissue enhancement. Disc levels: No significant abnormality at L3-4 or above Mild disc bulging and mild facet degeneration L4-5 and L5-S1. Negative for disc protrusion or stenosis IMPRESSION: Motion degraded study, worse on the thoracic compared to the lumbar spine. No acute thoracic abnormality. Negative for fracture, infection, or spinal stenosis. No acute lumbar abnormality. Mild disc and facet degeneration L4-5 and L5-S1 without stenosis. Electronically Signed   By: ChFranchot Gallo.D.   On: 12/19/2018 12:05   MR Lumbar Spine W Wo Contrast  Result Date: 12/19/2018 CLINICAL DATA:  Quadriplegia EXAM: MRI THORACIC AND LUMBAR SPINE WITHOUT AND WITH CONTRAST TECHNIQUE: Multiplanar and multiecho pulse sequences of the thoracic and lumbar spine were obtained without and with intravenous contrast. CONTRAST:  1016mADAVIST GADOBUTROL 1 MMOL/ML IV SOLN COMPARISON:  CT thoracic lumbar 12/14/2018 FINDINGS: MRI THORACIC SPINE FINDINGS Alignment:  Normal. Image quality degraded by moderate to extensive motion. Vertebrae: Negative for fracture or mass. Normal enhancement of the spine. Cord: Limited cord evaluation due to motion. No cord lesion  or cord compression identified. Paraspinal and other soft tissues: Negative Disc levels: Negative for disc protrusion or spinal stenosis. MRI LUMBAR SPINE FINDINGS Segmentation:  Normal Motion degraded study. There is progressive motion on the study which becomes more severe  on the axial images. Alignment:  Normal Vertebrae:  Negative for fracture or mass. Conus medullaris and cauda equina: Conus extends to the T12-L1 level. Conus and cauda equina appear normal. Paraspinal and other soft tissues: Negative for paraspinous mass or adenopathy. Normal soft tissue enhancement. Disc levels: No significant abnormality at L3-4 or above Mild disc bulging and mild facet degeneration L4-5 and L5-S1. Negative for disc protrusion or stenosis IMPRESSION: Motion degraded study, worse on the thoracic compared to the lumbar spine. No acute thoracic abnormality. Negative for fracture, infection, or spinal stenosis. No acute lumbar abnormality. Mild disc and facet degeneration L4-5 and L5-S1 without stenosis. Electronically Signed   By: Franchot Gallo M.D.   On: 12/19/2018 12:05   CT ABDOMEN PELVIS W CONTRAST  Result Date: 12/22/2018 CLINICAL DATA:  Persistent ileus. EXAM: CT ABDOMEN AND PELVIS WITH CONTRAST TECHNIQUE: Multidetector CT imaging of the abdomen and pelvis was performed using the standard protocol following bolus administration of intravenous contrast. CONTRAST:  137m OMNIPAQUE IOHEXOL 300 MG/ML  SOLN COMPARISON:  12/19/2018 FINDINGS: Lower chest: Normal heart size. Airspace disease in both lower lungs, most confluent in the medial left lower lobe. Hepatobiliary: Hepatic steatosis which is diffuse and prominent.No evidence of biliary obstruction or stone. Pancreas: Unremarkable. Spleen: Unremarkable. Adrenals/Urinary Tract: Negative adrenals. No hydronephrosis or stone. Bladder is decompressed by Foley catheter. Stomach/Bowel: Fluid levels seen throughout small and large bowel with moderate distention of bowel  loops. A rectal tube is in place. Maximal colonic thickening is at the ascending segment-8.4 cm. No bowel wall thickening. New high-density within the appendix attributed to barium retention. There is mild widening of the midportion appendix that is stable, no periappendiceal inflammation. Enteric tube tip in good position at the proximal stomach. Vascular/Lymphatic: No acute vascular abnormality. No mass or adenopathy. Reproductive:Negative Other: No ascites or pneumoperitoneum. Musculoskeletal: No acute abnormalities. IMPRESSION: 1. Adynamic ileus pattern that has progressed from 11/28/2018. 2. Hepatic steatosis. 3. Pneumonia both lung bases, progressed from comparison. Electronically Signed   By: JMonte FantasiaM.D.   On: 12/22/2018 15:42   IR GASTROSTOMY TUBE MOD SED  Result Date: 01/02/2019 INDICATION: Dysphagia. Please place percutaneous gastrostomy tube for enteric nutrition supplementation purposes. EXAM: PULL TROUGH GASTROSTOMY TUBE PLACEMENT COMPARISON:  CT abdomen pelvis-12/22/2018 MEDICATIONS: Patient is currently admitted to the hospital and receiving intravenous antibiotics; Antibiotics were administered within 1 hour of the procedure. Glucagon 1 mg IV CONTRAST:  30 mL of Isovue-300 administered into the gastric lumen. ANESTHESIA/SEDATION: Moderate (conscious) sedation was employed during this procedure. A total of Versed 1 mg and Fentanyl 50 mcg was administered intravenously. Moderate Sedation Time: 10 minutes. The patient's level of consciousness and vital signs were monitored continuously by radiology nursing throughout the procedure under my direct supervision. FLUOROSCOPY TIME:  3 minutes, 30 seconds (89 mGy) COMPLICATIONS: None immediate. PROCEDURE: Informed written consent was obtained from the patient following explanation of the procedure, risks, benefits and alternatives. A time out was performed prior to the initiation of the procedure. Ultrasound scanning was performed to demarcate  the edge of the left lobe of the liver. Maximal barrier sterile technique utilized including caps, mask, sterile gowns, sterile gloves, large sterile drape, hand hygiene and Betadine prep. The left upper quadrant was sterilely prepped and draped. An oral gastric catheter was inserted into the stomach under fluoroscopy. The existing nasogastric feeding tube was removed. The left costal margin and barium/air opacified transverse colon were identified and avoided. Air was injected into the stomach for insufflation and visualization under  fluoroscopy. Under sterile conditions a 17 gauge trocar needle was utilized to access the stomach percutaneously beneath the left subcostal margin after the overlying soft tissues were anesthetized with 1% Lidocaine with epinephrine. Needle position was confirmed within the stomach with aspiration of air and injection of small amount of contrast. A single T tack was deployed for gastropexy. Over an Amplatz guide wire, a 9-French sheath was inserted into the stomach. A snare device was utilized to capture the oral gastric catheter. The snare device was pulled retrograde from the stomach up the esophagus and out the oropharynx. The 20-French pull-through gastrostomy was connected to the snare device and pulled antegrade through the oropharynx down the esophagus into the stomach and then through the percutaneous tract external to the patient. The gastrostomy was assembled externally. Contrast injection confirms position in the stomach. Several spot radiographic images were obtained in various obliquities for documentation. The patient tolerated procedure well without immediate post procedural complication. FINDINGS: After successful fluoroscopic guided placement, the gastrostomy tube is appropriately positioned with internal disc against the ventral aspect of the gastric lumen. IMPRESSION: Successful fluoroscopic insertion of a 20-French pull-through gastrostomy tube. The gastrostomy may  be used immediately for medication administration and in 24 hrs for the initiation of feeds. Electronically Signed   By: Sandi Mariscal M.D.   On: 01/02/2019 10:14   DG CHEST PORT 1 VIEW  Result Date: 01/05/2019 CLINICAL DATA:  Hypoxia, fever. EXAM: PORTABLE CHEST 1 VIEW COMPARISON:  December 31, 2018. FINDINGS: The heart size and mediastinal contours are within normal limits. Both lungs are clear. Right-sided PICC line is noted with tip in expected position of right atrium. No pneumothorax or pleural effusion is noted. The visualized skeletal structures are unremarkable. IMPRESSION: No active disease. Electronically Signed   By: Marijo Conception M.D.   On: 01/05/2019 11:25   DG CHEST PORT 1 VIEW  Result Date: 12/31/2018 CLINICAL DATA:  23 year old male with a history aspiration EXAM: PORTABLE CHEST 1 VIEW COMPARISON:  12/30/2018, 12/28/2018 FINDINGS: Cardiomediastinal silhouette unchanged in size and contour. Low lung volumes persist with linear opacities at the lung bases. No pneumothorax or large pleural effusion. Crowding of the interstitium and the vasculature. Enteric tube projects over the mediastinum and terminates out of the field of view. IMPRESSION: Unchanged appearance of the chest x-ray with low lung volumes and likely atelectasis. Unchanged enteric feeding tube. Electronically Signed   By: Corrie Mckusick D.O.   On: 12/31/2018 07:45   DG CHEST PORT 1 VIEW  Result Date: 12/30/2018 CLINICAL DATA:  Reason for exam: Vomiting, aspiration into airway Hx HTN, pre-diabetes, fatty liver, pancreatitis, pneumonia EXAM: PORTABLE CHEST - 1 VIEW COMPARISON:  12/28/2018 FINDINGS: Feeding tube remains in place. Persistent low lung volumes with coarse perihilar and bibasilar interstitial markings. No new infiltrate or edema. Heart size and mediastinal contours are within normal limits. No effusion. Visualized bones unremarkable. IMPRESSION: 1. Stable chest x-ray. No new infiltrate or edema. 2. Stable appearance  of the cardiomediastinal silhouette. Electronically Signed   By: Lucrezia Europe M.D.   On: 12/30/2018 09:42   DG CHEST PORT 1 VIEW  Result Date: 12/28/2018 CLINICAL DATA:  Updated status.  Fever. EXAM: PORTABLE CHEST 1 VIEW COMPARISON:  December 26, 2018 FINDINGS: The feeding tube terminates below today's film. Mild opacity in left base is somewhat streaky in platelike laterally. Low lung volumes. No pneumothorax. The lungs are otherwise clear. The cardiomediastinal silhouette is stable. IMPRESSION: Streaky opacity in left base is favored to represent  atelectasis given the platelike configuration laterally. Developing infiltrate not completely excluded. Recommend attention on follow-up. 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 EXAM: PORTABLE CHEST 1 VIEW COMPARISON:  Portable chest 12/21/2018. FINDINGS: Portable AP semi upright view at 0052 hours. Extubated and NG type tube removed since 12/21/2018. Lower lung volumes. Mediastinal contours remain normal. There is an enteric feeding tube in place which courses to the abdomen and probably crosses midline to the right but the tip is not included. Increased crowding of lung markings and/or pulmonary vascularity without overt edema. No pneumothorax, pleural effusion or consolidation. Stable visible bowel gas pattern. IMPRESSION: 1. Extubated with lower lung volumes. No other acute cardiopulmonary abnormality. 2. Enteric feeding tube courses to the abdomen, tip not included. Electronically Signed   By: Genevie Ann M.D.   On: 12/26/2018 01:08   DG CHEST PORT 1 VIEW  Result Date: 12/21/2018 CLINICAL DATA:  23 year old male with history of central line and nasogastric tube placement. EXAM: PORTABLE CHEST 1 VIEW COMPARISON:  Chest x-ray 12/21/2018. FINDINGS: An endotracheal tube is in place with tip 4.1 cm  above the carina. There is a right-sided internal jugular central venous catheter with tip terminating in the distal superior vena cava. Nasogastric tube extending into the proximal stomach with side port just distal to the gastroesophageal junction. No pneumothorax. Lung volumes are low. Ill-defined opacities in the medial aspect of the left lung base and medial aspect of the right upper lobe. No pleural effusions. No evidence of pulmonary edema. Heart size is normal. IMPRESSION: 1. Support apparatus, as above. 2. Ill-defined opacities in the medial left lower lobe and medial right upper lobe concerning for multilobar pneumonia. Electronically Signed   By: Vinnie Langton M.D.   On: 12/21/2018 17:22   DG CHEST PORT 1 VIEW  Result Date: 12/21/2018 CLINICAL DATA:  Aspiration, possible seizure EXAM: PORTABLE CHEST 1 VIEW COMPARISON:  12/20/2018 FINDINGS: No significant change in low volume AP portable examination without significant acute appearing airspace opacity. Esophagogastric tube remains with tip and side port below the diaphragm. Heart and mediastinum are unremarkable. IMPRESSION: No significant change in low volume portable chest x-ray. No new findings. Electronically Signed   By: Eddie Candle M.D.   On: 12/21/2018 11:32   DG CHEST PORT 1 VIEW  Result Date: 12/20/2018 CLINICAL DATA:  Fever. History of hypertension seizures and pancreatitis. EXAM: PORTABLE CHEST 1 VIEW COMPARISON:  12/16/2018 and older exams. FINDINGS: Lung volumes are low. Atelectasis in the medial lung bases. Lungs otherwise clear. No pleural effusion or pneumothorax. Cardiac silhouette normal in size.  No mediastinal or hilar masses. Nasogastric tube passes below the diaphragm well into the stomach. IMPRESSION: 1. No acute cardiopulmonary disease. Electronically Signed   By: Lajean Manes M.D.   On: 12/20/2018 13:39   DG CHEST PORT 1 VIEW  Result Date: 12/16/2018 CLINICAL DATA:  Shortness of breath. EXAM: PORTABLE CHEST 1  VIEW COMPARISON:  December 15, 2018. FINDINGS: Again noted are low lung volumes with bibasilar atelectasis. The cardiac silhouette remains enlarged. There is no pneumothorax. No large pleural effusion. No acute osseous abnormality. An enteric tube is noted. IMPRESSION: Stable appearance of the chest. Electronically Signed   By: Constance Holster M.D.   On: 12/16/2018 15:02   DG Chest Port 1 View  Result Date: 12/15/2018 CLINICAL DATA:  Respiratory failure. EXAM: PORTABLE CHEST 1 VIEW COMPARISON:  12/14/2018 FINDINGS: 0514 hours. Low lung volumes. Interval improvement in left basilar aeration. The lungs are clear without focal pneumonia, edema, pneumothorax or pleural effusion. Cardiopericardial silhouette accentuated by low volume AP technique. NG tube tip is in the mid stomach. Telemetry leads overlie the chest. IMPRESSION: Low volume film without acute cardiopulmonary findings. Electronically Signed   By: Misty Stanley M.D.   On: 12/15/2018 07:37   DG CHEST PORT 1 VIEW  Result Date: 12/14/2018 CLINICAL DATA:  Shortness of breath. EXAM: PORTABLE CHEST 1 VIEW COMPARISON:  Chest radiograph 12/10/2018, abdominal radiograph 12/12/2018 FINDINGS: Enteric tube is been retracted, tip now in the distal esophagus, side-port in the mid esophagus. Low lung volumes similar to prior exam. Slight increasing patchy opacities at the left lung base. Unchanged heart size and mediastinal contours. No pleural fluid or pneumothorax. IMPRESSION: 1. Enteric tube has been retracted, tip now in the distal esophagus, side-port in the mid esophagus. Recommend advancement of least 10 cm place the side-port below the diaphragm. 2. Persistent low lung volumes. Increasing patchy opacities at the left lung base, may be atelectasis or pneumonia. Electronically Signed   By: Keith Rake M.D.   On: 12/14/2018 05:22   DG CHEST PORT 1 VIEW  Result Date: 12/10/2018 CLINICAL DATA:  Dyspnea. EXAM: PORTABLE CHEST 1 VIEW COMPARISON:   December 09, 2018. FINDINGS: The heart size and mediastinal contours are within normal limits. No pneumothorax or pleural effusion is noted. Hypoinflation of the lungs is noted with minimal bibasilar subsegmental atelectasis. The visualized skeletal structures are unremarkable. IMPRESSION: Hypoinflation of the lungs with minimal bibasilar subsegmental atelectasis. Electronically Signed   By: Marijo Conception M.D.   On: 12/10/2018 09:17   DG CHEST PORT 1 VIEW  Result Date: 12/09/2018 CLINICAL DATA:  Tachypnea, low-grade fever EXAM: PORTABLE CHEST 1 VIEW COMPARISON:  12/19/2018 FINDINGS: Low lung volumes. Heart size is accentuated by the low volumes. Bibasilar atelectasis. No effusions. No acute bony abnormality. IMPRESSION: Low lung volumes, bibasilar atelectasis. Electronically Signed   By: Rolm Baptise M.D.   On: 12/09/2018 21:38   DG Abd Portable 1V  Result Date: 01/08/2019 CLINICAL DATA:  Gastrostomy tube. No bowel movement for several days. History of ileus. Tachycardia. EXAM: PORTABLE ABDOMEN - 1 VIEW COMPARISON:  01/02/2019 abdominal radiograph. FINDINGS: No dilated small bowel loops. Retained oral contrast noted throughout the colon, with interval transit of oral contrast at least to the sigmoid colon. Mild-to-moderate stool and gas in the colon. No evidence of pneumatosis or pneumoperitoneum. No radiopaque nephrolithiasis. Gastrostomy tube not well visualized on these radiographs presumably obscured by retained oral contrast in the colon. IMPRESSION: Nonobstructive bowel gas pattern. Retained oral contrast throughout the colon. Mild-to-moderate colonic stool and gas. Electronically Signed   By: Ilona Sorrel M.D.   On: 01/08/2019 14:26   DG Abd Portable 1V  Result Date: 01/02/2019 CLINICAL DATA:  Severe sepsis. EXAM: PORTABLE ABDOMEN - 1 VIEW COMPARISON:  12/30/2018 FINDINGS: Air is present throughout the colon. Contrast is present over the right colon, terminal ileum and appendix. Small caliber  probe projects over the rectum. Enteric tube is present with tip in the midline of the mid abdomen likely over the distal duodenum near the duodenal jejunal junction. Remainder the exam is unchanged IMPRESSION: 1.  Nonobstructive bowel gas pattern as described. 2. Enteric tube with tip unchanged just left of midline in the upper abdomen likely over the third/fourth portion of the duodenum. Electronically Signed  By: Marin Olp M.D.   On: 01/02/2019 08:03   DG Abd Portable 1V  Result Date: 12/30/2018 CLINICAL DATA:  Ileus EXAM: PORTABLE ABDOMEN - 1 VIEW COMPARISON:  Portable exam 1715 hours compared to 0906 hours FINDINGS: Tip of feeding tube projects over the third portion of duodenum approaching ligament of Treitz. Scattered gas throughout bowel, decreased from prior exam. No bowel dilatation or bowel wall thickening. Osseous structures unremarkable. No urinary tract calcifications. IMPRESSION: Nonspecific bowel gas pattern. Decreased gaseous distention of bowel since prior study. Electronically Signed   By: Lavonia Dana M.D.   On: 12/30/2018 19:20   DG Abd Portable 1V  Result Date: 12/25/2018 CLINICAL DATA:  Feeding tube placement. EXAM: PORTABLE ABDOMEN - 1 VIEW COMPARISON:  December 24, 2018. FINDINGS: The bowel gas pattern is normal. Distal tip of feeding tube is seen in expected position of proximal duodenum. No radio-opaque calculi or other significant radiographic abnormality are seen. IMPRESSION: Negative. Electronically Signed   By: Marijo Conception M.D.   On: 12/25/2018 16:06   DG Abd Portable 1V  Result Date: 12/21/2018 CLINICAL DATA:  Nasogastric tube placement. EXAM: PORTABLE ABDOMEN - 1 VIEW COMPARISON:  December 20, 2018. FINDINGS: Mildly dilated air-filled large and small bowel loops are noted which may represent ileus. Distal tip of nasogastric tube is seen in proximal stomach. IMPRESSION: Distal tip of nasogastric tube seen in proximal stomach. Mildly dilated air-filled large and  small bowel loops are noted which may represent ileus. Electronically Signed   By: Marijo Conception M.D.   On: 12/21/2018 17:22   DG Abd Portable 1V  Result Date: 12/20/2018 CLINICAL DATA:  NG placement EXAM: PORTABLE ABDOMEN - 1 VIEW COMPARISON:  Radiograph 12/20/2018 FINDINGS: Transesophageal tube tip and side port distal to the GE junction, curling in the left upper quadrant in the region of the gastric body. Atelectatic changes in the otherwise clear lung bases. Gaseous distention of what appears to be the colon without high-grade bowel obstruction. IMPRESSION: 1. Transesophageal tube tip and side port in the region of the gastric body. 2. Gaseous distention of the colon without high-grade bowel obstruction. Electronically Signed   By: Lovena Le M.D.   On: 12/20/2018 02:45   DG Abd Portable 1V  Result Date: 12/20/2018 CLINICAL DATA:  Check gastric catheter placement EXAM: PORTABLE ABDOMEN - 1 VIEW COMPARISON:  12/20/2018 FINDINGS: Gastric catheter has been advanced with the tip in the stomach. Proximal side port lies at the gastroesophageal junction. This could be advanced several cm. The lungs are clear. IMPRESSION: Gastric catheter as described. The proximal side port is not within the stomach and should be advanced several cm. Electronically Signed   By: Inez Catalina M.D.   On: 12/20/2018 01:50   DG Abd Portable 1V  Result Date: 12/20/2018 CLINICAL DATA:  NG tube placement EXAM: PORTABLE ABDOMEN - 1 VIEW COMPARISON:  12/16/2018 abdominal radiograph FINDINGS: The side port of the NG tube is in the distal esophagus. Recommend advancing by 7-10 cm. Lungs are clear. Nonobstructive bowel gas pattern. IMPRESSION: 1. Side-port of the nasogastric tube in the distal esophagus. Recommend advancing by 7-10 cm. 2. No active cardiopulmonary disease. Electronically Signed   By: Ulyses Jarred M.D.   On: 12/20/2018 00:41   DG Abd Portable 1V  Result Date: 12/12/2018 CLINICAL DATA:  NG tube placement.  EXAM: PORTABLE ABDOMEN - 1 VIEW COMPARISON:  Radiograph dated 12/10/2018 FINDINGS: NG tube has been inserted and the tip is in the midbody of  the stomach. The visualized bowel gas pattern is normal. No visible acute bone abnormality. IMPRESSION: NG tube in the body of the stomach. Electronically Signed   By: Lorriane Shire M.D.   On: 12/12/2018 21:18   DG Abd Portable 1V  Result Date: 12/10/2018 CLINICAL DATA:  Dyspnea, abdominal pain and distention EXAM: PORTABLE ABDOMEN - 1 VIEW COMPARISON:  12/02/2018 abdominal radiograph FINDINGS: There are mildly dilated small bowel loops throughout the abdomen measuring up to 3.9 cm diameter in the left abdomen, mildly decreased in caliber. No evidence of pneumatosis or pneumoperitoneum. No radiopaque nephrolithiasis. IMPRESSION: Diffuse mild small bowel dilatation, mildly decreased, which could represent either improving adynamic ileus or improving partial distal small bowel obstruction. Electronically Signed   By: Ilona Sorrel M.D.   On: 12/10/2018 09:19   DG Naso G Tube Plc W/Fl-No Rad  Result Date: 01/07/2019 There is no Radiologist interpretation  for this exam.  EEG adult  Result Date: 12/21/2018 Lora Havens, MD     12/21/2018 12:59 PM Patient Name:Aaron Vega XTG:[id number] Epilepsy Attending:Priyanka Barbra Sarks Referring Physician/Provider:Dr Kathrynn Speed Date: 12/21/2018 Duration: 23.02 minutes  Patient history:23 year old male with known epilepsy who was noted to have episode of extension of arm with eyes rolling upwards concerning for seizure.. EEG to evaluate for seizures.   Level of alertness:Awake  AEDs during EEG study:Keppra, Onfi  Technical aspects: This EEG study was done with scalp electrodes positioned according to the 10-20 International system of electrode placement. Electrical activity was acquired at a sampling rate of '500Hz'$  and reviewed with a high frequency filter of '70Hz'$  and a low frequency filter of '1Hz'$ . EEG  data were recorded continuously and digitally stored.  Description:During awake state, no clear posterior dominant rhythm was seen. EEG showed continuous generalized 5 to 6 Hz theta slowing admixed withan excessive amount of 15 to 18 Hz, 2-3 uV beta activity with irregular morphology distributed symmetrically and diffusely. Hyperventilation and photic stimulation were not performed.  Abnormality -Continuous slow, generalized -Excessive beta, generalized    IMPRESSION: This study is suggestive of mild to moderate diffuse encephalopathy, nonspecific to etiology. No seizures or epileptiform discharges were seen throughout the recording.  The excessive beta activity seen in the background is most likely due to the effect of benzodiazepine and is a benign EEG pattern.  Priyanka Barbra Sarks  Overnight EEG with video  Result Date: 12/22/2018 Lora Havens, MD     12/23/2018  9:31 AM Patient Name:Aaron Vega EVO:[id number] Epilepsy Attending:Priyanka Barbra Sarks Referring Physician/Provider:Dr Kathrynn Speed Duration: 12/21/2018 1231 to 12/22/2018 1231  Patient history:23 year old male with known epilepsy who was noted to have episode of extension of arm with eyes rolling upwards concerning for seizure. EEG to evaluate for seizures.   Level of alertness:Awake, asleep  AEDs during EEG study:Keppra, Onfi  Technical aspects: This EEG study was done with scalp electrodes positioned according to the 10-20 International system of electrode placement. Electrical activity was acquired at a sampling rate of '500Hz'$  and reviewed with a high frequency filter of '70Hz'$  and a low frequency filter of '1Hz'$ . EEG data were recorded continuously and digitally stored.  Description:During awake state, no clear posterior dominant rhythm was seen. Sleep was characterized by vertex waves, sleep spindles (12-'14Hz'$ ), maximal frontocentral.EEG showed continuous generalized 5 to 6 Hz theta slowing admixed withan  excessive amount of 15 to 18 Hz, 2-3 uV beta activity with irregular morphology distributed symmetrically and diffusely. Hyperventilation and photic stimulation were not performed.  Abnormality -Continuous slow, generalized -Excessive  beta, generalized   IMPRESSION: This study is suggestive of mild to moderate diffuse encephalopathy, nonspecific to etiology. No seizures or epileptiform discharges were seen throughout the recording.  The excessive beta activity seen in the background is most likely due to the effect of benzodiazepine and is a benign EEG pattern. Priyanka Barbra Sarks   Overnight EEG with video  Result Date: 12/11/2018 Lora Havens, MD     12/22/2018  8:54 AM Patient Name: Amanuel Sinkfield MRN: [id number] Epilepsy Attending: Lora Havens Referring Physician/Provider: Dr [name] Duration: 12/10/2018 1946 to 12/11/2018 1218  Patient history: 23 year old male with known epilepsy presented with altered mental status.  EEG to evaluate for seizures.   Level of alertness: Awake, asleep  AEDs during EEG study: Keppra, Onfi  Technical aspects: This EEG study was done with scalp electrodes positioned according to the 10-20 International system of electrode placement. Electrical activity was acquired at a sampling rate of '500Hz'$  and reviewed with a high frequency filter of '70Hz'$  and a low frequency filter of '1Hz'$ . EEG data were recorded continuously and digitally stored.  Description: During awake state, no clear posterior dominant rhythm was seen.  Sleep was characterized by vertex waves, sleep spindles (12 to 14 Hz), maximal frontocentral.  EEG showed continuous generalized 5 to 6 Hz theta slowing admixed with an excessive amount of 15 to 18 Hz, 2-3 uV beta activity with irregular morphology distributed symmetrically and diffusely.  Hyperventilation and photic stimulation were not performed.  Abnormality -Continuous slow, generalized -Excessive beta, generalized     IMPRESSION: This  study is suggestive of mild diffuse encephalopathy, nonspecific to etiology. No seizures or epileptiform discharges were seen throughout the recording.  The excessive beta activity seen in the background is most likely due to the effect of benzodiazepine and is a benign EEG pattern.  Lora Havens   ECHOCARDIOGRAM COMPLETE  Result Date: 12/12/2018   ECHOCARDIOGRAM REPORT   Patient Name:   Aaron Vega Date of Exam: 12/12/2018 Medical Rec #:  [id number]      Height:       71.0 in Accession #:    [id number]     Weight:       298.0 lb Date of Birth:  12-Nov-1995       BSA:          2.50 m Patient Age:    23 years       BP:           110/86 mmHg Patient Gender: M              HR:           124 bpm. Exam Location:  Inpatient Procedure: 2D Echo and Intracardiac Opacification Agent Indications:    Abnormal ECG 794.31/R94.31  History:        Patient has no prior history of Echocardiogram examinations.  Sonographer:    Clayton Lefort RDCS (AE) Referring Phys: [id number] [name]  Sonographer Comments: Technically difficult study due to poor echo windows, suboptimal apical window, suboptimal parasternal window and patient is morbidly obese. Image acquisition challenging due to patient body habitus. IMPRESSIONS  1. Left ventricular ejection fraction, by visual estimation, is 55 to 60%. The left ventricle has normal function. There is mildly increased left ventricular hypertrophy.  2. Definity contrast agent was given IV to delineate the left ventricular endocardial borders.  3. The left ventricle has no regional wall motion abnormalities.  4. Indeterminate diastolic filling due to E-A fusion.  5.  Global right ventricle has normal systolic function.The right ventricular size is normal. No increase in right ventricular wall thickness.  6. Left atrial size was normal.  7. Right atrial size was normal.  8. The mitral valve is normal in structure. No evidence of mitral valve regurgitation. No evidence of mitral stenosis.  9.  The tricuspid valve is normal in structure. Tricuspid valve regurgitation is not demonstrated. 10. The aortic valve is tricuspid. Aortic valve regurgitation is not visualized. No evidence of aortic valve sclerosis or stenosis. 11. TR signal is inadequate for assessing pulmonary artery systolic pressure. 12. Technically difficult study with poor acoustic windows. FINDINGS  Left Ventricle: Left ventricular ejection fraction, by visual estimation, is 55 to 60%. The left ventricle has normal function. Definity contrast agent was given IV to delineate the left ventricular endocardial borders. The left ventricle has no regional wall motion abnormalities. The left ventricular internal cavity size was the left ventricle is normal in size. There is mildly increased left ventricular hypertrophy. Indeterminate diastolic filling due to E-A fusion. Right Ventricle: The right ventricular size is normal. No increase in right ventricular wall thickness. Global RV systolic function is has normal systolic function. Left Atrium: Left atrial size was normal in size. Right Atrium: Right atrial size was normal in size Pericardium: There is no evidence of pericardial effusion. Mitral Valve: The mitral valve is normal in structure. No evidence of mitral valve stenosis by observation. No evidence of mitral valve regurgitation. Tricuspid Valve: The tricuspid valve is normal in structure. Tricuspid valve regurgitation is not demonstrated. Aortic Valve: The aortic valve is tricuspid. Aortic valve regurgitation is not visualized. The aortic valve is structurally normal, with no evidence of sclerosis or stenosis. Aortic valve mean gradient measures 5.0 mmHg. Aortic valve peak gradient measures 8.3 mmHg. Aortic valve area, by VTI measures 2.58 cm. Pulmonic Valve: The pulmonic valve was normal in structure. Pulmonic valve regurgitation is not visualized. Aorta: The aortic root is normal in size and structure. Venous: The inferior vena cava was  not well visualized. IAS/Shunts: No atrial level shunt detected by color flow Doppler.  LEFT VENTRICLE PLAX 2D LVIDd:         4.14 cm LVIDs:         3.35 cm LV PW:         1.38 cm LV IVS:        1.32 cm LVOT diam:     2.10 cm LV SV:         30 ml LV SV Index:   11.34 LVOT Area:     3.46 cm  RIGHT VENTRICLE RV Basal diam:  3.34 cm RV S prime:     23.10 cm/s TAPSE (M-mode): 2.8 cm LEFT ATRIUM             Index       RIGHT ATRIUM           Index LA diam:        4.00 cm 1.60 cm/m  RA Area:     18.00 cm LA Vol (A2C):   50.6 ml 20.26 ml/m RA Volume:   49.50 ml  19.82 ml/m LA Vol (A4C):   56.1 ml 22.46 ml/m LA Biplane Vol: 55.1 ml 22.06 ml/m  AORTIC VALVE AV Area (Vmax):    2.69 cm AV Area (Vmean):   2.33 cm AV Area (VTI):     2.58 cm AV Vmax:           144.00 cm/s AV Vmean:  104.000 cm/s AV VTI:            0.234 m AV Peak Grad:      8.3 mmHg AV Mean Grad:      5.0 mmHg LVOT Vmax:         112.00 cm/s LVOT Vmean:        70.000 cm/s LVOT VTI:          0.174 m LVOT/AV VTI ratio: 0.74  AORTA Ao Root diam: 2.20 cm  SHUNTS Systemic VTI:  0.17 m Systemic Diam: 2.10 cm  Loralie Champagne MD Electronically signed by Loralie Champagne MD Signature Date/Time: 12/12/2018/5:14:36 PM    Final    VAS [place] LOWER EXTREMITY VENOUS (DVT)  Result Date: 12/22/2018  Lower Venous Study Indications: Edema.  Risk Factors: None identified. Limitations: Body habitus and poor ultrasound/tissue interface. Comparison Study: No prior studies. Performing Technologist: Oliver Hum RVT  Examination Guidelines: A complete evaluation includes B-mode imaging, spectral Doppler, color Doppler, and power Doppler as needed of all accessible portions of each vessel. Bilateral testing is considered an integral part of a complete examination. Limited examinations for reoccurring indications may be performed as noted.  +---------+---------------+---------+-----------+----------+--------------+ RIGHT     CompressibilityPhasicitySpontaneityPropertiesThrombus Aging +---------+---------------+---------+-----------+----------+--------------+ CFV      Full           Yes      Yes                                 +---------+---------------+---------+-----------+----------+--------------+ SFJ      Full                                                        +---------+---------------+---------+-----------+----------+--------------+ FV Prox  Full                                                        +---------+---------------+---------+-----------+----------+--------------+ FV Mid   Full                                                        +---------+---------------+---------+-----------+----------+--------------+ FV DistalFull                                                        +---------+---------------+---------+-----------+----------+--------------+ PFV      Full                                                        +---------+---------------+---------+-----------+----------+--------------+ POP      Full           Yes      Yes                                 +---------+---------------+---------+-----------+----------+--------------+  PTV      Full                                                        +---------+---------------+---------+-----------+----------+--------------+ PERO     Full                                                        +---------+---------------+---------+-----------+----------+--------------+   +---------+---------------+---------+-----------+----------+--------------+ LEFT     CompressibilityPhasicitySpontaneityPropertiesThrombus Aging +---------+---------------+---------+-----------+----------+--------------+ CFV      Full           Yes      Yes                                 +---------+---------------+---------+-----------+----------+--------------+ SFJ      Full                                                         +---------+---------------+---------+-----------+----------+--------------+ FV Prox  Full                                                        +---------+---------------+---------+-----------+----------+--------------+ FV Mid   Full                                                        +---------+---------------+---------+-----------+----------+--------------+ FV DistalFull                                                        +---------+---------------+---------+-----------+----------+--------------+ PFV      Full                                                        +---------+---------------+---------+-----------+----------+--------------+ POP      Full           Yes      Yes                                 +---------+---------------+---------+-----------+----------+--------------+ PTV      Full                                                        +---------+---------------+---------+-----------+----------+--------------+   PERO     Full                                                        +---------+---------------+---------+-----------+----------+--------------+     Summary: Right: There is no evidence of deep vein thrombosis in the lower extremity. No cystic structure found in the popliteal fossa. Left: There is no evidence of deep vein thrombosis in the lower extremity. No cystic structure found in the popliteal fossa.  *See table(s) above for measurements and observations. Electronically signed by Curt Jews MD on 12/22/2018 at 9:30:31 AM.    Final    [place] EKG SITE RITE  Result Date: 12/31/2018 If Site Rite image not attached, placement could not be confirmed due to current cardiac rhythm.  DG FL GUIDED LUMBAR PUNCTURE  Result Date: 12/14/2018 CLINICAL DATA:  Lower extremity weakness, decreased PO intake, dehydration, concern for Guillain-Barre syndrome EXAM: DIAGNOSTIC LUMBAR PUNCTURE UNDER FLUOROSCOPIC GUIDANCE FLUOROSCOPY  TIME:  Fluoroscopy Time: 72 seconds Radiation Exposure Index (if provided by the fluoroscopic device): 395.4 mGy Number of Acquired Spot Images: 3 PROCEDURE: Informed consent was obtained from the patient prior to the procedure, including potential complications of headache, allergy, and pain. With the patient prone, the lower back was prepped with Betadine. 1% Lidocaine was used for local anesthesia. Lumbar puncture was performed at the L5-S1 level using a 20 gauge needle with return of initially blood tinged, subsequently clear CSF. Extremely low opening pressure, which was not measured. 10.5 ml of CSF were obtained for laboratory studies. The patient tolerated the procedure well and there were no apparent complications. During the procedure, the patient's NG tube started to withdraw. This was returned to the original position in the gastric cardia and advanced slightly into the mid body of the stomach. IMPRESSION: Successful fluoroscopic guided lumbar puncture at L5-S1. Patient's NG tube readjusted, as described above. Electronically Signed   By: Julian Hy M.D.   On: 12/14/2018 17:19     Time Spent in minutes  40     Desiree Hane M.D on 01/08/2019 at 9:14 PM  To page go to [email or web address] - password [facility]

## 2019-01-08 NOTE — Progress Notes (Signed)
  Speech Language Pathology Treatment: Dysphagia  Patient Details Name: Rowan Blaker MRN: [id number] DOB: Feb 11, 1995 Today's Date: 01/08/2019 Time: 9528-4132 SLP Time Calculation (min) (ACUTE ONLY): 27 min  Assessment / Plan / Recommendation Clinical Impression  Pt was encountered awake and alert with mother present at bedside.  RN reported that pt's LOA and ability to track people had improved today.  SLP completed oral care with suction swab and pt tolerated without difficulty.  He exhibited some difficulty closing his lips around the [place] despite verbal and tactile cues; however, he was able to follow his mother's verbal command to medialize and protrude his lips given extra time.  Pt was seen with trials of small ice chips following oral care and he exhibited good bolus acceptance with independent labial closure around the spoon and good attention to the bolus.  Though reduced, pt exhibited appropriate lingual movements in response to ice chip stimulus and he lateralized the bolus to his molars where he was able to masticate the ice chips.  Pt exhibited difficulty with AP transport of the bolus despite verbal and tactile cues and no swallow initiation was observed with any trials.  Boluses were eventually suctioned from the pt's oral cavity and he exhibited an immediate cough following 1 out of 4 ice chip trials.  Per mother report and prior ST notes, pt exhibited significant improvement in this tx session with oral phase movements in comparison to previous sessions.  Recommend continuation of NPO at this time with alternative means of nutrition and frequent oral care.  SLP will continue to follow up per POC.       HPI HPI: Kanishk Stroebel is a 23 y.o. male with medical history significant of autism (nonverbal), Tourettes, seizures, obesity, reflux esophagitis, hypertension constipation and sleep apnea. 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 Admitted for lethargy and vomiting. Found to have acute on chronic respiratory failure and intubated 11/27-11/30, ileus, HTN, tachycardia, aspiration, ascending weakness and encephalopathy. CXR No other acute cardiopulmonary abnormality. No other ST notes found.      SLP Plan  Continue with current plan of care       Recommendations  Diet recommendations: NPO Medication Administration: Via alternative means                Oral Care Recommendations: Oral care QID;Staff/trained caregiver to provide oral care Follow up Recommendations: 24 hour supervision/assistance SLP Visit Diagnosis: Dysphagia, unspecified (R13.10) Plan: Continue with current plan of care       GO               Colin Mulders M.S., [place] Office: [phone]  [place] 01/08/2019, 11:33 AM

## 2019-01-08 NOTE — Progress Notes (Addendum)
Attempted to do CPT with patient and Speech was with patient. Came back at a later time and MD was at bedside talking to mom. MD stated we can starting doing CPT BID. RT will start CPT at 20:00 tonight.

## 2019-01-08 NOTE — Progress Notes (Signed)
Patient NTS. Tolerated well with vitals stable. Small, tan, thick secretions obtained.

## 2019-01-09 ENCOUNTER — Inpatient Hospital Stay (HOSPITAL_COMMUNITY)

## 2019-01-09 DIAGNOSIS — J9621 Acute and chronic respiratory failure with hypoxia: Secondary | ICD-10-CM

## 2019-01-09 DIAGNOSIS — R52 Pain, unspecified: Secondary | ICD-10-CM

## 2019-01-09 LAB — CBC WITH DIFFERENTIAL/PLATELET
Abs Immature Granulocytes: 0.3 10*3/uL — ABNORMAL HIGH (ref 0.00–0.07)
Basophils Absolute: 0.1 10*3/uL (ref 0.0–0.1)
Basophils Relative: 0 %
Eosinophils Absolute: 0.1 10*3/uL (ref 0.0–0.5)
Eosinophils Relative: 1 %
HCT: 33.6 % — ABNORMAL LOW (ref 39.0–52.0)
Hemoglobin: 10.8 g/dL — ABNORMAL LOW (ref 13.0–17.0)
Immature Granulocytes: 2 %
Lymphocytes Relative: 10 %
Lymphs Abs: 1.7 10*3/uL (ref 0.7–4.0)
MCH: 28.6 pg (ref 26.0–34.0)
MCHC: 32.1 g/dL (ref 30.0–36.0)
MCV: 89.1 fL (ref 80.0–100.0)
Monocytes Absolute: 1.3 10*3/uL — ABNORMAL HIGH (ref 0.1–1.0)
Monocytes Relative: 7 %
Neutro Abs: 14 10*3/uL — ABNORMAL HIGH (ref 1.7–7.7)
Neutrophils Relative %: 80 %
Platelets: 548 10*3/uL — ABNORMAL HIGH (ref 150–400)
RBC: 3.77 MIL/uL — ABNORMAL LOW (ref 4.22–5.81)
RDW: 18.7 % — ABNORMAL HIGH (ref 11.5–15.5)
WBC: 17.4 10*3/uL — ABNORMAL HIGH (ref 4.0–10.5)
nRBC: 0 % (ref 0.0–0.2)

## 2019-01-09 LAB — COMPREHENSIVE METABOLIC PANEL
ALT: 65 U/L — ABNORMAL HIGH (ref 0–44)
AST: 62 U/L — ABNORMAL HIGH (ref 15–41)
Albumin: 3 g/dL — ABNORMAL LOW (ref 3.5–5.0)
Alkaline Phosphatase: 75 U/L (ref 38–126)
Anion gap: 12 (ref 5–15)
BUN: 23 mg/dL — ABNORMAL HIGH (ref 6–20)
CO2: 23 mmol/L (ref 22–32)
Calcium: 9.6 mg/dL (ref 8.9–10.3)
Chloride: 99 mmol/L (ref 98–111)
Creatinine, Ser: 0.6 mg/dL — ABNORMAL LOW (ref 0.61–1.24)
GFR calc Af Amer: >60 mL/min (ref >60)
GFR calc non Af Amer: >60 mL/min (ref >60)
Glucose, Bld: 128 mg/dL — ABNORMAL HIGH (ref 70–99)
Potassium: 4.7 mmol/L (ref 3.5–5.1)
Sodium: 134 mmol/L — ABNORMAL LOW (ref 135–145)
Total Bilirubin: 0.4 mg/dL (ref 0.3–1.2)
Total Protein: 6.7 g/dL (ref 6.5–8.1)

## 2019-01-09 LAB — BASIC METABOLIC PANEL
Anion gap: 12 (ref 5–15)
BUN: 14 mg/dL (ref 6–20)
CO2: 26 mmol/L (ref 22–32)
Calcium: 9.9 mg/dL (ref 8.9–10.3)
Chloride: 95 mmol/L — ABNORMAL LOW (ref 98–111)
Creatinine, Ser: 0.71 mg/dL (ref 0.61–1.24)
GFR calc Af Amer: >60 mL/min (ref >60)
GFR calc non Af Amer: >60 mL/min (ref >60)
Glucose, Bld: 112 mg/dL — ABNORMAL HIGH (ref 70–99)
Potassium: 3.8 mmol/L (ref 3.5–5.1)
Sodium: 133 mmol/L — ABNORMAL LOW (ref 135–145)

## 2019-01-09 LAB — CBC
HCT: 34.6 % — ABNORMAL LOW (ref 39.0–52.0)
Hemoglobin: 11.2 g/dL — ABNORMAL LOW (ref 13.0–17.0)
MCH: 28.6 pg (ref 26.0–34.0)
MCHC: 32.4 g/dL (ref 30.0–36.0)
MCV: 88.5 fL (ref 80.0–100.0)
Platelets: 573 10*3/uL — ABNORMAL HIGH (ref 150–400)
RBC: 3.91 MIL/uL — ABNORMAL LOW (ref 4.22–5.81)
RDW: 18.6 % — ABNORMAL HIGH (ref 11.5–15.5)
WBC: 15.6 10*3/uL — ABNORMAL HIGH (ref 4.0–10.5)
nRBC: 0 % (ref 0.0–0.2)

## 2019-01-09 LAB — LACTIC ACID, PLASMA: Lactic Acid, Venous: 2 mmol/L (ref 0.5–1.9)

## 2019-01-09 LAB — GLUCOSE, CAPILLARY: Glucose-Capillary: 118 mg/dL — ABNORMAL HIGH (ref 70–99)

## 2019-01-09 LAB — MAGNESIUM: Magnesium: 1.8 mg/dL (ref 1.7–2.4)

## 2019-01-09 IMAGING — DX DG CHEST 1V PORT
1 series · 1 of 1 positions shown · non-contrast
Comparison: [DATE]

CLINICAL DATA: Shortness of breath

EXAM:
PORTABLE CHEST 1 VIEW

[chest]
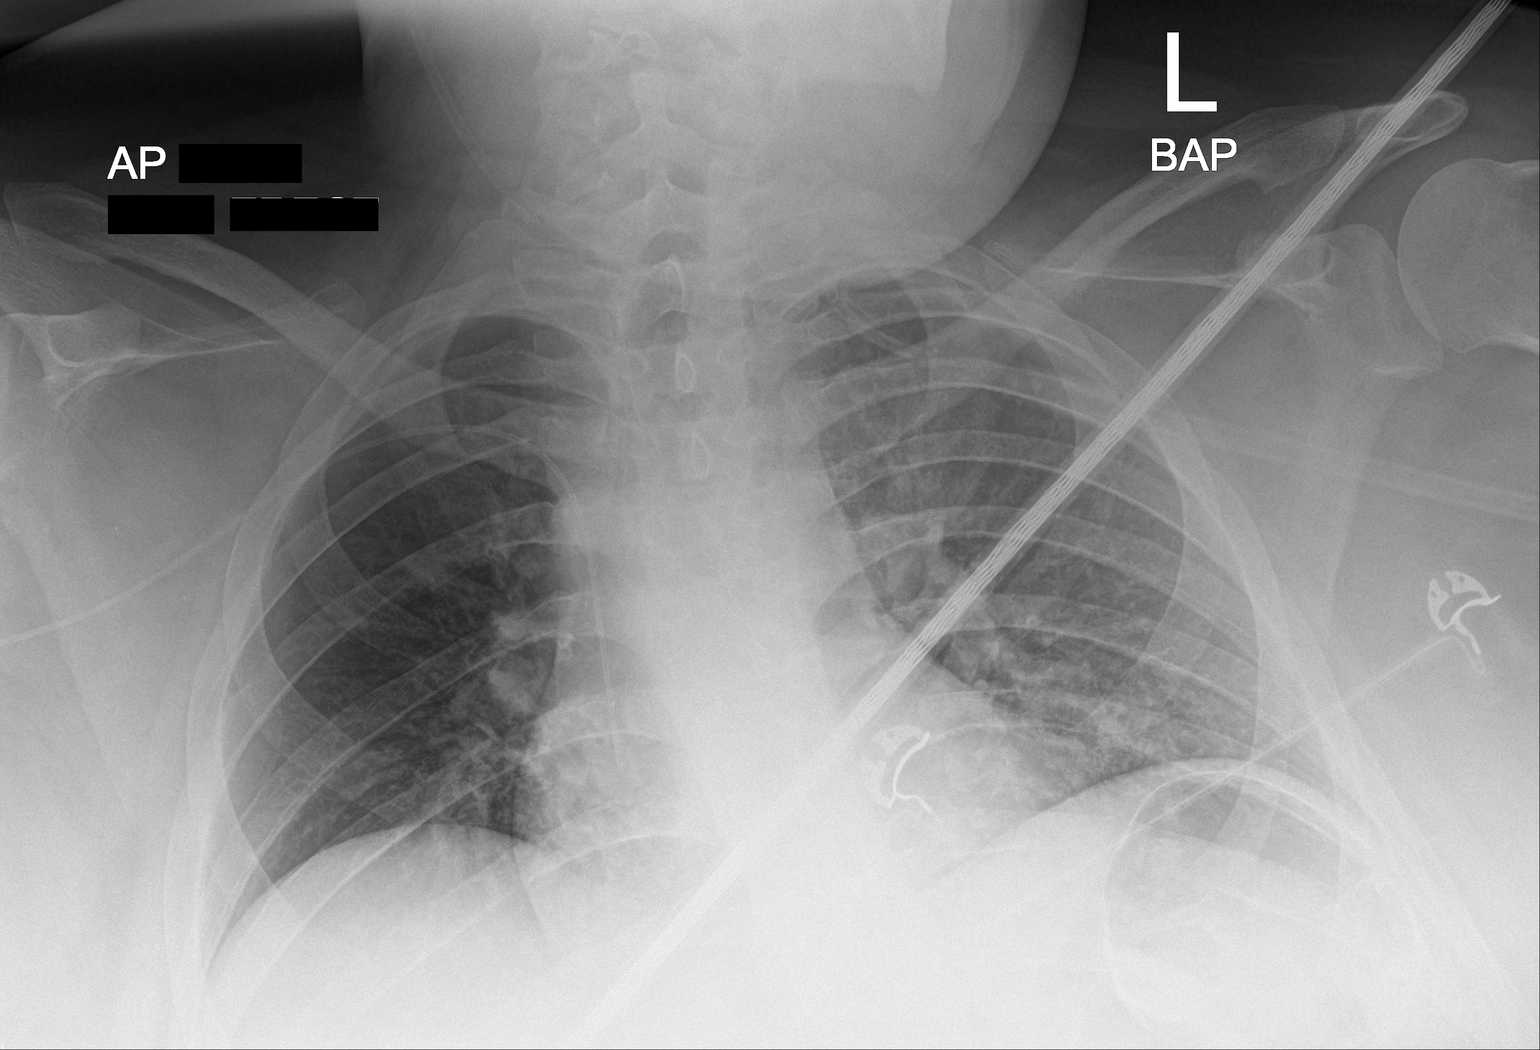

[1 of 1 positions shown; findings below may reference images not displayed]

FINDINGS: Right-sided PICC line remains in place with distal tip terminating
the level of the right atrium. Stable cardiomediastinal contours.
Lung volumes are low. Mild streaky left basilar opacity, new from
prior. Right lung is clear. No pleural effusion or pneumothorax.
IMPRESSION: Mild streaky left basilar opacity, new from prior, could reflect
atelectasis or infiltrate.

## 2019-01-09 MED ORDER — METOPROLOL TARTRATE 5 MG/5ML IV SOLN: 2.5000 mg | Freq: Four times a day (QID) | INTRAVENOUS | Status: DC | PRN

## 2019-01-09 MED ORDER — PIPERACILLIN-TAZOBACTAM 3.375 G IVPB
3.3750 g | Freq: Three times a day (TID) | INTRAVENOUS | Status: DC
  Administered 2019-01-09 – 2019-01-10 (×2): 3.375 g via INTRAVENOUS
  Filled 2019-01-09 (×4): qty 50

## 2019-01-09 MED ORDER — LEVALBUTEROL HCL 1.25 MG/0.5ML IN NEBU
1.2500 mg | INHALATION_SOLUTION | Freq: Once | RESPIRATORY_TRACT | Status: AC
  Administered 2019-01-09: 1.25 mg via RESPIRATORY_TRACT
  Filled 2019-01-09: qty 0.5

## 2019-01-09 MED ORDER — SODIUM CHLORIDE 0.9 % IV BOLUS
1000.0000 mL | Freq: Once | INTRAVENOUS | Status: AC
  Administered 2019-01-09: 1000 mL via INTRAVENOUS

## 2019-01-09 MED ORDER — MORPHINE SULFATE (PF) 2 MG/ML IV SOLN
1.0000 mg | INTRAVENOUS | Status: DC | PRN
  Administered 2019-01-09 – 2019-01-10 (×2): 1 mg via INTRAVENOUS
  Filled 2019-01-09 (×2): qty 1

## 2019-01-09 MED ORDER — SODIUM CHLORIDE 0.9 % IV BOLUS
500.0000 mL | Freq: Once | INTRAVENOUS | Status: AC
  Administered 2019-01-09: 21:00:00 500 mL via INTRAVENOUS

## 2019-01-09 MED ORDER — DEXTROSE-NACL 5-0.9 % IV SOLN: INTRAVENOUS | Status: DC

## 2019-01-09 MED ORDER — SODIUM CHLORIDE 0.9 % IV BOLUS
250.0000 mL | Freq: Once | INTRAVENOUS | Status: AC
  Administered 2019-01-09: 250 mL via INTRAVENOUS

## 2019-01-09 MED ORDER — LORAZEPAM 2 MG/ML IJ SOLN
0.5000 mg | INTRAMUSCULAR | Status: DC | PRN
  Administered 2019-01-10: 0.5 mg via INTRAVENOUS
  Filled 2019-01-09: qty 1

## 2019-01-09 MED ORDER — ACETAMINOPHEN 160 MG/5ML PO SOLN
650.0000 mg | Freq: Four times a day (QID) | ORAL | Status: DC | PRN
  Administered 2019-01-09 – 2019-01-26 (×16): 650 mg
  Filled 2019-01-09 (×18): qty 20.3

## 2019-01-09 MED ORDER — DIGOXIN 0.25 MG/ML IJ SOLN
0.1250 mg | Freq: Once | INTRAMUSCULAR | Status: AC
  Administered 2019-01-10: 0.125 mg via INTRAVENOUS
  Filled 2019-01-09: qty 2

## 2019-01-09 MED ORDER — SODIUM CHLORIDE 0.9 % IV BOLUS
500.0000 mL | Freq: Once | INTRAVENOUS | Status: AC
  Administered 2019-01-09: 500 mL via INTRAVENOUS

## 2019-01-09 NOTE — Progress Notes (Signed)
Pt BP 73/34 at 1315, rechecked 1325: 66/45, 1330: 73/34. Pt is arouseable and intermittently following commands which is Pt baseline. MD paged and 547ml bolus given BP 92/39 at 1415 and 110/51 at 1430.

## 2019-01-09 NOTE — Progress Notes (Signed)
Pharmacy Antibiotic Note  Aaron Vega is a 23 y.o. male admitted on 11/26/2018 with aspiration pneumonia.  Pharmacy has been consulted for Zosyn dosing. Patient has worsening leukocytosis, hypotension, and respiratory failure requiring BiPAP with new opacity on CXR.   Plan: Zosyn 3.375g IV every 8 hours - extended infusion.  Monitor renal function, any new culture results, and clinical status.  Follow-up ability to narrow.   Height: 5\' 11"  (180.3 cm) Weight: (!) 313 lb 7.9 oz (142.2 kg) IBW/kg (Calculated) : 75.3  Temp (24hrs), Avg:98.7 F (37.1 C), Min:97.8 F (36.6 C), Max:99.3 F (37.4 C)  Recent Labs  Lab 01/05/19 1425 01/06/19 0440 01/07/19 0500 01/08/19 0500 01/09/19 0500 01/09/19 2130  WBC  --  12.7* 14.5* 12.7* 15.6* 17.4*  CREATININE <0.30* <0.30* <0.30* <0.30* 0.71  --     Estimated Creatinine Clearance: 210.9 mL/min (by C-G formula based on SCr of 0.71 mg/dL).    No Known Allergies  Antimicrobials this admission: Vanc 11/14>>11/16 Cefepime 11/14>>11/17 Flagyl 11/14 >>11/17 Unasyn 11/28 >> 12/2, restart 12/7 >>12/12 Zosyn 12/16 >>  Dose adjustments this admission:   Microbiology results: 11/14 BCx - negative 11/14 UCx - negative 11/14 covid - negative 11/16 MRSA PCR - negative 11/26 BCx - negative 12/6 Cdiff - negative 12/9 BCx - negative Thank you for allowing pharmacy to be a part of this patient's care.  Sloan Leiter, PharmD, BCPS, BCCCP Clinical Pharmacist Please refer to [facility] for [place] numbers 01/09/2019 10:01 PM

## 2019-01-09 NOTE — Progress Notes (Signed)
Patient ID: Aaron Vega, male   DOB: September 02, 1995, 23 y.o.   MRN: [id number]  This NP visited patient at the bedside as a follow up for palliative medicine needs and emotional support.  Patient was originally seen for goals of care consult on 12/11/2018 by the PMT.  I initially met this patient and his mother on December 18, 2018.   Unfortunately the patient has had continued to decline in spite of aggressive medical intervention.  Created space and opportunity for patient's mother to explore thoughts and feelings regarding her current medical situation.  She tells me it has been "5 weeks" and " nothing seems to be getting better"  Active listening and presence.   Mother continues to verbalize hope for improvement but is more and more concerned with comfort and dignity.  Today she voices her concern for pain.  Patient  is unable to verbally communicate his needs. Mom see changes in facial expression and body movement. We discussed the difficulty in controlling pain in a person with hypotension and respiratory failure    Again spoke to the importance of understanding the limitations of medical interventions to prolong quality of life when a body fails to thrive.   Emotional support offered  Discussed with mother  the importance of continued conversation with the   medical providers regarding overall plan of care and treatment options,  ensuring decisions are within the context of the patients values and GOCs.  Questions and concerns addressed   Discussed with Dr Zigmund Daniel  Total time spent on the unit was 44 minutes  PMT will continue to support holistically.  Greater than 50% of the time was spent in counseling and coordination of care.  Aaron Lessen NP  Palliative Medicine Team Team Phone # [phone] Pager [phone]

## 2019-01-09 NOTE — Progress Notes (Addendum)
Shift event: NP colleague, Juanda Crumble, covering for 4N called this NP stating pt needed to be seen at bedside. Pt's BP had dropped to the 70s, was tachycardic, and tachypneic. CXR and bolus were ordered by Juanda Crumble.  NP to bedside.  Upon arrival, RN was on the phone updating [facility].  Per chart and RN, pt's BP was low this afternoon and his metoprolol per tube was held. He received a IVF bolus this afternoon and his BP came up to the 90s. New bolus infused prior to NP arrival and BP is still upper 70s.  Brief HPI: This is an unfortunate situation for a 23 yo autistic male with a PMHx of autism being non verbal at baseline. Pt has hx of epilepsy followed at [facility] and unfortunately has a head injury after a seizure in August 2020. 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 Prior to that he had a much better QOL with some independence with ADLs. He has had severe cognitive decline since the head injury. He was admitted on 01/03/2019 with severe obstipation, lethargy and vomiting with dehydration. Dx was subacute shock with lactic acidosis from aspiration PNA, metabolic encephalopathy and ileus. 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 Dysphagia and inability to clear secretions led to aspiration PNA and he was on Unasyn. Continues on TFs via PEG and is NPO.   S: pt is unable to participate in ROS due to mental status. Hx taken from chart, RN and mother at bedside. Per RN, tried to suction pt without success. Mental status is same per usual. Pt had neb tx.  O: Very poor appearing young male in mild respiratory distress. CBG 118. BP 70s. HR 130's, ST. RR 33 down to 29. SaO2 94% on ventimask due to mouth breathing. T 98.64F. Eyes open. Non verbal. Does not follow commands. He does reach  out for his mother's hand. Card: ST. S1S2. Resp: rhonchus throughout. Abd: soft, NT. Skin: warm and dry.  A/P: 1. Tachycardia-secondary to acute issue and likely rebound from not receiving Metoprolol today due to hypotension. Boluses should help. Hopefully, bipap will help as well when we calm his respiratory status down.  2. Hypotension-His BP has come up to 90 after boluses and when checked manually and with doppler. Per RRRN, his mental status is best he has seen. NP has not seen the pt before. Will continue to monitor.  Check LA. He is having UO. Spoke to mother about pressors. See note below.  3. Respiratory failure-has had this issue since admit. Has been intubated before. For now, we will turn off his tube feeds and trial bipap. See discussion below about code status.  4. Aspiration PNA-usually hesitant to use bipap in this setting but the pt is NPO and we have stopped TFs. CXR confirms opacity. Check CBC with diff to see if WBCC is worsening. Has already completed abx. If WBCC up and/or LA up, will restart abx. On chest PT.  5. Encephalopathy-apparently at baseline after head injury in August.  6. Epilepsy-chronic. On meds. Mom did say he was "tensing up" sometimes during the day today. Questionable seizure activity vs. Pain?  7. Palliative-has been consulted. Previously, pt was Hospice care, then mother changed to DNI. Now, mother wants FULL CODE again. NP had long conversation with mother about situation. We discussed what is going on acutely at  this moment and the grim possibility of a good outcome given his chronic issues accompanied by this long, complicated hospitalization. We discussed pt's "baseline" and how the son she knows will likely not return to the baseline he was prior to hospitalization. She understands that. We discussed various options for tx, including Bipap, intubation, pressors, ICU transfer, etc. Mother is struggling with knowing that she was "doing all she could" with causing  her son suffering. After discussion, she agreed to Bipap for now. She wants to continue to consider code status and intubation. For now, the pt is a FULL CODE. Mother asked that NP keep her informed if further escalation of treatment is needed, as NP feels she is leaning towards DNR/DNI and possibly even comfort care.  Update: WBCC increasing. Neutrophil count up as well. Will start Zosyn. Still awaiting LA and CMP.  Mother informed of CXR and restarting abx.  Update: Pt fighting the Bipap. Causing HR to climb. Not helping respiratory status. Pt is more upset with mask on. Spoke to mother again. After discussion, mother decided to stop Bipap and make pt a DNR/DNI. No pressors either. Mother agrees to agitation and pain medication as needed.  CRITICAL CARE Performed by: Leda Gauze Total critical care time: Start 2004  End 2115. Start 2130  End 2200.  For a total of 101 minutes.  Critical care time was exclusive of separately billable procedures and treating other patients. Critical care was necessary to treat or prevent imminent or life-threatening deterioration. Critical care was time spent personally by me on the following activities: development of treatment plan with patient and/or surrogate as well as nursing, discussions with consultants, evaluation of patient's response to treatment, examination of patient, obtaining history from patient or surrogate, ordering and performing treatments and interventions, ordering and review of laboratory studies, ordering and review of radiographic studies, pulse oximetry and re-evaluation of patient's condition.

## 2019-01-09 NOTE — Progress Notes (Addendum)
Rapid respond notified of patient change of status. Tachypneic, HR elevated, b/p 94/51, was given bolus earlier.  Paged [facility] of patient's status. HR 140s.    2004-[facility] [name],  here to assess patient. 250bolus is being given at this time. Xopenix treatment given. Patient mouth breathing 93% on oxygen at 2L. Sats 90%, b/p 78/39 (50). Temp 98.5 at this time. Blood sugar taken 118.   2011-Patient has intermittent respirations from 36-45. Patient is currently 93% on 02 at 2L still mouth breathing. Congestion and rhonchi noted to bilateral lungs. Attempted mouth suction. Nothing  suctioned. Spoke with respiratory about patient's status,   2016- [facility] here, updated on patient status.   2024- manual pressures taken on left arm 02/ systolic, unable to auscultate diastolic x3 Nurses attempt.   2040- Bipap applied by respiratory. HR 135- 98% on bipap, resp 40 at this time.  Percussion started at this time as well by respiratory.   2034-[name] updating mother of patient now.  2125-Labs drawn from picc line and sent to lab. Lactic acid sent on ice.   2220- Critical lab called back- lactic acid 2.0, [name] at bedside, made aware.  7741- Doppler systolic pressure taken 28/. Patient continues to move head around HR 140s. 02 sat 86-92% on room air. Taken off of bipap. 02 at 3L placed by nasal cannula. Picc line flushed and continues bolus.   2248-HR sustaining in 150s, patient cheeks flushed, Temp 100.9. Cooling blanket on. Talked and consoled mom. At bedside.   2303-Morphine given for patient comfort and resp distress. HR 150s, Temp 99.6. Resp 22-36  2338- Received call from telemetry on pateint HR hitting up to 150s-160s. Paged [name] about HR continuing to elevate. BP still running low in 80s/ morphine was given to patient a few minutes ago to help with resp distress.   2355-1Liter bolus ordered and started. Will continue to observe. Patient mildly agitated. Spoke with [name] about new orders. IV abt  still infusing at this time.  Digoxin given as ordered, witnessed by [name] at bedside. Will continue to monitor patient's vitals.   0027- Am giving oncoming nurse [name] update and report on this patient to resume care. Mother remains at bedside. Tearful.

## 2019-01-09 NOTE — Progress Notes (Signed)
  Speech Language Pathology Treatment: Dysphagia  Patient Details Name: Aaron Vega MRN: [id number] DOB: 12-03-1995 Today's Date: 01/09/2019 Time: 3419-6222 SLP Time Calculation (min) (ACUTE ONLY): 32 min  Assessment / Plan / Recommendation Clinical Impression  Pt was encountered awake and alert with mother present at bedside.  Pt with similar presentation to yesterday's treatment session.  He demonstrated good ability to track and attend to SLP.  SLP completed oral care with suction swab and pt tolerated without difficulty.  Pt was seen with trials of small ice chips, 1/2 tsp of thin liquid, and 1/2 tsp of puree following oral care.   He exhibited good bolus acceptance with observed head movement towards the spoon and labial closure around the spoon given minimal cues.  Pt with reduced but present lateral lingual movements with all trials and he was able to lateralize ice chip trials to his molars for mastication.  Pt continued to exhibit difficulty with AP transport of the boluses despite verbal and tactile cues and no swallow initiation was observed with any trials.  He exhibited R anterior loss from his oral cavity with the thin liquid trial and oral residue was observed in the pt's R buccal cavity following po trials.  Pt exhibited a delayed cough following thin liquid and puree trials and remaining boluses were suctioned from the pt's oral cavity secondary to aspiration risk.  SLP educated and answered questions from pt's mother regarding his current swallow function.  Recommend continuation of NPO at this time with alternative means of nutrition and frequent oral care.  SLP will continue to follow up per POC.    HPI HPI: Aaron Vega is a 23 y.o. male with medical history significant of autism (nonverbal), Tourettes, seizures, obesity, reflux esophagitis, hypertension constipation and sleep apnea. 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 Admitted for lethargy and vomiting. Found to have acute on chronic respiratory failure and intubated 11/27-11/30, ileus, HTN, tachycardia, aspiration, ascending weakness and encephalopathy. CXR No other acute cardiopulmonary abnormality. No other ST notes found.      SLP Plan  Continue with current plan of care       Recommendations  Diet recommendations: NPO Medication Administration: Via alternative means                Oral Care Recommendations: Oral care QID;Staff/trained caregiver to provide oral care Follow up Recommendations: 24 hour supervision/assistance SLP Visit Diagnosis: Dysphagia, unspecified (R13.10) Plan: Continue with current plan of care       GO               Colin Mulders M.S., [place] Office: [phone]  [place] 01/09/2019, 10:30 AM

## 2019-01-09 NOTE — Progress Notes (Signed)
PROGRESS NOTE    Aaron Vega  YKZ:[id number] DOB: [birth year] DOA: 11/27/2018 PCP: [facility]   Brief Narrative: 43 year oldautisticmalewith autism and known epilepsy followed by [facility] Neurology and nonverbal at baseline, with marked cognitive decline and gait dysfunction since head injury after seizure on 08/2018 attributed to hypoxic ischemic encephalopathy following prolonged seizure. Prior to that he had a much better quality of life in terms of independence with ADLs. He presented on 12/20/2018 with severe obstipation, lethargy and vomiting with decreased urinary output.   Admitted with working diagnosis of subacute shock with lactic acidosis from aspiration pneumonia, metabolic encephalopathy and ileus.  Hospital course complicated by  -persistent lactic acidosis -Wernicke's encephalopathy in setting of severe thiamine deficiency, confirmed on MRI 11/16 (given rapid declinein cognition and gait in addition to new onset of peripheral neuropathic symptoms since the onset of intractable N/V) -Adynamic ileus and hepatic steatosis, 11/28 on Ct abd/pelvis -GBS --given distal weaknesstreated with IVIG.   -Intubation for respiratory failure in setting of sepsis and inability to clear secretions (11/27 intubation, extubated 11/30) -Pressor support for hypotension, off on 11/30 -Frequent need for deep suctioning due to inability to clear secretions  Assessment & Plan:   Principal Problem:   Severe sepsis ([place]) Active Problems:   Autistic disorder   Intractable nausea and vomiting   Seizure disorder (HCC)   Constipation in male   Obesity, Class III, BMI 40-49.9 (morbid obesity) ([place])   Ileus (HCC)   Aspiration pneumonia ([place])   Community acquired pneumonia of left lower lobe of lung   Goals of care, counseling/discussion   Palliative care by specialist   DNR (do not resuscitate) discussion   Protein-calorie malnutrition, severe   Weakness of lower  extremity   Aspiration into airway   Pressure injury of skin   Fever   Diarrhea   Wernicke's encephalopathy   GBS (Guillain Barre syndrome) ([place])   Dysphagia   Do not intubate but use all other measures   Leukocytosis   Sinus tachycardia   Hypotension  #1 acute on chronic hypoxic respiratory failure secondary to aspiration pneumonia.  Patient now on 2 L of oxygen.  Completed treatment for aspiration pneumonia.  Patient has obstructive sleep apnea and does not tolerate CPAP.  Patient requiring frequent deep suctioning and chest PT high risk for aspiration patient is n.p.o. on tube feeds.  #2 sinus tachycardia patient remains tachycardic overnight.  He is also hypotensive today compared to the previous days.  Normal saline bolus ordered.  #3 status post PEG by interventional radiology 12 8 tolerating tube feeds.  #4 hypotension fluid boluses ordered.  Patient has no fever white count is improved.  He does not appear to be in any pain.  DC lisinopril.  #5 subacute neuropathy secondary to Guillain-Barr syndrome versus vitamin B1 deficiency followed by neurology received IVIG 5-day course with no significant improvement.  He also received high-dose thiamine.  #6 chronic epilepsy on Keppra and Onfi  #7 adynamic ileus stable  #8 severe malnutrition continue tube feeds.  #8 hypothyroidism continue Synthroid stable  #9 goals of care palliative care support appreciated.  Continued to discuss with mother regarding poor prognosis and meaningful recovery in spite of maximizing treatments.   Pressure Injury 12/20/18 Buttocks Right Deep Tissue Injury - Purple or maroon localized area of discolored intact skin or blood-filled blister due to damage of underlying soft tissue from pressure and/or shear. pink blanchable skin with small area in mid (Active)  12/20/18 1200  Location: Buttocks  Location  Orientation: Right  Staging: Deep Tissue Injury - Purple or maroon localized area of discolored  intact skin or blood-filled blister due to damage of underlying soft tissue from pressure and/or shear.  Wound Description (Comments): pink blanchable skin with small area in middle that is purple and nonblanchable  Present on Admission: Yes      Nutrition Problem: Severe Malnutrition Etiology: acute illness     Signs/Symptoms: energy intake < or equal to 50% for > or equal to 5 days, percent weight loss    Interventions: Tube feeding  Estimated body mass index is 43.72 kg/m as calculated from the following:   Height as of this encounter: _0  (1.803 m).   Weight as of this encounter: 142.2 kg.  DVT prophylaxis Lovenox  code Status: Partial DO NOT INTUBATE Family Communication: Called mother on her cell phone did not pick up  disposition Plan: Pending clinical improvement Consultants:   PCCM, GI, palliative care, neurology  Procedures: 10/16 NG tube placed per interventional radiology 11/15 Midline > 11/20 lumbar puncture 11/27 endotracheal intubation > 11/30 11/27 central line placement, right IJ > 12/1* 10/15 EGD: esophagitis 10/19: flex sig/disimpaction, 10/29: flex sig/disimpaction 12/30/18: vomiting, aspiration in setting of acute hypoxia, unresponsiveness. Xray with increased dilated loops SB, feeding tube in distal SB KUB 12/6--improved non=specific BGP, decreased distension 12/8: IR PEG tube placed Antimicrobials none Subjective: Awake eyes open resting in bed PICC line in right upper extremity keeps putting right upper extremity out of bed nonverbal  Objective: Vitals:   01/09/19 0746 01/09/19 0901 01/09/19 0932 01/09/19 1200  BP: (!) 103/48 109/71  125/66  Pulse: (!) 124 (!) 147 (!) 123 (!) 110  Resp: (!) 24  (!) 24 (!) 29  Temp: 99.1 F (37.3 C)   98.9 F (37.2 C)  TempSrc: Rectal   Rectal  SpO2: 96%  97% 94%  Weight:      Height:        Intake/Output Summary (Last 24 hours) at 01/09/2019 1405 Last data filed at 01/09/2019 0300 Gross per 24  hour  Intake 20 ml  Output 2130 ml  Net -2110 ml   Filed Weights   01/07/19 0500 01/08/19 0500 01/09/19 0500  Weight: (!) 142.1 kg (!) 142.2 kg (!) 142.2 kg    Examination:  General exam: Appears calm and comfortable  Respiratory system: Clear to auscultation. Respiratory effort normal. Cardiovascular system: S1 & S2 heard, RRR. No JVD, murmurs, rubs, gallops or clicks. No pedal edema. Gastrointestinal system: Abdomen is nondistended, soft and nontender. No organomegaly or masses felt. Normal bowel sounds heard.  PEG tube in place. Central nervous system: Nonverbal  extremities: Trace bilateral pitting edema. Skin: No rashes, lesions or ulcers Psychiatry: Unable to assess   Data Reviewed: I have personally reviewed following labs and imaging studies  CBC: Recent Labs  Lab 01/05/19 0510 01/06/19 0440 01/07/19 0500 01/08/19 0500 01/09/19 0500  WBC 13.2* 12.7* 14.5* 12.7* 15.6*  HGB 10.3* 9.9* 10.6* 10.5* 11.2*  HCT 33.3* 32.3* 33.9* 32.8* 34.6*  MCV 92.0 92.0 89.0 89.4 88.5  PLT 451* 459* 517* 478* 973*   Basic Metabolic Panel: Recent Labs  Lab 01/03/19 0817 01/05/19 1425 01/06/19 0440 01/07/19 0500 01/08/19 0500 01/09/19 0500  NA 144 138 139 138 136 133*  K 2.8* 4.4 4.4 4.1 4.5 3.8  CL 106 101 100 99 99 95*  CO2 _1 GLUCOSE 106* 106* 111* 105* 101* 112*  BUN _2 14  CREATININE <0.30* <0.30* <0.30* <0.30* <0.30* 0.71  CALCIUM 9.2 9.4 9.2 9.7 9.4 9.9  MG 1.8  --   --   --   --  1.8   GFR: Estimated Creatinine Clearance: 210.9 mL/min (by C-G formula based on SCr of 0.71 mg/dL). Liver Function Tests: No results for input(s): AST, ALT, ALKPHOS, BILITOT, PROT, ALBUMIN in the last 168 hours. No results for input(s): LIPASE, AMYLASE in the last 168 hours. No results for input(s): AMMONIA in the last 168 hours. Coagulation Profile: No results for input(s): INR, PROTIME in the last 168 hours. Cardiac Enzymes: No results for input(s):  CKTOTAL, CKMB, CKMBINDEX, TROPONINI in the last 168 hours. BNP (last 3 results) No results for input(s): PROBNP in the last 8760 hours. HbA1C: No results for input(s): HGBA1C in the last 72 hours. CBG: Recent Labs  Lab 01/07/19 1219 01/07/19 1635 01/08/19 0735 01/08/19 1147 01/08/19 1742  GLUCAP 101* 100* 90 105* 117*   Lipid Profile: No results for input(s): CHOL, HDL, LDLCALC, TRIG, CHOLHDL, LDLDIRECT in the last 72 hours. Thyroid Function Tests: Recent Labs    01/06/19 1451  TSH 6.251*   Anemia Panel: No results for input(s): VITAMINB12, FOLATE, FERRITIN, TIBC, IRON, RETICCTPCT in the last 72 hours. Sepsis Labs: No results for input(s): PROCALCITON, LATICACIDVEN in the last 168 hours.  Recent Results (from the past 240 hour(s))  Culture, blood (routine x 2)     Status: None   Collection Time: 01/02/19  9:03 AM   Specimen: BLOOD LEFT HAND  Result Value Ref Range Status   Specimen Description BLOOD LEFT HAND  Final   Special Requests   Final    BOTTLES DRAWN AEROBIC ONLY Blood Culture results may not be optimal due to an inadequate volume of blood received in culture bottles   Culture   Final    NO GROWTH 5 DAYS Performed at [facility], [place] [address], [place], [place] [address]    Report Status 01/07/2019 FINAL  Final  Culture, blood (routine x 2)     Status: None   Collection Time: 01/02/19  1:29 PM   Specimen: BLOOD  Result Value Ref Range Status   Specimen Description BLOOD LEFT FOOT  Final   Special Requests   Final    BOTTLES DRAWN AEROBIC ONLY Blood Culture adequate volume   Culture   Final    NO GROWTH 5 DAYS Performed at [facility], [place] [address] [place], [place] [address]    Report Status 01/07/2019 FINAL  Final         Radiology Studies: CT ABDOMEN PELVIS W CONTRAST  Result Date: 01/08/2019 CLINICAL DATA:  Intestinal dysmotility. Persistent leukocytosis. Decreased stool output. EXAM: CT ABDOMEN AND PELVIS WITH CONTRAST  TECHNIQUE: Multidetector CT imaging of the abdomen and pelvis was performed using the standard protocol following bolus administration of intravenous contrast. CONTRAST:  169m OMNIPAQUE IOHEXOL 300 MG/ML  SOLN COMPARISON:  December 22, 2018 FINDINGS: Lower chest: The lung bases are clear. The heart size is normal. Hepatobiliary: There is decreased hepatic attenuation suggestive of hepatic steatosis. Normal gallbladder.There is no biliary ductal dilation. Pancreas: Normal contours without ductal dilatation. No peripancreatic fluid collection. Spleen: No splenic laceration or hematoma. Adrenals/Urinary Tract: --Adrenal glands: No adrenal hemorrhage. --Right kidney/ureter: No hydronephrosis or perinephric hematoma. --Left kidney/ureter: No hydronephrosis or perinephric hematoma. --Urinary bladder: The bladder is decompressed with a Foley catheter Stomach/Bowel: --Stomach/Duodenum: There is a new percutaneous gastrostomy tube in place. The tube appears well position.  There is some mild fat stranding about the gastrostomy tube tract site without evidence for a well-formed drainable fluid collection. --Small bowel: There are mildly dilated loops of fluid-filled small bowel throughout the abdomen without evidence for high-grade obstruction. --Colon: There is a rectal tube in place. Oral contrast is noted throughout the colon. The colon is relatively decompressed when compared to the prior CT. --Appendix: Normal. 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 The aorta is unremarkable. --No retroperitoneal lymphadenopathy. --No mesenteric lymphadenopathy. --No pelvic or inguinal lymphadenopathy. Reproductive: Unremarkable Other: No ascites or free air. The abdominal wall is normal. Musculoskeletal. No acute displaced fractures. IMPRESSION: 1. There is a new percutaneous gastrostomy tube in place. The tube appears well position. There is some mild fat stranding about the gastrostomy tube tract  without evidence for a well-formed drainable fluid collection. Findings may be secondary to the recent intervention versus a developing soft tissue infection. 2. There are mildly dilated loops of fluid-filled small bowel throughout the abdomen without evidence for high-grade obstruction. Oral contrast is noted throughout the colon. The colon is relatively decompressed when compared to the prior CT. 3. Hepatic steatosis. 4. Incidentally noted duplicated IVC, a normal variant. Electronically Signed   By: Constance Holster M.D.   On: 01/08/2019 22:34   DG Abd Portable 1V  Result Date: 01/08/2019 CLINICAL DATA:  Gastrostomy tube. No bowel movement for several days. History of ileus. Tachycardia. EXAM: PORTABLE ABDOMEN - 1 VIEW COMPARISON:  01/02/2019 abdominal radiograph. FINDINGS: No dilated small bowel loops. Retained oral contrast noted throughout the colon, with interval transit of oral contrast at least to the sigmoid colon. Mild-to-moderate stool and gas in the colon. No evidence of pneumatosis or pneumoperitoneum. No radiopaque nephrolithiasis. Gastrostomy tube not well visualized on these radiographs presumably obscured by retained oral contrast in the colon. IMPRESSION: Nonobstructive bowel gas pattern. Retained oral contrast throughout the colon. Mild-to-moderate colonic stool and gas. Electronically Signed   By: Ilona Sorrel M.D.   On: 01/08/2019 14:26        Scheduled Meds: . chlorhexidine gluconate (MEDLINE KIT)  15 mL Mouth Rinse BID  . Chlorhexidine Gluconate Cloth  6 each Topical Q0600  . cloBAZam  60 mg Per Tube BID  . enoxaparin (LOVENOX) injection  40 mg Subcutaneous Q12H  . feeding supplement (PRO-STAT SUGAR FREE 64)  60 mL Per Tube BID  . levothyroxine  25 mcg Per Tube Q0600  . lisinopril  40 mg Per NG tube Daily  . mouth rinse  15 mL Mouth Rinse q12n4p  . metoprolol tartrate  125 mg Per NG tube BID  . multivitamin  15 mL Per Tube Daily  . nystatin   Topical Daily  .  pantoprazole sodium  40 mg Per Tube BID  . potassium chloride  40 mEq Per Tube Daily  . sodium chloride flush  10-40 mL Intracatheter Q12H  . sodium chloride flush  10-40 mL Intracatheter Q12H  . tamsulosin  0.4 mg Oral Daily  . thiamine injection  100 mg Intravenous Daily  . vitamin B-12  1,000 mcg Per Tube Daily   Continuous Infusions: . feeding supplement (VITAL 1.5 CAL) 1,000 mL (01/07/19 0211)  . levETIRAcetam 1,000 mg (01/09/19 0900)  . sodium chloride       LOS: 32 days     Georgette Shell, MD [facility]  If 7PM-7AM, please contact night-coverage [email or web address] Password [email or web address] 01/09/2019, 2:05 PM

## 2019-01-10 LAB — GLUCOSE, CAPILLARY: Glucose-Capillary: 121 mg/dL — ABNORMAL HIGH (ref 70–99)

## 2019-01-10 LAB — LACTIC ACID, PLASMA: Lactic Acid, Venous: 1.6 mmol/L (ref 0.5–1.9)

## 2019-01-10 MED ORDER — DIGOXIN 0.25 MG/ML IJ SOLN
0.1250 mg | Freq: Once | INTRAMUSCULAR | Status: AC
  Administered 2019-01-10: 0.125 mg via INTRAVENOUS
  Filled 2019-01-10: qty 2

## 2019-01-10 MED ORDER — LORAZEPAM 2 MG/ML IJ SOLN
0.5000 mg | INTRAMUSCULAR | Status: AC
  Administered 2019-01-10: 0.5 mg via INTRAVENOUS
  Filled 2019-01-10: qty 1

## 2019-01-10 MED ORDER — LORAZEPAM 2 MG/ML IJ SOLN: 1.0000 mg | INTRAMUSCULAR | Status: DC | PRN

## 2019-01-10 NOTE — Progress Notes (Signed)
MD spoke with Pt's mother about making Pt comfort care. RN was present for support. MD explained what comfort care and what measures could be taken if this option was chosen for the Pt. Questions were answered. Arrangements were made for the Pt's grandparents and father to come and see him. It is this RN's understanding that after family has visited Pt will be switched to comfort care.

## 2019-01-10 NOTE — Progress Notes (Signed)
PROGRESS NOTE    Aaron Vega  NWG:[id number] DOB: 1995-06-09 DOA: 12/04/2018 PCP: Aaron Vega, Aaron Vega   Brief Narrative:41 year oldautisticmalewith autism and known epilepsy followed by Aaron Vega and nonverbal at baseline, with marked cognitive decline and gait dysfunction since head injury after seizure on 08/2018 attributed to hypoxic ischemic encephalopathy following prolonged seizure. Prior to that he had a much better quality of life in terms of independence with ADLs. He presented on 11/28/2018 with severe obstipation, lethargy and vomiting with decreased urinary output. Admitted with working diagnosis of subacute shock with lactic acidosis from aspiration pneumonia, metabolic encephalopathy and ileus.  Hospital course complicated by  -persistent lactic acidosis -Wernicke's encephalopathy in setting of severe thiamine deficiency, confirmed on MRI 11/16 (given rapid declinein cognition and gait in addition to Aaron onset of peripheral neuropathic symptoms since the onset of intractable N/V) -Adynamic ileus and hepatic steatosis, 11/28 on Ct abd/pelvis -GBS --given distal weaknesstreated with IVIG.  -Intubation for respiratory failure in setting of sepsis and inability to clear secretions (11/27 intubation, extubated 11/30) -Pressor support for hypotension, off on 11/30 -Frequent need for deep suctioning due to inability to clear secretions   Assessment & Plan:   Principal Problem:   Severe sepsis (Aaron Vega) Active Problems:   Autistic disorder   Intractable nausea and vomiting   Seizure disorder (Aaron Vega)   Constipation in male   Obesity, Class III, BMI 40-49.9 (morbid obesity) (Aaron Vega)   Ileus (Aaron Vega)   Aspiration pneumonia (Aaron Vega)   Community acquired pneumonia of left lower lobe of lung   Goals of care, counseling/discussion   Palliative care by specialist   DNR (do not resuscitate) discussion   Protein-calorie malnutrition, severe   Weakness of lower  extremity   Aspiration into airway   Pressure injury of skin   Fever   Diarrhea   Wernicke's encephalopathy   GBS (Aaron Vega) (Aaron Vega)   Dysphagia   Do not intubate but use all other measures   Leukocytosis   Sinus tachycardia   Hypotension  #1 acute on chronic hypoxic respiratory failure secondary to aspiration pneumonia.    He is on 3 L of oxygen saturating 99%.  He had completed treatment for aspiration pneumonia.  However it does appear he probably aspirated again last night.  Tube feeds are being on hold at this time.  Since he has completed treatment with antibiotics for aspiration I will DC Zosyn.  Continue deep suctioning and chest PT for now.    #2 sinus tachycardia patient remains tachycardic overnight.  He is also hypotensive today compared to the previous days.  He received multiple fluid boluses without any improvement.  He was also given 2 doses of digoxin without any improvement.  #3 status post PEG by interventional radiology 12 /8.  Continue to hold  tube feeds.  #4 hypotension no improvement with fluid boluses DC ACE inhibitor yesterday.  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  He also received high-dose thiamine.  #6 chronic epilepsy on Keppra and Onfi  #7 adynamic ileus stable patient has a rectal tube in place with loose stools.  #8 severe malnutrition tube feeds on hold due to aspiration  #8 hypothyroidism continue Synthroid stable  #9 goals of care palliative care support appreciated.  Discussed with patient's mother face-to-face with nurse present in the conference room.  I discussed in detail what the patient is going through and what his Vega problems are and  no matter how aggressive we treat him his outcome is not going to change.  She asked me multiple times if the patient is dying and wanted to make sure that  she is making the right decision.  I have discussed with her the poor prognosis and that [name] will not have a meaningful recovery in spite of maximum treatments.  She wants patient's father and her father and mother to visit him today and then patient will be made comfort care.  We will continue current medications till then.  I have stopped his Lovenox. She does understand that the patient is in pain and in some distress and that he needs comfort and relief from that pain.  For now continue antibiotics and seizure medications IV.  Pressure Injury 12/20/18 Buttocks Right Deep Tissue Injury - Purple or maroon localized area of discolored intact skin or blood-filled blister due to damage of underlying soft tissue from pressure and/or shear. pink blanchable skin with small area in mid (Active)  12/20/18 1200  Location: Buttocks  Location Orientation: Right  Staging: Deep Tissue Injury - Purple or maroon localized area of discolored intact skin or blood-filled blister due to damage of underlying soft tissue from pressure and/or shear.  Wound Description (Comments): pink blanchable skin with small area in middle that is purple and nonblanchable  Present on Admission: Yes      Nutrition Problem: Severe Malnutrition Etiology: acute illness     Signs/Symptoms: energy intake < or equal to 50% for > or equal to 5 days, percent weight loss    Interventions: Tube feeding  Estimated body mass index is 43.72 kg/m as calculated from the following:   Height as of this encounter: '5\' 11"'  (1.803 m).   Weight as of this encounter: 142.2 kg.  DVT prophylaxis none Lovenox stopped 01/10/2019. code Status:dnr/dni Family Communication:dw mother in detail disposition Plan: Pending clinical progress Consultants:   PCCM, GI, palliative care, Vega  Procedures: 10/16 NG tube placed per interventional radiology 11/15 Midline > 11/20 lumbar puncture 11/27 endotracheal intubation > 11/30 11/27  central line placement, right IJ > 12/1* 10/15 EGD: esophagitis 10/19: flex sig/disimpaction, 10/29: flex sig/disimpaction 12/30/18: vomiting, aspiration in setting of acute hypoxia, unresponsiveness. Xray with increased dilated loops SB, feeding tube in distal SB KUB 12/6--improved non=specific BGP, decreased distension 12/8: IR PEG tube placed  Antimicrobials none  Subjective:  Patient in bed eyes open getting chest PT mother by the bedside overnight multiple events including hypotension and tachycardia which was treated with IV fluids and digoxin with no significant improvement.  This morning he is still tacky at 150s sinus tach with borderline blood pressure.  Tube feeds on hold overnight due to hypoxia and possible aspiration Objective: Vitals:   01/10/19 0400 01/10/19 0500 01/10/19 0600 01/10/19 0730  BP: (!) 111/56 (!) 96/59 103/84 91/79  Pulse: (!) 146 (!) 143 (!) 144 (!) 134  Resp: (!) 29 (!) 29 (!) 33 (!) 24  Temp: 98.4 F (36.9 C) 99.1 F (37.3 C) 98.3 F (36.8 C) 98.4 F (36.9 C)  TempSrc: Rectal Rectal Rectal Rectal  SpO2: 91% 96% 95% 99%  Weight:      Height:        Intake/Output Summary (Last 24 hours) at 01/10/2019 1240 Last data filed at 01/10/2019 0734 Gross per 24 hour  Intake 1669.08 ml  Output 2100 ml  Net -430.92 ml   Filed Weights   01/07/19 0500 01/08/19 0500 01/09/19 0500  Weight: (!) 142.1 kg (!) 142.2 kg Marland Kitchen)  142.2 kg    Examination:  General exam: Appears in mild distress Respiratory system: Coarse breath sounds Cardiovascular system: S1 & S2 heard, RRR. No JVD, murmurs, rubs, gallops or clicks. No pedal edema. Gastrointestinal system: Abdomen is nondistended, soft and nontender. No organomegaly or masses felt. Normal bowel sounds heard. Central nervous system: Awake eyes open  extremities: Trace bilateral pitting edema Skin: No rashes, lesions or ulcers Psychiatry: Unable to assess   Data Reviewed: I have personally reviewed following  labs and imaging studies  CBC: Recent Labs  Lab 01/06/19 0440 01/07/19 0500 01/08/19 0500 01/09/19 0500 01/09/19 2130  WBC 12.7* 14.5* 12.7* 15.6* 17.4*  NEUTROABS  --   --   --   --  14.0*  HGB 9.9* 10.6* 10.5* 11.2* 10.8*  HCT 32.3* 33.9* 32.8* 34.6* 33.6*  MCV 92.0 89.0 89.4 88.5 89.1  PLT 459* 517* 478* 573* 381*   Basic Metabolic Panel: Recent Labs  Lab 01/06/19 0440 01/07/19 0500 01/08/19 0500 01/09/19 0500 01/09/19 2130  NA 139 138 136 133* 134*  K 4.4 4.1 4.5 3.8 4.7  CL 100 99 99 95* 99  CO2 '26 28 27 26 23  ' GLUCOSE 111* 105* 101* 112* 128*  BUN '13 10 13 14 ' 23*  CREATININE <0.30* <0.30* <0.30* 0.71 0.60*  CALCIUM 9.2 9.7 9.4 9.9 9.6  MG  --   --   --  1.8  --    GFR: Estimated Creatinine Clearance: 210.9 mL/min (A) (by C-G formula based on SCr of 0.6 mg/dL (L)). Liver Function Tests: Recent Labs  Lab 01/09/19 2130  AST 62*  ALT 65*  ALKPHOS 75  BILITOT 0.4  PROT 6.7  ALBUMIN 3.0*   No results for input(s): LIPASE, AMYLASE in the last 168 hours. No results for input(s): AMMONIA in the last 168 hours. Coagulation Profile: No results for input(s): INR, PROTIME in the last 168 hours. Cardiac Enzymes: No results for input(s): CKTOTAL, CKMB, CKMBINDEX, TROPONINI in the last 168 hours. BNP (last 3 results) No results for input(s): PROBNP in the last 8760 hours. HbA1C: No results for input(s): HGBA1C in the last 72 hours. CBG: Recent Labs  Lab 01/07/19 1635 01/08/19 0735 01/08/19 1147 01/08/19 1742 01/09/19 2000  GLUCAP 100* 90 105* 117* 118*   Lipid Profile: No results for input(s): CHOL, HDL, LDLCALC, TRIG, CHOLHDL, LDLDIRECT in the last 72 hours. Thyroid Function Tests: No results for input(s): TSH, T4TOTAL, FREET4, T3FREE, THYROIDAB in the last 72 hours. Anemia Panel: No results for input(s): VITAMINB12, FOLATE, FERRITIN, TIBC, IRON, RETICCTPCT in the last 72 hours. Sepsis Labs: Recent Labs  Lab 01/09/19 2114 01/10/19 0434    LATICACIDVEN 2.0* 1.6    Recent Results (from the past 240 hour(s))  Culture, blood (routine x 2)     Status: None   Collection Time: 01/02/19  9:03 AM   Specimen: BLOOD LEFT HAND  Result Value Ref Range Status   Specimen Description BLOOD LEFT HAND  Final   Special Requests   Final    BOTTLES DRAWN AEROBIC ONLY Blood Culture results may not be optimal due to an inadequate volume of blood received in culture bottles   Culture   Final    NO GROWTH 5 DAYS Performed at [facility], [place] [address] [place], [place] [address]    Report Status 01/07/2019 FINAL  Final  Culture, blood (routine x 2)     Status: None   Collection Time: 01/02/19  1:29 PM   Specimen: BLOOD  Result Value Ref Range Status   Specimen Description BLOOD LEFT FOOT  Final   Special Requests   Final    BOTTLES DRAWN AEROBIC ONLY Blood Culture adequate volume   Culture   Final    NO GROWTH 5 DAYS Performed at [facility], [address] [place], [place] [address]    Report Status 01/07/2019 FINAL  Final         Radiology Studies: CT ABDOMEN PELVIS W CONTRAST  Result Date: 01/08/2019 CLINICAL DATA:  Intestinal dysmotility. Persistent leukocytosis. Decreased stool output. EXAM: CT ABDOMEN AND PELVIS WITH CONTRAST TECHNIQUE: Multidetector CT imaging of the abdomen and pelvis was performed using the standard protocol following bolus administration of intravenous contrast. CONTRAST:  15m OMNIPAQUE IOHEXOL 300 MG/ML  SOLN COMPARISON:  December 22, 2018 FINDINGS: Lower chest: The lung bases are clear. The heart size is normal. Hepatobiliary: There is decreased hepatic attenuation suggestive of hepatic steatosis. Normal gallbladder.There is no biliary ductal dilation. Pancreas: Normal contours without ductal dilatation. No peripancreatic fluid collection. Spleen: No splenic laceration or hematoma. Adrenals/Urinary Tract: --Adrenal glands: No adrenal hemorrhage. --Right kidney/ureter: No hydronephrosis or  perinephric hematoma. --Left kidney/ureter: No hydronephrosis or perinephric hematoma. --Urinary bladder: The bladder is decompressed with a Foley catheter Stomach/Bowel: --Stomach/Duodenum: There is a Aaron percutaneous gastrostomy tube in place. The tube appears well position. There is some mild fat stranding about the gastrostomy tube tract site without evidence for a well-formed drainable fluid collection. --Small bowel: There are mildly dilated loops of fluid-filled small bowel throughout the abdomen without evidence for high-grade obstruction. --Colon: There is a rectal tube in place. Oral contrast is noted throughout the colon. The colon is relatively decompressed when compared to the prior CT. --Appendix: Normal. 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 The aorta is unremarkable. --No retroperitoneal lymphadenopathy. --No mesenteric lymphadenopathy. --No pelvic or inguinal lymphadenopathy. Reproductive: Unremarkable Other: No ascites or free air. The abdominal wall is normal. Musculoskeletal. No acute displaced fractures. IMPRESSION: 1. There is a Aaron percutaneous gastrostomy tube in place. The tube appears well position. There is some mild fat stranding about the gastrostomy tube tract without evidence for a well-formed drainable fluid collection. Findings may be secondary to the recent intervention versus a developing soft tissue infection. 2. There are mildly dilated loops of fluid-filled small bowel throughout the abdomen without evidence for high-grade obstruction. Oral contrast is noted throughout the colon. The colon is relatively decompressed when compared to the prior CT. 3. Hepatic steatosis. 4. Incidentally noted duplicated IVC, a normal variant. Electronically Signed   By: CConstance HolsterM.D.   On: 01/08/2019 22:34   DG CHEST PORT 1 VIEW  Result Date: 01/09/2019 CLINICAL DATA:  Shortness of breath EXAM: PORTABLE CHEST 1 VIEW COMPARISON:  01/05/2019  FINDINGS: Right-sided PICC line remains in place with distal tip terminating the level of the right atrium. Stable cardiomediastinal contours. Lung volumes are low. Mild streaky left basilar opacity, Aaron from prior. Right lung is clear. No pleural effusion or pneumothorax. IMPRESSION: Mild streaky left basilar opacity, Aaron from prior, could reflect atelectasis or infiltrate. Electronically Signed   By: NDavina PokeM.D.   On: 01/09/2019 20:40   DG Abd Portable 1V  Result Date: 01/08/2019 CLINICAL DATA:  Gastrostomy tube. No bowel movement for several days. History of ileus. Tachycardia. EXAM: PORTABLE ABDOMEN - 1 VIEW COMPARISON:  01/02/2019 abdominal radiograph. FINDINGS: No dilated small bowel loops. Retained oral contrast noted throughout the colon, with interval transit of  oral contrast at least to the sigmoid colon. Mild-to-moderate stool and gas in the colon. No evidence of pneumatosis or pneumoperitoneum. No radiopaque nephrolithiasis. Gastrostomy tube not well visualized on these radiographs presumably obscured by retained oral contrast in the colon. IMPRESSION: Nonobstructive bowel gas pattern. Retained oral contrast throughout the colon. Mild-to-moderate colonic stool and gas. Electronically Signed   By: Ilona Sorrel M.D.   On: 01/08/2019 14:26        Scheduled Meds: . chlorhexidine gluconate (MEDLINE KIT)  15 mL Mouth Rinse BID  . Chlorhexidine Gluconate Cloth  6 each Topical Q0600  . cloBAZam  60 mg Per Tube BID  . feeding supplement (PRO-STAT SUGAR FREE 64)  60 mL Per Tube BID  . levothyroxine  25 mcg Per Tube Q0600  . mouth rinse  15 mL Mouth Rinse q12n4p  . metoprolol tartrate  125 mg Per NG tube BID  . multivitamin  15 mL Per Tube Daily  . nystatin   Topical Daily  . pantoprazole sodium  40 mg Per Tube BID  . potassium chloride  40 mEq Per Tube Daily  . sodium chloride flush  10-40 mL Intracatheter Q12H  . sodium chloride flush  10-40 mL Intracatheter Q12H  . thiamine  injection  100 mg Intravenous Daily   Continuous Infusions: . dextrose 5 % and 0.9% NaCl 125 mL/hr at 01/10/19 1031  . levETIRAcetam 1,000 mg (01/10/19 1006)  . piperacillin-tazobactam (ZOSYN)  IV 3.375 g (01/10/19 0530)     LOS: 62 days    Georgette Shell, MD [facility]  If 7PM-7AM, please contact night-coverage [email or web address] Password [email or web address] 01/10/2019, 12:40 PM

## 2019-01-10 NOTE — Progress Notes (Signed)
PT Cancellation Note  Patient Details Name: Aaron Vega MRN: [id number] DOB: 11-Jul-1995   Cancelled Treatment:    Reason Eval/Treat Not Completed: Other (comment) - Pt's mother at bedside states pt's grandparents are coming to visit, requests PT check back tomorrow.  [place] Pager [phone]  Office [phone]    Lilymarie Scroggins D Elonda Husky 01/10/2019, 11:04 AM

## 2019-01-10 NOTE — Progress Notes (Signed)
OT Cancellation Note  Patient Details Name: Aaron Vega MRN: [id number] DOB: 12-25-1995   Cancelled Treatment:    Reason Eval/Treat Not Completed: Patient declined, no reason specified(Family declining OT. Pt's mother stating "maybe tomorrow.")   Ebony Hail Harold Hedge) Marsa Aris OTR/L Acute Rehabilitation Services Pager: [phone] Office: [place] 01/10/2019, 11:06 AM

## 2019-01-11 DIAGNOSIS — J9621 Acute and chronic respiratory failure with hypoxia: Secondary | ICD-10-CM

## 2019-01-11 DIAGNOSIS — R52 Pain, unspecified: Secondary | ICD-10-CM

## 2019-01-11 MED ORDER — MORPHINE SULFATE (PF) 2 MG/ML IV SOLN
2.0000 mg | INTRAVENOUS | Status: DC | PRN
  Administered 2019-01-11 – 2019-01-13 (×6): 2 mg via INTRAVENOUS
  Filled 2019-01-11 (×7): qty 1

## 2019-01-11 NOTE — Progress Notes (Signed)
PROGRESS NOTE    Aaron Vega  FMB:[id number] DOB: Oct 24, 1995 DOA: 12/22/2018 PCP: [facility]  Brief Narrative:33 year oldautisticmalewith autism and known epilepsy followed by [facility] Neurology and nonverbal at baseline, with marked cognitive decline and gait dysfunction since head injury after seizure on 08/2018 attributed to hypoxic ischemic encephalopathy following prolonged seizure. Prior to that he had a much better quality of life in terms of independence with ADLs. He presented on 12/16/2018 with severe obstipation, lethargy and vomiting with decreased urinary output. Admitted with working diagnosis of subacute shock with lactic acidosis from aspiration pneumonia, metabolic encephalopathy and ileus.  Hospital course complicated by  -persistent lactic acidosis -Wernicke's encephalopathy in setting of severe thiamine deficiency, confirmed on MRI 11/16 (given rapid declinein cognition and gait in addition to new onset of peripheral neuropathic symptoms since the onset of intractable N/V) -Adynamic ileus and hepatic steatosis, 11/28 on Ct abd/pelvis -GBS --given distal weaknesstreated with IVIG.  -Intubation for respiratory failure in setting of sepsis and inability to clear secretions (11/27 intubation, extubated 11/30) -Pressor support for hypotension, off on 11/30 -Frequent need for deep suctioning due to inability to clear secretions Assessment & Plan:   Principal Problem:   Severe sepsis ([place]) Active Problems:   Autistic disorder   Intractable nausea and vomiting   Seizure disorder (HCC)   Constipation in male   Obesity, Class III, BMI 40-49.9 (morbid obesity) ([place])   Ileus (HCC)   Aspiration pneumonia ([place])   Community acquired pneumonia of left lower lobe of lung   Goals of care, counseling/discussion   Palliative care by specialist   DNR (do not resuscitate) discussion   Protein-calorie malnutrition, severe   Weakness of lower extremity  Aspiration into airway   Pressure injury of skin   Fever   Diarrhea   Wernicke's encephalopathy   GBS (Guillain Barre syndrome) ([place])   Dysphagia   Do not intubate but use all other measures   Leukocytosis   Sinus tachycardia   Hypotension   Pain   Acute on chronic respiratory failure with hypoxia (HCC)  #1 acute on chronic hypoxic respiratory failure secondary to aspiration pneumonia.    #2 sinus tachycardia should get better with increasing morphine.  #3 status post PEG by interventional radiology 12 /8.  Continue to hold  tube feeds.  #4 hypotension on slow IV fluids  #5 subacute neuropathy secondary to Guillain-Barr syndrome versus vitamin B1 deficiency followed by neurology received IVIG 5-day course with no significant improvement. He also received high-dose thiamine.  #6 chronic epilepsy on Keppra and Onfi  #7 adynamic ileus stable patient has a rectal tube in place with loose stools.  #8 severe malnutrition tube feeds on hold due to aspiration  #8 hypothyroidism continue Synthroid  #9 goals of care palliative care support appreciated.   Had a long conversation with patient's mother [name] today.  [name]'s dad and his grandparents came to visit him yesterday.  Mother is aware that Aaron Vega  will not make it home at this time.  She still asked me multiple times if he was dying and no matter what we do he is still dying.  I did let her know that he is dying and he is not made any progress or making any progress.   She does not want [place] to stop Synthroid and antiepileptic drugs Keppra and Onfi.  I have increased his morphine to 2 mg every 2.  She would like him to stay in the hospital as we are expecting a hospital death.  She will not be able to tolerate a transfer of her baby to any other place other than this hospital.  She does not want him to suffer or have any pain.  She wants to take it 1 day at a time she is still trying to understand the whole process and is  trying to get ready for his departure.  Pressure Injury 12/20/18 Buttocks Right Deep Tissue Injury - Purple or maroon localized area of discolored intact skin or blood-filled blister due to damage of underlying soft tissue from pressure and/or shear. pink blanchable skin with small area in mid (Active)  12/20/18 1200  Location: Buttocks  Location Orientation: Right  Staging: Deep Tissue Injury - Purple or maroon localized area of discolored intact skin or blood-filled blister due to damage of underlying soft tissue from pressure and/or shear.  Wound Description (Comments): pink blanchable skin with small area in middle that is purple and nonblanchable  Present on Admission: Yes      Nutrition Problem: Severe Malnutrition Etiology: acute illness     Signs/Symptoms: energy intake < or equal to 50% for > or equal to 5 days, percent weight loss    Interventions: Tube feeding  Estimated body mass index is 43.72 kg/m as calculated from the following:   Height as of this encounter: _0  (1.803 m).   Weight as of this encounter: 142.2 kg.  DVT prophylaxisnone Lovenox stopped 01/10/2019. code Status:dnr/dni Family Communication:dw mother in detail disposition Plan: Pending clinical progress Consultants:  PCCM, GI, palliative care, neurology  Procedures:10/16 NG tube placed per interventional radiology 11/15 Midline > 11/20 lumbar puncture 11/27 endotracheal intubation > 11/30 11/27 central line placement, right IJ > 12/1* 10/15 EGD: esophagitis 10/19: flex sig/disimpaction, 10/29: flex sig/disimpaction 12/30/18: vomiting, aspiration in setting of acute hypoxia, unresponsiveness. Xray with increased dilated loops SB, feeding tube in distal SB KUB 12/6--improved non=specific BGP, decreased distension 12/8: IR PEG tube placed  Antimicrobialsnone   Subjective:  Patient appears more relaxed and comfortable than yesterday. Objective: Vitals:   01/11/19 0330 01/11/19  0400 01/11/19 0730 01/11/19 1200  BP: (!) 107/46 113/88 (!) 125/99 (!) 107/54  Pulse: (!) 123 (!) 127 (!) 131 (!) 119  Resp: (!) 31 18 (!) 37 (!) 23  Temp: 98.6 F (37 C)  98.8 F (37.1 C) 98.8 F (37.1 C)  TempSrc: Rectal  Rectal Rectal  SpO2: 100% 100% 100% 100%  Weight:      Height:        Intake/Output Summary (Last 24 hours) at 01/11/2019 1518 Last data filed at 01/11/2019 1100 Gross per 24 hour  Intake 3390.11 ml  Output 2100 ml  Net 1290.11 ml   Filed Weights   01/07/19 0500 01/08/19 0500 01/09/19 0500  Weight: (!) 142.1 kg (!) 142.2 kg (!) 142.2 kg    Examination:  General exam: Appears calm and comfortable  Respiratory system: Clear to auscultation. Respiratory effort normal. Cardiovascular system: S1 & S2 heard, RRR. No JVD, murmurs, rubs, gallops or clicks. No pedal edema. Gastrointestinal system: Abdomen is nondistended, soft and nontender. No organomegaly or masses felt. Normal bowel sounds heard.  PEG tube in place Central nervous system: Alert and oriented. No focal neurological deficits. Extremities: Symmetric 5 x 5 power. Skin: No rashes, lesions or ulcers Psychiatry: Judgement and insight appear normal. Mood & affect appropriate.     Data Reviewed: I have personally reviewed following labs and imaging studies  CBC: Recent Labs  Lab 01/06/19 0440 01/07/19 0500 01/08/19 0500 01/09/19 0500  01/09/19 2130  WBC 12.7* 14.5* 12.7* 15.6* 17.4*  NEUTROABS  --   --   --   --  14.0*  HGB 9.9* 10.6* 10.5* 11.2* 10.8*  HCT 32.3* 33.9* 32.8* 34.6* 33.6*  MCV 92.0 89.0 89.4 88.5 89.1  PLT 459* 517* 478* 573* 128*   Basic Metabolic Panel: Recent Labs  Lab 01/06/19 0440 01/07/19 0500 01/08/19 0500 01/09/19 0500 01/09/19 2130  NA 139 138 136 133* 134*  K 4.4 4.1 4.5 3.8 4.7  CL 100 99 99 95* 99  CO2 _0 GLUCOSE 111* 105* 101* 112* 128*  BUN _1 23*  CREATININE <0.30* <0.30* <0.30* 0.71 0.60*  CALCIUM 9.2 9.7 9.4 9.9 9.6  MG   --   --   --  1.8  --    GFR: Estimated Creatinine Clearance: 210.9 mL/min (A) (by C-G formula based on SCr of 0.6 mg/dL (L)). Liver Function Tests: Recent Labs  Lab 01/09/19 2130  AST 62*  ALT 65*  ALKPHOS 75  BILITOT 0.4  PROT 6.7  ALBUMIN 3.0*   No results for input(s): LIPASE, AMYLASE in the last 168 hours. No results for input(s): AMMONIA in the last 168 hours. Coagulation Profile: No results for input(s): INR, PROTIME in the last 168 hours. Cardiac Enzymes: No results for input(s): CKTOTAL, CKMB, CKMBINDEX, TROPONINI in the last 168 hours. BNP (last 3 results) No results for input(s): PROBNP in the last 8760 hours. HbA1C: No results for input(s): HGBA1C in the last 72 hours. CBG: Recent Labs  Lab 01/08/19 0735 01/08/19 1147 01/08/19 1742 01/09/19 2000 01/10/19 2109  GLUCAP 90 105* 117* 118* 121*   Lipid Profile: No results for input(s): CHOL, HDL, LDLCALC, TRIG, CHOLHDL, LDLDIRECT in the last 72 hours. Thyroid Function Tests: No results for input(s): TSH, T4TOTAL, FREET4, T3FREE, THYROIDAB in the last 72 hours. Anemia Panel: No results for input(s): VITAMINB12, FOLATE, FERRITIN, TIBC, IRON, RETICCTPCT in the last 72 hours. Sepsis Labs: Recent Labs  Lab 01/09/19 2114 01/10/19 0434  LATICACIDVEN 2.0* 1.6    Recent Results (from the past 240 hour(s))  Culture, blood (routine x 2)     Status: None   Collection Time: 01/02/19  9:03 AM   Specimen: BLOOD LEFT HAND  Result Value Ref Range Status   Specimen Description BLOOD LEFT HAND  Final   Special Requests   Final    BOTTLES DRAWN AEROBIC ONLY Blood Culture results may not be optimal due to an inadequate volume of blood received in culture bottles   Culture   Final    NO GROWTH 5 DAYS Performed at [facility], [place] [address], [place], [place] [address]    Report Status 01/07/2019 FINAL  Final  Culture, blood (routine x 2)     Status: None   Collection Time: 01/02/19  1:29 PM   Specimen: BLOOD    Result Value Ref Range Status   Specimen Description BLOOD LEFT FOOT  Final   Special Requests   Final    BOTTLES DRAWN AEROBIC ONLY Blood Culture adequate volume   Culture   Final    NO GROWTH 5 DAYS Performed at [name], [place] [address], [place], [place] [address]    Report Status 01/07/2019 FINAL  Final         Radiology Studies: DG CHEST PORT 1 VIEW  Result Date: 01/09/2019 CLINICAL DATA:  Shortness of breath EXAM: PORTABLE CHEST 1 VIEW COMPARISON:  01/05/2019 FINDINGS:  Right-sided PICC line remains in place with distal tip terminating the level of the right atrium. Stable cardiomediastinal contours. Lung volumes are low. Mild streaky left basilar opacity, new from prior. Right lung is clear. No pleural effusion or pneumothorax. IMPRESSION: Mild streaky left basilar opacity, new from prior, could reflect atelectasis or infiltrate. Electronically Signed   By: Davina Poke M.D.   On: 01/09/2019 20:40        Scheduled Meds: . chlorhexidine gluconate (MEDLINE KIT)  15 mL Mouth Rinse BID  . Chlorhexidine Gluconate Cloth  6 each Topical Q0600  . cloBAZam  60 mg Per Tube BID  . feeding supplement (PRO-STAT SUGAR FREE 64)  60 mL Per Tube BID  . levothyroxine  25 mcg Per Tube Q0600  . mouth rinse  15 mL Mouth Rinse q12n4p  . metoprolol tartrate  125 mg Per NG tube BID  . multivitamin  15 mL Per Tube Daily  . nystatin   Topical Daily  . pantoprazole sodium  40 mg Per Tube BID  . potassium chloride  40 mEq Per Tube Daily  . sodium chloride flush  10-40 mL Intracatheter Q12H  . sodium chloride flush  10-40 mL Intracatheter Q12H  . thiamine injection  100 mg Intravenous Daily   Continuous Infusions: . dextrose 5 % and 0.9% NaCl 50 mL/hr at 01/11/19 1245  . levETIRAcetam 1,000 mg (01/11/19 1150)     LOS: 34 days     Georgette Shell, MD [facility]  If 7PM-7AM, please contact night-coverage [email or web address] Password [facility] 01/11/2019, 3:18 PM

## 2019-01-11 NOTE — Progress Notes (Signed)
Nutrition Brief Note  Chart reviewed. Per RN, pt is currently comfort care.  No further nutrition interventions warranted at this time.  Please re-consult as needed.   Corrin Parker, MS, RD, LDN Pager # [phone] After hours/ weekend pager # [phone]

## 2019-01-12 NOTE — Progress Notes (Signed)
PROGRESS NOTE    Aaron Vega  HCW:[id number] DOB: 05-Sep-1995 DOA: 11/25/2018 PCP: [facility]  Brief Narrative:69 year oldautisticmalewith autism and known epilepsy followed by [facility] Neurology and nonverbal at baseline, with marked cognitive decline and gait dysfunction since head injury after seizure on 08/2018 attributed to hypoxic ischemic encephalopathy following prolonged seizure. Prior to that he had a much better quality of life in terms of independence with ADLs. He presented on 12/21/2018 with severe obstipation, lethargy and vomiting with decreased urinary output. Admitted with working diagnosis of subacute shock with lactic acidosis from aspiration pneumonia, metabolic encephalopathy and ileus.  Hospital course complicated by  -persistent lactic acidosis -Wernicke's encephalopathy in setting of severe thiamine deficiency, confirmed on MRI 11/16 (given rapid declinein cognition and gait in addition to new onset of peripheral neuropathic symptoms since the onset of intractable N/V) -Adynamic ileus and hepatic steatosis, 11/28 on Ct abd/pelvis -GBS --given distal weaknesstreated with IVIG.  -Intubation for respiratory failure in setting of sepsis and inability to clear secretions (11/27 intubation, extubated 11/30) -Pressor support for hypotension, off on 11/30 -Frequent need for deep suctioning due to inability to clear secretions  Assessment & Plan:   Principal Problem:   Severe sepsis ([place]) Active Problems:   Autistic disorder   Intractable nausea and vomiting   Seizure disorder (HCC)   Constipation in male   Obesity, Class III, BMI 40-49.9 (morbid obesity) ([place])   Ileus (HCC)   Aspiration pneumonia ([place])   Community acquired pneumonia of left lower lobe of lung   Goals of care, counseling/discussion   Palliative care by specialist   DNR (do not resuscitate) discussion   Protein-calorie malnutrition, severe   Weakness of lower extremity   Aspiration into airway   Pressure injury of skin   Fever   Diarrhea   Wernicke's encephalopathy   GBS (Guillain Barre syndrome) ([place])   Dysphagia   Do not intubate but use all other measures   Leukocytosis   Sinus tachycardia   Hypotension   Pain   Acute on chronic respiratory failure with hypoxia (HCC)  #1 acute on chronic hypoxic respiratory failure secondary to recurrent aspiration pneumonia.  #2 sinus tachycardia should get better with increasing morphine.  #3 status post PEG by interventional radiology 12/8.  Tube feeds stopped  #4 hypotension   #5 subacute neuropathy secondary to Guillain-Barr syndrome versus vitamin B1 deficiency status post treatment with IVIG no response patient was followed by neurology.   #6 chronic epilepsy on Keppra and Onfi  #7 adynamic ileus stablepatient has a rectal tube in place with loose stools.  #8 severe malnutritiontube feeds on hold due to aspiration  #8 hypothyroidism continue Synthroid  #9 goals of care palliative care support appreciated.  Had a long conversation with patient's mother [name] today.  Trig's dad and his grandparents came to visit him yesterday.  Mother is aware that Deacon  will not make it home at this time.  She still asked me multiple times if he was dying and no matter what we do he is still dying.  I did let her know that he is dying and he is not made any progress or making any progress.  She does not want [place] to stop Synthroid and antiepileptic drugs Keppra and Onfi.  I have increased his morphine to 2 mg every 2.  She would like him to stay in the hospital as we are expecting a hospital death.  She will not be able to tolerate a transfer of her baby to  any other place other than this hospital.  She does not want him to suffer or have any pain.  She wants to take it 1 day at a time she is still trying to understand the whole process and is trying to get ready for his departure.   Pressure  Injury 12/20/18 Buttocks Right Deep Tissue Injury - Purple or maroon localized area of discolored intact skin or blood-filled blister due to damage of underlying soft tissue from pressure and/or shear. pink blanchable skin with small area in mid (Active)  12/20/18 1200  Location: Buttocks  Location Orientation: Right  Staging: Deep Tissue Injury - Purple or maroon localized area of discolored intact skin or blood-filled blister due to damage of underlying soft tissue from pressure and/or shear.  Wound Description (Comments): pink blanchable skin with small area in middle that is purple and nonblanchable  Present on Admission: Yes      Nutrition Problem: Severe Malnutrition Etiology: acute illness     Signs/Symptoms: energy intake < or equal to 50% for > or equal to 5 days, percent weight loss    Interventions: Tube feeding  Estimated body mass index is 43.72 kg/m as calculated from the following:   Height as of this encounter: _0  (1.803 m).   Weight as of this encounter: 142.2 kg.  Subjective:  Patient appears comfortable mother sleeping by the bedside Objective: Vitals:   01/11/19 1400 01/11/19 1659 01/11/19 2152 01/12/19 1128  BP: 121/84 116/62 (!) 116/33 (!) 110/49  Pulse: (!) 120 (!) 122 (!) 132 (!) 122  Resp: (!) 26 (!) 21 (!) 25 (!) 27  Temp:  98.7 F (37.1 C) 98.8 F (37.1 C) (!) 100.7 F (38.2 C)  TempSrc:  Axillary Axillary Axillary  SpO2: 100% 100% 95% 93%  Weight:      Height:        Intake/Output Summary (Last 24 hours) at 01/12/2019 1418 Last data filed at 01/12/2019 1133 Gross per 24 hour  Intake --  Output 1000 ml  Net -1000 ml   Filed Weights   01/07/19 0500 01/08/19 0500 01/09/19 0500  Weight: (!) 142.1 kg (!) 142.2 kg (!) 142.2 kg    Examination:  General exam: Appears calm and comfortable  Respiratory system: Clear to auscultation. Respiratory effort normal. Cardiovascular system: S1 & S2 heard, RRR. No JVD, murmurs, rubs, gallops  or clicks. No pedal edema. Gastrointestinal system: Abdomen is nondistended, soft and nontender. No organomegaly or masses felt. Normal bowel sounds heard.  PEG tube in place Central nervous system: Unable to assess  extremities: Symmetric 5 x 5 power. Skin: No rashes, lesions or ulcers Psychiatry: Unable to assess  Data Reviewed: I have personally reviewed following labs and imaging studies  CBC: Recent Labs  Lab 01/06/19 0440 01/07/19 0500 01/08/19 0500 01/09/19 0500 01/09/19 2130  WBC 12.7* 14.5* 12.7* 15.6* 17.4*  NEUTROABS  --   --   --   --  14.0*  HGB 9.9* 10.6* 10.5* 11.2* 10.8*  HCT 32.3* 33.9* 32.8* 34.6* 33.6*  MCV 92.0 89.0 89.4 88.5 89.1  PLT 459* 517* 478* 573* 100*   Basic Metabolic Panel: Recent Labs  Lab 01/06/19 0440 01/07/19 0500 01/08/19 0500 01/09/19 0500 01/09/19 2130  NA 139 138 136 133* 134*  K 4.4 4.1 4.5 3.8 4.7  CL 100 99 99 95* 99  CO2 _1 GLUCOSE 111* 105* 101* 112* 128*  BUN _2 23*  CREATININE <0.30* <0.30* <0.30* 0.71  0.60*  CALCIUM 9.2 9.7 9.4 9.9 9.6  MG  --   --   --  1.8  --    GFR: Estimated Creatinine Clearance: 210.9 mL/min (A) (by C-G formula based on SCr of 0.6 mg/dL (L)). Liver Function Tests: Recent Labs  Lab 01/09/19 2130  AST 62*  ALT 65*  ALKPHOS 75  BILITOT 0.4  PROT 6.7  ALBUMIN 3.0*   No results for input(s): LIPASE, AMYLASE in the last 168 hours. No results for input(s): AMMONIA in the last 168 hours. Coagulation Profile: No results for input(s): INR, PROTIME in the last 168 hours. Cardiac Enzymes: No results for input(s): CKTOTAL, CKMB, CKMBINDEX, TROPONINI in the last 168 hours. BNP (last 3 results) No results for input(s): PROBNP in the last 8760 hours. HbA1C: No results for input(s): HGBA1C in the last 72 hours. CBG: Recent Labs  Lab 01/08/19 0735 01/08/19 1147 01/08/19 1742 01/09/19 2000 01/10/19 2109  GLUCAP 90 105* 117* 118* 121*   Lipid Profile: No results for  input(s): CHOL, HDL, LDLCALC, TRIG, CHOLHDL, LDLDIRECT in the last 72 hours. Thyroid Function Tests: No results for input(s): TSH, T4TOTAL, FREET4, T3FREE, THYROIDAB in the last 72 hours. Anemia Panel: No results for input(s): VITAMINB12, FOLATE, FERRITIN, TIBC, IRON, RETICCTPCT in the last 72 hours. Sepsis Labs: Recent Labs  Lab 01/09/19 2114 01/10/19 0434  LATICACIDVEN 2.0* 1.6    No results found for this or any previous visit (from the past 240 hour(s)).       Radiology Studies: No results found.      Scheduled Meds: . chlorhexidine gluconate (MEDLINE KIT)  15 mL Mouth Rinse BID  . Chlorhexidine Gluconate Cloth  6 each Topical Q0600  . cloBAZam  60 mg Per Tube BID  . levothyroxine  25 mcg Per Tube Q0600  . mouth rinse  15 mL Mouth Rinse q12n4p  . nystatin   Topical Daily  . sodium chloride flush  10-40 mL Intracatheter Q12H   Continuous Infusions: . dextrose 5 % and 0.9% NaCl 50 mL/hr at 01/11/19 1528  . levETIRAcetam 1,000 mg (01/12/19 0935)     LOS: 35 days     Georgette Shell, MD [facility] If 7PM-7AM, please contact night-coverage [email or web address] Password [email or web address] 01/12/2019, 2:18 PM

## 2019-01-12 NOTE — Progress Notes (Signed)
RT NOTES: 0800 CPT held. Patient comfort care and patient resting comfortably. BBS clear to ausculation.

## 2019-01-12 NOTE — Progress Notes (Signed)
CPT held at this time due to patient being comfort care and resting comfortably.

## 2019-01-12 NOTE — Progress Notes (Signed)
Called into patient's room to NTS. Obtained a small amount of tan/pink tinged; thick secretions. Placed patient on CPT to help loosen and bring up secretions. RT will continue to monitor patient.

## 2019-01-13 MED ORDER — MORPHINE SULFATE (PF) 2 MG/ML IV SOLN
2.0000 mg | INTRAVENOUS | Status: DC | PRN
  Administered 2019-01-14 – 2019-01-15 (×4): 2 mg via INTRAVENOUS
  Filled 2019-01-13 (×4): qty 1

## 2019-01-13 MED ORDER — MORPHINE SULFATE (PF) 2 MG/ML IV SOLN: 1.0000 mg | INTRAVENOUS | Status: DC | PRN

## 2019-01-13 MED ORDER — GLYCOPYRROLATE 0.2 MG/ML IJ SOLN
0.1000 mg | Freq: Once | INTRAMUSCULAR | Status: AC
  Administered 2019-01-13: 0.1 mg via INTRAVENOUS
  Filled 2019-01-13: qty 1

## 2019-01-13 MED ORDER — PIPERACILLIN-TAZOBACTAM 3.375 G IVPB
3.3750 g | Freq: Three times a day (TID) | INTRAVENOUS | Status: DC
  Administered 2019-01-13 – 2019-01-15 (×6): 3.375 g via INTRAVENOUS
  Filled 2019-01-13 (×7): qty 50

## 2019-01-13 MED ORDER — MORPHINE SULFATE (PF) 2 MG/ML IV SOLN
2.0000 mg | INTRAVENOUS | Status: DC
  Administered 2019-01-13 (×3): 2 mg via INTRAVENOUS
  Filled 2019-01-13 (×3): qty 1

## 2019-01-13 MED ORDER — LORAZEPAM 2 MG/ML IJ SOLN
1.0000 mg | INTRAMUSCULAR | Status: DC
  Administered 2019-01-13 – 2019-01-15 (×9): 1 mg via INTRAVENOUS
  Filled 2019-01-13 (×9): qty 1

## 2019-01-13 NOTE — Progress Notes (Signed)
Pharmacy Antibiotic Note  Aaron Vega is a 23 y.o. male admitted on 12/31/18 with aspiration pneumonia.  Pharmacy has been consulted for Zosyn dosing. Scr 0.6 mg/dl  Plan: Zosyn 3.375g IV q8h (4 hour infusion).  Monitor renal function, clinical status and C&S.   Height: 5\' 11"  (180.3 cm) Weight: (!) 313 lb 7.9 oz (142.2 kg) IBW/kg (Calculated) : 75.3  Temp (24hrs), Avg:100.9 F (38.3 C), Min:100.7 F (38.2 C), Max:101.3 F (38.5 C)  Recent Labs  Lab 01/07/19 0500 01/08/19 0500 01/09/19 0500 01/09/19 2114 01/09/19 2130 01/10/19 0434  WBC 14.5* 12.7* 15.6*  --  17.4*  --   CREATININE <0.30* <0.30* 0.71  --  0.60*  --   LATICACIDVEN  --   --   --  2.0*  --  1.6    Estimated Creatinine Clearance: 210.9 mL/min (A) (by C-G formula based on SCr of 0.6 mg/dL (L)).    No Known Allergies  Antimicrobials this admission: Zosyn 12/20 >>   Thank you for allowing pharmacy to be a part of this patient's care.  Alanda Slim, PharmD, [facility] Clinical Pharmacist Please see AMION for all Pharmacists' Contact Phone Numbers 01/13/2019, 1:58 PM

## 2019-01-13 NOTE — Progress Notes (Signed)
PROGRESS NOTE    Aaron Vega  VFI:[id number] DOB: 03-06-1995 DOA: 12/21/2018 PCP: [facility]    Brief Narrative:  23 year old autistic male and prior history of epilepsy follows at [facility] neurology, nonverbal at baseline with marked cognitive decline and gait dysfunction with severe obstipation initially presented to the hospital in November with severe obstipation lethargy and vomiting later on he was admitted this time with aspiration pneumonia, metabolic encephalopathy and ileus patient's hospital course was complicated by persistent lactic acidosis, Wernicke's encephalopathy in the setting thing of severe thiamine deficiency confirmed on MRI on 11/16, adynamic ileus and hepatic steatosis evident on CT abdomen and pelvis on 12/22/2018, GBS treated with IVIG, Intubation for respiratory failure in the setting of sepsis, and aspiration pneumonia.   Assessment & Plan:   Principal Problem:   Severe sepsis ([place]) Active Problems:   Autistic disorder   Intractable nausea and vomiting   Seizure disorder (HCC)   Constipation in male   Obesity, Class III, BMI 40-49.9 (morbid obesity) ([place])   Ileus (HCC)   Aspiration pneumonia (HCC)   Community acquired pneumonia of left lower lobe of lung   Goals of care, counseling/discussion   Palliative care by specialist   DNR (do not resuscitate) discussion   Protein-calorie malnutrition, severe   Weakness of lower extremity   Aspiration into airway   Pressure injury of skin   Fever   Diarrhea   Wernicke's encephalopathy   GBS (Guillain Barre syndrome) (HCC)   Dysphagia   Do not intubate but use all other measures   Leukocytosis   Sinus tachycardia   Hypotension   Pain   Acute on chronic respiratory failure with hypoxia (HCC)  Acute on chronic respiratory failure secondary to recurrent aspiration pneumonia patient's mom is adamant about giving him antibiotics for a day or 2 to see if his infection will  improve.    Sinus tachycardia probably secondary to increased respiratory work   Nutrition s/p PEG by IR on 12/8 but patient is transition to comfort measures and tube feeds have been stopped.   Hypotension Blood pressure parameters appear to to have improved.  History of Guillain Barr syndrome Treated with IVIG.  History of chronic epilepsy Resume Keppra and onfi.  Adynamic ileus  patient has a rectal tube in place with loose stool.   Hypothyroidism:  Resume synthroid.   Recent Warnicke encephalopathy Treated with thiamine.    Unfortunately patient continues to be tachypneic, tachycardic with bilateral rhonchi and respiratory distress requiring up to 3 L of nasal cannula oxygen with underlying multiple comorbidities, without any clinical progression to medications or antibiotics, poor functional status, clinical deterioration. Palliative care consulted. Patient's mom transition him to comfort measures but she would like to see if antibiotics would help with his respiratory distress and fevers for a day or 2.    Pressure Injury 12/20/18 Buttocks Right Deep Tissue Injury - Purple or maroon localized area of discolored intact skin or blood-filled blister due to damage of underlying soft tissue from pressure and/or shear. pink blanchable skin with small area in mid (Active)  12/20/18 1200  Location: Buttocks  Location Orientation: Right  Staging: Deep Tissue Injury - Purple or maroon localized area of discolored intact skin or blood-filled blister due to damage of underlying soft tissue from pressure and/or shear.  Wound Description (Comments): pink blanchable skin with small area in middle that is purple and nonblanchable  Present on Admission: Yes    . Severe protein calorie malnutrition:  DVT prophylaxis: SCD'S Code Status: DNR Family Communication: mom at bedside.  Disposition Plan: Poor prognosis on comfort measures  Consultants:  Palliative  care    Procedures: None   Antimicrobials: Zosyn restarted on 01/13/2019   Subjective: Patient is autistic and does not follow commands. He is alert and does not appear to in distress.  Objective: Vitals:   01/12/19 1128 01/12/19 1924 01/13/19 0900 01/13/19 1227  BP: (!) 110/49 (!) 109/45 118/70 (!) 141/62  Pulse: (!) 122 (!) 123  (!) 130  Resp: (!) 27 (!) 21  18  Temp: (!) 100.7 F (38.2 C) (!) 101.3 F (38.5 C) (!) 100.7 F (38.2 C) (!) 100.8 F (38.2 C)  TempSrc: Axillary Axillary Oral Oral  SpO2: 93% 95%  99%  Weight:      Height:        Intake/Output Summary (Last 24 hours) at 01/13/2019 1343 Last data filed at 01/13/2019 4174 Gross per 24 hour  Intake 3144.23 ml  Output 750 ml  Net 2394.23 ml   Filed Weights   01/07/19 0500 01/08/19 0500 01/09/19 0500  Weight: (!) 142.1 kg (!) 142.2 kg (!) 142.2 kg    Examination:  General exam: Patient appears restless does not appear to be in distress Respiratory system: Bilateral rhonchi both anteriorly and posteriorly.  Tachypneic Cardiovascular system: S1-S2 heard, tachycardic Gastrointestinal system: Abdomen is soft, nontender, nondistended, bowel sounds normal Central nervous system: Patient able to move his upper extremities, alert.  Does not follow commands Extremities: No pedal edema Skin: No rashes,  Psychiatry: pt is autistic.     Data Reviewed: I have personally reviewed following labs and imaging studies  CBC: Recent Labs  Lab 01/07/19 0500 01/08/19 0500 01/09/19 0500 01/09/19 2130  WBC 14.5* 12.7* 15.6* 17.4*  NEUTROABS  --   --   --  14.0*  HGB 10.6* 10.5* 11.2* 10.8*  HCT 33.9* 32.8* 34.6* 33.6*  MCV 89.0 89.4 88.5 89.1  PLT 517* 478* 573* 081*   Basic Metabolic Panel: Recent Labs  Lab 01/07/19 0500 01/08/19 0500 01/09/19 0500 01/09/19 2130  NA 138 136 133* 134*  K 4.1 4.5 3.8 4.7  CL 99 99 95* 99  CO2 _0 GLUCOSE 105* 101* 112* 128*  BUN _1 23*  CREATININE  <0.30* <0.30* 0.71 0.60*  CALCIUM 9.7 9.4 9.9 9.6  MG  --   --  1.8  --    GFR: Estimated Creatinine Clearance: 210.9 mL/min (A) (by C-G formula based on SCr of 0.6 mg/dL (L)). Liver Function Tests: Recent Labs  Lab 01/09/19 2130  AST 62*  ALT 65*  ALKPHOS 75  BILITOT 0.4  PROT 6.7  ALBUMIN 3.0*   No results for input(s): LIPASE, AMYLASE in the last 168 hours. No results for input(s): AMMONIA in the last 168 hours. Coagulation Profile: No results for input(s): INR, PROTIME in the last 168 hours. Cardiac Enzymes: No results for input(s): CKTOTAL, CKMB, CKMBINDEX, TROPONINI in the last 168 hours. BNP (last 3 results) No results for input(s): PROBNP in the last 8760 hours. HbA1C: No results for input(s): HGBA1C in the last 72 hours. CBG: Recent Labs  Lab 01/08/19 0735 01/08/19 1147 01/08/19 1742 01/09/19 2000 01/10/19 2109  GLUCAP 90 105* 117* 118* 121*   Lipid Profile: No results for input(s): CHOL, HDL, LDLCALC, TRIG, CHOLHDL, LDLDIRECT in the last 72 hours. Thyroid Function Tests: No results for input(s): TSH, T4TOTAL, FREET4, T3FREE, THYROIDAB in the last 72 hours. Anemia Panel:  No results for input(s): VITAMINB12, FOLATE, FERRITIN, TIBC, IRON, RETICCTPCT in the last 72 hours. Sepsis Labs: Recent Labs  Lab 01/09/19 2114 01/10/19 0434  LATICACIDVEN 2.0* 1.6    No results found for this or any previous visit (from the past 240 hour(s)).       Radiology Studies: No results found.      Scheduled Meds: . chlorhexidine gluconate (MEDLINE KIT)  15 mL Mouth Rinse BID  . Chlorhexidine Gluconate Cloth  6 each Topical Q0600  . cloBAZam  60 mg Per Tube BID  . levothyroxine  25 mcg Per Tube Q0600  . LORazepam  1 mg Intravenous Q4H  . mouth rinse  15 mL Mouth Rinse q12n4p  .  morphine injection  2 mg Intravenous Q2H  . nystatin   Topical Daily  . sodium chloride flush  10-40 mL Intracatheter Q12H   Continuous Infusions: . dextrose 5 % and 0.9% NaCl 50  mL/hr at 01/13/19 0500  . levETIRAcetam 1,000 mg (01/13/19 1200)     LOS: 36 days        Hosie Poisson, MD [facility] 01/13/2019, 1:43 PM

## 2019-01-14 NOTE — Progress Notes (Signed)
PROGRESS NOTE    Aaron Vega  FTD:[id number] DOB: 09/17/1995 DOA: 12/21/2018 PCP: [facility]    Brief Narrative:  23 year old autistic male and prior history of epilepsy follows at [facility] neurology, nonverbal at baseline with marked cognitive decline and gait dysfunction with severe obstipation initially presented to the hospital in November with severe obstipation lethargy and vomiting later on he was admitted this time with aspiration pneumonia, metabolic encephalopathy and ileus patient's hospital course was complicated by persistent lactic acidosis, Wernicke's encephalopathy in the setting thing of severe thiamine deficiency confirmed on MRI on 11/16, adynamic ileus and hepatic steatosis evident on CT abdomen and pelvis on 12/22/2018, GBS treated with IVIG, Intubation for respiratory failure in the setting of sepsis, and aspiration pneumonia.  No events overnight, patient's mom does not want to move the patient out of 4 N. at this time.  Assessment & Plan:   Principal Problem:   Severe sepsis ([place]) Active Problems:   Autistic disorder   Intractable nausea and vomiting   Seizure disorder (HCC)   Constipation in male   Obesity, Class III, BMI 40-49.9 (morbid obesity) ([place])   Ileus (HCC)   Aspiration pneumonia ([place])   Community acquired pneumonia of left lower lobe of lung   Goals of care, counseling/discussion   Palliative care by specialist   DNR (do not resuscitate) discussion   Protein-calorie malnutrition, severe   Weakness of lower extremity   Aspiration into airway   Pressure injury of skin   Fever   Diarrhea   Wernicke's encephalopathy   GBS (Guillain Barre syndrome) ([place])   Dysphagia   Do not intubate but use all other measures   Leukocytosis   Sinus tachycardia   Hypotension   Pain   Acute on chronic respiratory failure with hypoxia (HCC)  Acute on chronic respiratory failure  secondary to recurrent aspiration pneumonia, patient has  completed a course of antibiotics earlier in the admission. patient's mom is adamant about giving him antibiotics for a day or 2 to see if his infection will improve.   Sinus tachycardia probably secondary to increased work of breathing. Morphine 2 mg every 2 hours as needed added.   Nutrition  s/p PEG by IR on 12/8   transition to comfort measures and tube feeds have been stopped.   Hypotension Resolved Blood pressure parameters have improved  History of Guillain Barr syndrome Treated with IVIG.  History of chronic epilepsy Resume Keppra and onfi.  Adynamic ileus  patient has a rectal tube in place with loose stool.   Hypothyroidism:  Patient's mother wants to resume the Synthroid.  Recent Warnicke encephalopathy Treated with thiamine.    Unfortunately patient continues to be tachypneic, tachycardic with bilateral rhonchi and respiratory distress requiring up to 3 L of nasal cannula oxygen with underlying multiple comorbidities, without any clinical progression to medications or antibiotics, poor functional status, clinical deterioration. Palliative care consulted. Patient's mom transition him to comfort measures but she would like to see if antibiotics would help with his respiratory distress and fevers for a day or 2. Patient continues to be tachypneic and tachycardic and febrile.  He is getting Tylenol and symptomatic management as per palliative care.  Pressure injury on admission  Pressure Injury 12/20/18 Buttocks Right Deep Tissue Injury - Purple or maroon localized area of discolored intact skin or blood-filled blister due to damage of underlying soft tissue from pressure and/or shear. pink blanchable skin with small area in mid (Active)  12/20/18 1200  Location: Buttocks  Location Orientation: Right  Staging: Deep Tissue Injury - Purple or maroon localized area of discolored intact skin or blood-filled blister due to damage of underlying soft tissue from pressure  and/or shear.  Wound Description (Comments): pink blanchable skin with small area in middle that is purple and nonblanchable  Present on Admission: Yes    . Severe protein calorie malnutrition:       DVT prophylaxis: SCD'S Code Status: DNR Family Communication: mom at bedside.  Disposition Plan: Poor prognosis on comfort measures  Consultants:  Palliative care    Procedures: None   Antimicrobials: Zosyn restarted on 01/13/2019   Subjective: He appears to be more calm today.  Not in any kind of distress. Objective: Vitals:   01/14/19 0731 01/14/19 0920 01/14/19 0943 01/14/19 1144  BP: (!) 121/52   130/65  Pulse: (!) 129  (!) 128 (!) 124  Resp: (!) 25  19 (!) 25  Temp: 100 F (37.8 C) (!) 103.1 F (39.5 C)  (!) 102.1 F (38.9 C)  TempSrc: Axillary Axillary  Oral  SpO2: 98%  94% 100%  Weight:      Height:        Intake/Output Summary (Last 24 hours) at 01/14/2019 1547 Last data filed at 01/14/2019 1012 Gross per 24 hour  Intake 720.04 ml  Output 1650 ml  Net -929.96 ml   Filed Weights   01/08/19 0500 01/09/19 0500 01/14/19 0500  Weight: (!) 142.2 kg (!) 142.2 kg (!) 143 kg    Examination:  General exam: Does not appear to be in distress. Respiratory system: Lateral rhonchi and tachypnea present Cardiovascular system: Tachycardic, S1-S2 heard Gastrointestinal system: Abdomen is soft, nondistended, bowel sounds heard l Central nervous system: Alert but not following commands Extremities: Pedal edema present Skin: Deep tissue pressure injury on the right buttock present on admission. Psychiatry: pt is autistic.     Data Reviewed: I have personally reviewed following labs and imaging studies  CBC: Recent Labs  Lab 01/08/19 0500 01/09/19 0500 01/09/19 2130  WBC 12.7* 15.6* 17.4*  NEUTROABS  --   --  14.0*  HGB 10.5* 11.2* 10.8*  HCT 32.8* 34.6* 33.6*  MCV 89.4 88.5 89.1  PLT 478* 573* 956*   Basic Metabolic Panel: Recent Labs  Lab  01/08/19 0500 01/09/19 0500 01/09/19 2130  NA 136 133* 134*  K 4.5 3.8 4.7  CL 99 95* 99  CO2 _0 GLUCOSE 101* 112* 128*  BUN 13 14 23*  CREATININE <0.30* 0.71 0.60*  CALCIUM 9.4 9.9 9.6  MG  --  1.8  --    GFR: Estimated Creatinine Clearance: 211.6 mL/min (A) (by C-G formula based on SCr of 0.6 mg/dL (L)). Liver Function Tests: Recent Labs  Lab 01/09/19 2130  AST 62*  ALT 65*  ALKPHOS 75  BILITOT 0.4  PROT 6.7  ALBUMIN 3.0*   No results for input(s): LIPASE, AMYLASE in the last 168 hours. No results for input(s): AMMONIA in the last 168 hours. Coagulation Profile: No results for input(s): INR, PROTIME in the last 168 hours. Cardiac Enzymes: No results for input(s): CKTOTAL, CKMB, CKMBINDEX, TROPONINI in the last 168 hours. BNP (last 3 results) No results for input(s): PROBNP in the last 8760 hours. HbA1C: No results for input(s): HGBA1C in the last 72 hours. CBG: Recent Labs  Lab 01/08/19 0735 01/08/19 1147 01/08/19 1742 01/09/19 2000 01/10/19 2109  GLUCAP 90 105* 117* 118* 121*   Lipid Profile: No results for input(s): CHOL, HDL, LDLCALC,  TRIG, CHOLHDL, LDLDIRECT in the last 72 hours. Thyroid Function Tests: No results for input(s): TSH, T4TOTAL, FREET4, T3FREE, THYROIDAB in the last 72 hours. Anemia Panel: No results for input(s): VITAMINB12, FOLATE, FERRITIN, TIBC, IRON, RETICCTPCT in the last 72 hours. Sepsis Labs: Recent Labs  Lab 01/09/19 2114 01/10/19 0434  LATICACIDVEN 2.0* 1.6    No results found for this or any previous visit (from the past 240 hour(s)).       Radiology Studies: No results found.      Scheduled Meds: . chlorhexidine gluconate (MEDLINE KIT)  15 mL Mouth Rinse BID  . Chlorhexidine Gluconate Cloth  6 each Topical Q0600  . cloBAZam  60 mg Per Tube BID  . levothyroxine  25 mcg Per Tube Q0600  . LORazepam  1 mg Intravenous Q4H  . mouth rinse  15 mL Mouth Rinse q12n4p  . nystatin   Topical Daily  . sodium  chloride flush  10-40 mL Intracatheter Q12H   Continuous Infusions: . levETIRAcetam 1,000 mg (01/14/19 0927)  . piperacillin-tazobactam (ZOSYN)  IV 3.375 g (01/14/19 0626)     LOS: 76 days        Hosie Poisson, MD [facility] 01/14/2019, 3:47 PM

## 2019-01-14 NOTE — Progress Notes (Signed)
PT Cancellation Note  Patient Details Name: Aaron Vega MRN: [id number] DOB: [birth year]   Cancelled Treatment:    Reason Eval/Treat Not Completed: Other (comment);PT screened, no needs identified, will sign off - Pt has transitioned to comfort care, PT to sign off at this time. Please re-consult PT if needed.   [place] Pager [phone]  Office [phone]    Roxine Caddy D Elonda Husky 01/14/2019, 2:08 PM

## 2019-01-14 NOTE — Progress Notes (Signed)
SL[facility] Cancellation Note  Patient Details Name: Aaron Vega MRN: [id number] DOB: 1995-07-05   Cancelled treatment:       Reason Eval/Treat Not Completed: Other (comment)(pt may transition to comfort care, not managing secretions, please reorder if indicated)   Macario Golds 01/14/2019, 11:37 AM  Kathleen Lime, MS [place] Office [phone]

## 2019-01-14 NOTE — Plan of Care (Signed)

## 2019-01-15 ENCOUNTER — Encounter: Payer: Self-pay | Admitting: Unknown Physician Specialty

## 2019-01-15 MED ORDER — MORPHINE SULFATE (PF) 2 MG/ML IV SOLN
2.0000 mg | INTRAVENOUS | Status: DC
  Administered 2019-01-15 – 2019-01-16 (×11): 2 mg via INTRAVENOUS
  Filled 2019-01-15 (×11): qty 1

## 2019-01-15 MED ORDER — LORAZEPAM 2 MG/ML IJ SOLN
1.0000 mg | INTRAMUSCULAR | Status: DC
  Administered 2019-01-15 – 2019-01-21 (×67): 1 mg via INTRAVENOUS
  Filled 2019-01-15 (×25): qty 1
  Filled 2019-01-15: qty 0.5
  Filled 2019-01-15 (×42): qty 1

## 2019-01-15 NOTE — Progress Notes (Signed)
PROGRESS NOTE    Aaron Vega  EHM:[id number] DOB: [birth year] DOA: 12/13/2018 PCP: [facility]    Brief Narrative:  23 year old autistic male and prior history of epilepsy follows at [facility] neurology, nonverbal at baseline with marked cognitive decline and gait dysfunction with severe obstipation initially presented to the hospital in November with severe obstipation lethargy and vomiting later on he was admitted this time with aspiration pneumonia, metabolic encephalopathy and ileus patient's hospital course was complicated by persistent lactic acidosis, Wernicke's encephalopathy in the setting thing of severe thiamine deficiency confirmed on MRI on 11/16, adynamic ileus and hepatic steatosis evident on CT abdomen and pelvis on 12/22/2018, GBS treated with IVIG, Intubation for respiratory failure in the setting of sepsis, and aspiration pneumonia.  No events overnight, patient's mom does not want to move the patient out of 4 N. at this time. Patient continues to be febrile requiring cooling blanket.  Discussed with the patient's mom at bedside that he is not responding to IV antibiotics and hence will discontinue the antibiotics today.  Patient appears more restless this morning.  Assessment & Plan:   Principal Problem:   Severe sepsis ([place]) Active Problems:   Autistic disorder   Intractable nausea and vomiting   Seizure disorder (HCC)   Constipation in male   Obesity, Class III, BMI 40-49.9 (morbid obesity) ([place])   Ileus (HCC)   Aspiration pneumonia (HCC)   Community acquired pneumonia of left lower lobe of lung   Goals of care, counseling/discussion   Palliative care by specialist   DNR (do not resuscitate) discussion   Protein-calorie malnutrition, severe   Weakness of lower extremity   Aspiration into airway   Pressure injury of skin   Fever   Diarrhea   Wernicke's encephalopathy   GBS (Guillain Barre syndrome) ([place])   Dysphagia   Do not intubate  but use all other measures   Leukocytosis   Sinus tachycardia   Hypotension   Pain   Acute on chronic respiratory failure with hypoxia (HCC)  Acute on chronic respiratory failure  Probably secondary to recurrent aspiration pneumonia. Patient completed a course of antibiotics earlier this admission. It appears that patient continues to have aspiration.    Sinus tachycardia probably secondary to increased work of breathing. Morphine 2 mg every 2 hours ordered.   Nutrition  s/p PEG by IR on 12/8   transition to comfort measures and tube feeds have been stopped.   Hypotension Appears to have resolved.  History of Guillain Barr syndrome Treated with IVIG.  History of chronic epilepsy Resume Keppra and onfi.  Adynamic ileus  Patient has a rectal tube but is not having any bowel movements at this time.   Hypothyroidism:  Continue with Synthroid.  Recent Warnicke encephalopathy Treated with thiamine.    Unfortunately patient continues to be tachypneic, tachycardic with bilateral rhonchi and respiratory distress requiring up to 3 L of nasal cannula oxygen with underlying multiple comorbidities, without any clinical progression to medications or antibiotics, poor functional status, clinical deterioration. Palliative care consulted. Patient's mom transitioned him to comfort measures.   Pressure injury on admission  Pressure Injury 12/20/18 Buttocks Right Deep Tissue Injury - Purple or maroon localized area of discolored intact skin or blood-filled blister due to damage of underlying soft tissue from pressure and/or shear. pink blanchable skin with small area in mid (Active)  12/20/18 1200  Location: Buttocks  Location Orientation: Right  Staging: Deep Tissue Injury - Purple or maroon localized area of discolored intact skin  or blood-filled blister due to damage of underlying soft tissue from pressure and/or shear.  Wound Description (Comments): pink blanchable skin with  small area in middle that is purple and nonblanchable  Present on Admission: Yes    . Severe protein calorie malnutrition:       DVT prophylaxis: SCD'S Code Status: DNR Family Communication: mom at bedside.  Disposition Plan: Poor prognosis on comfort measures  Consultants:  Palliative care    Procedures: None   Antimicrobials: Zosyn restarted on 01/13/2019   Subjective: Patient appears more restless and fidgety today not in any kind of distress. Objective: Vitals:   01/14/19 1912 01/14/19 1953 01/15/19 0400 01/15/19 0734  BP:  (!) 115/56  (!) 127/55  Pulse:  (!) 126  (!) 124  Resp:  (!) 21  (!) 25  Temp: 100 F (37.8 C) 99.3 F (37.4 C) 99.2 F (37.3 C) (!) 102 F (38.9 C)  TempSrc:  Oral Oral Oral  SpO2:  98%  99%  Weight:      Height:       No intake or output data in the 24 hours ending 01/15/19 1100 Filed Weights   01/08/19 0500 01/09/19 0500 01/14/19 0500  Weight: (!) 142.2 kg (!) 142.2 kg (!) 143 kg    Examination:  General exam: Patient appears to be flushed, febrile not in acute distress. Respiratory system: Bilateral rhonchi present, tachypnea present Cardiovascular system: S1-S2 heard, tachycardic, JVD cannot be appreciated, fluid overloaded. Gastrointestinal system: Abdomen is soft, nontender, bowel sounds minimal. Central nervous system: Patient is alert but does not follow commands. Extremities: Leg edema present Skin: Deep tissue pressure injury on the right buttock present on admission. Psychiatry: pt is autistic.     Data Reviewed: I have personally reviewed following labs and imaging studies  CBC: Recent Labs  Lab 01/09/19 0500 01/09/19 2130  WBC 15.6* 17.4*  NEUTROABS  --  14.0*  HGB 11.2* 10.8*  HCT 34.6* 33.6*  MCV 88.5 89.1  PLT 573* 921*   Basic Metabolic Panel: Recent Labs  Lab 01/09/19 0500 01/09/19 2130  NA 133* 134*  K 3.8 4.7  CL 95* 99  CO2 26 23  GLUCOSE 112* 128*  BUN 14 23*  CREATININE 0.71 0.60*    CALCIUM 9.9 9.6  MG 1.8  --    GFR: Estimated Creatinine Clearance: 211.6 mL/min (A) (by C-G formula based on SCr of 0.6 mg/dL (L)). Liver Function Tests: Recent Labs  Lab 01/09/19 2130  AST 62*  ALT 65*  ALKPHOS 75  BILITOT 0.4  PROT 6.7  ALBUMIN 3.0*   No results for input(s): LIPASE, AMYLASE in the last 168 hours. No results for input(s): AMMONIA in the last 168 hours. Coagulation Profile: No results for input(s): INR, PROTIME in the last 168 hours. Cardiac Enzymes: No results for input(s): CKTOTAL, CKMB, CKMBINDEX, TROPONINI in the last 168 hours. BNP (last 3 results) No results for input(s): PROBNP in the last 8760 hours. HbA1C: No results for input(s): HGBA1C in the last 72 hours. CBG: Recent Labs  Lab 01/08/19 1147 01/08/19 1742 01/09/19 2000 01/10/19 2109  GLUCAP 105* 117* 118* 121*   Lipid Profile: No results for input(s): CHOL, HDL, LDLCALC, TRIG, CHOLHDL, LDLDIRECT in the last 72 hours. Thyroid Function Tests: No results for input(s): TSH, T4TOTAL, FREET4, T3FREE, THYROIDAB in the last 72 hours. Anemia Panel: No results for input(s): VITAMINB12, FOLATE, FERRITIN, TIBC, IRON, RETICCTPCT in the last 72 hours. Sepsis Labs: Recent Labs  Lab 01/09/19 2114 01/10/19 0434  LATICACIDVEN 2.0* 1.6    No results found for this or any previous visit (from the past 240 hour(s)).       Radiology Studies: No results found.      Scheduled Meds: . chlorhexidine gluconate (MEDLINE KIT)  15 mL Mouth Rinse BID  . Chlorhexidine Gluconate Cloth  6 each Topical Q0600  . cloBAZam  60 mg Per Tube BID  . levothyroxine  25 mcg Per Tube Q0600  . LORazepam  1 mg Intravenous Q2H  . mouth rinse  15 mL Mouth Rinse q12n4p  .  morphine injection  2 mg Intravenous Q2H  . nystatin   Topical Daily  . sodium chloride flush  10-40 mL Intracatheter Q12H   Continuous Infusions: . levETIRAcetam 1,000 mg (01/15/19 0957)     LOS: 38 days        Hosie Poisson,  MD [facility] 01/15/2019, 11:00 AM

## 2019-01-15 NOTE — Plan of Care (Signed)

## 2019-01-15 NOTE — Progress Notes (Signed)
OT Cancellation and Discharge Note  Patient Details Name: Keanthony Poole MRN: [id number] DOB: January 24, 1996   Cancelled Treatment:    Reason Eval/Treat Not Completed: Medical issues which prohibited therapy.  Pt transitioning to comfort.  Will sign off at this time.  Nilsa Nutting., OTR/L Acute Rehabilitation Services Pager [phone] Office [phone]   Lucille Passy M 01/15/2019, 5:59 AM

## 2019-01-16 DIAGNOSIS — R0609 Other forms of dyspnea: Secondary | ICD-10-CM

## 2019-01-16 DIAGNOSIS — R0682 Tachypnea, not elsewhere classified: Secondary | ICD-10-CM

## 2019-01-16 DIAGNOSIS — K117 Disturbances of salivary secretion: Secondary | ICD-10-CM

## 2019-01-16 DIAGNOSIS — R0600 Dyspnea, unspecified: Secondary | ICD-10-CM

## 2019-01-16 MED ORDER — MORPHINE 100MG IN NS 100ML (1MG/ML) PREMIX INFUSION
1.0000 mg/h | INTRAVENOUS | Status: DC
  Administered 2019-01-16 – 2019-01-22 (×3): 1 mg/h via INTRAVENOUS
  Administered 2019-01-25: 4 mg/h via INTRAVENOUS
  Administered 2019-01-26: 5 mg/h via INTRAVENOUS
  Filled 2019-01-16 (×6): qty 100

## 2019-01-16 MED ORDER — MORPHINE BOLUS VIA INFUSION
1.0000 mg | INTRAVENOUS | Status: DC | PRN
  Filled 2019-01-16: qty 1

## 2019-01-16 MED ORDER — GLYCOPYRROLATE 0.2 MG/ML IJ SOLN
0.4000 mg | Freq: Four times a day (QID) | INTRAMUSCULAR | Status: DC | PRN
  Administered 2019-01-26: 0.4 mg via INTRAVENOUS
  Filled 2019-01-16: qty 2

## 2019-01-16 MED ORDER — MORPHINE BOLUS VIA INFUSION
1.0000 mg | INTRAVENOUS | Status: DC | PRN
  Administered 2019-01-16 – 2019-01-20 (×4): 1 mg via INTRAVENOUS
  Filled 2019-01-16: qty 1

## 2019-01-16 NOTE — Progress Notes (Signed)
Patient ID: Aaron Vega, male   DOB: 1995-07-17, 23 y.o.   MRN: [id number]  This NP visited patient at the bedside as a follow up for palliative medicine needs and emotional support.  This nurse practitioner has been in collaboration with attendings over the last few days to offer support to attending team.  Today I met with the patient's mother at bedside.  Decision has been made for a comfort approach, currently patient is on a morphine drip.  Hershy appears comfortable  Created space and opportunity for patient's mother to explore thoughts and feelings regarding her current medical situation.           Active listening and presence.   Mother continues to verbalize her struggle with internalizing her son's mortality.  She tells me on 1 hand she understands the limited prognosis and yet on the other hand she continues to hold onto hope for improvement.  Ultimately her hope is for comfort and dignity for her son.  Emotional support offered.    Adjustments for symptoms   - added morphine bolus via infusion 1 mg every 30 minutes as needed for dyspnea or pain, if patient requires 2 boluses in 1 hour increase basal rate by 50%. -Morphine drip may titrate for comfort to maximum of 10 mg/h - Robinul 0.4 mg IV every 6 hours as needed for terminal secretions   Questions and concerns addressed   Discussed with Dr Karleen Hampshire and bedside RN/Carolyn  Total time spent on the unit was 40 minutes  PMT will continue to support holistically.  Greater than 50% of the time was spent in counseling and coordination of care.  Wadie Lessen NP  Palliative Medicine Team Team Phone # [phone] Pager [phone]

## 2019-01-16 NOTE — Progress Notes (Signed)
PROGRESS NOTE    Aaron Vega  UKG:[id number] DOB: 09-25-1995 DOA: 12/07/2018 PCP: [facility]    Brief Narrative:  23 year old autistic male and prior history of epilepsy follows at [facility] neurology, nonverbal at baseline with marked cognitive decline and gait dysfunction with severe obstipation initially presented to the hospital in November with severe obstipation lethargy and vomiting later on he was admitted this time with aspiration pneumonia, metabolic encephalopathy and ileus patient's hospital course was complicated by persistent lactic acidosis, Wernicke's encephalopathy in the setting thing of severe thiamine deficiency confirmed on MRI on 11/16, adynamic ileus and hepatic steatosis evident on CT abdomen and pelvis on 12/22/2018, GBS treated with IVIG, Intubation for respiratory failure in the setting of sepsis, and aspiration pneumonia.   patient's mom does not want to move the patient out of 4 N. at this time.  Discussed with the patient's mom at bedside that he is not responding to IV antibiotics and hence  discontinue the antibiotics .  Patient's mom decided for a comfort approach and and agreed that her main focus is her son's comfort and dignity.  Patient has been on morphine 2 mg every 2 hours scheduled and appears comfortable . Transitioned to morphine drip after discussing with the mother and palliative care.  Palliative care to make adjustments to the morphine drip as per patient's requirement.  Assessment & Plan:   Principal Problem:   Severe sepsis ([place]) Active Problems:   Autistic disorder   Intractable nausea and vomiting   Seizure disorder (HCC)   Constipation in male   Obesity, Class III, BMI 40-49.9 (morbid obesity) ([place])   Ileus (HCC)   Aspiration pneumonia (HCC)   Community acquired pneumonia of left lower lobe of lung   Goals of care, counseling/discussion   Palliative care by specialist   DNR (do not resuscitate) discussion  Protein-calorie malnutrition, severe   Weakness of lower extremity   Aspiration into airway   Pressure injury of skin   Fever   Diarrhea   Wernicke's encephalopathy   GBS (Guillain Barre syndrome) ([place])   Dysphagia   Do not intubate but use all other measures   Leukocytosis   Sinus tachycardia   Hypotension   Pain   Acute on chronic respiratory failure with hypoxia (HCC)  Acute on chronic respiratory failure  Probably secondary to recurrent aspiration pneumonia. Patient completed a course of antibiotics earlier this admission. It appears that patient continues to have aspirate, . He is currently on 4 lit of [place] oxygen.  He appears comfortable.     Sinus tachycardia probably secondary to increased work of breathing. Improved with morphine .    Nutrition  s/p PEG by IR on 12/8   transitioned to comfort measures and tube feeds have been stopped.   Hypotension Appears to have resolved.   History of Guillain Barr syndrome Treated with IVIG.  History of chronic epilepsy Resume Keppra and onfi.  Adynamic ileus  Patient has a rectal tube but is not having any bowel movements at this time. As per RN, some brown discharge leaking from the flexiseal.    Hypothyroidism:  Continue with Synthroid.  Recent Warnicke encephalopathy Treated with thiamine.    Unfortunately patient continues to be tachypneic, tachycardic with bilateral rhonchi and respiratory distress requiring up to 3 L of nasal cannula oxygen with underlying multiple comorbidities, without any clinical progression to medications or antibiotics, poor functional status, clinical deterioration. Palliative care consulted. Patient's mom transitioned him to comfort measures.he is currently on morphine  gtt and appears comfortable.  Appreciate palliative care input and support.    Pressure injury on admission  Pressure Injury 12/20/18 Buttocks Right Deep Tissue Injury - Purple or maroon localized area of discolored  intact skin or blood-filled blister due to damage of underlying soft tissue from pressure and/or shear. pink blanchable skin with small area in mid (Active)  12/20/18 1200  Location: Buttocks  Location Orientation: Right  Staging: Deep Tissue Injury - Purple or maroon localized area of discolored intact skin or blood-filled blister due to damage of underlying soft tissue from pressure and/or shear.  Wound Description (Comments): pink blanchable skin with small area in middle that is purple and nonblanchable  Present on Admission: Yes    . Severe protein calorie malnutrition:       DVT prophylaxis: SCD'S Code Status: DNR Family Communication: mom at bedside.  Disposition Plan: Poor prognosis on comfort measures  Consultants:  Palliative care    Procedures: None   Antimicrobials: Zosyn restarted on 01/13/2019 discontinued on 12/22   Subjective: Pt appears comfortable.  Objective: Vitals:   01/15/19 1221 01/15/19 1620 01/15/19 2031 01/16/19 0915  BP: (!) 147/80 135/61 128/74 106/60  Pulse: (!) 119 (!) 117 (!) 118 (!) 133  Resp: 18 18  (!) 24  Temp: 99.5 F (37.5 C) 97.6 F (36.4 C) 99.2 F (37.3 C) 100 F (37.8 C)  TempSrc: Oral Oral  Oral  SpO2: 99% 99%  100%  Weight:      Height:        Intake/Output Summary (Last 24 hours) at 01/16/2019 1703 Last data filed at 01/16/2019 1600 Gross per 24 hour  Intake 104.16 ml  Output -  Net 104.16 ml   Filed Weights   01/08/19 0500 01/09/19 0500 01/14/19 0500  Weight: (!) 142.2 kg (!) 142.2 kg (!) 143 kg    Examination:  General exam: pt appears comfortable, on [place] oxygen and sleeping.  Respiratory system: diminished air entry at bases, no wheezing heard.  Cardiovascular system: s1s2, tachycardic.  Gastrointestinal system: abd is soft, minimal bowel sounds.  Central nervous system: pt sleeping comfortablly.  Extremities: Leg edema present Skin: Deep tissue pressure injury on the right buttock present on  admission. Psychiatry: pt is autistic.     Data Reviewed: I have personally reviewed following labs and imaging studies  CBC: Recent Labs  Lab 01/09/19 2130  WBC 17.4*  NEUTROABS 14.0*  HGB 10.8*  HCT 33.6*  MCV 89.1  PLT 824*   Basic Metabolic Panel: Recent Labs  Lab 01/09/19 2130  NA 134*  K 4.7  CL 99  CO2 23  GLUCOSE 128*  BUN 23*  CREATININE 0.60*  CALCIUM 9.6   GFR: Estimated Creatinine Clearance: 211.6 mL/min (A) (by C-G formula based on SCr of 0.6 mg/dL (L)). Liver Function Tests: Recent Labs  Lab 01/09/19 2130  AST 62*  ALT 65*  ALKPHOS 75  BILITOT 0.4  PROT 6.7  ALBUMIN 3.0*   No results for input(s): LIPASE, AMYLASE in the last 168 hours. No results for input(s): AMMONIA in the last 168 hours. Coagulation Profile: No results for input(s): INR, PROTIME in the last 168 hours. Cardiac Enzymes: No results for input(s): CKTOTAL, CKMB, CKMBINDEX, TROPONINI in the last 168 hours. BNP (last 3 results) No results for input(s): PROBNP in the last 8760 hours. HbA1C: No results for input(s): HGBA1C in the last 72 hours. CBG: Recent Labs  Lab 01/09/19 2000 01/10/19 2109  GLUCAP 118* 121*   Lipid Profile: No  results for input(s): CHOL, HDL, LDLCALC, TRIG, CHOLHDL, LDLDIRECT in the last 72 hours. Thyroid Function Tests: No results for input(s): TSH, T4TOTAL, FREET4, T3FREE, THYROIDAB in the last 72 hours. Anemia Panel: No results for input(s): VITAMINB12, FOLATE, FERRITIN, TIBC, IRON, RETICCTPCT in the last 72 hours. Sepsis Labs: Recent Labs  Lab 01/09/19 2114 01/10/19 0434  LATICACIDVEN 2.0* 1.6    No results found for this or any previous visit (from the past 240 hour(s)).       Radiology Studies: No results found.      Scheduled Meds: . chlorhexidine gluconate (MEDLINE KIT)  15 mL Mouth Rinse BID  . Chlorhexidine Gluconate Cloth  6 each Topical Q0600  . cloBAZam  60 mg Per Tube BID  . levothyroxine  25 mcg Per Tube Q0600  .  LORazepam  1 mg Intravenous Q2H  . mouth rinse  15 mL Mouth Rinse q12n4p  . nystatin   Topical Daily  . sodium chloride flush  10-40 mL Intracatheter Q12H   Continuous Infusions: . levETIRAcetam 1,000 mg (01/16/19 1036)  . morphine 1 mg/hr (01/16/19 1148)     LOS: 31 days        Hosie Poisson, MD [facility] 01/16/2019, 5:03 PM

## 2019-01-17 NOTE — Progress Notes (Signed)
PROGRESS NOTE    Aaron Vega  FVC:[id number] DOB: 1995/07/28 DOA: 12/16/2018 PCP: [facility]    Brief Narrative:  23 year old autistic male and prior history of epilepsy follows at [facility] neurology, nonverbal at baseline with marked cognitive decline and gait dysfunction with severe obstipation initially presented to the hospital in November with severe obstipation lethargy and vomiting later on he was admitted this time with aspiration pneumonia, metabolic encephalopathy and ileus patient's hospital course was complicated by persistent lactic acidosis, Wernicke's encephalopathy in the setting thing of severe thiamine deficiency confirmed on MRI on 11/16, adynamic ileus and hepatic steatosis evident on CT abdomen and pelvis on 12/22/2018, GBS treated with IVIG, Intubation for respiratory failure in the setting of sepsis, and aspiration pneumonia.   patient's mom does not want to move the patient out of 4 N. at this time.  Discussed with the patient's mom at bedside that he is not responding to IV antibiotics and hence  discontinue the antibiotics .  Patient's mom decided for a comfort approach and and agreed that her main focus is her son's comfort and dignity.  Patient has been on morphine 2 mg every 2 hours scheduled and appears comfortable . Transitioned to morphine drip after discussing with the mother and palliative care.  Palliative care to make adjustments to the morphine drip as per patient's requirement.  No new events overnight. He appears to be more comfortable, with morphine gtt.  RN reports there is urine leak around the catheter and plan to replace it.   Assessment & Plan:   Principal Problem:   Severe sepsis ([place]) Active Problems:   Autistic disorder   Intractable nausea and vomiting   Seizure disorder (HCC)   Constipation in male   Obesity, Class III, BMI 40-49.9 (morbid obesity) ([place])   Ileus (HCC)   Aspiration pneumonia (HCC)   Community  acquired pneumonia of left lower lobe of lung   Goals of care, counseling/discussion   Palliative care by specialist   DNR (do not resuscitate) discussion   Protein-calorie malnutrition, severe   Weakness of lower extremity   Aspiration into airway   Pressure injury of skin   Fever   Diarrhea   Wernicke's encephalopathy   GBS (Guillain Barre syndrome) ([place])   Dysphagia   Do not intubate but use all other measures   Leukocytosis   Sinus tachycardia   Hypotension   Pain   Acute on chronic respiratory failure with hypoxia (HCC)   Dyspnea   Tachypnea   Increased oropharyngeal secretions  Acute on chronic respiratory failure  Probably secondary to recurrent aspiration pneumonia. Patient completed a course of antibiotics earlier this admission. It appears that patient continues to have aspirate, . He is currently on 4 lit of [place] oxygen.  He appears to be comfortable. Fevers resolved.     Sinus tachycardia probably secondary to increased work of breathing. Improved with morphine .    Nutrition  s/p PEG by IR on 12/8   transitioned to comfort measures and tube feeds have been stopped.   Hypotension Appears to have resolved.   History of Guillain Barr syndrome Treated with IVIG.  History of chronic epilepsy Resume keppra and onfi.   Adynamic ileus  Patient has a rectal tube but is not having any bowel movements at this time. As per RN, some brown discharge leaking from the flexiseal.   Urinary retention:  Foley to be replaced today due to leak.    Hypothyroidism:  Continue with Synthroid.  Recent  Warnicke encephalopathy Treated with thiamine.    Unfortunately patient continues to be tachypneic, tachycardic with bilateral rhonchi and respiratory distress requiring up to 3 L of nasal cannula oxygen with underlying multiple comorbidities, without any clinical progression to medications or antibiotics, poor functional status, clinical deterioration. Palliative care  consulted. Patient's mom transitioned him to comfort measures.he is currently on morphine gtt and appears comfortable.  Appreciate palliative care input and support.    Pressure injury on admission  Pressure Injury 12/20/18 Buttocks Right Deep Tissue Injury - Purple or maroon localized area of discolored intact skin or blood-filled blister due to damage of underlying soft tissue from pressure and/or shear. pink blanchable skin with small area in mid (Active)  12/20/18 1200  Location: Buttocks  Location Orientation: Right  Staging: Deep Tissue Injury - Purple or maroon localized area of discolored intact skin or blood-filled blister due to damage of underlying soft tissue from pressure and/or shear.  Wound Description (Comments): pink blanchable skin with small area in middle that is purple and nonblanchable  Present on Admission: Yes    . Severe protein calorie malnutrition:       DVT prophylaxis: SCD'S Code Status: DNR Family Communication: mom at bedside.  Disposition Plan: Poor prognosis on comfort measures  Consultants:  Palliative care    Procedures: None   Antimicrobials: Zosyn restarted on 01/13/2019 discontinued on 12/22   Subjective:  Pt appears comfortable.  Objective: Vitals:   01/17/19 0345 01/17/19 0910 01/17/19 1510 01/17/19 1522  BP:  (!) 119/51    Pulse:  (!) 127    Resp:  16  19  Temp:  99.5 F (37.5 C) (!) 102.6 F (39.2 C)   TempSrc:  Oral Oral   SpO2:  99%  99%  Weight: 132.3 kg     Height:        Intake/Output Summary (Last 24 hours) at 01/17/2019 1707 Last data filed at 01/17/2019 0802 Gross per 24 hour  Intake --  Output 150 ml  Net -150 ml   Filed Weights   01/09/19 0500 01/14/19 0500 01/17/19 0345  Weight: (!) 142.2 kg (!) 143 kg 132.3 kg    Examination:  General exam: Pt appears comfortable.  Respiratory system: diminished air entry at bases,  Cardiovascular system: S1S2, tachycardic,  Gastrointestinal system: abd is  soft, bowel sounds minimal  Central nervous system: able to open eyes on verbal cues.  Extremities: trace edema.  Skin: Deep tissue pressure injury on the right buttock present on admission. Psychiatry: pt is autistic.     Data Reviewed: I have personally reviewed following labs and imaging studies  CBC: No results for input(s): WBC, NEUTROABS, HGB, HCT, MCV, PLT in the last 168 hours. Basic Metabolic Panel: No results for input(s): NA, K, CL, CO2, GLUCOSE, BUN, CREATININE, CALCIUM, MG, PHOS in the last 168 hours. GFR: Estimated Creatinine Clearance: 202.7 mL/min (A) (by C-G formula based on SCr of 0.6 mg/dL (L)). Liver Function Tests: No results for input(s): AST, ALT, ALKPHOS, BILITOT, PROT, ALBUMIN in the last 168 hours. No results for input(s): LIPASE, AMYLASE in the last 168 hours. No results for input(s): AMMONIA in the last 168 hours. Coagulation Profile: No results for input(s): INR, PROTIME in the last 168 hours. Cardiac Enzymes: No results for input(s): CKTOTAL, CKMB, CKMBINDEX, TROPONINI in the last 168 hours. BNP (last 3 results) No results for input(s): PROBNP in the last 8760 hours. HbA1C: No results for input(s): HGBA1C in the last 72 hours. CBG: Recent Labs  Lab 01/10/19 2109  GLUCAP 121*   Lipid Profile: No results for input(s): CHOL, HDL, LDLCALC, TRIG, CHOLHDL, LDLDIRECT in the last 72 hours. Thyroid Function Tests: No results for input(s): TSH, T4TOTAL, FREET4, T3FREE, THYROIDAB in the last 72 hours. Anemia Panel: No results for input(s): VITAMINB12, FOLATE, FERRITIN, TIBC, IRON, RETICCTPCT in the last 72 hours. Sepsis Labs: No results for input(s): PROCALCITON, LATICACIDVEN in the last 168 hours.  No results found for this or any previous visit (from the past 240 hour(s)).       Radiology Studies: No results found.      Scheduled Meds: . chlorhexidine gluconate (MEDLINE KIT)  15 mL Mouth Rinse BID  . Chlorhexidine Gluconate Cloth  6 each  Topical Q0600  . cloBAZam  60 mg Per Tube BID  . levothyroxine  25 mcg Per Tube Q0600  . LORazepam  1 mg Intravenous Q2H  . mouth rinse  15 mL Mouth Rinse q12n4p  . nystatin   Topical Daily  . sodium chloride flush  10-40 mL Intracatheter Q12H   Continuous Infusions: . levETIRAcetam 1,000 mg (01/17/19 0949)  . morphine 1 mg/hr (01/16/19 1148)     LOS: 40 days        Hosie Poisson, MD [facility] 01/17/2019, 5:07 PM

## 2019-01-17 NOTE — Plan of Care (Addendum)
  Problem: Skin Integrity: Goal: Risk for impaired skin integrity will decrease Outcome: Progressing Note: Foley catheter leaking at site causing continuous moisture, paged MD, Foley removed and reinserted per order for comfort care and to maintain skin integrity. Will continue to monitor.

## 2019-01-18 NOTE — Progress Notes (Signed)
PROGRESS NOTE    Aaron Vega  [name] DOB: 1995/08/10 DOA: 12/17/2018 PCP: [facility]    Brief Narrative:  23 year old autistic male and prior history of epilepsy follows at [facility] neurology, nonverbal at baseline with marked cognitive decline and gait dysfunction with severe obstipation initially presented to the hospital in November with severe obstipation lethargy and vomiting later on he was admitted this time with aspiration pneumonia, metabolic encephalopathy and ileus patient's hospital course was complicated by persistent lactic acidosis, Wernicke's encephalopathy in the setting thing of severe thiamine deficiency confirmed on MRI on 11/16, adynamic ileus and hepatic steatosis evident on CT abdomen and pelvis on 12/22/2018, GBS treated with IVIG, Intubation for respiratory failure in the setting of sepsis, and aspiration pneumonia.   patient's mom does not want to move the patient out of 4 N. at this time.  Discussed with the patient's mom at bedside that he is not responding to IV antibiotics and hence  discontinue the antibiotics .  Patient's mom decided for a comfort approach and and agreed that her main focus is her son's comfort and dignity.  Patient has been on morphine 2 mg every 2 hours scheduled and appears comfortable . Transitioned to morphine drip after discussing with the mother and palliative care.  Palliative care to make adjustments to the morphine drip as per patient's requirement.  No new events overnight. He appears to be more comfortable, with morphine gtt.  RN reports there is urine leak around the catheter and plan to replace it. Foley replaced.   Assessment & Plan:   Principal Problem:   Severe sepsis ([place]) Active Problems:   Autistic disorder   Intractable nausea and vomiting   Seizure disorder (HCC)   Constipation in male   Obesity, Class III, BMI 40-49.9 (morbid obesity) ([place])   Ileus (HCC)   Aspiration pneumonia (HCC)   Community acquired pneumonia of left lower lobe of lung   Goals of care, counseling/discussion   Palliative care by specialist   DNR (do not resuscitate) discussion   Protein-calorie malnutrition, severe   Weakness of lower extremity   Aspiration into airway   Pressure injury of skin   Fever   Diarrhea   Wernicke's encephalopathy   GBS (Guillain Barre syndrome) ([place])   Dysphagia   Do not intubate but use all other measures   Leukocytosis   Sinus tachycardia   Hypotension   Pain   Acute on chronic respiratory failure with hypoxia (HCC)   Dyspnea   Tachypnea   Increased oropharyngeal secretions  Acute on chronic respiratory failure  Probably secondary to recurrent aspiration pneumonia. Patient completed a course of antibiotics earlier this admission. It appears that patient continues to have aspirate, . He is currently on 4 lit of [place] oxygen.  He appears to be comfortable. Fevers resolved.  No new events overnight    Sinus tachycardia probably secondary to increased work of breathing. Improved with morphine   Nutrition  s/p PEG by IR on 12/8   transitioned to comfort measures and tube feeds have been stopped.   Hypotension Appears to have resolved  History of Guillain Barr syndrome Treated with IVIG  History of chronic epilepsy Given Keppra and Onfi  Adynamic ileus  Appears to have resolved Patient has a rectal tube no bowel movements at this time, but bowel sounds are heard As per RN, some brown discharge leaking from the flexiseal.   Urinary retention:  We will be replaced   Hypothyroidism:  Continue with Synthroid  Recent Warnicke encephalopathy Treated with thiamine.    Unfortunately patient continues to be tachypneic, tachycardic with bilateral rhonchi and respiratory distress requiring up to 3 L of nasal cannula oxygen with underlying multiple comorbidities, without any clinical progression to medications or antibiotics, poor functional status,  clinical deterioration. Palliative care consulted. Patient's mom transitioned him to comfort measures.he is currently on morphine gtt and appears comfortable.  Appreciate palliative care input and support.  No new changes in his status.   Pressure injury on admission  Pressure Injury 12/20/18 Buttocks Right Deep Tissue Injury - Purple or maroon localized area of discolored intact skin or blood-filled blister due to damage of underlying soft tissue from pressure and/or shear. pink blanchable skin with small area in mid (Active)  12/20/18 1200  Location: Buttocks  Location Orientation: Right  Staging: Deep Tissue Injury - Purple or maroon localized area of discolored intact skin or blood-filled blister due to damage of underlying soft tissue from pressure and/or shear.  Wound Description (Comments): pink blanchable skin with small area in middle that is purple and nonblanchable  Present on Admission: Yes    . Severe protein calorie malnutrition:  Not taking anything by mouth     DVT prophylaxis: SCD'S Code Status: DNR Family Communication: mom at bedside.  Disposition Plan: Poor prognosis on comfort measures  Consultants:  Palliative care    Procedures: None   Antimicrobials: Zosyn restarted on 01/13/2019 discontinued on 12/22   Subjective: Patient appears comfortable Objective: Vitals:   01/17/19 1522 01/17/19 2045 01/18/19 0500 01/18/19 0900  BP:  127/66  (!) 118/53  Pulse:  (!) 125  (!) 113  Resp: _0 Temp:  99.2 F (37.3 C)  99.5 F (37.5 C)  TempSrc:  Oral  Axillary  SpO2: 99% 100%  99%  Weight:   131.6 kg   Height:        Intake/Output Summary (Last 24 hours) at 01/18/2019 1340 Last data filed at 01/18/2019 0756 Gross per 24 hour  Intake 0 ml  Output 500 ml  Net -500 ml   Filed Weights   01/14/19 0500 01/17/19 0345 01/18/19 0500  Weight: (!) 143 kg 132.3 kg 131.6 kg    Examination:  General exam: comfortable, not in any distress.    Respiratory system: Diminished air entry at bases, no wheezing or rhonchi Cardiovascular system: S1-S2 heard, tachycardic Gastrointestinal system: Abdomen is soft does not appear to be tender, nondistended, bowel sounds are heard Central nervous system: able to open eyes on verbal cues. Has persistent vertical nystagmus.  Extremities: Trace pedal edema Skin: Deep tissue pressure injury on the right buttock present on admission. Psychiatry: pt is autistic.     Data Reviewed: I have personally reviewed following labs and imaging studies  CBC: No results for input(s): WBC, NEUTROABS, HGB, HCT, MCV, PLT in the last 168 hours. Basic Metabolic Panel: No results for input(s): NA, K, CL, CO2, GLUCOSE, BUN, CREATININE, CALCIUM, MG, PHOS in the last 168 hours. GFR: Estimated Creatinine Clearance: 202.1 mL/min (A) (by C-G formula based on SCr of 0.6 mg/dL (L)). Liver Function Tests: No results for input(s): AST, ALT, ALKPHOS, BILITOT, PROT, ALBUMIN in the last 168 hours. No results for input(s): LIPASE, AMYLASE in the last 168 hours. No results for input(s): AMMONIA in the last 168 hours. Coagulation Profile: No results for input(s): INR, PROTIME in the last 168 hours. Cardiac Enzymes: No results for input(s): CKTOTAL, CKMB, CKMBINDEX, TROPONINI in the last 168 hours. BNP (last 3  results) No results for input(s): PROBNP in the last 8760 hours. HbA1C: No results for input(s): HGBA1C in the last 72 hours. CBG: No results for input(s): GLUCAP in the last 168 hours. Lipid Profile: No results for input(s): CHOL, HDL, LDLCALC, TRIG, CHOLHDL, LDLDIRECT in the last 72 hours. Thyroid Function Tests: No results for input(s): TSH, T4TOTAL, FREET4, T3FREE, THYROIDAB in the last 72 hours. Anemia Panel: No results for input(s): VITAMINB12, FOLATE, FERRITIN, TIBC, IRON, RETICCTPCT in the last 72 hours. Sepsis Labs: No results for input(s): PROCALCITON, LATICACIDVEN in the last 168 hours.  No results  found for this or any previous visit (from the past 240 hour(s)).       Radiology Studies: No results found.      Scheduled Meds: . chlorhexidine gluconate (MEDLINE KIT)  15 mL Mouth Rinse BID  . Chlorhexidine Gluconate Cloth  6 each Topical Q0600  . cloBAZam  60 mg Per Tube BID  . levothyroxine  25 mcg Per Tube Q0600  . LORazepam  1 mg Intravenous Q2H  . mouth rinse  15 mL Mouth Rinse q12n4p  . nystatin   Topical Daily  . sodium chloride flush  10-40 mL Intracatheter Q12H   Continuous Infusions: . levETIRAcetam 1,000 mg (01/18/19 0957)  . morphine 1 mg/hr (01/16/19 1148)     LOS: 41 days        Hosie Poisson, MD [facility] 01/18/2019, 1:40 PM

## 2019-01-19 NOTE — Progress Notes (Signed)
PROGRESS NOTE    Aaron Vega  MPN:[id number] DOB: October 11, 1995 DOA: 11/30/2018 PCP: [facility]    Brief Narrative:  23 year old autistic male and prior history of epilepsy follows at [facility] neurology, nonverbal at baseline with marked cognitive decline and gait dysfunction with severe obstipation initially presented to the hospital in November with severe obstipation lethargy and vomiting later on he was admitted this time with aspiration pneumonia, metabolic encephalopathy and ileus patient's hospital course was complicated by persistent lactic acidosis, Wernicke's encephalopathy in the setting thing of severe thiamine deficiency confirmed on MRI on 11/16, adynamic ileus and hepatic steatosis evident on CT abdomen and pelvis on 12/22/2018, GBS treated with IVIG, Intubation for respiratory failure in the setting of sepsis, and aspiration pneumonia.   patient's mom does not want to move the patient out of 4 N. at this time.  Discussed with the patient's mom at bedside that he is not responding to IV antibiotics and hence  discontinue the antibiotics .  Patient's mom decided for a comfort approach and and agreed that her main focus is her son's comfort and dignity.  Patient has been on morphine 2 mg every 2 hours scheduled and appears comfortable . Transitioned to morphine drip after discussing with the mother and palliative care.  Palliative care to make adjustments to the morphine drip as per patient's requirement.  No new events overnight.  Patient was febrile this morning to 103 today appears comfortable.  Assessment & Plan:   Principal Problem:   Severe sepsis ([place]) Active Problems:   Autistic disorder   Intractable nausea and vomiting   Seizure disorder (HCC)   Constipation in male   Obesity, Class III, BMI 40-49.9 (morbid obesity) ([place])   Ileus (HCC)   Aspiration pneumonia (HCC)   Community acquired pneumonia of left lower lobe of lung   Goals of care,  counseling/discussion   Palliative care by specialist   DNR (do not resuscitate) discussion   Protein-calorie malnutrition, severe   Weakness of lower extremity   Aspiration into airway   Pressure injury of skin   Fever   Diarrhea   Wernicke's encephalopathy   GBS (Guillain Barre syndrome) ([place])   Dysphagia   Do not intubate but use all other measures   Leukocytosis   Sinus tachycardia   Hypotension   Pain   Acute on chronic respiratory failure with hypoxia (HCC)   Dyspnea   Tachypnea   Increased oropharyngeal secretions  Acute on chronic respiratory failure  Probably secondary to recurrent aspiration pneumonia. Patient completed a course of antibiotics earlier this admission. It appears that patient continues to have aspirate, . He is currently on 4 lit of [place] oxygen.  Patient appears to be comfortable no new events overnight.    Sinus tachycardia probably secondary to increased work of breathing. His heart rate still in 130s but he does not appear to be symptomatic or in distress.   Nutrition  s/p PEG by IR on 12/8  Transition to comfort measures and tube feeds have been stopped.   Hypotension Appears to have resolved  History of Guillain Barr syndrome Treated with IVIG  History of chronic epilepsy Continue with Keppra and Onfi  Adynamic ileus  Appears to have resolved Patient has a rectal tube, no bowel movements no stool in the bag.  Bowel sounds are heard. As per RN, some brown discharge leaking from the flexiseal.   Urinary retention:  Foley catheter was later placed yesterday.   Hypothyroidism:  Continue with Synthroid  Recent Warnicke encephalopathy Treated with thiamine.    Unfortunately patient continues to be tachypneic, tachycardic with bilateral rhonchi and respiratory distress requiring up to 3 L of nasal cannula oxygen with underlying multiple comorbidities, without any clinical progression to medications or antibiotics, poor functional  status, clinical deterioration. Palliative care consulted. Patient's mom transitioned him to comfort measures.he is currently on morphine gtt and appears comfortable.  Appreciate palliative care input and support.  No change in his status at this time.   Pressure injury on admission  Pressure Injury 12/20/18 Buttocks Right Deep Tissue Injury - Purple or maroon localized area of discolored intact skin or blood-filled blister due to damage of underlying soft tissue from pressure and/or shear. pink blanchable skin with small area in mid (Active)  12/20/18 1200  Location: Buttocks  Location Orientation: Right  Staging: Deep Tissue Injury - Purple or maroon localized area of discolored intact skin or blood-filled blister due to damage of underlying soft tissue from pressure and/or shear.  Wound Description (Comments): pink blanchable skin with small area in middle that is purple and nonblanchable  Present on Admission: Yes    . Severe protein calorie malnutrition:  Not taking anything by mouth     DVT prophylaxis: SCD'S Code Status: DNR Family Communication: mom at bedside.  Disposition Plan: Poor prognosis on comfort measures  Consultants:  Palliative care    Procedures: None   Antimicrobials: Zosyn restarted on 01/13/2019 discontinued on 12/22   Subjective: Patient appears to be comfortable. Objective: Vitals:   01/18/19 2300 01/19/19 0500 01/19/19 0548 01/19/19 1125  BP: (!) 101/58   (!) 118/50  Pulse:    (!) 130  Resp: 15   15  Temp: 99.7 F (37.6 C)  (!) 103 F (39.4 C) (!) 102.5 F (39.2 C)  TempSrc: Oral  Oral Axillary  SpO2:    100%  Weight:  129.8 kg    Height:        Intake/Output Summary (Last 24 hours) at 01/19/2019 1547 Last data filed at 01/19/2019 0529 Gross per 24 hour  Intake 62.06 ml  Output 725 ml  Net -662.94 ml   Filed Weights   01/17/19 0345 01/18/19 0500 01/19/19 0500  Weight: 132.3 kg 131.6 kg 129.8 kg    Examination:  General  exam: alert and comfortable , not in distress.  Respiratory system: diminished at bases, no wheezing  Cardiovascular system: S1-S2 heard, tachycardic. Gastrointestinal system:soft, non tender bowel sounds heard.   Central nervous system: pt is alert and comfortable, non verbal.  Extremities: trace pedal edema, edema of the upper extremities.  Skin: Deep tissue pressure injury on the right buttock present on admission. Psychiatry: pt is autistic.     Data Reviewed: I have personally reviewed following labs and imaging studies  CBC: No results for input(s): WBC, NEUTROABS, HGB, HCT, MCV, PLT in the last 168 hours. Basic Metabolic Panel: No results for input(s): NA, K, CL, CO2, GLUCOSE, BUN, CREATININE, CALCIUM, MG, PHOS in the last 168 hours. GFR: Estimated Creatinine Clearance: 200.6 mL/min (A) (by C-G formula based on SCr of 0.6 mg/dL (L)). Liver Function Tests: No results for input(s): AST, ALT, ALKPHOS, BILITOT, PROT, ALBUMIN in the last 168 hours. No results for input(s): LIPASE, AMYLASE in the last 168 hours. No results for input(s): AMMONIA in the last 168 hours. Coagulation Profile: No results for input(s): INR, PROTIME in the last 168 hours. Cardiac Enzymes: No results for input(s): CKTOTAL, CKMB, CKMBINDEX, TROPONINI in the last 168 hours. BNP (last  3 results) No results for input(s): PROBNP in the last 8760 hours. HbA1C: No results for input(s): HGBA1C in the last 72 hours. CBG: No results for input(s): GLUCAP in the last 168 hours. Lipid Profile: No results for input(s): CHOL, HDL, LDLCALC, TRIG, CHOLHDL, LDLDIRECT in the last 72 hours. Thyroid Function Tests: No results for input(s): TSH, T4TOTAL, FREET4, T3FREE, THYROIDAB in the last 72 hours. Anemia Panel: No results for input(s): VITAMINB12, FOLATE, FERRITIN, TIBC, IRON, RETICCTPCT in the last 72 hours. Sepsis Labs: No results for input(s): PROCALCITON, LATICACIDVEN in the last 168 hours.  No results found for  this or any previous visit (from the past 240 hour(s)).       Radiology Studies: No results found.      Scheduled Meds: . chlorhexidine gluconate (MEDLINE KIT)  15 mL Mouth Rinse BID  . Chlorhexidine Gluconate Cloth  6 each Topical Q0600  . cloBAZam  60 mg Per Tube BID  . levothyroxine  25 mcg Per Tube Q0600  . LORazepam  1 mg Intravenous Q2H  . mouth rinse  15 mL Mouth Rinse q12n4p  . nystatin   Topical Daily  . sodium chloride flush  10-40 mL Intracatheter Q12H   Continuous Infusions: . levETIRAcetam 1,000 mg (01/19/19 1107)  . morphine 1 mg/hr (01/16/19 1148)     LOS: 42 days        Hosie Poisson, MD [facility] 01/19/2019, 3:47 PM

## 2019-01-19 NOTE — Progress Notes (Signed)
Extensive conversation with patient's mother, Altha Harm, this afternoon. RN attempted to answer questions and further explain "comfort care" measures as well as withdrawal of feed and IV hydration. Patient's mother seemed more at peace after conversation. In her words she worries that she is "killing" him by withdrawing these measures. May consider follow up with palliative and/or chaplain. Patient's fever still high today, but came down from last night. Temperature highest at 101.8. Tylenol and Ibuprofen alternated. Dr. Karleen Hampshire approved use of cooling blanket as needed for fever for use of comfort for patient.

## 2019-01-20 NOTE — Progress Notes (Signed)
PROGRESS NOTE    Aaron Vega  JJO:[id number] DOB: [birth year] DOA: 12/23/2018 PCP: [facility]    Brief Narrative:  23 year old autistic male and prior history of epilepsy follows at [facility] neurology, nonverbal at baseline with marked cognitive decline and gait dysfunction with severe obstipation initially presented to the hospital in November with severe obstipation lethargy and vomiting later on he was admitted this time with aspiration pneumonia, metabolic encephalopathy and ileus patient's hospital course was complicated by persistent lactic acidosis, Wernicke's encephalopathy in the setting thing of severe thiamine deficiency confirmed on MRI on 11/16, adynamic ileus and hepatic steatosis evident on CT abdomen and pelvis on 12/22/2018, GBS treated with IVIG, Intubation for respiratory failure in the setting of sepsis, and aspiration pneumonia.   patient's mom does not want to move the patient out of 4 N. at this time.  Discussed with the patient's mom at bedside that he is not responding to IV antibiotics and hence  discontinue the antibiotics .  Patient's mom decided for a comfort approach and and agreed that her main focus is her son's comfort and dignity.  Patient has been on morphine 2 mg every 2 hours scheduled and appears comfortable . Transitioned to morphine drip after discussing with the mother and palliative care.  Palliative care to make adjustments to the morphine drip as per patient's requirement.   Assessment & Plan:   Principal Problem:   Severe sepsis ([place]) Active Problems:   Autistic disorder   Intractable nausea and vomiting   Seizure disorder (HCC)   Constipation in male   Obesity, Class III, BMI 40-49.9 (morbid obesity) ([place])   Ileus (HCC)   Aspiration pneumonia (HCC)   Community acquired pneumonia of left lower lobe of lung   Goals of care, counseling/discussion   Palliative care by specialist   DNR (do not resuscitate) discussion  Protein-calorie malnutrition, severe   Weakness of lower extremity   Aspiration into airway   Pressure injury of skin   Fever   Diarrhea   Wernicke's encephalopathy   GBS (Guillain Barre syndrome) ([place])   Dysphagia   Do not intubate but use all other measures   Leukocytosis   Sinus tachycardia   Hypotension   Pain   Acute on chronic respiratory failure with hypoxia (HCC)   Dyspnea   Tachypnea   Increased oropharyngeal secretions  Acute on chronic respiratory failure  Probably secondary to recurrent aspiration pneumonia. Patient completed a course of antibiotics earlier this admission. It appears that patient continues to have aspirate, . He is currently on 4 lit of [place] oxygen.  Patient appears to be comfortable no new events overnight.   Sinus tachycardia probably secondary to increased work of breathing. His heart rate still in 130s but he does not appear to be symptomatic or in distress.   Nutrition  s/p PEG by IR on 12/8  Transition to comfort measures and tube feeds have been stopped.   Hypotension Appears to have resolved  History of Guillain Barr syndrome Treated with IVIG  History of chronic epilepsy Continue with Keppra and Onfi  Adynamic ileus  Appears to have resolved Patient has a rectal tube, no bowel movements no stool in the bag.  Bowel sounds are heard. As per RN, some brown discharge leaking from the flexiseal.   Urinary retention:  Foley catheter was later placed yesterday.   Hypothyroidism:  Continue with Synthroid  Recent Warnicke encephalopathy Treated with thiamine.    Unfortunately patient continues to be tachypneic, tachycardic with bilateral  rhonchi and respiratory distress requiring up to 3 L of nasal cannula oxygen with underlying multiple comorbidities, without any clinical progression to medications or antibiotics, poor functional status, clinical deterioration. Palliative care consulted. Patient's mom transitioned him to comfort  measures.he is currently on morphine gtt and appears comfortable.  Appreciate palliative care input and support.  No change in his status at this time.  01/20/2019. Discussed with mother as well as father.  They had multiple discussions about reintroduction of tube feeding, resuming antibiotics as well as giving another dose of IVIG. Explained the rationale of using IVIG and the mechanics of that and GBS and explained that this situation is not going to improve after using IVIG at this point. Also explained that the patient remains at risk for aspiration and remains at risk for recurrent pneumonias and resuming tube feeding or antibiotics would not change that outcome. They would talk about and decide further course of action.  Pressure injury on admission  Pressure Injury 12/20/18 Buttocks Right Deep Tissue Injury - Purple or maroon localized area of discolored intact skin or blood-filled blister due to damage of underlying soft tissue from pressure and/or shear. pink blanchable skin with small area in mid (Active)  12/20/18 1200  Location: Buttocks  Location Orientation: Right  Staging: Deep Tissue Injury - Purple or maroon localized area of discolored intact skin or blood-filled blister due to damage of underlying soft tissue from pressure and/or shear.  Wound Description (Comments): pink blanchable skin with small area in middle that is purple and nonblanchable  Present on Admission: Yes    . Severe protein calorie malnutrition:  Not taking anything by mouth     DVT prophylaxis: SCD'S Code Status: DNR Family Communication: mom at bedside.  Disposition Plan: Poor prognosis on comfort measures  Consultants:  Palliative care    Procedures: None   Antimicrobials: Zosyn restarted on 01/13/2019 discontinued on 12/22   Subjective: Appears somewhat comfortable. Heart rate still elevated on telemetry. Moving both upper extremities but no purposeful movement of the lower  extremities.  Objective: Vitals:   01/19/19 1600 01/20/19 0500 01/20/19 0615 01/20/19 0800  BP:   (!) 120/93 118/90  Pulse:   88 (!) 118  Resp:   (!) 24 19  Temp: (!) 101.8 F (38.8 C)  99.5 F (37.5 C) 99.9 F (37.7 C)  TempSrc: Axillary  Axillary Axillary  SpO2:   100% 100%  Weight:  129.2 kg    Height:        Intake/Output Summary (Last 24 hours) at 01/20/2019 1842 Last data filed at 01/20/2019 1610 Gross per 24 hour  Intake 100 ml  Output 350 ml  Net -250 ml   Filed Weights   01/18/19 0500 01/19/19 0500 01/20/19 0500  Weight: 131.6 kg 129.8 kg 129.2 kg    Examination:  General exam: alert and comfortable , not in distress.  Respiratory system: diminished at bases, no wheezing  Cardiovascular system: S1-S2 heard, tachycardic. Gastrointestinal system:soft, non tender bowel sounds heard.   Central nervous system: pt is alert and comfortable, non verbal.  Extremities: trace pedal edema, edema of the upper extremities.  Skin: Deep tissue pressure injury on the right buttock present on admission. Psychiatry: pt is autistic.     Data Reviewed: I have personally reviewed following labs and imaging studies  CBC: No results for input(s): WBC, NEUTROABS, HGB, HCT, MCV, PLT in the last 168 hours. Basic Metabolic Panel: No results for input(s): NA, K, CL, CO2, GLUCOSE, BUN, CREATININE, CALCIUM,  MG, PHOS in the last 168 hours. GFR: Estimated Creatinine Clearance: 200.2 mL/min (A) (by C-G formula based on SCr of 0.6 mg/dL (L)). Liver Function Tests: No results for input(s): AST, ALT, ALKPHOS, BILITOT, PROT, ALBUMIN in the last 168 hours. No results for input(s): LIPASE, AMYLASE in the last 168 hours. No results for input(s): AMMONIA in the last 168 hours. Coagulation Profile: No results for input(s): INR, PROTIME in the last 168 hours. Cardiac Enzymes: No results for input(s): CKTOTAL, CKMB, CKMBINDEX, TROPONINI in the last 168 hours. BNP (last 3 results) No results  for input(s): PROBNP in the last 8760 hours. HbA1C: No results for input(s): HGBA1C in the last 72 hours. CBG: No results for input(s): GLUCAP in the last 168 hours. Lipid Profile: No results for input(s): CHOL, HDL, LDLCALC, TRIG, CHOLHDL, LDLDIRECT in the last 72 hours. Thyroid Function Tests: No results for input(s): TSH, T4TOTAL, FREET4, T3FREE, THYROIDAB in the last 72 hours. Anemia Panel: No results for input(s): VITAMINB12, FOLATE, FERRITIN, TIBC, IRON, RETICCTPCT in the last 72 hours. Sepsis Labs: No results for input(s): PROCALCITON, LATICACIDVEN in the last 168 hours.  No results found for this or any previous visit (from the past 240 hour(s)).       Radiology Studies: No results found.      Scheduled Meds: . chlorhexidine gluconate (MEDLINE KIT)  15 mL Mouth Rinse BID  . Chlorhexidine Gluconate Cloth  6 each Topical Q0600  . cloBAZam  60 mg Per Tube BID  . levothyroxine  25 mcg Per Tube Q0600  . LORazepam  1 mg Intravenous Q2H  . mouth rinse  15 mL Mouth Rinse q12n4p  . nystatin   Topical Daily  . sodium chloride flush  10-40 mL Intracatheter Q12H   Continuous Infusions: . levETIRAcetam 1,000 mg (01/20/19 0928)  . morphine 1 mg/hr (01/19/19 2221)     LOS: 72 days        Berle Mull, MD [facility] 01/20/2019, 6:42 PM

## 2019-01-21 LAB — CBC WITH DIFFERENTIAL/PLATELET
Abs Immature Granulocytes: 0.01 10*3/uL (ref 0.00–0.07)
Basophils Absolute: 0 10*3/uL (ref 0.0–0.1)
Basophils Relative: 1 %
Eosinophils Absolute: 0.3 10*3/uL (ref 0.0–0.5)
Eosinophils Relative: 4 %
HCT: 31.8 % — ABNORMAL LOW (ref 39.0–52.0)
Hemoglobin: 9.6 g/dL — ABNORMAL LOW (ref 13.0–17.0)
Immature Granulocytes: 0 %
Lymphocytes Relative: 25 %
Lymphs Abs: 1.8 10*3/uL (ref 0.7–4.0)
MCH: 28 pg (ref 26.0–34.0)
MCHC: 30.2 g/dL (ref 30.0–36.0)
MCV: 92.7 fL (ref 80.0–100.0)
Monocytes Absolute: 0.6 10*3/uL (ref 0.1–1.0)
Monocytes Relative: 8 %
Neutro Abs: 4.6 10*3/uL (ref 1.7–7.7)
Neutrophils Relative %: 62 %
Platelets: 296 10*3/uL (ref 150–400)
RBC: 3.43 MIL/uL — ABNORMAL LOW (ref 4.22–5.81)
RDW: 15.9 % — ABNORMAL HIGH (ref 11.5–15.5)
WBC: 7.2 10*3/uL (ref 4.0–10.5)
nRBC: 0 % (ref 0.0–0.2)

## 2019-01-21 LAB — URINALYSIS, ROUTINE W REFLEX MICROSCOPIC
Glucose, UA: NEGATIVE mg/dL
Ketones, ur: 20 mg/dL — AB
Nitrite: POSITIVE — AB
Protein, ur: >=300 mg/dL — AB
Specific Gravity, Urine: 1.029 (ref 1.005–1.030)
WBC, UA: >50 WBC/hpf — ABNORMAL HIGH (ref 0–5)
pH: 5 (ref 5.0–8.0)

## 2019-01-21 LAB — COMPREHENSIVE METABOLIC PANEL
ALT: 29 U/L (ref 0–44)
AST: 27 U/L (ref 15–41)
Albumin: 2.8 g/dL — ABNORMAL LOW (ref 3.5–5.0)
Alkaline Phosphatase: 44 U/L (ref 38–126)
Anion gap: 13 (ref 5–15)
BUN: 7 mg/dL (ref 6–20)
CO2: 29 mmol/L (ref 22–32)
Calcium: 9.2 mg/dL (ref 8.9–10.3)
Chloride: 101 mmol/L (ref 98–111)
Creatinine, Ser: <0.3 mg/dL — ABNORMAL LOW (ref 0.61–1.24)
Glucose, Bld: 93 mg/dL (ref 70–99)
Potassium: 3.2 mmol/L — ABNORMAL LOW (ref 3.5–5.1)
Sodium: 143 mmol/L (ref 135–145)
Total Bilirubin: 0.7 mg/dL (ref 0.3–1.2)
Total Protein: 6.4 g/dL — ABNORMAL LOW (ref 6.5–8.1)

## 2019-01-21 LAB — AMMONIA: Ammonia: 17 umol/L (ref 9–35)

## 2019-01-21 LAB — LACTIC ACID, PLASMA: Lactic Acid, Venous: 0.7 mmol/L (ref 0.5–1.9)

## 2019-01-21 LAB — PROCALCITONIN: Procalcitonin: 0.1 ng/mL

## 2019-01-21 MED ORDER — LORAZEPAM 2 MG/ML IJ SOLN
1.0000 mg | INTRAMUSCULAR | Status: DC
  Administered 2019-01-21 – 2019-01-26 (×60): 1 mg via INTRAVENOUS
  Filled 2019-01-21 (×54): qty 1

## 2019-01-21 NOTE — Progress Notes (Signed)
PROGRESS NOTE    Aaron Vega  RWE:[id number] DOB: [birth year] DOA: 12/10/2018 PCP: [facility]    Brief Narrative:  23 year old autistic male and prior history of epilepsy follows at [facility] neurology, nonverbal at baseline with marked cognitive decline and gait dysfunction with severe obstipation initially presented to the hospital in November with severe obstipation lethargy and vomiting later on he was admitted this time with aspiration pneumonia, metabolic encephalopathy and ileus patient's hospital course was complicated by persistent lactic acidosis, Wernicke's encephalopathy in the setting thing of severe thiamine deficiency confirmed on MRI on 11/16, adynamic ileus and hepatic steatosis evident on CT abdomen and pelvis on 12/22/2018, GBS treated with IVIG, Intubation for respiratory failure in the setting of sepsis, and aspiration pneumonia.   patient's mom does not want to move the patient out of [address] at this time.  Discussed with the patient's mom at bedside that he is not responding to IV antibiotics and hence  discontinue the antibiotics .  Patient's mom decided for a comfort approach and and agreed that her main focus is her son's comfort and dignity.  Patient has been on morphine 2 mg every 2 hours scheduled and appears comfortable . Transitioned to morphine drip after discussing with the mother and palliative care.  Palliative care to make adjustments to the morphine drip as per patient's requirement.    Patient seen and examined today. Discussed with mother who would like to get a set of blood work and urine analysis to see how he is doing and then make a decision whether to continue comfort measures versus do aggressive management. As per RN patient has been febrile overnight but no new changes at this time.  Assessment & Plan:   Principal Problem:   Severe sepsis ([place]) Active Problems:   Autistic disorder   Intractable nausea and vomiting  Seizure disorder (HCC)   Constipation in male   Obesity, Class III, BMI 40-49.9 (morbid obesity) ([place])   Ileus (HCC)   Aspiration pneumonia (HCC)   Community acquired pneumonia of left lower lobe of lung   Goals of care, counseling/discussion   Palliative care by specialist   DNR (do not resuscitate) discussion   Protein-calorie malnutrition, severe   Weakness of lower extremity   Aspiration into airway   Pressure injury of skin   Fever   Diarrhea   Wernicke's encephalopathy   GBS (Guillain Barre syndrome) ([place])   Dysphagia   Do not intubate but use all other measures   Leukocytosis   Sinus tachycardia   Hypotension   Pain   Acute on chronic respiratory failure with hypoxia (HCC)   Dyspnea   Tachypnea   Increased oropharyngeal secretions  Acute on chronic respiratory failure  Probably secondary to recurrent aspiration pneumonia. Patient completed a course of antibiotics earlier this admission. It appears that patient continues to have aspirate, . Patient is stable on 4 L of nasal cannula oxygen plan to wean him off the oxygen if possible.  No new complaints at this time.    Sinus tachycardia probably secondary to increased work of breathing. Patient continues to be tachycardic in 120s.  No new complaints at this time.   Nutrition  s/p PEG by IR on 12/8  Patient was transitioned to comfort measures by his mother and tube feeds have been stopped.  On further discussing today patient's mother is adamant that that he has aspirated 3 times on tube feeds and does not want him to go back on the vent.  Hypotension Resolved.    History of Guillain Barr syndrome Treated with IVIG.    History of chronic epilepsy Continue with keppra and onfi.   Adynamic ileus  Appears to have resolved Patient has a rectal tube, no bowel movements no stool in the bag.  Bowel sounds are heard. As per RN, some brown discharge leaking from the flexiseal.   Urinary retention:  Foley  catheter was replaced.    Hypothyroidism:  Continue with Synthroid  Recent Warnicke encephalopathy Treated with thiamine.    Unfortunately patient continues to be tachypneic, tachycardic with bilateral rhonchi and respiratory distress requiring up to 4 L of nasal cannula oxygen with underlying multiple comorbidities, without any clinical progression to medications or antibiotics, poor functional status, Palliative care consulted. Patient's mom transitioned him to comfort measures. Patient's mom is adamant that he cannot go back on the vent and knows that he aspirated on the tube feeds. He is currently on morphine gtt and appears comfortable.     Pressure injury on admission  Pressure Injury 12/20/18 Buttocks Right Deep Tissue Injury - Purple or maroon localized area of discolored intact skin or blood-filled blister due to damage of underlying soft tissue from pressure and/or shear. pink blanchable skin with small area in mid (Active)  12/20/18 1200  Location: Buttocks  Location Orientation: Right  Staging: Deep Tissue Injury - Purple or maroon localized area of discolored intact skin or blood-filled blister due to damage of underlying soft tissue from pressure and/or shear.  Wound Description (Comments): pink blanchable skin with small area in middle that is purple and nonblanchable  Present on Admission: Yes    . Severe protein calorie malnutrition:  Not taking anything by mouth, and tube feeds held due to transition to comfort care.      DVT prophylaxis: SCD'S Code Status: DNR Family Communication: mom at bedside.  Disposition Plan: Poor prognosis on comfort measures  Consultants:  Palliative care    Procedures: None   Antimicrobials: Zosyn restarted on 01/13/2019 discontinued on 12/22   Subjective: As per RN he has been febrile overnight.  No new events.  Objective: Vitals:   01/21/19 0330 01/21/19 0500 01/21/19 0748 01/21/19 1238  BP:   123/66 127/74    Pulse:   (!) 123 (!) 106  Resp: '18  19 17  ' Temp: (!) 102 F (38.9 C)  (!) 100.8 F (38.2 C) 98.7 F (37.1 C)  TempSrc: Axillary  Oral Oral  SpO2:   100% 100%  Weight:  128.5 kg    Height:        Intake/Output Summary (Last 24 hours) at 01/21/2019 1240 Last data filed at 01/21/2019 0645 Gross per 24 hour  Intake 216 ml  Output 775 ml  Net -559 ml   Filed Weights   01/19/19 0500 01/20/19 0500 01/21/19 0500  Weight: 129.8 kg 129.2 kg 128.5 kg    Examination:  General exam: alert and comfortable. On 4 lit of [place] oxygen.  Respiratory system: Diminished at bases, no wheezing or rhonchi Cardiovascular system: S1-S2 heard, tachycardic. Gastrointestinal system: Abdomen is soft, nontender, bowel sounds heard  Central nervous system: She is alert appears comfortable but nonverbal Extremities: Deep tissue pressure injury on the right buttock present on admission sychiatry: pt is autistic.     Data Reviewed: I have personally reviewed following labs and imaging studies  CBC: Recent Labs  Lab 01/21/19 1153  WBC 7.2  NEUTROABS 4.6  HGB 9.6*  HCT 31.8*  MCV 92.7  PLT 296  Basic Metabolic Panel: No results for input(s): NA, K, CL, CO2, GLUCOSE, BUN, CREATININE, CALCIUM, MG, PHOS in the last 168 hours. GFR: Estimated Creatinine Clearance: 199.6 mL/min (A) (by C-G formula based on SCr of 0.6 mg/dL (L)). Liver Function Tests: No results for input(s): AST, ALT, ALKPHOS, BILITOT, PROT, ALBUMIN in the last 168 hours. No results for input(s): LIPASE, AMYLASE in the last 168 hours. Recent Labs  Lab 01/21/19 1153  AMMONIA 17   Coagulation Profile: No results for input(s): INR, PROTIME in the last 168 hours. Cardiac Enzymes: No results for input(s): CKTOTAL, CKMB, CKMBINDEX, TROPONINI in the last 168 hours. BNP (last 3 results) No results for input(s): PROBNP in the last 8760 hours. HbA1C: No results for input(s): HGBA1C in the last 72 hours. CBG: No results for input(s):  GLUCAP in the last 168 hours. Lipid Profile: No results for input(s): CHOL, HDL, LDLCALC, TRIG, CHOLHDL, LDLDIRECT in the last 72 hours. Thyroid Function Tests: No results for input(s): TSH, T4TOTAL, FREET4, T3FREE, THYROIDAB in the last 72 hours. Anemia Panel: No results for input(s): VITAMINB12, FOLATE, FERRITIN, TIBC, IRON, RETICCTPCT in the last 72 hours. Sepsis Labs: Recent Labs  Lab 01/21/19 1153  LATICACIDVEN 0.7    No results found for this or any previous visit (from the past 240 hour(s)).       Radiology Studies: No results found.      Scheduled Meds: . chlorhexidine gluconate (MEDLINE KIT)  15 mL Mouth Rinse BID  . Chlorhexidine Gluconate Cloth  6 each Topical Q0600  . cloBAZam  60 mg Per Tube BID  . levothyroxine  25 mcg Per Tube Q0600  . LORazepam  1 mg Intravenous Q2H  . mouth rinse  15 mL Mouth Rinse q12n4p  . nystatin   Topical Daily  . sodium chloride flush  10-40 mL Intracatheter Q12H   Continuous Infusions: . levETIRAcetam 1,000 mg (01/21/19 1018)  . morphine 1 mg/hr (01/20/19 1100)     LOS: 17 days        Hosie Poisson, MD [facility] 01/21/2019, 12:40 PM

## 2019-01-22 NOTE — Progress Notes (Signed)
Patient ID: Aaron Vega, male   DOB: April 18, 1995, 23 y.o.   MRN: [id number]  Nurse practitioner visited at bedside specifically to offer emotional support to the patient's  mother/[name] Every.  [name] has been at her son's bedside for the last 45 days, prolonged hospitalization.  Palliative Medicine has been involved throughout the hospitalization offering emotional support and assistance in navigating complex medical decisions as mother allows.  Patient's mother/[name] has made all medical decisions in coordination with the attending physicians.   I came to the room this morning at 8 AM and patient's mother was not at the bedside.  I left a handwritten note, my card with contact information and my personal cell phone number inviting her to contact me questions and concerns, and for the opportunity for conversation regarding overall medical situation, or if she just needs to talk.  Current time is 4:25 PM and I have not received a return call.. I am available to support the family as they will allow and if they are interested in palliative medicine support and interaction. Wadie Lessen NP [phone]  No charge

## 2019-01-22 NOTE — Progress Notes (Addendum)
PROGRESS NOTE    Aaron Vega  PYP:[id number] DOB: Dec 31, 1995 DOA: 12/15/2018 PCP: [facility]    Brief Narrative:  Patient is a 23 year old Caucasian male, obese, with prior documented medical history significant for Tourette's syndrome sleep apnea, seizure, prediabetes, pneumonia, pancreatitis, hypertension, fatty liver and autism.  Patient follows at [facility] neurology.  Patient is said to be nonverbal at baseline with marked cognitive decline, gait dysfunction and severe obstipation.  Patient initially presented to the hospital on 11/20/2018 with severe obstipation, lethargy and vomiting.  Patient was admitted this time (12/09/2018) with aspiration pneumonia, metabolic encephalopathy and ileus.  Patient's hospital course has been complicated by persistent lactic acidosis, Wernicke's encephalopathy in the setting of severe thiamine deficiency confirmed on MRI on 11/16, adynamic ileus and hepatic steatosis evident on CT abdomen and pelvis on 12/22/2018, GBS treated with IVIG, Intubation for respiratory failure in the setting of sepsis, and aspiration pneumonia.  Patient's mother does not want to move the patient out of [place] this time.  As per prior documentation, patient was said not to be repeat responding to IV antibiotics, hence, antibiotics was discontinued.  Prior documentation indicated that the goal of care was comfort directed approach - "Patient's mom decided for a comfort approach and and agreed that her main focus is her son's comfort and dignity.  Patient has been on morphine 2 mg every 2 hours scheduled and appears comfortable . Transitioned to morphine drip after discussing with the mother and palliative care.  Palliative care to make adjustments to the morphine drip as per patient's requirement".  01/22/2019: Patient seen alongside patient's mother and patient's nurse that was present briefly.  From my interaction with the patient's mom, the mother seems to  suggest that she was uncertain about goal of care.  Will defer defining the goal of care all over to the palliative care team.  I have also encouraged patient's mother to discuss with palliative care team.  Reviewed lab work and urinalysis ordered by Dr. Karleen Hampshire with the patient's mother.  Assessment & Plan:   Principal Problem:   Severe sepsis ([place]) Active Problems:   Autistic disorder   Intractable nausea and vomiting   Seizure disorder (HCC)   Constipation in male   Obesity, Class III, BMI 40-49.9 (morbid obesity) ([place])   Ileus (HCC)   Aspiration pneumonia (HCC)   Community acquired pneumonia of left lower lobe of lung   Goals of care, counseling/discussion   Palliative care by specialist   DNR (do not resuscitate) discussion   Protein-calorie malnutrition, severe   Weakness of lower extremity   Aspiration into airway   Pressure injury of skin   Fever   Diarrhea   Wernicke's encephalopathy   GBS (Guillain Barre syndrome) ([place])   Dysphagia   Do not intubate but use all other measures   Leukocytosis   Sinus tachycardia   Hypotension   Pain   Acute on chronic respiratory failure with hypoxia (HCC)   Dyspnea   Tachypnea   Increased oropharyngeal secretions  Acute on chronic respiratory failure: Probably secondary to recurrent aspiration pneumonia. Patient completed a course of antibiotics earlier this admission. It appears that patient continues to have aspirate, . Patient is stable on 4 L of nasal cannula oxygen plan to wean him off the oxygen if possible.  No new complaints at this time. 01/22/2019: No new changes.  Patient is comfortable.  Sinus tachycardia: -Probably multifactorial.  -Patient is stable.   -Patient is also comfortable.  Nutrition:  s/p  PEG by IR on 12/8  As per prior documentations-"patient was transitioned to comfort measures by his mother and tube feeds have been stopped.  On further discussing today patient's mother is adamant that that he has  aspirated 3 times on tube feeds and does not want him to go back on the vent".  Hypotension: Resolved.   History of Guillain Josefa Half' syndrome: Treated with IVIG.   History of chronic epilepsy: Continue with keppra and onfi.  1229: Further care will depend on goal of care  Adynamic ileus: Appears to have resolved Patient has a rectal tube, no bowel movements no stool in the bag.  Bowel sounds are heard. As per RN, some brown discharge leaking from the flexiseal.  01/22/2019: Stable.  Urinary retention:  Foley catheter was replaced.  01/22/2019: No new changes  Hypothyroidism:  Continue with Synthroid  Recent Warnicke encephalopathy: Treated with thiamine.  Severe protein calorie malnutrition:  Not taking anything by mouth, and tube feeds held due to transition to comfort care.   DVT prophylaxis: SCD'S Code Status: DNR Family Communication: mom at bedside.  Disposition Plan: We will get palliative care team to discuss with patient's partner to ensure that the goal of care is clear to all.  Consultants:  Palliative care   Procedures: None   Antimicrobials: Zosyn restarted on 01/13/2019 discontinued on 12/22   Subjective: No history from patient  Objective: Vitals:   01/21/19 1927 01/22/19 0500 01/22/19 0730 01/22/19 1141  BP: 131/72  130/71 119/72  Pulse:   (!) 109 (!) 108  Resp:   20 17  Temp: 98.9 F (37.2 C)  98.7 F (37.1 C) 98.7 F (37.1 C)  TempSrc: Oral   Oral  SpO2:   100% 100%  Weight:  128.6 kg    Height:        Intake/Output Summary (Last 24 hours) at 01/22/2019 1457 Last data filed at 01/22/2019 0800 Gross per 24 hour  Intake 10 ml  Output 500 ml  Net -490 ml   Filed Weights   01/20/19 0500 01/21/19 0500 01/22/19 0500  Weight: 129.2 kg 128.5 kg 128.6 kg    Examination:  General exam: Resting quietly. Respiratory system: Clear anteriorly Cardiovascular system: S1-S2 heard, tachycardic. Gastrointestinal system: Abdomen is obese,  soft.  Her organs are difficult to assess.  Central nervous system: Patient opens his eyes intermittently, and moves the upper extremities.  Data Reviewed: I have personally reviewed following labs and imaging studies  CBC: Recent Labs  Lab 01/21/19 1153  WBC 7.2  NEUTROABS 4.6  HGB 9.6*  HCT 31.8*  MCV 92.7  PLT 035   Basic Metabolic Panel: Recent Labs  Lab 01/21/19 1153  NA 143  K 3.2*  CL 101  CO2 29  GLUCOSE 93  BUN 7  CREATININE <0.30*  CALCIUM 9.2   GFR: CrCl cannot be calculated (This lab value cannot be used to calculate CrCl because it is not a number: <0.30). Liver Function Tests: Recent Labs  Lab 01/21/19 1153  AST 27  ALT 29  ALKPHOS 44  BILITOT 0.7  PROT 6.4*  ALBUMIN 2.8*   No results for input(s): LIPASE, AMYLASE in the last 168 hours. Recent Labs  Lab 01/21/19 1153  AMMONIA 17   Coagulation Profile: No results for input(s): INR, PROTIME in the last 168 hours. Cardiac Enzymes: No results for input(s): CKTOTAL, CKMB, CKMBINDEX, TROPONINI in the last 168 hours. BNP (last 3 results) No results for input(s): PROBNP in the last 8760 hours. HbA1C:  No results for input(s): HGBA1C in the last 72 hours. CBG: No results for input(s): GLUCAP in the last 168 hours. Lipid Profile: No results for input(s): CHOL, HDL, LDLCALC, TRIG, CHOLHDL, LDLDIRECT in the last 72 hours. Thyroid Function Tests: No results for input(s): TSH, T4TOTAL, FREET4, T3FREE, THYROIDAB in the last 72 hours. Anemia Panel: No results for input(s): VITAMINB12, FOLATE, FERRITIN, TIBC, IRON, RETICCTPCT in the last 72 hours. Sepsis Labs: Recent Labs  Lab 01/21/19 1153  PROCALCITON 0.10  LATICACIDVEN 0.7    No results found for this or any previous visit (from the past 240 hour(s)).       Radiology Studies: No results found.      Scheduled Meds: . chlorhexidine gluconate (MEDLINE KIT)  15 mL Mouth Rinse BID  . Chlorhexidine Gluconate Cloth  6 each Topical Q0600    . cloBAZam  60 mg Per Tube BID  . levothyroxine  25 mcg Per Tube Q0600  . LORazepam  1 mg Intravenous Q2H  . mouth rinse  15 mL Mouth Rinse q12n4p  . nystatin   Topical Daily  . sodium chloride flush  10-40 mL Intracatheter Q12H   Continuous Infusions: . levETIRAcetam 1,000 mg (01/22/19 0937)  . morphine 1 mg/hr (01/20/19 1100)     LOS: 41 days    Bonnell Public, MD [facility] 01/22/2019, 2:57 PM

## 2019-01-23 NOTE — Progress Notes (Signed)
PROGRESS NOTE    Aaron Vega  TDV:[id number] DOB: September 06, 1995 DOA: 12/05/2018 PCP: [facility]    Brief Narrative:  Patient is a 23 year old Caucasian male, obese, with prior documented medical history significant for Tourette's syndrome sleep apnea, seizure, prediabetes, pneumonia, pancreatitis, hypertension, fatty liver and autism.  Patient follows at [facility] neurology.  Patient is said to be nonverbal at baseline with marked cognitive decline, gait dysfunction and severe obstipation.  Patient initially presented to the hospital on 11/20/2018 with severe obstipation, lethargy and vomiting.  Patient was admitted this time (12/09/2018) with aspiration pneumonia, metabolic encephalopathy and ileus.  Patient's hospital course has been complicated by persistent lactic acidosis, Wernicke's encephalopathy in the setting of severe thiamine deficiency confirmed on MRI on 11/16, adynamic ileus and hepatic steatosis evident on CT abdomen and pelvis on 12/22/2018, GBS treated with IVIG, Intubation for respiratory failure in the setting of sepsis, and aspiration pneumonia.  Patient's mother does not want to move the patient out of [place] this time.  As per prior documentation, patient was said not to be repeat responding to IV antibiotics, hence, antibiotics was discontinued.  Prior documentation indicated that the goal of care was comfort directed approach - "Patient's mom decided for a comfort approach and and agreed that her main focus is her son's comfort and dignity.  Patient has been on morphine 2 mg every 2 hours scheduled and appears comfortable . Transitioned to morphine drip after discussing with the mother and palliative care.  Palliative care to make adjustments to the morphine drip as per patient's requirement".  01/22/2019: Patient seen alongside patient's mother and patient's nurse that was present briefly.  From my interaction with the patient's mom, the mother seems to  suggest that she was uncertain about goal of care.  Will defer defining the goal of care all over to the palliative care team.  I have also encouraged patient's mother to discuss with palliative care team.  Reviewed lab work and urinalysis ordered by Dr. Karleen Hampshire with the patient's mother.  01/23/2019: Patient seen alongside patient's mother and nurse.  Discussed with the palliative care team extensively.  Palliative care team will meet with patient's mother tomorrow morning.  Goal of care will need to be revisited so I will be on the same page.  Assessment & Plan:   Principal Problem:   Severe sepsis ([place]) Active Problems:   Autistic disorder   Intractable nausea and vomiting   Seizure disorder (HCC)   Constipation in male   Obesity, Class III, BMI 40-49.9 (morbid obesity) ([place])   Ileus (HCC)   Aspiration pneumonia (HCC)   Community acquired pneumonia of left lower lobe of lung   Goals of care, counseling/discussion   Palliative care by specialist   DNR (do not resuscitate) discussion   Protein-calorie malnutrition, severe   Weakness of lower extremity   Aspiration into airway   Pressure injury of skin   Fever   Diarrhea   Wernicke's encephalopathy   GBS (Guillain Barre syndrome) ([place])   Dysphagia   Do not intubate but use all other measures   Leukocytosis   Sinus tachycardia   Hypotension   Pain   Acute on chronic respiratory failure with hypoxia (HCC)   Dyspnea   Tachypnea   Increased oropharyngeal secretions  Acute on chronic respiratory failure: Probably secondary to recurrent aspiration pneumonia. Patient completed a course of antibiotics earlier this admission. It appears that patient continues to have aspirate, . Patient is stable on 4 L of nasal  cannula oxygen plan to wean him off the oxygen if possible.  No new complaints at this time. 01/23/2019: No new changes.  Patient is comfortable.  Sinus tachycardia: -Probably multifactorial.  -Patient is stable.    -Patient is also comfortable.  Nutrition:  s/p PEG by IR on 12/8  As per prior documentations-"patient was transitioned to comfort measures by his mother and tube feeds have been stopped.  On further discussing today patient's mother is adamant that that he has aspirated 3 times on tube feeds and does not want him to go back on the vent".  Hypotension: Resolved.   History of Guillain Josefa Half' syndrome: Treated with IVIG.   History of chronic epilepsy: Continue with keppra and onfi.  1229: Further care will depend on goal of care  Adynamic ileus: Appears to have resolved Patient has a rectal tube, no bowel movements no stool in the bag.  Bowel sounds are heard. As per RN, some brown discharge leaking from the flexiseal.  01/22/2019: Stable.  Urinary retention:  Foley catheter was replaced.  01/22/2019: No new changes  Hypothyroidism:  Continue with Synthroid  Recent Warnicke encephalopathy: Treated with thiamine.  Severe protein calorie malnutrition:  Not taking anything by mouth, and tube feeds held due to transition to comfort care.   DVT prophylaxis: SCD'S Code Status: DNR Family Communication: mom at bedside.  Disposition Plan: We will get palliative care team to discuss with patient's partner to ensure that the goal of care is clear to all.  Consultants:  Palliative care   Procedures: None   Antimicrobials: Zosyn restarted on 01/13/2019 discontinued on 12/22   Subjective: No history from patient  Objective: Vitals:   01/22/19 1625 01/22/19 1932 01/23/19 0500 01/23/19 0750  BP: (!) 147/81 (!) 151/82  (!) 148/91  Pulse: (!) 116 (!) 113  (!) 114  Resp: (!) 22   12  Temp: 97.7 F (36.5 C) 99 F (37.2 C)  99.3 F (37.4 C)  TempSrc: Oral Oral  Axillary  SpO2:    100%  Weight:   128.6 kg   Height:        Intake/Output Summary (Last 24 hours) at 01/23/2019 1918 Last data filed at 01/23/2019 1845 Gross per 24 hour  Intake 10 ml  Output 700 ml  Net  -690 ml   Filed Weights   01/21/19 0500 01/22/19 0500 01/23/19 0500  Weight: 128.5 kg 128.6 kg 128.6 kg    Examination:  General exam: Resting quietly. Respiratory system: Clear anteriorly Cardiovascular system: S1-S2 heard, tachycardic. Gastrointestinal system: Abdomen is obese, soft.  Her organs are difficult to assess.  Central nervous system: Patient opens his eyes intermittently, and moves the upper extremities.  Data Reviewed: I have personally reviewed following labs and imaging studies  CBC: Recent Labs  Lab 01/21/19 1153  WBC 7.2  NEUTROABS 4.6  HGB 9.6*  HCT 31.8*  MCV 92.7  PLT 892   Basic Metabolic Panel: Recent Labs  Lab 01/21/19 1153  NA 143  K 3.2*  CL 101  CO2 29  GLUCOSE 93  BUN 7  CREATININE <0.30*  CALCIUM 9.2   GFR: CrCl cannot be calculated (This lab value cannot be used to calculate CrCl because it is not a number: <0.30). Liver Function Tests: Recent Labs  Lab 01/21/19 1153  AST 27  ALT 29  ALKPHOS 44  BILITOT 0.7  PROT 6.4*  ALBUMIN 2.8*   No results for input(s): LIPASE, AMYLASE in the last 168 hours. Recent Labs  Lab  01/21/19 1153  AMMONIA 17   Coagulation Profile: No results for input(s): INR, PROTIME in the last 168 hours. Cardiac Enzymes: No results for input(s): CKTOTAL, CKMB, CKMBINDEX, TROPONINI in the last 168 hours. BNP (last 3 results) No results for input(s): PROBNP in the last 8760 hours. HbA1C: No results for input(s): HGBA1C in the last 72 hours. CBG: No results for input(s): GLUCAP in the last 168 hours. Lipid Profile: No results for input(s): CHOL, HDL, LDLCALC, TRIG, CHOLHDL, LDLDIRECT in the last 72 hours. Thyroid Function Tests: No results for input(s): TSH, T4TOTAL, FREET4, T3FREE, THYROIDAB in the last 72 hours. Anemia Panel: No results for input(s): VITAMINB12, FOLATE, FERRITIN, TIBC, IRON, RETICCTPCT in the last 72 hours. Sepsis Labs: Recent Labs  Lab 01/21/19 1153  PROCALCITON 0.10   LATICACIDVEN 0.7    No results found for this or any previous visit (from the past 240 hour(s)).       Radiology Studies: No results found.      Scheduled Meds: . chlorhexidine gluconate (MEDLINE KIT)  15 mL Mouth Rinse BID  . Chlorhexidine Gluconate Cloth  6 each Topical Q0600  . cloBAZam  60 mg Per Tube BID  . levothyroxine  25 mcg Per Tube Q0600  . LORazepam  1 mg Intravenous Q2H  . mouth rinse  15 mL Mouth Rinse q12n4p  . nystatin   Topical Daily  . sodium chloride flush  10-40 mL Intracatheter Q12H   Continuous Infusions: . levETIRAcetam 1,000 mg (01/23/19 1106)  . morphine 1 mg/hr (01/22/19 2244)     LOS: 2 days    Bonnell Public, MD [facility] 01/23/2019, 7:18 PM

## 2019-01-23 NOTE — Plan of Care (Signed)
  Problem: Education: Goal: Knowledge of General Education information will improve Description: Including pain rating scale, medication(s)/side effects and non-pharmacologic comfort measures Outcome: Progressing   Problem: Health Behavior/Discharge Planning: Goal: Ability to manage health-related needs will improve Outcome: Progressing   Problem: Clinical Measurements: Goal: Ability to maintain clinical measurements within normal limits will improve Outcome: Progressing   Problem: Clinical Measurements: Goal: Will remain free from infection Outcome: Progressing   Problem: Clinical Measurements: Goal: Will remain free from infection Outcome: Progressing   Problem: Clinical Measurements: Goal: Diagnostic test results will improve Outcome: Progressing   Problem: Clinical Measurements: Goal: Respiratory complications will improve Outcome: Progressing   Problem: Clinical Measurements: Goal: Cardiovascular complication will be avoided Outcome: Progressing

## 2019-01-24 NOTE — Progress Notes (Addendum)
Patient ID: Shean Gerding, male   DOB: 1995/09/28, 23 y.o.   MRN: [id number]  This NP met this morning  with patient's mother for continued conversation regarding current medical situation and clarification of GOCs.  Mom was open to meeting with me after her conversation with Dr Marthenia Rolling yesterday.  Created space and opportunity for patient's mother to explore thoughts and feelings regarding her current medical situation.   This is excruciating painful for her, "I know he is dying".  The past few weeks have been confusing for her, "some tell me he has days to live and other say weeks or even I don't know"  She continue to struggle with knowing " have I done everything possible for my son?".  Exploring if she feels confident in the care provided here at [facility] she clearly states "yes I  believe that the doctors have done everything they could, the nurses have been amazing" .  She does not feel that [place] could have gotten better care anywhere else.  The struggle is "my mother heart".  We discussed that this struggle  will be an ongoing process and encouraged her to consider counseling at some point in future time,  discussed hospice grief counseling services.   [name] clearly verbalizes that her hope and focus for her son at this time is comfort.  Patient's mother definitively expresses her decision for a comfort approach  I outlined a comfort path, allowing for a natural death.  Focus of Care is Comfort and Dignity- allow a natural death  Plan of Care: -DNR/DNI/ no BiPap -no artifical feeding or hydration now or in the future -no further antibiotic use, no labs or any diagnostics  - no escalation of care- DO NOT increase nasal cannula oxygen, utilize PRNs  Symptom management, no need to check O2 sats      - continue Morphine gtt, with morphine bolus via infusion 1 mg every 30 minutes as needed for dyspnea or pain, if patient requires 2 boluses in 1 hour increase basal rate by 50%.  -Morphine drip may titrate for comfort to maximum of 10 mg/h   - Robinul 0.4 mg IV every 6 hours as needed for terminal secretions   - Ativan 1 mg IV every 2 hrs prn for agitation -prognosis is likely weeks  Mother is adamant regarding "moving her son anywhere"  Her concern is knowing how sensitive her son is to change.  It creates anxiety and stress for him, "actually increases his heart rate".  He knows everyone here, "this increases his comfort"   I shared with her that it is not typical for a patient to stay in the hospital if a life expectancy is likely weeks.  I educated her regarding a residential hospice like [facility].  She is not open to this option  currently but tells me "I will think about it and talk to you on Sunday about it"   Questions and concerns addressed to the best of my ability.    Discussed with Dr Marthenia Rolling and bedside RN/ Elmyra Ricks  Total time spent on the unit was 90  minutes  PMT will continue to support holistically.  Greater than 50% of the time was spent in counseling and coordination of care.  This NP will f/u with mother on Sunday morning for ongoing support.  Wadie Lessen NP  Palliative Medicine Team Team Phone # [phone] Pager [phone]

## 2019-01-24 NOTE — Progress Notes (Signed)
While doing peri care on Pt bright RN noticed bright red blood on area around flexiseal. RN removed the flexiseal, upon removal there was a small amount of semi-formed type 5 stool. RN did not reinsert the flexiseal based on stool characteristics. Flexiseal cleaned and at the bedside if necessary to reinsert. MD aware.

## 2019-01-24 NOTE — Progress Notes (Signed)
Palliative Medicine RN Note: Hard Choices book delivered to mother at bedside per request of PMT NP Rupert Stacks Ridwan Bondy, RN, BSN, [facility] Palliative Medicine Team 01/24/2019 1:53 PM Office [phone]

## 2019-01-24 NOTE — Progress Notes (Signed)
PROGRESS NOTE    Aaron Vega  GYI:[id number] DOB: [birth year] DOA: 11/29/2018 PCP: [facility]    Brief Narrative:  Patient is a 23 year old Caucasian male, obese, with prior documented medical history significant for Tourette's syndrome sleep apnea, seizure, prediabetes, pneumonia, pancreatitis, hypertension, fatty liver and autism.  Patient follows at [facility] neurology.  Patient is said to be nonverbal at baseline with marked cognitive decline, gait dysfunction and severe obstipation.  Patient initially presented to the hospital on 11/20/2018 with severe obstipation, lethargy and vomiting.  Patient was admitted this time (12/09/2018) with aspiration pneumonia, metabolic encephalopathy and ileus.  Patient's hospital course has been complicated by persistent lactic acidosis, Wernicke's encephalopathy in the setting of severe thiamine deficiency confirmed on MRI on 11/16, adynamic ileus and hepatic steatosis evident on CT abdomen and pelvis on 12/22/2018, GBS treated with IVIG, Intubation for respiratory failure in the setting of sepsis, and aspiration pneumonia.  Patient's mother does not want to move the patient out of [place] this time.  As per prior documentation, patient was said not to be repeat responding to IV antibiotics, hence, antibiotics was discontinued.  Prior documentation indicated that the goal of care was comfort directed approach - "Patient's mom decided for a comfort approach and and agreed that her main focus is her son's comfort and dignity.  Patient has been on morphine 2 mg every 2 hours scheduled and appears comfortable . Transitioned to morphine drip after discussing with the mother and palliative care.  Palliative care to make adjustments to the morphine drip as per patient's requirement".  01/22/2019: Patient seen alongside patient's mother and patient's nurse that was present briefly.  From my interaction with the patient's mom, the mother seems to  suggest that she was uncertain about goal of care.  Will defer defining the goal of care all over to the palliative care team.  I have also encouraged patient's mother to discuss with palliative care team.  Reviewed lab work and urinalysis ordered by Dr. Karleen Hampshire with the patient's mother.  01/23/2019: Patient seen alongside patient's mother and nurse.  Discussed with the palliative care team extensively.  Palliative care team will meet with patient's mother tomorrow morning.  Goal of care will need to be revisited so I will be on the same page.  01/24/2019: Patient seen alongside patient's nurse and mother.  Patient's mother met with palliative care team earlier today.  Goal of care remains comfort directed measures.  Once more, and communicated to the patient's mother that goals of care will be based discussed with palliative care team only.  No new changes noted  Assessment & Plan:   Principal Problem:   Severe sepsis ([place]) Active Problems:   Autistic disorder   Intractable nausea and vomiting   Seizure disorder (HCC)   Constipation in male   Obesity, Class III, BMI 40-49.9 (morbid obesity) ([place])   Ileus (HCC)   Aspiration pneumonia ([place])   Community acquired pneumonia of left lower lobe of lung   Goals of care, counseling/discussion   Palliative care by specialist   DNR (do not resuscitate) discussion   Protein-calorie malnutrition, severe   Weakness of lower extremity   Aspiration into airway   Pressure injury of skin   Fever   Diarrhea   Wernicke's encephalopathy   GBS (Guillain Barre syndrome) ([place])   Dysphagia   Do not intubate but use all other measures   Leukocytosis   Sinus tachycardia   Hypotension   Pain   Acute on chronic  respiratory failure with hypoxia (HCC)   Dyspnea   Tachypnea   Increased oropharyngeal secretions  Acute on chronic respiratory failure: Probably secondary to recurrent aspiration pneumonia. Patient completed a course of antibiotics earlier this  admission. It appears that patient continues to have aspirate, . Patient is stable on 4 L of nasal cannula oxygen plan to wean him off the oxygen if possible.  No new complaints at this time. 01/24/2019: No new changes.  Patient is comfortable.  Sinus tachycardia: -Probably multifactorial.  -Patient is stable.   -Patient is also comfortable.  Nutrition:  s/p PEG by IR on 12/8  As per prior documentations-"patient was transitioned to comfort measures by his mother and tube feeds have been stopped.  On further discussing today patient's mother is adamant that that he has aspirated 3 times on tube feeds and does not want him to go back on the vent".  Hypotension: Resolved.   History of Guillain Josefa Half' syndrome: Treated with IVIG.   History of chronic epilepsy: Continue with keppra and onfi.  1229: Further care will depend on goal of care  Adynamic ileus: Appears to have resolved Patient has a rectal tube, no bowel movements no stool in the bag.  Bowel sounds are heard. As per RN, some brown discharge leaking from the flexiseal.  01/22/2019: Stable.  Urinary retention:  Foley catheter was replaced.  01/22/2019: No new changes  Hypothyroidism:  Continue with Synthroid  Recent Warnicke encephalopathy: Treated with thiamine.  Severe protein calorie malnutrition:  Not taking anything by mouth, and tube feeds held due to transition to comfort care.   DVT prophylaxis: SCD'S Code Status: DNR Family Communication: mom at bedside.  Disposition Plan: We will get palliative care team to discuss with patient's partner to ensure that the goal of care is clear to all.  Consultants:  Palliative care   Procedures: None   Antimicrobials: Zosyn restarted on 01/13/2019 discontinued on 12/22   Subjective: No history from patient  Objective: Vitals:   01/23/19 0750 01/23/19 1949 01/24/19 0500 01/24/19 1133  BP: (!) 148/91 133/68  124/86  Pulse: (!) 114 (!) 118  (!) 114  Resp:  12   19  Temp: 99.3 F (37.4 C) 99.6 F (37.6 C)  98.8 F (37.1 C)  TempSrc: Axillary Oral  Axillary  SpO2: 100%   99%  Weight:   129.7 kg   Height:        Intake/Output Summary (Last 24 hours) at 01/24/2019 1700 Last data filed at 01/23/2019 1845 Gross per 24 hour  Intake --  Output 350 ml  Net -350 ml   Filed Weights   01/22/19 0500 01/23/19 0500 01/24/19 0500  Weight: 128.6 kg 128.6 kg 129.7 kg    Examination:  General exam: Resting quietly. Respiratory system: Clear anteriorly Cardiovascular system: S1-S2 heard, tachycardic. Gastrointestinal system: Abdomen is obese, soft.  Her organs are difficult to assess.  Central nervous system: Patient opens his eyes intermittently, and moves the upper extremities.  Data Reviewed: I have personally reviewed following labs and imaging studies  CBC: Recent Labs  Lab 01/21/19 1153  WBC 7.2  NEUTROABS 4.6  HGB 9.6*  HCT 31.8*  MCV 92.7  PLT 350   Basic Metabolic Panel: Recent Labs  Lab 01/21/19 1153  NA 143  K 3.2*  CL 101  CO2 29  GLUCOSE 93  BUN 7  CREATININE <0.30*  CALCIUM 9.2   GFR: CrCl cannot be calculated (This lab value cannot be used to calculate CrCl because  it is not a number: <0.30). Liver Function Tests: Recent Labs  Lab 01/21/19 1153  AST 27  ALT 29  ALKPHOS 44  BILITOT 0.7  PROT 6.4*  ALBUMIN 2.8*   No results for input(s): LIPASE, AMYLASE in the last 168 hours. Recent Labs  Lab 01/21/19 1153  AMMONIA 17   Coagulation Profile: No results for input(s): INR, PROTIME in the last 168 hours. Cardiac Enzymes: No results for input(s): CKTOTAL, CKMB, CKMBINDEX, TROPONINI in the last 168 hours. BNP (last 3 results) No results for input(s): PROBNP in the last 8760 hours. HbA1C: No results for input(s): HGBA1C in the last 72 hours. CBG: No results for input(s): GLUCAP in the last 168 hours. Lipid Profile: No results for input(s): CHOL, HDL, LDLCALC, TRIG, CHOLHDL, LDLDIRECT in the last  72 hours. Thyroid Function Tests: No results for input(s): TSH, T4TOTAL, FREET4, T3FREE, THYROIDAB in the last 72 hours. Anemia Panel: No results for input(s): VITAMINB12, FOLATE, FERRITIN, TIBC, IRON, RETICCTPCT in the last 72 hours. Sepsis Labs: Recent Labs  Lab 01/21/19 1153  PROCALCITON 0.10  LATICACIDVEN 0.7    No results found for this or any previous visit (from the past 240 hour(s)).       Radiology Studies: No results found.      Scheduled Meds: . chlorhexidine gluconate (MEDLINE KIT)  15 mL Mouth Rinse BID  . Chlorhexidine Gluconate Cloth  6 each Topical Q0600  . cloBAZam  60 mg Per Tube BID  . LORazepam  1 mg Intravenous Q2H  . mouth rinse  15 mL Mouth Rinse q12n4p  . nystatin   Topical Daily  . sodium chloride flush  10-40 mL Intracatheter Q12H   Continuous Infusions: . levETIRAcetam 1,000 mg (01/24/19 0949)  . morphine 1 mg/hr (01/22/19 2244)     LOS: 26 days    Bonnell Public, MD [facility] 01/24/2019, 5:00 PM

## 2019-01-24 NOTE — Progress Notes (Signed)
RN had conversation with patient's mother, Aaron Vega, regarding her son's prognosis and the challenges she is facing as a mother to make the best decision for her son as his 23. Near the end of the conversation, she asked RN if the patient were transferred to palliative care if he would still get his antiepileptic meds. RN educated her that the only difference between the care he is receiving on 4NP as opposed to a palliative unit is that the palliative team is specialized in comfort care. RN concerned for knowledge deficit in patient's mother regarding palliative care.

## 2019-01-25 LAB — GLUCOSE, CAPILLARY: Glucose-Capillary: 81 mg/dL (ref 70–99)

## 2019-01-25 NOTE — Progress Notes (Signed)
PROGRESS NOTE    Aaron Vega  SEG:[id number] DOB: [birth year] DOA: 12/12/2018 PCP: [facility]   Brief Narrative:  Patient is a 24 year old Caucasian male, obese, with prior documented medical history significant for Tourette's syndrome sleep apnea, seizure, prediabetes, pneumonia, pancreatitis, hypertension, fatty liver and autism.  Patient follows at [facility] neurology.  Patient is said to be nonverbal at baseline with marked cognitive decline, gait dysfunction and severe obstipation.  Patient initially presented to the hospital on 11/20/2018 with severe obstipation, lethargy and vomiting.  Patient was admitted this time (12/09/2018) with aspiration pneumonia, metabolic encephalopathy and ileus.  Patient's hospital course has been complicated by persistent lactic acidosis, Wernicke's encephalopathy in the setting of severe thiamine deficiency confirmed on MRI on 11/16, adynamic ileus and hepatic steatosis evident on CT abdomen and pelvis on 12/22/2018, GBS treated with IVIG, Intubation for respiratory failure in the setting of sepsis, and aspiration pneumonia.  Patient's mother does not want to move the patient out of [place] this time.  As per prior documentation, patient was said not to be repeat responding to IV antibiotics, hence, antibiotics was discontinued.  Prior documentation indicated that the goal of care was comfort directed approach - "Patient's mom decided for a comfort approach and and agreed that her main focus is her son's comfort and dignity.  Patient has been on morphine 2 mg every 2 hours scheduled and appears comfortable . Transitioned to morphine drip after discussing with the mother and palliative care.  Palliative care to make adjustments to the morphine drip as per patient's requirement".  01/22/2019: Patient seen alongside patient's mother and patient's nurse that was present briefly.  From my interaction with the patient's mom, the mother seems to  suggest that she was uncertain about goal of care.  Will defer defining the goal of care all over to the palliative care team.  I have also encouraged patient's mother to discuss with palliative care team.  Reviewed lab work and urinalysis ordered by Dr. Karleen Hampshire with the patient's mother.  01/23/2019: Patient seen alongside patient's mother and nurse.  Discussed with the palliative care team extensively.  Palliative care team will meet with patient's mother tomorrow morning.  Goal of care will need to be revisited so I will be on the same page.  01/24/2019: Patient seen alongside patient's nurse and mother.  Patient's mother met with palliative care team earlier today.  Goal of care remains comfort directed measures.  Once more, and communicated to the patient's mother that goals of care will be based discussed with palliative care team only.  No new changes noted  01/25/19: Patient seen with nurse and mom. Mom with lots of questions regarding what else can be done. We discussed lack of meaningful recovery at length.  Assessment & Plan:   Principal Problem:   Severe sepsis ([place]) Active Problems:   Autistic disorder   Intractable nausea and vomiting   Seizure disorder (HCC)   Constipation in male   Obesity, Class III, BMI 40-49.9 (morbid obesity) ([place])   Ileus (HCC)   Aspiration pneumonia ([place])   Community acquired pneumonia of left lower lobe of lung   Goals of care, counseling/discussion   Palliative care by specialist   DNR (do not resuscitate) discussion   Protein-calorie malnutrition, severe   Weakness of lower extremity   Aspiration into airway   Pressure injury of skin   Fever   Diarrhea   Wernicke's encephalopathy   GBS (Guillain Barre syndrome) ([place])   Dysphagia  Do not intubate but use all other measures   Leukocytosis   Sinus tachycardia   Hypotension   Pain   Acute on chronic respiratory failure with hypoxia (HCC)   Dyspnea   Tachypnea   Increased oropharyngeal  secretions  Acute on chronic respiratory failure: Probably secondary to recurrent aspiration pneumonia. Patient completed a course of antibiotics earlier this admission. It appears that patient continues to have aspirate, . Patient is stable on 1 L of nasal cannula oxygen plan to wean him off the oxygen if possible.  No new complaints at this time. 01/24/2019: No new changes.  Patient is comfortable. 01/25/19: weaning O2 may dc sat monitoring  Sinus tachycardia: -Probably multifactorial.  -Patient is stable.   -Patient is also comfortable.  Nutrition:  s/p PEG by IR on 12/8  As per prior documentations-"patient was transitioned to comfort measures by his mother and tube feeds have been stopped.  On further discussing today patient's mother is adamant that that he has aspirated 3 times on tube feeds and does not want him to go back on the vent".  Hypotension: Resolved.   History of Guillain Josefa Half' syndrome: Treated with IVIG x 5 days without significant improvement.   History of chronic epilepsy: Continue with keppra and onfi.  1229: Further care will depend on goal of care  Adynamic ileus: Appears to have resolved Patient has a rectal tube, no bowel movements no stool in the bag.  Bowel sounds are heard. As per RN, some brown discharge leaking from the flexiseal.  01/22/2019: Stable. 01/25/19: removed   Urinary retention:  Foley catheter was replaced.  01/22/2019: No new changes  Hypothyroidism:  Continue with Synthroid  Recent Warnicke encephalopathy: Treated with thiamine without significant improvement.  Severe protein calorie malnutrition:  Not taking anything by mouth, and tube feeds held due to transition to comfort care.   DVT prophylaxis: DNR Code Status: DNR Family Communication: mom at bedside Disposition Plan: ? [facility]--mom to consider   Consultants:   Palliative care  GI  Neurology  CCM  Procedures:   G tube  EEG  A  line  Central line  Intubation Antimicrobials:  Anti-infectives (From admission, onward)   Start     Dose/Rate Route Frequency Ordered Stop   01/13/19 1400  piperacillin-tazobactam (ZOSYN) IVPB 3.375 g  Status:  Discontinued     3.375 g 12.5 mL/hr over 240 Minutes Intravenous Every 8 hours 01/13/19 1357 01/15/19 1100   01/09/19 2215  piperacillin-tazobactam (ZOSYN) IVPB 3.375 g  Status:  Discontinued     3.375 g 12.5 mL/hr over 240 Minutes Intravenous Every 8 hours 01/09/19 2208 01/10/19 1307   12/31/18 1100  Ampicillin-Sulbactam (UNASYN) 3 g in sodium chloride 0.9 % 100 mL IVPB     3 g 200 mL/hr over 30 Minutes Intravenous Every 6 hours 12/31/18 1000 01/05/19 0645   12/22/18 0900  ampicillin-sulbactam (UNASYN) 1.5 g in sodium chloride 0.9 % 100 mL IVPB  Status:  Discontinued     1.5 g 200 mL/hr over 30 Minutes Intravenous Every 6 hours 12/22/18 0851 12/22/18 0857   12/22/18 0900  Ampicillin-Sulbactam (UNASYN) 3 g in sodium chloride 0.9 % 100 mL IVPB     3 g 200 mL/hr over 30 Minutes Intravenous Every 6 hours 12/22/18 0857 12/26/18 2105   12/19/18 1400  erythromycin (EES) 400 MG/5ML suspension 500 mg  Status:  Discontinued     500 mg Per Tube Every 8 hours 12/19/18 1132 12/30/18 1013   12/18/18 1500  erythromycin 500 mg in sodium chloride 0.9 % 100 mL IVPB  Status:  Discontinued     500 mg 100 mL/hr over 60 Minutes Intravenous Every 8 hours 12/18/18 1449 12/19/18 1132   12/15/18 1400  erythromycin (EES) 400 MG/5ML suspension 400 mg  Status:  Discontinued     400 mg Per Tube Every 8 hours 12/15/18 1012 12/15/18 1130   12/15/18 1400  erythromycin 500 mg in sodium chloride 0.9 % 100 mL IVPB  Status:  Discontinued     500 mg 100 mL/hr over 60 Minutes Intravenous Every 8 hours 12/15/18 1130 12/18/18 1449   12/09/18 0600  vancomycin (VANCOCIN) 1,250 mg in sodium chloride 0.9 % 250 mL IVPB  Status:  Discontinued     1,250 mg 166.7 mL/hr over 90 Minutes Intravenous Every 12 hours 11/25/2018  1539 12/10/18 1043   12/09/18 0000  ceFEPIme (MAXIPIME) 2 g in sodium chloride 0.9 % 100 mL IVPB  Status:  Discontinued     2 g 200 mL/hr over 30 Minutes Intravenous Every 8 hours 12/03/2018 1539 12/11/18 1801   12/09/18 0000  metroNIDAZOLE (FLAGYL) IVPB 500 mg  Status:  Discontinued     500 mg 100 mL/hr over 60 Minutes Intravenous Every 8 hours 12/02/2018 2146 12/11/18 1801   12/14/2018 1415  ceFEPIme (MAXIPIME) 2 g in sodium chloride 0.9 % 100 mL IVPB     2 g 200 mL/hr over 30 Minutes Intravenous  Once 11/30/2018 1411 12/22/2018 1538   12/22/2018 1415  metroNIDAZOLE (FLAGYL) IVPB 500 mg     500 mg 100 mL/hr over 60 Minutes Intravenous  Once 11/29/2018 1411 12/22/2018 1609   11/30/2018 1415  vancomycin (VANCOCIN) IVPB 1000 mg/200 mL premix  Status:  Discontinued     1,000 mg 200 mL/hr over 60 Minutes Intravenous  Once 12/01/2018 1411 11/26/2018 1414   11/26/2018 1415  vancomycin (VANCOCIN) 2,500 mg in sodium chloride 0.9 % 500 mL IVPB     2,500 mg 250 mL/hr over 120 Minutes Intravenous  Once 11/30/2018 1414 12/12/2018 1710       Subjective: Mom has noted no change today  Objective: Vitals:   01/25/19 0500 01/25/19 0600 01/25/19 0700 01/25/19 0747  BP:    (!) 107/50  Pulse: (!) 114 (!) 115 (!) 109 (!) 116  Resp: '15 16 15 16  ' Temp:    98.8 F (37.1 C)  TempSrc:    Oral  SpO2: 100% 99% (!) 63% 93%  Weight:      Height:        Intake/Output Summary (Last 24 hours) at 01/25/2019 1739 Last data filed at 01/25/2019 0727 Gross per 24 hour  Intake 819.83 ml  Output 550 ml  Net 269.83 ml   Filed Weights   01/22/19 0500 01/23/19 0500 01/24/19 0500  Weight: 128.6 kg 128.6 kg 129.7 kg    Examination:  General exam: Appears calm and comfortable  Respiratory system: Clear to auscultation. Respiratory effort normal. Cardiovascular system: S1 & S2 heard, RRR. Gastrointestinal system: Abdomen is nondistended, soft and nontender.  Central nervous system: LE flacid, does not respond to stimuli Extremities:  warm.  Data Reviewed: I have personally reviewed following labs and imaging studies  CBC: Recent Labs  Lab 01/21/19 1153  WBC 7.2  NEUTROABS 4.6  HGB 9.6*  HCT 31.8*  MCV 92.7  PLT 696   Basic Metabolic Panel: Recent Labs  Lab 01/21/19 1153  NA 143  K 3.2*  CL 101  CO2 29  GLUCOSE 93  BUN 7  CREATININE <0.30*  CALCIUM 9.2   GFR: CrCl cannot be calculated (This lab value cannot be used to calculate CrCl because it is not a number: <0.30). Liver Function Tests: Recent Labs  Lab 01/21/19 1153  AST 27  ALT 29  ALKPHOS 44  BILITOT 0.7  PROT 6.4*  ALBUMIN 2.8*    Recent Labs  Lab 01/21/19 1153  AMMONIA 17   CBG: Recent Labs  Lab 01/25/19 1619  GLUCAP 81   Urine analysis:    Component Value Date/Time   COLORURINE AMBER (A) 01/21/2019 1215   APPEARANCEUR CLOUDY (A) 01/21/2019 1215   LABSPEC 1.029 01/21/2019 1215   PHURINE 5.0 01/21/2019 1215   GLUCOSEU NEGATIVE 01/21/2019 1215   HGBUR MODERATE (A) 01/21/2019 1215   BILIRUBINUR SMALL (A) 01/21/2019 1215   KETONESUR 20 (A) 01/21/2019 1215   PROTEINUR >=300 (A) 01/21/2019 1215   NITRITE POSITIVE (A) 01/21/2019 1215   LEUKOCYTESUR MODERATE (A) 01/21/2019 1215    Scheduled Meds: . chlorhexidine gluconate (MEDLINE KIT)  15 mL Mouth Rinse BID  . Chlorhexidine Gluconate Cloth  6 each Topical Q0600  . cloBAZam  60 mg Per Tube BID  . LORazepam  1 mg Intravenous Q2H  . mouth rinse  15 mL Mouth Rinse q12n4p  . nystatin   Topical Daily  . sodium chloride flush  10-40 mL Intracatheter Q12H   Continuous Infusions: . levETIRAcetam 1,000 mg (01/25/19 1033)  . morphine 3 mg/hr (01/25/19 0727)     LOS: 59 days    Donnamae Jude, MD [facility] Pager [phone]  If 7PM-7AM, please contact night-coverage [email or web address] Password [facility] 01/25/2019, 5:39 PM

## 2019-01-27 NOTE — Progress Notes (Signed)
Patient;s remains taken to the [place]. Morphine drip wasted in the stericycle by two RNs, Venita Sheffield, Charity fundraiser, witnessed and verified with Lawyer.Patient placement notified.

## 2019-01-27 NOTE — Progress Notes (Signed)
   01/27/19 0000  Clinical Encounter Type  Visited With Patient and family together  Visit Type Death  Referral From Nurse  Consult/Referral To Chaplain  Spiritual Encounters  Spiritual Needs Grief support;Emotional   Chaplain responded to page at [phone] for family support post mortem. Mother and grandparents were very distraught. Chaplain offered emotional support and assistance in finding answers to mother and grandfather's questions. Father arrived and assisted mother in gathering her things for the transition home without Gianni. Chaplain assures the family that we will keep Zoran clean and safe until the funeral home is able to come and transport him to their facility. Chaplain remains available for support as needs arise.   Chaplain Resident, Amado Coe, M Div.  [phone] on-call

## 2019-02-25 NOTE — Progress Notes (Signed)
Patient expired at 23:00 on 01/27/2019. Mother at the bed side. Patient pronounced dead by two registered nurses, Charlsie Quest RN/Heather [place] RN. MD, Dr. Patty Sermons notified. Chaplin notified. Referral/Evaluation For Donation completed at 23:35. Reference number [id number].

## 2019-02-25 NOTE — Death Summary Note (Signed)
DEATH SUMMARY   Patient Details  Name: Aaron Vega MRN: [id number] DOB: August 28, 1995  Admission/Discharge Information   Admit Date:  2019/01/06  Date of Death: Date of Death: February 24, 2019  Time of Death: Time of Death: Jun 03, 2298  Length of Stay: 06/04/2047  Referring Physician: [facility]   Reason(s) for Hospitalization  Severe sepsis with aspiration pneumonia  Diagnoses  Preliminary cause of death: Severe sepsis Secondary Diagnoses (including complications and co-morbidities):  Principal Problem:   Severe sepsis (HCC) Active Problems:   Autistic disorder   Intractable nausea and vomiting   Seizure disorder (HCC)   Constipation in male   Obesity, Class III, BMI 40-49.9 (morbid obesity) (HCC)   Ileus (HCC)   Aspiration pneumonia (HCC)   Community acquired pneumonia of left lower lobe of lung   Goals of care, counseling/discussion   Palliative care by specialist   DNR (do not resuscitate) discussion   Protein-calorie malnutrition, severe   Weakness of lower extremity   Aspiration into airway   Pressure injury of skin   Fever   Diarrhea   Wernicke's encephalopathy   GBS (Guillain Barre syndrome) (HCC)   Dysphagia   Do not intubate but use all other measures   Leukocytosis   Sinus tachycardia   Hypotension   Pain   Acute on chronic respiratory failure with hypoxia (HCC)   Dyspnea   Tachypnea   Increased oropharyngeal secretions   Brief Hospital Course (including significant findings, care, treatment, and services provided and events leading to death)  [name] is a 24 y.o. year old male  with medical history significant of seizures, obesity hypertension constipation and severe outages. Patient was able to have almost a normal life except without his till he has a seizures in August. 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 Since then his health had a progressive decline. Patient currently is under hospice care..  For the last 2 days patient was  more lethargic, had ongoing vomiting which is are not uncommon for him because he has history of constipation over the last few months.  He is not urinating.  He has discomfort with urination/ the hospice nurse thought that he has some crackles in his lungs and he was sent here for further evaluation.  Patient is nonverbal and currently follows some commands Patient was diagnosed with severe sepsis secondary to aspiration pneumonia.  He also had constipation probably with ileus.  He was made DNR and he eventually passed away at 2300 on 2019-02-24   Pertinent Labs and Studies  Significant Diagnostic Studies DG Abd 1 View  Result Date: 12/30/2018 CLINICAL DATA:  Reason for exam: Vomiting, aspiration into airway Hx HTN, pre-diabetes, fatty liver, pancreatitis, pneumonia EXAM: ABDOMEN - 1 VIEW COMPARISON:  12/25/2018 FINDINGS: Feeding tube tip in the distal duodenum. Stomach appears decompressed. Multiple dilated small bowel loops in the mid abdomen, increased since previous. Scattered gas in the nondilated colon. CLINICAL DATA:  Reason for exam: Vomiting, aspiration into airway Hx HTN, pre-diabetes, fatty liver, pancreatitis, pneumonia EXAM: ABDOMEN - 1 VIEW COMPARISON:  12/25/2018 FINDINGS: Feeding tube tip in the distal duodenum. Stomach appears decompressed. Multiple dilated small bowel loops in the mid abdomen, increased since previous. Scattered gas in the nondilated colon. IMPRESSION: 1. Multiple dilated small bowel loops in the mid abdomen, increased since previous, suggesting obstruction or developing ileus. 2. Feeding tube tip in the distal duodenum.  Stomach  decompressed. Electronically Signed   By: Corlis Leak M.D.   On: 12/30/2018 09:43   CT  ABDOMEN PELVIS W CONTRAST  Result Date: 01/08/2019 CLINICAL DATA:  Intestinal dysmotility. Persistent leukocytosis. Decreased stool output. EXAM: CT ABDOMEN AND PELVIS WITH CONTRAST TECHNIQUE: Multidetector CT imaging of the abdomen and pelvis was  performed using the standard protocol following bolus administration of intravenous contrast. CONTRAST:  OMNIPAQUE IOHEXOL 300 MG/ML  SOLN COMPARISON:  December 22, 2018 FINDINGS: Lower chest: The lung bases are clear. The heart size is normal. Hepatobiliary: There is decreased hepatic attenuation suggestive of hepatic steatosis. Normal gallbladder.There is no biliary ductal dilation. Pancreas: Normal contours without ductal dilatation. No peripancreatic fluid collection. Spleen: No splenic laceration or hematoma. Adrenals/Urinary Tract: --Adrenal glands: No adrenal hemorrhage. --Right kidney/ureter: No hydronephrosis or perinephric hematoma. --Left kidney/ureter: No hydronephrosis or perinephric hematoma. --Urinary bladder: The bladder is decompressed with a Foley catheter Stomach/Bowel: --Stomach/Duodenum: There is a new percutaneous gastrostomy tube in place. The tube appears well position. There is some mild fat stranding about the gastrostomy tube tract site without evidence for a well-formed drainable fluid collection. --Small bowel: There are mildly dilated loops of fluid-filled small bowel throughout the abdomen without evidence for high-grade obstruction. --Colon: There is a rectal tube in place. Oral contrast is noted throughout the colon. The colon is relatively decompressed when compared to the prior CT. --Appendix: Normal. 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 The aorta is unremarkable. --No retroperitoneal lymphadenopathy. --No mesenteric lymphadenopathy. --No pelvic or inguinal lymphadenopathy. Reproductive: Unremarkable Other: No ascites or free air. The abdominal wall is normal. Musculoskeletal. No acute displaced fractures. IMPRESSION: 1. There is a new percutaneous gastrostomy tube in place. The tube appears well position. There is some mild fat stranding about the gastrostomy tube tract without evidence for a well-formed drainable fluid collection.  Findings may be secondary to the recent intervention versus a developing soft tissue infection. 2. There are mildly dilated loops of fluid-filled small bowel throughout the abdomen without evidence for high-grade obstruction. Oral contrast is noted throughout the colon. The colon is relatively decompressed when compared to the prior CT. 3. Hepatic steatosis. 4. Incidentally noted duplicated IVC, a normal variant. Electronically Signed   By: Katherine Mantle M.D.   On: 01/08/2019 22:34   IR GASTROSTOMY TUBE MOD SED  Result Date: 01/02/2019 INDICATION: Dysphagia. Please place percutaneous gastrostomy tube for enteric nutrition supplementation purposes. EXAM: PULL TROUGH GASTROSTOMY TUBE PLACEMENT COMPARISON:  CT abdomen pelvis-12/22/2018 MEDICATIONS: Patient is currently admitted to the hospital and receiving intravenous antibiotics; Antibiotics were administered within 1 hour of the procedure. Glucagon 1 mg IV CONTRAST:  30 mL of Isovue-300 administered into the gastric lumen. ANESTHESIA/SEDATION: Moderate (conscious) sedation was employed during this procedure. A total of Versed 1 mg and Fentanyl 50 mcg was administered intravenously. Moderate Sedation Time: 10 minutes. The patient's level of consciousness and vital signs were monitored continuously by radiology nursing throughout the procedure under my direct supervision. FLUOROSCOPY TIME:  3 minutes, 30 seconds (89 mGy) COMPLICATIONS: None immediate. PROCEDURE: Informed written consent was obtained from the patient following explanation of the procedure, risks, benefits and alternatives. A time out was performed prior to the initiation of the procedure. Ultrasound scanning was performed to demarcate the edge of the left lobe of the liver. Maximal barrier sterile technique utilized including caps, mask, sterile gowns, sterile gloves, large sterile drape, hand hygiene and Betadine prep. The left upper quadrant was sterilely prepped and draped. An oral gastric  catheter was inserted into the stomach under fluoroscopy. The existing nasogastric feeding tube was removed. The left  costal margin and barium/air opacified transverse colon were identified and avoided. Air was injected into the stomach for insufflation and visualization under fluoroscopy. Under sterile conditions a 17 gauge trocar needle was utilized to access the stomach percutaneously beneath the left subcostal margin after the overlying soft tissues were anesthetized with 1% Lidocaine with epinephrine. Needle position was confirmed within the stomach with aspiration of air and injection of small amount of contrast. A single T tack was deployed for gastropexy. Over an Amplatz guide wire, a 9-French sheath was inserted into the stomach. A snare device was utilized to capture the oral gastric catheter. The snare device was pulled retrograde from the stomach up the esophagus and out the oropharynx. The 20-French pull-through gastrostomy was connected to the snare device and pulled antegrade through the oropharynx down the esophagus into the stomach and then through the percutaneous tract external to the patient. The gastrostomy was assembled externally. Contrast injection confirms position in the stomach. Several spot radiographic images were obtained in various obliquities for documentation. The patient tolerated procedure well without immediate post procedural complication. FINDINGS: After successful fluoroscopic guided placement, the gastrostomy tube is appropriately positioned with internal disc against the ventral aspect of the gastric lumen. IMPRESSION: Successful fluoroscopic insertion of a 20-French pull-through gastrostomy tube. The gastrostomy may be used immediately for medication administration and in 24 hrs for the initiation of feeds. Electronically Signed   By: Sandi Mariscal M.D.   On: 01/02/2019 10:14   DG CHEST PORT 1 VIEW  Result Date: 01/09/2019 CLINICAL DATA:  Shortness of breath EXAM:  PORTABLE CHEST 1 VIEW COMPARISON:  01/05/2019 FINDINGS: Right-sided PICC line remains in place with distal tip terminating the level of the right atrium. Stable cardiomediastinal contours. Lung volumes are low. Mild streaky left basilar opacity, new from prior. Right lung is clear. No pleural effusion or pneumothorax. IMPRESSION: Mild streaky left basilar opacity, new from prior, could reflect atelectasis or infiltrate. Electronically Signed   By: Davina Poke M.D.   On: 01/09/2019 20:40   DG CHEST PORT 1 VIEW  Result Date: 01/05/2019 CLINICAL DATA:  Hypoxia, fever. EXAM: PORTABLE CHEST 1 VIEW COMPARISON:  December 31, 2018. FINDINGS: The heart size and mediastinal contours are within normal limits. Both lungs are clear. Right-sided PICC line is noted with tip in expected position of right atrium. No pneumothorax or pleural effusion is noted. The visualized skeletal structures are unremarkable. IMPRESSION: No active disease. Electronically Signed   By: Marijo Conception M.D.   On: 01/05/2019 11:25   DG CHEST PORT 1 VIEW  Result Date: 12/31/2018 CLINICAL DATA:  24 year old male with a history aspiration EXAM: PORTABLE CHEST 1 VIEW COMPARISON:  12/30/2018, 12/28/2018 FINDINGS: Cardiomediastinal silhouette unchanged in size and contour. Low lung volumes persist with linear opacities at the lung bases. No pneumothorax or large pleural effusion. Crowding of the interstitium and the vasculature. Enteric tube projects over the mediastinum and terminates out of the field of view. IMPRESSION: Unchanged appearance of the chest x-ray with low lung volumes and likely atelectasis. Unchanged enteric feeding tube. Electronically Signed   By: Corrie Mckusick D.O.   On: 12/31/2018 07:45   DG CHEST PORT 1 VIEW  Result Date: 12/30/2018 CLINICAL DATA:  Reason for exam: Vomiting, aspiration into airway Hx HTN, pre-diabetes, fatty liver, pancreatitis, pneumonia EXAM: PORTABLE CHEST - 1 VIEW COMPARISON:  12/28/2018 FINDINGS:  Feeding tube remains in place. Persistent low lung volumes with coarse perihilar and bibasilar interstitial markings. No new infiltrate or edema. Heart  size and mediastinal contours are within normal limits. No effusion. Visualized bones unremarkable. IMPRESSION: 1. Stable chest x-ray. No new infiltrate or edema. 2. Stable appearance of the cardiomediastinal silhouette. Electronically Signed   By: Corlis Leak M.D.   On: 12/30/2018 09:42   DG CHEST PORT 1 VIEW  Result Date: 12/28/2018 CLINICAL DATA:  Updated status.  Fever. EXAM: PORTABLE CHEST 1 VIEW COMPARISON:  December 26, 2018 FINDINGS: The feeding tube terminates below today's film. Mild opacity in left base is somewhat streaky in platelike laterally. Low lung volumes. No pneumothorax. The lungs are otherwise clear. The cardiomediastinal silhouette is stable. IMPRESSION: Streaky opacity in left base is favored to represent atelectasis given the platelike configuration laterally. Developing infiltrate not completely excluded. Recommend attention on follow-up. Electronically Signed   By: Gerome Sam III M.D   On: 12/28/2018 21:44   DG Abd Portable 1V  Result Date: 01/08/2019 CLINICAL DATA:  Gastrostomy tube. No bowel movement for several days. History of ileus. Tachycardia. EXAM: PORTABLE ABDOMEN - 1 VIEW COMPARISON:  01/02/2019 abdominal radiograph. FINDINGS: No dilated small bowel loops. Retained oral contrast noted throughout the colon, with interval transit of oral contrast at least to the sigmoid colon. Mild-to-moderate stool and gas in the colon. No evidence of pneumatosis or pneumoperitoneum. No radiopaque nephrolithiasis. Gastrostomy tube not well visualized on these radiographs presumably obscured by retained oral contrast in the colon. IMPRESSION: Nonobstructive bowel gas pattern. Retained oral contrast throughout the colon. Mild-to-moderate colonic stool and gas. Electronically Signed   By: Delbert Phenix M.D.   On: 01/08/2019 14:26   DG Abd  Portable 1V  Result Date: 01/02/2019 CLINICAL DATA:  Severe sepsis. EXAM: PORTABLE ABDOMEN - 1 VIEW COMPARISON:  12/30/2018 FINDINGS: Air is present throughout the colon. Contrast is present over the right colon, terminal ileum and appendix. Small caliber probe projects over the rectum. Enteric tube is present with tip in the midline of the mid abdomen likely over the distal duodenum near the duodenal jejunal junction. Remainder the exam is unchanged IMPRESSION: 1.  Nonobstructive bowel gas pattern as described. 2. Enteric tube with tip unchanged just left of midline in the upper abdomen likely over the third/fourth portion of the duodenum. Electronically Signed   By: Elberta Fortis M.D.   On: 01/02/2019 08:03   DG Abd Portable 1V  Result Date: 12/30/2018 CLINICAL DATA:  Ileus EXAM: PORTABLE ABDOMEN - 1 VIEW COMPARISON:  Portable exam 1715 hours compared to 0906 hours FINDINGS: Tip of feeding tube projects over the third portion of duodenum approaching ligament of Treitz. Scattered gas throughout bowel, decreased from prior exam. No bowel dilatation or bowel wall thickening. Osseous structures unremarkable. No urinary tract calcifications. IMPRESSION: Nonspecific bowel gas pattern. Decreased gaseous distention of bowel since prior study. Electronically Signed   By: Ulyses Southward M.D.   On: 12/30/2018 19:20   [place] EKG SITE RITE  Result Date: 12/31/2018 If Site Rite image not attached, placement could not be confirmed due to current cardiac rhythm.   Microbiology No results found for this or any previous visit (from the past 240 hour(s)).  Lab Basic Metabolic Panel: Recent Labs  Lab 01/21/19 1153  NA 143  K 3.2*  CL 101  CO2 29  GLUCOSE 93  BUN 7  CREATININE <0.30*  CALCIUM 9.2   Liver Function Tests: Recent Labs  Lab 01/21/19 1153  AST 27  ALT 29  ALKPHOS 44  BILITOT 0.7  PROT 6.4*  ALBUMIN 2.8*   No results for  input(s): LIPASE, AMYLASE in the last 168 hours. Recent Labs  Lab  01/21/19 1153  AMMONIA 17   CBC: Recent Labs  Lab 01/21/19 1153  WBC 7.2  NEUTROABS 4.6  HGB 9.6*  HCT 31.8*  MCV 92.7  PLT 296   Cardiac Enzymes: No results for input(s): CKTOTAL, CKMB, CKMBINDEX, TROPONINI in the last 168 hours. Sepsis Labs: Recent Labs  Lab 01/21/19 1153  PROCALCITON 0.10  WBC 7.2  LATICACIDVEN 0.7    Procedures/Operations  None   Myrtie NeitherNwannadiya Cavon Nicolls 01/27/2019, 7:25 PM

## 2019-02-25 NOTE — Progress Notes (Addendum)
PROGRESS NOTE  Aaron Vega VFI:[id number] DOB: 05-Apr-1995 DOA: 12/04/2018 PCP: [facility]  [facility]  HPI/Recap of past 82 hours: 24 year old Caucasian male morbid obesity with prior documented medical history significant for Tourette's syndrome, sleep apnea, seizure disorder, prediabetes, pneumonia, pancreatitis, hypertension, fatty liver and autism who is being followed at [facility] neurology patient is nonverbal at baseline with marked cognitive decline and gait dysfunction and severe obstipation.  Patient is currently DNR with comfort care only.  Mother does not want him moved from progressive care.  She wants him to be cared for here otherwise she would not rescind the DNR.  Palliative care is actively involved.  He is currently on morphine drip at 5 mg he currently has a Foley and PEG tube.  Nurse reported temperature of 100.2 this morning also has a decubitus ulcer  02-15-19 Subjective Patient seen at bedside examined at bedside mother [name] is at bedside.  As I walked into the room she was Suctioning the patient ,patient is nonresponsive.  Assessment/Plan: Principal Problem:   Severe sepsis (HCC) Active Problems:   Autistic disorder   Intractable nausea and vomiting   Seizure disorder (HCC)   Constipation in male   Obesity, Class III, BMI 40-49.9 (morbid obesity) ([place])   Ileus (HCC)   Aspiration pneumonia ([place])   Community acquired pneumonia of left lower lobe of lung   Goals of care, counseling/discussion   Palliative care by specialist   DNR (do not resuscitate) discussion   Protein-calorie malnutrition, severe   Weakness of lower extremity   Aspiration into airway   Pressure injury of skin   Fever   Diarrhea   Wernicke's encephalopathy   GBS (Guillain Barre syndrome) ([place])   Dysphagia   Do not intubate but use all other measures   Leukocytosis   Sinus tachycardia   Hypotension   Pain   Acute on chronic respiratory failure with hypoxia  (HCC)   Dyspnea   Tachypnea   Increased oropharyngeal secretions #1 acute on chronic respiratory failure 2.  Sinus tachycardia 3.  Poor nutrition continue PEG tube feeding  4.Guillain-Barr syndrome by history did not improve with treatment  5.History of chronic epilepsy continue Keppra  Code Status: DNR  Severity of Illness: The appropriate patient status for this patient is INPATIENT. Inpatient status is judged to be reasonable and necessary in order to provide the required intensity of service to ensure the patient's safety. The patient's presenting symptoms, physical exam findings, and initial radiographic and laboratory data in the context of their chronic comorbidities is felt to place them at high risk for further clinical deterioration. Furthermore, it is not anticipated that the patient will be medically stable for discharge from the hospital within 2 midnights of admission. The following factors support the patient status of inpatient.  Comfort care measures* I certify that at the point of admission it is my clinical judgment that the patient will require inpatient hospital care spanning beyond 2 midnights from the point of admission due to high intensity of service, high risk for further deterioration and high frequency of surveillance required.*    Family Communication: Mother at bedside  Disposition Plan: To be determined   Consultants:     Procedures:     Antimicrobials:     DVT prophylaxis:     Objective: Vitals:   02/15/2019 0809 February 15, 2019 0827 15-Feb-2019 0925 Feb 15, 2019 1055  BP:      Pulse:    (!) 138  Resp:    14  Temp:  Marland Kitchen)  102.3 F (39.1 C)    TempSrc:  Axillary    SpO2: 92%  (!) 86% 90%  Weight:      Height:        Intake/Output Summary (Last 24 hours) at February 19, 2019 1906 Last data filed at 2019-02-19 0800 Gross per 24 hour  Intake 151.45 ml  Output 350 ml  Net -198.55 ml   Filed Weights   01/22/19 0500 01/23/19 0500 01/24/19 0500  Weight:  128.6 kg 128.6 kg 129.7 kg   Body mass index is 39.88 kg/m.  Exam:  . General: 24 y.o. year-old male well developed well nourished, obese in no acute distress.  Nonresponsive . Cardiovascular: Regular rate and rhythm with no rubs or gallops.  No thyromegaly or JVD noted.   Marland Kitchen Respiratory: Gurgling respiration , no respiratory distress . Abdomen: Soft nontender nondistended with normal bowel sounds x4 quadrants. . Musculoskeletal: No lower extremity edema. 2/4 pulses in all 4 extremities. . Skin: No ulcerative lesions noted or rashes, . Psychiatry: Nonresponsive    Data Reviewed: CBC: Recent Labs  Lab 01/21/19 1153  WBC 7.2  NEUTROABS 4.6  HGB 9.6*  HCT 31.8*  MCV 92.7  PLT 161   Basic Metabolic Panel: Recent Labs  Lab 01/21/19 1153  NA 143  K 3.2*  CL 101  CO2 29  GLUCOSE 93  BUN 7  CREATININE <0.30*  CALCIUM 9.2   GFR: CrCl cannot be calculated (This lab value cannot be used to calculate CrCl because it is not a number: <0.30). Liver Function Tests: Recent Labs  Lab 01/21/19 1153  AST 27  ALT 29  ALKPHOS 44  BILITOT 0.7  PROT 6.4*  ALBUMIN 2.8*   No results for input(s): LIPASE, AMYLASE in the last 168 hours. Recent Labs  Lab 01/21/19 1153  AMMONIA 17   Coagulation Profile: No results for input(s): INR, PROTIME in the last 168 hours. Cardiac Enzymes: No results for input(s): CKTOTAL, CKMB, CKMBINDEX, TROPONINI in the last 168 hours. BNP (last 3 results) No results for input(s): PROBNP in the last 8760 hours. HbA1C: No results for input(s): HGBA1C in the last 72 hours. CBG: Recent Labs  Lab 01/25/19 1619  GLUCAP 81   Lipid Profile: No results for input(s): CHOL, HDL, LDLCALC, TRIG, CHOLHDL, LDLDIRECT in the last 72 hours. Thyroid Function Tests: No results for input(s): TSH, T4TOTAL, FREET4, T3FREE, THYROIDAB in the last 72 hours. Anemia Panel: No results for input(s): VITAMINB12, FOLATE, FERRITIN, TIBC, IRON, RETICCTPCT in the last 72  hours. Urine analysis:    Component Value Date/Time   COLORURINE AMBER (A) 01/21/2019 1215   APPEARANCEUR CLOUDY (A) 01/21/2019 1215   LABSPEC 1.029 01/21/2019 1215   PHURINE 5.0 01/21/2019 1215   GLUCOSEU NEGATIVE 01/21/2019 1215   HGBUR MODERATE (A) 01/21/2019 1215   BILIRUBINUR SMALL (A) 01/21/2019 1215   KETONESUR 20 (A) 01/21/2019 1215   PROTEINUR >=300 (A) 01/21/2019 1215   NITRITE POSITIVE (A) 01/21/2019 1215   LEUKOCYTESUR MODERATE (A) 01/21/2019 1215   Sepsis Labs: _0 (procalcitonin:4,lacticidven:4)  )No results found for this or any previous visit (from the past 240 hour(s)).    Studies: No results found.  Scheduled Meds: . chlorhexidine gluconate (MEDLINE KIT)  15 mL Mouth Rinse BID  . Chlorhexidine Gluconate Cloth  6 each Topical Q0600  . cloBAZam  60 mg Per Tube BID  . LORazepam  1 mg Intravenous Q2H  . mouth rinse  15 mL Mouth Rinse q12n4p  . nystatin   Topical Daily  . sodium  chloride flush  10-40 mL Intracatheter Q12H    Continuous Infusions: . levETIRAcetam 1,000 mg (02-07-19 1053)  . morphine 5 mg/hr (07-Feb-2019 1517)     LOS: 42 days     Cristal Deer, MD [facility]  To reach me or the doctor on call, go to: [email or web address] Password [facility]  Feb 07, 2019, 7:06 PM

## 2019-02-25 DEATH — deceased
